# Patient Record
Sex: Male | Born: 1970 | Race: Black or African American | Hispanic: No | Marital: Single | Smoking: Former smoker
Health system: Southern US, Community
[De-identification: ages and names within clinical notes are randomized; demographics above are authoritative.]

## PROBLEM LIST (undated history)

## (undated) DIAGNOSIS — C7800 Secondary malignant neoplasm of unspecified lung: Secondary | ICD-10-CM

## (undated) DIAGNOSIS — C642 Malignant neoplasm of left kidney, except renal pelvis: Secondary | ICD-10-CM

## (undated) HISTORY — PX: APPENDECTOMY: SHX54

---

## 1998-05-24 ENCOUNTER — Emergency Department (HOSPITAL_COMMUNITY): Admission: EM | Admit: 1998-05-24 | Discharge: 1998-05-24 | Payer: Self-pay | Admitting: Emergency Medicine

## 1999-01-30 ENCOUNTER — Emergency Department (HOSPITAL_COMMUNITY): Admission: EM | Admit: 1999-01-30 | Discharge: 1999-01-30 | Payer: Self-pay | Admitting: Emergency Medicine

## 1999-07-16 ENCOUNTER — Emergency Department (HOSPITAL_COMMUNITY): Admission: EM | Admit: 1999-07-16 | Discharge: 1999-07-16 | Payer: Self-pay | Admitting: Emergency Medicine

## 2000-10-01 ENCOUNTER — Other Ambulatory Visit: Admission: RE | Admit: 2000-10-01 | Discharge: 2000-10-01 | Payer: Self-pay | Admitting: Urology

## 2000-10-01 ENCOUNTER — Encounter (INDEPENDENT_AMBULATORY_CARE_PROVIDER_SITE_OTHER): Payer: Self-pay

## 2003-08-20 ENCOUNTER — Emergency Department (HOSPITAL_COMMUNITY): Admission: EM | Admit: 2003-08-20 | Discharge: 2003-08-20 | Payer: Self-pay | Admitting: Emergency Medicine

## 2003-08-22 ENCOUNTER — Emergency Department (HOSPITAL_COMMUNITY): Admission: EM | Admit: 2003-08-22 | Discharge: 2003-08-23 | Payer: Self-pay | Admitting: Emergency Medicine

## 2003-08-23 IMAGING — CR DG CHEST 2V
2 series · 2 of 2 positions shown · non-contrast
Comparison: None.

CLINICAL DATA: Chest pain, back pain.
 TWO VIEW CHEST ? [DATE]

[view not recorded (1 of 2)]
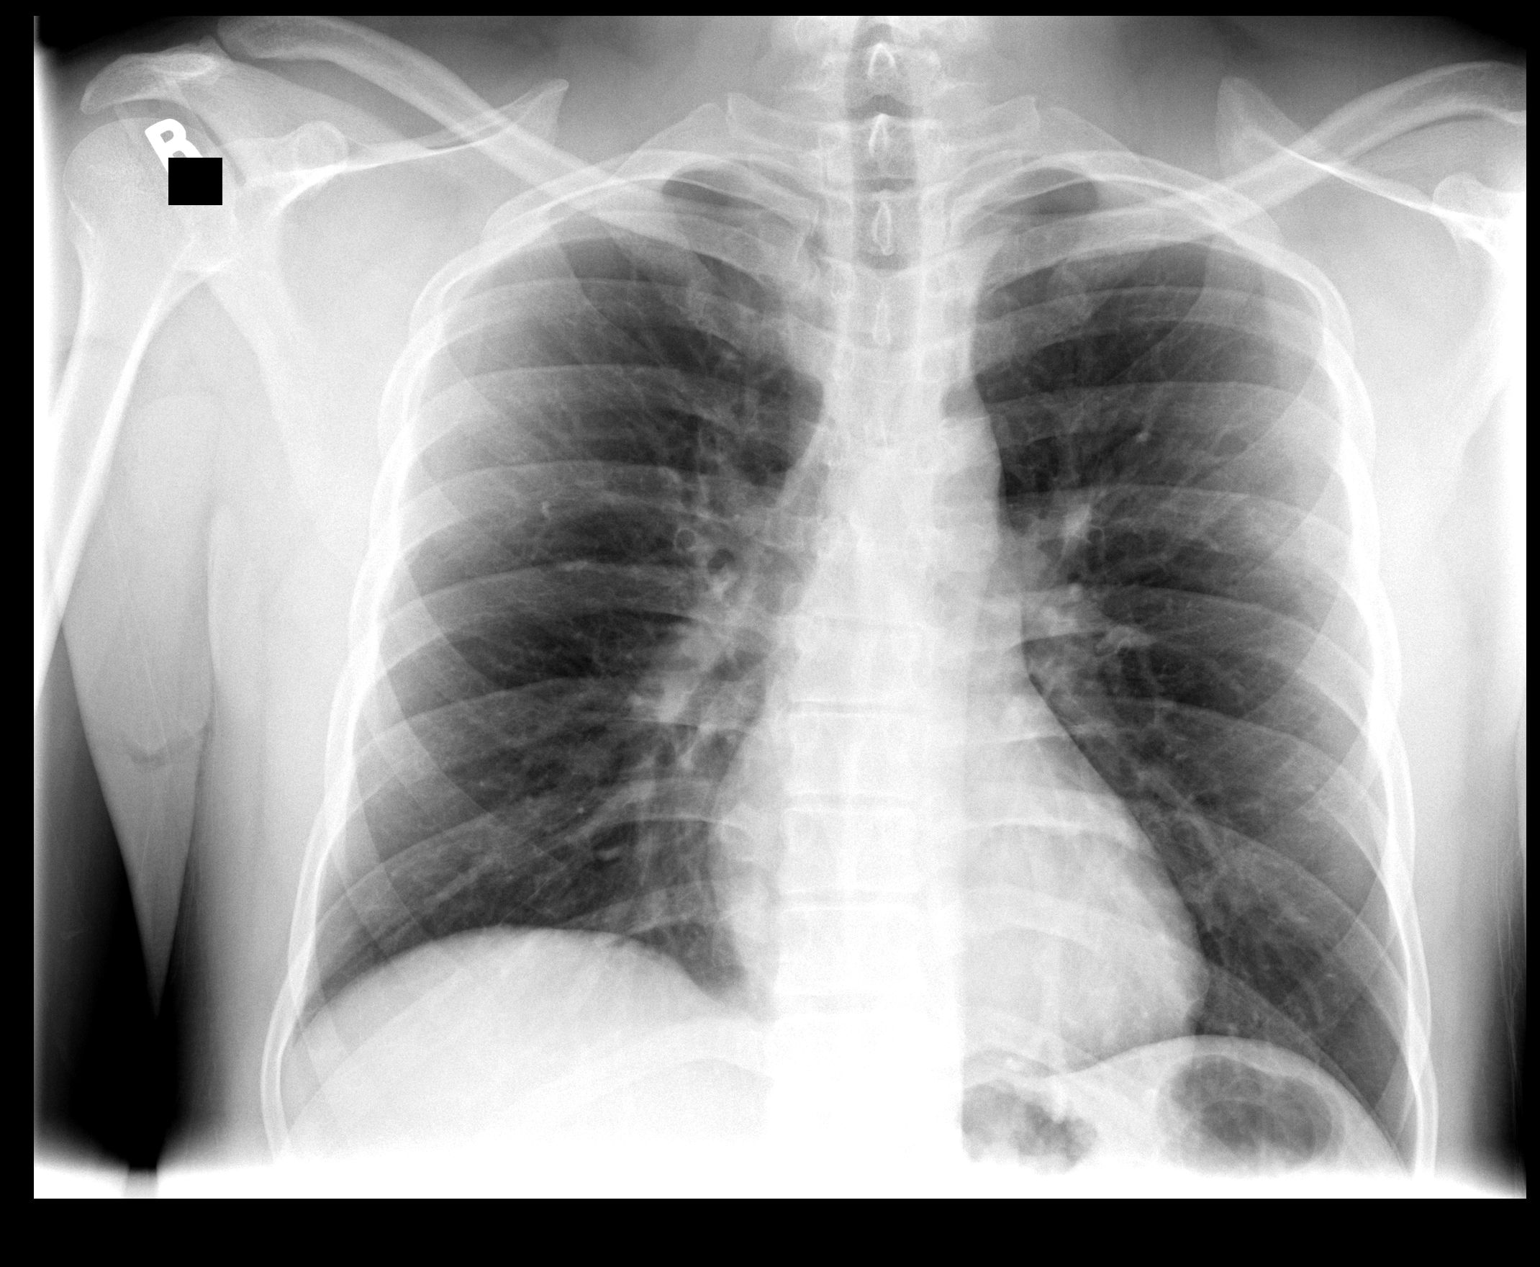

[view not recorded (2 of 2)]
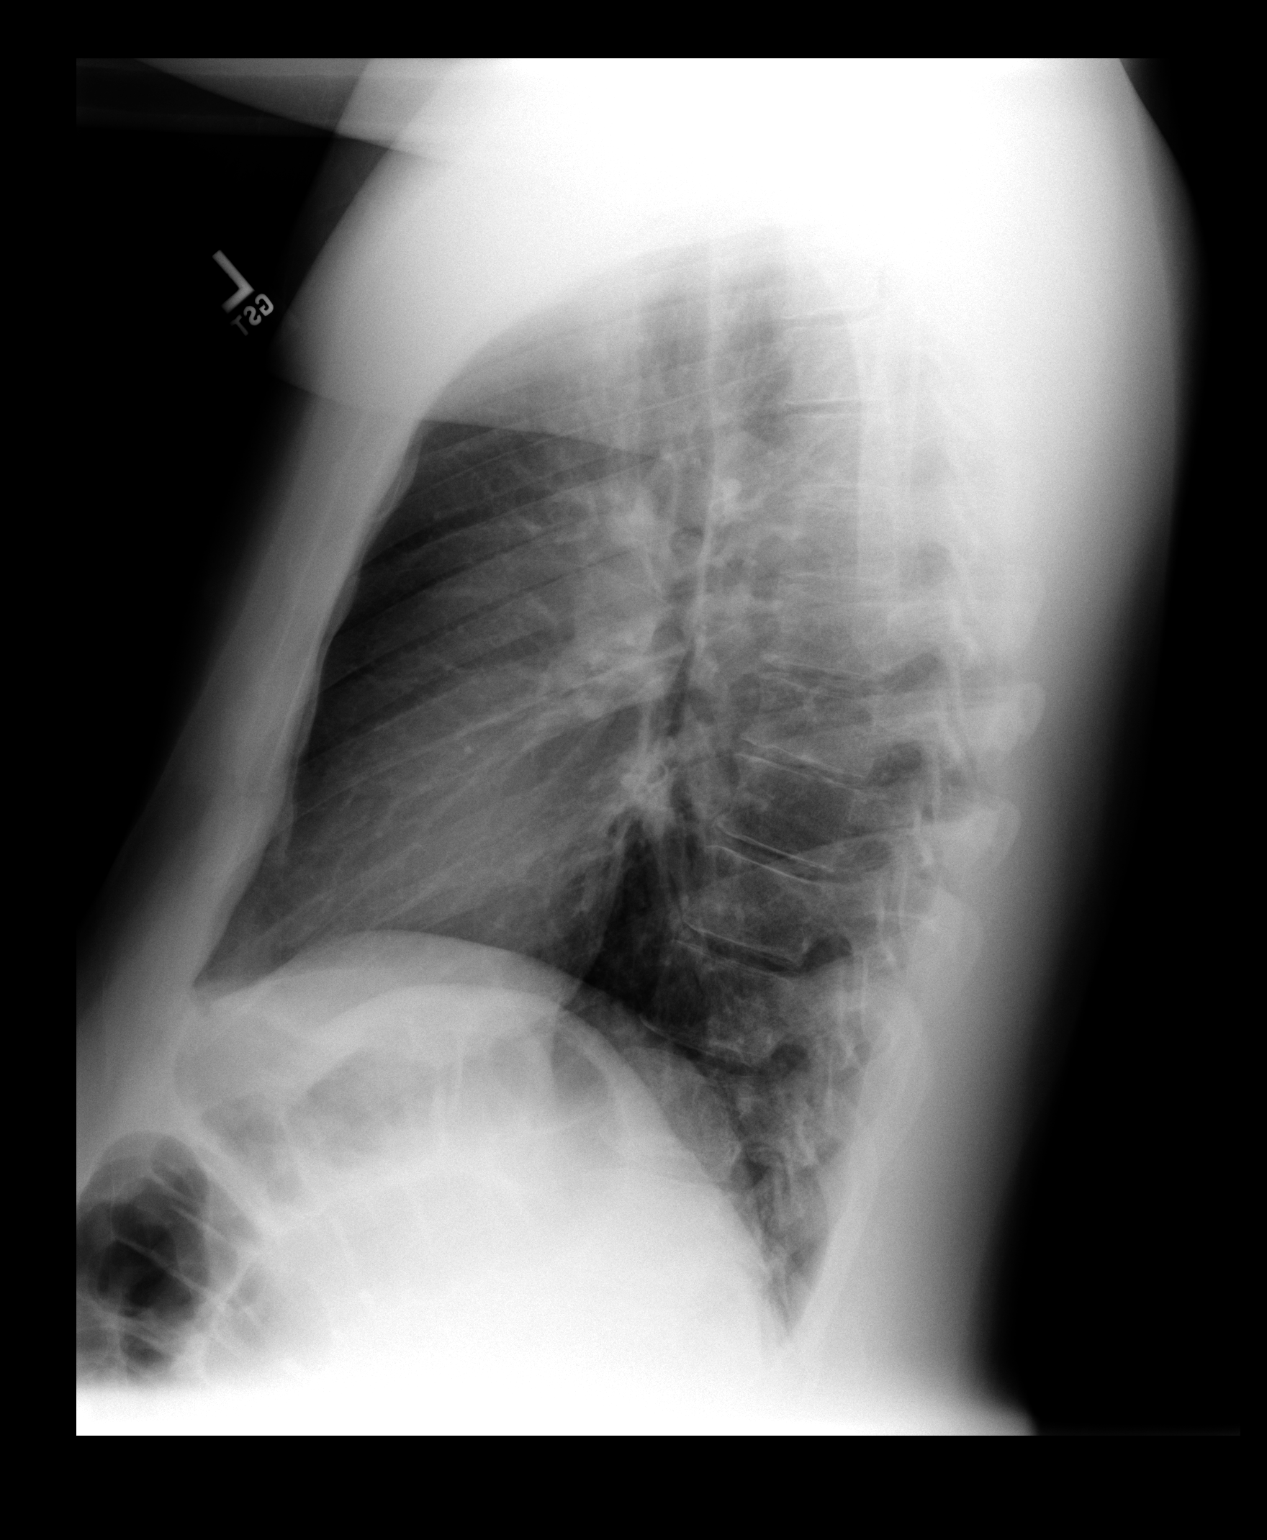

[2 of 2 positions shown; findings below may reference images not displayed]

The heart size and mediastinal contours are normal. The lungs are clear. The visualized skeleton is unremarkable.

 IMPRESSION
 No active disease.

## 2004-02-25 ENCOUNTER — Emergency Department (HOSPITAL_COMMUNITY): Admission: EM | Admit: 2004-02-25 | Discharge: 2004-02-25 | Payer: Self-pay | Admitting: *Deleted

## 2004-08-08 ENCOUNTER — Emergency Department (HOSPITAL_COMMUNITY): Admission: EM | Admit: 2004-08-08 | Discharge: 2004-08-08 | Payer: Self-pay | Admitting: Family Medicine

## 2005-05-13 ENCOUNTER — Emergency Department (HOSPITAL_COMMUNITY): Admission: EM | Admit: 2005-05-13 | Discharge: 2005-05-13 | Payer: Self-pay | Admitting: Emergency Medicine
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
## 2007-05-24 ENCOUNTER — Emergency Department (HOSPITAL_COMMUNITY): Admission: EM | Admit: 2007-05-24 | Discharge: 2007-05-24 | Payer: Self-pay | Admitting: Emergency Medicine

## 2011-12-23 ENCOUNTER — Emergency Department (HOSPITAL_COMMUNITY)
Admission: EM | Admit: 2011-12-23 | Discharge: 2011-12-23 | Disposition: A | Discharge status: Home / Self Care | Payer: Self-pay | Attending: Emergency Medicine | Admitting: Emergency Medicine

## 2011-12-23 ENCOUNTER — Emergency Department (HOSPITAL_COMMUNITY): Payer: Self-pay

## 2011-12-23 ENCOUNTER — Encounter (HOSPITAL_COMMUNITY): Payer: Self-pay | Admitting: *Deleted

## 2011-12-23 DIAGNOSIS — F17200 Nicotine dependence, unspecified, uncomplicated: Secondary | ICD-10-CM | POA: Insufficient documentation

## 2011-12-23 DIAGNOSIS — R059 Cough, unspecified: Secondary | ICD-10-CM | POA: Insufficient documentation

## 2011-12-23 DIAGNOSIS — R05 Cough: Secondary | ICD-10-CM | POA: Insufficient documentation

## 2011-12-23 DIAGNOSIS — J069 Acute upper respiratory infection, unspecified: Secondary | ICD-10-CM

## 2011-12-23 IMAGING — CR DG CHEST 2V
2 series · 2 of 2 positions shown · non-contrast
Comparison: [DATE]

CLINICAL DATA: Cough, smoker.

CHEST - 2 VIEW

[w chest pa]
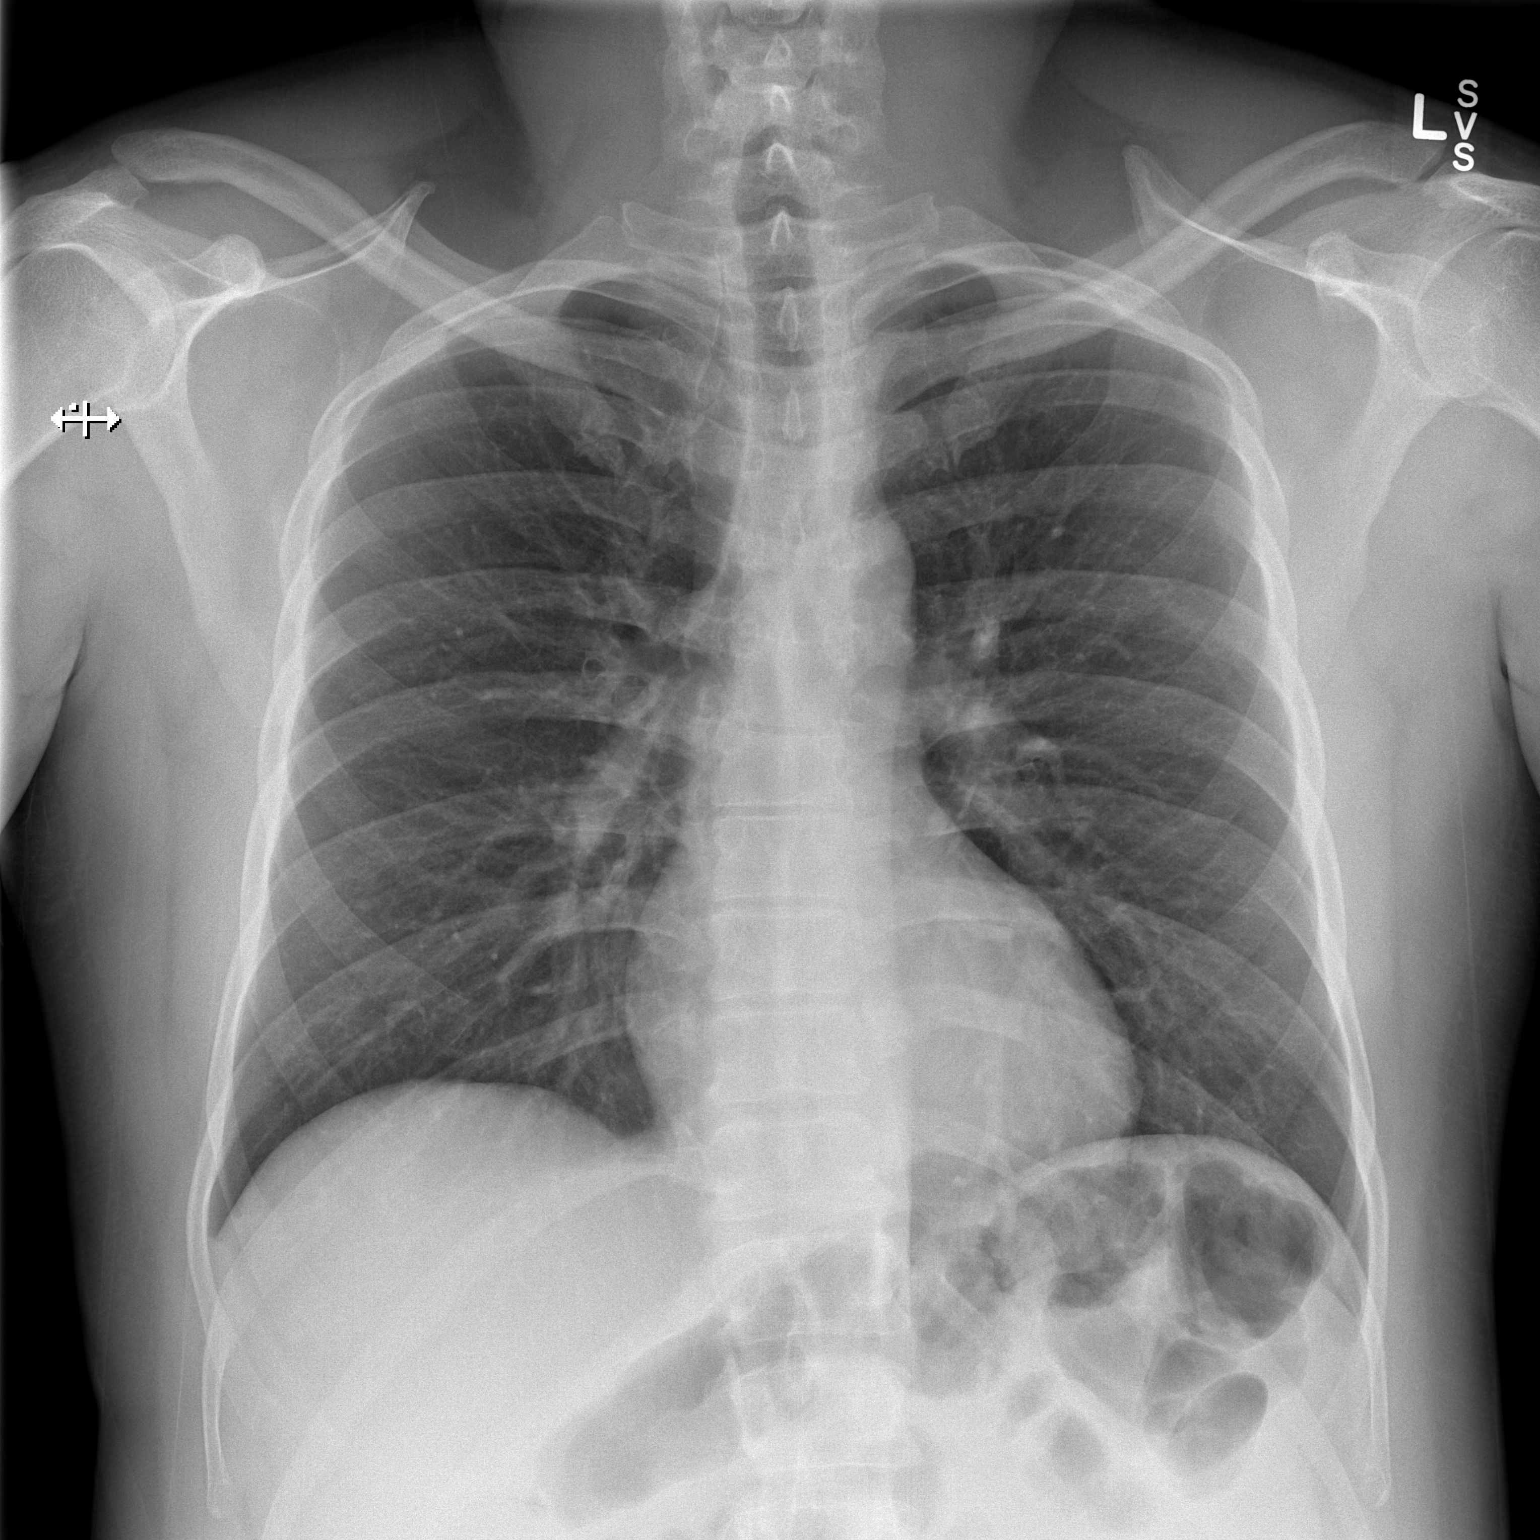

[w chest lat]
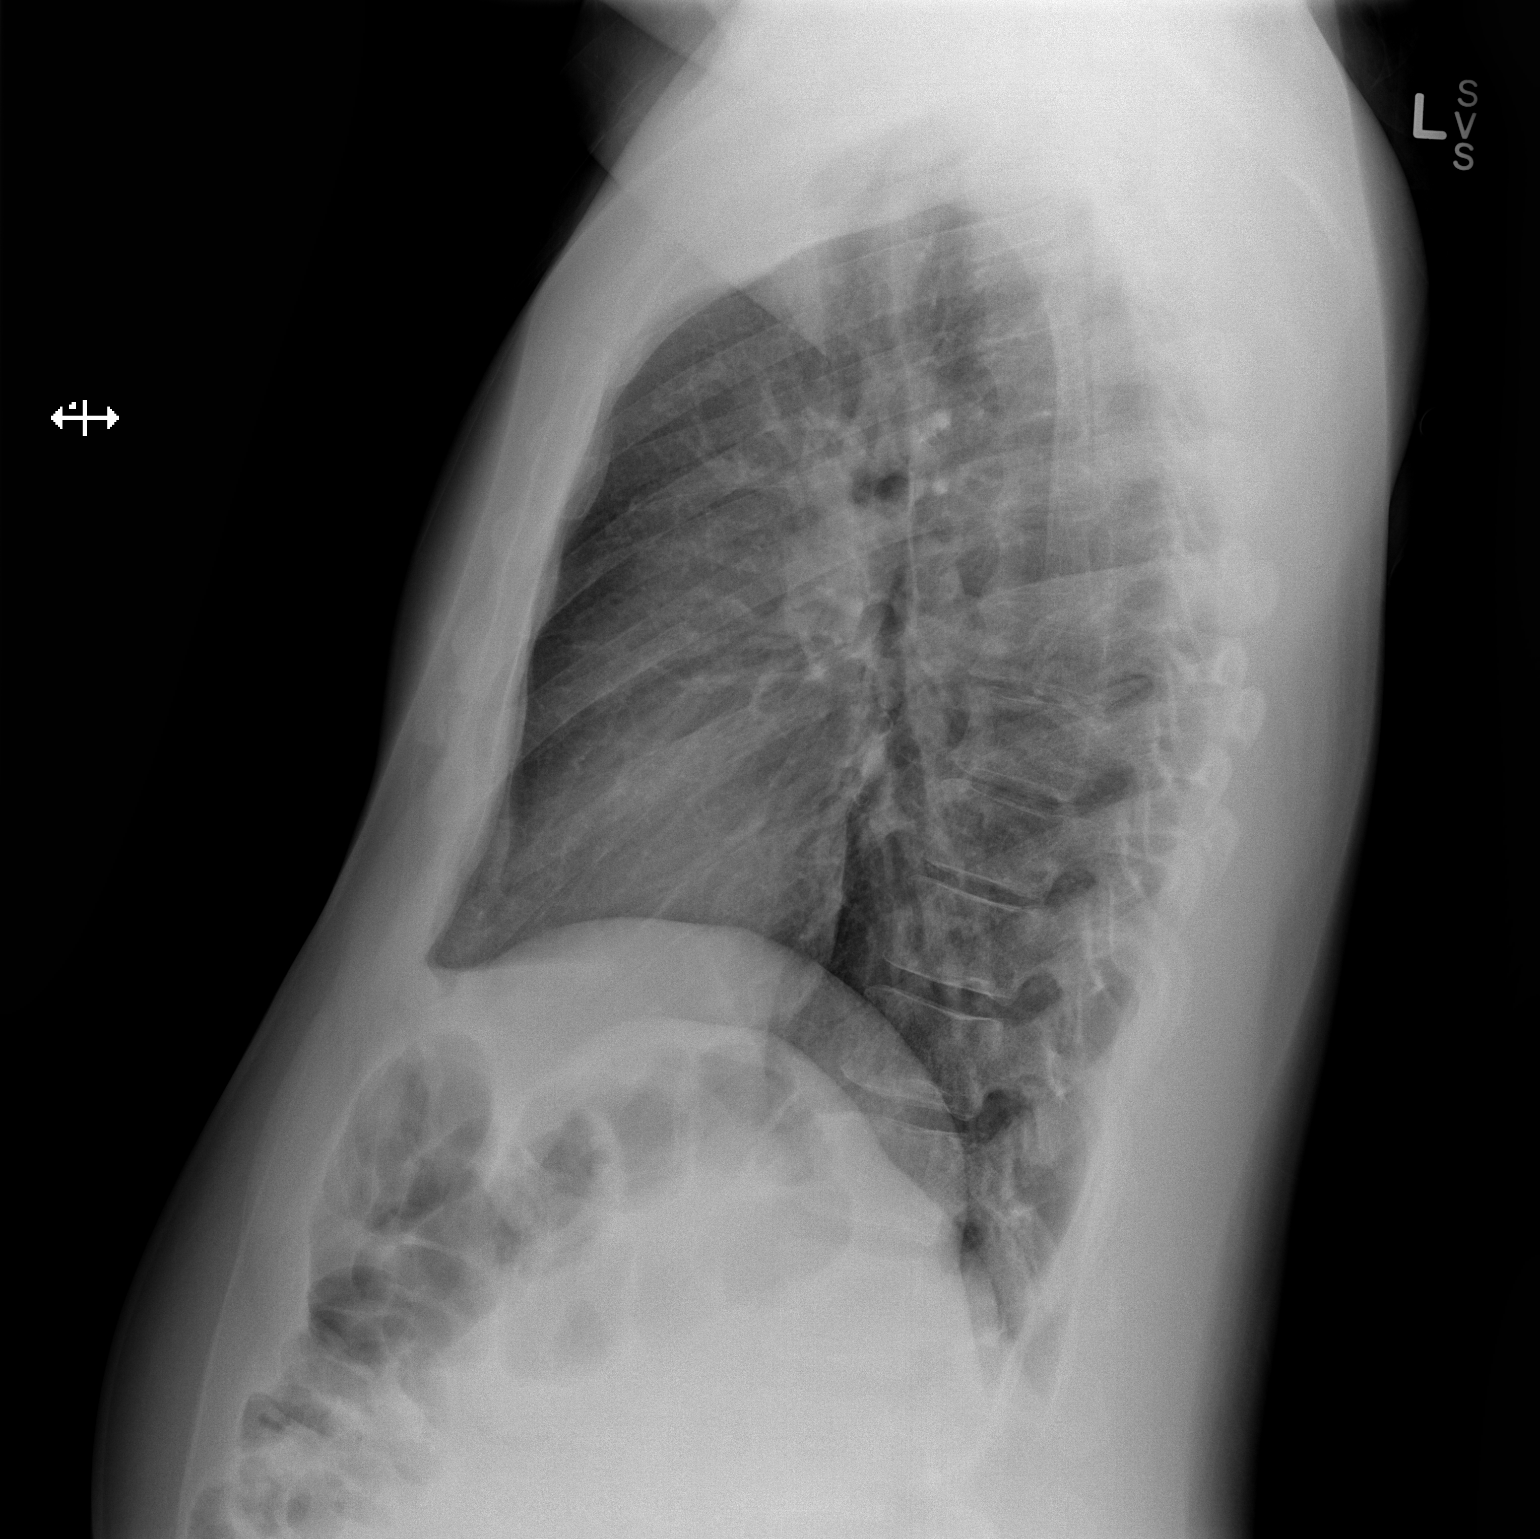

[2 of 2 positions shown; findings below may reference images not displayed]

FINDINGS: Heart and mediastinal contours are within normal limits.
No focal opacities or effusions.  No acute bony abnormality.
IMPRESSION: No active cardiopulmonary disease.

## 2011-12-23 MED ORDER — HYDROCOD POLST-CHLORPHEN POLST 10-8 MG/5ML PO LQCR: 5.0000 mL | Freq: Two times a day (BID) | ORAL | Status: DC | PRN

## 2011-12-23 NOTE — ED Notes (Signed)
Per reports cough x4-5, clear sputum, denies fever - pt admits to hx of smoking and bronchitis. LS clear and equal bilat. Pt also admits to rib pain, describes as sore w/ coughing.

## 2011-12-23 NOTE — Discharge Instructions (Signed)
Antibiotic Nonuse  Your caregiver felt that the infection or problem was not one that would be helped with an antibiotic. Infections may be caused by viruses or bacteria. Only a caregiver can tell which one of these is the likely cause of an illness. A cold is the most common cause of infection in both adults and children. A cold is a virus. Antibiotic treatment will have no effect on a viral infection. Viruses can lead to many lost days of work caring for sick children and many missed days of school. Children may catch as many as 10 "colds" or "flus" per year during which they can be tearful, cranky, and uncomfortable. The goal of treating a virus is aimed at keeping the ill person comfortable. Antibiotics are medications used to help the body fight bacterial infections. There are relatively few types of bacteria that cause infections but there are hundreds of viruses. While both viruses and bacteria cause infection they are very different types of germs. A viral infection will typically go away by itself within 7 to 10 days. Bacterial infections may spread or get worse without antibiotic treatment. Examples of bacterial infections are:  Sore throats (like strep throat or tonsillitis).   Infection in the lung (pneumonia).   Ear and skin infections.  Examples of viral infections are:  Colds or flus.   Most coughs and bronchitis.   Sore throats not caused by Strep.   Runny noses.  It is often best not to take an antibiotic when a viral infection is the cause of the problem. Antibiotics can kill off the helpful bacteria that we have inside our body and allow harmful bacteria to start growing. Antibiotics can cause side effects such as allergies, nausea, and diarrhea without helping to improve the symptoms of the viral infection. Additionally, repeated uses of antibiotics can cause bacteria inside of our body to become resistant. That resistance can be passed onto harmful bacterial. The next time  you have an infection it may be harder to treat if antibiotics are used when they are not needed. Not treating with antibiotics allows our own immune system to develop and take care of infections more efficiently. Also, antibiotics will work better for us when they are prescribed for bacterial infections. Treatments for a child that is ill may include:  Give extra fluids throughout the day to stay hydrated.   Get plenty of rest.   Only give your child over-the-counter or prescription medicines for pain, discomfort, or fever as directed by your caregiver.   The use of a cool mist humidifier may help stuffy noses.   Cold medications if suggested by your caregiver.  Your caregiver may decide to start you on an antibiotic if:  The problem you were seen for today continues for a longer length of time than expected.   You develop a secondary bacterial infection.  SEEK MEDICAL CARE IF:  Fever lasts longer than 5 days.   Symptoms continue to get worse after 5 to 7 days or become severe.   Difficulty in breathing develops.   Signs of dehydration develop (poor drinking, rare urinating, dark colored urine).   Changes in behavior or worsening tiredness (listlessness or lethargy).  Document Released: 09/02/2001 Document Revised: 06/13/2011 Document Reviewed: 03/01/2009 [employer] Patient Information 2012 [employer].Upper Respiratory Infection, Adult An upper respiratory infection (URI) is also sometimes known as the common cold. The upper respiratory tract includes the nose, sinuses, throat, trachea, and bronchi. Bronchi are the airways leading to the lungs. Most   people improve within 1 week, but symptoms can last up to 2 weeks. A residual cough may last even longer.  CAUSES Many different viruses can infect the tissues lining the upper respiratory tract. The tissues become irritated and inflamed and often become very moist. Mucus production is also common. A cold is contagious. You can easily  spread the virus to others by oral contact. This includes kissing, sharing a glass, coughing, or sneezing. Touching your mouth or nose and then touching a surface, which is then touched by another person, can also spread the virus. SYMPTOMS  Symptoms typically develop 1 to 3 days after you come in contact with a cold virus. Symptoms vary from person to person. They may include:  Runny nose.   Sneezing.   Nasal congestion.   Sinus irritation.   Sore throat.   Loss of voice (laryngitis).   Cough.   Fatigue.   Muscle aches.   Loss of appetite.   Headache.   Low-grade fever.  DIAGNOSIS  You might diagnose your own cold based on familiar symptoms, since most people get a cold 2 to 3 times a year. Your caregiver can confirm this based on your exam. Most importantly, your caregiver can check that your symptoms are not due to another disease such as strep throat, sinusitis, pneumonia, asthma, or epiglottitis. Blood tests, throat tests, and X-rays are not necessary to diagnose a common cold, but they may sometimes be helpful in excluding other more serious diseases. Your caregiver will decide if any further tests are required. RISKS AND COMPLICATIONS  You may be at risk for a more severe case of the common cold if you smoke cigarettes, have chronic heart disease (such as heart failure) or lung disease (such as asthma), or if you have a weakened immune system. The very young and very old are also at risk for more serious infections. Bacterial sinusitis, middle ear infections, and bacterial pneumonia can complicate the common cold. The common cold can worsen asthma and chronic obstructive pulmonary disease (COPD). Sometimes, these complications can require emergency medical care and may be life-threatening. PREVENTION  The best way to protect against getting a cold is to practice good hygiene. Avoid oral or hand contact with people with cold symptoms. Wash your hands often if contact occurs.  There is no clear evidence that vitamin C, vitamin E, echinacea, or exercise reduces the chance of developing a cold. However, it is always recommended to get plenty of rest and practice good nutrition. TREATMENT  Treatment is directed at relieving symptoms. There is no cure. Antibiotics are not effective, because the infection is caused by a virus, not by bacteria. Treatment may include:  Increased fluid intake. Sports drinks offer valuable electrolytes, sugars, and fluids.   Breathing heated mist or steam (vaporizer or shower).   Eating chicken soup or other clear broths, and maintaining good nutrition.   Getting plenty of rest.   Using gargles or lozenges for comfort.   Controlling fevers with ibuprofen or acetaminophen as directed by your caregiver.   Increasing usage of your inhaler if you have asthma.  Zinc gel and zinc lozenges, taken in the first 24 hours of the common cold, can shorten the duration and lessen the severity of symptoms. Pain medicines may help with fever, muscle aches, and throat pain. A variety of non-prescription medicines are available to treat congestion and runny nose. Your caregiver can make recommendations and may suggest nasal or lung inhalers for other symptoms.  HOME CARE INSTRUCTIONS     Only take over-the-counter or prescription medicines for pain, discomfort, or fever as directed by your caregiver.   Use a warm mist humidifier or inhale steam from a shower to increase air moisture. This may keep secretions moist and make it easier to breathe.   Drink enough water and fluids to keep your urine clear or pale yellow.   Rest as needed.   Return to work when your temperature has returned to normal or as your caregiver advises. You may need to stay home longer to avoid infecting others. You can also use a face mask and careful hand washing to prevent spread of the virus.  SEEK MEDICAL CARE IF:   After the first few days, you feel you are getting worse  rather than better.   You need your caregiver's advice about medicines to control symptoms.   You develop chills, worsening shortness of breath, or brown or red sputum. These may be signs of pneumonia.   You develop yellow or brown nasal discharge or pain in the face, especially when you bend forward. These may be signs of sinusitis.   You develop a fever, swollen neck glands, pain with swallowing, or white areas in the back of your throat. These may be signs of strep throat.  SEEK IMMEDIATE MEDICAL CARE IF:   You have a fever.   You develop severe or persistent headache, ear pain, sinus pain, or chest pain.   You develop wheezing, a prolonged cough, cough up blood, or have a change in your usual mucus (if you have chronic lung disease).   You develop sore muscles or a stiff neck.  Document Released: 12/18/2000 Document Revised: 06/13/2011 Document Reviewed: 10/26/2010 [employer] Patient Information 2012 [employer]. 

## 2011-12-23 NOTE — ED Provider Notes (Signed)
History     CSN: [id number]  Arrival date & time 12/23/11  1847   First MD Initiated Contact with Patient 12/23/11 2038      HPI Patient reports a nonproductive cough for 4 days. Reports no relief with over-the-counter medication. Denies fever, sore throat, nasal congestion, headache, shortness of breath. Reports coughing so hard that his ribs will hurt him. Denies constantly rib pain  Patient is a 41 y.o. male presenting with cough.  Cough This is a new problem. Episode onset: 4 days ago. The problem occurs constantly. The problem has not changed since onset.There has been no fever. Pertinent negatives include no chills, no headaches, no sore throat, no shortness of breath and no wheezing. He has tried decongestants for the symptoms. The treatment provided no relief. His past medical history does not include asthma.    History reviewed. No pertinent past medical history.  Past Surgical History  Procedure Date  . Appendectomy     No family history on file.  History  Substance Use Topics  . Smoking status: Current Some Day Smoker  . Smokeless tobacco: Not on file  . Alcohol Use:       Review of Systems  Constitutional: Negative for fever, chills and fatigue.  HENT: Negative for congestion, sore throat, trouble swallowing and neck pain.   Respiratory: Positive for cough. Negative for shortness of breath and wheezing.   Gastrointestinal: Negative for nausea, vomiting and abdominal pain.  Skin: Negative for rash.  Neurological: Negative for weakness and headaches.  All other systems reviewed and are negative.    Allergies  Review of patient's allergies indicates no known allergies.  Home Medications   Current Outpatient Rx  Name Route Sig Dispense Refill  . OVER THE COUNTER MEDICATION Oral Take 15 mLs by mouth once as needed. Cough syrup. For cough.      BP 141/81  Pulse 87  Temp 99.2 F (37.3 C) (Oral)  Resp 18  Ht 5\' 9"  (1.753 m)  SpO2 99%  Physical Exam    Constitutional: He is oriented to person, place, and time. He appears well-developed and well-nourished.  HENT:  Head: Normocephalic and atraumatic.  Right Ear: External ear normal.  Left Ear: External ear normal.  Nose: Nose normal.  Mouth/Throat: Oropharynx is clear and moist. No oropharyngeal exudate.  Eyes: Conjunctivae are normal. Pupils are equal, round, and reactive to light.  Neck: Normal range of motion. Neck supple.  Cardiovascular: Normal rate, regular rhythm and normal heart sounds.  Exam reveals no friction rub.   No murmur heard. Pulmonary/Chest: Effort normal and breath sounds normal. No respiratory distress. He has no wheezes. He has no rales. He exhibits no tenderness.  Abdominal: Soft. Bowel sounds are normal.  Lymphadenopathy:    He has no cervical adenopathy.  Neurological: He is alert and oriented to person, place, and time.  Skin: Skin is warm and dry. No rash noted. No erythema. No pallor.  Psychiatric: He has a normal mood and affect. His behavior is normal.    ED Course  Procedures   Dg Chest 2 View  12/23/2011  *RADIOLOGY REPORT*  Clinical Data: Cough, smoker.  CHEST - 2 VIEW  Comparison: 08/23/2003  Findings: Heart and mediastinal contours are within normal limits. No focal opacities or effusions.  No acute bony abnormality.  IMPRESSION: No active cardiopulmonary disease.  Original Report Authenticated By: Cyndie Chime, M.D.       MDM    Will prescribe patient Tussionex. Patient currently left  without receiving medication. Had expressed a meal in the emergency department that he was hoping to receive antibiotics. I explained to patient that chest x-ray was negative for pneumonia which is a bacterial infection. Advised that majority of upper respiratory infections or viral and require symptomatic treatment. Patient had initially voice understanding but apparently left without receiving Tussionex decongestant.    Thomasene Lot, PA-C 12/24/11 0009

## 2011-12-24 NOTE — ED Provider Notes (Signed)
Medical screening examination/treatment/procedure(s) were performed by non-physician practitioner and as supervising physician I was immediately available for consultation/collaboration.   Gloyd Happ M Ebony Rickel, MD 12/24/11 2302 

## 2015-04-03 ENCOUNTER — Emergency Department (HOSPITAL_COMMUNITY)
Admission: EM | Admit: 2015-04-03 | Discharge: 2015-04-03 | Disposition: A | Discharge status: Home / Self Care | Payer: Self-pay | Attending: Emergency Medicine | Admitting: Emergency Medicine

## 2015-04-03 ENCOUNTER — Encounter (HOSPITAL_COMMUNITY): Payer: Self-pay | Admitting: Family Medicine

## 2015-04-03 DIAGNOSIS — J069 Acute upper respiratory infection, unspecified: Secondary | ICD-10-CM | POA: Insufficient documentation

## 2015-04-03 DIAGNOSIS — H9201 Otalgia, right ear: Secondary | ICD-10-CM | POA: Insufficient documentation

## 2015-04-03 DIAGNOSIS — H6121 Impacted cerumen, right ear: Secondary | ICD-10-CM | POA: Insufficient documentation

## 2015-04-03 DIAGNOSIS — Z720 Tobacco use: Secondary | ICD-10-CM | POA: Insufficient documentation

## 2015-04-03 LAB — RAPID STREP SCREEN (MED CTR MEBANE ONLY): Streptococcus, Group A Screen (Direct): NEGATIVE

## 2015-04-03 NOTE — ED Notes (Signed)
Pt here for right ear pain and sore throat x 1 week.

## 2015-04-03 NOTE — Discharge Instructions (Signed)
Read the information below.  You may return to the Emergency Department at any time for worsening condition or any new symptoms that concern you.  If you develop high fevers that do not resolve with tylenol or ibuprofen, you have difficulty swallowing or breathing, or you are unable to tolerate fluids by mouth, return to the ER for a recheck.      Upper Respiratory Infection, Adult An upper respiratory infection (URI) is also known as the common cold. It is often caused by a type of germ (virus). Colds are easily spread (contagious). You can pass it to others by kissing, coughing, sneezing, or drinking out of the same glass. Usually, you get better in 1 or 2 weeks.  HOME CARE   Only take medicine as told by your doctor.  Use a warm mist humidifier or breathe in steam from a hot shower.  Drink enough water and fluids to keep your pee (urine) clear or pale yellow.  Get plenty of rest.  Return to work when your temperature is back to normal or as told by your doctor. You may use a face mask and wash your hands to stop your cold from spreading. GET HELP RIGHT AWAY IF:   After the first few days, you feel you are getting worse.  You have questions about your medicine.  You have chills, shortness of breath, or brown or red spit (mucus).  You have yellow or brown snot (nasal discharge) or pain in the face, especially when you bend forward.  You have a fever, puffy (swollen) neck, pain when you swallow, or white spots in the back of your throat.  You have a bad headache, ear pain, sinus pain, or chest pain.  You have a high-pitched whistling sound when you breathe in and out (wheezing).  You have a lasting cough or cough up blood.  You have sore muscles or a stiff neck. MAKE SURE YOU:   Understand these instructions.  Will watch your condition.  Will get help right away if you are not doing well or get worse. Document Released: 12/11/2007 Document Revised: 09/16/2011 Document  Reviewed: 09/29/2013 [facility] Patient Information 2015 [place], [place]. This information is not intended to replace advice given to you by your health care provider. Make sure you discuss any questions you have with your health care provider.

## 2015-04-03 NOTE — ED Notes (Signed)
Declined W/C at D/C and was escorted to lobby by RN. 

## 2015-04-03 NOTE — ED Provider Notes (Signed)
CSN: [id number]     Arrival date & time 04/03/15  7673 History  This chart was scribed for non-physician practitioner, Clayton Bibles, PA-C, working with Evelina Bucy, MD by Terressa Koyanagi, ED Scribe. This patient was seen in room TR05C/TR05C and the patient's care was started at 9:41 AM.   Chief Complaint  Patient presents with  . Sore Throat  . Otalgia   The history is provided by the patient.   PCP: No primary care provider on file. HPI Comments: Dylan Good is a 44 y.o. male who presents to the Emergency Department complaining of right ear pain onset one week ago and associated sore throat onset 2 days ago. Pt confirms sick contact and reports a mild cough. Pt denies fever, rhinorrhea, nasal congestion, SOB, chest pain, or left ear pain.  History reviewed. No pertinent past medical history. Past Surgical History  Procedure Laterality Date  . Appendectomy     History reviewed. No pertinent family history. Social History  Substance Use Topics  . Smoking status: Current Some Day Smoker  . Smokeless tobacco: None  . Alcohol Use: None    Review of Systems  Constitutional: Negative for fever and chills.  HENT: Positive for ear pain. Negative for congestion, rhinorrhea and trouble swallowing.   Respiratory: Positive for cough. Negative for shortness of breath.   Cardiovascular: Negative for chest pain.  Musculoskeletal: Negative for myalgias, neck pain and neck stiffness.  Skin: Negative for rash.  Allergic/Immunologic: Negative for immunocompromised state.  Hematological: Does not bruise/bleed easily.  Psychiatric/Behavioral: Negative for self-injury.   Allergies  Review of patient's allergies indicates no known allergies.  Home Medications   Prior to Admission medications   Medication Sig Start Date End Date Taking? Authorizing Provider  chlorpheniramine-HYDROcodone (TUSSIONEX PENNKINETIC ER) 10-8 MG/5ML LQCR Take 5 mLs by mouth every 12 (twelve) hours as needed. 12/23/11    Brigitte Alfonse Spruce, PA-C  OVER THE COUNTER MEDICATION Take 15 mLs by mouth once as needed. Cough syrup. For cough.    Historical Provider, MD   Triage Vitals: BP 128/91 mmHg  Pulse 80  Temp(Src) 98.5 F (36.9 C) (Oral)  Resp 17  Ht '5\' 9"'$  (1.753 m)  Wt 215 lb (97.523 kg)  BMI 31.74 kg/m2  SpO2 98% Physical Exam  Constitutional: He appears well-developed and well-nourished. No distress.  HENT:  Head: Normocephalic and atraumatic.  Left Ear: Tympanic membrane and external ear normal.  Mouth/Throat: Oropharynx is clear and moist. No oropharyngeal exudate.  Unable to visualize right TM due to excessive cerumen.   Eyes: Conjunctivae and EOM are normal. Right eye exhibits no discharge. Left eye exhibits no discharge.  Neck: Normal range of motion. Neck supple.  Cardiovascular: Normal rate and regular rhythm.   Pulmonary/Chest: Effort normal and breath sounds normal. No stridor. No respiratory distress. He has no wheezes. He has no rales.  Lymphadenopathy:    He has no cervical adenopathy.  Neurological: He is alert.  Skin: He is not diaphoretic.  Nursing note and vitals reviewed. After irrigation: right ear normal.  TM normal.  Ear canal clear.  No pain with palpation of tragus or traction on pinna.   ED Course  Procedures (including critical care time) DIAGNOSTIC STUDIES: Oxygen Saturation is 98% on RA, nl by my interpretation.    COORDINATION OF CARE: 9:45 AM: Discussed treatment plan with pt at bedside; patient verbalizes understanding and agrees with treatment plan.  Labs Review Labs Reviewed  RAPID STREP SCREEN (NOT AT [facility])  CULTURE, GROUP A  STREP   I have personally reviewed and evaluated these lab results as part of my medical decision-making.    MDM   Final diagnoses:  URI (upper respiratory infection)    Afebrile, nontoxic patient with constellation of symptoms suggestive of viral syndrome.  No concerning findings on exam.  Discharged home with supportive care,  PCP follow up.  Discussed result, findings, treatment, and follow up  with patient.  Pt given return precautions.  Pt verbalizes understanding and agrees with plan.       I personally performed the services described in this documentation, which was scribed in my presence. The recorded information has been reviewed and is accurate.    Clayton Bibles, PA-C 04/03/15 1633  Evelina Bucy, MD 04/04/15 [phone]

## 2015-04-05 LAB — CULTURE, GROUP A STREP: Strep A Culture: NEGATIVE

## 2017-09-29 ENCOUNTER — Emergency Department (HOSPITAL_COMMUNITY)
Admission: EM | Admit: 2017-09-29 | Discharge: 2017-09-29 | Disposition: A | Discharge status: Home / Self Care | Payer: Self-pay | Attending: Emergency Medicine | Admitting: Emergency Medicine

## 2017-09-29 ENCOUNTER — Other Ambulatory Visit: Payer: Self-pay

## 2017-09-29 ENCOUNTER — Encounter (HOSPITAL_COMMUNITY): Payer: Self-pay | Admitting: Emergency Medicine

## 2017-09-29 DIAGNOSIS — Z23 Encounter for immunization: Secondary | ICD-10-CM | POA: Insufficient documentation

## 2017-09-29 DIAGNOSIS — S61511A Laceration without foreign body of right wrist, initial encounter: Secondary | ICD-10-CM | POA: Insufficient documentation

## 2017-09-29 DIAGNOSIS — Z79899 Other long term (current) drug therapy: Secondary | ICD-10-CM | POA: Insufficient documentation

## 2017-09-29 DIAGNOSIS — S51811A Laceration without foreign body of right forearm, initial encounter: Secondary | ICD-10-CM | POA: Insufficient documentation

## 2017-09-29 DIAGNOSIS — Y998 Other external cause status: Secondary | ICD-10-CM | POA: Insufficient documentation

## 2017-09-29 DIAGNOSIS — W25XXXA Contact with sharp glass, initial encounter: Secondary | ICD-10-CM | POA: Insufficient documentation

## 2017-09-29 DIAGNOSIS — T07XXXA Unspecified multiple injuries, initial encounter: Secondary | ICD-10-CM | POA: Insufficient documentation

## 2017-09-29 DIAGNOSIS — Y929 Unspecified place or not applicable: Secondary | ICD-10-CM | POA: Insufficient documentation

## 2017-09-29 DIAGNOSIS — Y939 Activity, unspecified: Secondary | ICD-10-CM | POA: Insufficient documentation

## 2017-09-29 DIAGNOSIS — F17210 Nicotine dependence, cigarettes, uncomplicated: Secondary | ICD-10-CM | POA: Insufficient documentation

## 2017-09-29 DIAGNOSIS — S61411A Laceration without foreign body of right hand, initial encounter: Secondary | ICD-10-CM | POA: Insufficient documentation

## 2017-09-29 MED ORDER — LIDOCAINE-EPINEPHRINE (PF) 2 %-1:200000 IJ SOLN
20.0000 mL | Freq: Once | INTRAMUSCULAR | Status: AC
  Administered 2017-09-29: 20 mL
  Filled 2017-09-29: qty 20

## 2017-09-29 MED ORDER — TETANUS-DIPHTH-ACELL PERTUSSIS 5-2.5-18.5 LF-MCG/0.5 IM SUSP
0.5000 mL | Freq: Once | INTRAMUSCULAR | Status: AC
  Administered 2017-09-29: 0.5 mL via INTRAMUSCULAR
  Filled 2017-09-29: qty 0.5

## 2017-09-29 MED ORDER — CEPHALEXIN 500 MG PO CAPS: 500.0000 mg | ORAL_CAPSULE | Freq: Four times a day (QID) | ORAL | 0 refills | Status: DC

## 2017-09-29 NOTE — ED Provider Notes (Signed)
[facility] DEPT Provider Note   CSN: [id number] Arrival date & time: 09/29/17  0115     History   Chief Complaint Chief Complaint  Patient presents with  . Laceration    HPI Dylan Good is a 47 y.o. male with a hx of major medical problems presents to the Emergency Department complaining of acute, persistent lacerations to the right hand, wrist and forearm onset just prior to arrival.  Patient reports he was trying to open a screen door when the glass broke and cut his hand.  No treatments prior to arrival.  Patient denies numbness, tingling, weakness.  Unknown last tetanus.  Hemostasis achieved prior to arrival.  No aggravating or alleviating factors.   The history is provided by the patient and medical records. No language interpreter was used.    History reviewed. No pertinent past medical history.  There are no active problems to display for this patient.   Past Surgical History:  Procedure Laterality Date  . APPENDECTOMY          Home Medications    Prior to Admission medications   Medication Sig Start Date End Date Taking? Authorizing Provider  cephALEXin (KEFLEX) 500 MG capsule Take 1 capsule (500 mg total) by mouth 4 (four) times daily. 09/29/17   Rubyann Lingle, Jarrett Soho, PA-C  chlorpheniramine-HYDROcodone (TUSSIONEX PENNKINETIC ER) 10-8 MG/5ML LQCR Take 5 mLs by mouth every 12 (twelve) hours as needed. 12/23/11   Sheliah Mends, PA-C  OVER THE COUNTER MEDICATION Take 15 mLs by mouth once as needed. Cough syrup. For cough.    [provider]    Family History History reviewed. No pertinent family history.  Social History Social History   Tobacco Use  . Smoking status: Current Some Day Smoker    Packs/day: 0.50    Types: Cigarettes  . Smokeless tobacco: Never Used  Substance Use Topics  . Alcohol use: Never    Frequency: Never  . Drug use: Never     Allergies   Patient has no known allergies.   Review of  Systems Review of Systems  Constitutional: Negative for fever.  Gastrointestinal: Negative for nausea and vomiting.  Skin: Positive for wound.  Allergic/Immunologic: Negative for immunocompromised state.  Neurological: Negative for weakness and numbness.  Hematological: Does not bruise/bleed easily.  Psychiatric/Behavioral: The patient is not nervous/anxious.      Physical Exam Updated Vital Signs BP (!) 160/100 (BP Location: Left Arm)   Pulse 83   Temp 98.7 F (37.1 C) (Oral)   Resp 18   Ht 5\' 9"  (1.753 m)   Wt 93 kg (205 lb)   SpO2 99%   BMI 30.27 kg/m   Physical Exam  Constitutional: He is oriented to person, place, and time. He appears well-developed and well-nourished. No distress.  HENT:  Head: Normocephalic and atraumatic.  Eyes: Conjunctivae are normal. No scleral icterus.  Neck: Normal range of motion.  Cardiovascular: Normal rate, regular rhythm, normal heart sounds and intact distal pulses.  No murmur heard. Capillary refill < 3 sec  Pulmonary/Chest: Effort normal and breath sounds normal. No respiratory distress.  Musculoskeletal: Normal range of motion. He exhibits no edema.  Full range of motion of the right elbow, wrist and all fingers of the right hand  Neurological: He is alert and oriented to person, place, and time.  Sensation: Intact and normal touch in the right upper extremity Strength: 5/5 in the right upper extremity including grip strength  Skin: Skin is warm and dry.  He is not diaphoretic.  Two 17 cm superficial lacerations to the posterior portion of the right forearm.   2 cm laceration to the right wrist 4 cm laceration to the dorsum of the right hand Several additional abrasions noted to the forearm, wrist and hand  Psychiatric: He has a normal mood and affect.  Nursing note and vitals reviewed.    ED Treatments / Results   Procedures .Marland KitchenLaceration Repair Date/Time: 09/29/2017 4:44 AM Performed by: Abigail Butts,  PA-C Authorized by: Abigail Butts, PA-C   Consent:    Consent obtained:  Verbal   Consent given by:  Patient   Risks discussed:  Infection, poor cosmetic result, pain and poor wound healing   Alternatives discussed:  No treatment Anesthesia (see MAR for exact dosages):    Anesthesia method:  Local infiltration   Local anesthetic:  Lidocaine 2% WITH epi Laceration details:    Location:  Shoulder/arm   Shoulder/arm location:  R lower arm   Length (cm):  2 Repair type:    Repair type:  Simple Pre-procedure details:    Preparation:  Patient was prepped and draped in usual sterile fashion Exploration:    Hemostasis achieved with:  Epinephrine and direct pressure   Wound exploration: entire depth of wound probed and visualized   Treatment:    Area cleansed with:  Saline   Amount of cleaning:  Standard   Irrigation solution:  Sterile water   Irrigation method:  Syringe Skin repair:    Repair method:  Sutures   Suture size:  5-0   Suture material:  Prolene   Suture technique:  Simple interrupted   Number of sutures:  2 Approximation:    Approximation:  Close Post-procedure details:    Dressing:  Non-adherent dressing   Patient tolerance of procedure:  Tolerated well, no immediate complications .Marland KitchenLaceration Repair Date/Time: 09/29/2017 4:45 AM Performed by: Abigail Butts, PA-C Authorized by: Abigail Butts, PA-C   Consent:    Consent obtained:  Verbal   Consent given by:  Patient   Risks discussed:  Infection, pain, poor cosmetic result and poor wound healing   Alternatives discussed:  No treatment Anesthesia (see MAR for exact dosages):    Anesthesia method:  Local infiltration   Local anesthetic:  Lidocaine 2% WITH epi Laceration details:    Location:  Hand   Hand location:  R hand, dorsum   Length (cm):  4 Repair type:    Repair type:  Simple Pre-procedure details:    Preparation:  Patient was prepped and draped in usual sterile  fashion Exploration:    Hemostasis achieved with:  Epinephrine and direct pressure   Wound exploration: entire depth of wound probed and visualized   Treatment:    Area cleansed with:  Saline   Amount of cleaning:  Standard   Irrigation solution:  Sterile water   Irrigation method:  Syringe Skin repair:    Repair method:  Sutures   Suture size:  5-0   Suture material:  Prolene   Suture technique:  Simple interrupted   Number of sutures:  6 Approximation:    Approximation:  Close Post-procedure details:    Dressing:  Non-adherent dressing   Patient tolerance of procedure:  Tolerated well, no immediate complications   (including critical care time)  Medications Ordered in ED Medications  Tdap (BOOSTRIX) injection 0.5 mL (has no administration in time range)  lidocaine-EPINEPHrine (XYLOCAINE W/EPI) 2 %-1:200000 (PF) injection 20 mL (20 mLs Infiltration Given 09/29/17 0441)  Initial Impression / Assessment and Plan / ED Course  I have reviewed the triage vital signs and the nursing notes.  Pertinent labs & imaging results that were available during my care of the patient were reviewed by me and considered in my medical decision making (see chart for details).     Pressure irrigation performed. Wound explored and base of wound visualized in a bloodless field without evidence of foreign body.  Laceration occurred < 8 hours prior to repair which was well tolerated.  Tdap updated.  Pt has no comorbidities to effect normal wound healing. Pt discharged with antibiotics due to wound contamination.  Discussed suture home care with patient and answered questions. Pt to follow-up for wound check and suture removal in 7 days; they are to return to the ED sooner for signs of infection. Pt is hemodynamically stable with no complaints prior to dc.   Patient noted to be hypertensive in the emergency department.  No signs of hypertensive urgency.  Discussed with patient the need for close  follow-up and management by their primary care physician.    Final Clinical Impressions(s) / ED Diagnoses   Final diagnoses:  Laceration of right hand without foreign body, initial encounter  Laceration of right forearm, initial encounter  Laceration of right wrist, initial encounter  Abrasions of multiple sites    ED Discharge Orders        Ordered    cephALEXin (KEFLEX) 500 MG capsule  4 times daily     09/29/17 0450       Mose Colaizzi, Jarrett Soho, PA-C 88/67/73 [id number]    Delora Fuel, MD 81/59/47 [phone]

## 2017-09-29 NOTE — ED Triage Notes (Signed)
Pt reports trying to go through screen door and right arm got caught causing lacerations. Several lacerations to right forearm and wrist measuring 17cm on forearm and 3cm on wrist. Bleeding controlled at this time.

## 2017-09-29 NOTE — Discharge Instructions (Addendum)

## 2020-12-01 ENCOUNTER — Ambulatory Visit (HOSPITAL_COMMUNITY)
Admission: EM | Admit: 2020-12-01 | Discharge: 2020-12-01 | Disposition: A | Discharge status: Home / Self Care | Payer: PRIVATE HEALTH INSURANCE | Attending: Internal Medicine | Admitting: Internal Medicine

## 2020-12-01 ENCOUNTER — Emergency Department (HOSPITAL_COMMUNITY): Payer: PRIVATE HEALTH INSURANCE

## 2020-12-01 ENCOUNTER — Encounter (HOSPITAL_COMMUNITY): Payer: Self-pay

## 2020-12-01 ENCOUNTER — Emergency Department (HOSPITAL_COMMUNITY)
Admission: EM | Admit: 2020-12-01 | Discharge: 2020-12-02 | Disposition: A | Discharge status: Home / Self Care | Payer: PRIVATE HEALTH INSURANCE | Attending: Emergency Medicine | Admitting: Emergency Medicine

## 2020-12-01 ENCOUNTER — Encounter (HOSPITAL_COMMUNITY): Payer: Self-pay | Admitting: *Deleted

## 2020-12-01 ENCOUNTER — Other Ambulatory Visit: Payer: Self-pay

## 2020-12-01 DIAGNOSIS — Z20822 Contact with and (suspected) exposure to covid-19: Secondary | ICD-10-CM | POA: Diagnosis not present

## 2020-12-01 DIAGNOSIS — F17210 Nicotine dependence, cigarettes, uncomplicated: Secondary | ICD-10-CM | POA: Insufficient documentation

## 2020-12-01 DIAGNOSIS — R079 Chest pain, unspecified: Secondary | ICD-10-CM | POA: Diagnosis not present

## 2020-12-01 DIAGNOSIS — R000 Tachycardia, unspecified: Secondary | ICD-10-CM | POA: Diagnosis not present

## 2020-12-01 DIAGNOSIS — J189 Pneumonia, unspecified organism: Secondary | ICD-10-CM | POA: Insufficient documentation

## 2020-12-01 DIAGNOSIS — R059 Cough, unspecified: Secondary | ICD-10-CM | POA: Diagnosis present

## 2020-12-01 LAB — COMPREHENSIVE METABOLIC PANEL
ALT: 18 U/L (ref 0–44)
AST: 17 U/L (ref 15–41)
Albumin: 3.4 g/dL — ABNORMAL LOW (ref 3.5–5.0)
Alkaline Phosphatase: 65 U/L (ref 38–126)
Anion gap: 6 (ref 5–15)
BUN: 13 mg/dL (ref 6–20)
CO2: 25 mmol/L (ref 22–32)
Calcium: 12.2 mg/dL — ABNORMAL HIGH (ref 8.9–10.3)
Chloride: 104 mmol/L (ref 98–111)
Creatinine, Ser: 1.09 mg/dL (ref 0.61–1.24)
GFR, Estimated: >60 mL/min (ref >60)
Glucose, Bld: 106 mg/dL — ABNORMAL HIGH (ref 70–99)
Potassium: 3.9 mmol/L (ref 3.5–5.1)
Sodium: 135 mmol/L (ref 135–145)
Total Bilirubin: 0.9 mg/dL (ref 0.3–1.2)
Total Protein: 7.1 g/dL (ref 6.5–8.1)

## 2020-12-01 LAB — CBC WITH DIFFERENTIAL/PLATELET
Abs Immature Granulocytes: 0.02 10*3/uL (ref 0.00–0.07)
Basophils Absolute: 0.1 10*3/uL (ref 0.0–0.1)
Basophils Relative: 1 %
Eosinophils Absolute: 0.4 10*3/uL (ref 0.0–0.5)
Eosinophils Relative: 7 %
HCT: 40.3 % (ref 39.0–52.0)
Hemoglobin: 12.7 g/dL — ABNORMAL LOW (ref 13.0–17.0)
Immature Granulocytes: 0 %
Lymphocytes Relative: 26 %
Lymphs Abs: 1.4 10*3/uL (ref 0.7–4.0)
MCH: 24.7 pg — ABNORMAL LOW (ref 26.0–34.0)
MCHC: 31.5 g/dL (ref 30.0–36.0)
MCV: 78.3 fL — ABNORMAL LOW (ref 80.0–100.0)
Monocytes Absolute: 0.7 10*3/uL (ref 0.1–1.0)
Monocytes Relative: 13 %
Neutro Abs: 2.8 10*3/uL (ref 1.7–7.7)
Neutrophils Relative %: 53 %
Platelets: 301 10*3/uL (ref 150–400)
RBC: 5.15 MIL/uL (ref 4.22–5.81)
RDW: 14.3 % (ref 11.5–15.5)
WBC: 5.4 10*3/uL (ref 4.0–10.5)
nRBC: 0 % (ref 0.0–0.2)

## 2020-12-01 LAB — TROPONIN I (HIGH SENSITIVITY)
Troponin I (High Sensitivity): 5 ng/L (ref <18)
Troponin I (High Sensitivity): 4 ng/L (ref <18)

## 2020-12-01 IMAGING — DX DG CHEST 2V
2 series · 2 of 2 positions shown · non-contrast
Comparison: [DATE]

CLINICAL DATA: Chest pain is and cough for 2-3 days

EXAM:
CHEST - 2 VIEW

[chest pa]
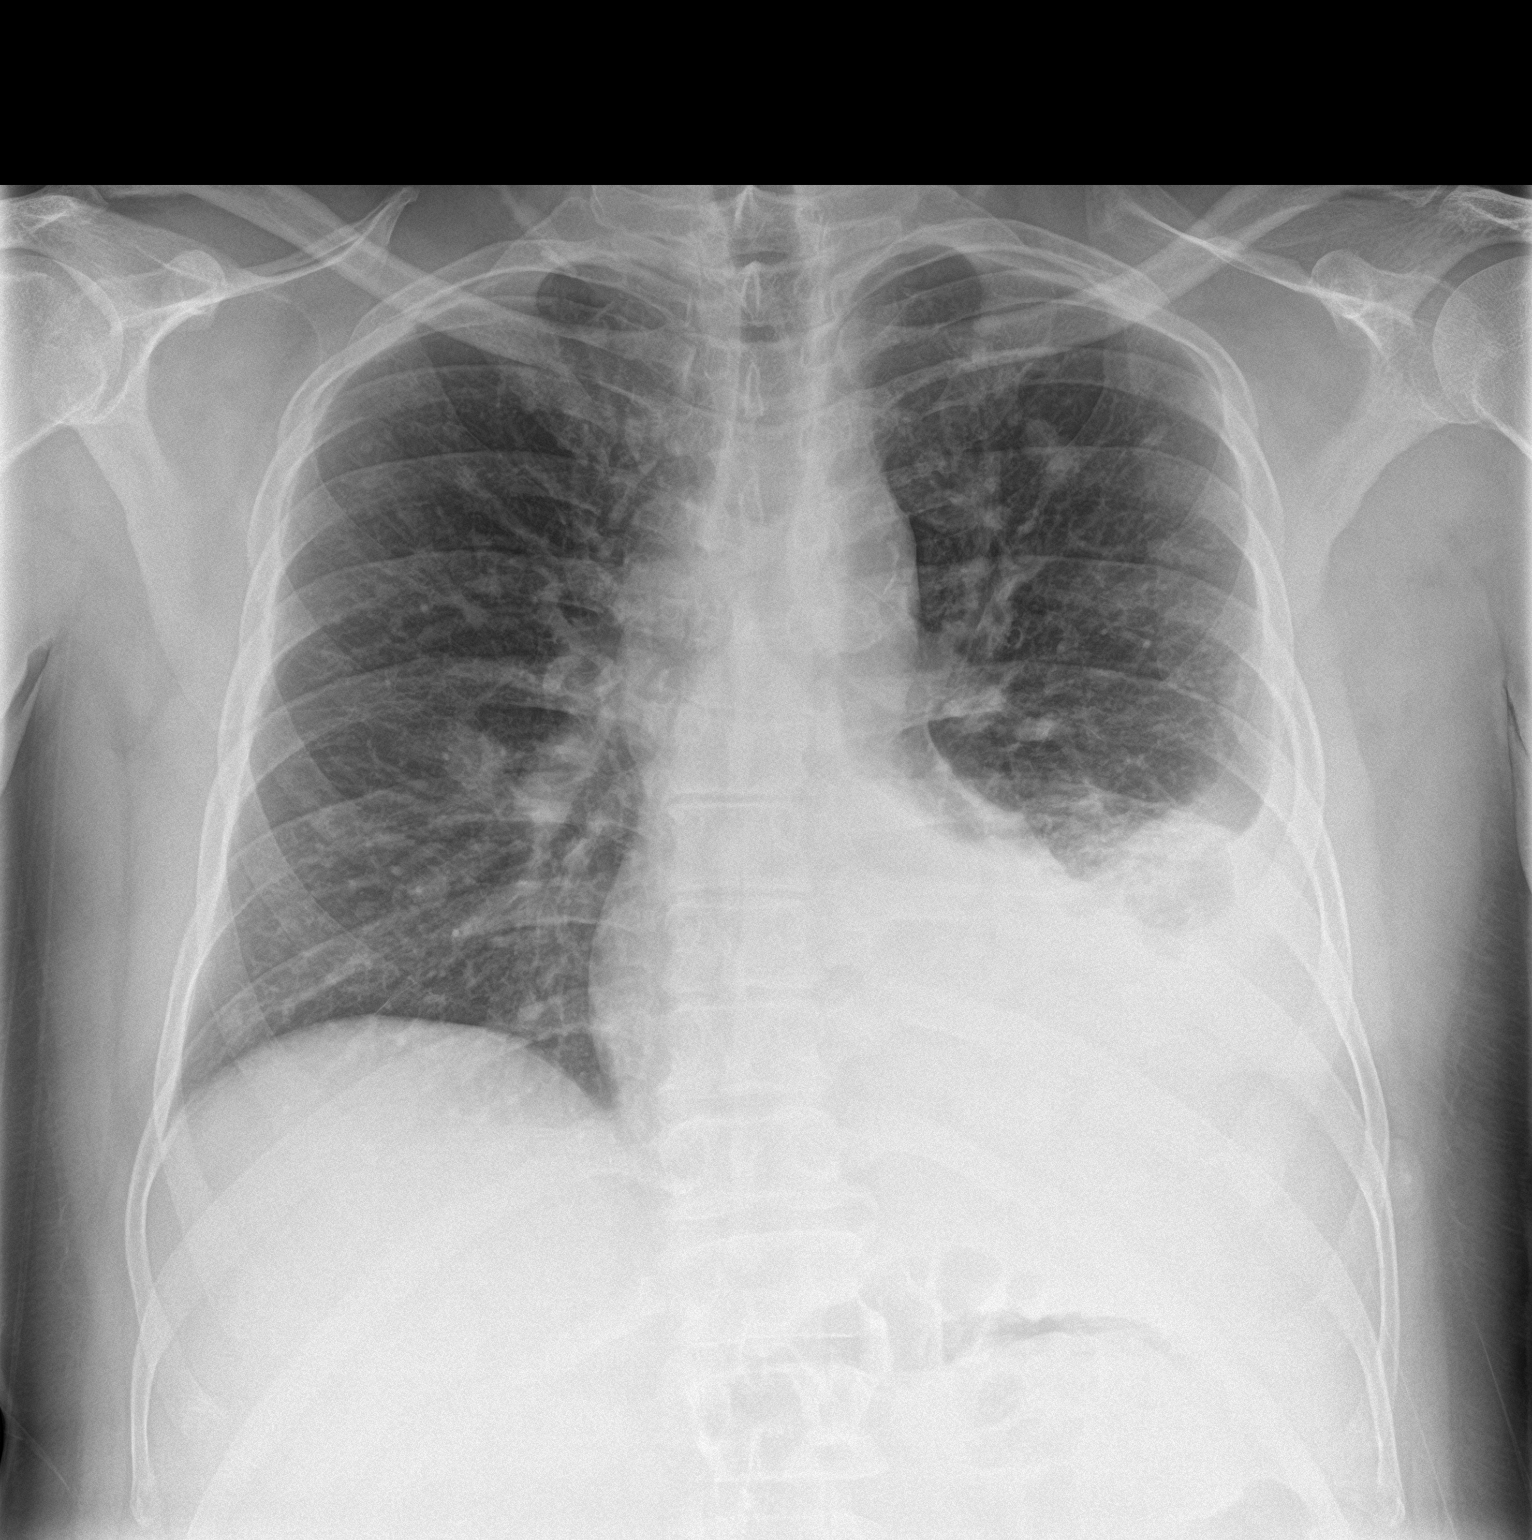

[chest lat]
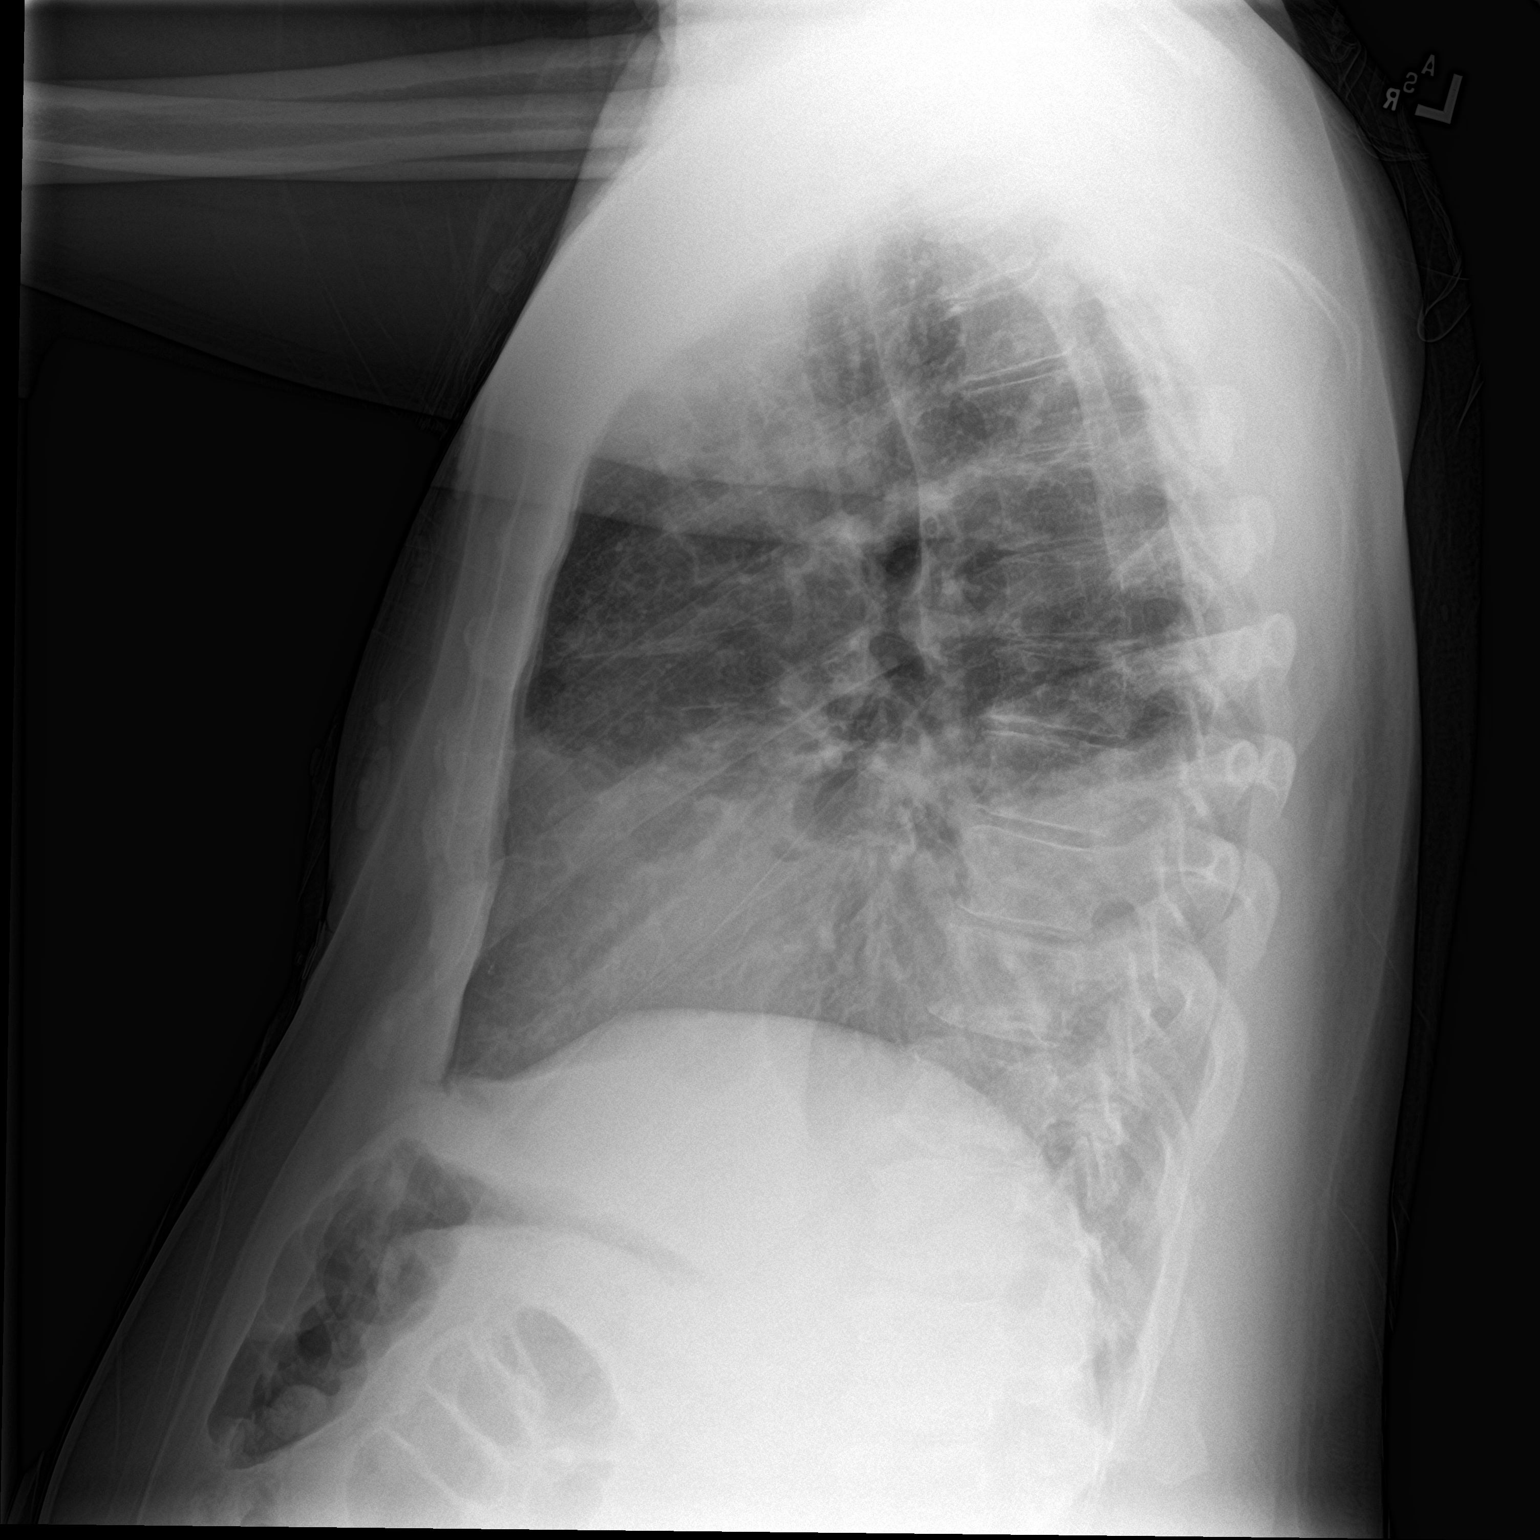

[2 of 2 positions shown; findings below may reference images not displayed]

FINDINGS: Left pleural effusion seen partially tracking into the fissures.
While much of the adjacent opacity is likely atelectatic some
underlying airspace disease is not fully excluded particularly given
additional patchy opacities in the left upper lung and right mid to
lower lung with diffuse airways thickening. No pneumothorax or right
effusion. Partial obscuration of the left heart border. The aorta is
calcified. The remaining cardiomediastinal contours are
unremarkable. Degenerative changes are present in the imaged spine
and shoulders.
IMPRESSION: Multifocal patchy opacities in the lungs could reflect multifocal
pneumonia in the appropriate clinical setting.

Moderate left pleural effusion with some adjacent passive
atelectasis. Underlying airspace disease within the atelectatic lung
may be present as well.

## 2020-12-01 NOTE — ED Provider Notes (Signed)
Emergency Medicine Provider Triage Evaluation Note  Dylan Good , a 50 y.o. male  was evaluated in triage.  Pt complains of chest pain.  Began 2 days ago.  Has been intermittent in nature.  Worse when he moves a certain direction.  Does not radiate to left arm, back or jaw.  No pleuritic component.  Does not typically get chest pain with exertion at baseline.  No unilateral leg swelling, redness or warmth.  No prior cardiac history.  He does not follow with PCP.  Rates his current pain a 2/10.  No prior history of PE, DVT.  Went on a road trip approximately 5 weeks ago however none recently.  Has associated nonproductive cough.  No diaphoresis, nausea or vomiting when pain occurs  Review of Systems  Positive: CP, cough Negative: Fever, chills, diaphoresis, nausea or vomiting  Physical Exam  There were no vitals taken for this visit. Gen:   Awake, no distress   Resp:  Normal effort  MSK:   Moves extremities without difficulty  SKIN:  No pedal edema Other:    Medical Decision Making  Medically screening exam initiated at 5:26 PM.  Appropriate orders placed.  Waylan Boga Yaffe was informed that the remainder of the evaluation will be completed by another provider, this initial triage assessment does not replace that evaluation, and the importance of remaining in the ED until their evaluation is complete.  Chest pain, cough  Labs and imaging ordered. Stable   Beckie Viscardi A, PA-C 12/01/20 1728    Breck Coons, MD 12/01/20 [phone]

## 2020-12-01 NOTE — ED Notes (Signed)
Checked in the lobby. Waiting for triage or any room in the back to triage the pt.

## 2020-12-01 NOTE — ED Triage Notes (Addendum)
Pt in with c/o left chest tightness that is intermittent. Pt states when he moves a certain way or coughs the pain is worse. Pt states the pain radiates to both arms at times. Denies any cardiac hx   Denies any lightheadedness, nausea, vision changes, sob

## 2020-12-01 NOTE — ED Notes (Signed)
Patient is being discharged from the Urgent Care and sent to the Emergency Department via pov . Per Merrie Roof, PA, patient is in need of higher level of care due to chest pain, tachycardia. Patient is aware and verbalizes understanding of plan of care.  Vitals:   12/01/20 1624 12/01/20 1631  BP: (!) 158/78   Pulse: (!) 105   Resp: 20   Temp:  99.8 F (37.7 C)  SpO2: 96%    ed

## 2020-12-01 NOTE — ED Triage Notes (Signed)
The pt is c/o chest pain and cough for 2-3 days  No temp  Cough is worse at night

## 2020-12-02 LAB — RESP PANEL BY RT-PCR (FLU A&B, COVID) ARPGX2
Influenza A by PCR: NEGATIVE
Influenza B by PCR: NEGATIVE
SARS Coronavirus 2 by RT PCR: NEGATIVE

## 2020-12-02 MED ORDER — NAPROXEN 500 MG PO TABS
500.0000 mg | ORAL_TABLET | Freq: Two times a day (BID) | ORAL | 0 refills | Status: DC
Start: 2020-12-02 — End: 2021-01-20

## 2020-12-02 MED ORDER — AZITHROMYCIN 250 MG PO TABS: 250.0000 mg | ORAL_TABLET | Freq: Every day | ORAL | 0 refills | Status: DC

## 2020-12-02 MED ORDER — KETOROLAC TROMETHAMINE 30 MG/ML IJ SOLN
30.0000 mg | Freq: Once | INTRAMUSCULAR | Status: AC
  Administered 2020-12-02: 30 mg via INTRAVENOUS
  Filled 2020-12-02: qty 1

## 2020-12-02 MED ORDER — SODIUM CHLORIDE 0.9 % IV SOLN
1.0000 g | Freq: Once | INTRAVENOUS | Status: AC
  Administered 2020-12-02: 1 g via INTRAVENOUS
  Filled 2020-12-02: qty 10

## 2020-12-02 MED ORDER — AMOXICILLIN 500 MG PO CAPS: 1000.0000 mg | ORAL_CAPSULE | Freq: Three times a day (TID) | ORAL | 0 refills | Status: DC

## 2020-12-02 MED ORDER — ALBUTEROL SULFATE HFA 108 (90 BASE) MCG/ACT IN AERS
2.0000 | INHALATION_SPRAY | Freq: Once | RESPIRATORY_TRACT | Status: AC
  Administered 2020-12-02: 2 via RESPIRATORY_TRACT
  Filled 2020-12-02: qty 6.7

## 2020-12-02 NOTE — ED Provider Notes (Signed)
[facility] DEPART[facility] Provider Note   CSN: [id number] Arrival date & time: 12/01/20  1651     History Chief Complaint  Patient presents with  . Chest Pain    Dylan Good is a 50 y.o. male.  HPI     This 50 year old male with a history of smoking who presents with chest pain and cough.  Patient reports 2 to 3 days of cough and chest discomfort.  He states his pain is in the left chest and only when he coughs.  Denies significant shortness of breath.  Reports that it is sharp and nonradiating.  No exertional symptoms.  Rates his pain at 7 out of 10 when he coughs.  No fevers.  Has had chills.  No lower extremity swelling or history of blood clots.  Patient reports he has not had any known COVID exposures.  He has been fully vaccinated with booster.  He reports a negative rapid COVID test yesterday.   History reviewed. No pertinent past medical history.  There are no problems to display for this patient.   Past Surgical History:  Procedure Laterality Date  . APPENDECTOMY         No family history on file.  Social History   Tobacco Use  . Smoking status: Current Some Day Smoker    Packs/day: 0.50    Types: Cigarettes  . Smokeless tobacco: Never Used  Substance Use Topics  . Alcohol use: Never  . Drug use: Never    Home Medications Prior to Admission medications   Medication Sig Start Date End Date Taking? Authorizing Provider  amoxicillin (AMOXIL) 500 MG capsule Take 2 capsules (1,000 mg total) by mouth 3 (three) times daily. 12/02/20  Yes Tonica Brasington, Barbette Hair, MD  azithromycin (ZITHROMAX) 250 MG tablet Take 1 tablet (250 mg total) by mouth daily. Take first 2 tablets together, then 1 every day until finished. 12/02/20  Yes Odetta Forness, Barbette Hair, MD  naproxen (NAPROSYN) 500 MG tablet Take 1 tablet (500 mg total) by mouth 2 (two) times daily. 12/02/20  Yes Stepahnie Campo, Barbette Hair, MD    Allergies    Patient has no known allergies.  Review of  Systems   Review of Systems  Constitutional: Positive for chills. Negative for fever.  Respiratory: Positive for cough. Negative for shortness of breath.   Cardiovascular: Positive for chest pain. Negative for leg swelling.  Gastrointestinal: Negative for abdominal pain, nausea and vomiting.  All other systems reviewed and are negative.   Physical Exam Updated Vital Signs BP (!) 158/93   Pulse 91   Temp 99 F (37.2 C) (Oral)   Resp (!) 23   Ht 1.753 m (5\' 9" )   Wt 93 kg   SpO2 94%   BMI 30.28 kg/m   Physical Exam Vitals and nursing note reviewed.  Constitutional:      Appearance: He is well-developed. He is not ill-appearing.  HENT:     Head: Normocephalic and atraumatic.  Eyes:     Pupils: Pupils are equal, round, and reactive to light.  Cardiovascular:     Rate and Rhythm: Normal rate and regular rhythm.     Heart sounds: Normal heart sounds. No murmur heard.   Pulmonary:     Effort: Pulmonary effort is normal. No respiratory distress.     Breath sounds: Normal breath sounds. No wheezing.     Comments: Diminished breath sounds on the left, no wheezing, actively coughing Chest:     Chest wall: Tenderness  present.  Abdominal:     General: Bowel sounds are normal.     Palpations: Abdomen is soft.     Tenderness: There is no abdominal tenderness. There is no rebound.  Musculoskeletal:     Cervical back: Neck supple.     Right lower leg: No tenderness. No edema.     Left lower leg: No tenderness. No edema.  Lymphadenopathy:     Cervical: No cervical adenopathy.  Skin:    General: Skin is warm and dry.  Neurological:     Mental Status: He is alert and oriented to person, place, and time.  Psychiatric:        Mood and Affect: Mood normal.     ED Results / Procedures / Treatments   Labs (all labs ordered are listed, but only abnormal results are displayed) Labs Reviewed  CBC WITH DIFFERENTIAL/PLATELET - Abnormal; Notable for the following components:       Result Value   Hemoglobin 12.7 (*)    MCV 78.3 (*)    MCH 24.7 (*)    All other components within normal limits  COMPREHENSIVE METABOLIC PANEL - Abnormal; Notable for the following components:   Glucose, Bld 106 (*)    Calcium 12.2 (*)    Albumin 3.4 (*)    All other components within normal limits  RESP PANEL BY RT-PCR (FLU A&B, COVID) ARPGX2  TROPONIN I (HIGH SENSITIVITY)  TROPONIN I (HIGH SENSITIVITY)    EKG EKG Interpretation  Date/Time:  Friday Dec 01 2020 17:27:38 EDT Ventricular Rate:  103 PR Interval:  144 QRS Duration: 84 QT Interval:  318 QTC Calculation: 416 R Axis:   62 Text Interpretation: Sinus tachycardia Septal infarct , age undetermined Abnormal ECG Confirmed by Thayer Jew [phone]) on 12/02/2020 12:51:57 AM   Radiology DG Chest 2 View  Result Date: 12/01/2020 CLINICAL DATA:  Chest pain is and cough for 2-3 days EXAM: CHEST - 2 VIEW COMPARISON:  12/23/2011 FINDINGS: Left pleural effusion seen partially tracking into the fissures. While much of the adjacent opacity is likely atelectatic some underlying airspace disease is not fully excluded particularly given additional patchy opacities in the left upper lung and right mid to lower lung with diffuse airways thickening. No pneumothorax or right effusion. Partial obscuration of the left heart border. The aorta is calcified. The remaining cardiomediastinal contours are unremarkable. Degenerative changes are present in the imaged spine and shoulders. IMPRESSION: Multifocal patchy opacities in the lungs could reflect multifocal pneumonia in the appropriate clinical setting. Moderate left pleural effusion with some adjacent passive atelectasis. Underlying airspace disease within the atelectatic lung may be present as well. Electronically Signed   By: Lovena Le M.D.   On: 12/01/2020 18:10    Procedures Procedures   Medications Ordered in ED Medications  cefTRIAXone (ROCEPHIN) 1 g in sodium chloride 0.9 % 100 mL  IVPB (0 g Intravenous Stopped 12/02/20 0235)  albuterol (VENTOLIN HFA) 108 (90 Base) MCG/ACT inhaler 2 puff (2 puffs Inhalation Given 12/02/20 0146)  ketorolac (TORADOL) 30 MG/ML injection 30 mg (30 mg Intravenous Given 12/02/20 0146)    ED Course  I have reviewed the triage vital signs and the nursing notes.  Pertinent labs & imaging results that were available during my care of the patient were reviewed by me and considered in my medical decision making (see chart for details).    MDM Rules/Calculators/A&P  Patient presents with cough and chest pain.  He is overall nontoxic.  He is afebrile.  Vital signs notable for respiratory rate of 23 and O2 sat of 94%.  He is a current smoker.  He is in no obvious respiratory distress on exam.  He has diminished breath sounds on the left.  Considerations include but not limited to, pneumonia, pneumothorax, COVID-19, viral etiology, bronchospasm.  Chest x-ray shows multifocal pneumonia.  Patient reports recent negative COVID testing; however, will send PCR given how the chest x-ray appears.  EKG without acute ischemic changes.  Troponin x2 negative.  Doubt ACS.  No significant metabolic derangements.  Patient was given an gram of Rocephin.  He was able to ambulate maintain his pulse ox.  COVID and flu testing are negative.  Will discharge with amoxicillin and azithromycin.  Naproxen for pain.  Patient was given strict return precautions.  After history, exam, and medical workup I feel the patient has been appropriately medically screened and is safe for discharge home. Pertinent diagnoses were discussed with the patient. Patient was given return precautions.  Final Clinical Impression(s) / ED Diagnoses Final diagnoses:  Community acquired pneumonia, unspecified laterality    Rx / DC Orders ED Discharge Orders         Ordered    amoxicillin (AMOXIL) 500 MG capsule  3 times daily        12/02/20 0246    naproxen (NAPROSYN) 500 MG  tablet  2 times daily        12/02/20 0246    azithromycin (ZITHROMAX) 250 MG tablet  Daily        12/02/20 0246           [name], MD 12/02/20 [phone]

## 2020-12-02 NOTE — Discharge Instructions (Addendum)
Was seen today for cough and chest pain.  You are found to have pneumonia on your x-ray.  Take medications as prescribed.  Take naproxen for pain.  Your COVID testing was negative.

## 2020-12-02 NOTE — ED Notes (Signed)
Pt ambulated with pulse ox and maintained at 98% on room air.

## 2020-12-20 ENCOUNTER — Other Ambulatory Visit: Payer: Self-pay

## 2020-12-20 ENCOUNTER — Ambulatory Visit (HOSPITAL_COMMUNITY)
Admission: EM | Admit: 2020-12-20 | Discharge: 2020-12-20 | Disposition: A | Discharge status: Home / Self Care | Payer: PRIVATE HEALTH INSURANCE | Attending: Urgent Care | Admitting: Urgent Care

## 2020-12-20 ENCOUNTER — Ambulatory Visit (INDEPENDENT_AMBULATORY_CARE_PROVIDER_SITE_OTHER): Payer: PRIVATE HEALTH INSURANCE

## 2020-12-20 ENCOUNTER — Emergency Department (HOSPITAL_COMMUNITY)
Admission: EM | Admit: 2020-12-20 | Discharge: 2020-12-20 | Disposition: A | Discharge status: Home / Self Care | Payer: PRIVATE HEALTH INSURANCE | Attending: Emergency Medicine | Admitting: Emergency Medicine

## 2020-12-20 ENCOUNTER — Encounter (HOSPITAL_COMMUNITY): Payer: Self-pay | Admitting: Emergency Medicine

## 2020-12-20 ENCOUNTER — Encounter (HOSPITAL_COMMUNITY): Payer: Self-pay | Admitting: Pharmacy Technician

## 2020-12-20 ENCOUNTER — Emergency Department (HOSPITAL_COMMUNITY): Payer: PRIVATE HEALTH INSURANCE

## 2020-12-20 DIAGNOSIS — R918 Other nonspecific abnormal finding of lung field: Secondary | ICD-10-CM

## 2020-12-20 DIAGNOSIS — R0600 Dyspnea, unspecified: Secondary | ICD-10-CM | POA: Diagnosis present

## 2020-12-20 DIAGNOSIS — R059 Cough, unspecified: Secondary | ICD-10-CM

## 2020-12-20 DIAGNOSIS — R0789 Other chest pain: Secondary | ICD-10-CM

## 2020-12-20 DIAGNOSIS — R52 Pain, unspecified: Secondary | ICD-10-CM

## 2020-12-20 DIAGNOSIS — R051 Acute cough: Secondary | ICD-10-CM | POA: Diagnosis not present

## 2020-12-20 DIAGNOSIS — J90 Pleural effusion, not elsewhere classified: Secondary | ICD-10-CM | POA: Diagnosis not present

## 2020-12-20 DIAGNOSIS — F17210 Nicotine dependence, cigarettes, uncomplicated: Secondary | ICD-10-CM | POA: Diagnosis not present

## 2020-12-20 LAB — BASIC METABOLIC PANEL
Anion gap: 6 (ref 5–15)
BUN: 11 mg/dL (ref 6–20)
CO2: 25 mmol/L (ref 22–32)
Calcium: 12.8 mg/dL — ABNORMAL HIGH (ref 8.9–10.3)
Chloride: 106 mmol/L (ref 98–111)
Creatinine, Ser: 0.92 mg/dL (ref 0.61–1.24)
GFR, Estimated: >60 mL/min (ref >60)
Glucose, Bld: 102 mg/dL — ABNORMAL HIGH (ref 70–99)
Potassium: 4 mmol/L (ref 3.5–5.1)
Sodium: 137 mmol/L (ref 135–145)

## 2020-12-20 LAB — CBC WITH DIFFERENTIAL/PLATELET
Abs Immature Granulocytes: 0.01 10*3/uL (ref 0.00–0.07)
Basophils Absolute: 0.1 10*3/uL (ref 0.0–0.1)
Basophils Relative: 1 %
Eosinophils Absolute: 0.1 10*3/uL (ref 0.0–0.5)
Eosinophils Relative: 2 %
HCT: 42.5 % (ref 39.0–52.0)
Hemoglobin: 13 g/dL (ref 13.0–17.0)
Immature Granulocytes: 0 %
Lymphocytes Relative: 28 %
Lymphs Abs: 1.4 10*3/uL (ref 0.7–4.0)
MCH: 24.4 pg — ABNORMAL LOW (ref 26.0–34.0)
MCHC: 30.6 g/dL (ref 30.0–36.0)
MCV: 79.7 fL — ABNORMAL LOW (ref 80.0–100.0)
Monocytes Absolute: 0.5 10*3/uL (ref 0.1–1.0)
Monocytes Relative: 11 %
Neutro Abs: 2.8 10*3/uL (ref 1.7–7.7)
Neutrophils Relative %: 58 %
Platelets: 315 10*3/uL (ref 150–400)
RBC: 5.33 MIL/uL (ref 4.22–5.81)
RDW: 14.5 % (ref 11.5–15.5)
WBC: 5 10*3/uL (ref 4.0–10.5)
nRBC: 0 % (ref 0.0–0.2)

## 2020-12-20 LAB — TROPONIN I (HIGH SENSITIVITY)
Troponin I (High Sensitivity): 3 ng/L (ref <18)
Troponin I (High Sensitivity): 4 ng/L (ref <18)

## 2020-12-20 IMAGING — DX DG CHEST 2V
2 series · 2 of 2 positions shown · non-contrast
Comparison: [DATE]

CLINICAL DATA: Cough and difficulty breathing

EXAM:
CHEST - 2 VIEW

[chest pa]
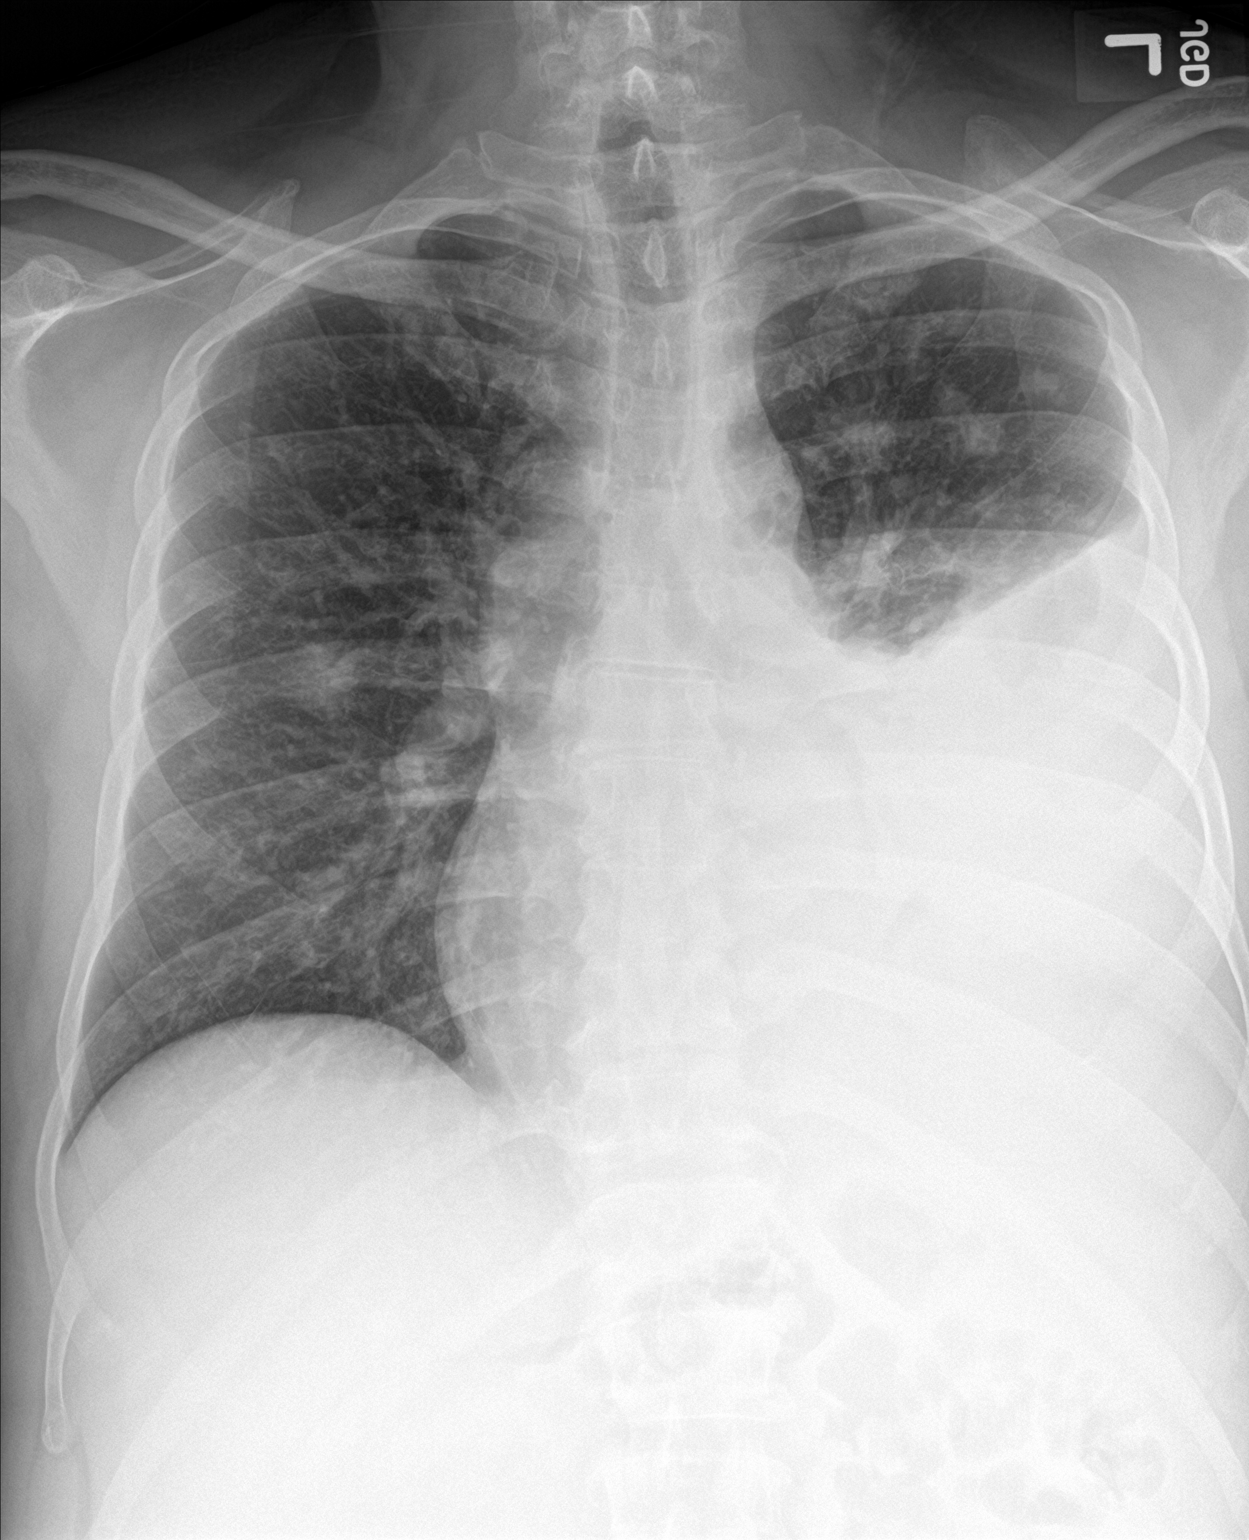

[chest lat]
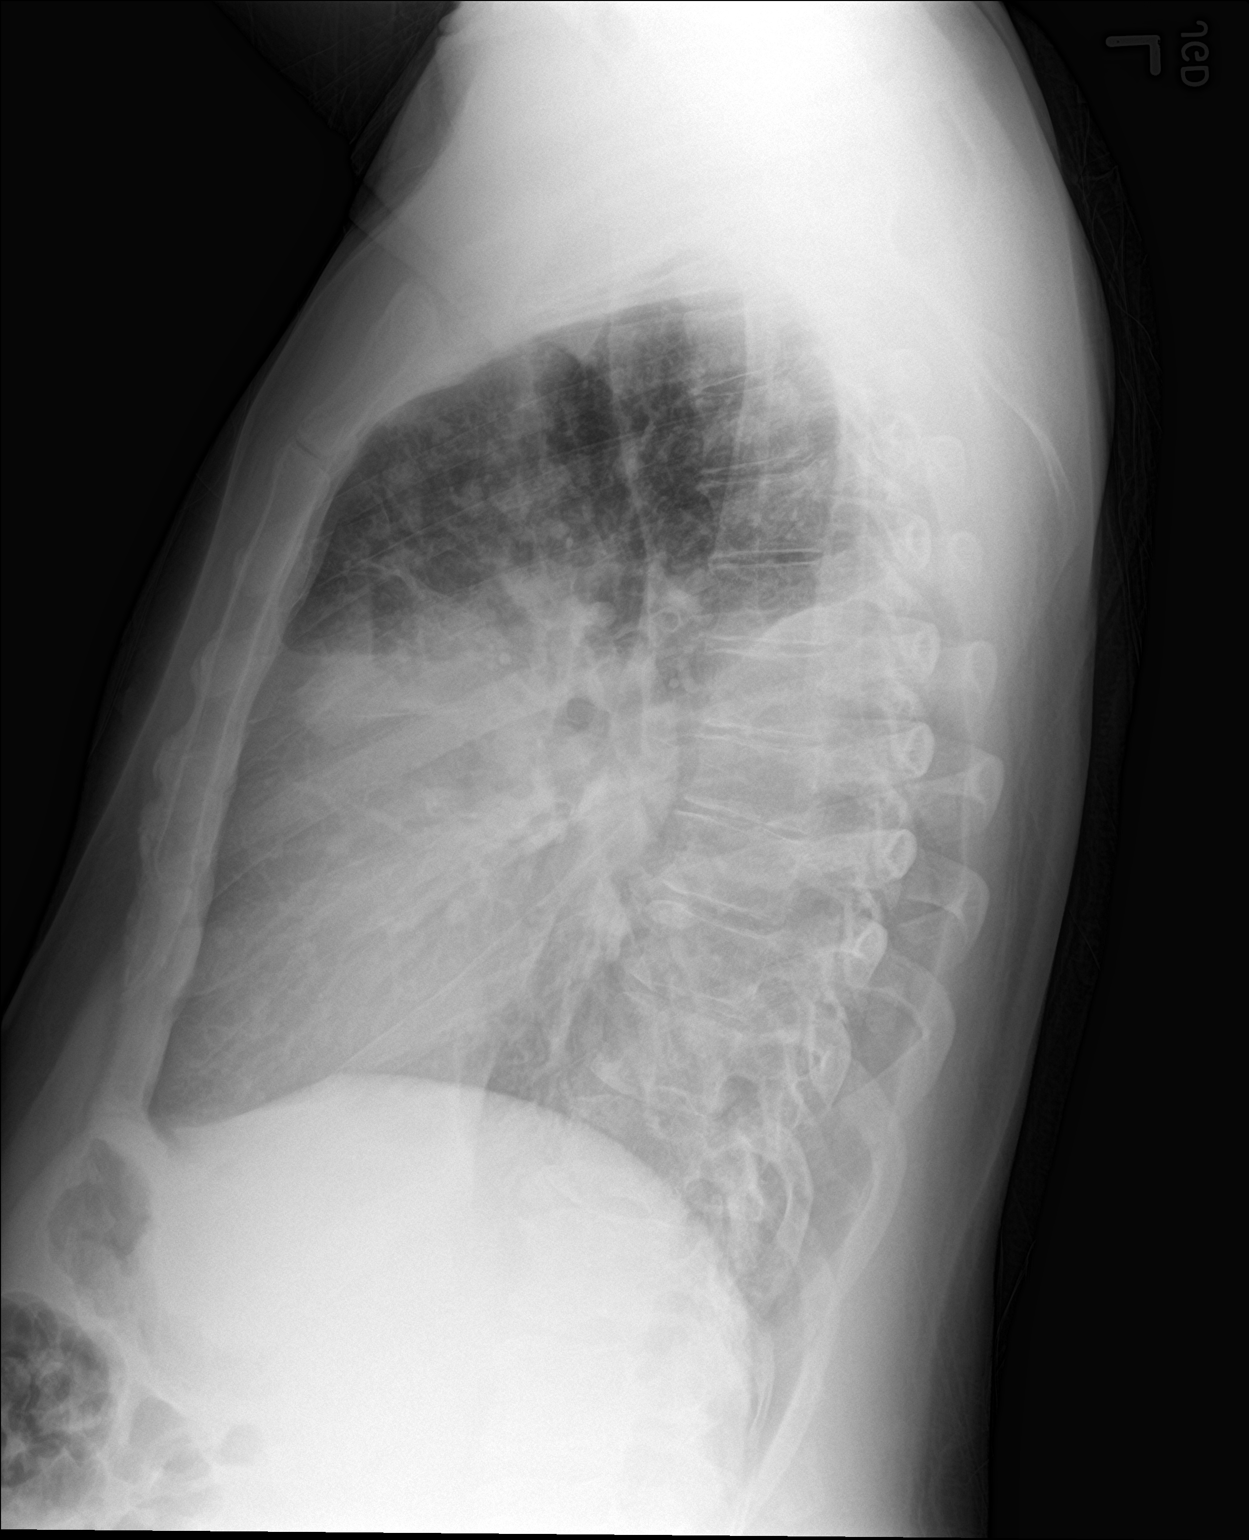

[2 of 2 positions shown; findings below may reference images not displayed]

FINDINGS: Cardiac shadow is prominent but obscured by a large pleural effusion
which is increased in the interval from the prior exam. Patchy
somewhat nodular changes are again identified bilaterally which may
again represent multifocal infiltrate. CT may be helpful for further
evaluation. No bony abnormality is noted.
IMPRESSION: Worsening left-sided pleural effusion and left basilar
consolidation. Patchy somewhat nodular densities are identified
bilaterally which may represent infiltrate although the possibility
of a more aggressive process could not be totally excluded. CT of
the chest with contrast is recommended for further evaluation.

## 2020-12-20 IMAGING — CT CT CHEST W/ CM
2 of 4 series · 15 of 36 positions shown, 18 images · IV contrast (APPLIED)
Comparison: None.

CLINICAL DATA: Pleural effusion

EXAM:
CT CHEST WITH CONTRAST
TECHNIQUE: Multidetector CT imaging of the chest was performed during
intravenous contrast administration.
CONTRAST:  75mL OMNIPAQUE IOHEXOL 300 MG/ML  SOLN

[Series 3: chest w · axial · 0.74mm/px · z∈[+1217,+1495]mm · 12 of 165 slices shown, 15 images]
[im 13/165  mediastinal]
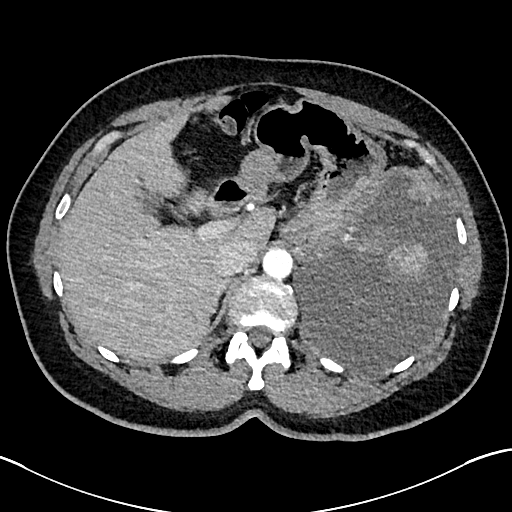
[im 13/165  lung]
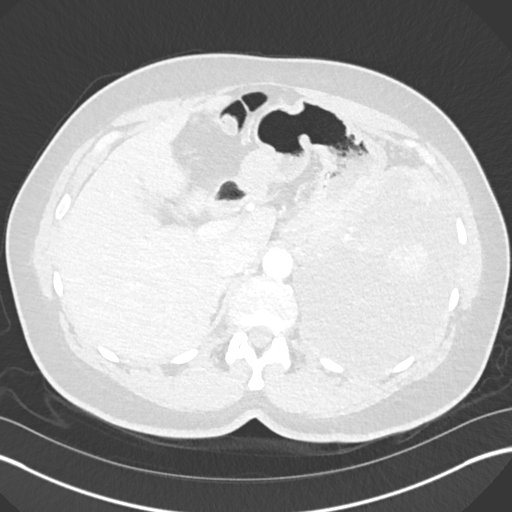
[im 26/165  lung]
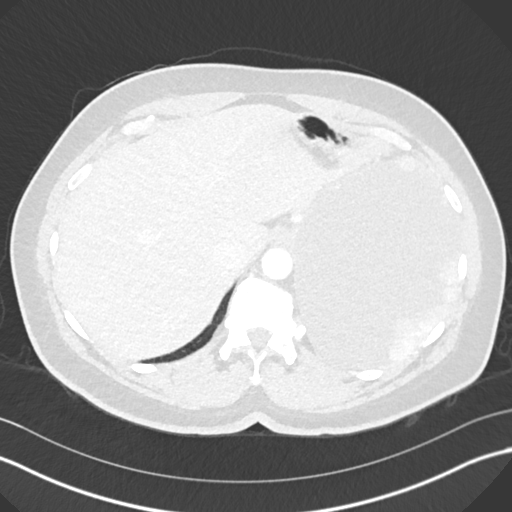
[im 38/165  lung]
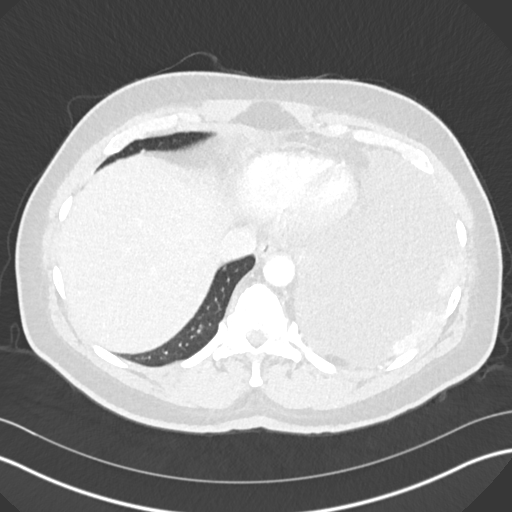
[im 51/165  lung]
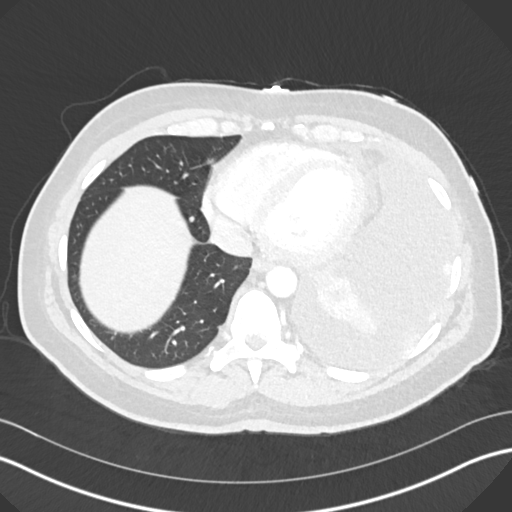
[im 64/165  mediastinal]
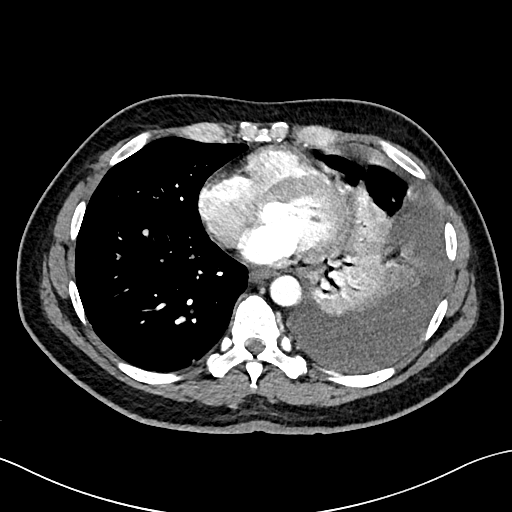
[im 64/165  lung]
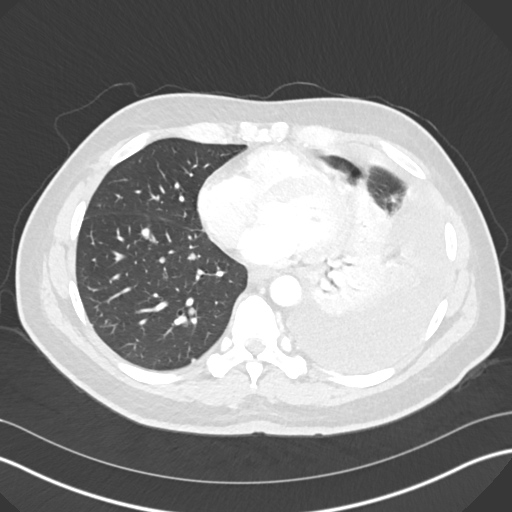
[im 76/165  lung]
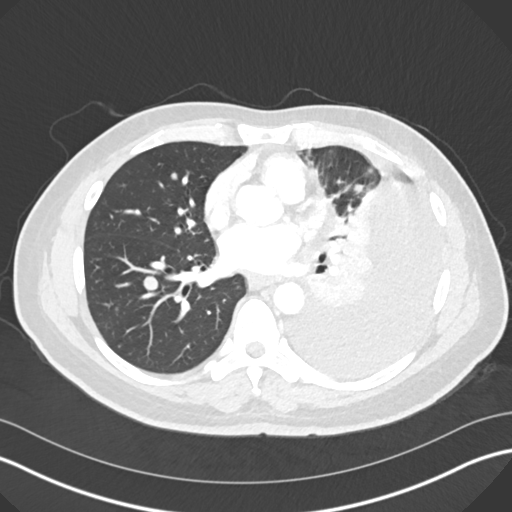
[im 89/165  lung]
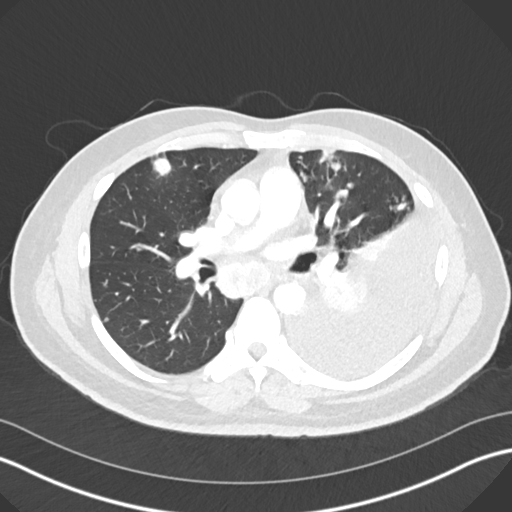
[im 101/165  lung]
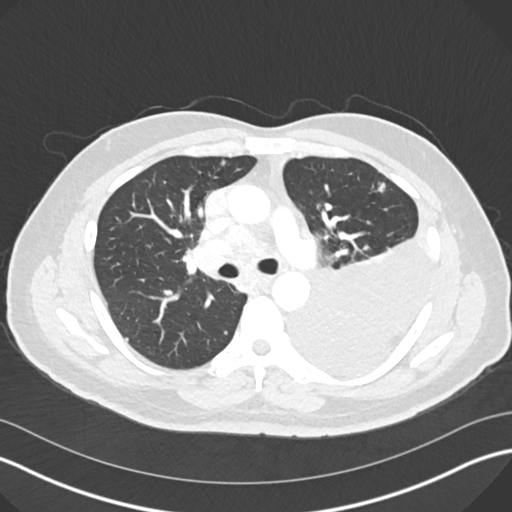
[im 114/165  mediastinal]
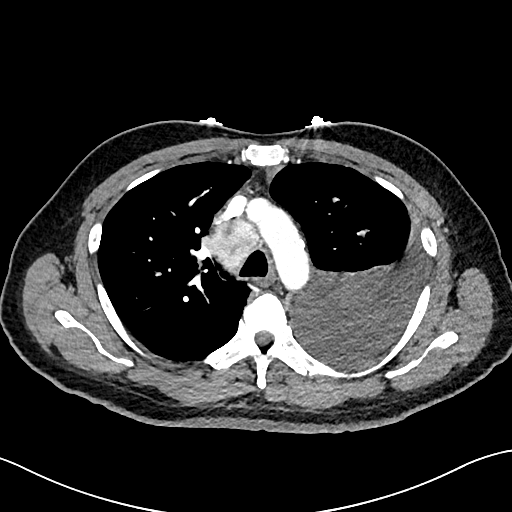
[im 114/165  lung]
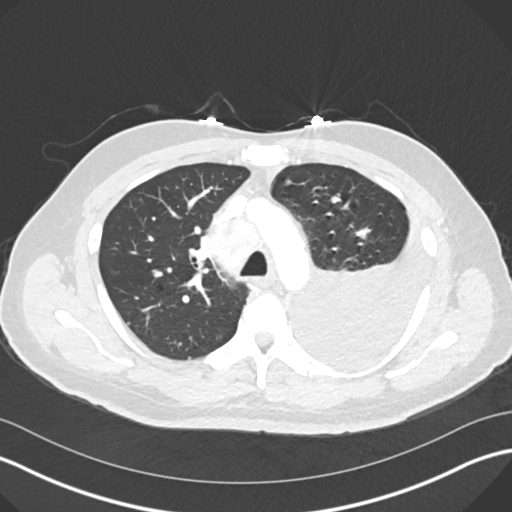
[im 127/165  lung]
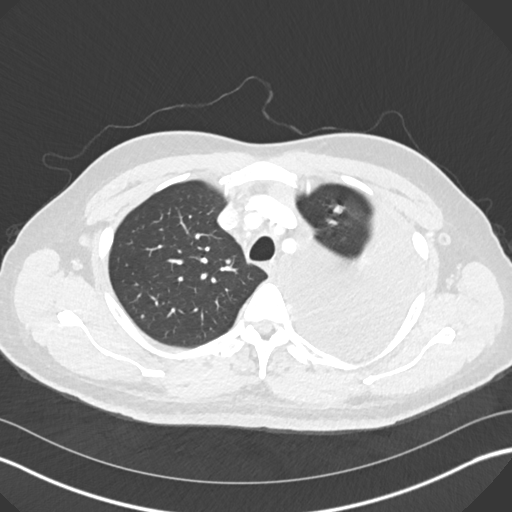
[im 139/165  lung]
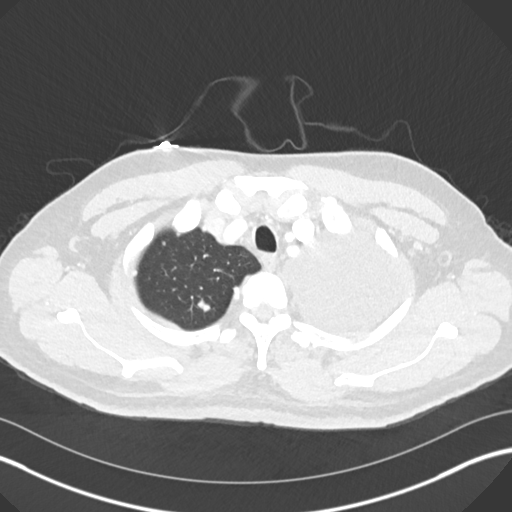
[im 152/165  lung]
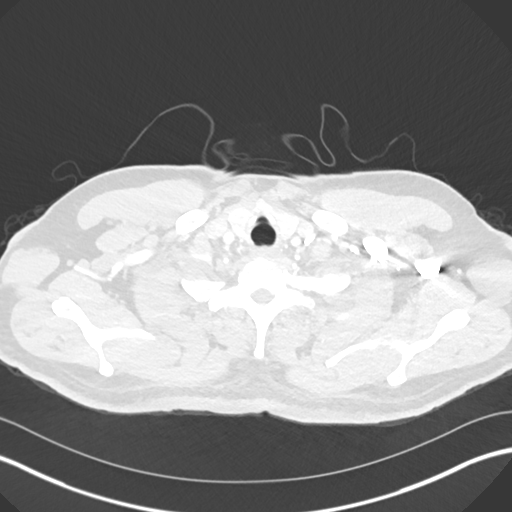

[Series 6: cor · coronal · 0.64mm/px · 3 of 150 slices shown]
[im 30/150  lung]
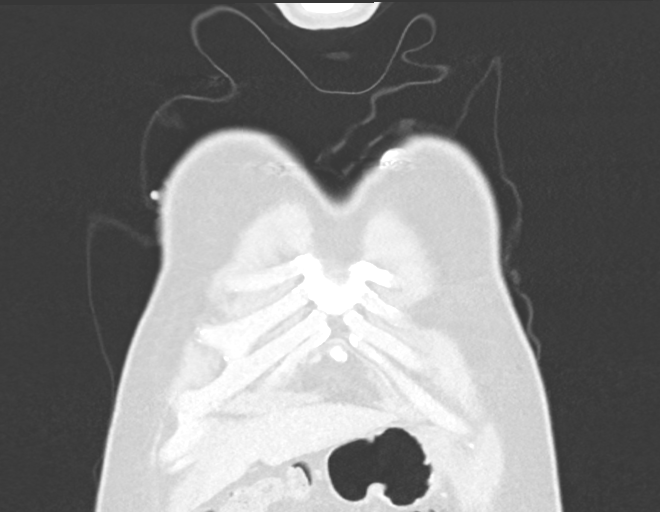
[im 60/150  lung]
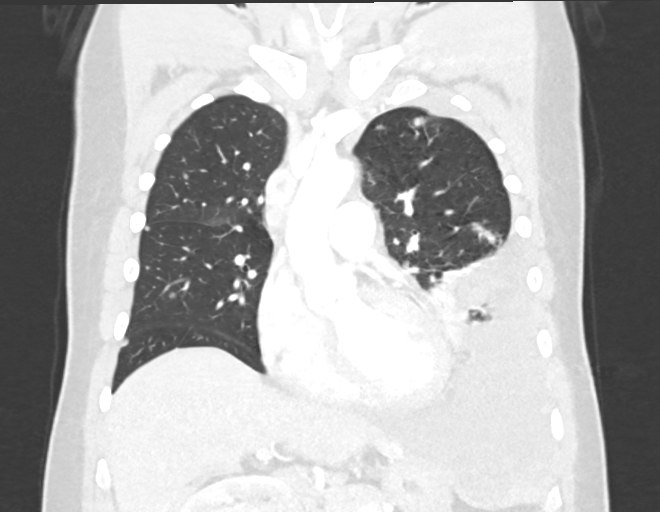
[im 90/150  lung]
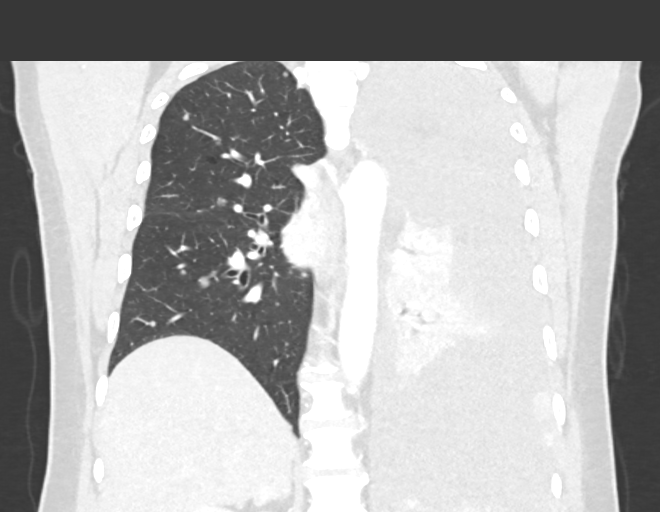

[15 of 36 positions shown; findings below may reference images not displayed]

FINDINGS: Cardiovascular: No significant vascular findings. Normal heart size.
No pericardial effusion.

Mediastinum/Nodes: Extensive mediastinal lymphadenopathy with the
largest right paratracheal lymph node measuring 3.2 cm. Largest
subcarinal lymph node measures 2.7 cm. Enlarged right hilar lymph
node measures 18 mm. 12 mm hypodense right thyroid nodule. Trachea
and esophagus are unremarkable.

Lungs/Pleura: Large left pleural effusion. Multiple left pleural
based masses with the largest conglomeration measuring 2.1 x 9.2 cm
most consistent with pleural based metastases. Numerous bilateral
pulmonary nodules of varying sizes with the largest in the right
upper lobe measuring 2 x 1.6 cm.

Upper Abdomen: Right kidney is incompletely visualized, but there is
suggestion of a right renal mass concerning for malignancy given the
pulmonary findings.

Musculoskeletal: No acute osseous abnormality.
IMPRESSION: 1. Numerous bilateral pulmonary nodules most concerning for
metastatic disease. Pleural based metastatic disease in the left
hemithorax with a large pleural effusion.
2. Right kidney is incompletely visualized, but there is suggestion
of a right renal mass concerning for malignancy given the pulmonary
findings. Overall findings are concerning for renal cell carcinoma
with metastatic disease. Recommend further evaluation with a CT of
the abdomen/pelvis with a renal mass protocol. Oncology consultation
recommended.

## 2020-12-20 MED ORDER — IOHEXOL 300 MG/ML  SOLN
75.0000 mL | Freq: Once | INTRAMUSCULAR | Status: AC | PRN
  Administered 2020-12-20: 75 mL via INTRAVENOUS

## 2020-12-20 NOTE — ED Provider Notes (Signed)
Dylan Good   MRN: [id number] DOB: 1970-09-15  Subjective:   Dylan Good is a 50 y.o. male presenting for recheck.  Patient was last seen 12/01/2020 through the emergency room.  He was found to have multifocal pneumonia and a pleural effusion.  He was treated with azithromycin, amoxicillin.  Reports that he completed the entire course of antibiotics.  Unfortunately, he reports no improvement in his symptoms.  Denies feeling worse but still has a cough, headache, body aches, fatigue, chest pain from coughing.  Does not have any specific medical history apart from appendicitis which led to an appendectomy.  He is a smoker but does not smoke every day.  No current facility-administered medications for this encounter.  Current Outpatient Medications:    amoxicillin (AMOXIL) 500 MG capsule, Take 2 capsules (1,000 mg total) by mouth 3 (three) times daily., Disp: 42 capsule, Rfl: 0   azithromycin (ZITHROMAX) 250 MG tablet, Take 1 tablet (250 mg total) by mouth daily. Take first 2 tablets together, then 1 every day until finished., Disp: 6 tablet, Rfl: 0   naproxen (NAPROSYN) 500 MG tablet, Take 1 tablet (500 mg total) by mouth 2 (two) times daily., Disp: 30 tablet, Rfl: 0   No Known Allergies  History reviewed. No pertinent past medical history.   Past Surgical History:  Procedure Laterality Date   APPENDECTOMY      History reviewed. No pertinent family history.  Social History   Tobacco Use   Smoking status: Some Days    Packs/day: 0.50    Pack years: 0.00    Types: Cigarettes   Smokeless tobacco: Never  Substance Use Topics   Alcohol use: Never   Drug use: Never    ROS   Objective:   Vitals: BP (!) 150/86 (BP Location: Right Arm)   Pulse 92   Temp 98.8 F (37.1 C) (Oral)   Resp 17   SpO2 98%   Physical Exam Constitutional:      General: He is not in acute distress.    Appearance: Normal appearance. He is well-developed. He is not  ill-appearing, toxic-appearing or diaphoretic.  HENT:     Head: Normocephalic and atraumatic.     Right Ear: External ear normal.     Left Ear: External ear normal.     Nose: Nose normal.     Mouth/Throat:     Mouth: Mucous membranes are moist.     Pharynx: Oropharynx is clear.  Eyes:     General: No scleral icterus.    Extraocular Movements: Extraocular movements intact.     Pupils: Pupils are equal, round, and reactive to light.  Cardiovascular:     Rate and Rhythm: Normal rate and regular rhythm.     Heart sounds: Normal heart sounds. No murmur heard.   No friction rub. No gallop.  Pulmonary:     Effort: Pulmonary effort is normal. No respiratory distress.     Breath sounds: No stridor. No wheezing, rhonchi or rales.     Comments: Decreased lung sounds mid-lower lung fields. Neurological:     Mental Status: He is alert and oriented to person, place, and time.  Psychiatric:        Mood and Affect: Mood normal.        Behavior: Behavior normal.        Thought Content: Thought content normal.    DG Chest 2 View  Result Date: 12/20/2020 CLINICAL DATA:  Cough and difficulty breathing EXAM: CHEST -  2 VIEW COMPARISON:  12/01/2020 FINDINGS: Cardiac shadow is prominent but obscured by a large pleural effusion which is increased in the interval from the prior exam. Patchy somewhat nodular changes are again identified bilaterally which may again represent multifocal infiltrate. CT may be helpful for further evaluation. No bony abnormality is noted. IMPRESSION: Worsening left-sided pleural effusion and left basilar consolidation. Patchy somewhat nodular densities are identified bilaterally which may represent infiltrate although the possibility of a more aggressive process could not be totally excluded. CT of the chest with contrast is recommended for further evaluation. Electronically Signed   By: Inez Catalina M.D.   On: 12/20/2020 11:52     Assessment and Plan :   PDMP not reviewed this  encounter.  1. Pleural effusion   2. Cough   3. Body aches   4. Atypical chest pain     Unfortunately, patient has failed outpatient management.  His pleural effusion is worse and he is still symptomatic.  Emphasized need for an ER visit for further evaluation including a chest CT, further management.  Patient contracts for safety, he is hemodynamically stable and will report there by personal vehicle.   Jaynee Eagles, [place] 12/20/20 1208

## 2020-12-20 NOTE — ED Triage Notes (Signed)
Pt is present today with a cough, HA, ear ache, and body aches. Pt states that these sx are still lingering from his last visit on 5/27. Pt states that he finished the antibiotic but patient states that he is still not feeling better.

## 2020-12-20 NOTE — ED Provider Notes (Signed)
[facility] EMERGENCY DEPARTMENT Provider Note   CSN: [id number] Arrival date & time: 12/20/20  1212     History No chief complaint on file.   Dylan Good is a 50 y.o. male.  50 year old male presents with persistent dyspnea.  Diagnosed with pneumonia recently.  States that his dyspnea is only occasional.  Denies any fever or chills.  No hemoptysis.  No cardiac symptoms.  Went to urgent care and had a chest x-ray which showed findings concerning for other processes.  Was sent here for chest CT.      History reviewed. No pertinent past medical history.  There are no problems to display for this patient.   Past Surgical History:  Procedure Laterality Date   APPENDECTOMY         No family history on file.  Social History   Tobacco Use   Smoking status: Some Days    Packs/day: 0.50    Pack years: 0.00    Types: Cigarettes   Smokeless tobacco: Never  Substance Use Topics   Alcohol use: Never   Drug use: Never    Home Medications Prior to Admission medications   Medication Sig Start Date End Date Taking? Authorizing Provider  amoxicillin (AMOXIL) 500 MG capsule Take 2 capsules (1,000 mg total) by mouth 3 (three) times daily. 12/02/20   Horton, Barbette Hair, MD  azithromycin (ZITHROMAX) 250 MG tablet Take 1 tablet (250 mg total) by mouth daily. Take first 2 tablets together, then 1 every day until finished. 12/02/20   Horton, Barbette Hair, MD  naproxen (NAPROSYN) 500 MG tablet Take 1 tablet (500 mg total) by mouth 2 (two) times daily. 12/02/20   Horton, Barbette Hair, MD    Allergies    Patient has no known allergies.  Review of Systems   Review of Systems  All other systems reviewed and are negative.  Physical Exam Updated Vital Signs BP 135/90 (BP Location: Right Arm)   Pulse 87   Temp 97.9 F (36.6 C) (Oral)   Resp 12   SpO2 96%   Physical Exam Vitals and nursing note reviewed.  Constitutional:      General: He is not in acute  distress.    Appearance: Normal appearance. He is well-developed. He is not toxic-appearing.  HENT:     Head: Normocephalic and atraumatic.  Eyes:     General: Lids are normal.     Conjunctiva/sclera: Conjunctivae normal.     Pupils: Pupils are equal, round, and reactive to light.  Neck:     Thyroid: No thyroid mass.     Trachea: No tracheal deviation.  Cardiovascular:     Rate and Rhythm: Normal rate and regular rhythm.     Heart sounds: Normal heart sounds. No murmur heard.   No gallop.  Pulmonary:     Effort: Pulmonary effort is normal. No respiratory distress.     Breath sounds: Normal breath sounds. No stridor. No decreased breath sounds, wheezing, rhonchi or rales.  Abdominal:     General: There is no distension.     Palpations: Abdomen is soft.     Tenderness: There is no abdominal tenderness. There is no rebound.  Musculoskeletal:        General: No tenderness. Normal range of motion.     Cervical back: Normal range of motion and neck supple.  Skin:    General: Skin is warm and dry.     Findings: No abrasion or rash.  Neurological:  Mental Status: He is alert and oriented to person, place, and time. Mental status is at baseline.     GCS: GCS eye subscore is 4. GCS verbal subscore is 5. GCS motor subscore is 6.     Cranial Nerves: Cranial nerves are intact. No cranial nerve deficit.     Sensory: No sensory deficit.     Motor: Motor function is intact.  Psychiatric:        Attention and Perception: Attention normal.        Speech: Speech normal.        Behavior: Behavior normal.    ED Results / Procedures / Treatments   Labs (all labs ordered are listed, but only abnormal results are displayed) Labs Reviewed  CBC WITH DIFFERENTIAL/PLATELET - Abnormal; Notable for the following components:      Result Value   MCV 79.7 (*)    MCH 24.4 (*)    All other components within normal limits  BASIC METABOLIC PANEL - Abnormal; Notable for the following components:    Glucose, Bld 102 (*)    Calcium 12.8 (*)    All other components within normal limits  TROPONIN I (HIGH SENSITIVITY)  TROPONIN I (HIGH SENSITIVITY)    EKG EKG Interpretation  Date/Time:  Wednesday December 20 2020 13:41:14 EDT Ventricular Rate:  88 PR Interval:  142 QRS Duration: 82 QT Interval:  316 QTC Calculation: 382 R Axis:   68 Text Interpretation: Normal sinus rhythm Septal infarct , age undetermined Abnormal ECG Confirmed by Lacretia Leigh ([id number]) on 12/20/2020 3:59:23 PM  Radiology DG Chest 2 View  Result Date: 12/20/2020 CLINICAL DATA:  Cough and difficulty breathing EXAM: CHEST - 2 VIEW COMPARISON:  12/01/2020 FINDINGS: Cardiac shadow is prominent but obscured by a large pleural effusion which is increased in the interval from the prior exam. Patchy somewhat nodular changes are again identified bilaterally which may again represent multifocal infiltrate. CT may be helpful for further evaluation. No bony abnormality is noted. IMPRESSION: Worsening left-sided pleural effusion and left basilar consolidation. Patchy somewhat nodular densities are identified bilaterally which may represent infiltrate although the possibility of a more aggressive process could not be totally excluded. CT of the chest with contrast is recommended for further evaluation. Electronically Signed   By: Inez Catalina M.D.   On: 12/20/2020 11:52    Procedures Procedures   Medications Ordered in ED Medications - No data to display  ED Course  I have reviewed the triage vital signs and the nursing notes.  Pertinent labs & imaging results that were available during my care of the patient were reviewed by me and considered in my medical decision making (see chart for details).    MDM Rules/Calculators/A&P                          CT results discussed with patient as well as oncologist on-call, Dr. Earlie Server.  Dr. Earlie Server states that he will contact the patient for follow-up in the cancer center. Final  Clinical Impression(s) / ED Diagnoses Final diagnoses:  None    Rx / DC Orders ED Discharge Orders     None        Lacretia Leigh, MD 12/20/20 1743

## 2020-12-20 NOTE — ED Notes (Signed)
Pt verbalizes understanding of discharge instructions. Opportunity for questions and answers were provided. Pt discharged from the ED.   ?

## 2020-12-20 NOTE — ED Notes (Signed)
Patient is being discharged from the Urgent Care and sent to the Emergency Department via pov. Per Jaynee Eagles PA, patient is in need of higher level of care due to Pleural Effusion. Patient is aware and verbalizes understanding of plan of care.  Vitals:   12/20/20 1118  BP: (!) 150/86  Pulse: 92  Resp: 17  Temp: 98.8 F (37.1 C)  SpO2: 98%

## 2020-12-20 NOTE — Discharge Instructions (Addendum)
Unfortunately you have failed outpatient treatment and will need a chest CT scan, further evaluation and testing, management of your pleural effusion.  Please report to the emergency room now.

## 2020-12-20 NOTE — ED Provider Notes (Signed)
Emergency Medicine Provider Triage Evaluation Note  Dylan Good , a 50 y.o. male  was evaluated in triage.  Pt complains of persistent shortness of breath and dry cough ever since being diagnosed with pneumonia on 5/27. Shortness of breath worse when leaning forward and with exertion. He also endorses chest pain only when cough. Patient was evaluated at [facility] ([place] prior to arrival where CXR showed:  IMPRESSION: Worsening left-sided pleural effusion and left basilar consolidation. Patchy somewhat nodular densities are identified bilaterally which may represent infiltrate although the possibility of a more aggressive process could not be totally excluded. CT of the chest with contrast is recommended for further evaluation.  Patient was treated with azithromycin and amoxicillin which he completed; however symptoms still persisted. No fever.  Review of Systems  Positive: SOB, CP, cough Negative: fever  Physical Exam  BP 135/90 (BP Location: Right Arm)   Pulse 87   Temp 97.9 F (36.6 C) (Oral)   Resp 12   SpO2 96%  Gen:   Awake, no distress   Resp:  Normal effort  MSK:   Moves extremities without difficulty  Other:    Medical Decision Making  Medically screening exam initiated at 1:27 PM.  Appropriate orders placed.  Dylan Good was informed that the remainder of the evaluation will be completed by another provider, this initial triage assessment does not replace that evaluation, and the importance of remaining in the ED until their evaluation is complete.  Labs ordered. CT chest w/ contrast per radiology recommendations.    Suzy Bouchard, PA-C 12/20/20 1330    Charlesetta Shanks, MD 12/25/20 [phone]

## 2020-12-20 NOTE — ED Triage Notes (Signed)
Pt here with reports of ongoing shob and cough. Recently dx with PNA, finished abx. Pt xray today showed worsening PNA. Sent here for CT scan.

## 2020-12-21 ENCOUNTER — Telehealth: Payer: Self-pay | Admitting: Oncology

## 2020-12-21 NOTE — Telephone Encounter (Signed)
Scheduled appt per secure chat from [employer]. Pt aware of appt.

## 2020-12-29 ENCOUNTER — Other Ambulatory Visit: Payer: Self-pay

## 2020-12-29 ENCOUNTER — Inpatient Hospital Stay: Payer: No Typology Code available for payment source | Attending: Oncology | Admitting: Oncology

## 2020-12-29 VITALS — BP 162/94 | HR 104 | Resp 19 | Wt 203.8 lb

## 2020-12-29 DIAGNOSIS — J90 Pleural effusion, not elsewhere classified: Secondary | ICD-10-CM | POA: Diagnosis not present

## 2020-12-29 DIAGNOSIS — N2889 Other specified disorders of kidney and ureter: Secondary | ICD-10-CM

## 2020-12-29 DIAGNOSIS — C641 Malignant neoplasm of right kidney, except renal pelvis: Secondary | ICD-10-CM

## 2020-12-29 DIAGNOSIS — R918 Other nonspecific abnormal finding of lung field: Secondary | ICD-10-CM

## 2020-12-29 DIAGNOSIS — F17210 Nicotine dependence, cigarettes, uncomplicated: Secondary | ICD-10-CM

## 2020-12-29 DIAGNOSIS — C649 Malignant neoplasm of unspecified kidney, except renal pelvis: Secondary | ICD-10-CM | POA: Insufficient documentation

## 2020-12-29 NOTE — Progress Notes (Signed)
START ON PATHWAY REGIMEN - Renal Cell     A cycle is every 21 days:     Nivolumab      Ipilimumab    A cycle is every 28 days:     Nivolumab   **Always confirm dose/schedule in your pharmacy ordering system**  Patient Characteristics: Stage IV/Metastatic Disease, Clear Cell, First Line, Intermediate or Poor Risk Therapeutic Status: Stage IV/Metastatic Disease Histology: Clear Cell Line of Therapy: First Line Risk Status: Poor Risk Intent of Therapy: Non-Curative / Palliative Intent, Discussed with Patient

## 2020-12-29 NOTE — Progress Notes (Signed)
Reason for the request:    Kidney mass  HPI: I was asked by Dr. Zenia Resides to evaluate Dylan Good for the evaluation of kidney mass and lung nodules.  Is a 50 year old man without any significant comorbid conditions seen in the emergency department on December 20, 2020.  He presented with shortness of breath and was diagnosed with community-acquired pneumonia without resolution of symptoms.  He underwent CT scan of the chest on 12/20/2020 which showed numerous bilateral pulmonary nodules with a pleural-based metastatic disease in the left hemithorax with a large pleural effusion.  Mediastinal lymphadenopathy were noted with the largest right paratracheal lymph node measuring 3.2 cm.  His kidney was not completely visualized but there is a suggestion of a renal mass that could be responsible as a primary etiology.  Since his visit, he had reported some minor improvement in his respiratory status but continues to have occasional dyspnea on exertion and cough.  He has lost some weight as well.  He denies any hematochezia, melena or hemoptysis.  He denies any hematuria or dysuria.  He does not report any headaches, blurry vision, syncope or seizures. Does not report any fevers, chills or sweats.  Does not report any chest pain, palpitation, orthopnea or leg edema.  Does not report any nausea, vomiting or abdominal pain.  Does not report any constipation or diarrhea.  Does not report any skeletal complaints.    Does not report frequency, urgency or hematuria.  Does not report any skin rashes or lesions. Does not report any heat or cold intolerance.  Does not report any lymphadenopathy or petechiae.  Does not report any anxiety or depression.  Remaining review of systems is negative.    No past medical history for hypertension, diabetes or coronary disease.   Past Surgical History:  Procedure Laterality Date   APPENDECTOMY    :   Current Outpatient Medications:    amoxicillin (AMOXIL) 500 MG capsule, Take 2  capsules (1,000 mg total) by mouth 3 (three) times daily., Disp: 42 capsule, Rfl: 0   azithromycin (ZITHROMAX) 250 MG tablet, Take 1 tablet (250 mg total) by mouth daily. Take first 2 tablets together, then 1 every day until finished., Disp: 6 tablet, Rfl: 0   naproxen (NAPROSYN) 500 MG tablet, Take 1 tablet (500 mg total) by mouth 2 (two) times daily., Disp: 30 tablet, Rfl: 0:  No Known Allergies:  No family history on file.:   Social History   Socioeconomic History   Marital status: Married    Spouse name: Not on file   Number of children: Not on file   Years of education: Not on file   Highest education level: Not on file  Occupational History   Not on file  Tobacco Use   Smoking status: Some Days    Packs/day: 0.50    Pack years: 0.00    Types: Cigarettes   Smokeless tobacco: Never  Substance and Sexual Activity   Alcohol use: Never   Drug use: Never   Sexual activity: Not on file  Other Topics Concern   Not on file  Social History Narrative   Not on file   Social Determinants of Health   Financial Resource Strain: Not on file  Food Insecurity: Not on file  Transportation Needs: Not on file  Physical Activity: Not on file  Stress: Not on file  Social Connections: Not on file  Intimate Partner Violence: Not on file  :  Pertinent items are noted in HPI.  Exam: Blood  pressure (!) 162/94, pulse (!) 104, resp. rate 19, weight 203 lb 12.8 oz (92.4 kg), SpO2 98 %. ECOG 1 General appearance: alert and cooperative appeared without distress. Head: atraumatic without any abnormalities. Eyes: conjunctivae/corneas clear. PERRL.  Sclera anicteric. Throat: lips, mucosa, and tongue normal; without oral thrush or ulcers. Resp: clear to auscultation bilaterally.  Decreased breath sound on the left side. Cardio: regular rate and rhythm, S1, S2 normal, no murmur, click, rub or gallop GI: soft, non-tender; bowel sounds normal; no masses,  no organomegaly Skin: Skin color,  texture, turgor normal. No rashes or lesions Lymph nodes: Cervical, supraclavicular, and axillary nodes normal. Neurologic: Grossly normal without any motor, sensory or deep tendon reflexes. Musculoskeletal: No joint deformity or effusion.   DG Chest 2 View  Result Date: 12/20/2020 CLINICAL DATA:  Cough and difficulty breathing EXAM: CHEST - 2 VIEW COMPARISON:  12/01/2020 FINDINGS: Cardiac shadow is prominent but obscured by a large pleural effusion which is increased in the interval from the prior exam. Patchy somewhat nodular changes are again identified bilaterally which may again represent multifocal infiltrate. CT may be helpful for further evaluation. No bony abnormality is noted. IMPRESSION: Worsening left-sided pleural effusion and left basilar consolidation. Patchy somewhat nodular densities are identified bilaterally which may represent infiltrate although the possibility of a more aggressive process could not be totally excluded. CT of the chest with contrast is recommended for further evaluation. Electronically Signed   By: Dylan Good M.D.   On: 12/20/2020 11:52   DG Chest 2 View  Result Date: 12/01/2020 CLINICAL DATA:  Chest pain is and cough for 2-3 days EXAM: CHEST - 2 VIEW COMPARISON:  12/23/2011 FINDINGS: Left pleural effusion seen partially tracking into the fissures. While much of the adjacent opacity is likely atelectatic some underlying airspace disease is not fully excluded particularly given additional patchy opacities in the left upper lung and right mid to lower lung with diffuse airways thickening. No pneumothorax or right effusion. Partial obscuration of the left heart border. The aorta is calcified. The remaining cardiomediastinal contours are unremarkable. Degenerative changes are present in the imaged spine and shoulders. IMPRESSION: Multifocal patchy opacities in the lungs could reflect multifocal pneumonia in the appropriate clinical setting. Moderate left pleural  effusion with some adjacent passive atelectasis. Underlying airspace disease within the atelectatic lung may be present as well. Electronically Signed   By: Dylan Good M.D.   On: 12/01/2020 18:10   CT Chest W Contrast  Result Date: 12/20/2020 CLINICAL DATA:  Pleural effusion EXAM: CT CHEST WITH CONTRAST TECHNIQUE: Multidetector CT imaging of the chest was performed during intravenous contrast administration. CONTRAST:  33mL OMNIPAQUE IOHEXOL 300 MG/ML  SOLN COMPARISON:  None. FINDINGS: Cardiovascular: No significant vascular findings. Normal heart size. No pericardial effusion. Mediastinum/Nodes: Extensive mediastinal lymphadenopathy with the largest right paratracheal lymph node measuring 3.2 cm. Largest subcarinal lymph node measures 2.7 cm. Enlarged right hilar lymph node measures 18 mm. 12 mm hypodense right thyroid nodule. Trachea and esophagus are unremarkable. Lungs/Pleura: Large left pleural effusion. Multiple left pleural based masses with the largest conglomeration measuring 2.1 x 9.2 cm most consistent with pleural based metastases. Numerous bilateral pulmonary nodules of varying sizes with the largest in the right upper lobe measuring 2 x 1.6 cm. Upper Abdomen: Right kidney is incompletely visualized, but there is suggestion of a right renal mass concerning for malignancy given the pulmonary findings. Musculoskeletal: No acute osseous abnormality. IMPRESSION: 1. Numerous bilateral pulmonary nodules most concerning for metastatic disease. Pleural based  metastatic disease in the left hemithorax with a large pleural effusion. 2. Right kidney is incompletely visualized, but there is suggestion of a right renal mass concerning for malignancy given the pulmonary findings. Overall findings are concerning for renal cell carcinoma with metastatic disease. Recommend further evaluation with a CT of the abdomen/pelvis with a renal mass protocol. Oncology consultation recommended. Electronically Signed   By:  Dylan Good   On: 12/20/2020 16:28    Assessment and Plan:    50 year old man with:  1.  Metastatic malignancy involving the lung presenting with numerous bilateral pulmonary nodules as well as thoracic lymphadenopathy in the setting of a possible right kidney mass.  The differential diagnosis of these findings were discussed at this time.  Metastatic renal cell carcinoma is a likely etiology given the suspicious kidney mass but other malignancies could be considered.  Metastatic melanoma GU, GI versus other malignancy with metastatic disease could be a possibility.  From a management standpoint, I recommended completing his staging scans including whole-body imaging including a PET scan and MRI of the brain to rule out any CNS disease.  Obtaining tissue biopsy would also be pursued to document the etiology of his malignancy.  Management options at this time will be discussed once the final pathology is completed.  If we are dealing with stage IV renal cell carcinoma treatment options including oral targeted therapy alone, in combination with immunotherapy or combination immunotherapy by itself.  Regardless, we are likely dealing with advanced malignancy that is not curable and any treatment was likely palliative.  After discussion today, I will tentatively schedule him to start combination immunotherapy with ipilimumab and nivolumab in the near future pending the results of his actual pathology.  2.  Respiratory considerations: He does have a large pleural effusion on the left and would benefit from thoracentesis for both diagnostic and therapeutic purposes.  We will make the appropriate referral for thoracentesis.  His respiratory status is overall stable at rest.  I given her instructions to report to the emergency department if his symptoms worsen.  3.  Hypercalcemia: Related to malignancy although minimally symptomatic at this time.  I will schedule Zometa infusion in the near  future.   4.  Follow-up: Will be in the near future after complete imaging studies and obtaining tissue biopsy.   60  minutes were dedicated to this visit. The time was spent on reviewing  imaging studies, discussing treatment options, discussing differential diagnosis and answering questions regarding future plan.      A copy of this consult has been forwarded to the requesting physician.

## 2021-01-01 ENCOUNTER — Encounter: Payer: Self-pay | Admitting: Oncology

## 2021-01-02 ENCOUNTER — Ambulatory Visit (HOSPITAL_COMMUNITY)
Admission: RE | Admit: 2021-01-02 | Discharge: 2021-01-02 | Disposition: A | Discharge status: Home / Self Care | Payer: No Typology Code available for payment source | Source: Ambulatory Visit | Attending: Oncology | Admitting: Oncology

## 2021-01-02 ENCOUNTER — Other Ambulatory Visit: Payer: Self-pay

## 2021-01-02 ENCOUNTER — Ambulatory Visit (HOSPITAL_COMMUNITY)
Admission: RE | Admit: 2021-01-02 | Discharge: 2021-01-02 | Disposition: A | Discharge status: Home / Self Care | Payer: No Typology Code available for payment source | Source: Ambulatory Visit | Attending: Radiology | Admitting: Radiology

## 2021-01-02 DIAGNOSIS — J90 Pleural effusion, not elsewhere classified: Secondary | ICD-10-CM | POA: Diagnosis present

## 2021-01-02 DIAGNOSIS — R918 Other nonspecific abnormal finding of lung field: Secondary | ICD-10-CM | POA: Insufficient documentation

## 2021-01-02 DIAGNOSIS — Z9889 Other specified postprocedural states: Secondary | ICD-10-CM

## 2021-01-02 IMAGING — DX DG CHEST 1V
1 series · 1 of 1 positions shown · non-contrast
Comparison: [DATE]

CLINICAL DATA: Status post left thoracentesis

EXAM:
CHEST  1 VIEW

[chest pa]
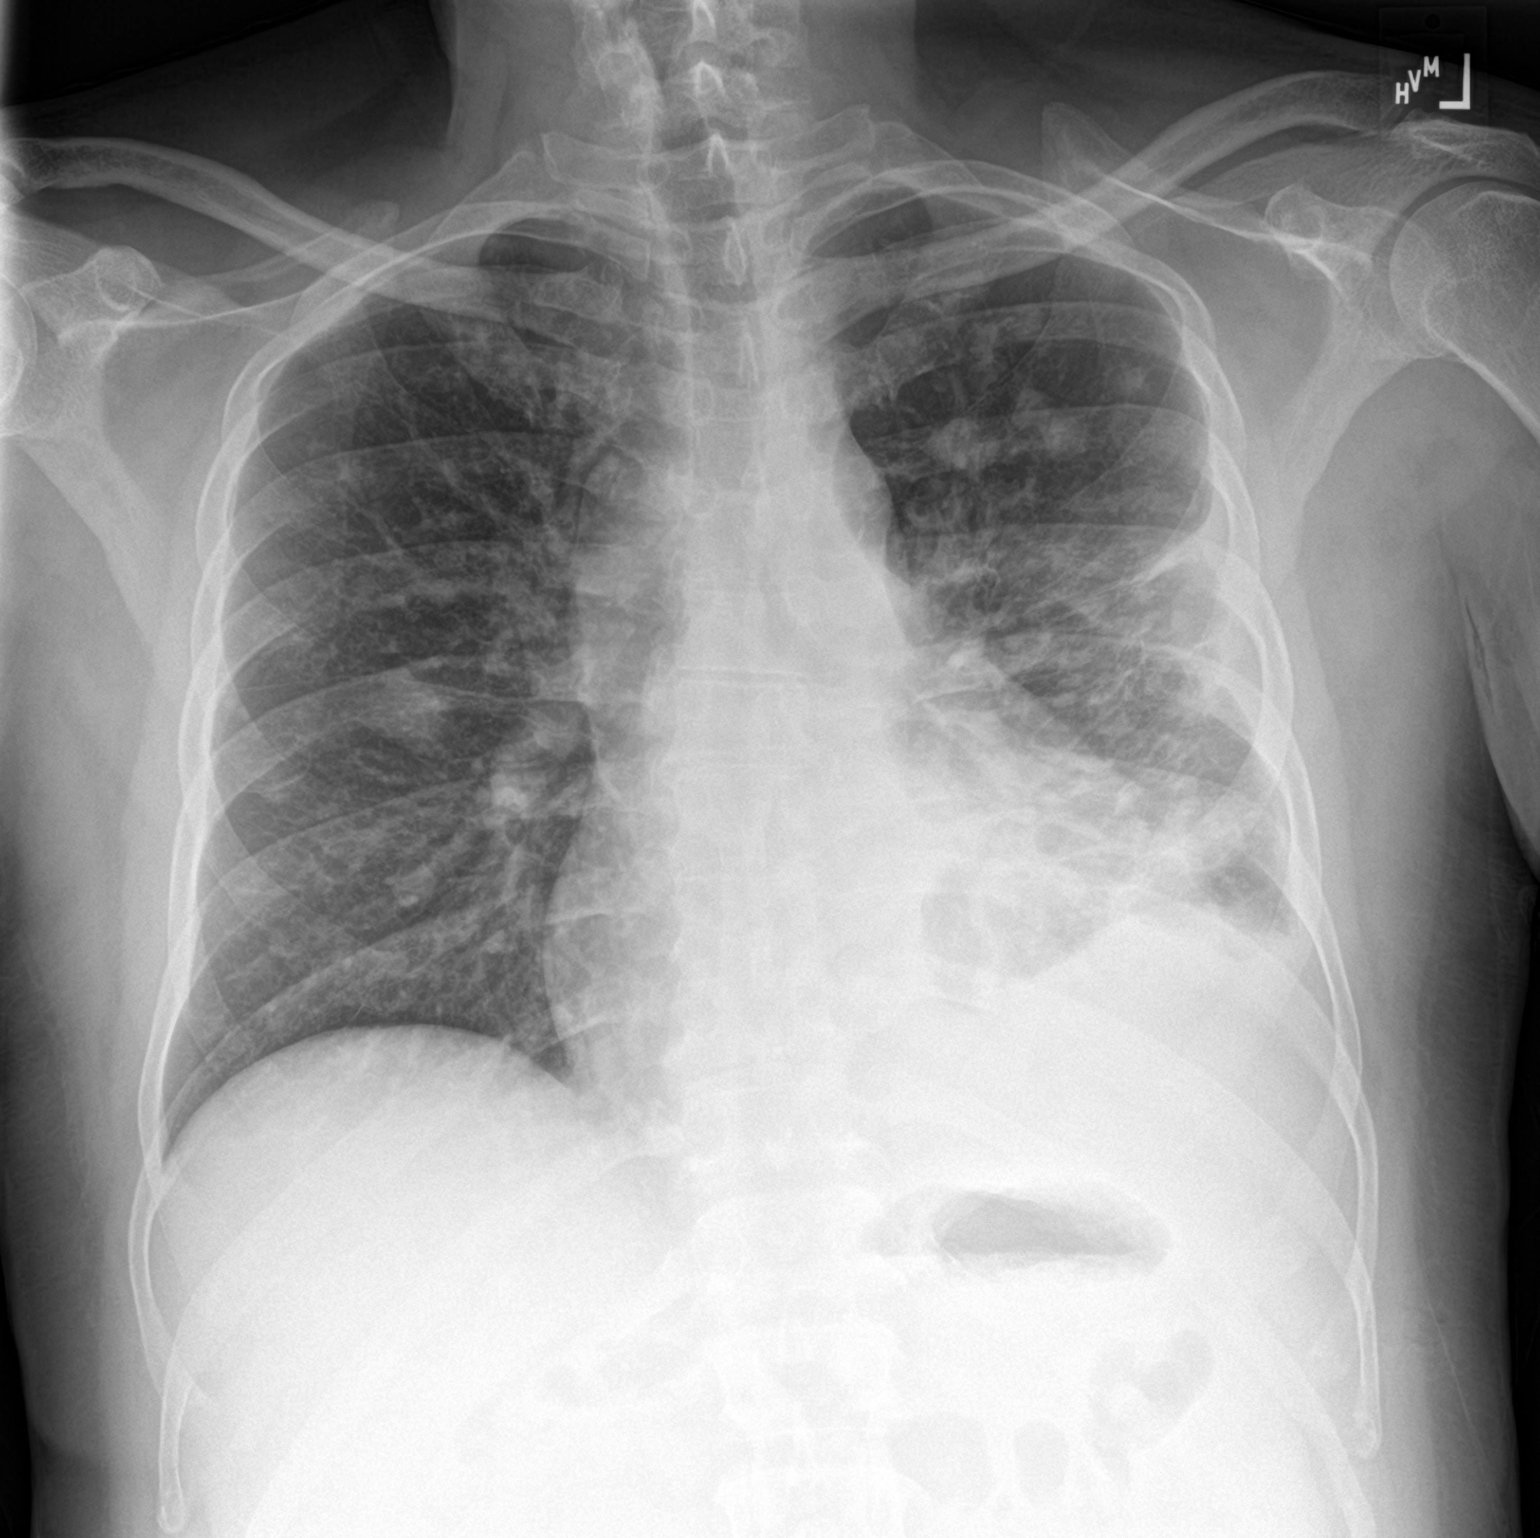

[1 of 1 positions shown; findings below may reference images not displayed]

FINDINGS: Mild cardiomegaly. Interval reduction in volume of a left pleural
effusion, now small, with residual associated atelectasis or
consolidation. Numerous bilateral pulmonary nodules not
significantly changed in radiographic appearance.
IMPRESSION: 1. Interval reduction in volume of a left pleural effusion, now
small, with residual associated atelectasis or consolidation. No
pneumothorax.
2. Numerous bilateral pulmonary nodules not significantly changed in
radiographic appearance.

## 2021-01-02 IMAGING — US US THORACENTESIS ASP PLEURAL SPACE W/IMG GUIDE
1 series · 3 of 3 positions shown · non-contrast
Comparison: none

INDICATION: Patient with history of pulmonary nodules, right renal mass,
dyspnea, left pleural effusion. Request received for diagnostic and
therapeutic left thoracentesis.

[Series 1: us thoracentesis asp pleural s mc & wl · 3 of 3 slices shown]
[im 1/3]
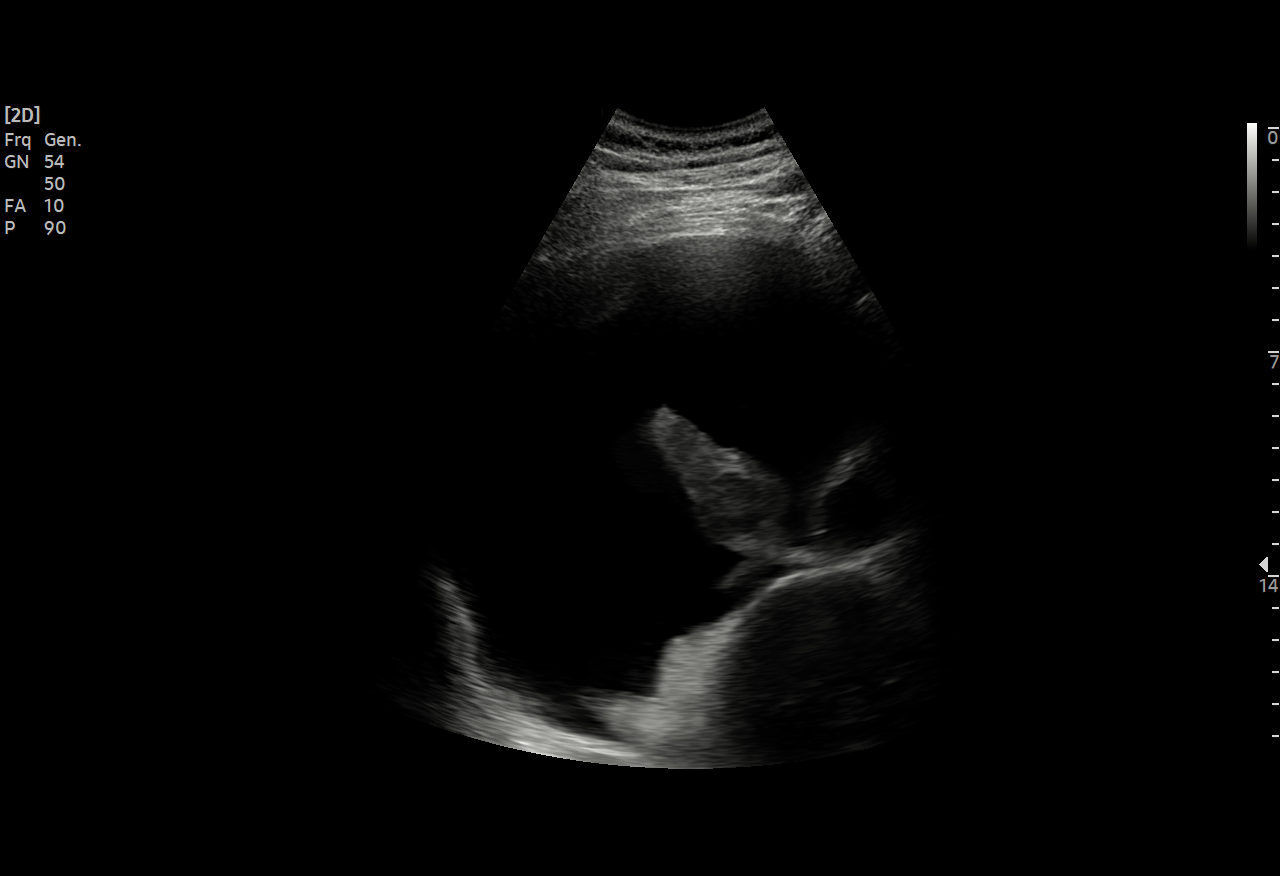
[im 2/3]
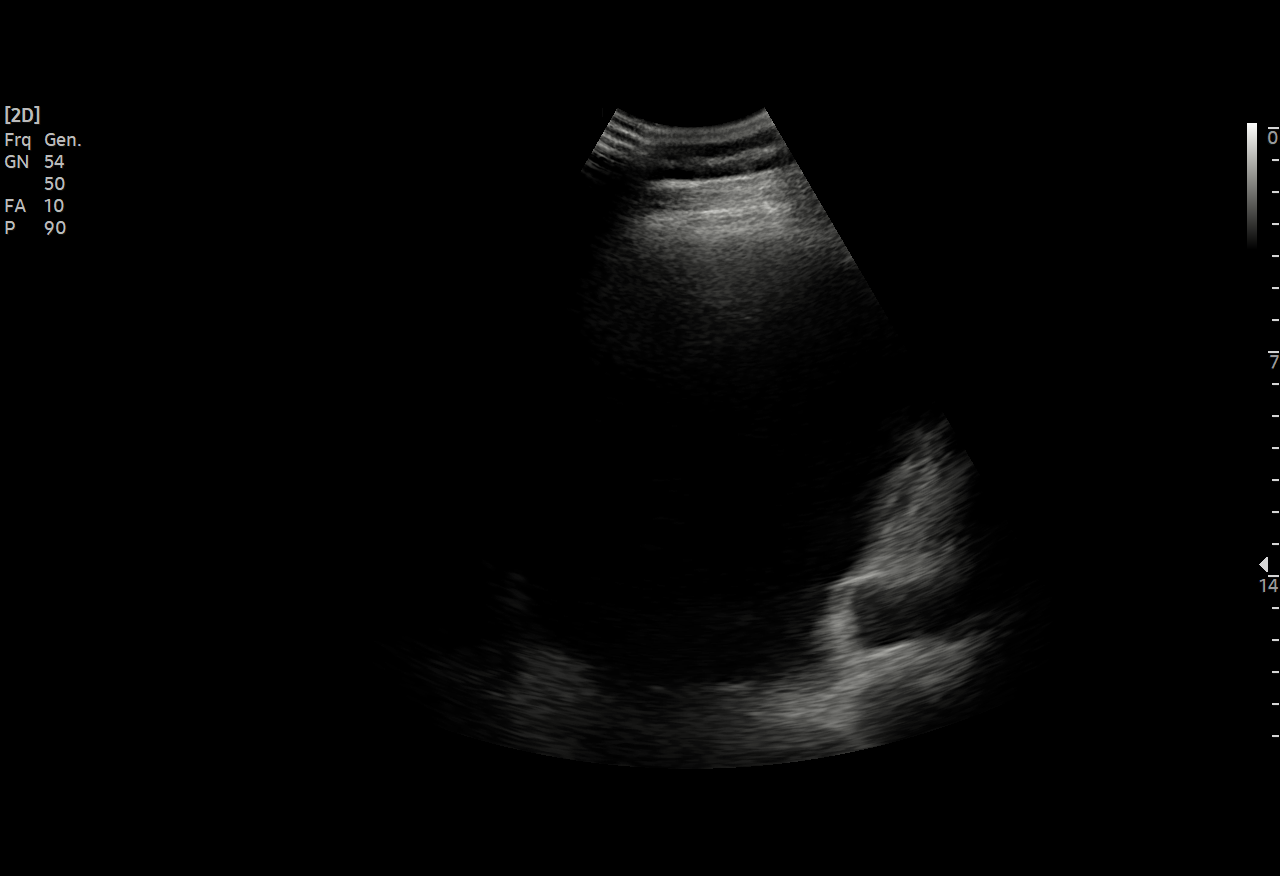
[im 3/3]
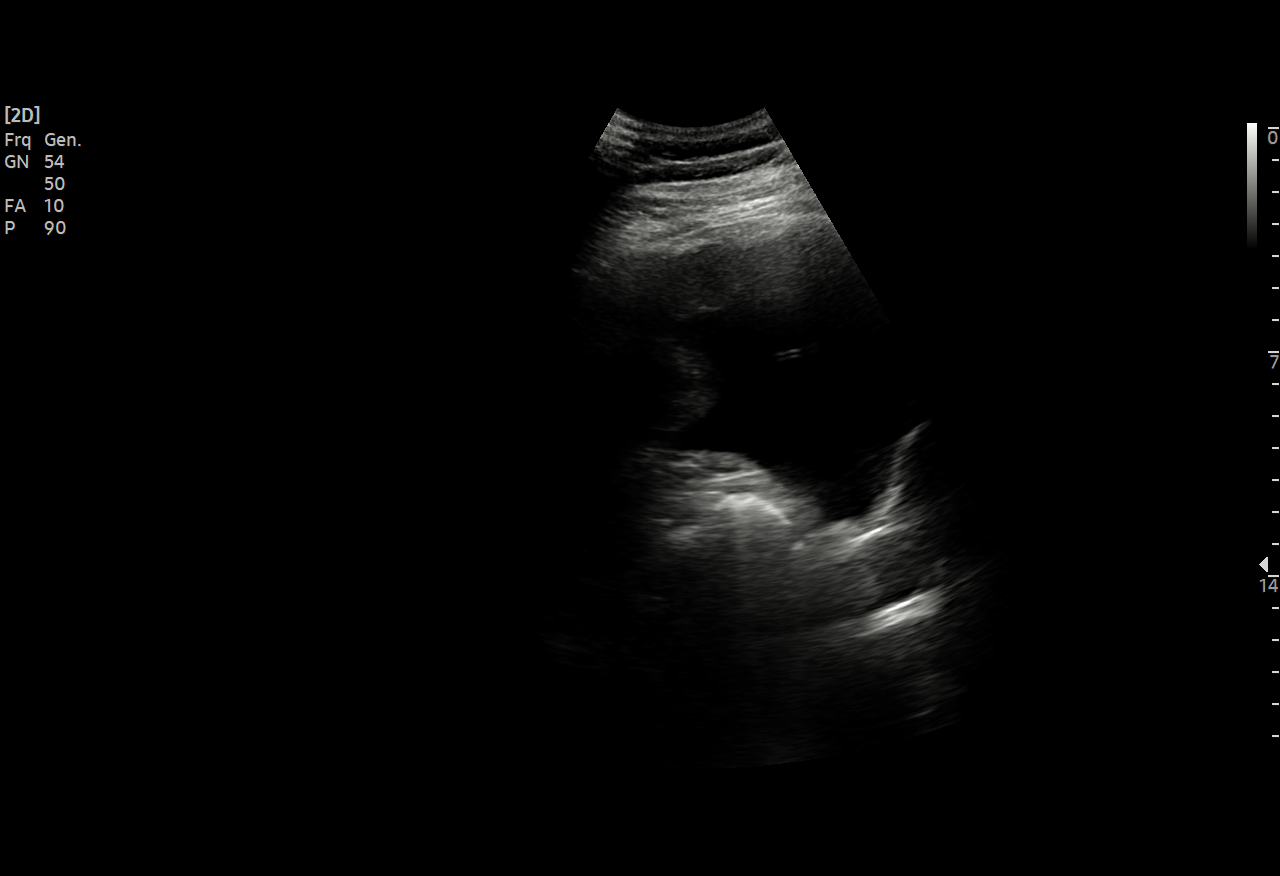

[3 of 3 positions shown; findings below may reference images not displayed]

EXAM:
ULTRASOUND GUIDED DIAGNOSTIC AND THERAPEUTIC LEFT THORACENTESIS

MEDICATIONS:
1% lidocaine to skin and subcutaneous tissue

COMPLICATIONS:
None immediate.

PROCEDURE:
An ultrasound guided thoracentesis was thoroughly discussed with the
patient and questions answered. The benefits, risks, alternatives
and complications were also discussed. The patient understands and
wishes to proceed with the procedure. Written consent was obtained.

Ultrasound was performed to localize and mark an adequate pocket of
fluid in the left chest. The area was then prepped and draped in the
normal sterile fashion. 1% Lidocaine was used for local anesthesia.
Under ultrasound guidance a 6 Fr Safe-T-Centesis catheter was
introduced. Thoracentesis was performed. The catheter was removed
and a dressing applied.
FINDINGS: A total of approximately 1.7 liters of bloody fluid was removed.
Samples were sent to the laboratory as requested by the clinical
team.
IMPRESSION: Successful ultrasound guided diagnostic and therapeutic left
thoracentesis yielding 1.7 liters of pleural fluid.

## 2021-01-02 MED ORDER — LIDOCAINE HCL 1 % IJ SOLN
INTRAMUSCULAR | Status: AC
  Filled 2021-01-02: qty 20

## 2021-01-02 NOTE — Procedures (Signed)
Ultrasound-guided diagnostic and therapeutic left thoracentesis performed yielding 1.7 liters of bloody fluid. No immediate complications. Follow-up chest x-ray pending. The fluid was sent to the lab for cytology. EBL< 1 cc.

## 2021-01-05 NOTE — Progress Notes (Signed)
Pharmacist Chemotherapy Monitoring - Initial Assessment    Anticipated start date: 01/15/21   The following has been reviewed per standard work regarding the patient's treatment regimen: The patient's diagnosis, treatment plan and drug doses, and organ/hematologic function Lab orders and baseline tests specific to treatment regimen  The treatment plan start date, drug sequencing, and pre-medications Prior authorization status  Patient's documented medication list, including drug-drug interaction screen and prescriptions for anti-emetics and supportive care specific to the treatment regimen The drug concentrations, fluid compatibility, administration routes, and timing of the medications to be used The patient's access for treatment and lifetime cumulative dose history, if applicable  The patient's medication allergies and previous infusion related reactions, if applicable   Changes made to treatment plan:  N/A  Follow up needed:  Pending authorization for treatment    Dylan Good, [place], 01/05/2021  1:57 PM

## 2021-01-09 ENCOUNTER — Inpatient Hospital Stay: Payer: PRIVATE HEALTH INSURANCE

## 2021-01-09 ENCOUNTER — Other Ambulatory Visit: Payer: Self-pay | Admitting: *Deleted

## 2021-01-09 ENCOUNTER — Other Ambulatory Visit: Payer: Self-pay

## 2021-01-09 ENCOUNTER — Encounter: Payer: Self-pay | Admitting: Oncology

## 2021-01-09 DIAGNOSIS — R0609 Other forms of dyspnea: Secondary | ICD-10-CM | POA: Insufficient documentation

## 2021-01-09 DIAGNOSIS — R059 Cough, unspecified: Secondary | ICD-10-CM | POA: Insufficient documentation

## 2021-01-09 DIAGNOSIS — R630 Anorexia: Secondary | ICD-10-CM | POA: Insufficient documentation

## 2021-01-09 DIAGNOSIS — C649 Malignant neoplasm of unspecified kidney, except renal pelvis: Secondary | ICD-10-CM | POA: Insufficient documentation

## 2021-01-09 DIAGNOSIS — J90 Pleural effusion, not elsewhere classified: Secondary | ICD-10-CM | POA: Insufficient documentation

## 2021-01-09 DIAGNOSIS — C7931 Secondary malignant neoplasm of brain: Secondary | ICD-10-CM | POA: Insufficient documentation

## 2021-01-09 DIAGNOSIS — R110 Nausea: Secondary | ICD-10-CM | POA: Insufficient documentation

## 2021-01-09 DIAGNOSIS — Z5112 Encounter for antineoplastic immunotherapy: Secondary | ICD-10-CM | POA: Insufficient documentation

## 2021-01-09 DIAGNOSIS — N2889 Other specified disorders of kidney and ureter: Secondary | ICD-10-CM

## 2021-01-09 LAB — CYTOLOGY - NON PAP

## 2021-01-09 MED ORDER — PROCHLORPERAZINE MALEATE 10 MG PO TABS: 10.0000 mg | ORAL_TABLET | Freq: Four times a day (QID) | ORAL | 0 refills | Status: DC | PRN

## 2021-01-09 NOTE — Progress Notes (Signed)
Met with patient at registration to introduce myself as Arboriculturist and to offer available resources.  Discussed one-time $1000 Radio broadcast assistant to assist with personal expenses while going through treatment. Also, discussed available copay assistance for specific treatment drugs if insurance leaves him with a co-insurance responsibility.  Gave him my card if interested in applying and for any additional financial questions or concerns.

## 2021-01-10 ENCOUNTER — Ambulatory Visit (HOSPITAL_COMMUNITY)
Admission: RE | Admit: 2021-01-10 | Discharge: 2021-01-10 | Disposition: A | Discharge status: Home / Self Care | Payer: PRIVATE HEALTH INSURANCE | Source: Ambulatory Visit | Attending: Oncology | Admitting: Oncology

## 2021-01-10 DIAGNOSIS — R918 Other nonspecific abnormal finding of lung field: Secondary | ICD-10-CM | POA: Diagnosis not present

## 2021-01-10 IMAGING — MR MR HEAD WO/W CM
14 series · 48 of 48 positions shown · IV contrast (gadavist)
Comparison: None.

CLINICAL DATA: Urologic cancer. Thoracic metastases and suspected
right renal mass.

EXAM:
MRI HEAD WITHOUT AND WITH CONTRAST
TECHNIQUE: Multiplanar, multiecho pulse sequences of the brain and surrounding
structures were obtained without and with intravenous contrast.
CONTRAST:  10mL GADAVIST GADOBUTROL 1 MMOL/ML IV SOLN

[Series 5: DWI · axial · 3.0mm · 1.36mm/px · z∈[-71,+76]mm · 7 of 100 slices shown (1 of 2)]
[im 1/100]
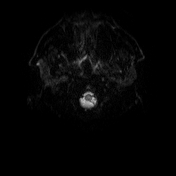
[im 17/100]
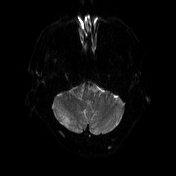
[im 34/100]
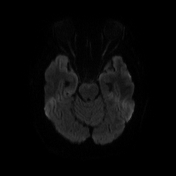
[im 50/100]
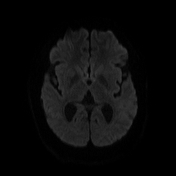
[im 67/100]
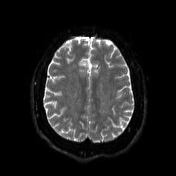
[im 83/100]
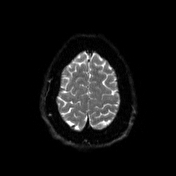
[im 100/100]
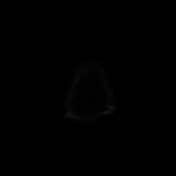

[Series 6: DWI · axial · 3.0mm · 1.36mm/px · z∈[-71,+76]mm · 3 of 50 slices shown (2 of 2)]
[im 1/50]
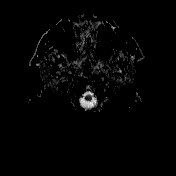
[im 25/50]
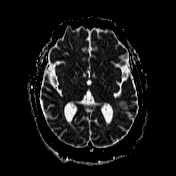
[im 50/50]
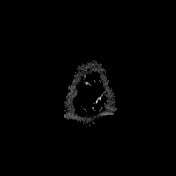

[Series 7: T1 · sagittal · 5.0mm · 0.75mm/px · 1 of 24 slices shown (1 of 2)]
[im 1/24]
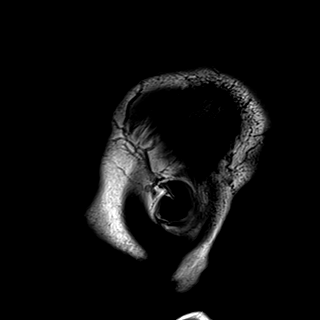

[Series 8: T2 · axial · 5.0mm · 0.62mm/px · 1 of 26 slices shown]
[im 1/26]
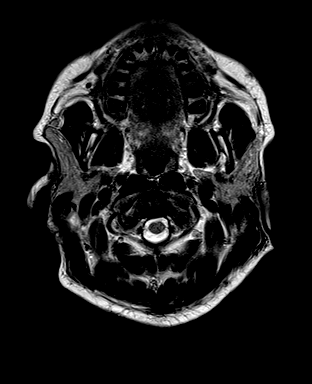

[Series 9: FLAIR · axial · 3.0mm · 0.75mm/px · z∈[-75,+78]mm · 3 of 52 slices shown]
[im 1/52]
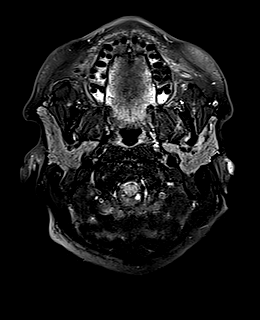
[im 26/52]
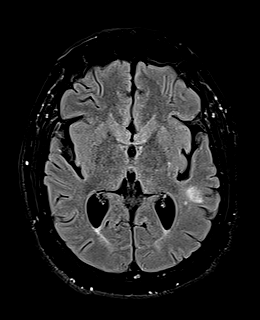
[im 52/52]
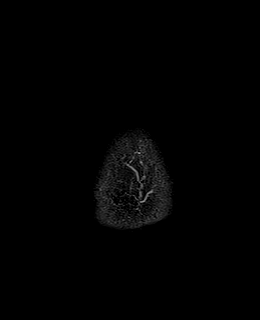

[Series 10: swi_images · axial · 3.0mm · 0.75mm/px · z∈[-87,+90]mm · 3 of 60 slices shown]
[im 1/60]
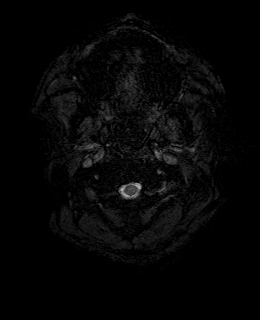
[im 30/60]
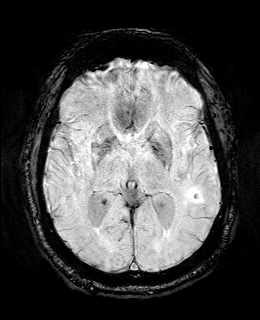
[im 60/60]
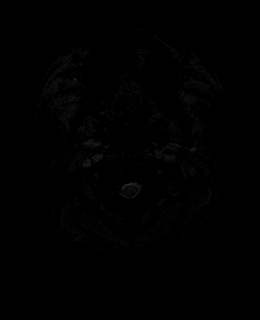

[Series 11: mip_images(sw) · axial · 24.0mm · 0.75mm/px · z∈[-76,+79]mm · 3 of 53 slices shown]
[im 1/53]
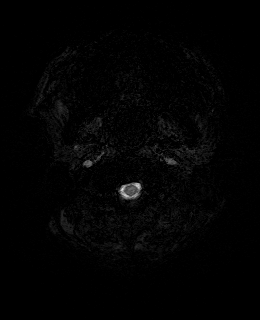
[im 27/53]
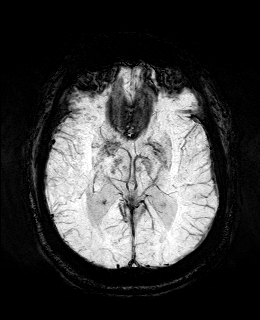
[im 53/53]
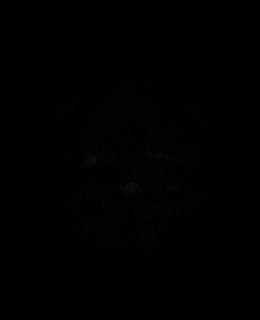

[Series 12: T1 · axial · 1.0mm · 0.94mm/px · z∈[-78,+81]mm · 9 of 160 slices shown (2 of 2)]
[im 1/160]
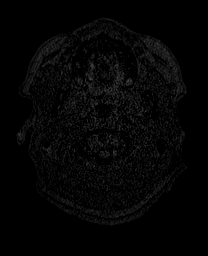
[im 20/160]
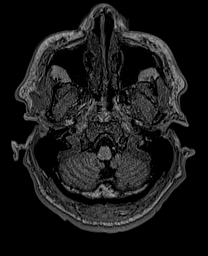
[im 40/160]
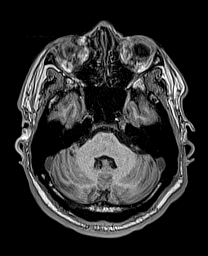
[im 60/160]
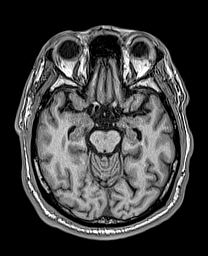
[im 80/160]
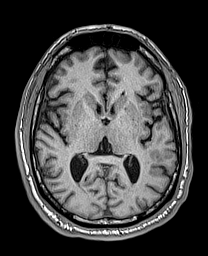
[im 100/160]
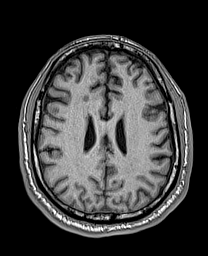
[im 120/160]
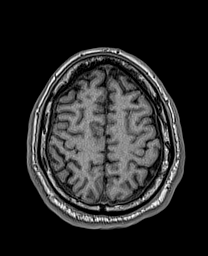
[im 140/160]
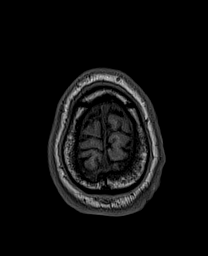
[im 160/160]
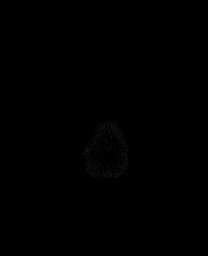

[Series 13: cor dwi_tracew · coronal · 5.0mm · 1.53mm/px · 3 of 52 slices shown]
[im 1/52]
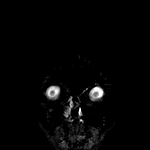
[im 26/52]
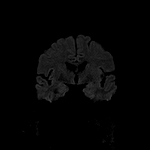
[im 52/52]
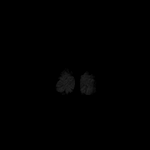

[Series 14: cor dwi_adc · coronal · 5.0mm · 1.53mm/px · 1 of 26 slices shown]
[im 1/26]
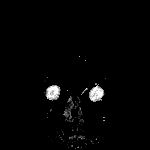

[Series 15: T2 post-contrast · coronal · 5.0mm · 0.57mm/px · 2 of 28 slices shown]
[im 1/28]
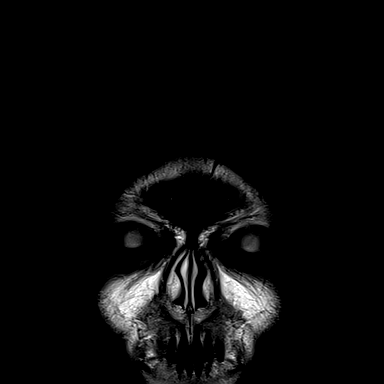
[im 28/28]
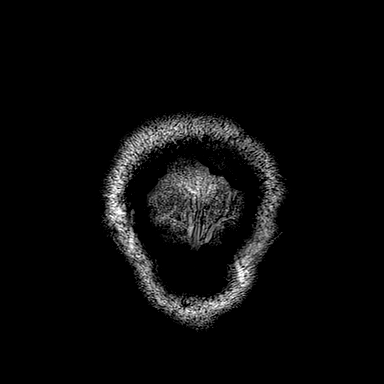

[Series 16: T1 post-contrast · axial · 1.0mm · 0.94mm/px · z∈[-78,+81]mm · 9 of 160 slices shown (1 of 3)]
[im 1/160]
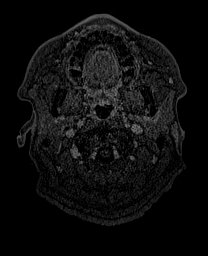
[im 20/160]
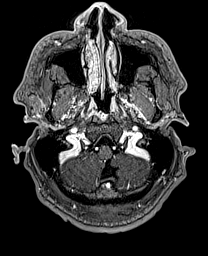
[im 40/160]
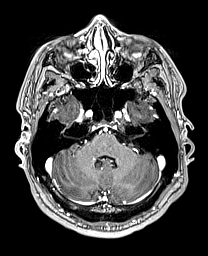
[im 60/160]
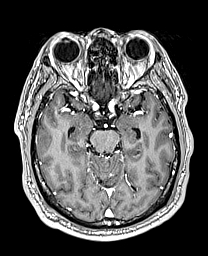
[im 80/160]
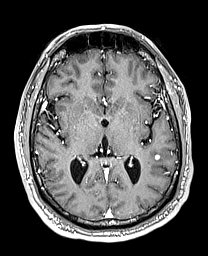
[im 100/160]
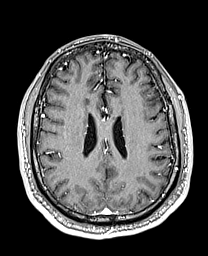
[im 120/160]
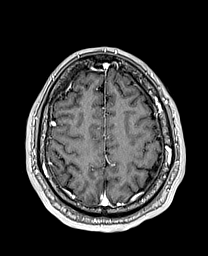
[im 140/160]
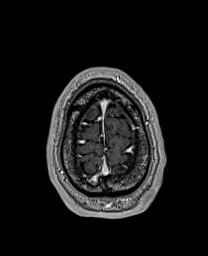
[im 160/160]
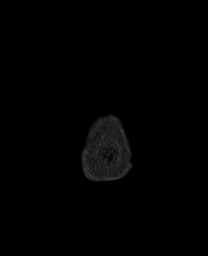

[Series 17: T1 post-contrast · coronal · 5.0mm · 0.43mm/px · 2 of 28 slices shown (2 of 3)]
[im 1/28]
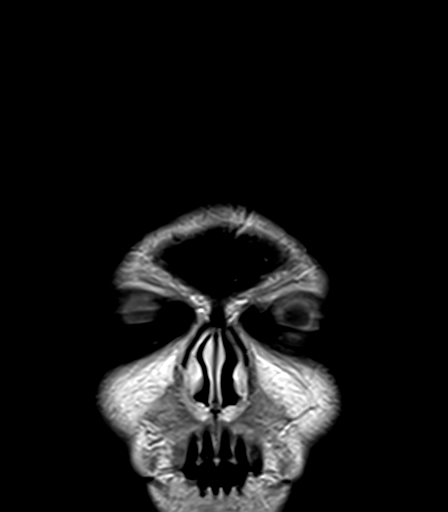
[im 28/28]
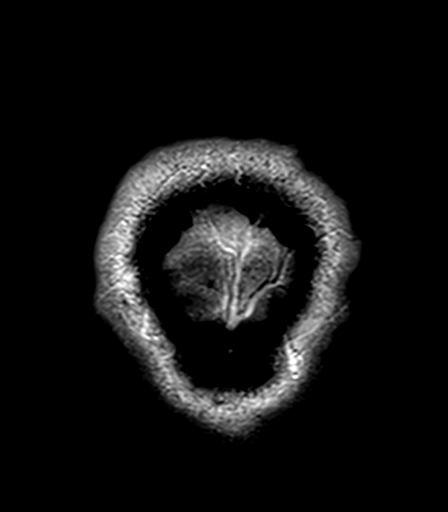

[Series 18: T1 post-contrast · sagittal · 5.0mm · 0.75mm/px · 1 of 24 slices shown (3 of 3)]
[im 1/24]
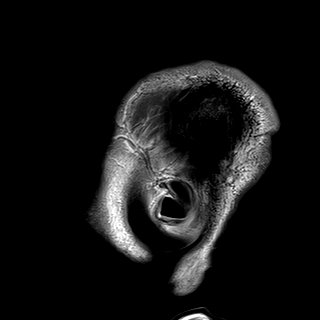

[48 of 48 positions shown; findings below may reference images not displayed]

FINDINGS: Brain: There is a 5 mm solidly enhancing lesion in the posterior
aspect of the left superior temporal gyrus with mild surrounding
edema and a small amount of susceptibility artifact which may
reflect chronic blood products. No second enhancing intracranial
lesion is identified.

There are multiple small foci of T2 FLAIR hyperintensity scattered
throughout the cerebral white matter bilaterally with most notable
involvement of the periventricular white matter including an 8 mm
focus adjacent to the right frontal horn. White matter disease is
overall mild in severity but is abnormal for age. No acute infarct,
midline shift, or extra-axial fluid collection is identified.
Cerebral volume is borderline low for age.

Vascular: Major intracranial vascular flow voids are preserved.

Skull and upper cervical spine: No suspicious marrow lesion.

Sinuses/Orbits: Unremarkable orbits. Paranasal sinuses and mastoid
air cells are clear.

Other: None.
IMPRESSION: 1. 5 mm enhancing left temporal lobe lesion with mild edema most
compatible with a solitary metastasis.
2. Mild cerebral white matter T2 signal changes, nonspecific.
Considerations include early chronic small vessel ischemia,
migraines, prior infection/inflammation, and demyelinating disease.

## 2021-01-10 MED ORDER — GADOBUTROL 1 MMOL/ML IV SOLN
10.0000 mL | Freq: Once | INTRAVENOUS | Status: AC | PRN
  Administered 2021-01-10: 10 mL via INTRAVENOUS

## 2021-01-11 ENCOUNTER — Other Ambulatory Visit: Payer: Self-pay | Admitting: Oncology

## 2021-01-11 ENCOUNTER — Other Ambulatory Visit: Payer: Self-pay | Admitting: Radiation Therapy

## 2021-01-11 ENCOUNTER — Telehealth: Payer: Self-pay

## 2021-01-11 DIAGNOSIS — N2889 Other specified disorders of kidney and ureter: Secondary | ICD-10-CM

## 2021-01-11 DIAGNOSIS — G9389 Other specified disorders of brain: Secondary | ICD-10-CM

## 2021-01-11 NOTE — Telephone Encounter (Signed)
Clarified patient's treatment plan and schedule. Patient verbalized an understanding that he will proceed with chemotherapy as planned on 7/11. Patient will undergo PET scan on 7/13 and will have his CT biopsy scheduled after the scan. Confirmed this with [name]. Patient agreeable and had no further questions.

## 2021-01-11 NOTE — Progress Notes (Signed)
The results of the MRI were personally reviewed and discussed with the patient today.  He does not report any symptoms at this time including headaches or blurry vision.  Given the small nature of his isolated questionable area of metastasis, systemic therapy may be reasonable to address this solitary lesion.  I will refer him to Dr. Mickeal Skinner from neurooncology for multidisciplinary discussion given his high-volume systemic metastatic disease.  If it felt that this lesion could be problematic and addressing it locally is paramount, we will do so simultaneously with his systemic therapy.  We will try to avoid high doses of steroid given the plans for immunotherapy.

## 2021-01-12 ENCOUNTER — Other Ambulatory Visit: Payer: Self-pay | Admitting: Radiation Therapy

## 2021-01-12 DIAGNOSIS — C7949 Secondary malignant neoplasm of other parts of nervous system: Secondary | ICD-10-CM

## 2021-01-12 DIAGNOSIS — C7931 Secondary malignant neoplasm of brain: Secondary | ICD-10-CM

## 2021-01-15 ENCOUNTER — Inpatient Hospital Stay: Payer: PRIVATE HEALTH INSURANCE

## 2021-01-15 ENCOUNTER — Telehealth: Payer: Self-pay | Admitting: Internal Medicine

## 2021-01-15 ENCOUNTER — Other Ambulatory Visit: Payer: Self-pay

## 2021-01-15 ENCOUNTER — Inpatient Hospital Stay (HOSPITAL_BASED_OUTPATIENT_CLINIC_OR_DEPARTMENT_OTHER): Payer: PRIVATE HEALTH INSURANCE | Admitting: Oncology

## 2021-01-15 DIAGNOSIS — J90 Pleural effusion, not elsewhere classified: Secondary | ICD-10-CM

## 2021-01-15 DIAGNOSIS — C7931 Secondary malignant neoplasm of brain: Secondary | ICD-10-CM | POA: Insufficient documentation

## 2021-01-15 DIAGNOSIS — Z510 Encounter for antineoplastic radiation therapy: Secondary | ICD-10-CM | POA: Diagnosis present

## 2021-01-15 DIAGNOSIS — N2889 Other specified disorders of kidney and ureter: Secondary | ICD-10-CM

## 2021-01-15 DIAGNOSIS — Z5111 Encounter for antineoplastic chemotherapy: Secondary | ICD-10-CM | POA: Diagnosis not present

## 2021-01-15 DIAGNOSIS — C641 Malignant neoplasm of right kidney, except renal pelvis: Secondary | ICD-10-CM

## 2021-01-15 DIAGNOSIS — C649 Malignant neoplasm of unspecified kidney, except renal pelvis: Secondary | ICD-10-CM | POA: Insufficient documentation

## 2021-01-15 DIAGNOSIS — R918 Other nonspecific abnormal finding of lung field: Secondary | ICD-10-CM

## 2021-01-15 LAB — CBC WITH DIFFERENTIAL (CANCER CENTER ONLY)
Abs Immature Granulocytes: 0.02 10*3/uL (ref 0.00–0.07)
Basophils Absolute: 0.1 10*3/uL (ref 0.0–0.1)
Basophils Relative: 1 %
Eosinophils Absolute: 0.2 10*3/uL (ref 0.0–0.5)
Eosinophils Relative: 4 %
HCT: 37.8 % — ABNORMAL LOW (ref 39.0–52.0)
Hemoglobin: 12.2 g/dL — ABNORMAL LOW (ref 13.0–17.0)
Immature Granulocytes: 0 %
Lymphocytes Relative: 21 %
Lymphs Abs: 1.3 10*3/uL (ref 0.7–4.0)
MCH: 24.4 pg — ABNORMAL LOW (ref 26.0–34.0)
MCHC: 32.3 g/dL (ref 30.0–36.0)
MCV: 75.8 fL — ABNORMAL LOW (ref 80.0–100.0)
Monocytes Absolute: 0.8 10*3/uL (ref 0.1–1.0)
Monocytes Relative: 13 %
Neutro Abs: 3.6 10*3/uL (ref 1.7–7.7)
Neutrophils Relative %: 61 %
Platelet Count: 301 10*3/uL (ref 150–400)
RBC: 4.99 MIL/uL (ref 4.22–5.81)
RDW: 14.1 % (ref 11.5–15.5)
WBC Count: 5.9 10*3/uL (ref 4.0–10.5)
nRBC: 0 % (ref 0.0–0.2)

## 2021-01-15 LAB — CMP (CANCER CENTER ONLY)
ALT: 42 U/L (ref 0–44)
AST: 21 U/L (ref 15–41)
Albumin: 3.3 g/dL — ABNORMAL LOW (ref 3.5–5.0)
Alkaline Phosphatase: 83 U/L (ref 38–126)
Anion gap: 8 (ref 5–15)
BUN: 10 mg/dL (ref 6–20)
CO2: 23 mmol/L (ref 22–32)
Calcium: 13.6 mg/dL (ref 8.9–10.3)
Chloride: 106 mmol/L (ref 98–111)
Creatinine: 0.95 mg/dL (ref 0.61–1.24)
GFR, Estimated: >60 mL/min (ref >60)
Glucose, Bld: 111 mg/dL — ABNORMAL HIGH (ref 70–99)
Potassium: 3.7 mmol/L (ref 3.5–5.1)
Sodium: 137 mmol/L (ref 135–145)
Total Bilirubin: 0.7 mg/dL (ref 0.3–1.2)
Total Protein: 7.5 g/dL (ref 6.5–8.1)

## 2021-01-15 MED ORDER — FAMOTIDINE 20 MG IN NS 100 ML IVPB
20.0000 mg | Freq: Once | INTRAVENOUS | Status: AC
Start: 2021-01-15 — End: 2021-01-15
  Administered 2021-01-15: 20 mg via INTRAVENOUS

## 2021-01-15 MED ORDER — FAMOTIDINE 20 MG IN NS 100 ML IVPB
INTRAVENOUS | Status: AC
  Filled 2021-01-15: qty 100

## 2021-01-15 MED ORDER — ZOLEDRONIC ACID 4 MG/100ML IV SOLN
INTRAVENOUS | Status: AC
  Filled 2021-01-15: qty 100

## 2021-01-15 MED ORDER — SODIUM CHLORIDE 0.9 % IV SOLN
3.0300 mg/kg | Freq: Once | INTRAVENOUS | Status: AC
  Administered 2021-01-15: 280 mg via INTRAVENOUS
  Filled 2021-01-15: qty 24

## 2021-01-15 MED ORDER — SODIUM CHLORIDE 0.9 % IV SOLN
Freq: Once | INTRAVENOUS | Status: AC
  Filled 2021-01-15: qty 250

## 2021-01-15 MED ORDER — DIPHENHYDRAMINE HCL 50 MG/ML IJ SOLN
25.0000 mg | Freq: Once | INTRAMUSCULAR | Status: AC
  Administered 2021-01-15: 25 mg via INTRAVENOUS

## 2021-01-15 MED ORDER — SODIUM CHLORIDE 0.9 % IV SOLN
1.0000 mg/kg | Freq: Once | INTRAVENOUS | Status: AC
  Administered 2021-01-15: 100 mg via INTRAVENOUS
  Filled 2021-01-15: qty 20

## 2021-01-15 MED ORDER — DIPHENHYDRAMINE HCL 50 MG/ML IJ SOLN
INTRAMUSCULAR | Status: AC
  Filled 2021-01-15: qty 1

## 2021-01-15 MED ORDER — ZOLEDRONIC ACID 4 MG/100ML IV SOLN
4.0000 mg | Freq: Once | INTRAVENOUS | Status: AC
  Administered 2021-01-15: 4 mg via INTRAVENOUS

## 2021-01-15 MED ORDER — HYDROCODONE BIT-HOMATROP MBR 5-1.5 MG/5ML PO SOLN: 5.0000 mL | Freq: Four times a day (QID) | ORAL | 0 refills | Status: DC | PRN

## 2021-01-15 NOTE — Progress Notes (Signed)
CRITICAL VALUE STICKER  CRITICAL VALUE: Calcium 13.6  RECEIVER (on-site recipient of call): Velna Ochs RN  DATE & TIME NOTIFIED: 01/15/21 @ 0856  MESSENGER (representative from lab): Ulice Dash  MD NOTIFIED: [name]  TIME OF NOTIFICATION:0857  RESPONSE:  Zometa to be given today

## 2021-01-15 NOTE — Telephone Encounter (Signed)
Mr. Blatz has been scheduled to see Dr. Mickeal Skinner on 7/25 at 11:30am. Dylan Good has been notified

## 2021-01-15 NOTE — Progress Notes (Signed)
Hematology and Oncology Follow Up Visit  Dylan Good [id number] 1970/12/26 50 y.o. 01/15/2021 8:50 AM Patient, No Pcp Per (Inactive)No ref. provider found   Principle Diagnosis: 50 year old man with stage IV kidney cancer presented with a mass measuring 3.2 cm of the right kidney, bilateral pulmonary nodules and pleural effusion in June 2022.   Prior Therapy: He is status post thoracentesis completed in June 2022 and repeated on January 02, 2021.  Current therapy: Ipilimumab 1 mg/kg with nivolumab 3 mg/kg to start on January 15, 2021.  Interim History: Dylan Good returns today for a follow-up visit.  Since the last visit, he continues to have issues with dyspnea on exertion as well as recurrent cough.  He underwent a repeat thoracentesis on January 02, 2021 and 1.7 L was removed.  He reports feeling reasonably well at rest without any issues.  He does report some nausea occasional vomiting and poor p.o. intake at times.  He denies any headaches, blurry vision or any neurological deficits.  His performance status quality of life remains reasonable.    Medications: I have reviewed the patient's current medications.  Current Outpatient Medications  Medication Sig Dispense Refill   amoxicillin (AMOXIL) 500 MG capsule Take 2 capsules (1,000 mg total) by mouth 3 (three) times daily. 42 capsule 0   azithromycin (ZITHROMAX) 250 MG tablet Take 1 tablet (250 mg total) by mouth daily. Take first 2 tablets together, then 1 every day until finished. 6 tablet 0   naproxen (NAPROSYN) 500 MG tablet Take 1 tablet (500 mg total) by mouth 2 (two) times daily. 30 tablet 0   prochlorperazine (COMPAZINE) 10 MG tablet Take 1 tablet (10 mg total) by mouth every 6 (six) hours as needed for nausea or vomiting. 30 tablet 0   No current facility-administered medications for this visit.     Allergies: No Known Allergies    Physical Exam: Blood pressure (!) 163/88, pulse 96, temperature 98.6 F (37 C), temperature  source Oral, resp. rate 18, weight 199 lb 8 oz (90.5 kg), SpO2 96 %. ECOG: 1   General appearance: Comfortable appearing without any discomfort Head: Normocephalic without any trauma Oropharynx: Mucous membranes are moist and pink without any thrush or ulcers. Eyes: Pupils are equal and round reactive to light. Lymph nodes: No cervical, supraclavicular, inguinal or axillary lymphadenopathy.   Heart:regular rate and rhythm.  S1 and S2 without leg edema. Lung: Clear without any rhonchi or wheezes.  Decreased breath sounds on the left. Abdomin: Soft, nontender, nondistended with good bowel sounds.  No hepatosplenomegaly. Musculoskeletal: No joint deformity or effusion.  Full range of motion noted. Neurological: No deficits noted on motor, sensory and deep tendon reflex exam. Skin: No petechial rash or dryness.  Appeared moist.      Lab Results: Lab Results  Component Value Date   WBC 5.9 01/15/2021   HGB 12.2 (L) 01/15/2021   HCT 37.8 (L) 01/15/2021   MCV 75.8 (L) 01/15/2021   PLT 301 01/15/2021     Chemistry      Component Value Date/Time   NA 137 12/20/2020 1330   K 4.0 12/20/2020 1330   CL 106 12/20/2020 1330   CO2 25 12/20/2020 1330   BUN 11 12/20/2020 1330   CREATININE 0.92 12/20/2020 1330      Component Value Date/Time   CALCIUM 12.8 (H) 12/20/2020 1330   ALKPHOS 65 12/01/2020 1726   AST 17 12/01/2020 1726   ALT 18 12/01/2020 1726   BILITOT 0.9 12/01/2020 1726  Radiological Studies: IMPRESSION: 1. 5 mm enhancing left temporal lobe lesion with mild edema most compatible with a solitary metastasis. 2. Mild cerebral white matter T2 signal changes, nonspecific. Considerations include early chronic small vessel ischemia, migraines, prior infection/inflammation, and demyelinating disease   Impression and Plan:   50 year old man with:  1.  Stage IV kidney cancer presented with right kidney mass, bilateral pulmonary nodules and pleural effusion in June  2022.    A final pathological confirmation is pending but given the critical nature of his disease we opted to proceed with treatments and modify according to histology.  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  Risks and benefits of starting this therapy were discussed at this time.  Complications that include nausea, fatigue, dermatitis and autoimmune complications were reiterated.  He is agreeable to proceed.  He I will have a PET scan scheduled later this week and a biopsy we will schedule it soon after.  Despite the start of treatment biopsy is still mandatory at this time.   2.  Pleural effusion: Related to malignancy and underwent thoracentesis.  Repeat thoracentesis may be required until his disease is under control.  We will continue to monitor his symptoms.  Prescription for cough suppressant will be available to him.   3.  Hypercalcemia: He is minimally symptomatic But does report some anorexia and nausea which is likely contributed to by hypercalcemia.  Risks and benefits of Zometa were discussed at this time.  We will proceed with treatment today and repeated as needed.  This is hypercalcemia related to malignancy.  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  He has a very small isolated metastatic lesion and currently under evaluation for [facility] treatment.  This could be done concomitantly with systemic therapy at this time.  Preferably avoiding dexamethasone to optimize his immunotherapy.  5.  Antiemetics: Prescription for Compazine is available to him with instructions how to use it.  6.  Goals of care and prognosis: Aggressive measures are warranted given his young age and reasonable performance status.  His malignancy is unlikely to be curable.     7.  Follow-up: In 3 weeks for cycle 2  of ipilimumab and nivolumab.   40  minutes were spent on this encounter.  The time was dedicated to reviewing MRI results, treatment choices for his cancer, metastatic disease to the brain management, complications management related to his cancer including hypercalcemia and respiratory issues.   Zola Button, MD 7/11/20228:50 AM

## 2021-01-15 NOTE — Progress Notes (Signed)
Ipilimumab (YERVOY) Patient Monitoring Assessment   Is the patient experiencing any of the following general symptoms?:  [] Difficulty performing normal activities [] Feeling sluggish or cold all the time [] Unusual weight gain [] Constant or unusual headaches [] Feeling dizzy or faint [] Changes in eyesight (blurry vision, double vision, or other vision problems) [] Changes in mood or behavior (ex: decreased sex drive, irritability, or forgetfulness) [] Starting new medications (ex: steroids, other medications that lower immune response) [] Patient is not experiencing any of the general symptoms above.    Gastrointestinal  Patient is having 1 bowel movements each day.  Is this different from baseline? [] Yes [x] No Are your stools watery or do they have a foul smell? [] Yes [x] No Have you seen blood in your stools? [] Yes [x] No Are your stools dark, tarry, or sticky? [] Yes [x] No Are you having pain or tenderness in your belly? [] Yes [x] No  Skin Does your skin itch? [] Yes [x] No Do you have a rash? [] Yes [x] No Has your skin blistered and/or peeled? [] Yes [x] No Do you have sores in your mouth? [] Yes [x] No  Hepatic Has your urine been dark or tea colored? [x] Yes [] No Have you noticed that your skin or the whites of your eyes are turning yellow? [] Yes [x] No Are you bleeding or bruising more easily than normal? [] Yes [x] No Are you nauseous and/or vomiting? [] Yes [x] No Do you have pain on the right side of your stomach? [] Yes [x] No  Neurologic  Are you having unusual weakness of legs, arms, or face? [] Yes [x] No Are you having numbness or tingling in your hands or feet? [] Yes [x] No  Jaymie Mckiddy E Kenna Seward   Per [name] OK to proceed

## 2021-01-15 NOTE — Progress Notes (Deleted)
.  yervoy

## 2021-01-15 NOTE — Progress Notes (Deleted)
yer

## 2021-01-15 NOTE — Patient Instructions (Signed)
[facility]  Discharge Instructions: Thank you for choosing [facility] to provide your oncology and hematology care.   If you have a lab appointment with the [facility], please go directly to the [facility] and check in at the registration area.   Wear comfortable clothing and clothing appropriate for easy access to any Portacath or PICC line.   We strive to give you quality time with your provider. You may need to reschedule your appointment if you arrive late (15 or more minutes).  Arriving late affects you and other patients whose appointments are after yours.  Also, if you miss three or more appointments without notifying the office, you may be dismissed from the clinic at the provider's discretion.      For prescription refill requests, have your pharmacy contact our office and allow 72 hours for refills to be completed.    Today you received the following chemotherapy and/or immunotherapy agents: Nivolumab; Yervoy      To help prevent nausea and vomiting after your treatment, we encourage you to take your nausea medication as directed.  BELOW ARE SYMPTOMS THAT SHOULD BE REPORTED IMMEDIATELY: *FEVER GREATER THAN 100.4 F (38 C) OR HIGHER *CHILLS OR SWEATING *NAUSEA AND VOMITING THAT IS NOT CONTROLLED WITH YOUR NAUSEA MEDICATION *UNUSUAL SHORTNESS OF BREATH *UNUSUAL BRUISING OR BLEEDING *URINARY PROBLEMS (pain or burning when urinating, or frequent urination) *BOWEL PROBLEMS (unusual diarrhea, constipation, pain near the anus) TENDERNESS IN MOUTH AND THROAT WITH OR WITHOUT PRESENCE OF ULCERS (sore throat, sores in mouth, or a toothache) UNUSUAL RASH, SWELLING OR PAIN  UNUSUAL VAGINAL DISCHARGE OR ITCHING   Items with * indicate a potential emergency and should be followed up as soon as possible or go to the Emergency Department if any problems should occur.  Please show the CHEMOTHERAPY ALERT CARD or IMMUNOTHERAPY ALERT CARD at  check-in to the Emergency Department and triage nurse.  Should you have questions after your visit or need to cancel or reschedule your appointment, please contact [facility]  Dept: [phone]  and follow the prompts.  Office hours are 8:00 a.m. to 4:30 p.m. Monday - Friday. Please note that voicemails left after 4:00 p.m. may not be returned until the following business day.  We are closed weekends and major holidays. You have access to a nurse at all times for urgent questions. Please call the main number to the clinic Dept: [phone] and follow the prompts.   For any non-urgent questions, you may also contact your provider using MyChart. We now offer e-Visits for anyone 31 and older to request care online for non-urgent symptoms. For details visit [email or web address].   Also download the MyChart app! Go to the app store, search "MyChart", open the app, select [facility], and log in with your MyChart username and password.  Due to Covid, a mask is required upon entering the hospital/clinic. If you do not have a mask, one will be given to you upon arrival. For doctor visits, patients may have 1 support person aged 53 or older with them. For treatment visits, patients cannot have anyone with them due to current Covid guidelines and our immunocompromised population.   Nivolumab injection What is this medication? NIVOLUMAB (nye VOL ue mab) is a monoclonal antibody. It treats certain types of cancer. Some of the cancers treated are colon cancer, head and neck cancer,Hodgkin lymphoma, lung cancer, and melanoma. This medicine may be used for other purposes; ask your  health care provider orpharmacist if you have questions. COMMON BRAND NAME(S): Opdivo What should I tell my care team before I take this medication? They need to know if you have any of these conditions: autoimmune diseases like Crohn's disease, ulcerative colitis, or lupus have had or planning to  have an allogeneic stem cell transplant (uses someone else's stem cells) history of chest radiation history of organ transplant nervous system problems like myasthenia gravis or Guillain-Barre syndrome an unusual or allergic reaction to nivolumab, other medicines, foods, dyes, or preservatives pregnant or trying to get pregnant breast-feeding How should I use this medication? This medicine is for infusion into a vein. It is given by a health careprofessional in a hospital or clinic setting. A special MedGuide will be given to you before each treatment. Be sure to readthis information carefully each time. Talk to your pediatrician regarding the use of this medicine in children. While this drug may be prescribed for children as young as 12 years for selectedconditions, precautions do apply. Overdosage: If you think you have taken too much of this medicine contact apoison control center or emergency room at once. NOTE: This medicine is only for you. Do not share this medicine with others. What if I miss a dose? It is important not to miss your dose. Call your doctor or health careprofessional if you are unable to keep an appointment. What may interact with this medication? Interactions have not been studied. This list may not describe all possible interactions. Give your health care provider a list of all the medicines, herbs, non-prescription drugs, or dietary supplements you use. Also tell them if you smoke, drink alcohol, or use illegaldrugs. Some items may interact with your medicine. What should I watch for while using this medication? This drug may make you feel generally unwell. Continue your course of treatmenteven though you feel ill unless your doctor tells you to stop. You may need blood work done while you are taking this medicine. Do not become pregnant while taking this medicine or for 5 months after stopping it. Women should inform their doctor if they wish to become pregnant or  think they might be pregnant. There is a potential for serious side effects to an unborn child. Talk to your health care professional or pharmacist for more information. Do not breast-feed an infant while taking this medicine orfor 5 months after stopping it. What side effects may I notice from receiving this medication? Side effects that you should report to your doctor or health care professionalas soon as possible: allergic reactions like skin rash, itching or hives, swelling of the face, lips, or tongue breathing problems blood in the urine bloody or watery diarrhea or black, tarry stools changes in emotions or moods changes in vision chest pain cough dizziness feeling faint or lightheaded, falls fever, chills headache with fever, neck stiffness, confusion, loss of memory, sensitivity to light, hallucination, loss of contact with reality, or seizures joint pain mouth sores redness, blistering, peeling or loosening of the skin, including inside the mouth severe muscle pain or weakness signs and symptoms of high blood sugar such as dizziness; dry mouth; dry skin; fruity breath; nausea; stomach pain; increased hunger or thirst; increased urination signs and symptoms of kidney injury like trouble passing urine or change in the amount of urine signs and symptoms of liver injury like dark yellow or brown urine; general ill feeling or flu-like symptoms; light-colored stools; loss of appetite; nausea; right upper belly pain; unusually weak or tired; yellowing of  the eyes or skin swelling of the ankles, feet, hands trouble passing urine or change in the amount of urine unusually weak or tired weight gain or loss Side effects that usually do not require medical attention (report to yourdoctor or health care professional if they continue or are bothersome): bone pain constipation decreased appetite diarrhea muscle pain nausea, vomiting tiredness This list may not describe all possible side  effects. Call your doctor for medical advice about side effects. You may report side effects to FDA at1-800-FDA-1088. Where should I keep my medication? This drug is given in a hospital or clinic and will not be stored at home. NOTE: This sheet is a summary. It may not cover all possible information. If you have questions about this medicine, talk to your doctor, pharmacist, orhealth care provider.  2022 Elsevier/Gold Standard (2019-10-27 10:08:25)  Ipilimumab injection What is this medication? IPILIMUMAB (IP i LIM ue mab) is a monoclonal antibody. It is used to treat colorectal cancer, kidney cancer, liver cancer, lung cancer, melanoma, andmesothelioma. This medicine may be used for other purposes; ask your health care provider orpharmacist if you have questions. COMMON BRAND NAME(S): YERVOY What should I tell my care team before I take this medication? They need to know if you have any of these conditions: autoimmune diseases like Crohn's disease, ulcerative colitis, or lupus have had or planning to have an allogeneic stem cell transplant (uses someone else's stem cells) history of organ transplant nervous system problems like myasthenia gravis or Guillain-Barre syndrome an unusual or allergic reaction to ipilimumab, other medicines, foods, dyes, or preservatives pregnant or trying to get pregnant breast-feeding How should I use this medication? This medicine is for infusion into a vein. It is given by a health careprofessional in a hospital or clinic setting. A special MedGuide will be given to you before each treatment. Be sure to readthis information carefully each time. Talk to your pediatrician regarding the use of this medicine in children. While this drug may be prescribed for children as young as 12 years for selectedconditions, precautions do apply. Overdosage: If you think you have taken too much of this medicine contact apoison control center or emergency room at once. NOTE:  This medicine is only for you. Do not share this medicine with others. What if I miss a dose? It is important not to miss your dose. Call your doctor or health careprofessional if you are unable to keep an appointment. What may interact with this medication? Interactions are not expected. This list may not describe all possible interactions. Give your health care provider a list of all the medicines, herbs, non-prescription drugs, or dietary supplements you use. Also tell them if you smoke, drink alcohol, or use illegaldrugs. Some items may interact with your medicine. What should I watch for while using this medication? Tell your doctor or healthcare professional if your symptoms do not start toget better or if they get worse. Do not become pregnant while taking this medicine or for 3 months after stopping it. Women should inform their doctor if they wish to become pregnant or think they might be pregnant. There is a potential for serious side effects to an unborn child. Talk to your health care professional or pharmacist for more information. Do not breast-feed an infant while taking this medicine orfor 3 months after the last dose. Your condition will be monitored carefully while you are receiving thismedicine. You may need blood work done while you are taking this medicine. What side effects may  I notice from receiving this medication? Side effects that you should report to your doctor or health care professionalas soon as possible: allergic reactions like skin rash, itching or hives, swelling of the face, lips, or tongue black, tarry stools bloody or watery diarrhea changes in vision dizziness eye pain fast, irregular heartbeat feeling anxious feeling faint or lightheaded, falls nausea, vomiting pain, tingling, numbness in the hands or feet redness, blistering, peeling or loosening of the skin, including inside the mouth signs and symptoms of liver injury like dark yellow or brown  urine; general ill feeling or flu-like symptoms; light-colored stools; loss of appetite; nausea; right upper belly pain; unusually weak or tired; yellowing of the eyes or skin unusual bleeding or bruising Side effects that usually do not require medical attention (report to yourdoctor or health care professional if they continue or are bothersome): headache loss of appetite trouble sleeping This list may not describe all possible side effects. Call your doctor for medical advice about side effects. You may report side effects to FDA at1-800-FDA-1088. Where should I keep my medication? This drug is given in a hospital or clinic and will not be stored at home. NOTE: This sheet is a summary. It may not cover all possible information. If you have questions about this medicine, talk to your doctor, pharmacist, orhealth care provider.  2022 Elsevier/Gold Standard (2019-05-26 18:53:00)  Zoledronic Acid Injection (Hypercalcemia, Oncology) What is this medication? ZOLEDRONIC ACID (ZOE le dron ik AS id) slows calcium loss from bones. It high calcium levels in the blood from some kinds of cancer. It may be used in otherpeople at risk for bone loss. This medicine may be used for other purposes; ask your health care provider orpharmacist if you have questions. COMMON BRAND NAME(S): Zometa What should I tell my care team before I take this medication? They need to know if you have any of these conditions: cancer dehydration dental disease kidney disease liver disease low levels of calcium in the blood lung or breathing disease (asthma) receiving steroids like dexamethasone or prednisone an unusual or allergic reaction to zoledronic acid, other medicines, foods, dyes, or preservatives pregnant or trying to get pregnant breast-feeding How should I use this medication? This drug is injected into a vein. It is given by a health care provider in [facility] or clinic setting. Talk to your health care  provider about the use of this drug in children.Special care may be needed. Overdosage: If you think you have taken too much of this medicine contact apoison control center or emergency room at once. NOTE: This medicine is only for you. Do not share this medicine with others. What if I miss a dose? Keep appointments for follow-up doses. It is important not to miss your dose.Call your health care provider if you are unable to keep an appointment. What may interact with this medication? certain antibiotics given by injection NSAIDs, medicines for pain and inflammation, like ibuprofen or naproxen some diuretics like bumetanide, furosemide teriparatide thalidomide This list may not describe all possible interactions. Give your health care provider a list of all the medicines, herbs, non-prescription drugs, or dietary supplements you use. Also tell them if you smoke, drink alcohol, or use illegaldrugs. Some items may interact with your medicine. What should I watch for while using this medication? Visit your health care provider for regular checks on your progress. It may besome time before you see the benefit from this drug. Some people who take this drug have severe bone, joint, or  muscle pain. This drug may also increase your risk for jaw problems or a broken thigh bone. Tell your health care provider right away if you have severe pain in your jaw, bones, joints, or muscles. Tell you health care provider if you have any painthat does not go away or that gets worse. Tell your dentist and dental surgeon that you are taking this drug. You should not have major dental surgery while on this drug. See your dentist to have a dental exam and fix any dental problems before starting this drug. Take good care of your teeth while on this drug. Make sure you see your dentist forregular follow-up appointments. You should make sure you get enough calcium and vitamin D while you are taking this drug. Discuss the foods  you eat and the vitamins you take with your healthcare provider. Check with your health care provider if you have severe diarrhea, nausea, and vomiting, or if you sweat a lot. The loss of too much body fluid may make itdangerous for you to take this drug. You may need blood work done while you are taking this drug. Do not become pregnant while taking this drug. Women should inform their health care provider if they wish to become pregnant or think they might be pregnant. There is potential for serious harm to an unborn child. Talk to your healthcare provider for more information. What side effects may I notice from receiving this medication? Side effects that you should report to your doctor or health care provider assoon as possible: allergic reactions (skin rash, itching or hives; swelling of the face, lips, or tongue) bone pain infection (fever, chills, cough, sore throat, pain or trouble passing urine) jaw pain, especially after dental work joint pain kidney injury (trouble passing urine or change in the amount of urine) low blood pressure (dizziness; feeling faint or lightheaded, falls; unusually weak or tired) low calcium levels (fast heartbeat; muscle cramps or pain; pain, tingling, or numbness in the hands or feet; seizures) low magnesium levels (fast, irregular heartbeat; muscle cramp or pain; muscle weakness; tremors; seizures) low red blood cell counts (trouble breathing; feeling faint; lightheaded, falls; unusually weak or tired) muscle pain redness, blistering, peeling, or loosening of the skin, including inside the mouth severe diarrhea swelling of the ankles, feet, hands trouble breathing Side effects that usually do not require medical attention (report to yourdoctor or health care provider if they continue or are bothersome): anxious constipation coughing depressed mood eye irritation, itching, or pain fever general ill feeling or flu-like symptoms nausea pain, redness,  or irritation at site where injected trouble sleeping This list may not describe all possible side effects. Call your doctor for medical advice about side effects. You may report side effects to FDA at1-800-FDA-1088. Where should I keep my medication? This drug is given in a hospital or clinic. It will not be stored at home. NOTE: This sheet is a summary. It may not cover all possible information. If you have questions about this medicine, talk to your doctor, pharmacist, orhealth care provider.  2022 Elsevier/Gold Standard (2019-04-08 09:13:00)

## 2021-01-16 ENCOUNTER — Other Ambulatory Visit: Payer: Self-pay | Admitting: Radiation Therapy

## 2021-01-16 ENCOUNTER — Telehealth: Payer: Self-pay | Admitting: *Deleted

## 2021-01-16 NOTE — Telephone Encounter (Signed)
-----   Message from Priscille Loveless, RN sent at 01/15/2021  1:49 PM EDT ----- Regarding: First Chemo Shadad First Yervoy/opdivo. Tolerated well

## 2021-01-16 NOTE — Telephone Encounter (Signed)
Called pt to see how he did with his treatment yest.  She states he has done fine but just a little groggy this am.  He denies any other problems & states he knows how to reach [place] if needed.

## 2021-01-17 ENCOUNTER — Telehealth: Payer: Self-pay

## 2021-01-17 ENCOUNTER — Encounter (HOSPITAL_COMMUNITY)
Admission: RE | Admit: 2021-01-17 | Discharge: 2021-01-17 | Disposition: A | Discharge status: Home / Self Care | Payer: PRIVATE HEALTH INSURANCE | Source: Ambulatory Visit | Attending: Oncology | Admitting: Oncology

## 2021-01-17 ENCOUNTER — Other Ambulatory Visit: Payer: Self-pay

## 2021-01-17 DIAGNOSIS — R918 Other nonspecific abnormal finding of lung field: Secondary | ICD-10-CM | POA: Insufficient documentation

## 2021-01-17 LAB — GLUCOSE, CAPILLARY: Glucose-Capillary: 112 mg/dL — ABNORMAL HIGH (ref 70–99)

## 2021-01-17 IMAGING — CT NM PET TUM IMG INITIAL (PI) SKULL BASE T - THIGH
7 series · 25 of 25 positions shown · non-contrast
Comparison: Chest CT [DATE]

CLINICAL DATA: Initial treatment strategy for urologic carcinoma.

EXAM:
NUCLEAR MEDICINE PET SKULL BASE TO THIGH
TECHNIQUE: 10.4 mCi F-18 FDG was injected intravenously. Full-ring PET imaging
was performed from the skull base to thigh after the radiotracer. CT
data was obtained and used for attenuation correction and anatomic
localization.
Fasting blood glucose: 112 mg/dl

[Series 3: pet sk_thigh ac · axial · 5.0mm · 4.07mm/px · z∈[-1161,-185]mm · 5 of 245 slices shown]
[im 1/245]
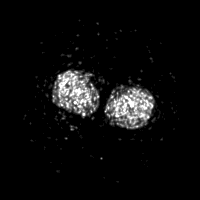
[im 62/245]
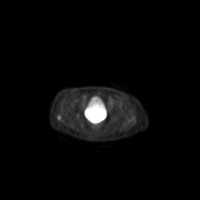
[im 123/245]
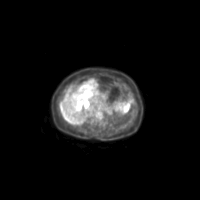
[im 184/245]
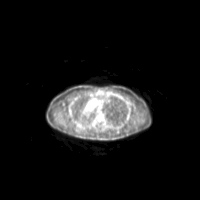
[im 245/245]
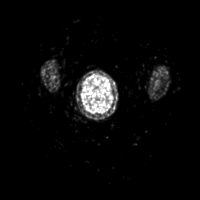

[Series 4: ct sk_thigh 5.0 bf37 · axial · 5.0mm · 0.98mm/px · z∈[-1161,-185]mm · 5 of 245 slices shown]
[im 1/245]
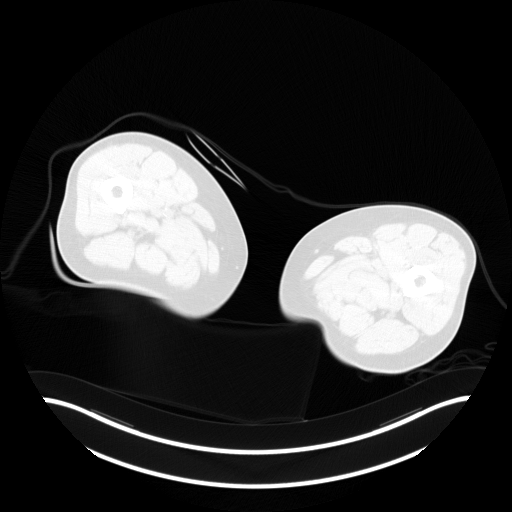
[im 62/245]
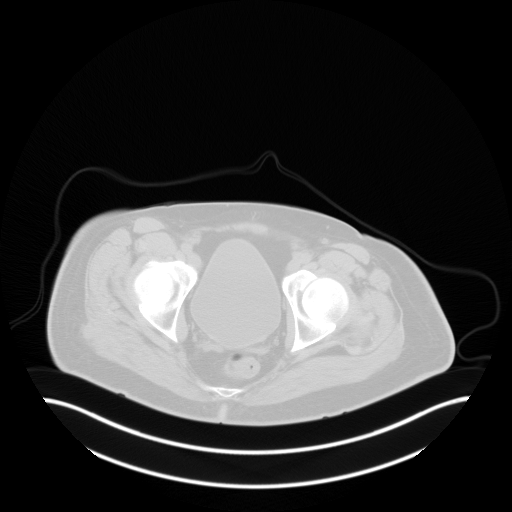
[im 123/245]
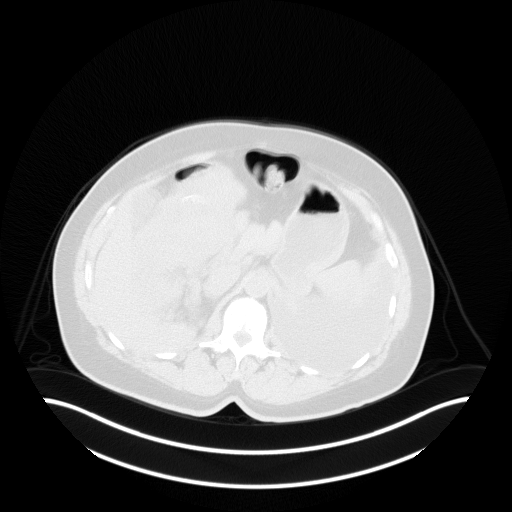
[im 184/245]
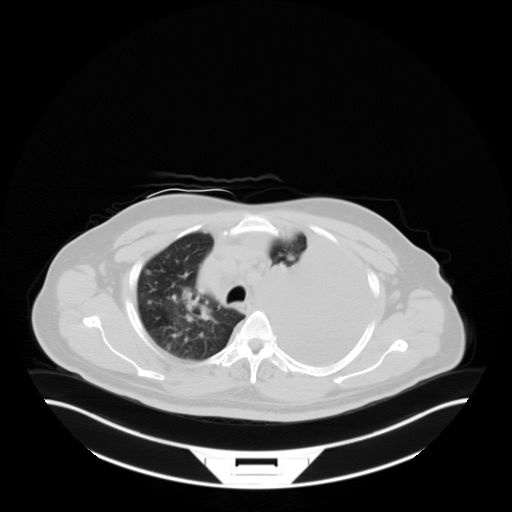
[im 245/245  brain]
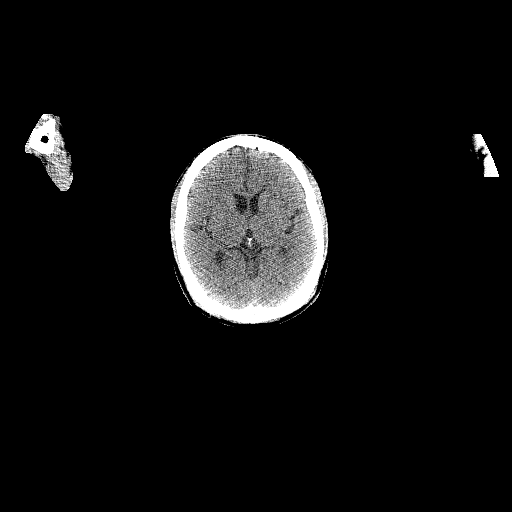

[Series 5: pet sk_thigh nac · axial · 5.0mm · 4.07mm/px · z∈[-1161,-185]mm · 6 of 245 slices shown]
[im 1/245]
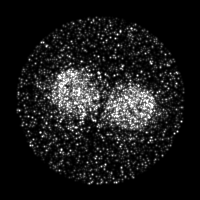
[im 49/245]
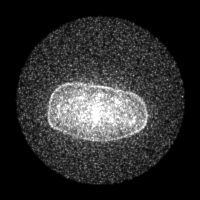
[im 98/245]
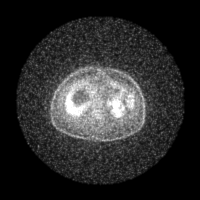
[im 147/245]
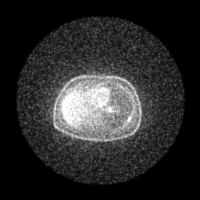
[im 196/245]
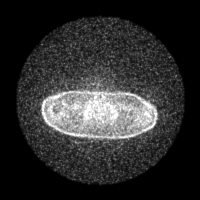
[im 245/245]
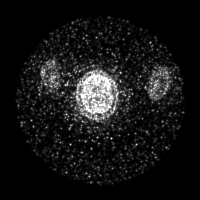

[Series 8: ct sk_thigh 5.0 br59 (id)_bone · axial · 5.0mm · 0.71mm/px · z∈[-631,-359]mm · 2 of 69 slices shown]
[im 1/69]
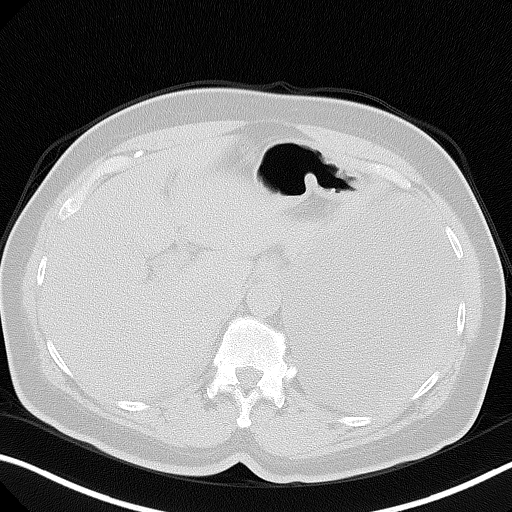
[im 69/69]
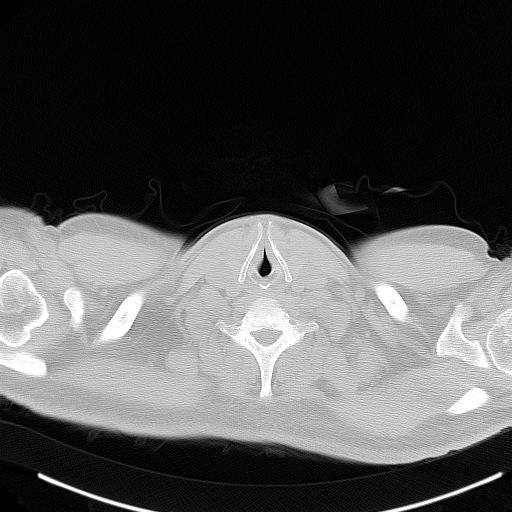

[Series 603: <mip collection> · coronal · 2.02mm/px · 1 of 32 slices shown]
[im 1/32]
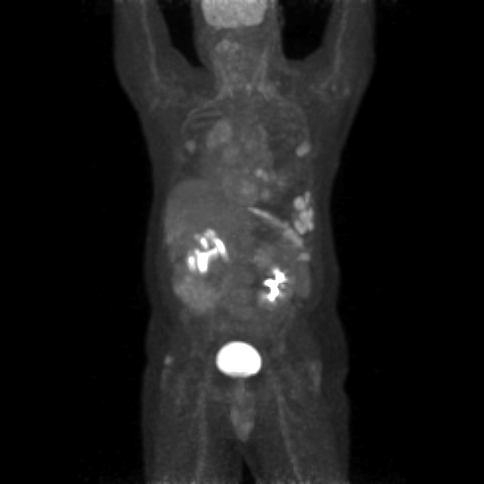

[Series 604: fused cor · 1 of 35 slices shown]
[im 1/35]
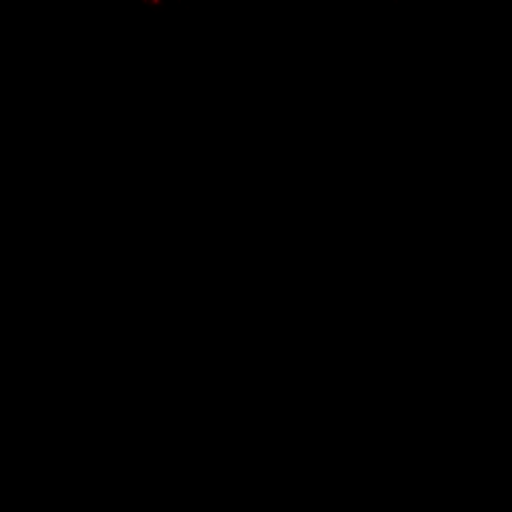

[Series 605: range-ct sk_thigh 5.0 bf37-tra-<alpha range> · 5 of 234 slices shown]
[im 1/234]
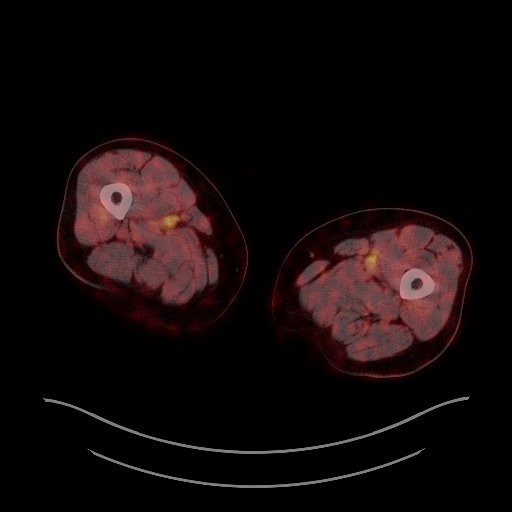
[im 59/234]
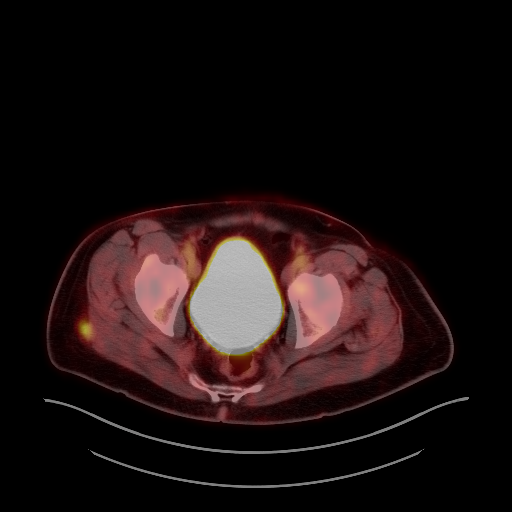
[im 117/234]
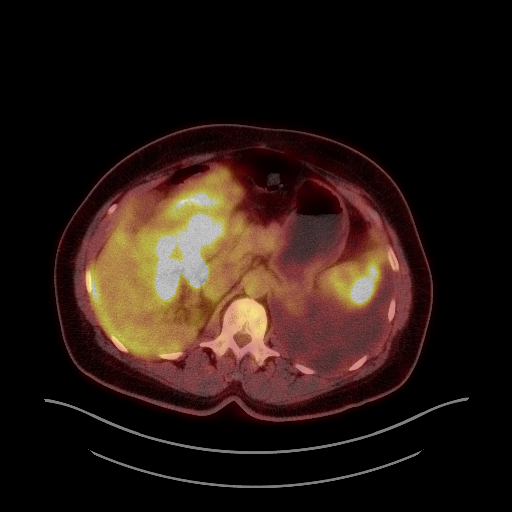
[im 175/234]
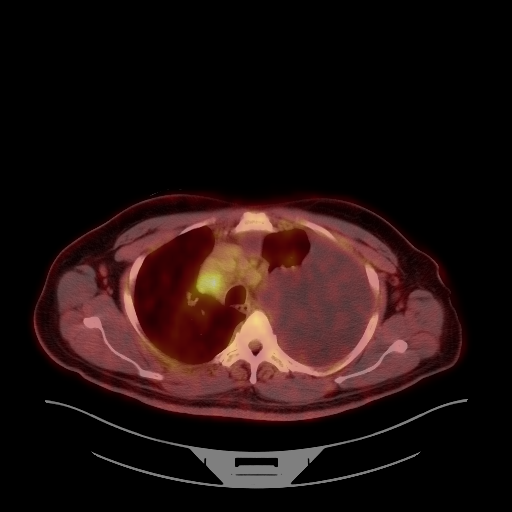
[im 234/234]
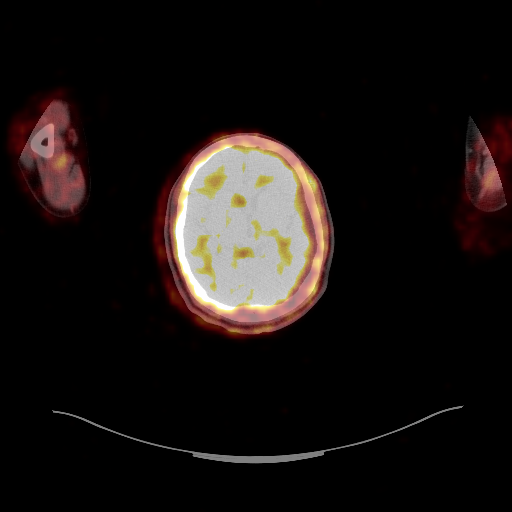

[25 of 25 positions shown; findings below may reference images not displayed]

FINDINGS: Mediastinal blood pool activity: SUV max

Liver activity: SUV max NA

NECK: No hypermetabolic lymph nodes in the neck.

Incidental CT findings: none

CHEST: There is a large LEFT pleural effusion occupying the near
entirety of the LEFT hemithorax. Only a small volume of the LEFT
upper lobe is aerated. The LEFT lower lobe and large portion of the
LEFT upper lobe are collapsed centrally around the hilum.

Within this collapsed lower lobe, hypermetabolic focus with SUV max
equal 8.9 (image 89).

Along the lateral pleural surface of the LEFT lower hemithorax there
is nodular soft tissue with intense metabolic activity measuring 8
cm x 2 cm with SUV max 11.9. A similar finding in the lateral mid
LEFT hemithorax with SUV max equal 6.3. This nodular pleural
thickening erodes the adjacent fifth rib (image 76)

There is a large hypermetabolic RIGHT lower paratracheal node which
is bulky measuring 2.6 cm short axis. Within the aerated RIGHT lung
there is a several hypermetabolic nodules. Large peripheral nodule
measures 2.6 cm on image 78 with SUV max

Incidental CT findings: none

ABDOMEN/PELVIS: Large aggressive infiltrative mass extends from the
RIGHT kidney ventrally to the ventral peritoneal surface. Mass
measures 13.9 x 11.7 by 16.5 cm and has intense peripheral metabolic
activity with SUV max equal 18.6. Centrally the mass is necrotic and
photopenic.

There is an oblong mass between the LEFT kidney and pancreas
measuring 5.8 by 2.7 cm with moderate heterogeneous metabolic
activity. Image 56.

Incidental CT findings: LEFT kidney appears normal. Adrenal glands
are difficult to identify noncontrast exam.

SKELETON: Direct invasion the LEFT lateral fifth rib is described in
the chest section. No additional evidence skeletal metastasis.

Hyper hypermetabolic subcutaneous nodule along the surface of the
RIGHT gluteus maximus muscle with SUV max equal 5.1.

Incidental CT findings: none
IMPRESSION: 1. Large aggressive RIGHT renal mass with intense peripheral
metabolic activity consistent with primary renal carcinoma. Mass
extends to the porta hepatis and ventral peritoneal surface.
2. Extensive thoracic metastasis with LEFT lung lobe metastasis as
well as nodular pleural metastasis in LEFT pleural space. Pleural
metastasis directly invade the LEFT fifth rib.
3. Very large LEFT pleural effusion with passive atelectasis of the
LEFT lung.
4. Nodular metastasis to the RIGHT lung.
5. Bulky mediastinal paratracheal metastatic adenopathy.
6. Oblong mass in the LEFT suprarenal space of unclear etiology but
favored malignant.
7. Small subcutaneous nodule metastasis superficial to the RIGHT
gluteus muscle.

## 2021-01-17 MED ORDER — FLUDEOXYGLUCOSE F - 18 (FDG) INJECTION
10.4000 | Freq: Once | INTRAVENOUS | Status: AC
  Administered 2021-01-17: 10.4 via INTRAVENOUS

## 2021-01-17 MED ORDER — FLUDEOXYGLUCOSE F - 18 (FDG) INJECTION
10.0000 | Freq: Once | INTRAVENOUS | Status: DC | PRN
Start: 2021-01-17 — End: 2021-01-23

## 2021-01-17 NOTE — Telephone Encounter (Signed)
Left VM on patient's mobile with upcoming appointment dates/times for scans and consult with Dr. Isidore Moos. Provided direct call back number, and requested call back to confirm patient is aware of appointments.   Called and spoke with patient's significant other [name], and relayed upcoming appointment dates/times. She confirmed she had date/times/locations written down, and would ensure patient was aware. Provided my direct call back number should she or patient have any other questions/concerns before his consultation with Dr. Isidore Moos next Tuesday 01/23/21

## 2021-01-18 NOTE — Progress Notes (Signed)
Location/Histology of Brain Tumor: Radiological Studies: IMPRESSION: 1. 5 mm enhancing left temporal lobe lesion with mild edema most compatible with a solitary metastasis. 2. Mild cerebral white matter T2 signal changes, nonspecific. Considerations include early chronic small vessel ischemia, migraines, prior infection/inflammation, and demyelinating disease  MRI Brain w/ & w/o Contrast  01/20/2020 --IMPRESSION: Two cerebral metastases measuring 5 mm in the left temporal lobe and 2 mm in the right temporal lobe.  Patient presented with symptoms of:  none positive MRI  Past or anticipated interventions, if any, per neurosurgery: no  Past or anticipated interventions, if any, per medical oncology: no  Dose of Decadron, if applicable: no  Recent neurologic symptoms, if any:  Seizures: no Headaches: no Nausea: no Dizziness/ataxia: no Difficulty with hand coordination: no Focal numbness/weakness: no Visual deficits/changes: no Confusion/Memory deficits: no  Painful bone metastases at present, if any: no  SAFETY ISSUES: Prior radiation? no Pacemaker/ICD? no Possible current pregnancy? no Is the patient on methotrexate? no  Additional Complaints / other details: Patient and family member very anxious state they are unaware of his condition. Needs to be seen by social worker and possibly psycotherapist tho deal with the issue.

## 2021-01-19 ENCOUNTER — Telehealth: Payer: Self-pay

## 2021-01-19 ENCOUNTER — Encounter: Payer: Self-pay | Admitting: *Deleted

## 2021-01-19 ENCOUNTER — Encounter: Payer: Self-pay | Admitting: Oncology

## 2021-01-19 ENCOUNTER — Ambulatory Visit
Admission: RE | Admit: 2021-01-19 | Discharge: 2021-01-19 | Disposition: A | Discharge status: Home / Self Care | Payer: No Typology Code available for payment source | Source: Ambulatory Visit | Attending: Radiation Oncology | Admitting: Radiation Oncology

## 2021-01-19 ENCOUNTER — Other Ambulatory Visit: Payer: Self-pay

## 2021-01-19 DIAGNOSIS — C7931 Secondary malignant neoplasm of brain: Secondary | ICD-10-CM

## 2021-01-19 IMAGING — MR MR HEAD WO/W CM
12 series · 48 of 48 positions shown · IV contrast (multihance)
Comparison: None.

CLINICAL DATA: Staging brain metastases.  Kidney cancer.

EXAM:
MRI HEAD WITHOUT AND WITH CONTRAST
TECHNIQUE: Multiplanar, multiecho pulse sequences of the brain and surrounding
structures were obtained without and with intravenous contrast.
CONTRAST:  20mL MULTIHANCE GADOBENATE DIMEGLUMINE 529 MG/ML IV SOLN

[Series 2: FLAIR · sagittal · 3.0mm · 0.75mm/px · 3 of 39 slices shown (1 of 2)]
[im 1/39]
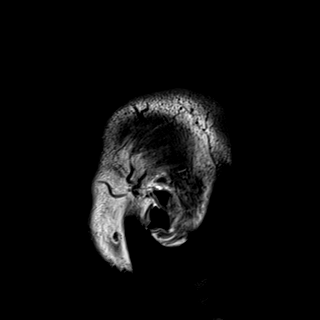
[im 20/39]
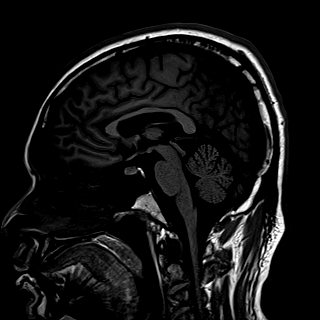
[im 39/39]
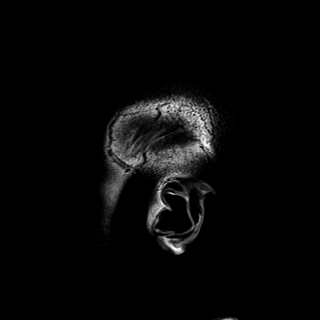

[Series 3: DWI · axial · 3.0mm · 1.50mm/px · z∈[-49,+99]mm · 4 of 78 slices shown (1 of 2)]
[im 1/78]
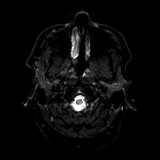
[im 26/78]
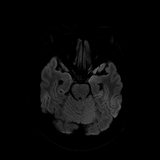
[im 52/78]
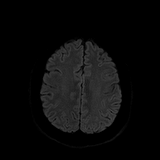
[im 78/78]
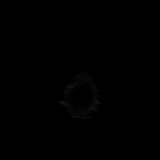

[Series 4: DWI · axial · 3.0mm · 1.50mm/px · z∈[-49,+99]mm · 2 of 38 slices shown (2 of 2)]
[im 1/38]
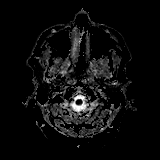
[im 38/38]
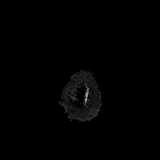

[Series 5: swi_images · axial · 1.5mm · 0.90mm/px · z∈[-48,+95]mm · 5 of 96 slices shown]
[im 1/96]
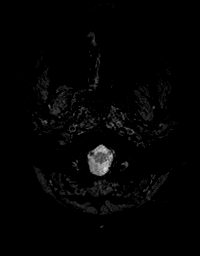
[im 24/96]
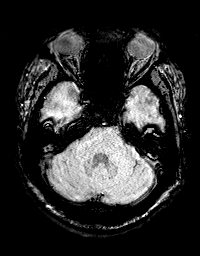
[im 48/96]
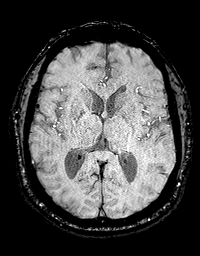
[im 72/96]
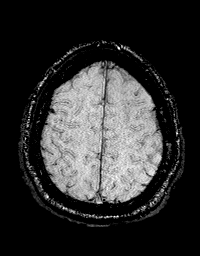
[im 96/96]
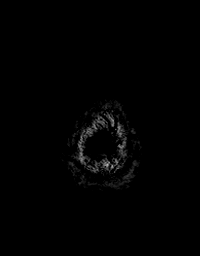

[Series 7: T2 · axial · 5.0mm · 0.57mm/px · 1 of 29 slices shown (1 of 2)]
[im 1/29]
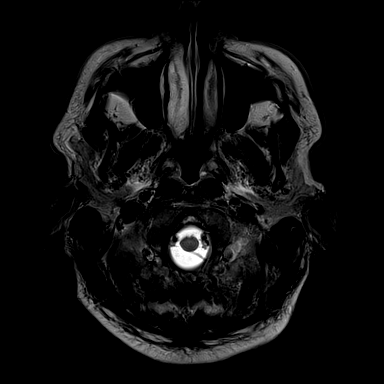

[Series 8: FLAIR · axial · 3.0mm · 0.86mm/px · z∈[-74,+106]mm · 3 of 61 slices shown (2 of 2)]
[im 1/61]
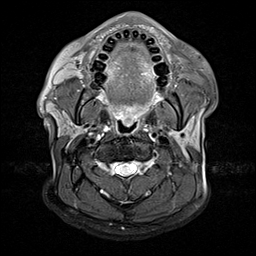
[im 31/61]
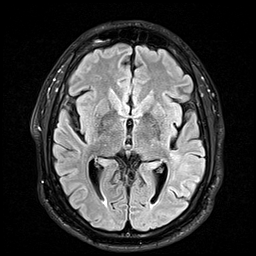
[im 61/61]
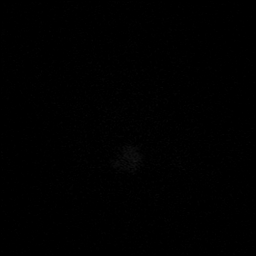

[Series 9: T2 · axial · non-contrast · 1.0mm · 0.86mm/px · z∈[-40,+116]mm · 8 of 160 slices shown (2 of 2)]
[im 1/160]
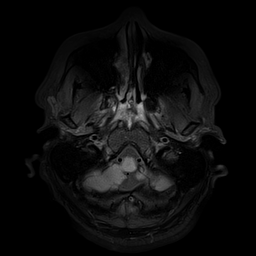
[im 23/160]
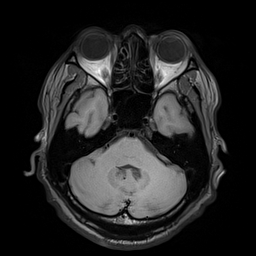
[im 46/160]
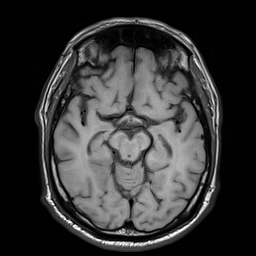
[im 69/160]
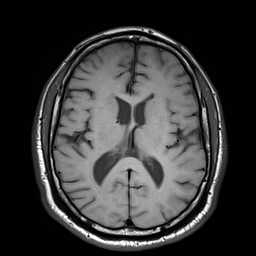
[im 91/160]
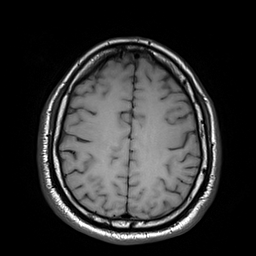
[im 114/160]
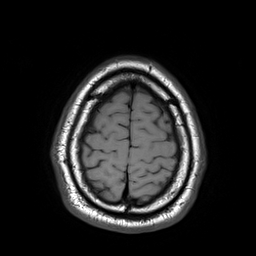
[im 137/160]
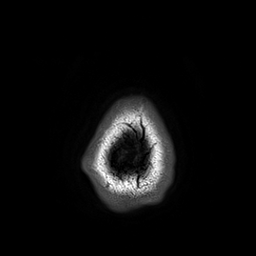
[im 160/160]
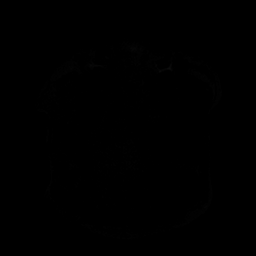

[Series 10: T2 post-contrast · coronal · 3.0mm · 0.57mm/px · 2 of 47 slices shown (1 of 2)]
[im 1/47]
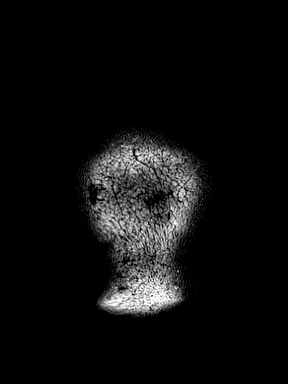
[im 47/47]
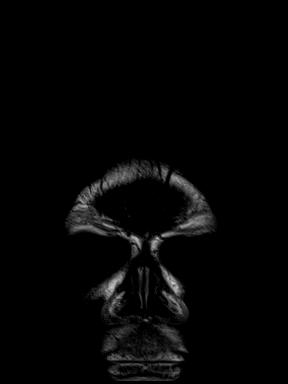

[Series 11: T2 post-contrast · axial · 1.0mm · 0.86mm/px · z∈[-40,+116]mm · 8 of 160 slices shown (2 of 2)]
[im 1/160]
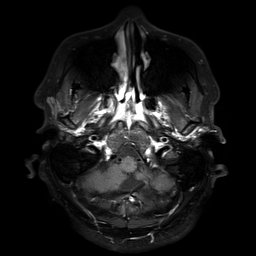
[im 23/160]
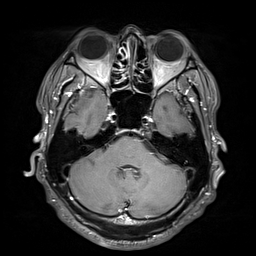
[im 46/160]
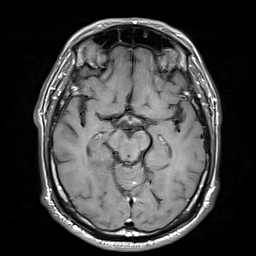
[im 69/160]
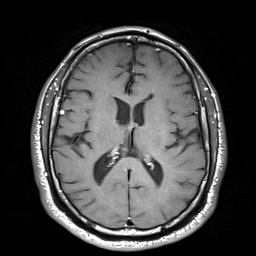
[im 91/160]
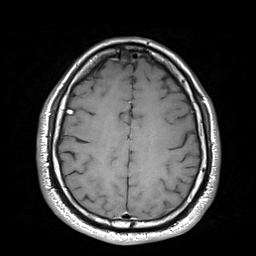
[im 114/160]
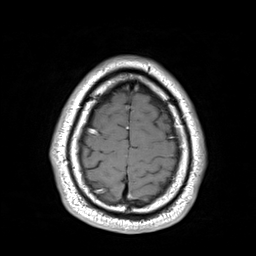
[im 137/160]
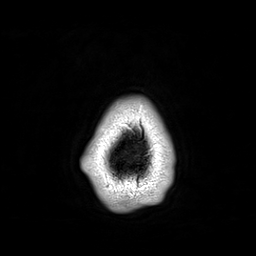
[im 160/160]
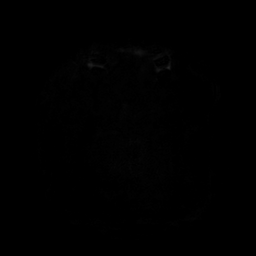

[Series 12: T1 post-contrast · axial · 1.0mm · 0.75mm/px · z∈[-41,+118]mm · 8 of 157 slices shown (1 of 2)]
[im 1/157]
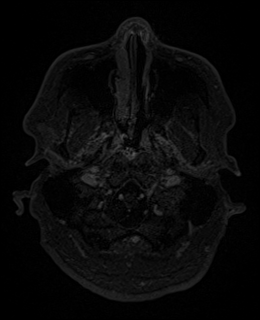
[im 23/157]
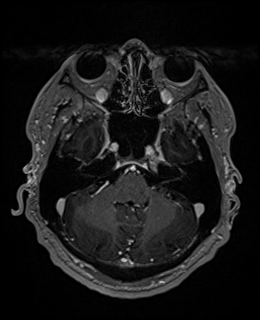
[im 45/157]
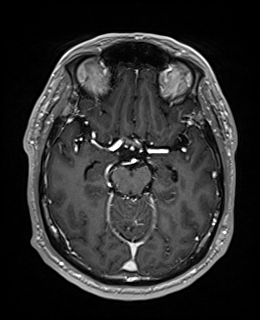
[im 67/157]
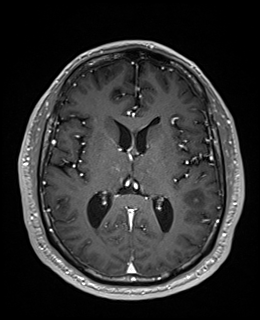
[im 90/157]
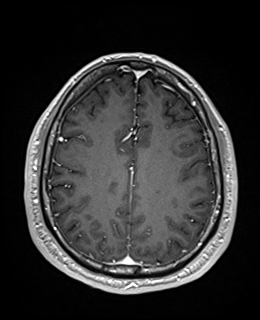
[im 112/157]
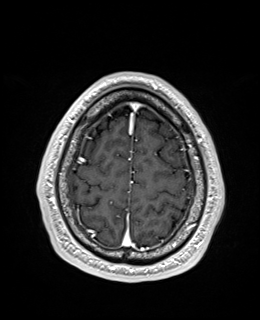
[im 134/157]
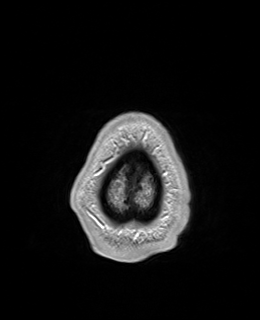
[im 157/157]
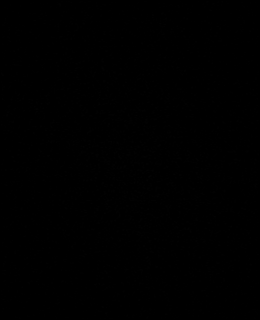

[Series 13: T1 post-contrast · coronal · 3.0mm · 0.57mm/px · 2 of 47 slices shown (2 of 2)]
[im 1/47]
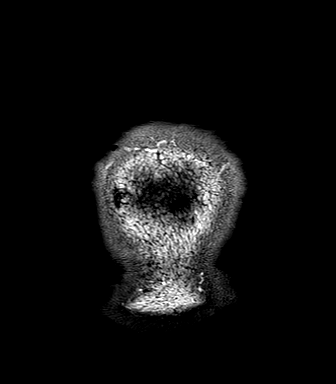
[im 47/47]
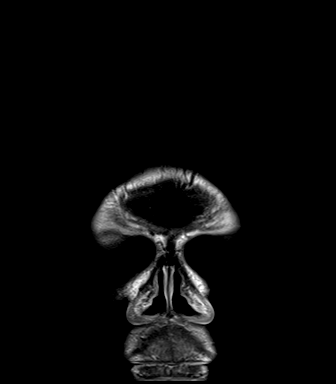

[Series 14: FLAIR post-contrast · sagittal · 3.0mm · 0.75mm/px · 2 of 39 slices shown]
[im 1/39]
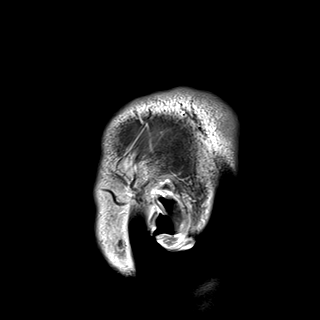
[im 39/39]
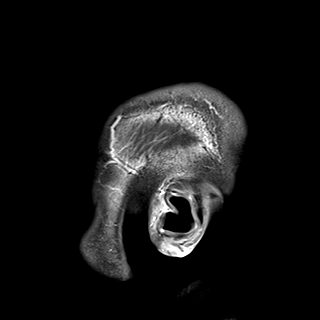

[48 of 48 positions shown; findings below may reference images not displayed]

FINDINGS: Brain: 5 mm enhancing mass in the posterior left temporal lobe with
mild edema.

Punctate (2 mm) subcortical enhancing mass in the medial right
temporal lobe.

No incidental infarct, hemorrhage, hydrocephalus, or collection.
Small vessel ischemic type change in the hemispheric white matter
that is mild but unexpected for age

Vascular: Preserved flow voids and vascular enhancements. Tiny
developmental venous anomalies in the upper and rightward
cerebellum.

Skull and upper cervical spine: Small sclerotic focus in the
posterior left parietal bone which is nonenhancing and
benign-appearing.

Sinuses/Orbits: Negative

Other: Prominent right suboccipital lymph node which is not
hypermetabolic by PET CT.
IMPRESSION: Two cerebral metastases measuring 5 mm in the left temporal lobe and
2 mm in the right temporal lobe.

## 2021-01-19 MED ORDER — GADOBENATE DIMEGLUMINE 529 MG/ML IV SOLN
20.0000 mL | Freq: Once | INTRAVENOUS | Status: AC | PRN
  Administered 2021-01-19: 20 mL via INTRAVENOUS

## 2021-01-19 NOTE — Telephone Encounter (Signed)
I called the patient to notify him that [name] was made aware of his symptoms. I advised the patient to contact us if his symptoms persisted or got worse for any reason. Pt was advised to take Tylenol for pain and to try an avoid NSAIDs. Pt verbalized understanding.

## 2021-01-19 NOTE — Progress Notes (Signed)
[facility]  Clinical Social Work received phone call from radiation oncology RN regarding transportation concerns for today's appointment at [facility].  CSW contacted patient by phone to discuss barriers.   Mr. Schrieber shared he does not have transportation difficulties, but has other matters to attend to today.  CSW and patient discussed importance and urgency of completing MRI to follow up with scheduled appointments at [facility] next week.  Patient plans to attend today's appointment.  CSW encouraged patient to call with other questions or concerns.  Gwinda Maine, LCSW  Clinical Social Worker [facility]

## 2021-01-19 NOTE — Telephone Encounter (Signed)
-----   Message from [name], MD sent at 01/19/2021  9:55 AM EDT ----- Madaline Brilliant. Noted. Thanks ----- Message ----- From: Burna Mortimer, CMA Sent: 01/19/2021   9:55 AM EDT To: [name], MD  [name],  Pt reports that he began to have bilateral flank pain last night. Pt attributes this to his radiation that he received on 7/12. Pt describes the flank pain as very sharp but feels as if it is easing up now. He confirms that he did not take any medication for the pain. Pt denied any further symptoms at the present time but wanted to make you aware. Please advise.  Thank you, Myriam Jacobson

## 2021-01-20 ENCOUNTER — Other Ambulatory Visit: Payer: Self-pay

## 2021-01-20 ENCOUNTER — Emergency Department (HOSPITAL_COMMUNITY): Payer: No Typology Code available for payment source

## 2021-01-20 ENCOUNTER — Emergency Department (HOSPITAL_COMMUNITY)
Admission: EM | Admit: 2021-01-20 | Discharge: 2021-01-20 | Disposition: A | Discharge status: Home / Self Care | Payer: No Typology Code available for payment source | Attending: Emergency Medicine | Admitting: Emergency Medicine

## 2021-01-20 ENCOUNTER — Encounter (HOSPITAL_COMMUNITY): Payer: Self-pay | Admitting: Emergency Medicine

## 2021-01-20 DIAGNOSIS — Z85528 Personal history of other malignant neoplasm of kidney: Secondary | ICD-10-CM | POA: Diagnosis not present

## 2021-01-20 DIAGNOSIS — R0600 Dyspnea, unspecified: Secondary | ICD-10-CM | POA: Diagnosis present

## 2021-01-20 DIAGNOSIS — Z20822 Contact with and (suspected) exposure to covid-19: Secondary | ICD-10-CM | POA: Diagnosis not present

## 2021-01-20 DIAGNOSIS — F17210 Nicotine dependence, cigarettes, uncomplicated: Secondary | ICD-10-CM | POA: Diagnosis not present

## 2021-01-20 DIAGNOSIS — R0602 Shortness of breath: Secondary | ICD-10-CM

## 2021-01-20 DIAGNOSIS — J90 Pleural effusion, not elsewhere classified: Secondary | ICD-10-CM | POA: Diagnosis not present

## 2021-01-20 LAB — COMPREHENSIVE METABOLIC PANEL
ALT: 67 U/L — ABNORMAL HIGH (ref 0–44)
AST: 40 U/L (ref 15–41)
Albumin: 3.6 g/dL (ref 3.5–5.0)
Alkaline Phosphatase: 84 U/L (ref 38–126)
Anion gap: 9 (ref 5–15)
BUN: 8 mg/dL (ref 6–20)
CO2: 21 mmol/L — ABNORMAL LOW (ref 22–32)
Calcium: 9.7 mg/dL (ref 8.9–10.3)
Chloride: 104 mmol/L (ref 98–111)
Creatinine, Ser: 0.7 mg/dL (ref 0.61–1.24)
GFR, Estimated: >60 mL/min (ref >60)
Glucose, Bld: 109 mg/dL — ABNORMAL HIGH (ref 70–99)
Potassium: 3.9 mmol/L (ref 3.5–5.1)
Sodium: 134 mmol/L — ABNORMAL LOW (ref 135–145)
Total Bilirubin: 0.9 mg/dL (ref 0.3–1.2)
Total Protein: 7.7 g/dL (ref 6.5–8.1)

## 2021-01-20 LAB — CBC
HCT: 35 % — ABNORMAL LOW (ref 39.0–52.0)
Hemoglobin: 11.3 g/dL — ABNORMAL LOW (ref 13.0–17.0)
MCH: 24.4 pg — ABNORMAL LOW (ref 26.0–34.0)
MCHC: 32.3 g/dL (ref 30.0–36.0)
MCV: 75.6 fL — ABNORMAL LOW (ref 80.0–100.0)
Platelets: 332 10*3/uL (ref 150–400)
RBC: 4.63 MIL/uL (ref 4.22–5.81)
RDW: 14.2 % (ref 11.5–15.5)
WBC: 5.9 10*3/uL (ref 4.0–10.5)
nRBC: 0 % (ref 0.0–0.2)

## 2021-01-20 LAB — RESP PANEL BY RT-PCR (FLU A&B, COVID) ARPGX2
Influenza A by PCR: NEGATIVE
Influenza B by PCR: NEGATIVE
SARS Coronavirus 2 by RT PCR: NEGATIVE

## 2021-01-20 IMAGING — DX DG CHEST 1V PORT
1 series · 1 of 1 positions shown · non-contrast
Comparison: [DATE] and older studies.

CLINICAL DATA: Patient receiving chemotherapy. Short of breath and
weakness for 3 days.

EXAM:
PORTABLE CHEST 1 VIEW

[chest ap]
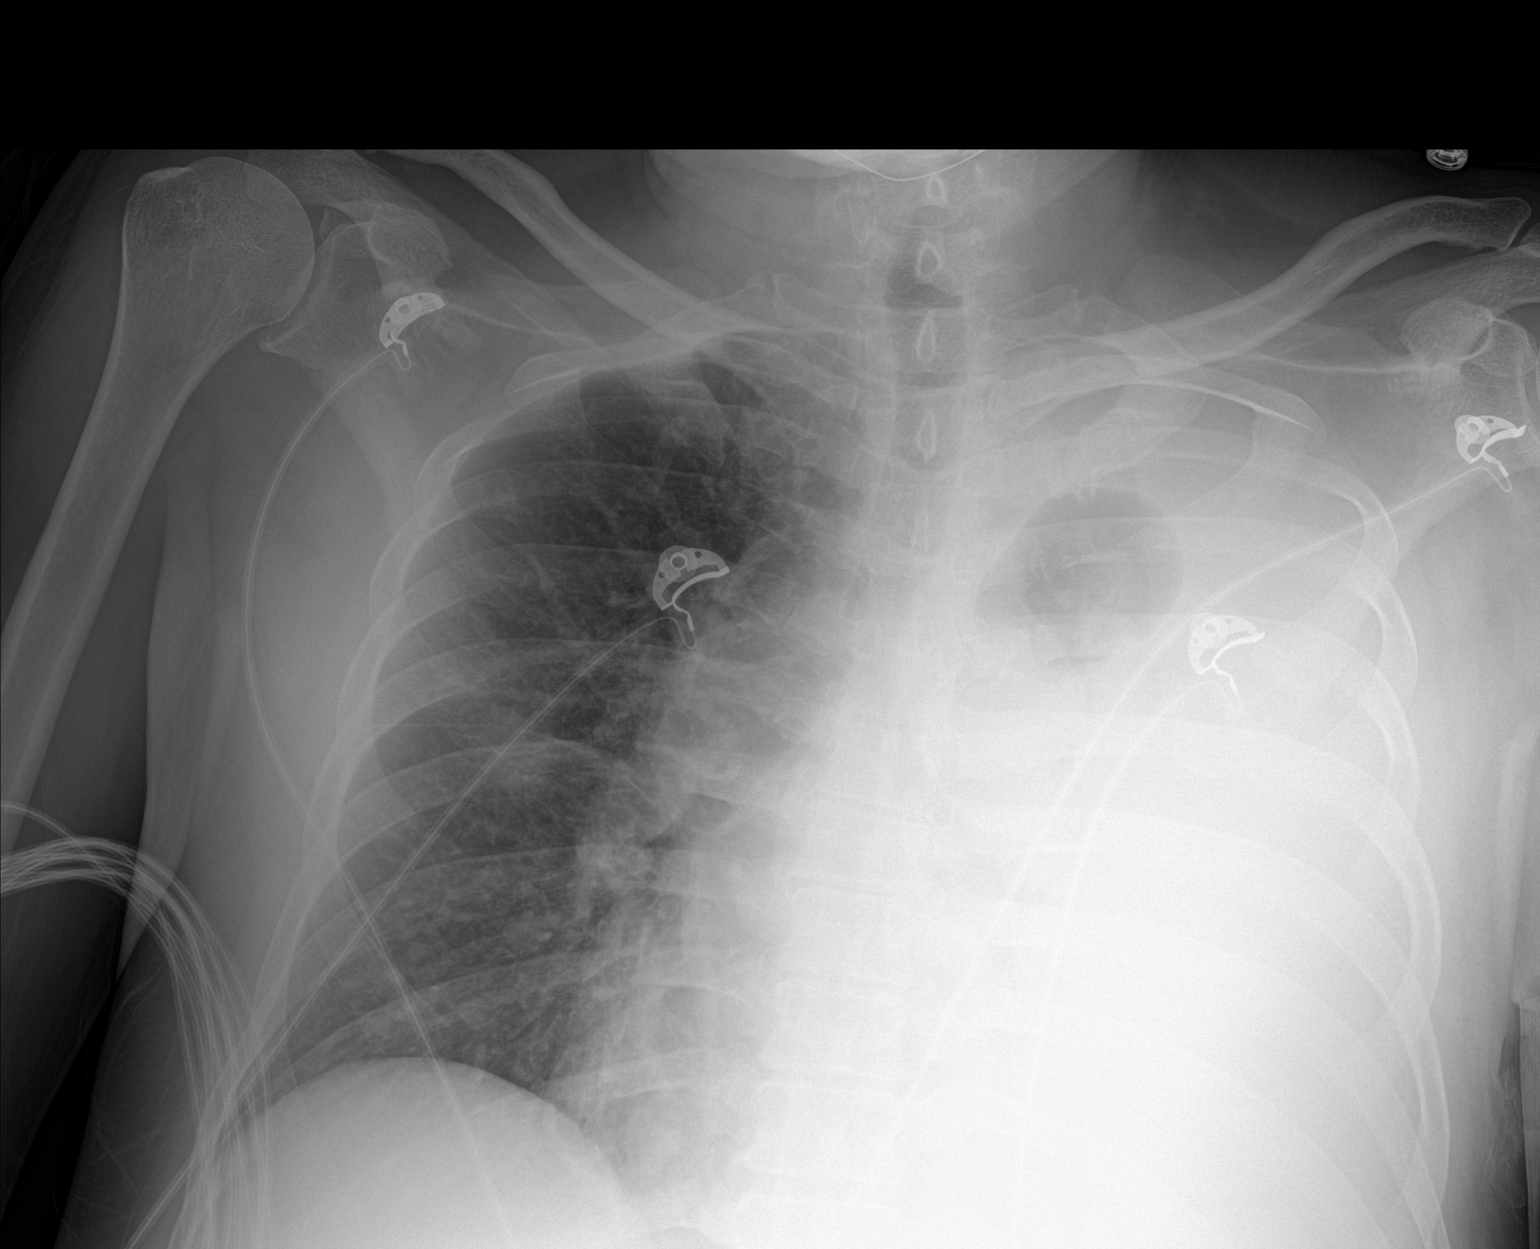

[1 of 1 positions shown; findings below may reference images not displayed]

FINDINGS: Near complete opacification of the left hemithorax. Only a small
portion of the left upper lobe is aerated. No significant
mediastinal shift.

Ill-defined right lung nodules, largest superimposed over the minor
fissure, stable. No acute findings in the right lung. No right
pleural effusion. No evidence of a pneumothorax.

Skeletal structures are grossly intact.
IMPRESSION: 1. Near complete opacification of the left hemithorax. This is most
likely due to an increase in the left pleural effusion with
increased compressive atelectasis.
2. No other change from the prior study. Ill-defined lung nodules
noted consistent metastatic disease.

## 2021-01-20 IMAGING — CR DG CHEST 2V
2 series · 2 of 2 positions shown · non-contrast
Comparison: Chest radiograph dated [DATE] at [05] hours. CT
chest dated [DATE].

CLINICAL DATA: Post thoracentesis

EXAM:
CHEST - 2 VIEW

[w chest pa]
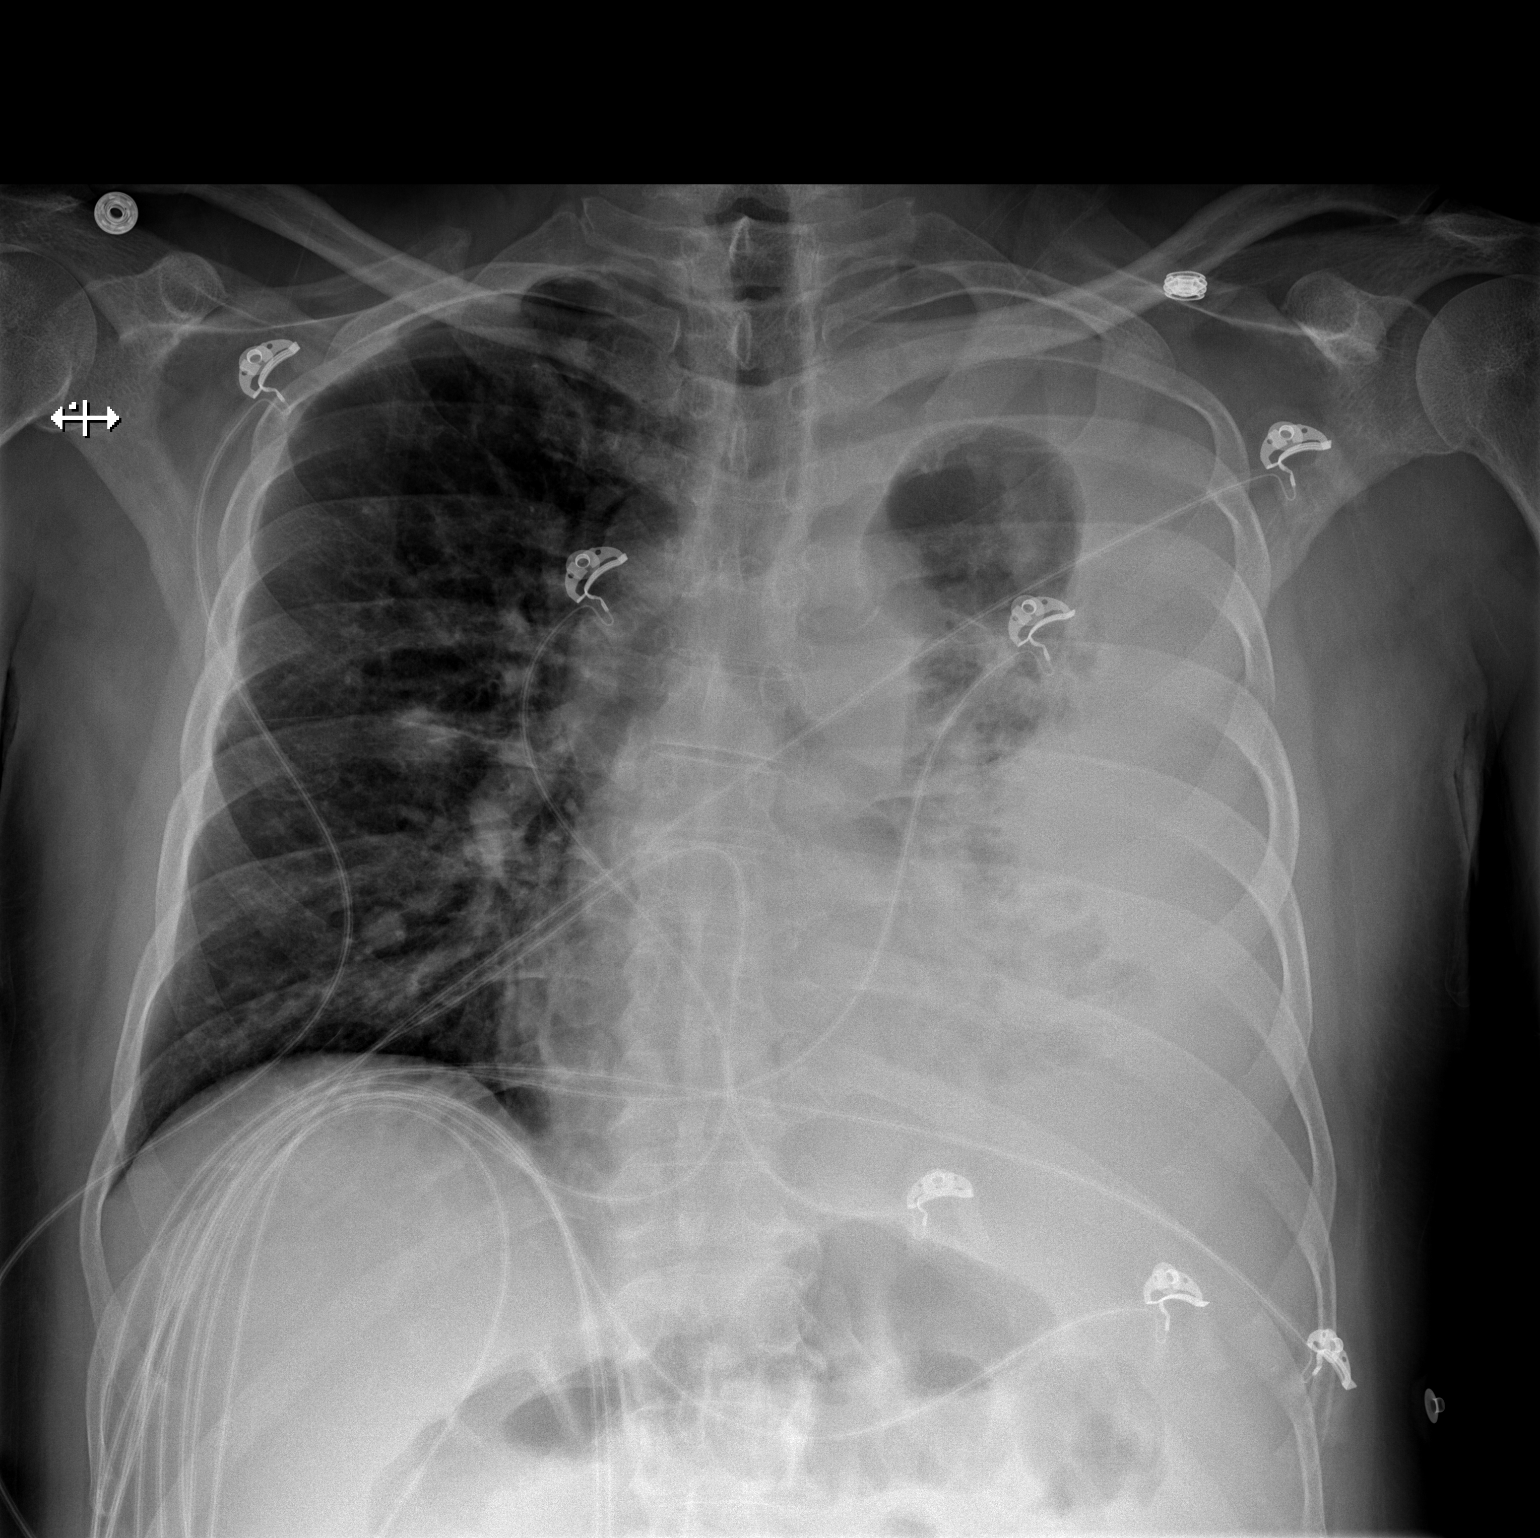

[w chest lat]
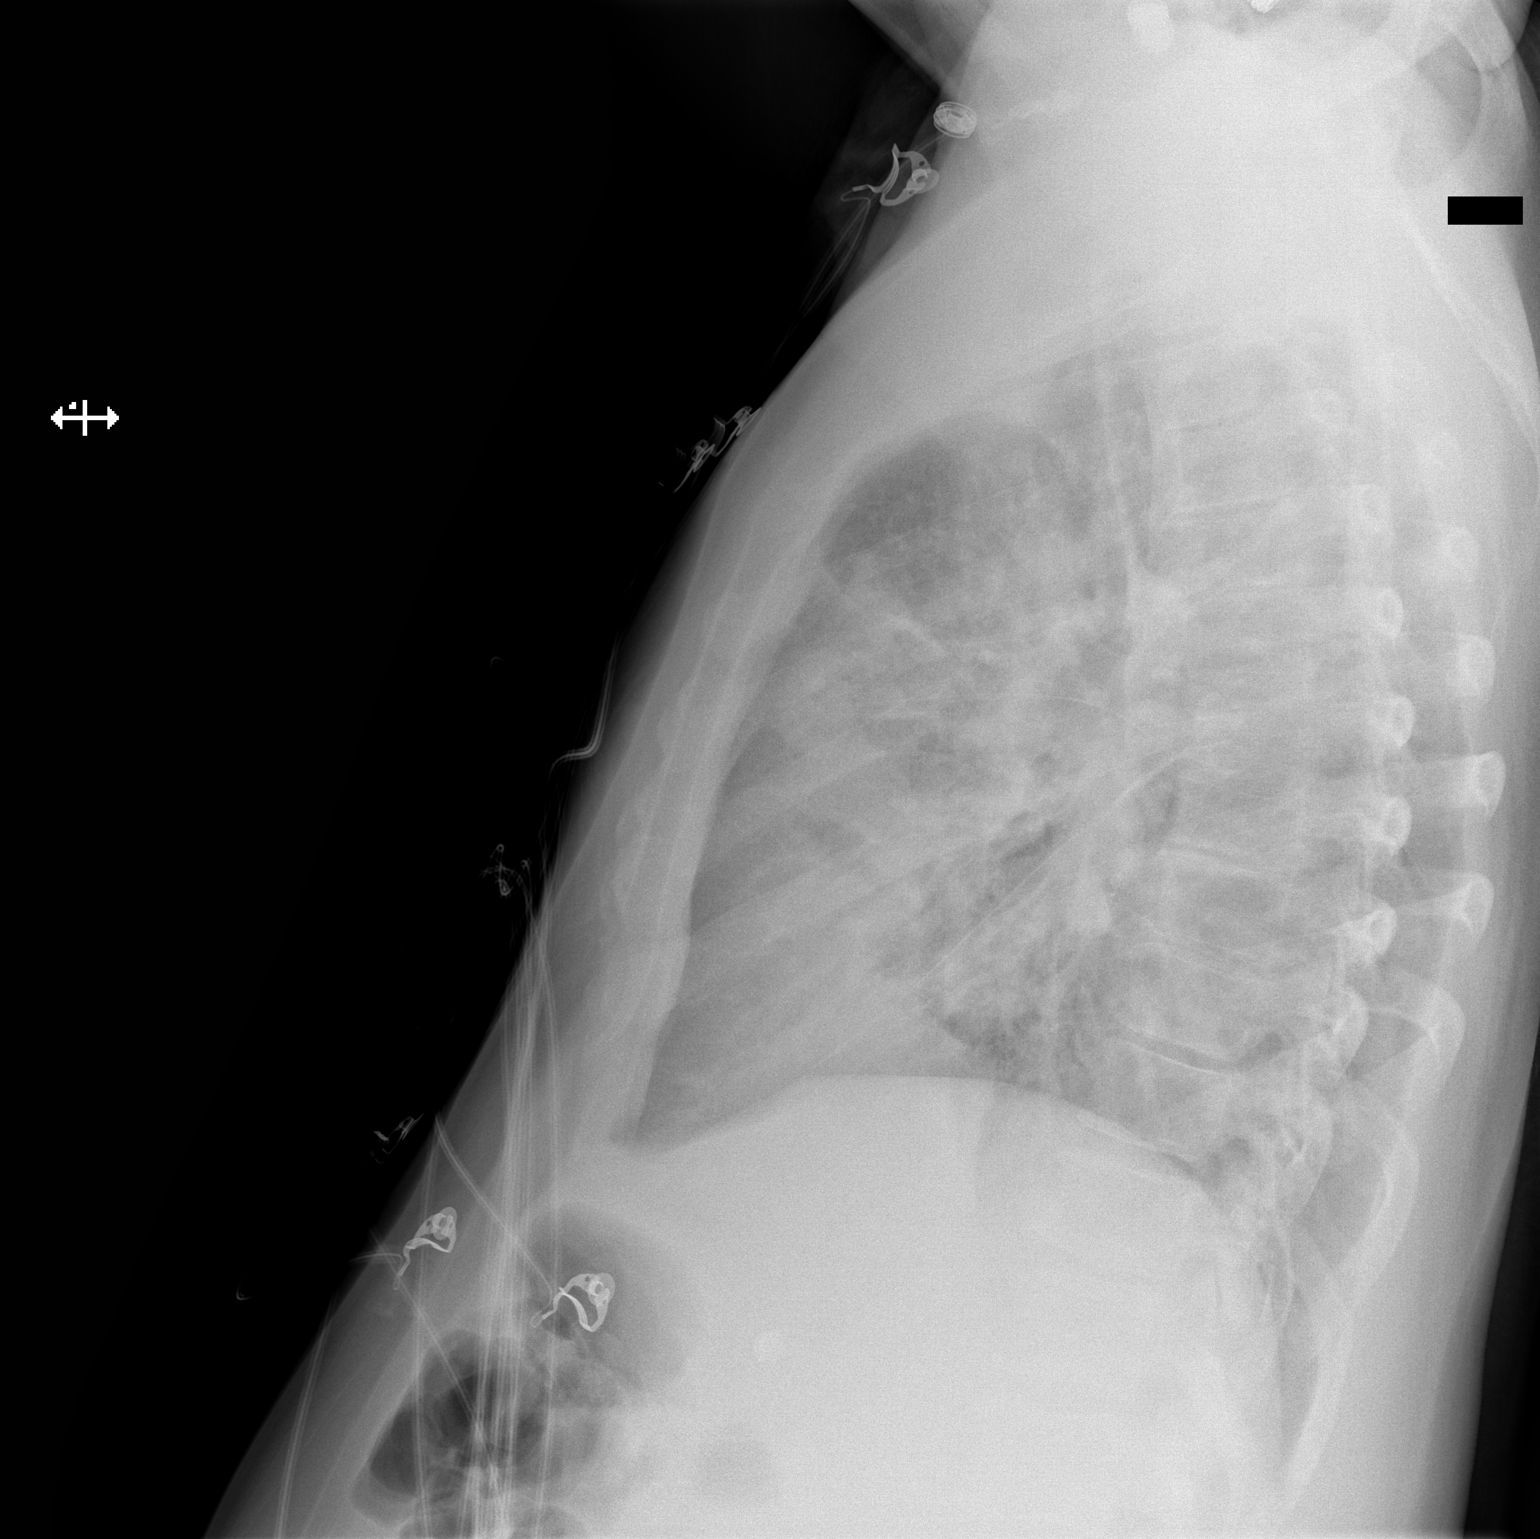

[2 of 2 positions shown; findings below may reference images not displayed]

FINDINGS: No pneumothorax following left thoracentesis.

Near complete opacification of left hemithorax, reflecting a large
left pleural effusion, with associated left lung collapse.

Scattered right lung nodules, corresponding to the patient's known
pulmonary metastases.

The heart is normal in size, partially obscured.
IMPRESSION: No pneumothorax following left thoracentesis.

Otherwise unchanged.

## 2021-01-20 MED ORDER — LIDOCAINE-EPINEPHRINE 2 %-1:100000 IJ SOLN
20.0000 mL | Freq: Once | INTRAMUSCULAR | Status: AC
  Administered 2021-01-20: 20 mL
  Filled 2021-01-20: qty 1

## 2021-01-20 NOTE — ED Triage Notes (Signed)
Patient reports SOB which has progressively worsened over the past two days. He was previously seen at ED for same complaint and was diagnosed with renal carcinoma w/ mets. He states he feels like he cannot catch his breath. He reports having one treatment of chemotherapy thus far. Denies fevers, chills or body aches. No hx of blood clots.

## 2021-01-20 NOTE — Discharge Instructions (Addendum)
Return if you have worsening shortness of breath Please keep your appointment on Tuesday for repeat thoracentesis

## 2021-01-20 NOTE — ED Provider Notes (Signed)
[facility] DEPT Provider Note   CSN: [id number] Arrival date & time: 01/20/21  1856     History Chief Complaint  Patient presents with   Shortness of Breath    Dylan Good is a 50 y.o. male.  HPI  50 year old man history of stage IV kidney cancer, pulmonary nodules, and pleural effusion with thoracentesis June 22 and June 28, currently on chemotherapy of ipilimumab and nivolumab have started on July 11 who presents today with dyspnea.  He states that symptoms began this morning.  Had waxed and waned some through the day.  Currently appears worse than they were.  He has had some mild cough.  He does not had any fever or chills.  He has had 2 COVID vaccines and had a booster.  He denies any pain.  Symptoms are worse with exertion.  History reviewed. No pertinent past medical history.  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 No pertinent family history.  Social History   Tobacco Use   Smoking status: Some Days    Packs/day: 0.50    Types: Cigarettes   Smokeless tobacco: Never  Substance Use Topics   Alcohol use: Never   Drug use: Never    Home Medications Prior to Admission medications   Medication Sig Start Date End Date Taking? Authorizing Provider  amoxicillin (AMOXIL) 500 MG capsule Take 2 capsules (1,000 mg total) by mouth 3 (three) times daily. 12/02/20   Horton, Barbette Hair, MD  azithromycin (ZITHROMAX) 250 MG tablet Take 1 tablet (250 mg total) by mouth daily. Take first 2 tablets together, then 1 every day until finished. 12/02/20   Horton, Barbette Hair, MD  HYDROcodone bit-homatropine (HYCODAN) 5-1.5 MG/5ML syrup Take 5 mLs by mouth every 6 (six) hours as needed for cough. 01/15/21   [name], MD  naproxen  (NAPROSYN) 500 MG tablet Take 1 tablet (500 mg total) by mouth 2 (two) times daily. 12/02/20   Horton, Barbette Hair, MD  prochlorperazine (COMPAZINE) 10 MG tablet Take 1 tablet (10 mg total) by mouth every 6 (six) hours as needed for nausea or vomiting. 01/09/21   [name], MD    Allergies    Patient has no known allergies.  Review of Systems   Review of Systems  All other systems reviewed and are negative.  Physical Exam Updated Vital Signs BP (!) 174/103 (BP Location: Right Arm)   Pulse (!) 115   Temp 98.7 F (37.1 C) (Oral)   Resp (!) 40   SpO2 99%   Physical Exam Vitals and nursing note reviewed.  Constitutional:      Appearance: He is well-developed. He is ill-appearing.  HENT:     Head: Normocephalic.     Mouth/Throat:     Mouth: Mucous membranes are moist.  Eyes:     Pupils: Pupils are equal, round, and reactive to light.  Cardiovascular:     Rate and Rhythm: Regular rhythm. Tachycardia present.  Pulmonary:     Effort: Tachypnea present.     Breath sounds: Examination of the right-lower field reveals decreased breath sounds. Decreased breath sounds present. No wheezing or rhonchi.  Chest:     Chest wall: No mass or deformity.  Abdominal:  General: Bowel sounds are normal.     Palpations: Abdomen is soft.  Musculoskeletal:        General: Normal range of motion.     Cervical back: Normal range of motion.     Right lower leg: No tenderness. No edema.     Left lower leg: No tenderness. No edema.  Skin:    General: Skin is warm and dry.     Capillary Refill: Capillary refill takes less than 2 seconds.  Neurological:     General: No focal deficit present.     Mental Status: He is alert.  Psychiatric:        Mood and Affect: Mood normal.    ED Results / Procedures / Treatments   Labs (all labs ordered are listed, but only abnormal results are displayed) Labs Reviewed  RESP PANEL BY RT-PCR (FLU A&B, COVID) [id number]  CBC  COMPREHENSIVE METABOLIC PANEL     EKG EKG Interpretation  Date/Time:  Saturday January 20 2021 19:23:43 EDT Ventricular Rate:  111 PR Interval:  151 QRS Duration: 65 QT Interval:  314 QTC Calculation: 427 R Axis:   57 Text Interpretation: Sinus tachycardia Anteroseptal infarct, age indeterminate Confirmed by Pattricia Boss [phone]) on 01/20/2021 11:06:20 PM  Radiology DG Chest 2 View  Result Date: 01/20/2021 CLINICAL DATA:  Post thoracentesis EXAM: CHEST - 2 VIEW COMPARISON:  Chest radiograph dated 01/20/2021 at 1929 hours. CT chest dated 12/20/2020. FINDINGS: No pneumothorax following left thoracentesis. Near complete opacification of left hemithorax, reflecting a large left pleural effusion, with associated left lung collapse. Scattered right lung nodules, corresponding to the patient's known pulmonary metastases. The heart is normal in size, partially obscured. IMPRESSION: No pneumothorax following left thoracentesis. Otherwise unchanged. Electronically Signed   By: Julian Hy M.D.   On: 01/20/2021 22:44   DG Chest Port 1 View  Result Date: 01/20/2021 CLINICAL DATA:  Patient receiving chemotherapy. Short of breath and weakness for 3 days. EXAM: PORTABLE CHEST 1 VIEW COMPARISON:  01/02/2021 and older studies. FINDINGS: Near complete opacification of the left hemithorax. Only a small portion of the left upper lobe is aerated. No significant mediastinal shift. Ill-defined right lung nodules, largest superimposed over the minor fissure, stable. No acute findings in the right lung. No right pleural effusion. No evidence of a pneumothorax. Skeletal structures are grossly intact. IMPRESSION: 1. Near complete opacification of the left hemithorax. This is most likely due to an increase in the left pleural effusion with increased compressive atelectasis. 2. No other change from the prior study. Ill-defined lung nodules noted consistent metastatic disease. Electronically Signed   By: Lajean Manes M.D.   On: 01/20/2021 19:58     Procedures Thoracentesis Bedside  Date/Time: 01/20/2021 11:03 PM Performed by: Pattricia Boss, MD Authorized by: Pattricia Boss, MD   Consent:    Consent obtained:  Written   Consent given by:  Patient   Risks discussed:  Bleeding, infection, pain, pneumothorax and incomplete drainage   Alternatives discussed:  No treatment Universal protocol:    Procedure explained and questions answered to patient or proxy's satisfaction: yes     Relevant documents present and verified: yes     Test results available: yes     Imaging studies available: yes     Site/side marked: yes     Immediately prior to procedure, a time out was called: yes     Patient identity confirmed:  Arm band and verbally with patient Sedation:    Sedation type:  None Anesthesia:  Anesthesia method:  Local infiltration Procedure details:    Preparation: Patient was prepped and draped in usual sterile fashion     Patient position:  Sitting   Location:  L posterior   Intercostal space:  5th   Puncture method:  Over-the-needle catheter   Ultrasound guidance: yes     Indwelling catheter placed: no     Needle gauge:  16   Drainage characteristics:  Bloody and serosanguinous Post-procedure details:    Chest x-Merilyn Pagan performed: yes     Chest x-Tracee Mccreery findings:  Pleural effusion improved   Procedure completion:  Tolerated Comments:     Patient symptomatically improved post procedure   Medications Ordered in ED Medications - No data to display  ED Course  I have reviewed the triage vital signs and the nursing notes.  Pertinent labs & imaging results that were available during my care of the patient were reviewed by me and considered in my medical decision making (see chart for details).    MDM Rules/Calculators/A&P                          50 year old man with metastatic renal cancer with lung nodules and effusion.  Patient has previously had the effusion drained.  He has had worsening dyspnea.  He has  opacification of the left side of his chest.  Here in the ED we removed 1.8 L of serosanguineous fluid and patient is clinically improved.  Post procedure x-Audwin Semper showed no evidence of pneumothorax.  Patient is scheduled for thoracentesis in IR on Tuesday.  He is advised regarding return precautions and need for follow-up and voices understanding. Final Clinical Impression(s) / ED Diagnoses Final diagnoses:  Shortness of breath  Pleural effusion    Rx / DC Orders ED Discharge Orders     None        Pattricia Boss, MD 01/20/21 2307

## 2021-01-22 ENCOUNTER — Institutional Professional Consult (permissible substitution): Payer: No Typology Code available for payment source | Admitting: Radiation Oncology

## 2021-01-22 NOTE — Progress Notes (Addendum)
Radiation Oncology         [phone]) [phone] ________________________________  Initial Outpatient Consultation  Name: Dylan Good MRN: [id number]  Date: 01/23/2021  DOB: 1971/06/25  MH:[id number], No Pcp Per (Inactive)  [name], MD   REFERRING PHYSICIAN: [name], MD  DIAGNOSIS:  C79.31 Brain metastases   STAGE IV putative renal cancer    ICD-10-CM   1. 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 Brain metastases [facility])  C79.31          HISTORY OF PRESENT ILLNESS::Dylan Good is a 50 y.o. male who presented to the ED on 12/20/20 with shortness of breath    CT of the chest taken on 12/20/20 during this hospital visit  showed numerous bilateral pulmonary nodules concerning for metastatic disease. More specifically, pleural based metastatic disease was seen in the left hemithorax with large pleural effusion. Also seen was a possible right renal mass concerning for malignancy given the pulmonary findings. The overall findings were concerning for renal cell carcinoma with metastatic disease. Chest x-ray taken on this same date showed worsening left-sided pleural effusion and left basilar consolidation as well as patchy nodular densities bilaterally.  MRI of the brain on 01/10/21 demonstrated a 5 mm enhancing left temporal lobe lesion with mild edema most compatible with a solitary metastasis. Also seen were mild cerebral white matter T2 signal changes,noted to be nonspecific.   PET scan taken on 01/17/21 showed the following findings:  - a large aggressive right renal mass with peripheral metabolic activity consistent with primary  renal carcinoma. The mass was noted to extend to the porta hepatis and ventral peritoneal surface.  -extensive thoracic metastasis with left lung lobe metastasis as well as nodular pleural metastasis in the left pleural space. Pleural metastasis was noted to directly invade the left fifth rib.  -a very large left pleural effusion with passive atelectasis of the left lung -nodular metastasis to the right lung  -Bulky mediastinal paratracheal metastatic adenopathy -an oblong mass in the left suprarenal space of unclear etiology but noted to be possibly malignant  -a small subcutaneous nodule metastasis superficial to the right gluteus muscle  MRI of the brain on 01/19/21 demonstrated two cerebral metastasis measuring 5 mm int he left temporal lobe, and 2 mm in the right temporal lobe.  Chest x-ray taken on 01/20/21 showed near complete opacification of the left hemithorax, noted to be most likely due to an increase in the left pleural effusion with increased compressive atelectasis. Also seen were the ill-defined lung nodules previously seen; consistent with metastatic disease.    I have personally reviewed his images and shared the patient's recent MRI and PET scan with him and his partner.  The patient has been following up with [name]; he most recently followed up with him on 01/15/21 in which it was noted that the patient continues to have issues with dyspnea on exertion as well as recurrent cough. It was also noted that the patient underwent repeat  thoracentesis on January 02, 2021 where in which 1.7 L was removed. The patient also reported some nausea with  occasional vomiting and poor appetite at times.  Tissue diagnosis has not been established, biopsy pending.  Thoracentesis revealed atypical cells  The patient and his partner report emotional distress.  The patient is interested in seeing a pulmonologist to consider repeat thoracentesis and has questions about inhalers and home oxygen for  his breathing  He has started immunotherapy with [name].  He denies bone pain but does have discomfort in the left hemithorax that improved after thoracentesis and has since returned.  PREVIOUS RADIATION THERAPY: No  PAST MEDICAL HISTORY:  has a past medical history of Cancer, metastatic to lung ([place]) and Renal cell adenocarcinoma, left ([place]).    PAST SURGICAL HISTORY: Past Surgical History:  Procedure Laterality Date   APPENDECTOMY      FAMILY HISTORY: family history is not on file.  SOCIAL HISTORY:  reports that he has been smoking cigarettes. He has been smoking an average of .5 packs per day. He has never used smokeless tobacco. He reports that he does not drink alcohol and does not use drugs.  ALLERGIES: Patient has no known allergies.  MEDICATIONS:  Current Outpatient Medications  Medication Sig Dispense Refill   HYDROcodone bit-homatropine (HYCODAN) 5-1.5 MG/5ML syrup Take 5 mLs by mouth every 6 (six) hours as needed for cough. 120 mL 0   prochlorperazine (COMPAZINE) 10 MG tablet Take 1 tablet (10 mg total) by mouth every 6 (six) hours as needed for nausea or vomiting. 30 tablet 0   No current facility-administered medications for this encounter.    REVIEW OF SYSTEMS:  As above.   PHYSICAL EXAM:  height is 5\' 9"  (1.753 m) and weight is 194 lb 3.2 oz (88.1 kg). His blood pressure is 156/93 (abnormal) and his pulse is 110 (abnormal). His respiration is 20 and oxygen saturation is 99%.   General: Alert and oriented, in no acute distress   HEENT: Head is normocephalic. Extraocular movements are intact. Oropharynx is clear. Heart: Regular in rate and rhythm with a systolic murmur loudest at the aortic region. Chest: Markedly decreased breath sounds in left chest Abdomen: Abdomen is firm and slightly distended without rigidity or guarding Musculoskeletal: symmetric strength and muscle tone throughout. Neurologic: Cranial nerves II through XII are grossly intact. No obvious  focalities. Speech is fluent. Coordination is intact.  Object recall is 2 out of 3 at 3 minutes. Psychiatric: Judgment and insight are intact. Affect is appropriate.  KPS =  70  100 - Normal; no complaints; no evidence of disease. 90   - Able to carry on normal activity; minor signs or symptoms of disease. 80   - Normal activity with effort; some signs or symptoms of disease. 73   - Cares for self; unable to carry on normal activity or to do active work. 60   - Requires occasional assistance, but is able to care for most of his personal needs. 50   - Requires considerable assistance and frequent medical care. 57   - Disabled; requires special care and assistance. 46   - Severely disabled; hospital admission is indicated although death not imminent. 53   - Very sick; hospital admission necessary; active supportive treatment necessary. 10   - Moribund; fatal processes progressing rapidly. 0     - Dead  Karnofsky DA, Abelmann WH, Craver LS and Burchenal [facility] [phone]) The use of the nitrogen mustards in the palliative treatment of carcinoma: with particular reference to bronchogenic carcinoma Cancer 1 [id number]   LABORATORY DATA:  Lab Results  Component Value  Date   WBC 5.9 01/20/2021   HGB 11.3 (L) 01/20/2021   HCT 35.0 (L) 01/20/2021   MCV 75.6 (L) 01/20/2021   PLT 332 01/20/2021   CMP     Component Value Date/Time   NA 134 (L) 01/20/2021 1911   K 3.9 01/20/2021 1911   CL 104 01/20/2021 1911   CO2 21 (L) 01/20/2021 1911   GLUCOSE 109 (H) 01/20/2021 1911   BUN 8 01/20/2021 1911   CREATININE 0.70 01/20/2021 1911   CREATININE 0.95 01/15/2021 0814   CALCIUM 9.7 01/20/2021 1911   PROT 7.7 01/20/2021 1911   ALBUMIN 3.6 01/20/2021 1911   AST 40 01/20/2021 1911   AST 21 01/15/2021 0814   ALT 67 (H) 01/20/2021 1911   ALT 42 01/15/2021 0814   ALKPHOS 84 01/20/2021 1911   BILITOT 0.9 01/20/2021 1911   BILITOT 0.7 01/15/2021 0814   GFRNONAA >60 01/20/2021 1911   GFRNONAA >60 01/15/2021  0814         RADIOGRAPHY: DG Chest 1 View  Result Date: 01/02/2021 CLINICAL DATA:  Status post left thoracentesis EXAM: CHEST  1 VIEW COMPARISON:  12/20/2020 FINDINGS: Mild cardiomegaly. Interval reduction in volume of a left pleural effusion, now small, with residual associated atelectasis or consolidation. Numerous bilateral pulmonary nodules not significantly changed in radiographic appearance. IMPRESSION: 1. Interval reduction in volume of a left pleural effusion, now small, with residual associated atelectasis or consolidation. No pneumothorax. 2. Numerous bilateral pulmonary nodules not significantly changed in radiographic appearance. Electronically Signed   By: Eddie Candle M.D.   On: 01/02/2021 15:13   DG Chest 2 View  Result Date: 01/20/2021 CLINICAL DATA:  Post thoracentesis EXAM: CHEST - 2 VIEW COMPARISON:  Chest radiograph dated 01/20/2021 at 1929 hours. CT chest dated 12/20/2020. FINDINGS: No pneumothorax following left thoracentesis. Near complete opacification of left hemithorax, reflecting a large left pleural effusion, with associated left lung collapse. Scattered right lung nodules, corresponding to the patient's known pulmonary metastases. The heart is normal in size, partially obscured. IMPRESSION: No pneumothorax following left thoracentesis. Otherwise unchanged. Electronically Signed   By: Julian Hy M.D.   On: 01/20/2021 22:44   MR Brain W Wo Contrast  Result Date: 01/21/2021 CLINICAL DATA:  Staging brain metastases.  Kidney cancer. EXAM: MRI HEAD WITHOUT AND WITH CONTRAST TECHNIQUE: Multiplanar, multiecho pulse sequences of the brain and surrounding structures were obtained without and with intravenous contrast. CONTRAST:  55mL MULTIHANCE GADOBENATE DIMEGLUMINE 529 MG/ML IV SOLN COMPARISON:  None. FINDINGS: Brain: 5 mm enhancing mass in the posterior left temporal lobe with mild edema. Punctate (2 mm) subcortical enhancing mass in the medial right temporal lobe. No  incidental infarct, hemorrhage, hydrocephalus, or collection. Small vessel ischemic type change in the hemispheric white matter that is mild but unexpected for age Vascular: Preserved flow voids and vascular enhancements. Tiny developmental venous anomalies in the upper and rightward cerebellum. Skull and upper cervical spine: Small sclerotic focus in the posterior left parietal bone which is nonenhancing and benign-appearing. Sinuses/Orbits: Negative Other: Prominent right suboccipital lymph node which is not hypermetabolic by PET CT. IMPRESSION: Two cerebral metastases measuring 5 mm in the left temporal lobe and 2 mm in the right temporal lobe. Electronically Signed   By: Monte Fantasia M.D.   On: 01/21/2021 11:38   MR Brain W Wo Contrast  Result Date: 01/11/2021 CLINICAL DATA:  Urologic cancer. Thoracic metastases and suspected right renal mass. EXAM: MRI HEAD WITHOUT AND WITH CONTRAST TECHNIQUE: Multiplanar, multiecho pulse sequences of  the brain and surrounding structures were obtained without and with intravenous contrast. CONTRAST:  79mL GADAVIST GADOBUTROL 1 MMOL/ML IV SOLN COMPARISON:  None. FINDINGS: Brain: There is a 5 mm solidly enhancing lesion in the posterior aspect of the left superior temporal gyrus with mild surrounding edema and a small amount of susceptibility artifact which may reflect chronic blood products. No second enhancing intracranial lesion is identified. There are multiple small foci of T2 FLAIR hyperintensity scattered throughout the cerebral white matter bilaterally with most notable involvement of the periventricular white matter including an 8 mm focus adjacent to the right frontal horn. White matter disease is overall mild in severity but is abnormal for age. No acute infarct, midline shift, or extra-axial fluid collection is identified. Cerebral volume is borderline low for age. Vascular: Major intracranial vascular flow voids are preserved. Skull and upper cervical spine: No  suspicious marrow lesion. Sinuses/Orbits: Unremarkable orbits. Paranasal sinuses and mastoid air cells are clear. Other: None. IMPRESSION: 1. 5 mm enhancing left temporal lobe lesion with mild edema most compatible with a solitary metastasis. 2. Mild cerebral white matter T2 signal changes, nonspecific. Considerations include early chronic small vessel ischemia, migraines, prior infection/inflammation, and demyelinating disease. Electronically Signed   By: Logan Bores M.D.   On: 01/11/2021 14:36   NM PET Image Initial (PI) Skull Base To Thigh  Result Date: 01/18/2021 CLINICAL DATA:  Initial treatment strategy for urologic carcinoma. EXAM: NUCLEAR MEDICINE PET SKULL BASE TO THIGH TECHNIQUE: 10.4 mCi F-18 FDG was injected intravenously. Full-ring PET imaging was performed from the skull base to thigh after the radiotracer. CT data was obtained and used for attenuation correction and anatomic localization. Fasting blood glucose: 112 mg/dl COMPARISON:  Chest CT 12/20/2020 FINDINGS: Mediastinal blood pool activity: SUV max 2.4 Liver activity: SUV max NA NECK: No hypermetabolic lymph nodes in the neck. Incidental CT findings: none CHEST: There is a large LEFT pleural effusion occupying the near entirety of the LEFT hemithorax. Only a small volume of the LEFT upper lobe is aerated. The LEFT lower lobe and large portion of the LEFT upper lobe are collapsed centrally around the hilum. Within this collapsed lower lobe, hypermetabolic focus with SUV max equal 8.9 (image 89). Along the lateral pleural surface of the LEFT lower hemithorax there is nodular soft tissue with intense metabolic activity measuring 8 cm x 2 cm with SUV max 11.9. A similar finding in the lateral mid LEFT hemithorax with SUV max equal 6.3. This nodular pleural thickening erodes the adjacent fifth rib (image 76) There is a large hypermetabolic RIGHT lower paratracheal node which is bulky measuring 2.6 cm short axis. Within the aerated RIGHT lung  there is a several hypermetabolic nodules. Large peripheral nodule measures 2.6 cm on image 78 with SUV max 4.0 Incidental CT findings: none ABDOMEN/PELVIS: Large aggressive infiltrative mass extends from the RIGHT kidney ventrally to the ventral peritoneal surface. Mass measures 13.9 x 11.7 by 16.5 cm and has intense peripheral metabolic activity with SUV max equal 18.6. Centrally the mass is necrotic and photopenic. There is an oblong mass between the LEFT kidney and pancreas measuring 5.8 by 2.7 cm with moderate heterogeneous metabolic activity. Image 56. Incidental CT findings: LEFT kidney appears normal. Adrenal glands are difficult to identify noncontrast exam. SKELETON: Direct invasion the LEFT lateral fifth rib is described in the chest section. No additional evidence skeletal metastasis. Hyper hypermetabolic subcutaneous nodule along the surface of the RIGHT gluteus maximus muscle with SUV max equal 5.1. Incidental CT findings:  none IMPRESSION: 1. Large aggressive RIGHT renal mass with intense peripheral metabolic activity consistent with primary renal carcinoma. Mass extends to the porta hepatis and ventral peritoneal surface. 2. Extensive thoracic metastasis with LEFT lung lobe metastasis as well as nodular pleural metastasis in LEFT pleural space. Pleural metastasis directly invade the LEFT fifth rib. 3. Very large LEFT pleural effusion with passive atelectasis of the LEFT lung. 4. Nodular metastasis to the RIGHT lung. 5. Bulky mediastinal paratracheal metastatic adenopathy. 6. Oblong mass in the LEFT suprarenal space of unclear etiology but favored malignant. 7. Small subcutaneous nodule metastasis superficial to the RIGHT gluteus muscle. Electronically Signed   By: Suzy Bouchard M.D.   On: 01/18/2021 14:53   DG Chest Port 1 View  Result Date: 01/20/2021 CLINICAL DATA:  Patient receiving chemotherapy. Short of breath and weakness for 3 days. EXAM: PORTABLE CHEST 1 VIEW COMPARISON:  01/02/2021 and  older studies. FINDINGS: Near complete opacification of the left hemithorax. Only a small portion of the left upper lobe is aerated. No significant mediastinal shift. Ill-defined right lung nodules, largest superimposed over the minor fissure, stable. No acute findings in the right lung. No right pleural effusion. No evidence of a pneumothorax. Skeletal structures are grossly intact. IMPRESSION: 1. Near complete opacification of the left hemithorax. This is most likely due to an increase in the left pleural effusion with increased compressive atelectasis. 2. No other change from the prior study. Ill-defined lung nodules noted consistent metastatic disease. Electronically Signed   By: Lajean Manes M.D.   On: 01/20/2021 19:58   US Thoracentesis Asp Pleural space w/IMG guide  Result Date: 01/02/2021 INDICATION: Patient with history of pulmonary nodules, right renal mass, dyspnea, left pleural effusion. Request received for diagnostic and therapeutic left thoracentesis. EXAM: ULTRASOUND GUIDED DIAGNOSTIC AND THERAPEUTIC LEFT THORACENTESIS MEDICATIONS: 1% lidocaine to skin and subcutaneous tissue COMPLICATIONS: None immediate. PROCEDURE: An ultrasound guided thoracentesis was thoroughly discussed with the patient and questions answered. The benefits, risks, alternatives and complications were also discussed. The patient understands and wishes to proceed with the procedure. Written consent was obtained. Ultrasound was performed to localize and mark an adequate pocket of fluid in the left chest. The area was then prepped and draped in the normal sterile fashion. 1% Lidocaine was used for local anesthesia. Under ultrasound guidance a 6 Fr Safe-T-Centesis catheter was introduced. Thoracentesis was performed. The catheter was removed and a dressing applied. FINDINGS: A total of approximately 1.7 liters of bloody fluid was removed. Samples were sent to the laboratory as requested by the clinical team. IMPRESSION:  Successful ultrasound guided diagnostic and therapeutic left thoracentesis yielding 1.7 liters of pleural fluid. Read by: Rowe Robert, PA-C Electronically Signed   By: Sandi Mariscal M.D.   On: 01/02/2021 14:37      IMPRESSION/PLAN: This is a very pleasant 50year old with metastatic disease to the brain (2 subcentimeter brain metastases).  Definitive tissue is pending but putative diagnosis is metastatic renal cell carcinoma.  I had a lengthy discussion with the patient and his partner after reviewing their MRI results with them.  We spoke about whole brain radiotherapy versus stereotactic radiosurgery to the brain. We spoke about the differing risks benefits and side effects of both of these treatments. During part of our discussion, we spoke about the hair loss, fatigue and cognitive effects that can result from whole brain radiotherapy.  Additionally, we spoke about radionecrosis that can result from stereotactic radiosurgery. I explained that whole brain radiotherapy is more comprehensive and  therefore can decrease the chance of recurrences elsewhere in the brain, while stereotactic radiosurgery only treats the areas of gross disease while sparing the rest of the brain parenchyma.  After lengthy discussion, the patient would like to proceed with stereotactic brain radiosurgery to their metastatic disease. They will meet with neurosurgery in the near future to discuss this further; a neurosurgeon will participate in their case.  CT simulation will take place on today and treatment on next week.  I plan to deliver 20 Gy in 1 fraction to both metastases.  Patient requests medical attention for his shortness of breath and associated chest discomfort in the setting of recurrent pleural effusion.  Pulmonology consultation ordered.  He states he is interested in repeat thoracentesis, inhalers and/or home oxygen if appropriate.  Referrals have been made to spiritual care and social work as the patient and his  partner report emotional distress.   I provided emotional support and encouragement to both of them today and they expressed appreciation.  On date of service, in total, I spent 60 minutes on this encounter. Patient was seen in person.   __________________________________________   Eppie Gick, MD  This document serves as a record of services personally performed by Eppie Ivens, MD. It was created on her behalf by Roney Mans, a trained medical scribe. The creation of this record is based on the scribe's personal observations and the provider's statements to them. This document has been checked and approved by the attending provider.

## 2021-01-23 ENCOUNTER — Ambulatory Visit
Admission: RE | Admit: 2021-01-23 | Discharge: 2021-01-23 | Disposition: A | Discharge status: Home / Self Care | Payer: PRIVATE HEALTH INSURANCE | Source: Ambulatory Visit | Attending: Radiation Oncology | Admitting: Radiation Oncology

## 2021-01-23 ENCOUNTER — Telehealth (HOSPITAL_COMMUNITY): Payer: Self-pay

## 2021-01-23 ENCOUNTER — Other Ambulatory Visit: Payer: Self-pay | Admitting: *Deleted

## 2021-01-23 ENCOUNTER — Encounter (HOSPITAL_COMMUNITY): Payer: Self-pay

## 2021-01-23 ENCOUNTER — Encounter: Payer: Self-pay | Admitting: Radiation Oncology

## 2021-01-23 ENCOUNTER — Other Ambulatory Visit: Payer: Self-pay

## 2021-01-23 ENCOUNTER — Telehealth: Payer: Self-pay | Admitting: *Deleted

## 2021-01-23 VITALS — BP 156/93 | HR 110 | Resp 20 | Ht 69.0 in | Wt 194.2 lb

## 2021-01-23 DIAGNOSIS — C782 Secondary malignant neoplasm of pleura: Secondary | ICD-10-CM | POA: Insufficient documentation

## 2021-01-23 DIAGNOSIS — C641 Malignant neoplasm of right kidney, except renal pelvis: Secondary | ICD-10-CM

## 2021-01-23 DIAGNOSIS — R112 Nausea with vomiting, unspecified: Secondary | ICD-10-CM | POA: Insufficient documentation

## 2021-01-23 DIAGNOSIS — J90 Pleural effusion, not elsewhere classified: Secondary | ICD-10-CM | POA: Insufficient documentation

## 2021-01-23 DIAGNOSIS — C7931 Secondary malignant neoplasm of brain: Secondary | ICD-10-CM

## 2021-01-23 DIAGNOSIS — R630 Anorexia: Secondary | ICD-10-CM | POA: Insufficient documentation

## 2021-01-23 DIAGNOSIS — C778 Secondary and unspecified malignant neoplasm of lymph nodes of multiple regions: Secondary | ICD-10-CM | POA: Insufficient documentation

## 2021-01-23 DIAGNOSIS — Z510 Encounter for antineoplastic radiation therapy: Secondary | ICD-10-CM | POA: Diagnosis not present

## 2021-01-23 DIAGNOSIS — F17210 Nicotine dependence, cigarettes, uncomplicated: Secondary | ICD-10-CM | POA: Insufficient documentation

## 2021-01-23 HISTORY — DX: Malignant neoplasm of left kidney, except renal pelvis: C64.2

## 2021-01-23 HISTORY — DX: Secondary malignant neoplasm of unspecified lung: C78.00

## 2021-01-23 NOTE — Progress Notes (Addendum)
Has armband been applied?  Yes.    Does patient have an allergy to IV contrast dye?: No.   Has patient ever received premedication for IV contrast dye?: Yes.     Does patient take metformin?: No.  If patient does take metformin when was the last dose: does not take  Date of lab work: January 20, 2021 BUN: 8 CR: 0.7  IV site: antecubital left, condition patent and no redness  Has IV site been added to flowsheet?  Yes.    There were no vitals taken for this visit.

## 2021-01-23 NOTE — Progress Notes (Signed)
Patient Demographics  Patient Name  Dylan Good, Dylan Good Legal Sex  Male DOB  Jul 23, 1970 SSN  [id number] Address  [address] [address] Phone  [phone] (Home)  [phone] (Mobile) *Preferred*     FW: BIOPSY Received: Today Arne Cleveland, MD  Lenore Cordia Ok   CT core R renal mass  See PET --avoid necrotic center   DDH         Previous Messages    ----- Message -----  From: [name], MD  Sent: 01/22/2021   7:22 AM EDT  To: Arne Cleveland, MD  Subject: RE: BIOPSY                                     Biopsy of the kidney will be adequate. Thanks   FS  ----- Message -----  From: Arne Cleveland, MD  Sent: 01/19/2021   4:50 PM EDT  To: Anselm Jungling, MD  Subject: FW: BIOPSY                                     [place]:  Did you need a biopsy of a met (smaller, more difficult) or would a biopsy of the R renal mass be adequate?  Let me know and we can set it up.  Thanks  Arne Cleveland   IR    ----- Message -----  From: Lenore Cordia  Sent: 01/19/2021   3:11 PM EDT  To: Ir Procedure Requests  Subject: BIOPSY                                         Procedure Requested:  CT Biopsy    Reason for Procedure: Kidney mass   Provider Requesting:  Dr Alen Blew  Provider Telephone:  [phone]    Other Info:  PET Final

## 2021-01-23 NOTE — Telephone Encounter (Signed)
CALLED PATIENT TO INFORM OF [place] VISIT WITH LUNG DOCTOR- DR. IKERD ON 01-31-21- ARRIVAL TIME- 2:45 PM, ADDRESS- [address], [address], PH. NO. - [phone], SPOKE WITH PATIENT AND HE IS AWARE OF THIS APPT.

## 2021-01-24 ENCOUNTER — Other Ambulatory Visit: Payer: Self-pay | Admitting: *Deleted

## 2021-01-24 ENCOUNTER — Telehealth: Payer: Self-pay | Admitting: *Deleted

## 2021-01-24 ENCOUNTER — Encounter: Payer: Self-pay | Admitting: Radiation Oncology

## 2021-01-24 DIAGNOSIS — C7931 Secondary malignant neoplasm of brain: Secondary | ICD-10-CM | POA: Insufficient documentation

## 2021-01-24 DIAGNOSIS — R918 Other nonspecific abnormal finding of lung field: Secondary | ICD-10-CM

## 2021-01-24 DIAGNOSIS — C641 Malignant neoplasm of right kidney, except renal pelvis: Secondary | ICD-10-CM

## 2021-01-24 HISTORY — DX: Secondary malignant neoplasm of brain: C79.31

## 2021-01-24 NOTE — Telephone Encounter (Signed)
PC to patient, informed him he has appointment for thoracentesis at [facility] on Friday, 01/26/21 @ 11:30.  He needs to arrive at 11:15, patient verbalizes understanding.

## 2021-01-25 ENCOUNTER — Other Ambulatory Visit: Payer: Self-pay

## 2021-01-25 ENCOUNTER — Emergency Department (HOSPITAL_COMMUNITY)
Admission: EM | Admit: 2021-01-25 | Discharge: 2021-01-25 | Disposition: A | Discharge status: Home / Self Care | Payer: No Typology Code available for payment source | Attending: Emergency Medicine | Admitting: Emergency Medicine

## 2021-01-25 DIAGNOSIS — R0602 Shortness of breath: Secondary | ICD-10-CM | POA: Diagnosis present

## 2021-01-25 DIAGNOSIS — Z5321 Procedure and treatment not carried out due to patient leaving prior to being seen by health care provider: Secondary | ICD-10-CM | POA: Diagnosis not present

## 2021-01-25 NOTE — ED Triage Notes (Signed)
Pt reports SHOB since later part of the day and states it is hard to breath when he tries to lay down to go to sleep.  Pt has cancer and currently receives treatments.

## 2021-01-26 ENCOUNTER — Ambulatory Visit (HOSPITAL_COMMUNITY)
Admission: RE | Admit: 2021-01-26 | Discharge: 2021-01-26 | Disposition: A | Discharge status: Home / Self Care | Payer: No Typology Code available for payment source | Source: Ambulatory Visit | Attending: Oncology | Admitting: Oncology

## 2021-01-26 ENCOUNTER — Ambulatory Visit (HOSPITAL_COMMUNITY)
Admission: RE | Admit: 2021-01-26 | Discharge: 2021-01-26 | Disposition: A | Discharge status: Home / Self Care | Payer: No Typology Code available for payment source | Source: Ambulatory Visit | Attending: Student | Admitting: Student

## 2021-01-26 ENCOUNTER — Ambulatory Visit (HOSPITAL_COMMUNITY): Admission: RE | Admit: 2021-01-26 | Payer: No Typology Code available for payment source | Source: Ambulatory Visit

## 2021-01-26 DIAGNOSIS — R918 Other nonspecific abnormal finding of lung field: Secondary | ICD-10-CM | POA: Insufficient documentation

## 2021-01-26 DIAGNOSIS — J90 Pleural effusion, not elsewhere classified: Secondary | ICD-10-CM | POA: Insufficient documentation

## 2021-01-26 DIAGNOSIS — Z510 Encounter for antineoplastic radiation therapy: Secondary | ICD-10-CM | POA: Diagnosis not present

## 2021-01-26 DIAGNOSIS — Z9889 Other specified postprocedural states: Secondary | ICD-10-CM | POA: Insufficient documentation

## 2021-01-26 IMAGING — DX DG CHEST 1V
1 series · 1 of 1 positions shown · non-contrast
Comparison: Chest x-ray dated [DATE].

CLINICAL DATA: Status post left-sided thoracentesis.

EXAM:
CHEST  1 VIEW

[chest pa]
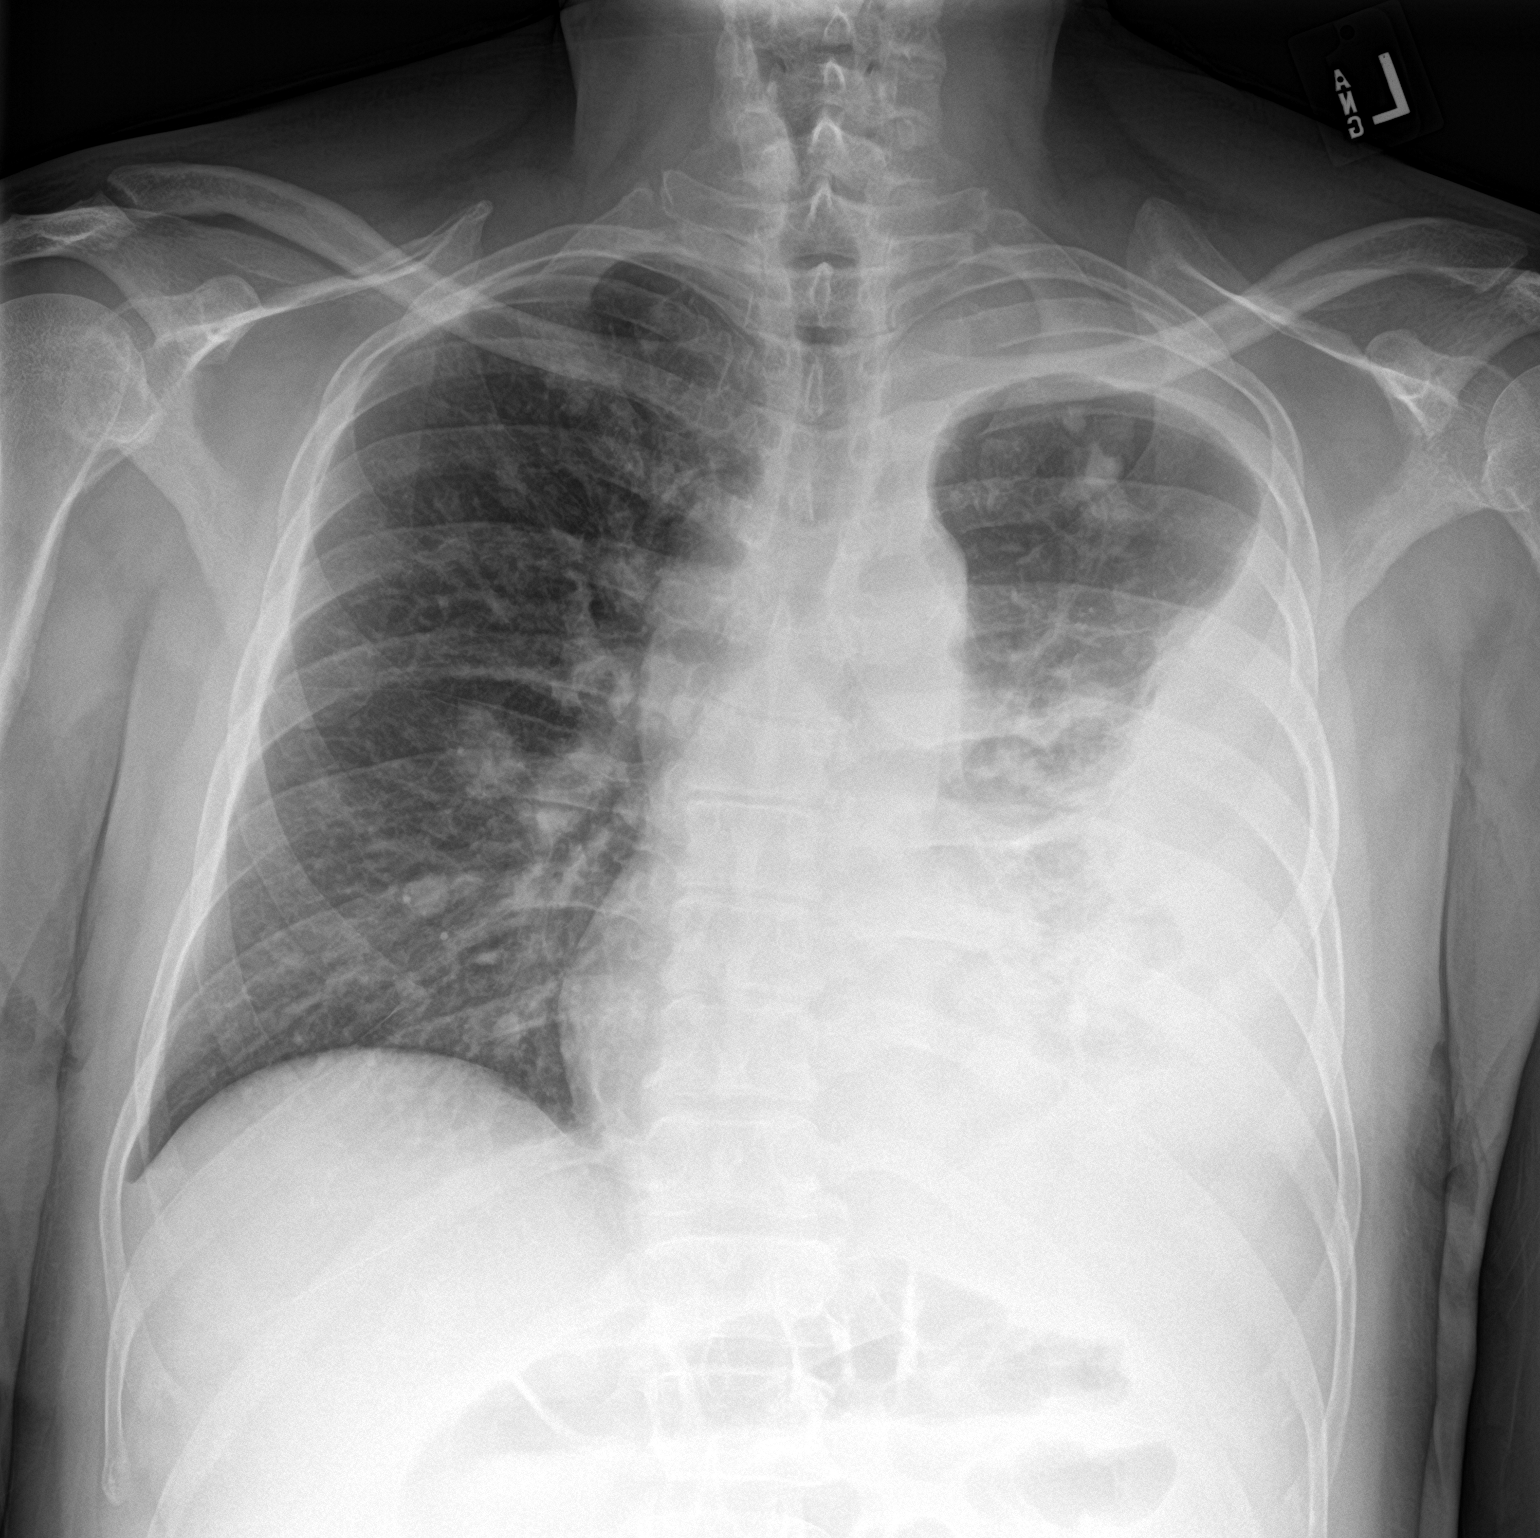

[1 of 1 positions shown; findings below may reference images not displayed]

FINDINGS: Decreased but still large left-sided pleural effusion with some
improved aeration of the left upper lobe. Continued near complete
collapse of the left lower lobe. No pneumothorax. Bilateral
pulmonary nodules are unchanged. Normal heart size. No acute osseous
abnormality.
IMPRESSION: 1. Decreased but still large left pleural effusion with some
improved aeration of the left upper lobe. No pneumothorax.

## 2021-01-26 IMAGING — US US THORACENTESIS ASP PLEURAL SPACE W/IMG GUIDE
1 series · 8 of 8 positions shown · non-contrast
Comparison: none

INDICATION: Patient with history of lung cancer, recurrent left pleural
effusion. Request made for therapeutic thoracentesis.

[Series 1: us thoracentesis asp pleural space w/img guide · 8 of 8 slices shown]
[im 1/8]
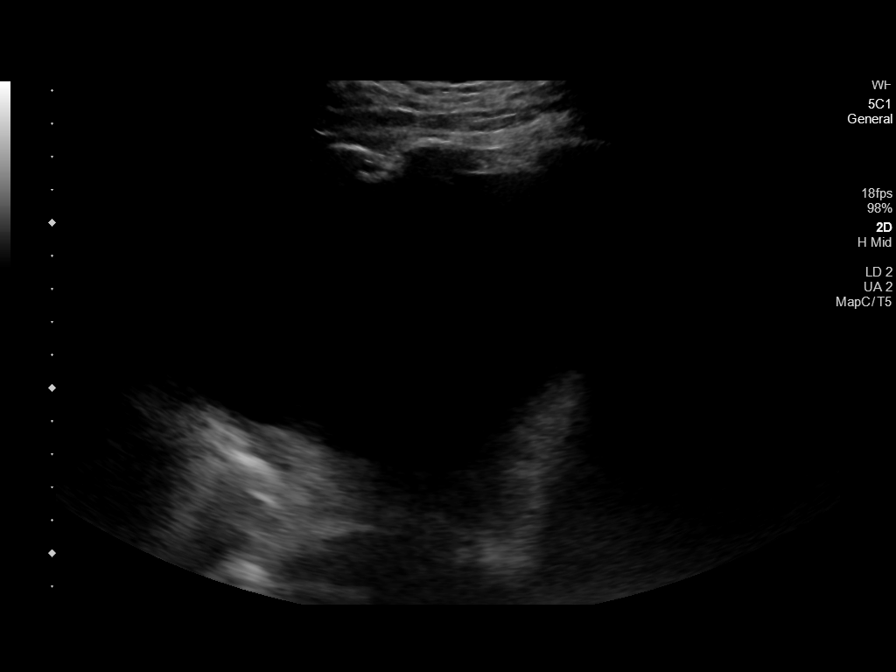
[im 2/8]
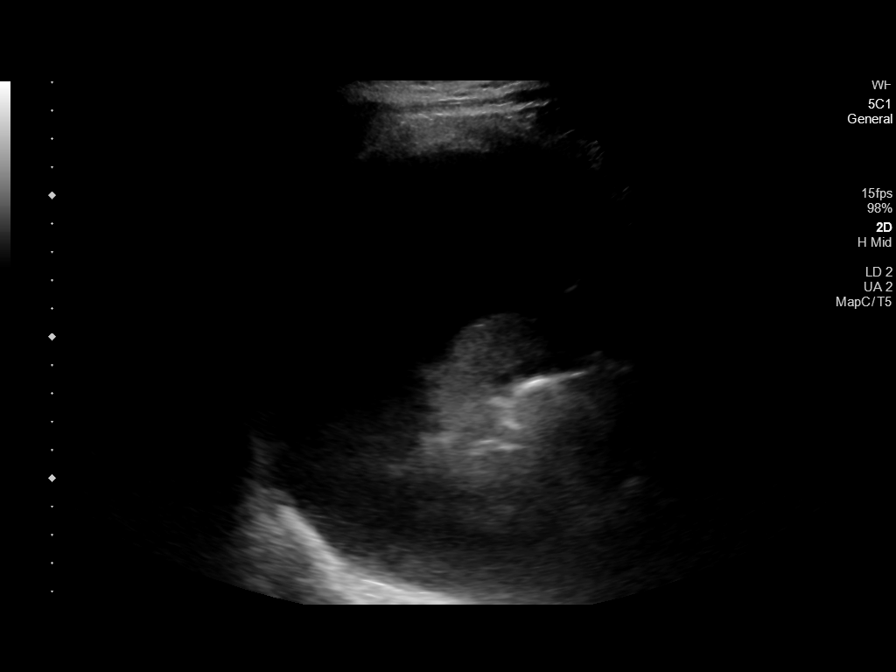
[im 3/8]
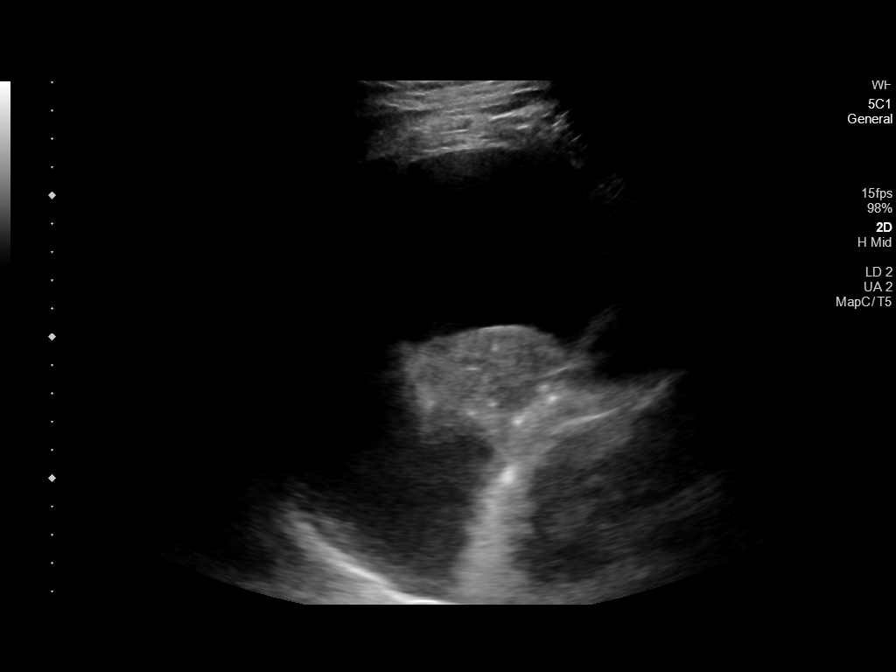
[im 4/8]
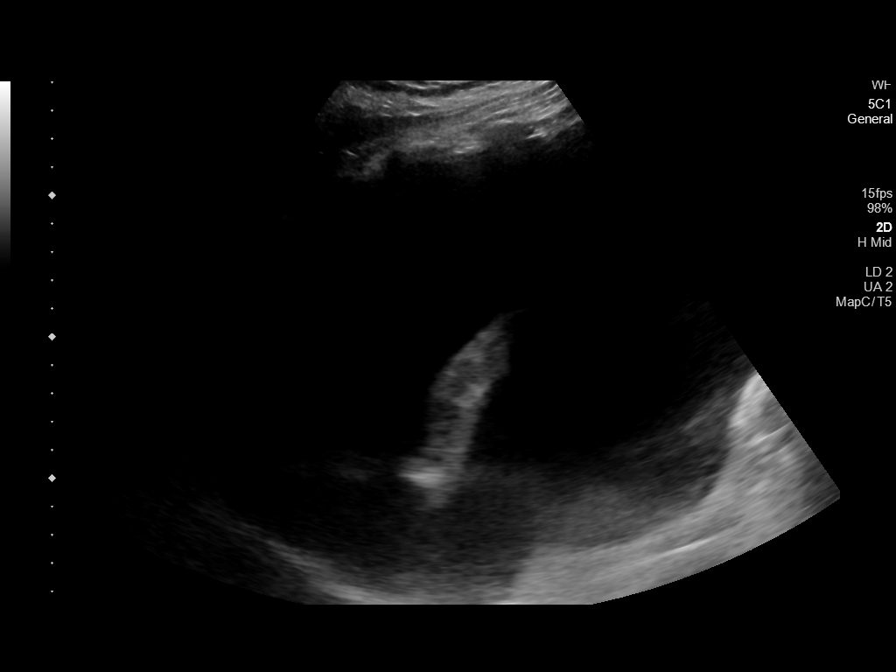
[im 5/8]
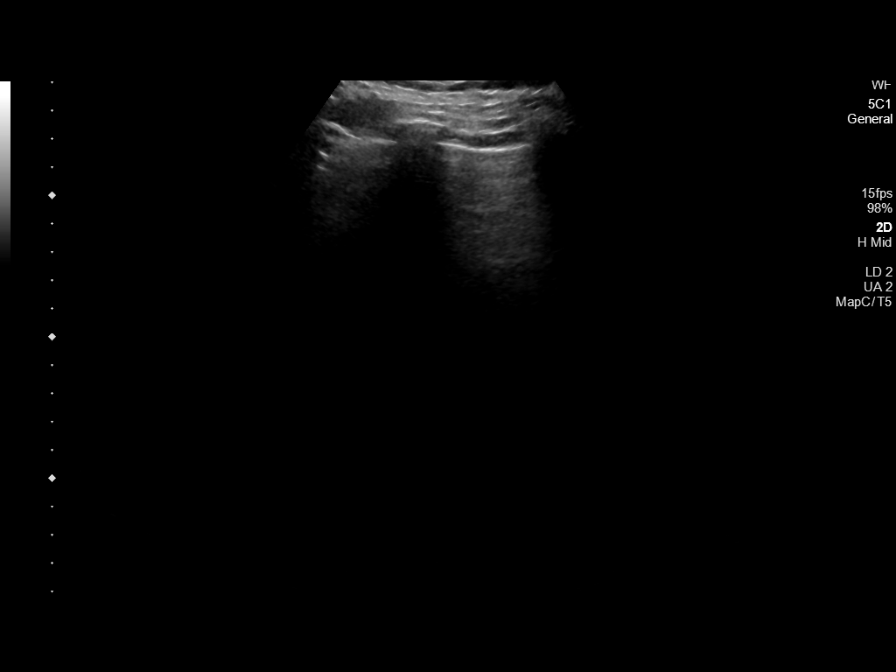
[im 6/8]
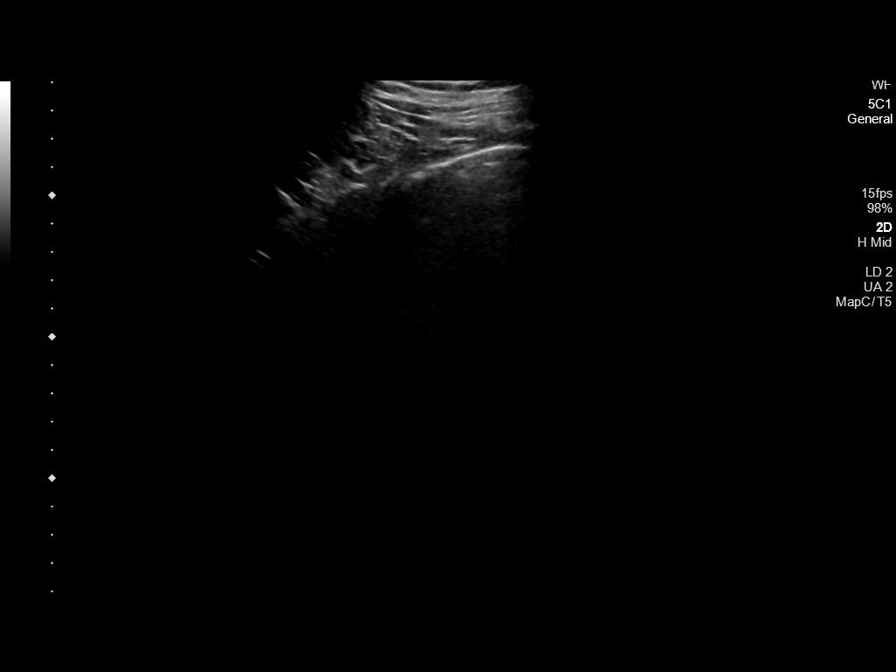
[im 7/8]
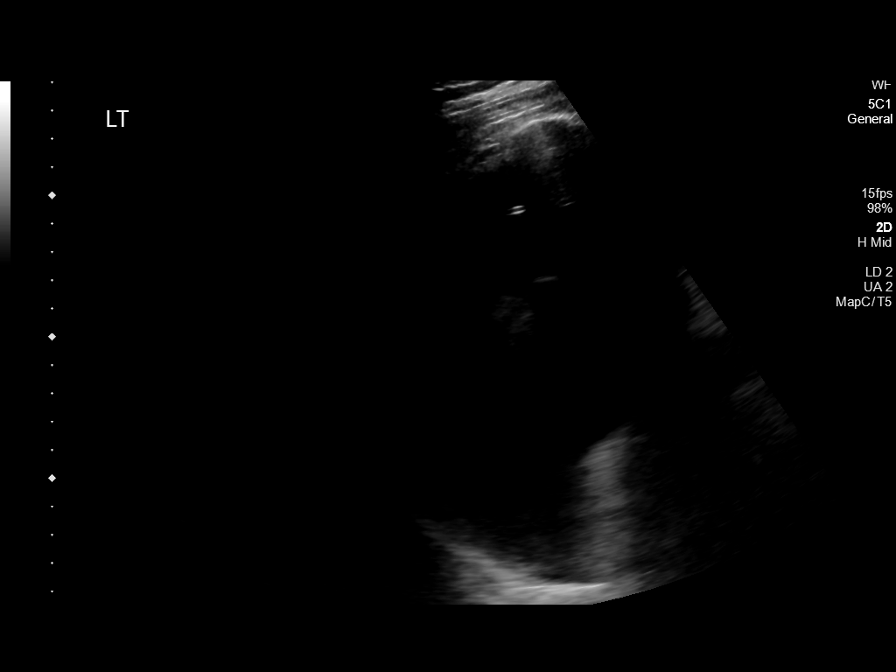
[im 8/8]
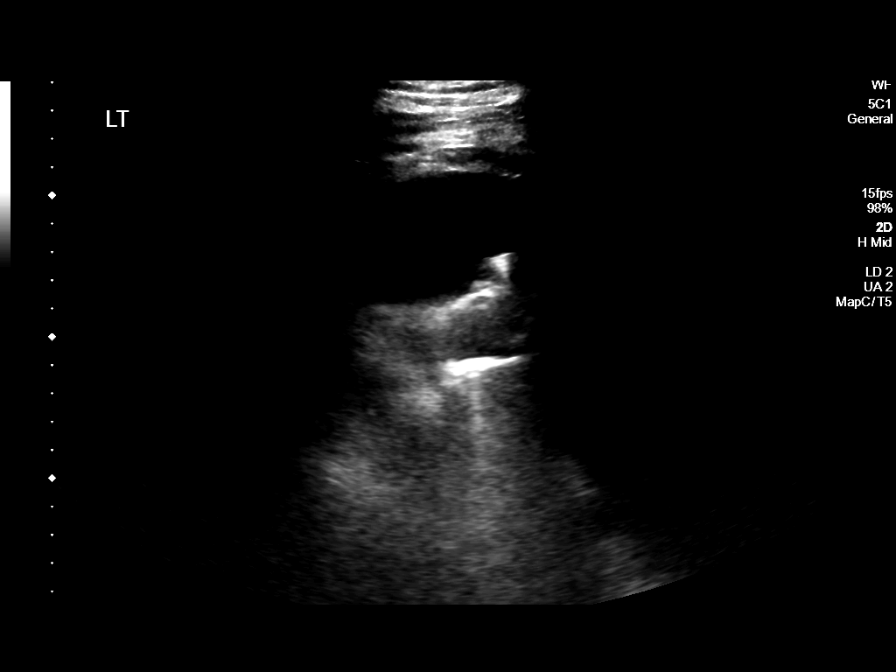

[8 of 8 positions shown; findings below may reference images not displayed]

EXAM:
ULTRASOUND GUIDED THERAPEUTIC LEFT THORACENTESIS

MEDICATIONS:
10 mL 1% lidocaine

COMPLICATIONS:
None immediate.

PROCEDURE:
An ultrasound guided thoracentesis was thoroughly discussed with the
patient and questions answered. The benefits, risks, alternatives
and complications were also discussed. The patient understands and
wishes to proceed with the procedure. Written consent was obtained.

Ultrasound was performed to localize and mark an adequate pocket of
fluid in the left chest. The area was then prepped and draped in the
normal sterile fashion. 1% Lidocaine was used for local anesthesia.
Under ultrasound guidance a 6 Fr Safe-T-Centesis catheter was
introduced. Thoracentesis was performed. The catheter was removed
and a dressing applied.
FINDINGS: A total of approximately 1.2 liters of bloody fluid was removed.
IMPRESSION: Successful ultrasound guided left thoracentesis yielding 1.2 liters
of pleural fluid.

## 2021-01-26 MED ORDER — LIDOCAINE HCL 1 % IJ SOLN
INTRAMUSCULAR | Status: AC
  Filled 2021-01-26: qty 20

## 2021-01-26 NOTE — Procedures (Signed)
PROCEDURE SUMMARY:  Successful US guided left thoracentesis. Yielded 1.2 liters of bloody fluid. Pt tolerated procedure well.  Procedure was stopped prior to removal of all fluid due to patient developing cough.  No immediate complications.  Specimen was not sent for labs. CXR ordered.  EBL < 5 mL  Docia Barrier PA-C 01/26/2021 12:22 PM

## 2021-01-29 ENCOUNTER — Ambulatory Visit
Admission: RE | Admit: 2021-01-29 | Discharge: 2021-01-29 | Disposition: A | Discharge status: Home / Self Care | Payer: PRIVATE HEALTH INSURANCE | Source: Ambulatory Visit | Attending: Radiation Oncology | Admitting: Radiation Oncology

## 2021-01-29 ENCOUNTER — Encounter: Payer: Self-pay | Admitting: Radiation Oncology

## 2021-01-29 ENCOUNTER — Inpatient Hospital Stay (HOSPITAL_BASED_OUTPATIENT_CLINIC_OR_DEPARTMENT_OTHER): Payer: PRIVATE HEALTH INSURANCE | Admitting: Internal Medicine

## 2021-01-29 ENCOUNTER — Inpatient Hospital Stay: Payer: PRIVATE HEALTH INSURANCE | Admitting: *Deleted

## 2021-01-29 ENCOUNTER — Telehealth (HOSPITAL_COMMUNITY): Payer: Self-pay

## 2021-01-29 ENCOUNTER — Other Ambulatory Visit: Payer: Self-pay

## 2021-01-29 VITALS — BP 148/86 | HR 106 | Temp 97.4°F | Resp 18 | Wt 190.4 lb

## 2021-01-29 VITALS — BP 155/83 | HR 108 | Temp 97.8°F | Resp 18

## 2021-01-29 DIAGNOSIS — C7931 Secondary malignant neoplasm of brain: Secondary | ICD-10-CM | POA: Diagnosis not present

## 2021-01-29 DIAGNOSIS — C641 Malignant neoplasm of right kidney, except renal pelvis: Secondary | ICD-10-CM

## 2021-01-29 DIAGNOSIS — Z510 Encounter for antineoplastic radiation therapy: Secondary | ICD-10-CM | POA: Diagnosis not present

## 2021-01-29 NOTE — Progress Notes (Signed)
Nurse monitoring complete status post 1 of 1 SRS treatments. Patient without complaints. Patient denies new or worsening neurologic symptoms. Vitals stable. Instructed patient to avoid strenuous activity for the next 24 hours. Instructed patient to call [phone] with needs related to treatment after hours or over the weekend. Patient verbalized understanding. Ambulated out of clinic unassisted to girlfriend's vehicle without incident   Vitals:   01/29/21 1439  BP: (!) 155/83  Pulse: (!) 108  Resp: 18  Temp: 97.8 F (36.6 C)  SpO2: 98%

## 2021-01-29 NOTE — Progress Notes (Signed)
Dylan Good at [address], [place] [address] [phone]   New Patient Evaluation  Date of Service: 01/29/21 Patient Name: Dylan Good Patient MRN: [id number] Patient DOB: [birth year] Provider: Ventura Sellers, MD  Identifying Statement:  [name] is a 50 y.o. 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  Virgina Good presented to neurologic attention earlier this past month, following an abnormal screening brain study for metastatic Kidney primary.  Two lesions consistent with brain metastases were identified.  He denied headaches, seizures, balance issues, speech problems.  Still not experiencing focal neurologic complaints today, now having completed first cycle of dual immunotherapy with [name].  Plans to undergo radiosurgery today with Dr. Isidore Moos, [facility] was completed last week.     Medications: Current Outpatient Medications on File Prior to Visit  Medication Sig Dispense Refill   HYDROcodone bit-homatropine  (HYCODAN) 5-1.5 MG/5ML syrup Take 5 mLs by mouth every 6 (six) hours as needed for cough. (Patient not taking: Reported on 01/29/2021) 120 mL 0   prochlorperazine (COMPAZINE) 10 MG tablet Take 1 tablet (10 mg total) by mouth every 6 (six) hours as needed for nausea or vomiting. (Patient not taking: Reported on 01/29/2021) 30 tablet 0   No current facility-administered medications on file prior to visit.    Allergies: No Known Allergies Past Medical History:  Past Medical History:  Diagnosis Date   Cancer, metastatic to lung ([facility])    Renal cell adenocarcinoma, left ([facility])    Past Surgical History:  Past Surgical History:  Procedure Laterality Date   APPENDECTOMY     Social History:  Social History   Socioeconomic History   Marital status: Married    Spouse name: Not on file   Number of children: Not on file   Years of education: Not on file   Highest education level: Not on file  Occupational History   Not on file  Tobacco Use   Smoking status: Some Days    Packs/day: 0.50    Types: Cigarettes   Smokeless tobacco: Never  Substance and Sexual Activity   Alcohol use: Never   Drug use: Never   Sexual activity: Not on file  Other Topics Concern   Not on file  Social History Narrative   Not on file   Social Determinants of Health   Financial Resource Strain: Not on file  Food Insecurity: Not on file  Transportation Needs: Not on file  Physical Activity: Not on file  Stress: Not on file  Social Connections: Not on file  Intimate Partner Violence: Not  on file   Family History: No family history on file.  Review of Systems: Constitutional: Doesn't report fevers, chills or abnormal weight loss Eyes: Doesn't report blurriness of vision Ears, nose, mouth, throat, and face: Doesn't report sore throat Respiratory: Doesn't report cough, dyspnea or wheezes Cardiovascular: Doesn't report palpitation, chest discomfort  Gastrointestinal:  Doesn't report nausea, constipation,  diarrhea GU: Doesn't report incontinence Skin: Doesn't report skin rashes Neurological: Per HPI Musculoskeletal: Doesn't report joint pain Behavioral/Psych: Doesn't report anxiety  Physical Exam: Vitals:   01/29/21 1135  BP: (!) 148/86  Pulse: (!) 106  Resp: 18  Temp: (!) 97.4 F (36.3 C)  SpO2: 99%   KPS: 90. General: Alert, cooperative, pleasant, in no acute distress Head: Normal EENT: No conjunctival injection or scleral icterus.  Lungs: Resp effort normal Cardiac: Regular rate Abdomen: Non-distended abdomen Skin: No rashes cyanosis or petechiae. Extremities: No clubbing or edema  Neurologic Exam: Mental Status: Awake, alert, attentive to examiner. Oriented to self and environment. Language is fluent with intact comprehension.  Cranial Nerves: Visual acuity is grossly normal. Visual fields are full. Extra-ocular movements intact. No ptosis. Face is symmetric Motor: Tone and bulk are normal. Power is full in both arms and legs. Reflexes are symmetric, no pathologic reflexes present.  Sensory: Intact to light touch Gait: Normal.   Labs: I have reviewed the data as listed    Component Value Date/Time   NA 134 (L) 01/20/2021 1911   K 3.9 01/20/2021 1911   CL 104 01/20/2021 1911   CO2 21 (L) 01/20/2021 1911   GLUCOSE 109 (H) 01/20/2021 1911   BUN 8 01/20/2021 1911   CREATININE 0.70 01/20/2021 1911   CREATININE 0.95 01/15/2021 0814   CALCIUM 9.7 01/20/2021 1911   PROT 7.7 01/20/2021 1911   ALBUMIN 3.6 01/20/2021 1911   AST 40 01/20/2021 1911   AST 21 01/15/2021 0814   ALT 67 (H) 01/20/2021 1911   ALT 42 01/15/2021 0814   ALKPHOS 84 01/20/2021 1911   BILITOT 0.9 01/20/2021 1911   BILITOT 0.7 01/15/2021 0814   GFRNONAA >60 01/20/2021 1911   GFRNONAA >60 01/15/2021 0814   Lab Results  Component Value Date   WBC 5.9 01/20/2021   NEUTROABS 3.6 01/15/2021   HGB 11.3 (L) 01/20/2021   HCT 35.0 (L) 01/20/2021   MCV 75.6 (L) 01/20/2021   PLT 332 01/20/2021     Imaging:  DG Chest 1 View  Result Date: 01/26/2021 CLINICAL DATA:  Status post left-sided thoracentesis. EXAM: CHEST  1 VIEW COMPARISON:  Chest x-ray dated January 20, 2021. FINDINGS: Decreased but still large left-sided pleural effusion with some improved aeration of the left upper lobe. Continued near complete collapse of the left lower lobe. No pneumothorax. Bilateral pulmonary nodules are unchanged. Normal heart size. No acute osseous abnormality. IMPRESSION: 1. Decreased but still large left pleural effusion with some improved aeration of the left upper lobe. No pneumothorax. Electronically Signed   By: Titus Dubin M.D.   On: 01/26/2021 13:03   DG Chest 1 View  Result Date: 01/02/2021 CLINICAL DATA:  Status post left thoracentesis EXAM: CHEST  1 VIEW COMPARISON:  12/20/2020 FINDINGS: Mild cardiomegaly. Interval reduction in volume of a left pleural effusion, now small, with residual associated atelectasis or consolidation. Numerous bilateral pulmonary nodules not significantly changed in radiographic appearance. IMPRESSION: 1. Interval reduction in volume of a left pleural effusion, now small, with residual associated atelectasis or consolidation. No pneumothorax. 2. Numerous bilateral pulmonary nodules not significantly changed in radiographic  appearance. Electronically Signed   By: Eddie Candle M.D.   On: 01/02/2021 15:13   DG Chest 2 View  Result Date: 01/20/2021 CLINICAL DATA:  Post thoracentesis EXAM: CHEST - 2 VIEW COMPARISON:  Chest radiograph dated 01/20/2021 at 1929 hours. CT chest dated 12/20/2020. FINDINGS: No pneumothorax following left thoracentesis. Near complete opacification of left hemithorax, reflecting a large left pleural effusion, with associated left lung collapse. Scattered right lung nodules, corresponding to the patient's known pulmonary metastases. The heart is normal in size, partially obscured. IMPRESSION: No pneumothorax following left thoracentesis. Otherwise  unchanged. Electronically Signed   By: Julian Hy M.D.   On: 01/20/2021 22:44   MR Brain W Wo Contrast  Result Date: 01/21/2021 CLINICAL DATA:  Staging brain metastases.  Kidney cancer. EXAM: MRI HEAD WITHOUT AND WITH CONTRAST TECHNIQUE: Multiplanar, multiecho pulse sequences of the brain and surrounding structures were obtained without and with intravenous contrast. CONTRAST:  51mL MULTIHANCE GADOBENATE DIMEGLUMINE 529 MG/ML IV SOLN COMPARISON:  None. FINDINGS: Brain: 5 mm enhancing mass in the posterior left temporal lobe with mild edema. Punctate (2 mm) subcortical enhancing mass in the medial right temporal lobe. No incidental infarct, hemorrhage, hydrocephalus, or collection. Small vessel ischemic type change in the hemispheric white matter that is mild but unexpected for age Vascular: Preserved flow voids and vascular enhancements. Tiny developmental venous anomalies in the upper and rightward cerebellum. Skull and upper cervical spine: Small sclerotic focus in the posterior left parietal bone which is nonenhancing and benign-appearing. Sinuses/Orbits: Negative Other: Prominent right suboccipital lymph node which is not hypermetabolic by PET CT. IMPRESSION: Two cerebral metastases measuring 5 mm in the left temporal lobe and 2 mm in the right temporal lobe. Electronically Signed   By: Monte Fantasia M.D.   On: 01/21/2021 11:38   MR Brain W Wo Contrast  Result Date: 01/11/2021 CLINICAL DATA:  Urologic cancer. Thoracic metastases and suspected right renal mass. EXAM: MRI HEAD WITHOUT AND WITH CONTRAST TECHNIQUE: Multiplanar, multiecho pulse sequences of the brain and surrounding structures were obtained without and with intravenous contrast. CONTRAST:  82mL GADAVIST GADOBUTROL 1 MMOL/ML IV SOLN COMPARISON:  None. FINDINGS: Brain: There is a 5 mm solidly enhancing lesion in the posterior aspect of the left superior temporal gyrus with mild surrounding edema and a small amount of susceptibility  artifact which may reflect chronic blood products. No second enhancing intracranial lesion is identified. There are multiple small foci of T2 FLAIR hyperintensity scattered throughout the cerebral white matter bilaterally with most notable involvement of the periventricular white matter including an 8 mm focus adjacent to the right frontal horn. White matter disease is overall mild in severity but is abnormal for age. No acute infarct, midline shift, or extra-axial fluid collection is identified. Cerebral volume is borderline low for age. Vascular: Major intracranial vascular flow voids are preserved. Skull and upper cervical spine: No suspicious marrow lesion. Sinuses/Orbits: Unremarkable orbits. Paranasal sinuses and mastoid air cells are clear. Other: None. IMPRESSION: 1. 5 mm enhancing left temporal lobe lesion with mild edema most compatible with a solitary metastasis. 2. Mild cerebral white matter T2 signal changes, nonspecific. Considerations include early chronic small vessel ischemia, migraines, prior infection/inflammation, and demyelinating disease. Electronically Signed   By: Logan Bores M.D.   On: 01/11/2021 14:36   NM PET Image Initial (PI) Skull Base To Thigh  Result Date: 01/18/2021 CLINICAL DATA:  Initial treatment strategy for urologic carcinoma. EXAM: NUCLEAR MEDICINE PET SKULL BASE TO THIGH TECHNIQUE: 10.4 mCi F-18 FDG  was injected intravenously. Full-ring PET imaging was performed from the skull base to thigh after the radiotracer. CT data was obtained and used for attenuation correction and anatomic localization. Fasting blood glucose: 112 mg/dl COMPARISON:  Chest CT 12/20/2020 FINDINGS: Mediastinal blood pool activity: SUV max 2.4 Liver activity: SUV max NA NECK: No hypermetabolic lymph nodes in the neck. Incidental CT findings: none CHEST: There is a large LEFT pleural effusion occupying the near entirety of the LEFT hemithorax. Only a small volume of the LEFT upper lobe is aerated. The  LEFT lower lobe and large portion of the LEFT upper lobe are collapsed centrally around the hilum. Within this collapsed lower lobe, hypermetabolic focus with SUV max equal 8.9 (image 89). Along the lateral pleural surface of the LEFT lower hemithorax there is nodular soft tissue with intense metabolic activity measuring 8 cm x 2 cm with SUV max 11.9. A similar finding in the lateral mid LEFT hemithorax with SUV max equal 6.3. This nodular pleural thickening erodes the adjacent fifth rib (image 76) There is a large hypermetabolic RIGHT lower paratracheal node which is bulky measuring 2.6 cm short axis. Within the aerated RIGHT lung there is a several hypermetabolic nodules. Large peripheral nodule measures 2.6 cm on image 78 with SUV max 4.0 Incidental CT findings: none ABDOMEN/PELVIS: Large aggressive infiltrative mass extends from the RIGHT kidney ventrally to the ventral peritoneal surface. Mass measures 13.9 x 11.7 by 16.5 cm and has intense peripheral metabolic activity with SUV max equal 18.6. Centrally the mass is necrotic and photopenic. There is an oblong mass between the LEFT kidney and pancreas measuring 5.8 by 2.7 cm with moderate heterogeneous metabolic activity. Image 56. Incidental CT findings: LEFT kidney appears normal. Adrenal glands are difficult to identify noncontrast exam. SKELETON: Direct invasion the LEFT lateral fifth rib is described in the chest section. No additional evidence skeletal metastasis. Hyper hypermetabolic subcutaneous nodule along the surface of the RIGHT gluteus maximus muscle with SUV max equal 5.1. Incidental CT findings: none IMPRESSION: 1. Large aggressive RIGHT renal mass with intense peripheral metabolic activity consistent with primary renal carcinoma. Mass extends to the porta hepatis and ventral peritoneal surface. 2. Extensive thoracic metastasis with LEFT lung lobe metastasis as well as nodular pleural metastasis in LEFT pleural space. Pleural metastasis directly  invade the LEFT fifth rib. 3. Very large LEFT pleural effusion with passive atelectasis of the LEFT lung. 4. Nodular metastasis to the RIGHT lung. 5. Bulky mediastinal paratracheal metastatic adenopathy. 6. Oblong mass in the LEFT suprarenal space of unclear etiology but favored malignant. 7. Small subcutaneous nodule metastasis superficial to the RIGHT gluteus muscle. Electronically Signed   By: Suzy Bouchard M.D.   On: 01/18/2021 14:53   DG Chest Port 1 View  Result Date: 01/20/2021 CLINICAL DATA:  Patient receiving chemotherapy. Short of breath and weakness for 3 days. EXAM: PORTABLE CHEST 1 VIEW COMPARISON:  01/02/2021 and older studies. FINDINGS: Near complete opacification of the left hemithorax. Only a small portion of the left upper lobe is aerated. No significant mediastinal shift. Ill-defined right lung nodules, largest superimposed over the minor fissure, stable. No acute findings in the right lung. No right pleural effusion. No evidence of a pneumothorax. Skeletal structures are grossly intact. IMPRESSION: 1. Near complete opacification of the left hemithorax. This is most likely due to an increase in the left pleural effusion with increased compressive atelectasis. 2. No other change from the prior study. Ill-defined lung nodules noted consistent metastatic disease. Electronically Signed  By: Lajean Manes M.D.   On: 01/20/2021 19:58   US Thoracentesis Asp Pleural space w/IMG guide  Result Date: 01/26/2021 INDICATION: Patient with history of lung cancer, recurrent left pleural effusion. Request made for therapeutic thoracentesis. EXAM: ULTRASOUND GUIDED THERAPEUTIC LEFT THORACENTESIS MEDICATIONS: 10 mL 1% lidocaine COMPLICATIONS: None immediate. PROCEDURE: An ultrasound guided thoracentesis was thoroughly discussed with the patient and questions answered. The benefits, risks, alternatives and complications were also discussed. The patient understands and wishes to proceed with the procedure.  Written consent was obtained. Ultrasound was performed to localize and mark an adequate pocket of fluid in the left chest. The area was then prepped and draped in the normal sterile fashion. 1% Lidocaine was used for local anesthesia. Under ultrasound guidance a 6 Fr Safe-T-Centesis catheter was introduced. Thoracentesis was performed. The catheter was removed and a dressing applied. FINDINGS: A total of approximately 1.2 liters of bloody fluid was removed. IMPRESSION: Successful ultrasound guided left thoracentesis yielding 1.2 liters of pleural fluid. Read by: Brynda Greathouse PA-C Electronically Signed   By: Jacqulynn Cadet M.D.   On: 01/26/2021 15:42   US Thoracentesis Asp Pleural space w/IMG guide  Result Date: 01/02/2021 INDICATION: Patient with history of pulmonary nodules, right renal mass, dyspnea, left pleural effusion. Request received for diagnostic and therapeutic left thoracentesis. EXAM: ULTRASOUND GUIDED DIAGNOSTIC AND THERAPEUTIC LEFT THORACENTESIS MEDICATIONS: 1% lidocaine to skin and subcutaneous tissue COMPLICATIONS: None immediate. PROCEDURE: An ultrasound guided thoracentesis was thoroughly discussed with the patient and questions answered. The benefits, risks, alternatives and complications were also discussed. The patient understands and wishes to proceed with the procedure. Written consent was obtained. Ultrasound was performed to localize and mark an adequate pocket of fluid in the left chest. The area was then prepped and draped in the normal sterile fashion. 1% Lidocaine was used for local anesthesia. Under ultrasound guidance a 6 Fr Safe-T-Centesis catheter was introduced. Thoracentesis was performed. The catheter was removed and a dressing applied. FINDINGS: A total of approximately 1.7 liters of bloody fluid was removed. Samples were sent to the laboratory as requested by the clinical team. IMPRESSION: Successful ultrasound guided diagnostic and therapeutic left thoracentesis  yielding 1.7 liters of pleural fluid. Read by: Rowe Robert, PA-C Electronically Signed   By: Sandi Mariscal M.D.   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) [C64.1]  Virgina Good presents today with 2 novel metastatic foci, both located supratentorially.  Primary is kidney with clear cell histology, not typical renal cell carcinoma.  He is undergoing immunotherapy (dual) with [name].  We discussed benefits of radiosurgery in this case over whole brain irradiation.  He is agreeable to this and will undergo treatment later today.  No corticosteroids or anti-epileptics necessary at this time.   We spent twenty additional minutes teaching regarding the natural history, biology, and historical experience in the treatment of neurologic complications of cancer.   We appreciate the opportunity to participate in the care of [employer].  He can follow up with [place] with any neurologic symptoms, or CNS progression of disease.  All questions were answered. The patient knows to call the clinic with any problems, questions or concerns. No barriers to learning were detected.  The total time spent in the encounter was 40 minutes and more than 50% was on counseling and review of test results   Ventura Sellers, MD Medical Director of Neuro-Oncology [facility]  [place] at [facility] 01/29/21 2:28 PM

## 2021-01-29 NOTE — Op Note (Signed)
Name: Dylan Good    MRN: [id number]   Date: 01/29/2021    DOB: 26-Jul-1970   STEREOTACTIC RADIOSURGERY OPERATIVE NOTE  PRE-OPERATIVE DIAGNOSIS:  Metastatic brain disease  POST-OPERATIVE DIAGNOSIS:  Metastatic brain disease  PROCEDURE:   Stereotactic Radiosurgery using TrueBeam Linac device to left temporal lobe lesion, simple Stereotactic Radiosurgery using TrueBeam Linac device to right medial temporal lobe lesion, simple  SURGEON:  Duffy Rhody, MD  RADIATION ONCOLOGIST: Eppie Tiley, MD  TECHNIQUE:  The patient underwent a radiation treatment planning session in the radiation oncology simulation suite under the care of the radiation oncology physician and physicist.  I participated closely in the radiation treatment planning afterwards. The patient underwent planning CT which was fused to 3T high resolution MRI with 1 mm axial slices.  These images were fused on the planning system.  We contoured the gross target volumes and subsequently expanded these by 1 mm to yield the Planning Target Volume. I actively participated in the planning process.  I helped to define and review the target contours and also the contours of the optic pathway, eyes, brainstem and selected nearby organs at risk.  All the dose constraints for critical structures were reviewed and compared to AAPM Task Group 101.  The prescription dose conformity was reviewed.  I approved the plan electronically.    Accordingly, Dylan Good  was brought to the TrueBeam stereotactic radiation treatment linac and placed in the custom immobilization mask.  The patient was aligned according to the IR fiducial markers with BrainLab Exactrac, then orthogonal x-rays were used in ExacTrac with the 6DOF robotic table and the shifts were made to align the patient  Dylan Good received stereotactic radiosurgery to a prescription dose of 20 Gy uneventfully to each of the lesions  The detailed description of the procedure is recorded  in the radiation oncology procedure note.  I was present for the duration of the procedure.  DISPOSITION:   Following delivery, the patient was transported to nursing in stable condition and monitored for possible acute effects to be discharged to home in stable condition with follow-up in one month.  Duffy Rhody, MD [facility]

## 2021-01-30 ENCOUNTER — Encounter: Payer: Self-pay | Admitting: *Deleted

## 2021-01-30 ENCOUNTER — Telehealth (HOSPITAL_COMMUNITY): Payer: Self-pay

## 2021-01-30 NOTE — Progress Notes (Signed)
[facility] Psychosocial Distress Screening Clinical Social Work  Clinical Social Work was referred by distress screening protocol.  The patient scored a 7 on the Psychosocial Distress Thermometer which indicates moderate distress. Clinical Social Worker contacted patient by phone to assess for distress and other psychosocial needs. Patient had spoken with patient prior to this referral. Dylan Good shared he has no questions or emotional concerns after meeting with medical team. CSW encouraged patient to follow up as needed.  ONCBCN DISTRESS SCREENING 01/23/2021  Screening Type Initial Screening  Distress experienced in past week (1-10) 7  Emotional problem type Depression;Nervousness/Anxiety  Information Concerns Type Lack of info about diagnosis  Physician notified of physical symptoms No  Referral to clinical psychology No  Referral to clinical social work Yes  Referral to dietition No  Referral to financial advocate No  Referral to support programs No  Referral to palliative care No    Clinical Social Worker follow up needed: No.  If yes, follow up plan:  Gwinda Maine, LCSW

## 2021-01-30 NOTE — Progress Notes (Signed)
  Radiation Oncology         [phone] ________________________________  Name: Dylan Good MRN: [id number]  Date: 01/29/2021  DOB: [birth year]  Stereotactic Treatment Procedure Note  SPECIAL TREATMENT PROCEDURE  Outpatient    ICD-10-CM   1. Brain metastases ([place])  C79.31       3D TREATMENT PLANNING AND DOSIMETRY:  The patient's radiation plan was reviewed and approved by neurosurgery and radiation oncology prior to treatment.  It showed 3-dimensional radiation distributions overlaid onto the planning CT/MRI image set.  The [facility] for the target structures as well as the organs at risk were reviewed. The documentation of the 3D plan and dosimetry are filed in the radiation oncology EMR.  NARRATIVE:  Dylan Good was brought to the TrueBeam stereotactic radiation treatment machine and placed supine on the CT couch. The head frame was applied, and the patient was set up for stereotactic radiosurgery.  Neurosurgery was present for the set-up and delivery  SIMULATION VERIFICATION:  In the couch zero-angle position, the patient underwent Exactrac imaging using the Brainlab system with orthogonal KV images.  These were carefully aligned and repeated to confirm treatment position for each of the isocenters.  The Exactrac snap film verification was repeated at each couch angle.  SPECIAL TREATMENT PROCEDURE: Dylan Good received stereotactic radiosurgery to the following targets: Left temporal 26mm target and right temporal 2 mm target were treated to 20Gy in 1 fraction with SRS / VMAT technique using 6 MV photons, flattening filter free.  This constitutes a special treatment procedure due to the ablative dose delivered and the technical nature of treatment.  This highly technical modality of treatment ensures that the ablative dose is centered on the patient's tumor while sparing normal tissues from excessive dose and risk of detrimental effects.  STEREOTACTIC TREATMENT MANAGEMENT:   Following delivery, the patient was transported to nursing in stable condition and monitored for possible acute effects.  Vital signs were recorded BP (!) 155/83 (BP Location: Left Arm, Patient Position: Sitting, Cuff Size: Large)   Pulse (!) 108   Temp 97.8 F (36.6 C) (Tympanic)   Resp 18   SpO2 98% . The patient tolerated treatment without significant acute effects, and was discharged to home in stable condition.    PLAN: Follow-up in one month.  ________________________________   Eppie Gielow, MD

## 2021-01-31 ENCOUNTER — Ambulatory Visit: Payer: No Typology Code available for payment source | Admitting: Pulmonary Disease

## 2021-01-31 ENCOUNTER — Encounter: Payer: Self-pay | Admitting: Oncology

## 2021-01-31 ENCOUNTER — Other Ambulatory Visit: Payer: Self-pay

## 2021-01-31 VITALS — BP 150/90 | HR 120 | Ht 69.0 in | Wt 188.8 lb

## 2021-01-31 DIAGNOSIS — C7931 Secondary malignant neoplasm of brain: Secondary | ICD-10-CM

## 2021-01-31 DIAGNOSIS — J90 Pleural effusion, not elsewhere classified: Secondary | ICD-10-CM | POA: Diagnosis not present

## 2021-01-31 DIAGNOSIS — C641 Malignant neoplasm of right kidney, except renal pelvis: Secondary | ICD-10-CM | POA: Diagnosis not present

## 2021-01-31 NOTE — Patient Instructions (Signed)
Thank you for visiting Dr. Valeta Harms at [facility]. Today we recommend the following:  Orders Placed This Encounter  Procedures   Procedural/ Surgical Case Request: INSERTION PLEURAL DRAINAGE CATHETER   Planned pleurex on 8/2, be at [facility] at 1:30PM   Return in about 3 weeks (around 02/21/2021) for with APP or Dr. Valeta Harms.     Please do your part to reduce the spread of COVID-19.

## 2021-01-31 NOTE — H&P (View-Only) (Signed)
Synopsis: Referred in July 2022 for malignant pleural effusion by Eppie Rennels, MD  Subjective:   PATIENT ID: Dylan Good DOB: 01-24-1971, MRN: [id number]  Chief Complaint  Patient presents with   Consult    Consult for pleural effusion and lung mass. Denies chest pain or SOB. Reports rarely using they hycodan. Reports a cough for about a year.     This is a 50 year old gentleman, new diagnosis of metastatic renal cell carcinoma.  He has a large left-sided pleural effusion recent thoracentesis that had improvement with his shortness of breath.  He is currently started therapy.  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  Recent thoracentesis the patient had 1.2 L of fluid removed from his left chest.  He does however feel like his fluid has returned and does feel short of breath again today in the office but is not as bad as it was.   Past Medical History:  Diagnosis Date   Cancer, metastatic to lung [facility])    Renal cell adenocarcinoma, left ([place])      No family history on file.   Past Surgical History:  Procedure Laterality Date   APPENDECTOMY      Social History   Socioeconomic History   Marital status: Married    Spouse name: Not on file   Number of children: Not on file   Years of education: Not on file   Highest education level: Not on file  Occupational History   Not on file  Tobacco Use   Smoking status: Some Days    Packs/day: 0.50    Types: Cigarettes   Smokeless tobacco: Never  Substance and Sexual Activity   Alcohol use: Never   Drug use: Never   Sexual activity: Not on file  Other Topics Concern   Not on file  Social History Narrative   Not on file   Social Determinants of Health   Financial Resource Strain: Not on file  Food Insecurity: Not on file  Transportation Needs: Not on file  Physical Activity: Not on file  Stress: Not on file  Social Connections: Not on file   Intimate Partner Violence: Not on file     No Known Allergies   Outpatient Medications Prior to Visit  Medication Sig Dispense Refill   HYDROcodone bit-homatropine (HYCODAN) 5-1.5 MG/5ML syrup Take 5 mLs by mouth every 6 (six) hours as needed for cough. 120 mL 0   prochlorperazine (COMPAZINE) 10 MG tablet Take 1 tablet (10 mg total) by mouth every 6 (six) hours as needed for nausea or vomiting. 30 tablet 0   No facility-administered medications prior to visit.    Review of Systems  Constitutional:  Negative for chills, fever, malaise/fatigue and weight loss.  HENT:  Negative for hearing loss, sore throat and tinnitus.   Eyes:  Negative for blurred vision and double vision.  Respiratory:  Positive for shortness of breath. Negative for cough, hemoptysis, sputum production, wheezing and stridor.   Cardiovascular:  Negative for chest pain, palpitations, orthopnea, leg swelling and PND.  Gastrointestinal:  Negative for abdominal pain, constipation, diarrhea, heartburn, nausea and vomiting.  Genitourinary:  Negative for dysuria, hematuria and urgency.  Musculoskeletal:  Negative for joint pain and myalgias.  Skin:  Negative for itching and rash.  Neurological:  Negative for dizziness, tingling, weakness and headaches.  Endo/Heme/Allergies:  Negative for environmental allergies. Does not bruise/bleed easily.  Psychiatric/Behavioral:  Negative for depression. The  patient is not nervous/anxious and does not have insomnia.   All other systems reviewed and are negative.   Objective:  Physical Exam Vitals reviewed.  Constitutional:      General: He is not in acute distress.    Appearance: He is well-developed.  HENT:     Head: Normocephalic and atraumatic.  Eyes:     General: No scleral icterus.    Conjunctiva/sclera: Conjunctivae normal.     Pupils: Pupils are equal, round, and reactive to light.  Neck:     Vascular: No JVD.     Trachea: No tracheal deviation.  Cardiovascular:      Rate and Rhythm: Normal rate and regular rhythm.     Heart sounds: Normal heart sounds. No murmur heard. Pulmonary:     Effort: Pulmonary effort is normal. No tachypnea, accessory muscle usage or respiratory distress.     Breath sounds: No stridor. No wheezing, rhonchi or rales.  Abdominal:     General: Bowel sounds are normal. There is no distension.     Palpations: Abdomen is soft.     Tenderness: There is no abdominal tenderness.  Musculoskeletal:        General: No tenderness.     Cervical back: Neck supple.  Lymphadenopathy:     Cervical: No cervical adenopathy.  Skin:    General: Skin is warm and dry.     Capillary Refill: Capillary refill takes less than 2 seconds.     Findings: No rash.  Neurological:     Mental Status: He is alert and oriented to person, place, and time.  Psychiatric:        Behavior: Behavior normal.     Vitals:   01/31/21 1505  BP: (!) 150/90  Pulse: (!) 120  SpO2: 96%  Weight: 188 lb 12.8 oz (85.6 kg)  Height: _0  (1.753 m)   96% on RA BMI Readings from Last 3 Encounters:  01/31/21 27.88 kg/m  01/29/21 28.11 kg/m  01/25/21 28.65 kg/m   Wt Readings from Last 3 Encounters:  01/31/21 188 lb 12.8 oz (85.6 kg)  01/29/21 190 lb 6 oz (86.4 kg)  01/25/21 194 lb (88 kg)     CBC    Component Value Date/Time   WBC 5.9 01/20/2021 1911   RBC 4.63 01/20/2021 1911   HGB 11.3 (L) 01/20/2021 1911   HGB 12.2 (L) 01/15/2021 0814   HCT 35.0 (L) 01/20/2021 1911   PLT 332 01/20/2021 1911   PLT 301 01/15/2021 0814   MCV 75.6 (L) 01/20/2021 1911   MCH 24.4 (L) 01/20/2021 1911   MCHC 32.3 01/20/2021 1911   RDW 14.2 01/20/2021 1911   LYMPHSABS 1.3 01/15/2021 0814   MONOABS 0.8 01/15/2021 0814   EOSABS 0.2 01/15/2021 0814   BASOSABS 0.1 01/15/2021 0814    Chest Imaging: 01/26/2021: Chest x-ray Pleural effusion improved after thoracentesis. The patient's images have been independently reviewed by me.    CT scan chest July 2022: Extensive  thoracic metastasis and large left-sided pleural effusion and pleural nodular metastasis. The patient's images have been independently reviewed by me.    Pulmonary Functions Testing Results: No flowsheet data found.  FeNO:   Pathology:   Echocardiogram:   Heart Catheterization:     Assessment & Plan:     ICD-10-CM   1. Pleural effusion on left  J90 CANCELED: Procedural/ Surgical Case Request: INSERTION PLEURAL DRAINAGE CATHETER    2. Primary malignant neoplasm of right kidney with metastasis from kidney to other site (  [place])  C64.1 CANCELED: Procedural/ Surgical Case Request: INSERTION PLEURAL DRAINAGE CATHETER    3. Brain metastases ([place])  C79.31 CANCELED: Procedural/ Surgical Case Request: INSERTION PLEURAL DRAINAGE CATHETER      Discussion:  This is a 50 year old Good, left-sided pleural effusion, malignant pleural effusion multiple brain metastasis advanced age renal cell carcinoma.  Plan: We discussed the management of malignant pleural effusion. Patient will need likely repeat thoracentesis. We also discussed utility of indwelling pleural catheter placement. I think due to the extensive his disease and how fast the fluid is reaccumulated.  I think patient would benefit from an indwelling pleural catheter. Patient is agreeable to this plan.  We will plan for IPC placement next week.    Current Outpatient Medications:    HYDROcodone bit-homatropine (HYCODAN) 5-1.5 MG/5ML syrup, Take 5 mLs by mouth every 6 (six) hours as needed for cough., Disp: 120 mL, Rfl: 0   prochlorperazine (COMPAZINE) 10 MG tablet, Take 1 tablet (10 mg total) by mouth every 6 (six) hours as needed for nausea or vomiting., Disp: 30 tablet, Rfl: 0   Garner Nash, DO [facility] 01/31/2021 3:23 PM

## 2021-01-31 NOTE — Progress Notes (Signed)
Error

## 2021-01-31 NOTE — Progress Notes (Signed)
Synopsis: Referred in July 2022 for malignant pleural effusion by Eppie Balcerzak, MD  Subjective:   PATIENT ID: Dylan Good GENDER: male DOB: 07-Feb-1971, MRN: [id number]  Chief Complaint  Patient presents with   Consult    Consult for pleural effusion and lung mass. Denies chest pain or SOB. Reports rarely using they hycodan. Reports a cough for about a year.     This is a 50 year old gentleman, new diagnosis of metastatic renal cell carcinoma.  He has a large left-sided pleural effusion recent thoracentesis that had improvement with his shortness of breath.  He is currently started therapy.  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  Recent thoracentesis the patient had 1.2 L of fluid removed from his left chest.  He does however feel like his fluid has returned and does feel short of breath again today in the office but is not as bad as it was.   Past Medical History:  Diagnosis Date   Cancer, metastatic to lung [facility])    Renal cell adenocarcinoma, left ([place])      No family history on file.   Past Surgical History:  Procedure Laterality Date   APPENDECTOMY      Social History   Socioeconomic History   Marital status: Married    Spouse name: Not on file   Number of children: Not on file   Years of education: Not on file   Highest education level: Not on file  Occupational History   Not on file  Tobacco Use   Smoking status: Some Days    Packs/day: 0.50    Types: Cigarettes   Smokeless tobacco: Never  Substance and Sexual Activity   Alcohol use: Never   Drug use: Never   Sexual activity: Not on file  Other Topics Concern   Not on file  Social History Narrative   Not on file   Social Determinants of Health   Financial Resource Strain: Not on file  Food Insecurity: Not on file  Transportation Needs: Not on file  Physical Activity: Not on file  Stress: Not on file  Social Connections: Not on file   Intimate Partner Violence: Not on file     No Known Allergies   Outpatient Medications Prior to Visit  Medication Sig Dispense Refill   HYDROcodone bit-homatropine (HYCODAN) 5-1.5 MG/5ML syrup Take 5 mLs by mouth every 6 (six) hours as needed for cough. 120 mL 0   prochlorperazine (COMPAZINE) 10 MG tablet Take 1 tablet (10 mg total) by mouth every 6 (six) hours as needed for nausea or vomiting. 30 tablet 0   No facility-administered medications prior to visit.    Review of Systems  Constitutional:  Negative for chills, fever, malaise/fatigue and weight loss.  HENT:  Negative for hearing loss, sore throat and tinnitus.   Eyes:  Negative for blurred vision and double vision.  Respiratory:  Positive for shortness of breath. Negative for cough, hemoptysis, sputum production, wheezing and stridor.   Cardiovascular:  Negative for chest pain, palpitations, orthopnea, leg swelling and PND.  Gastrointestinal:  Negative for abdominal pain, constipation, diarrhea, heartburn, nausea and vomiting.  Genitourinary:  Negative for dysuria, hematuria and urgency.  Musculoskeletal:  Negative for joint pain and myalgias.  Skin:  Negative for itching and rash.  Neurological:  Negative for dizziness, tingling, weakness and headaches.  Endo/Heme/Allergies:  Negative for environmental allergies. Does not bruise/bleed easily.  Psychiatric/Behavioral:  Negative for depression. The  patient is not nervous/anxious and does not have insomnia.   All other systems reviewed and are negative.   Objective:  Physical Exam Vitals reviewed.  Constitutional:      General: He is not in acute distress.    Appearance: He is well-developed.  HENT:     Head: Normocephalic and atraumatic.  Eyes:     General: No scleral icterus.    Conjunctiva/sclera: Conjunctivae normal.     Pupils: Pupils are equal, round, and reactive to light.  Neck:     Vascular: No JVD.     Trachea: No tracheal deviation.  Cardiovascular:      Rate and Rhythm: Normal rate and regular rhythm.     Heart sounds: Normal heart sounds. No murmur heard. Pulmonary:     Effort: Pulmonary effort is normal. No tachypnea, accessory muscle usage or respiratory distress.     Breath sounds: No stridor. No wheezing, rhonchi or rales.  Abdominal:     General: Bowel sounds are normal. There is no distension.     Palpations: Abdomen is soft.     Tenderness: There is no abdominal tenderness.  Musculoskeletal:        General: No tenderness.     Cervical back: Neck supple.  Lymphadenopathy:     Cervical: No cervical adenopathy.  Skin:    General: Skin is warm and dry.     Capillary Refill: Capillary refill takes less than 2 seconds.     Findings: No rash.  Neurological:     Mental Status: He is alert and oriented to person, place, and time.  Psychiatric:        Behavior: Behavior normal.     Vitals:   01/31/21 1505  BP: (!) 150/90  Pulse: (!) 120  SpO2: 96%  Weight: 188 lb 12.8 oz (85.6 kg)  Height: _0  (1.753 m)   96% on RA BMI Readings from Last 3 Encounters:  01/31/21 27.88 kg/m  01/29/21 28.11 kg/m  01/25/21 28.65 kg/m   Wt Readings from Last 3 Encounters:  01/31/21 188 lb 12.8 oz (85.6 kg)  01/29/21 190 lb 6 oz (86.4 kg)  01/25/21 194 lb (88 kg)     CBC    Component Value Date/Time   WBC 5.9 01/20/2021 1911   RBC 4.63 01/20/2021 1911   HGB 11.3 (L) 01/20/2021 1911   HGB 12.2 (L) 01/15/2021 0814   HCT 35.0 (L) 01/20/2021 1911   PLT 332 01/20/2021 1911   PLT 301 01/15/2021 0814   MCV 75.6 (L) 01/20/2021 1911   MCH 24.4 (L) 01/20/2021 1911   MCHC 32.3 01/20/2021 1911   RDW 14.2 01/20/2021 1911   LYMPHSABS 1.3 01/15/2021 0814   MONOABS 0.8 01/15/2021 0814   EOSABS 0.2 01/15/2021 0814   BASOSABS 0.1 01/15/2021 0814    Chest Imaging: 01/26/2021: Chest x-ray Pleural effusion improved after thoracentesis. The patient's images have been independently reviewed by me.    CT scan chest July 2022: Extensive  thoracic metastasis and large left-sided pleural effusion and pleural nodular metastasis. The patient's images have been independently reviewed by me.    Pulmonary Functions Testing Results: No flowsheet data found.  FeNO:   Pathology:   Echocardiogram:   Heart Catheterization:     Assessment & Plan:     ICD-10-CM   1. Pleural effusion on left  J90 CANCELED: Procedural/ Surgical Case Request: INSERTION PLEURAL DRAINAGE CATHETER    2. Primary malignant neoplasm of right kidney with metastasis from kidney to other site (  [place])  C64.1 CANCELED: Procedural/ Surgical Case Request: INSERTION PLEURAL DRAINAGE CATHETER    3. Brain metastases ([place])  C79.31 CANCELED: Procedural/ Surgical Case Request: INSERTION PLEURAL DRAINAGE CATHETER      Discussion:  This is a 50 year old male, left-sided pleural effusion, malignant pleural effusion multiple brain metastasis advanced age renal cell carcinoma.  Plan: We discussed the management of malignant pleural effusion. Patient will need likely repeat thoracentesis. We also discussed utility of indwelling pleural catheter placement. I think due to the extensive his disease and how fast the fluid is reaccumulated.  I think patient would benefit from an indwelling pleural catheter. Patient is agreeable to this plan.  We will plan for IPC placement next week.    Current Outpatient Medications:    HYDROcodone bit-homatropine (HYCODAN) 5-1.5 MG/5ML syrup, Take 5 mLs by mouth every 6 (six) hours as needed for cough., Disp: 120 mL, Rfl: 0   prochlorperazine (COMPAZINE) 10 MG tablet, Take 1 tablet (10 mg total) by mouth every 6 (six) hours as needed for nausea or vomiting., Disp: 30 tablet, Rfl: 0   Garner Nash, DO [facility] 01/31/2021 3:23 PM

## 2021-02-05 ENCOUNTER — Inpatient Hospital Stay: Payer: PRIVATE HEALTH INSURANCE

## 2021-02-05 ENCOUNTER — Encounter: Payer: Self-pay | Admitting: Adult Health

## 2021-02-05 ENCOUNTER — Inpatient Hospital Stay (HOSPITAL_BASED_OUTPATIENT_CLINIC_OR_DEPARTMENT_OTHER): Payer: PRIVATE HEALTH INSURANCE | Admitting: Adult Health

## 2021-02-05 ENCOUNTER — Inpatient Hospital Stay: Payer: PRIVATE HEALTH INSURANCE | Attending: Oncology

## 2021-02-05 ENCOUNTER — Other Ambulatory Visit: Payer: Self-pay

## 2021-02-05 ENCOUNTER — Inpatient Hospital Stay: Payer: PRIVATE HEALTH INSURANCE | Admitting: Nutrition

## 2021-02-05 VITALS — BP 141/91 | HR 121 | Temp 97.7°F | Resp 20 | Wt 186.0 lb

## 2021-02-05 DIAGNOSIS — N2889 Other specified disorders of kidney and ureter: Secondary | ICD-10-CM

## 2021-02-05 DIAGNOSIS — R000 Tachycardia, unspecified: Secondary | ICD-10-CM | POA: Insufficient documentation

## 2021-02-05 DIAGNOSIS — J90 Pleural effusion, not elsewhere classified: Secondary | ICD-10-CM

## 2021-02-05 DIAGNOSIS — C641 Malignant neoplasm of right kidney, except renal pelvis: Secondary | ICD-10-CM

## 2021-02-05 DIAGNOSIS — R634 Abnormal weight loss: Secondary | ICD-10-CM | POA: Diagnosis not present

## 2021-02-05 DIAGNOSIS — J910 Malignant pleural effusion: Secondary | ICD-10-CM | POA: Diagnosis not present

## 2021-02-05 DIAGNOSIS — Z923 Personal history of irradiation: Secondary | ICD-10-CM | POA: Insufficient documentation

## 2021-02-05 DIAGNOSIS — Z5112 Encounter for antineoplastic immunotherapy: Secondary | ICD-10-CM | POA: Insufficient documentation

## 2021-02-05 DIAGNOSIS — F17210 Nicotine dependence, cigarettes, uncomplicated: Secondary | ICD-10-CM | POA: Diagnosis not present

## 2021-02-05 DIAGNOSIS — C7931 Secondary malignant neoplasm of brain: Secondary | ICD-10-CM | POA: Diagnosis not present

## 2021-02-05 DIAGNOSIS — R918 Other nonspecific abnormal finding of lung field: Secondary | ICD-10-CM

## 2021-02-05 LAB — CBC WITH DIFFERENTIAL (CANCER CENTER ONLY)
Abs Immature Granulocytes: 0.01 10*3/uL (ref 0.00–0.07)
Basophils Absolute: 0.1 10*3/uL (ref 0.0–0.1)
Basophils Relative: 1 %
Eosinophils Absolute: 0.1 10*3/uL (ref 0.0–0.5)
Eosinophils Relative: 2 %
HCT: 37.5 % — ABNORMAL LOW (ref 39.0–52.0)
Hemoglobin: 12.2 g/dL — ABNORMAL LOW (ref 13.0–17.0)
Immature Granulocytes: 0 %
Lymphocytes Relative: 22 %
Lymphs Abs: 1.1 10*3/uL (ref 0.7–4.0)
MCH: 23.9 pg — ABNORMAL LOW (ref 26.0–34.0)
MCHC: 32.5 g/dL (ref 30.0–36.0)
MCV: 73.4 fL — ABNORMAL LOW (ref 80.0–100.0)
Monocytes Absolute: 0.8 10*3/uL (ref 0.1–1.0)
Monocytes Relative: 15 %
Neutro Abs: 2.9 10*3/uL (ref 1.7–7.7)
Neutrophils Relative %: 60 %
Platelet Count: 360 10*3/uL (ref 150–400)
RBC: 5.11 MIL/uL (ref 4.22–5.81)
RDW: 14.3 % (ref 11.5–15.5)
WBC Count: 5 10*3/uL (ref 4.0–10.5)
nRBC: 0 % (ref 0.0–0.2)

## 2021-02-05 LAB — CMP (CANCER CENTER ONLY)
ALT: 101 U/L — ABNORMAL HIGH (ref 0–44)
AST: 48 U/L — ABNORMAL HIGH (ref 15–41)
Albumin: 3.4 g/dL — ABNORMAL LOW (ref 3.5–5.0)
Alkaline Phosphatase: 88 U/L (ref 38–126)
Anion gap: 8 (ref 5–15)
BUN: 11 mg/dL (ref 6–20)
CO2: 22 mmol/L (ref 22–32)
Calcium: 12.3 mg/dL — ABNORMAL HIGH (ref 8.9–10.3)
Chloride: 104 mmol/L (ref 98–111)
Creatinine: 0.79 mg/dL (ref 0.61–1.24)
GFR, Estimated: >60 mL/min (ref >60)
Glucose, Bld: 106 mg/dL — ABNORMAL HIGH (ref 70–99)
Potassium: 4.5 mmol/L (ref 3.5–5.1)
Sodium: 134 mmol/L — ABNORMAL LOW (ref 135–145)
Total Bilirubin: 1 mg/dL (ref 0.3–1.2)
Total Protein: 8 g/dL (ref 6.5–8.1)

## 2021-02-05 MED ORDER — SODIUM CHLORIDE 0.9 % IV SOLN
Freq: Once | INTRAVENOUS | Status: AC
Start: 2021-02-05 — End: 2021-02-05
  Filled 2021-02-05: qty 250

## 2021-02-05 MED ORDER — FAMOTIDINE 20 MG IN NS 100 ML IVPB
20.0000 mg | Freq: Once | INTRAVENOUS | Status: AC
  Administered 2021-02-05: 20 mg via INTRAVENOUS

## 2021-02-05 MED ORDER — SODIUM CHLORIDE 0.9 % IV SOLN
85.0000 mg | Freq: Once | INTRAVENOUS | Status: AC
  Administered 2021-02-05: 85 mg via INTRAVENOUS
  Filled 2021-02-05: qty 17

## 2021-02-05 MED ORDER — SODIUM CHLORIDE 0.9 % IV SOLN
240.0000 mg | Freq: Once | INTRAVENOUS | Status: AC
  Administered 2021-02-05: 240 mg via INTRAVENOUS
  Filled 2021-02-05: qty 24

## 2021-02-05 MED ORDER — DIPHENHYDRAMINE HCL 50 MG/ML IJ SOLN
25.0000 mg | Freq: Once | INTRAMUSCULAR | Status: AC
  Administered 2021-02-05: 25 mg via INTRAVENOUS

## 2021-02-05 NOTE — Patient Instructions (Addendum)
[facility]   Discharge Instructions: Thank you for choosing [facility] to provide your oncology and hematology care.   If you have a lab appointment with the [facility], please go directly to the [facility] and check in at the registration area.   Wear comfortable clothing and clothing appropriate for easy access to any Portacath or PICC line.   We strive to give you quality time with your provider. You may need to reschedule your appointment if you arrive late (15 or more minutes).  Arriving late affects you and other patients whose appointments are after yours.  Also, if you miss three or more appointments without notifying the office, you may be dismissed from the clinic at the provider's discretion.      For prescription refill requests, have your pharmacy contact our office and allow 72 hours for refills to be completed.    Today you received the following chemotherapy and/or immunotherapy agents: ipilimumab and nivolumab.      To help prevent nausea and vomiting after your treatment, we encourage you to take your nausea medication as directed.  BELOW ARE SYMPTOMS THAT SHOULD BE REPORTED IMMEDIATELY: *FEVER GREATER THAN 100.4 F (38 C) OR HIGHER *CHILLS OR SWEATING *NAUSEA AND VOMITING THAT IS NOT CONTROLLED WITH YOUR NAUSEA MEDICATION *UNUSUAL SHORTNESS OF BREATH *UNUSUAL BRUISING OR BLEEDING *URINARY PROBLEMS (pain or burning when urinating, or frequent urination) *BOWEL PROBLEMS (unusual diarrhea, constipation, pain near the anus) TENDERNESS IN MOUTH AND THROAT WITH OR WITHOUT PRESENCE OF ULCERS (sore throat, sores in mouth, or a toothache) UNUSUAL RASH, SWELLING OR PAIN  UNUSUAL VAGINAL DISCHARGE OR ITCHING   Items with * indicate a potential emergency and should be followed up as soon as possible or go to the Emergency Department if any problems should occur.  Please show the CHEMOTHERAPY ALERT CARD or IMMUNOTHERAPY ALERT  CARD at check-in to the Emergency Department and triage nurse.  Should you have questions after your visit or need to cancel or reschedule your appointment, please contact [facility]  Dept: [phone]  and follow the prompts.  Office hours are 8:00 a.m. to 4:30 p.m. Monday - Friday. Please note that voicemails left after 4:00 p.m. may not be returned until the following business day.  We are closed weekends and major holidays. You have access to a nurse at all times for urgent questions. Please call the main number to the clinic Dept: [phone] and follow the prompts.   For any non-urgent questions, you may also contact your provider using MyChart. We now offer e-Visits for anyone 50 and older to request care online for non-urgent symptoms. For details visit [email or web address].   Also download the MyChart app! Go to the app store, search "MyChart", open the app, select [facility], and log in with your MyChart username and password.  Due to Covid, a mask is required upon entering the hospital/clinic. If you do not have a mask, one will be given to you upon arrival. For doctor visits, patients may have 1 support person aged 70 or older with them. For treatment visits, patients cannot have anyone with them due to current Covid guidelines and our immunocompromised population.

## 2021-02-05 NOTE — Progress Notes (Signed)
Per Wilber Bihari NP, ok to treat with elevated HR.

## 2021-02-05 NOTE — Patient Instructions (Signed)
Ipilimumab injection What is this medication? IPILIMUMAB (IP i LIM ue mab) is a monoclonal antibody. It is used to treat colorectal cancer, kidney cancer, liver cancer, lung cancer, melanoma, andmesothelioma. This medicine may be used for other purposes; ask your health care provider orpharmacist if you have questions. COMMON BRAND NAME(S): YERVOY What should I tell my care team before I take this medication? They need to know if you have any of these conditions: autoimmune diseases like Crohn's disease, ulcerative colitis, or lupus have had or planning to have an allogeneic stem cell transplant (uses someone else's stem cells) history of organ transplant nervous system problems like myasthenia gravis or Guillain-Barre syndrome an unusual or allergic reaction to ipilimumab, other medicines, foods, dyes, or preservatives pregnant or trying to get pregnant breast-feeding How should I use this medication? This medicine is for infusion into a vein. It is given by a health careprofessional in a hospital or clinic setting. A special MedGuide will be given to you before each treatment. Be sure to readthis information carefully each time. Talk to your pediatrician regarding the use of this medicine in children. While this drug may be prescribed for children as young as 12 years for selectedconditions, precautions do apply. Overdosage: If you think you have taken too much of this medicine contact apoison control center or emergency room at once. NOTE: This medicine is only for you. Do not share this medicine with others. What if I miss a dose? It is important not to miss your dose. Call your doctor or health careprofessional if you are unable to keep an appointment. What may interact with this medication? Interactions are not expected. This list may not describe all possible interactions. Give your health care provider a list of all the medicines, herbs, non-prescription drugs, or dietary supplements  you use. Also tell them if you smoke, drink alcohol, or use illegaldrugs. Some items may interact with your medicine. What should I watch for while using this medication? Tell your doctor or healthcare professional if your symptoms do not start toget better or if they get worse. Do not become pregnant while taking this medicine or for 3 months after stopping it. Women should inform their doctor if they wish to become pregnant or think they might be pregnant. There is a potential for serious side effects to an unborn child. Talk to your health care professional or pharmacist for more information. Do not breast-feed an infant while taking this medicine orfor 3 months after the last dose. Your condition will be monitored carefully while you are receiving thismedicine. You may need blood work done while you are taking this medicine. What side effects may I notice from receiving this medication? Side effects that you should report to your doctor or health care professionalas soon as possible: allergic reactions like skin rash, itching or hives, swelling of the face, lips, or tongue black, tarry stools bloody or watery diarrhea changes in vision dizziness eye pain fast, irregular heartbeat feeling anxious feeling faint or lightheaded, falls nausea, vomiting pain, tingling, numbness in the hands or feet redness, blistering, peeling or loosening of the skin, including inside the mouth signs and symptoms of liver injury like dark yellow or brown urine; general ill feeling or flu-like symptoms; light-colored stools; loss of appetite; nausea; right upper belly pain; unusually weak or tired; yellowing of the eyes or skin unusual bleeding or bruising Side effects that usually do not require medical attention (report to yourdoctor or health care professional if they continue or are  bothersome): headache loss of appetite trouble sleeping This list may not describe all possible side effects. Call your  doctor for medical advice about side effects. You may report side effects to FDA at1-800-FDA-1088. Where should I keep my medication? This drug is given in a hospital or clinic and will not be stored at home. NOTE: This sheet is a summary. It may not cover all possible information. If you have questions about this medicine, talk to your doctor, pharmacist, orhealth care provider.  2022 Elsevier/Gold Standard (2019-05-26 18:53:00) Nivolumab injection What is this medication? NIVOLUMAB (nye VOL ue mab) is a monoclonal antibody. It treats certain types of cancer. Some of the cancers treated are colon cancer, head and neck cancer,Hodgkin lymphoma, lung cancer, and melanoma. This medicine may be used for other purposes; ask your health care provider orpharmacist if you have questions. COMMON BRAND NAME(S): Opdivo What should I tell my care team before I take this medication? They need to know if you have any of these conditions: autoimmune diseases like Crohn's disease, ulcerative colitis, or lupus have had or planning to have an allogeneic stem cell transplant (uses someone else's stem cells) history of chest radiation history of organ transplant nervous system problems like myasthenia gravis or Guillain-Barre syndrome an unusual or allergic reaction to nivolumab, other medicines, foods, dyes, or preservatives pregnant or trying to get pregnant breast-feeding How should I use this medication? This medicine is for infusion into a vein. It is given by a health careprofessional in a hospital or clinic setting. A special MedGuide will be given to you before each treatment. Be sure to readthis information carefully each time. Talk to your pediatrician regarding the use of this medicine in children. While this drug may be prescribed for children as young as 12 years for selectedconditions, precautions do apply. Overdosage: If you think you have taken too much of this medicine contact apoison control  center or emergency room at once. NOTE: This medicine is only for you. Do not share this medicine with others. What if I miss a dose? It is important not to miss your dose. Call your doctor or health careprofessional if you are unable to keep an appointment. What may interact with this medication? Interactions have not been studied. This list may not describe all possible interactions. Give your health care provider a list of all the medicines, herbs, non-prescription drugs, or dietary supplements you use. Also tell them if you smoke, drink alcohol, or use illegaldrugs. Some items may interact with your medicine. What should I watch for while using this medication? This drug may make you feel generally unwell. Continue your course of treatmenteven though you feel ill unless your doctor tells you to stop. You may need blood work done while you are taking this medicine. Do not become pregnant while taking this medicine or for 5 months after stopping it. Women should inform their doctor if they wish to become pregnant or think they might be pregnant. There is a potential for serious side effects to an unborn child. Talk to your health care professional or pharmacist for more information. Do not breast-feed an infant while taking this medicine orfor 5 months after stopping it. What side effects may I notice from receiving this medication? Side effects that you should report to your doctor or health care professionalas soon as possible: allergic reactions like skin rash, itching or hives, swelling of the face, lips, or tongue breathing problems blood in the urine bloody or watery diarrhea or black, tarry  stools changes in emotions or moods changes in vision chest pain cough dizziness feeling faint or lightheaded, falls fever, chills headache with fever, neck stiffness, confusion, loss of memory, sensitivity to light, hallucination, loss of contact with reality, or seizures joint pain mouth  sores redness, blistering, peeling or loosening of the skin, including inside the mouth severe muscle pain or weakness signs and symptoms of high blood sugar such as dizziness; dry mouth; dry skin; fruity breath; nausea; stomach pain; increased hunger or thirst; increased urination signs and symptoms of kidney injury like trouble passing urine or change in the amount of urine signs and symptoms of liver injury like dark yellow or brown urine; general ill feeling or flu-like symptoms; light-colored stools; loss of appetite; nausea; right upper belly pain; unusually weak or tired; yellowing of the eyes or skin swelling of the ankles, feet, hands trouble passing urine or change in the amount of urine unusually weak or tired weight gain or loss Side effects that usually do not require medical attention (report to yourdoctor or health care professional if they continue or are bothersome): bone pain constipation decreased appetite diarrhea muscle pain nausea, vomiting tiredness This list may not describe all possible side effects. Call your doctor for medical advice about side effects. You may report side effects to FDA at1-800-FDA-1088. Where should I keep my medication? This drug is given in a hospital or clinic and will not be stored at home. NOTE: This sheet is a summary. It may not cover all possible information. If you have questions about this medicine, talk to your doctor, pharmacist, orhealth care provider.  2022 Elsevier/Gold Standard (2019-10-27 10:08:25)

## 2021-02-05 NOTE — Progress Notes (Signed)
50 year old male diagnosed with metastatic kidney cancer.  He is followed by Dr. Isidore Moos, Dr. Mickeal Skinner, and [name].  Patient is receiving Opdivo and Yervoy.  Status post SRS.  Past medical history not significant.  Medications include Compazine.  Labs include sodium 134 and glucose 106. Height: 5 feet 9 inches. Weight: 186 pounds. Usual body weight: 215 pounds per patient.  Patient weighed 205 pounds May 2022. BMI: 27.47.  Patient denies nausea, vomiting, constipation, and diarrhea. He reports biggest challenge is poor to no appetite. States he tries to eat 3 meals daily and drink original boost 3 times daily between meals. He may eat something like bacon and eggs for breakfast, chicken noodle soup or Chowder for lunch and chicken with rice for dinner.  He drinks water, tea, or soda. Physical focused nutrition exam was deferred.    Nutrition diagnosis: Unintended weight loss related to metastatic cancer as evidenced by 9% weight loss in less than 3 months which is significant.  Intervention: Educated patient on the importance of continuing 3 meals with 3 snacks daily.  Reviewed strategies of adding calories and protein.  Recommended patient increase boost original to boost plus or equivalent and continue 3 times daily   between meals. Patient tried Ensure complete and stated he enjoyed it. Provided coupons.  Also gave patient nutrition facts sheets and contact information.  Monitoring, evaluation, goals: Patient will tolerate increased calories and protein to minimize further weight loss.  Next visit: Monday, September 12 during infusion.  **Disclaimer: This note was dictated with voice recognition software. Similar sounding words can inadvertently be transcribed and this note may contain transcription errors which may not have been corrected upon publication of note.**

## 2021-02-05 NOTE — Assessment & Plan Note (Addendum)
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    PET 01/18/2021: large right renal mass, thoracic metastases, left lung lobe and pleural metastases invading the fifth rib, large left pleural effusion, right lung nodular metastases, bulky mediastinal paratracheal metastatic adenopathy, left suprarenal oblong mass, small subcutaneous nodule metastases superficial to the right gluteus muscle.  MRI brain on 01/19/2021: Two cerebral metastases--60mm in left temporal lobe, 44mm in right temporal lobe  Treatment Plan:   1.  Treatment: Ipilimumab 1 mg/kg with nivolumab 3 mg/kg to start on January 15, 2021  A. Zometa given on 01/15/2021 for hypercalcemia related to malignancy  2. Stereotactic Radiosurgery [facility]) given to patient with 20Gy to each lesion on 01/29/2021 ____________________________________________________________________________________________________  1. Stage IV renal cell carcinoma: I reviewed with [place] and his wife that he has clinical stage IV renal cell cancer.  I discussed the importance for him to continue treatment, but also for [place] to get the CT guided biopsy to get more information on the specifics related to the cancer.  They understand this.  We will have his nurse in infusion give him the phone number to get this scheduled.     2. Brain Metastases: s/p SRS and doing well.  He denies any residual effects and will f/u with Dr. Isidore Moos on 8/23 as scheduled.     3.  Hypercalcemia of malignancy: checking calcium every three weeks, he can receive Zometa as needed.  Calcium pending at time of appointment completion, however no signs of hypercalcemia are present.     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     5. Tachycardia: I reviewed this with Dr. Jana Hakim, to ensure there is no indication this new tachycardia could be myocarditis since that can happen more often during the first 3 months of  treatment.  Dr. Jana Hakim recommends that [facility] proceed with treatment today, and that we f/u on his HR after pleural effusion drainage, as that is more likely the cause.    6.  Weight loss: I recommended continued oral intake.  He will see Dory Peru our nutritionist in the chemo room today for education on his diet.     Zamir will return to clinic in 3 weeks for labs, f/u with [name], and his next treatment.  He knows to call for any questions or concerns that may arise prior to his next appointment with [place].  We are happy to see him sooner if needed.

## 2021-02-05 NOTE — Progress Notes (Signed)
[facility] Follow up:    Patient, No Pcp Per (Inactive) No address on file   DIAGNOSIS: stage IV renal cell adenocarcinoma  SUMMARY OF ONCOLOGIC HISTORY: Initially presented to the ER 12/20/2020 with shortness of breath, CT scans showed numerous bilateral pulmonary nodules with a pleural-based metastatic disease in the left hemithorax with a large pleural effusion.  Mediastinal lymphadenopathy were noted with the largest right paratracheal lymph node measuring 3.2 cm.  His kidney was not completely visualized but there is a suggestion of a renal mass that could be responsible as a primary etiology. PET scan on 01/18/2021 shows large right renal mass consistent with primary renal carcinoma, thoracic metastases, left lung lobe and pleural metastases invading the fifth rib, large left pleural effusion, right lung nodular metastases, bulky mediastinal paratracheal metastatic adenopathy, left suprarenal oblong mass, small subcutaneous nodule metastases superficial to the right gluteus muscle. MRI brain on 01/19/2021: Two cerebral metastases--89mm in left temporal lobe, 46mm in right temporal lobe    2. 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 To undergo pleurx placement on 02/06/2021   3. Treatment: Ipilimumab 1 mg/kg with nivolumab 3 mg/kg to start on January 15, 2021  A. Zometa given on 01/15/2021 for hypercalcemia related to malignancy  4. Stereotactic Radiosurgery [facility]) given to patient with 20Gy to each lesion on 01/29/2021  CURRENT THERAPY: Ipilumimab/Nivolumab, Zometa  INTERVAL HISTORY: Dylan Good 50 y.o. male returns for follow up of his metastatic renal cell carcinoma prior to receiving his second treatment with Ipilumimab and Nivolumab.  He is tolerating treatment well.  His appetite has improved.  He denies any current concerns today.    Dylan Good is not having pain.  He has recurrent pleural effusions and  is scheduled for a pleurx placement tomorrow.  He has not undergone biopsy yet, and we called radiology to inquire why.  They let us know that they have not been able to get through to [facility].  Dylan Good is eating and drinking well.  His heart rate is elevated and he says that he notices it is when he has increased fluid on his lungs.  He denies any chest pain, palpitations, shortness of breath.    Dylan Good is having a bowel movement about every other day which is his baseline even prior to his diagnosis.    Dylan Good notes he tolerated the SRS quite well and has had no issues related to his treatment.  He has f/u with Dr. Isidore Moos on 02/27/2021.  Patient Active Problem List   Diagnosis Date Noted   Pleural effusion on left 01/31/2021   Brain metastases ([place]) 01/24/2021   Hypercalcemia 01/15/2021   Kidney cancer, primary, with metastasis from kidney to other site [facility]) 12/29/2020    has No Known Allergies.  MEDICAL HISTORY: Past Medical History:  Diagnosis Date   Cancer, metastatic to lung [facility])    Renal cell adenocarcinoma, left (HCC)     SURGICAL HISTORY: Past Surgical History:  Procedure Laterality Date   APPENDECTOMY      SOCIAL HISTORY: Social History   Socioeconomic History   Marital status: Married    Spouse name: Not on file   Number of children: Not on file   Years of education: Not on file   Highest education level: Not on file  Occupational History   Not on file  Tobacco Use   Smoking status: Some Days    Packs/day: 0.50    Types: Cigarettes   Smokeless  tobacco: Never  Substance and Sexual Activity   Alcohol use: Never   Drug use: Never   Sexual activity: Not on file  Other Topics Concern   Not on file  Social History Narrative   Not on file   Social Determinants of Health   Financial Resource Strain: Not on file  Food Insecurity: Not on file  Transportation Needs: Not on file  Physical Activity: Not on file  Stress: Not on file  Social Connections: Not on  file  Intimate Partner Violence: Not on file    FAMILY HISTORY: History reviewed. No pertinent family history.  Review of Systems  Constitutional:  Positive for fatigue and unexpected weight change. Negative for appetite change, chills and fever.  HENT:   Negative for hearing loss, lump/mass and trouble swallowing.   Eyes:  Negative for eye problems and icterus.  Respiratory:  Positive for shortness of breath (related to his pleural effusions). Negative for chest tightness and cough.   Cardiovascular:  Negative for chest pain, leg swelling and palpitations.  Gastrointestinal:  Negative for abdominal distention, abdominal pain, constipation, diarrhea, nausea and vomiting.  Endocrine: Negative for hot flashes.  Genitourinary:  Negative for difficulty urinating.   Musculoskeletal:  Negative for arthralgias.  Skin:  Negative for itching and rash.  Neurological:  Negative for dizziness, extremity weakness, headaches and numbness.  Hematological:  Negative for adenopathy. Does not bruise/bleed easily.  Psychiatric/Behavioral:  Negative for depression. The patient is not nervous/anxious.      PHYSICAL EXAMINATION  ECOG PERFORMANCE STATUS: 1 - Symptomatic but completely ambulatory  Vitals:   02/05/21 1045  BP: (!) 141/91  Pulse: (!) 121  Resp: 20  Temp: 97.7 F (36.5 C)  SpO2: 99%    Physical Exam Constitutional:      General: He is not in acute distress.    Appearance: Normal appearance. He is not toxic-appearing.  HENT:     Head: Normocephalic and atraumatic.  Eyes:     General: No scleral icterus. Cardiovascular:     Rate and Rhythm: Regular rhythm. Tachycardia present.     Pulses: Normal pulses.     Heart sounds: Murmur (only during inspiration over mitral valve, all other locations no murmur noted) heard.     Comments: Heart rate is regular and tachy at 120 per auscultation Pulmonary:     Effort: Pulmonary effort is normal.     Breath sounds: Normal breath sounds.   Abdominal:     General: Abdomen is flat. Bowel sounds are normal. There is no distension.     Palpations: Abdomen is soft. There is mass (large mass in right abdomen).     Tenderness: There is no abdominal tenderness.  Musculoskeletal:        General: No swelling.     Cervical back: Neck supple.  Lymphadenopathy:     Cervical: No cervical adenopathy.  Skin:    General: Skin is warm and dry.     Findings: No rash.  Neurological:     General: No focal deficit present.     Mental Status: He is alert and oriented to person, place, and time.  Psychiatric:        Mood and Affect: Mood normal.        Behavior: Behavior normal.    LABORATORY DATA:  CBC    Component Value Date/Time   WBC 5.0 02/05/2021 1027   WBC 5.9 01/20/2021 1911   RBC 5.11 02/05/2021 1027   HGB 12.2 (L) 02/05/2021 1027  HCT 37.5 (L) 02/05/2021 1027   PLT 360 02/05/2021 1027   MCV 73.4 (L) 02/05/2021 1027   MCH 23.9 (L) 02/05/2021 1027   MCHC 32.5 02/05/2021 1027   RDW 14.3 02/05/2021 1027   LYMPHSABS 1.1 02/05/2021 1027   MONOABS 0.8 02/05/2021 1027   EOSABS 0.1 02/05/2021 1027   BASOSABS 0.1 02/05/2021 1027    CMP     Component Value Date/Time   NA 134 (L) 02/05/2021 1027   K 4.5 02/05/2021 1027   CL 104 02/05/2021 1027   CO2 22 02/05/2021 1027   GLUCOSE 106 (H) 02/05/2021 1027   BUN 11 02/05/2021 1027   CREATININE 0.79 02/05/2021 1027   CALCIUM 12.3 (H) 02/05/2021 1027   PROT 8.0 02/05/2021 1027   ALBUMIN 3.4 (L) 02/05/2021 1027   AST 48 (H) 02/05/2021 1027   ALT 101 (H) 02/05/2021 1027   ALKPHOS 88 02/05/2021 1027   BILITOT 1.0 02/05/2021 1027   GFRNONAA >60 02/05/2021 1027       ASSESSMENT and PLAN:   Kidney cancer, primary, with metastasis from kidney to other site [facility]) Dylan Good is a 50 year old man who is here today for follow up and evaluation of his stage IV renal cell adenocarcinoma.    PET 01/18/2021: large right renal mass, thoracic metastases, left lung lobe and pleural  metastases invading the fifth rib, large left pleural effusion, right lung nodular metastases, bulky mediastinal paratracheal metastatic adenopathy, left suprarenal oblong mass, small subcutaneous nodule metastases superficial to the right gluteus muscle.  MRI brain on 01/19/2021: Two cerebral metastases--38mm in left temporal lobe, 18mm in right temporal lobe  Treatment Plan:   1.  Treatment: Ipilimumab 1 mg/kg with nivolumab 3 mg/kg to start on January 15, 2021  A. Zometa given on 01/15/2021 for hypercalcemia related to malignancy  2. Stereotactic Radiosurgery [facility]) given to patient with 20Gy to each lesion on 01/29/2021 ____________________________________________________________________________________________________  1. Stage IV renal cell carcinoma: I reviewed with Dylan Good and his wife that he has clinical stage IV renal cell cancer.  I discussed the importance for him to continue treatment, but also for [place] to get the CT guided biopsy to get more information on the specifics related to the cancer.  They understand this.  We will have his nurse in infusion give him the phone number to get this scheduled.     2. Brain Metastases: s/p SRS and doing well.  He denies any residual effects and will f/u with Dr. Isidore Moos on 8/23 as scheduled.     3.  Hypercalcemia of malignancy: checking calcium every three weeks, he can receive Zometa as needed.  Calcium pending at time of appointment completion, however no signs of hypercalcemia are present.     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     5. Tachycardia: I reviewed this with Dr. Jana Hakim, to ensure there is no indication this new tachycardia could be myocarditis since that can happen more often during the first 3 months of treatment.  Dr. Jana Hakim recommends that Dylan Good proceed with treatment today, and that we f/u on his HR after pleural effusion drainage, as that is more likely the cause.    6.  Weight loss: I recommended  continued oral intake.  He will see Dory Peru our nutritionist in the chemo room today for education on his diet.     Dylan Good will return to clinic in 3 weeks for labs, f/u with [name], and his next treatment.  He knows to call for  any questions or concerns that may arise prior to his next appointment with [place].  We are happy to see him sooner if needed.     No orders of the defined types were placed in this encounter.   All questions were answered. The patient knows to call the clinic with any problems, questions or concerns. We can certainly see the patient much sooner if necessary.  Total encounter time: 30 minutes in face to face visit time, chart review, lab review, imaging review, care coordination, order entry, and documentation of the encounter.    Wilber Bihari, NP 02/05/21 12:23 PM Medical Oncology and Hematology [facility] [place], [place] [address] Tel. [phone]    Fax. [phone]  *Total Encounter Time as defined by the Centers for Medicare and Medicaid Services includes, in addition to the face-to-face time of a patient visit (documented in the note above) non-face-to-face time: obtaining and reviewing outside history, ordering and reviewing medications, tests or procedures, care coordination (communications with other health care professionals or caregivers) and documentation in the medical record.

## 2021-02-05 NOTE — Progress Notes (Signed)

## 2021-02-06 ENCOUNTER — Ambulatory Visit (HOSPITAL_COMMUNITY): Payer: PRIVATE HEALTH INSURANCE

## 2021-02-06 ENCOUNTER — Encounter (HOSPITAL_COMMUNITY)
Admission: RE | Disposition: A | Discharge status: Home / Self Care | Payer: Self-pay | Source: Home / Self Care | Attending: Pulmonary Disease

## 2021-02-06 ENCOUNTER — Ambulatory Visit (HOSPITAL_COMMUNITY)
Admission: RE | Admit: 2021-02-06 | Discharge: 2021-02-06 | Disposition: A | Discharge status: Home / Self Care | Payer: PRIVATE HEALTH INSURANCE | Attending: Pulmonary Disease | Admitting: Pulmonary Disease

## 2021-02-06 DIAGNOSIS — C641 Malignant neoplasm of right kidney, except renal pelvis: Secondary | ICD-10-CM | POA: Diagnosis not present

## 2021-02-06 DIAGNOSIS — F17210 Nicotine dependence, cigarettes, uncomplicated: Secondary | ICD-10-CM | POA: Diagnosis not present

## 2021-02-06 DIAGNOSIS — C7931 Secondary malignant neoplasm of brain: Secondary | ICD-10-CM | POA: Diagnosis not present

## 2021-02-06 DIAGNOSIS — J910 Malignant pleural effusion: Secondary | ICD-10-CM | POA: Diagnosis not present

## 2021-02-06 DIAGNOSIS — J90 Pleural effusion, not elsewhere classified: Secondary | ICD-10-CM | POA: Diagnosis not present

## 2021-02-06 DIAGNOSIS — Z9689 Presence of other specified functional implants: Secondary | ICD-10-CM

## 2021-02-06 DIAGNOSIS — C649 Malignant neoplasm of unspecified kidney, except renal pelvis: Secondary | ICD-10-CM | POA: Insufficient documentation

## 2021-02-06 DIAGNOSIS — R0600 Dyspnea, unspecified: Secondary | ICD-10-CM | POA: Insufficient documentation

## 2021-02-06 HISTORY — PX: CHEST TUBE INSERTION: SHX231

## 2021-02-06 IMAGING — DX DG CHEST 1V PORT
1 series · 1 of 1 positions shown · non-contrast
Comparison: [DATE]

CLINICAL DATA: Left chest tube, effusion

EXAM:
PORTABLE CHEST 1 VIEW

[chest ap]
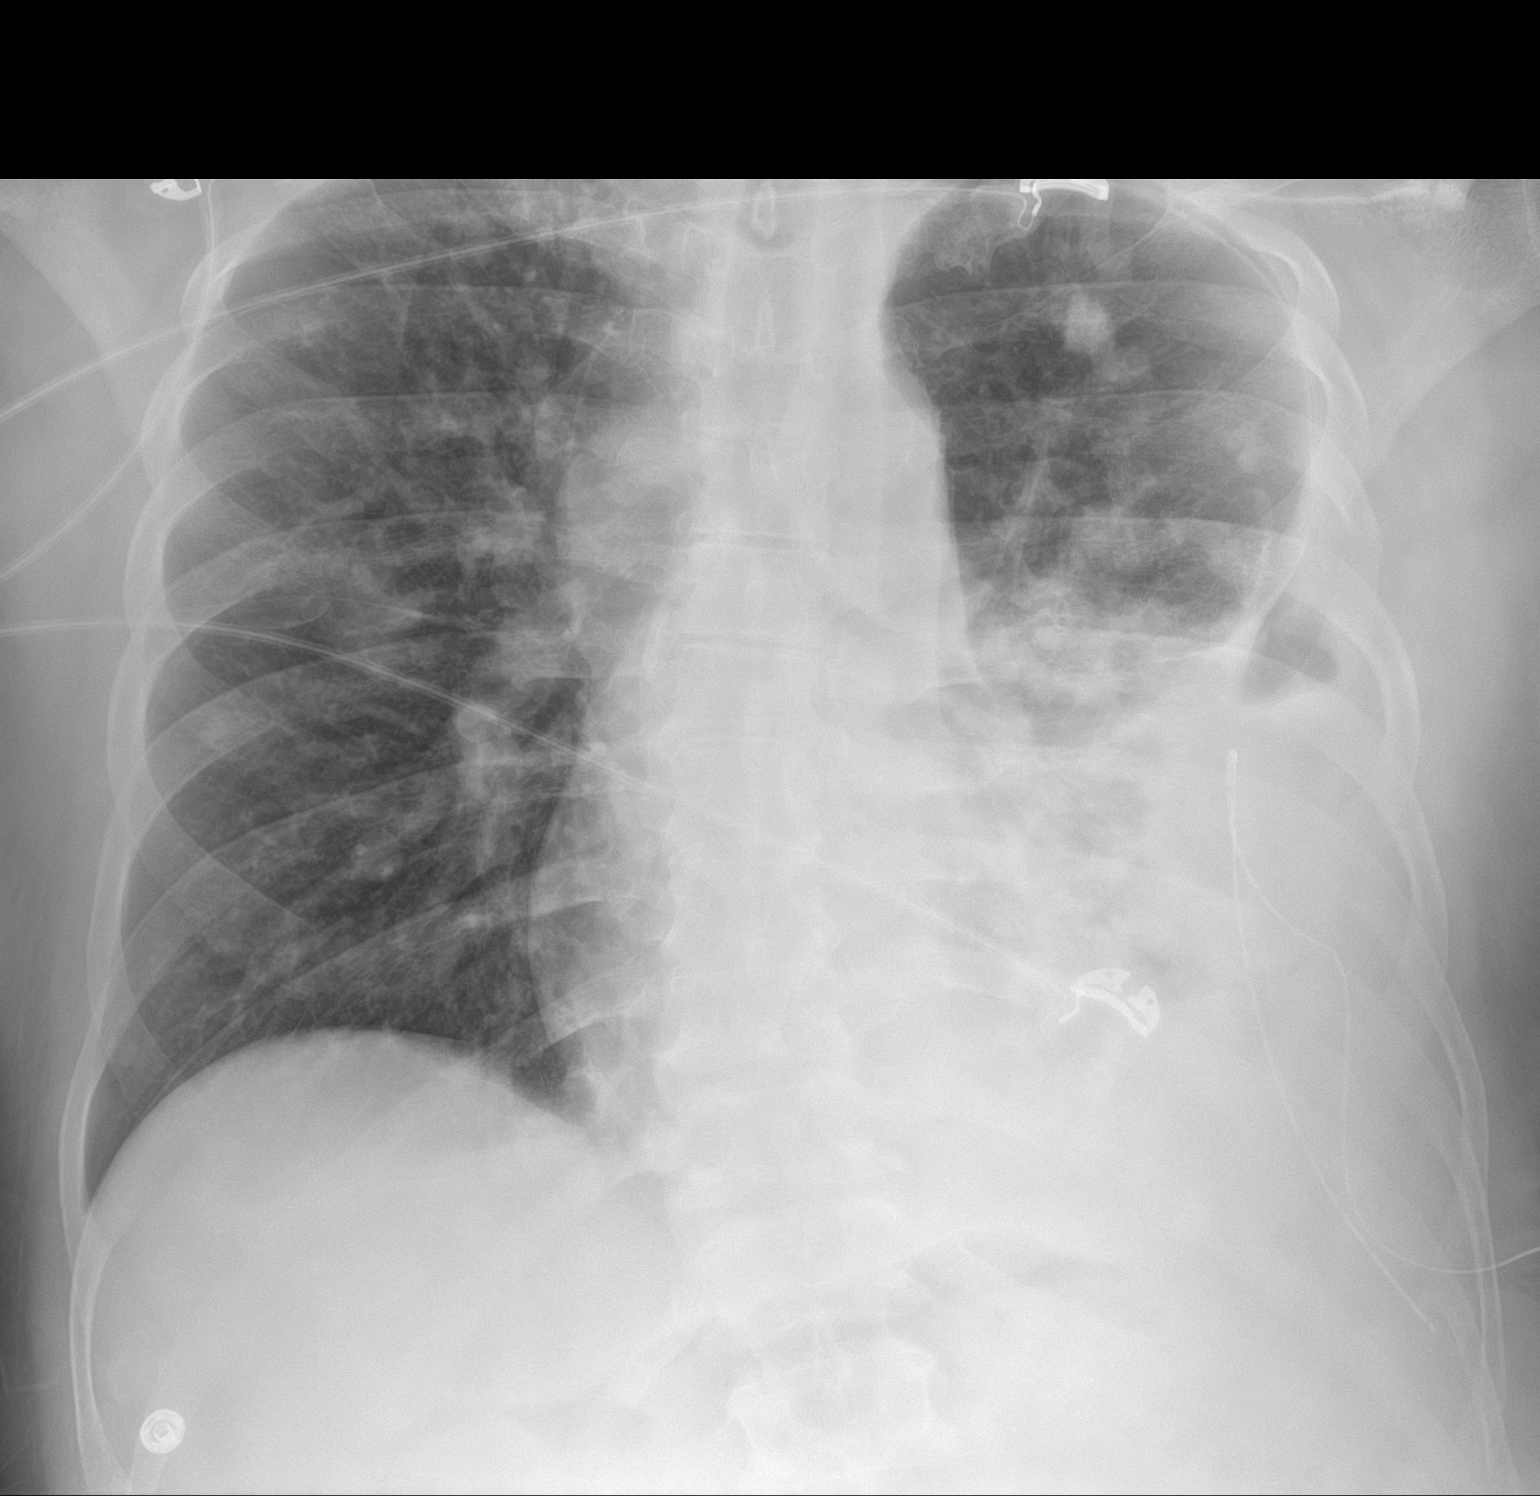

[1 of 1 positions shown; findings below may reference images not displayed]

FINDINGS: Single frontal view of the chest demonstrates a partially loculated
left pleural effusion, with a small amount of gas in the fluid
collection likely reflecting interval pleural drainage catheter
placement. Overall slight decrease in size of the pleural fluid.
Continued bilateral pulmonary nodules with dense left basilar
consolidation unchanged. No acute bony abnormality.
IMPRESSION: 1. Left-sided hydropneumothorax, decreased in size since prior
study. Interval placement of indwelling pleural drainage catheter.
2. Stable dense left basilar consolidation.
3. Multiple pulmonary nodules consistent with known metastases.

## 2021-02-06 SURGERY — INSERTION, PLEURAL DRAINAGE CATHETER
Anesthesia: LOCAL | Laterality: Left

## 2021-02-06 SURGERY — INSERTION, PLEURAL DRAINAGE CATHETER
Anesthesia: Topical | Laterality: Left

## 2021-02-06 MED ORDER — LIDOCAINE HCL (PF) 1 % IJ SOLN
INTRAMUSCULAR | Status: AC
  Filled 2021-02-06: qty 30

## 2021-02-06 NOTE — Interval H&P Note (Signed)
History and Physical Interval Note:  02/06/2021 2:02 PM  Dylan Good  has presented today for surgery, with the diagnosis of pleural effusion (L).  The various methods of treatment have been discussed with the patient and family. After consideration of risks, benefits and other options for treatment, the patient has consented to  Procedure(s): INSERTION PLEURAL DRAINAGE CATHETER (Left) as a surgical intervention.  The patient's history has been reviewed, patient examined, no change in status, stable for surgery.  I have reviewed the patient's chart and labs.  Questions were answered to the patient's satisfaction.     [place]

## 2021-02-06 NOTE — Op Note (Signed)
Left Sided PleurX Insertion Procedure Note  GURDEEP KEESEY  [id number]  14-Mar-1971  Date:02/06/21  Time:2:58 PM   Provider Performing:Benetta Maclaren L Ranya Fiddler  Procedure: PleurX Tunneled Pleural Catheter Placement (54627)  Indication(s) Relief of dyspnea from recurrent effusion  Consent Risks of the procedure as well as the alternatives and risks of each were explained to the patient and/or caregiver.  Consent for the procedure was obtained.  Anesthesia Topical only with 1% lidocaine   Time Out Verified patient identification, verified procedure, site/side was marked, verified correct patient position, special equipment/implants available, medications/allergies/relevant history reviewed, required imaging and test results available.  Sterile Technique Maximal sterile technique including sterile barrier drape, hand hygiene, sterile gown, sterile gloves, mask, hair covering.  Procedure Description Ultrasound used to identify appropriate pleural anatomy for placement on the left chest and overlying skin marked.  Area of drainage cleaned and draped in sterile fashion.   Lidocaine was used to anesthetize the skin and subcutaneous tissue.   1.5 cm incision made overlying fluid and another about 5 cm anterior to this along chest wall.  PleurX catheter inserted in usual sterile fashion using modified seldinger technique.  Interrupted silk sutures placed at catheter insertion and tunneling points which will be removed at later date.  PleurX catheter then hooked to suction.  After fluid aspirated, pleurX capped and sterile dressing applied.  Complications/Tolerance None; patient tolerated the procedure well. Chest X-ray is ordered to confirm no post-procedural complication.  EBL Minimal  Specimen(s) fluid 1L of bloody pleural fluid, left   Garner Nash, DO [facility] 02/06/2021 2:59 PM

## 2021-02-06 NOTE — Discharge Planning (Signed)
RNCM consulted regarding home health services set-up.  RNCM unable to secure home health agency as pt insurance does not cover home health services.  RNCM notified staff to educate patient along with family on kit and dressing changes needed in the home.  Quick office follow-up also recommended.

## 2021-02-07 ENCOUNTER — Encounter (HOSPITAL_COMMUNITY): Payer: Self-pay | Admitting: Pulmonary Disease

## 2021-02-08 LAB — CYTOLOGY - NON PAP

## 2021-02-22 ENCOUNTER — Ambulatory Visit: Payer: No Typology Code available for payment source | Admitting: Adult Health

## 2021-02-22 ENCOUNTER — Other Ambulatory Visit: Payer: Self-pay

## 2021-02-22 ENCOUNTER — Encounter: Payer: Self-pay | Admitting: Adult Health

## 2021-02-22 VITALS — BP 130/70 | HR 105 | Temp 98.1°F | Ht 69.0 in | Wt 185.4 lb

## 2021-02-22 DIAGNOSIS — C7931 Secondary malignant neoplasm of brain: Secondary | ICD-10-CM | POA: Diagnosis not present

## 2021-02-22 DIAGNOSIS — C641 Malignant neoplasm of right kidney, except renal pelvis: Secondary | ICD-10-CM | POA: Diagnosis not present

## 2021-02-22 DIAGNOSIS — J90 Pleural effusion, not elsewhere classified: Secondary | ICD-10-CM

## 2021-02-22 NOTE — Assessment & Plan Note (Signed)
Recurrent large left pleural effusion` presumed malignant requiring multiple thoracentesis now with Pleurx catheter.  Presumed malignant effusion- cytology is showed atypical cells. Continue with Pleurx drainage.  Will decrease to every 3 days.  Continue with Pleurx care.  Patient education given. Chest x-ray today. Continue follow-up with oncology as planned. Pleurx sutures were removed without difficulty.  Plan  Patient Instructions  Chest xray today .  Drain Pleurx every 3 days  Continue with Pleurx care  Report any signs of infection, drainage, redness, fever.  Follow up with Dr. Valeta Harms in 4-6 weeks and As needed   Please contact office for sooner follow up if symptoms do not improve or worsen or seek emergency care

## 2021-02-22 NOTE — Assessment & Plan Note (Signed)
Continue with follow-up with medical and radiation oncology

## 2021-02-22 NOTE — Assessment & Plan Note (Signed)
Continue with follow-up with oncology and treatment plan

## 2021-02-22 NOTE — Progress Notes (Signed)
@Patient  ID: Dylan Good, male    DOB: [birth year], 50 y.o.   MRN: [id number]  Chief Complaint  Patient presents with   Follow-up    Referring provider: No ref. provider found  HPI: 50 year old male seen for pulmonary consult January 31, 2021 for large left pleural effusion.  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  TEST/EVENTS :  01/26/2021: Chest x-ray Pleural effusion improved after thoracentesis. The patient's images have been independently reviewed by me.    CT scan chest July 2022: Extensive thoracic metastasis and large left-sided pleural effusion and pleural nodular metastasis.  PET scan on 01/18/2021 shows large right renal mass consistent with primary renal carcinoma, thoracic metastases, left lung lobe and pleural metastases invading the fifth rib, large left pleural effusion, right lung nodular metastases, bulky mediastinal paratracheal metastatic adenopathy, left suprarenal oblong mass, small subcutaneous nodule metastases superficial to the right gluteus muscle. MRI brain on 01/19/2021: Two cerebral metastases--15mm in left temporal lobe, 1mm in right temporal lobe  02/22/2021 Follow up : Pleural Effusion  Patient presents for a 3-week follow-up.  Patient was seen last visit for pulmonary consult for a large recurrent left pleural effusion.  Patient has known metastatic renal cell carcinoma with known brain mets and pleural base metastatic disease he is followed by medical and radiation oncology currently on chemotherapy.  He underwent multiple thoracentesis.  Cytology on January 02, 2021 showed reactive mesothelial cells. Patient was set up for a Pleurx drainage tube.  This was placed February 06, 2021.  Patient education was provided at tube placement for home drainage and Pleurx care.  Patient says he he has been doing fairly well with his Pleurx tube.  He is trying  to drain it every other day.  Says that when he for started draining that he got out a significant amount.  Over the last week is noticed that he does not get out a whole lot of clear fluid.  Has had no bloody fluid recently.  Has had no redness or drainage at the Pleurx site.  He has had no fever or increased cough.  Does feel that his breathing has been maybe slightly better. Here to get sutures removed .  He has upcoming chemo next week. Current treatment :: Ipilimumab 1 mg/kg with nivolumab 3 mg/kg to start on January 15, 2021             A. Zometa given on 01/15/2021 for hypercalcemia related to malignancy Stereotactic [facility]) given to patient with 20Gy to each lesion on 01/29/2021    No Known Allergies  Immunization History  Administered Date(s) Administered   Tdap 09/29/2017    Past Medical History:  Diagnosis Date   Cancer, metastatic to lung ([place])    Renal cell adenocarcinoma, left ([facility])     Tobacco History: Social History   Tobacco Use  Smoking Status Some Days   Packs/day: 0.50   Types: Cigarettes  Smokeless Tobacco Never   Ready to quit: Not Answered Counseling given: Not Answered   Outpatient Medications Prior to Visit  Medication Sig Dispense Refill   HYDROcodone bit-homatropine (HYCODAN) 5-1.5 MG/5ML syrup Take 5 mLs by mouth every 6 (six) hours as needed for cough. (Patient not taking: Reported on 02/22/2021) 120 mL 0   prochlorperazine (COMPAZINE) 10 MG tablet Take 1 tablet (10 mg total) by mouth every 6 (six) hours as needed for nausea or vomiting. (Patient  not taking: Reported on 02/22/2021) 30 tablet 0   No facility-administered medications prior to visit.     Review of Systems:   Constitutional:   No  weight loss, night sweats,  Fevers, chills,  +fatigue, or  lassitude.  HEENT:   No headaches,  Difficulty swallowing,  Tooth/dental problems, or  Sore throat,                No sneezing, itching, ear ache, nasal congestion, post nasal drip,    CV:  No chest pain,  Orthopnea, PND, swelling in lower extremities, anasarca, dizziness, palpitations, syncope.   GI  No heartburn, indigestion, abdominal pain, nausea, vomiting, diarrhea, change in bowel habits, loss of appetite, bloody stools.   Resp: .  No chest wall deformity  Skin: no rash or lesions.  GU: no dysuria, change in color of urine, no urgency or frequency.  No flank pain, no hematuria   MS:  No joint pain or swelling.  No decreased range of motion.  No back pain.    Physical Exam  BP 130/70 (BP Location: Left Arm, Patient Position: Sitting, Cuff Size: Normal)   Pulse (!) 105   Temp 98.1 F (36.7 C) (Oral)   Ht 5\' 9"  (1.753 m)   Wt 185 lb 6.4 oz (84.1 kg)   SpO2 97%   BMI 27.38 kg/m   GEN: A/Ox3; pleasant , NAD, well nourished    HEENT:  [place]/AT,   NOSE-clear, THROAT-clear, no lesions, no postnasal drip or exudate noted.   NECK:  Supple w/ fair ROM; no JVD; normal carotid impulses w/o bruits; no thyromegaly or nodules palpated; no lymphadenopathy.    RESP  decreased BS in bases  no accessory muscle use, no dullness to percussion Left pleurx catheter left lateral chest wall , insertion site clean and dry , no redness or drainage .   CARD:  RRR, no m/r/g, no peripheral edema, pulses intact, no cyanosis or clubbing.  GI:   Soft & nt; nml bowel sounds; no organomegaly or masses detected.   Musco: Warm bil, no deformities or joint swelling noted.   Neuro: alert, no focal deficits noted.    Skin: Warm, no lesions or rashes   Procedure : Left pleurx catheter in place , sutures x 2 removed with sterile procedure , tolerated well with no apparent complications .  Dressing applied.   Lab Results:  CBC    Component Value Date/Time   WBC 5.0 02/05/2021 1027   WBC 5.9 01/20/2021 1911   RBC 5.11 02/05/2021 1027   HGB 12.2 (L) 02/05/2021 1027   HCT 37.5 (L) 02/05/2021 1027   PLT 360 02/05/2021 1027   MCV 73.4 (L) 02/05/2021 1027   MCH 23.9 (L)  02/05/2021 1027   MCHC 32.5 02/05/2021 1027   RDW 14.3 02/05/2021 1027   LYMPHSABS 1.1 02/05/2021 1027   MONOABS 0.8 02/05/2021 1027   EOSABS 0.1 02/05/2021 1027   BASOSABS 0.1 02/05/2021 1027    BMET    Component Value Date/Time   NA 134 (L) 02/05/2021 1027   K 4.5 02/05/2021 1027   CL 104 02/05/2021 1027   CO2 22 02/05/2021 1027   GLUCOSE 106 (H) 02/05/2021 1027   BUN 11 02/05/2021 1027   CREATININE 0.79 02/05/2021 1027   CALCIUM 12.3 (H) 02/05/2021 1027   GFRNONAA >60 02/05/2021 1027    BNP No results found for: BNP  ProBNP No results found for: PROBNP  Imaging: DG Chest 1 View  Result Date: 01/26/2021 CLINICAL  DATA:  Status post left-sided thoracentesis. EXAM: CHEST  1 VIEW COMPARISON:  Chest x-ray dated January 20, 2021. FINDINGS: Decreased but still large left-sided pleural effusion with some improved aeration of the left upper lobe. Continued near complete collapse of the left lower lobe. No pneumothorax. Bilateral pulmonary nodules are unchanged. Normal heart size. No acute osseous abnormality. IMPRESSION: 1. Decreased but still large left pleural effusion with some improved aeration of the left upper lobe. No pneumothorax. Electronically Signed   By: Titus Dubin M.D.   On: 01/26/2021 13:03   DG CHEST PORT 1 VIEW  Result Date: 02/06/2021 CLINICAL DATA:  Left chest tube, effusion EXAM: PORTABLE CHEST 1 VIEW COMPARISON:  01/26/2021 FINDINGS: Single frontal view of the chest demonstrates a partially loculated left pleural effusion, with a small amount of gas in the fluid collection likely reflecting interval pleural drainage catheter placement. Overall slight decrease in size of the pleural fluid. Continued bilateral pulmonary nodules with dense left basilar consolidation unchanged. No acute bony abnormality. IMPRESSION: 1. Left-sided hydropneumothorax, decreased in size since prior study. Interval placement of indwelling pleural drainage catheter. 2. Stable dense left  basilar consolidation. 3. Multiple pulmonary nodules consistent with known metastases. Electronically Signed   By: Randa Ngo M.D.   On: 02/06/2021 16:22   US Thoracentesis Asp Pleural space w/IMG guide  Result Date: 01/26/2021 INDICATION: Patient with history of lung cancer, recurrent left pleural effusion. Request made for therapeutic thoracentesis. EXAM: ULTRASOUND GUIDED THERAPEUTIC LEFT THORACENTESIS MEDICATIONS: 10 mL 1% lidocaine COMPLICATIONS: None immediate. PROCEDURE: An ultrasound guided thoracentesis was thoroughly discussed with the patient and questions answered. The benefits, risks, alternatives and complications were also discussed. The patient understands and wishes to proceed with the procedure. Written consent was obtained. Ultrasound was performed to localize and mark an adequate pocket of fluid in the left chest. The area was then prepped and draped in the normal sterile fashion. 1% Lidocaine was used for local anesthesia. Under ultrasound guidance a 6 Fr Safe-T-Centesis catheter was introduced. Thoracentesis was performed. The catheter was removed and a dressing applied. FINDINGS: A total of approximately 1.2 liters of bloody fluid was removed. IMPRESSION: Successful ultrasound guided left thoracentesis yielding 1.2 liters of pleural fluid. Read by: Brynda Greathouse PA-C Electronically Signed   By: Jacqulynn Cadet M.D.   On: 01/26/2021 15:42    diphenhydrAMINE (BENADRYL) injection 25 mg     Date Action Dose Route User   Discharged on 02/06/2021   Admitted on 02/06/2021   Discharged on 01/25/2021   Admitted on 01/25/2021   Discharged on 01/20/2021   Admitted on 01/20/2021   01/15/2021 1002 Given 25 mg Intravenous Dierdre Harness E, RN      diphenhydrAMINE (BENADRYL) injection 25 mg     Date Action Dose Route User   Discharged on 02/06/2021   Admitted on 02/06/2021   02/05/2021 1214 Given 25 mg Intravenous Sinda Du, RN      famotidine (PEPCID) IVPB 20 mg in NS 100 mL IVPB      Date Action Dose Route User   Discharged on 02/06/2021   Admitted on 02/06/2021   Discharged on 01/25/2021   Admitted on 01/25/2021   Discharged on 01/20/2021   Admitted on 01/20/2021   01/15/2021 1026 Infusion Verify (none) Intravenous Priscille Loveless, RN   01/15/2021 1023 Infusion Verify (none) Intravenous Priscille Loveless, RN   01/15/2021 1008 Rate/Dose Change (none) Intravenous Priscille Loveless, RN   01/15/2021 1008 New Bag/Given 20 mg Intravenous Dierdre Harness  E, RN      famotidine (PEPCID) IVPB 20 mg in NS 100 mL IVPB     Date Action Dose Route User   Discharged on 02/06/2021   Admitted on 02/06/2021   02/05/2021 1217 Rate/Dose Change (none) Intravenous Regan Rakers, RN   02/05/2021 1217 New Bag/Given 20 mg Intravenous Otis Brace M, RN      ipilimumab (YERVOY) 100 mg in sodium chloride 0.9 % 50 mL chemo infusion     Date Action Dose Route User   Discharged on 02/06/2021   Admitted on 02/06/2021   Discharged on 01/25/2021   Admitted on 01/25/2021   Discharged on 01/20/2021   Admitted on 01/20/2021   01/15/2021 1223 Rate/Dose Change (none) Intravenous Priscille Loveless, RN   01/15/2021 1223 Rate/Dose Change (none) Intravenous Priscille Loveless, RN   01/15/2021 1153 Rate/Dose Change (none) Intravenous Priscille Loveless, RN   01/15/2021 1152 New Bag/Given 100 mg Intravenous Priscille Loveless, RN      ipilimumab (YERVOY) 85 mg in sodium chloride 0.9 % 50 mL chemo infusion     Date Action Dose Route User   Discharged on 02/06/2021   Admitted on 02/06/2021   02/05/2021 1408 Infusion Verify (none) Intravenous Regan Rakers, RN   02/05/2021 1408 Rate/Dose Change (none) Intravenous Regan Rakers, RN   02/05/2021 1407 New Bag/Given 85 mg Intravenous Otis Brace M, RN      nivolumab (OPDIVO) 280 mg in sodium chloride 0.9 % 100 mL chemo infusion     Date Action Dose Route User   Discharged on 02/06/2021   Admitted on 02/06/2021   Discharged on 01/25/2021   Admitted on 01/25/2021    Discharged on 01/20/2021   Admitted on 01/20/2021   01/15/2021 1113 Infusion Verify (none) Intravenous Priscille Loveless, RN   01/15/2021 1113 Rate/Dose Change (none) Intravenous Priscille Loveless, RN   01/15/2021 1113 New Bag/Given 280 mg Intravenous Dierdre Harness E, RN      nivolumab (OPDIVO) 240 mg in sodium chloride 0.9 % 100 mL chemo infusion     Date Action Dose Route User   Discharged on 02/06/2021   Admitted on 02/06/2021   02/05/2021 1317 Infusion Verify (none) Intravenous Regan Rakers, RN   02/05/2021 1317 Rate/Dose Change (none) Intravenous Regan Rakers, RN   02/05/2021 1317 New Bag/Given 240 mg Intravenous Sinda Du, RN      0.9 %  sodium chloride infusion     Date Action Dose Route User   Discharged on 02/06/2021   Admitted on 02/06/2021   Discharged on 01/25/2021   Admitted on 01/25/2021   Discharged on 01/20/2021   Admitted on 01/20/2021   01/15/2021 1226 Rate/Dose Change (none) Intravenous Priscille Loveless, RN   01/15/2021 1150 Rate/Dose Change (none) Intravenous Priscille Loveless, RN   01/15/2021 1145 Rate/Dose Change (none) Intravenous Priscille Loveless, RN   01/15/2021 1050 Infusion Verify (none) Intravenous Priscille Loveless, RN   01/15/2021 1049 Rate/Dose Change (none) Intravenous Priscille Loveless, RN      0.9 %  sodium chloride infusion     Date Action Dose Route User   Discharged on 02/06/2021   Admitted on 02/06/2021   02/05/2021 1437 Rate/Dose Change (none) Intravenous Regan Rakers, RN   02/05/2021 1352 Infusion Verify (none) Intravenous Regan Rakers, RN   02/05/2021 1352 Rate/Dose Change (none) Intravenous Regan Rakers, RN   02/05/2021 1347 Rate/Dose Change (none) Intravenous Chilcott, Delfin Gant, RN  02/05/2021 1317 Infusion Verify (none) Intravenous Chilcott, Delfin Gant, RN      Zoledronic Acid (ZOMETA) IVPB 4 mg     Date Action Dose Route User   Discharged on 02/06/2021   Admitted on 02/06/2021   Discharged on 01/25/2021   Admitted on 01/25/2021    Discharged on 01/20/2021   Admitted on 01/20/2021   01/15/2021 1031 Rate/Dose Change (none) Intravenous Priscille Loveless, RN   01/15/2021 1031 New Bag/Given 4 mg Intravenous Priscille Loveless, RN       No flowsheet data found.  No results found for: NITRICOXIDE      Assessment & Plan:   Pleural effusion on left Recurrent large left pleural effusion` presumed malignant requiring multiple thoracentesis now with Pleurx catheter.  Presumed malignant effusion- cytology is showed atypical cells. Continue with Pleurx drainage.  Will decrease to every 3 days.  Continue with Pleurx care.  Patient education given. Chest x-ray today. Continue follow-up with oncology as planned. Pleurx sutures were removed without difficulty.  Plan  Patient Instructions  Chest xray today .  Drain Pleurx every 3 days  Continue with Pleurx care  Report any signs of infection, drainage, redness, fever.  Follow up with Dr. Valeta Harms in 4-6 weeks and As needed   Please contact office for sooner follow up if symptoms do not improve or worsen or seek emergency care       Kidney cancer, primary, with metastasis from kidney to other site [facility] Continue with follow-up with oncology and treatment plan  Brain metastases [facility] Continue with follow-up with medical and radiation oncology   I spent  40  minutes dedicated to the care of this patient on the date of this encounter to include pre-visit review of records, face-to-face time with the patient discussing conditions above, post visit ordering of testing, clinical documentation with the electronic health record, making appropriate referrals as documented, and communicating necessary findings to members of the patients care team.    Rexene Edison, NP 02/22/2021

## 2021-02-22 NOTE — Progress Notes (Signed)
PCCM:  Thanks for seeing him   Garner Nash, DO [facility] 02/22/2021 5:44 PM

## 2021-02-22 NOTE — Patient Instructions (Addendum)
Chest xray today .  Drain Pleurx every 3 days  Continue with Pleurx care  Report any signs of infection, drainage, redness, fever.  Follow up with Dr. Valeta Harms in 4-6 weeks and As needed   Please contact office for sooner follow up if symptoms do not improve or worsen or seek emergency care

## 2021-02-23 ENCOUNTER — Ambulatory Visit (INDEPENDENT_AMBULATORY_CARE_PROVIDER_SITE_OTHER)
Admission: RE | Admit: 2021-02-23 | Discharge: 2021-02-23 | Disposition: A | Discharge status: Home / Self Care | Payer: No Typology Code available for payment source | Source: Ambulatory Visit | Attending: Adult Health | Admitting: Adult Health

## 2021-02-23 DIAGNOSIS — J90 Pleural effusion, not elsewhere classified: Secondary | ICD-10-CM

## 2021-02-23 IMAGING — DX DG CHEST 2V
2 series · 2 of 2 positions shown · non-contrast
Comparison: [DATE]

CLINICAL DATA: Follow-up left pleural effusion. Mild cough. Cancer,
metastatic to lung.

EXAM:
CHEST - 2 VIEW

[chest pa]
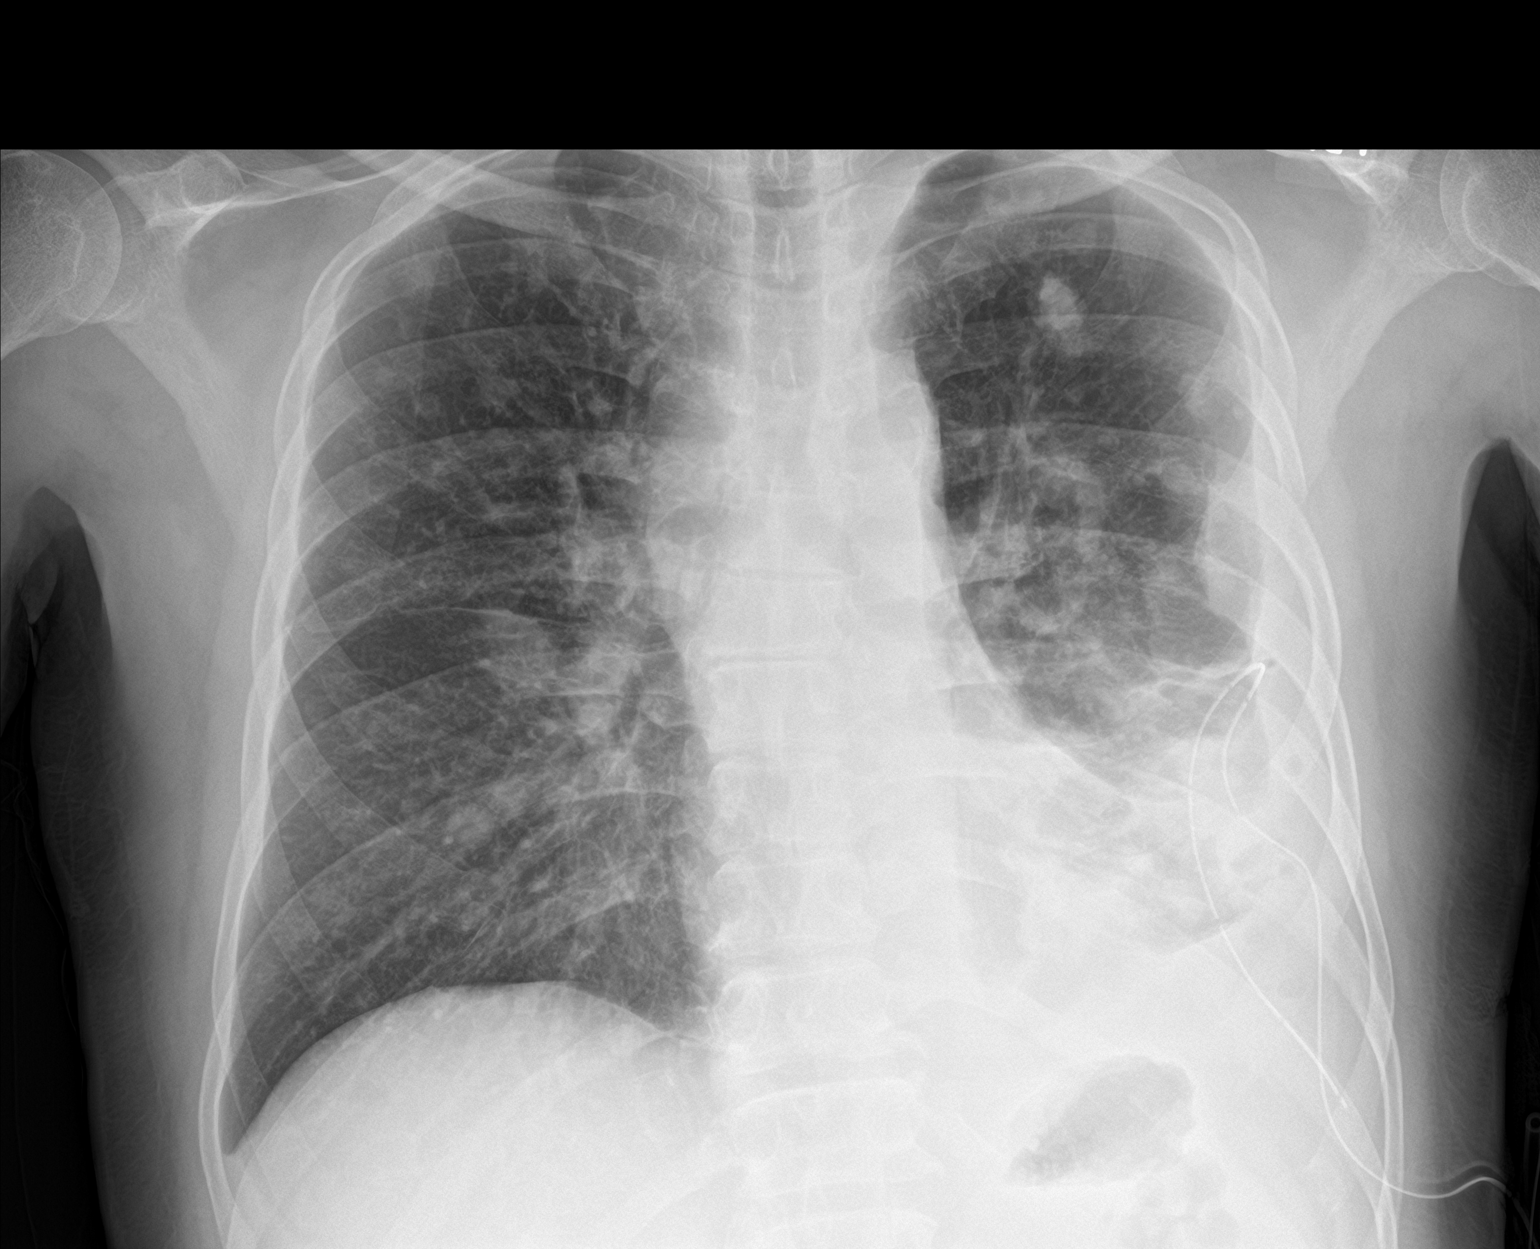

[chest lat]
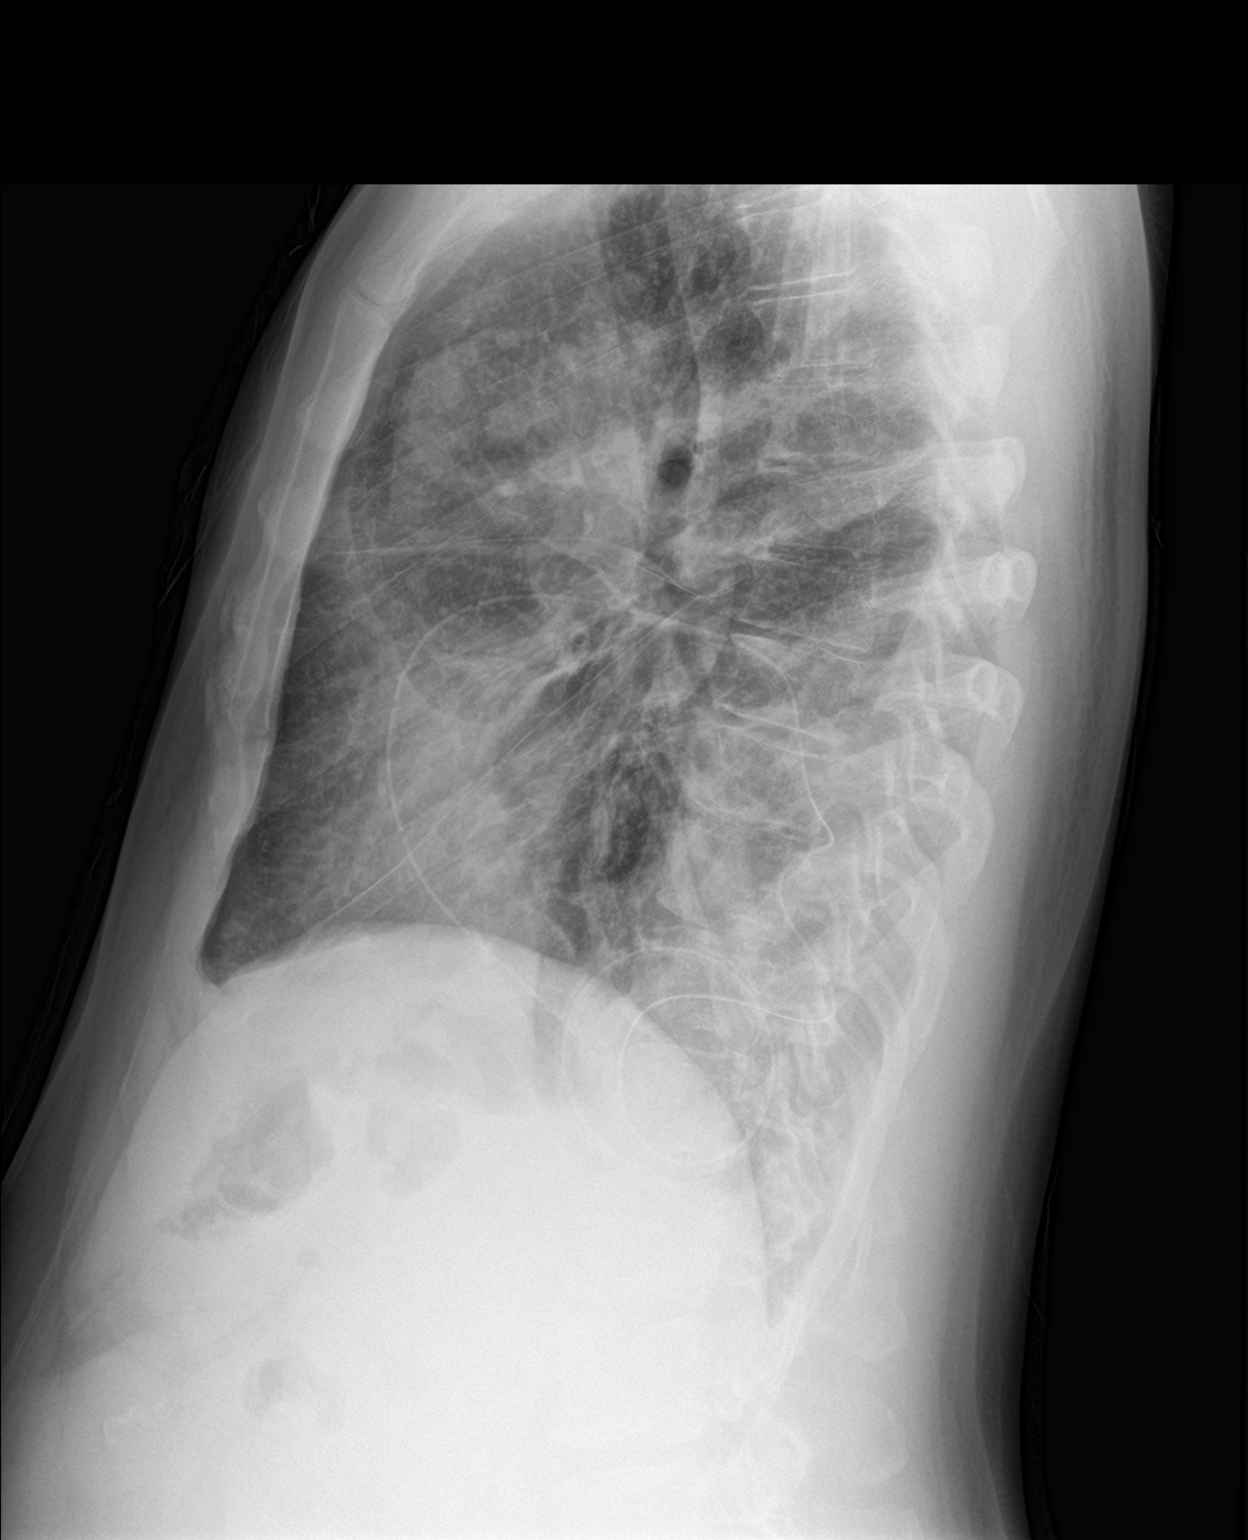

[2 of 2 positions shown; findings below may reference images not displayed]

FINDINGS: Again noted is fullness in the mediastinum compatible with known
lymphadenopathy. Scattered irregular nodular densities compatible
with known metastatic disease. Patient has a tunneled left pleural
catheter which is coiled in the left lower hemithorax. Slightly
decreased densities in the left lower chest but there is a residual
loculated or complex left pleural fluid or pleural disease. Residual
consolidation or airspace densities at the left lung base. There may
be trace left pleural air but majority of the pleural air has
resolved.
IMPRESSION: 1. Left pleural effusion/pleural densities have slightly decreased
since [DATE]. Residual pleural and parenchymal densities in left
chest are compatible with a loculated left pleural effusion and
residual atelectasis or consolidation at the left lung base.
2. Stable position of the tunneled left pleural catheter. Majority
of the left pleural air has resolved.
3. Evidence for metastatic disease demonstrated by multiple
irregular pulmonary nodules and mediastinal lymphadenopathy.

## 2021-02-26 ENCOUNTER — Inpatient Hospital Stay (HOSPITAL_BASED_OUTPATIENT_CLINIC_OR_DEPARTMENT_OTHER): Payer: PRIVATE HEALTH INSURANCE | Admitting: Oncology

## 2021-02-26 ENCOUNTER — Inpatient Hospital Stay: Payer: PRIVATE HEALTH INSURANCE

## 2021-02-26 ENCOUNTER — Other Ambulatory Visit: Payer: Self-pay

## 2021-02-26 VITALS — BP 136/82 | HR 106 | Temp 98.2°F | Resp 18 | Wt 184.1 lb

## 2021-02-26 DIAGNOSIS — N2889 Other specified disorders of kidney and ureter: Secondary | ICD-10-CM

## 2021-02-26 DIAGNOSIS — Z5112 Encounter for antineoplastic immunotherapy: Secondary | ICD-10-CM | POA: Diagnosis not present

## 2021-02-26 DIAGNOSIS — R918 Other nonspecific abnormal finding of lung field: Secondary | ICD-10-CM

## 2021-02-26 DIAGNOSIS — C641 Malignant neoplasm of right kidney, except renal pelvis: Secondary | ICD-10-CM

## 2021-02-26 DIAGNOSIS — J90 Pleural effusion, not elsewhere classified: Secondary | ICD-10-CM

## 2021-02-26 LAB — CBC WITH DIFFERENTIAL (CANCER CENTER ONLY)
Abs Immature Granulocytes: 0.02 10*3/uL (ref 0.00–0.07)
Basophils Absolute: 0 10*3/uL (ref 0.0–0.1)
Basophils Relative: 1 %
Eosinophils Absolute: 0.2 10*3/uL (ref 0.0–0.5)
Eosinophils Relative: 4 %
HCT: 35.9 % — ABNORMAL LOW (ref 39.0–52.0)
Hemoglobin: 11.4 g/dL — ABNORMAL LOW (ref 13.0–17.0)
Immature Granulocytes: 0 %
Lymphocytes Relative: 26 %
Lymphs Abs: 1.2 10*3/uL (ref 0.7–4.0)
MCH: 22.9 pg — ABNORMAL LOW (ref 26.0–34.0)
MCHC: 31.8 g/dL (ref 30.0–36.0)
MCV: 72.1 fL — ABNORMAL LOW (ref 80.0–100.0)
Monocytes Absolute: 0.6 10*3/uL (ref 0.1–1.0)
Monocytes Relative: 12 %
Neutro Abs: 2.6 10*3/uL (ref 1.7–7.7)
Neutrophils Relative %: 57 %
Platelet Count: 321 10*3/uL (ref 150–400)
RBC: 4.98 MIL/uL (ref 4.22–5.81)
RDW: 14.8 % (ref 11.5–15.5)
WBC Count: 4.6 10*3/uL (ref 4.0–10.5)
nRBC: 0 % (ref 0.0–0.2)

## 2021-02-26 LAB — CMP (CANCER CENTER ONLY)
ALT: 22 U/L (ref 0–44)
AST: 14 U/L — ABNORMAL LOW (ref 15–41)
Albumin: 3.4 g/dL — ABNORMAL LOW (ref 3.5–5.0)
Alkaline Phosphatase: 75 U/L (ref 38–126)
Anion gap: 9 (ref 5–15)
BUN: 10 mg/dL (ref 6–20)
CO2: 21 mmol/L — ABNORMAL LOW (ref 22–32)
Calcium: 12.9 mg/dL — ABNORMAL HIGH (ref 8.9–10.3)
Chloride: 105 mmol/L (ref 98–111)
Creatinine: 0.84 mg/dL (ref 0.61–1.24)
GFR, Estimated: >60 mL/min (ref >60)
Glucose, Bld: 100 mg/dL — ABNORMAL HIGH (ref 70–99)
Potassium: 4.2 mmol/L (ref 3.5–5.1)
Sodium: 135 mmol/L (ref 135–145)
Total Bilirubin: 0.5 mg/dL (ref 0.3–1.2)
Total Protein: 7.6 g/dL (ref 6.5–8.1)

## 2021-02-26 MED ORDER — ZOLEDRONIC ACID 4 MG/100ML IV SOLN
4.0000 mg | Freq: Once | INTRAVENOUS | Status: AC
  Administered 2021-02-26: 4 mg via INTRAVENOUS
  Filled 2021-02-26: qty 100

## 2021-02-26 MED ORDER — SODIUM CHLORIDE 0.9 % IV SOLN
85.0000 mg | Freq: Once | INTRAVENOUS | Status: AC
  Administered 2021-02-26: 85 mg via INTRAVENOUS
  Filled 2021-02-26: qty 17

## 2021-02-26 MED ORDER — SODIUM CHLORIDE 0.9 % IV SOLN
240.0000 mg | Freq: Once | INTRAVENOUS | Status: AC
  Administered 2021-02-26: 240 mg via INTRAVENOUS
  Filled 2021-02-26: qty 24

## 2021-02-26 MED ORDER — FAMOTIDINE 20 MG IN NS 100 ML IVPB
20.0000 mg | Freq: Once | INTRAVENOUS | Status: AC
  Administered 2021-02-26: 20 mg via INTRAVENOUS
  Filled 2021-02-26: qty 100

## 2021-02-26 MED ORDER — SODIUM CHLORIDE 0.9 % IV SOLN: Freq: Once | INTRAVENOUS | Status: AC

## 2021-02-26 MED ORDER — DIPHENHYDRAMINE HCL 50 MG/ML IJ SOLN
25.0000 mg | Freq: Once | INTRAMUSCULAR | Status: AC
  Administered 2021-02-26: 25 mg via INTRAVENOUS
  Filled 2021-02-26: qty 1

## 2021-02-26 NOTE — Progress Notes (Signed)
Hematology and Oncology Follow Up Visit  Dylan Good [id number] Jan 08, 1971 50 y.o. 02/26/2021 7:55 AM Patient, No Pcp Per (Inactive)No ref. provider found   Principle Diagnosis: 50 year old man with kidney cancer diagnosed in June 2022.  He presented with stage IV disease including bilateral pulmonary nodules and pleural effusion and CNS metastasis.   Prior Therapy:   He is status post thoracentesis completed in June 2022 and repeated on January 02, 2021.  He is s/p Pleurx catheter insertion in August 2022.  He is status post stereotactic radiosurgery to the brain completed on January 29, 2021.  He received 20 Gray in 1 fraction.  Current therapy: Ipilimumab 1 mg/kg with nivolumab 3 mg/kg started on January 15, 2021.  He is here for cycle 3 of therapy.  Interim History: Dylan Good is here for repeat evaluation.  Since the last visit, he reports feeling well without any major complaints.  He reports mild decrease in his appetite but still able to eat although has lost 2 pounds in the last month.  He had denies any worsening shortness of breath or difficulty breathing.  He does report intermittent mild nonproductive cough.  Denies any nausea, diarrhea or skin rash.  His performance status quality of life remained reasonable.    Medications: Reviewed and updated.     Allergies: No Known Allergies    Physical Exam: Blood pressure 136/82, pulse (!) 106, temperature 98.2 F (36.8 C), temperature source Oral, resp. rate 18, weight 184 lb 1.6 oz (83.5 kg), SpO2 100 %.  ECOG: 1    General appearance: Alert, awake without any distress. Head: Atraumatic without abnormalities Oropharynx: Without any thrush or ulcers. Eyes: No scleral icterus. Lymph nodes: No lymphadenopathy noted in the cervical, supraclavicular, or axillary nodes Heart:regular rate and rhythm, without any murmurs or gallops.   Lung: Clear to auscultation without any rhonchi, wheezes or dullness to percussion. Abdomin:  Soft, nontender without any shifting dullness or ascites. Musculoskeletal: No clubbing or cyanosis. Neurological: No motor or sensory deficits. Skin: No rashes or lesions.       Lab Results: Lab Results  Component Value Date   WBC 5.0 02/05/2021   HGB 12.2 (L) 02/05/2021   HCT 37.5 (L) 02/05/2021   MCV 73.4 (L) 02/05/2021   PLT 360 02/05/2021     Chemistry      Component Value Date/Time   NA 134 (L) 02/05/2021 1027   K 4.5 02/05/2021 1027   CL 104 02/05/2021 1027   CO2 22 02/05/2021 1027   BUN 11 02/05/2021 1027   CREATININE 0.79 02/05/2021 1027      Component Value Date/Time   CALCIUM 12.3 (H) 02/05/2021 1027   ALKPHOS 88 02/05/2021 1027   AST 48 (H) 02/05/2021 1027   ALT 101 (H) 02/05/2021 1027   BILITOT 1.0 02/05/2021 1027         Impression and Plan:   50 year old man with:  1.  Kidney cancer presented with a large kidney mass as well as pulmonary and CNS metastasis in June 2022.   He is currently receiving ipilimumab and nivolumab and completed 2 cycles of therapy.  Risks and benefits of proceeding with cycle 3 were discussed today.  After completing cycle 4 of therapy we will update his staging scans.  He is agreeable to proceed at this time.  I still favor obtaining tissue biopsy to complete his work-up which we will arrange for in the near future.  Obtaining tissue biopsy will allow [place] to determine the histology  of his kidney cancer and also plan for future therapies.  He understands this and will arrange for that in the near future.   2.  Pleural effusion: Related to malignancy with a Pleurx catheter in place.   3.  Hypercalcemia: Related to malignancy and received Zometa in July 2022.  This will be repeated today pending his calcium level.  4.  CNS metastasis: He is status post radiation therapy completed in July 2022.  No residual issues noted.  5.  Antiemetics: No nausea or vomiting reported at this time.  Compazine is available to him.  6.   Goals of care and prognosis: Therapy remains palliative although aggressive measures are warranted given his excellent performance status.     7.  Follow-up: He will return in 3 weeks for repeat evaluation for the next cycle of therapy.   30  minutes were dedicated to this visit.  The time spent on reviewing his disease status, discussing treatment choices and outlining future plan of care.  Zola Button, MD 8/22/20227:55 AM

## 2021-02-26 NOTE — Progress Notes (Signed)

## 2021-02-26 NOTE — Progress Notes (Signed)
ATC x1, LVM to return call regarding test results.

## 2021-02-26 NOTE — Patient Instructions (Signed)
[facility]   Discharge Instructions: Thank you for choosing [facility] to provide your oncology and hematology care.   If you have a lab appointment with the [facility], please go directly to the [facility] and check in at the registration area.   Wear comfortable clothing and clothing appropriate for easy access to any Portacath or PICC line.   We strive to give you quality time with your provider. You may need to reschedule your appointment if you arrive late (15 or more minutes).  Arriving late affects you and other patients whose appointments are after yours.  Also, if you miss three or more appointments without notifying the office, you may be dismissed from the clinic at the provider's discretion.      For prescription refill requests, have your pharmacy contact our office and allow 72 hours for refills to be completed.    Today you received the following chemotherapy and/or immunotherapy agents: ipilimumab and nivolumab.      To help prevent nausea and vomiting after your treatment, we encourage you to take your nausea medication as directed.  BELOW ARE SYMPTOMS THAT SHOULD BE REPORTED IMMEDIATELY: *FEVER GREATER THAN 100.4 F (38 C) OR HIGHER *CHILLS OR SWEATING *NAUSEA AND VOMITING THAT IS NOT CONTROLLED WITH YOUR NAUSEA MEDICATION *UNUSUAL SHORTNESS OF BREATH *UNUSUAL BRUISING OR BLEEDING *URINARY PROBLEMS (pain or burning when urinating, or frequent urination) *BOWEL PROBLEMS (unusual diarrhea, constipation, pain near the anus) TENDERNESS IN MOUTH AND THROAT WITH OR WITHOUT PRESENCE OF ULCERS (sore throat, sores in mouth, or a toothache) UNUSUAL RASH, SWELLING OR PAIN  UNUSUAL VAGINAL DISCHARGE OR ITCHING   Items with * indicate a potential emergency and should be followed up as soon as possible or go to the Emergency Department if any problems should occur.  Please show the CHEMOTHERAPY ALERT CARD or IMMUNOTHERAPY ALERT  CARD at check-in to the Emergency Department and triage nurse.  Should you have questions after your visit or need to cancel or reschedule your appointment, please contact [facility]  Dept: [phone]  and follow the prompts.  Office hours are 8:00 a.m. to 4:30 p.m. Monday - Friday. Please note that voicemails left after 4:00 p.m. may not be returned until the following business day.  We are closed weekends and major holidays. You have access to a nurse at all times for urgent questions. Please call the main number to the clinic Dept: [phone] and follow the prompts.   For any non-urgent questions, you may also contact your provider using MyChart. We now offer e-Visits for anyone 48 and older to request care online for non-urgent symptoms. For details visit [email or web address].   Also download the MyChart app! Go to the app store, search "MyChart", open the app, select [facility], and log in with your MyChart username and password.  Due to Covid, a mask is required upon entering the hospital/clinic. If you do not have a mask, one will be given to you upon arrival. For doctor visits, patients may have 1 support person aged 35 or older with them. For treatment visits, patients cannot have anyone with them due to current Covid guidelines and our immunocompromised population.

## 2021-03-01 NOTE — Progress Notes (Addendum)
Mr. Fullilove presents today for follow-up after completing [place] treatment to his brain on 01/29/2021  Dose of Decadron, if applicable: Not curerntly prescribed   Recent neurologic symptoms, if any:  Seizures: Patient denies Headaches: Patient denies Nausea: Patient denies--reports a healthy appetite Wt Readings from Last 3 Encounters:  03/02/21 185 lb 8 oz (84.1 kg)  02/26/21 184 lb 1.6 oz (83.5 kg)  02/22/21 185 lb 6.4 oz (84.1 kg)   Dizziness/ataxia: Patient denies Difficulty with hand coordination: Patient denies Focal numbness/weakness: Patient denies Visual deficits/changes: Patient denies Confusion/Memory deficits: Patient denies any new or worsening memory concerns  Additional Complaints / other details: Reports sleeping well and denies lingering fatigue. Denies any changes in bowel or urinary habits. Overall reports he feels good and is doing well. Appears to be scheduled for a renal biopsy on 03/06/2021 and then F/U with his medical oncologist [name] on 03/19/2021  Vitals:   03/02/21 1143  BP: (!) 147/77  Pulse: (!) 111  Resp: 18  Temp: 98.4 F (36.9 C)  SpO2: 99%

## 2021-03-02 ENCOUNTER — Ambulatory Visit
Admission: RE | Admit: 2021-03-02 | Discharge: 2021-03-02 | Disposition: A | Discharge status: Home / Self Care | Payer: No Typology Code available for payment source | Source: Ambulatory Visit | Attending: Radiation Oncology | Admitting: Radiation Oncology

## 2021-03-02 ENCOUNTER — Encounter: Payer: Self-pay | Admitting: *Deleted

## 2021-03-02 ENCOUNTER — Encounter: Payer: Self-pay | Admitting: Radiation Oncology

## 2021-03-02 ENCOUNTER — Other Ambulatory Visit: Payer: Self-pay

## 2021-03-02 VITALS — BP 147/77 | HR 111 | Temp 98.4°F | Resp 18 | Ht 69.0 in | Wt 185.5 lb

## 2021-03-02 DIAGNOSIS — J90 Pleural effusion, not elsewhere classified: Secondary | ICD-10-CM | POA: Diagnosis not present

## 2021-03-02 DIAGNOSIS — R059 Cough, unspecified: Secondary | ICD-10-CM | POA: Diagnosis not present

## 2021-03-02 DIAGNOSIS — C7931 Secondary malignant neoplasm of brain: Secondary | ICD-10-CM | POA: Diagnosis not present

## 2021-03-02 DIAGNOSIS — C801 Malignant (primary) neoplasm, unspecified: Secondary | ICD-10-CM | POA: Diagnosis not present

## 2021-03-02 DIAGNOSIS — C7802 Secondary malignant neoplasm of left lung: Secondary | ICD-10-CM | POA: Insufficient documentation

## 2021-03-02 DIAGNOSIS — Z923 Personal history of irradiation: Secondary | ICD-10-CM | POA: Diagnosis not present

## 2021-03-02 NOTE — Progress Notes (Signed)
ATC x2.  Letter sent.  Closing encounter per protocol.

## 2021-03-02 NOTE — Progress Notes (Signed)
Radiation Oncology         [phone] ________________________________  Name: Dylan Good MRN: [id number]  Date: 03/02/2021  DOB: [birth year]  Follow-Up Visit Note  Outpatient  CC: Patient, No Pcp Per (Inactive)  Alen Blew [name], MD  Diagnosis and Prior Radiotherapy:    ICD-10-CM   1. Brain metastases ([place])  C79.31       CHIEF COMPLAINT: Here for follow-up and surveillance of brain mets  Narrative:  The patient returns today for routine follow-up.  He is doing better overall.  His thoracic pain has improved ever since he had a chest tube placed by pulmonology.  He reports that it is draining minimally now.  He denies any new neurologic issues.  No headaches or ataxia or nausea.  He has a healthy appetite.  No seizures.  He is following with medical oncology closely and anticipates a renal biopsy next week                              ALLERGIES:  has No Known Allergies.  Meds: No current outpatient medications on file.   No current facility-administered medications for this encounter.    Physical Findings:  Today's Vitals   03/02/21 1143  BP: (!) 147/77  Pulse: (!) 111  Resp: 18  Temp: 98.4 F (36.9 C)  SpO2: 99%  Weight: 185 lb 8 oz (84.1 kg)  Height: 5\' 9"  (1.753 m)  PainSc: 0-No pain   Body mass index is 27.39 kg/m.  Gen: The patient is in no acute distress. Patient is alert and oriented. Heart regular in rate and rhythm not tachycardic on auscultation Chest notable for decreased breath sounds on the left Neuro: Extraocular movements are intact, coordination is grossly intact.  Strength is grossly symmetric and intact.  Speech is fluent. Abdomen has normoactive bowel sounds and is nontender Psych: Insight and judgment are intact.  Pleasant affect  Lab Findings: Lab Results  Component Value Date   WBC 4.6 02/26/2021   HGB 11.4 (L) 02/26/2021   HCT 35.9 (L) 02/26/2021   MCV 72.1 (L) 02/26/2021   PLT 321 02/26/2021    Radiographic Findings: DG Chest  2 View  Result Date: 02/23/2021 CLINICAL DATA:  Follow-up left pleural effusion. Mild cough. Cancer, metastatic to lung. EXAM: CHEST - 2 VIEW COMPARISON:  02/06/2021 FINDINGS: Again noted is fullness in the mediastinum compatible with known lymphadenopathy. Scattered irregular nodular densities compatible with known metastatic disease. Patient has a tunneled left pleural catheter which is coiled in the left lower hemithorax. Slightly decreased densities in the left lower chest but there is a residual loculated or complex left pleural fluid or pleural disease. Residual consolidation or airspace densities at the left lung base. There may be trace left pleural air but majority of the pleural air has resolved. IMPRESSION: 1. Left pleural effusion/pleural densities have slightly decreased since 02/06/2001. Residual pleural and parenchymal densities in left chest are compatible with a loculated left pleural effusion and residual atelectasis or consolidation at the left lung base. 2. Stable position of the tunneled left pleural catheter. Majority of the left pleural air has resolved. 3. Evidence for metastatic disease demonstrated by multiple irregular pulmonary nodules and mediastinal lymphadenopathy. Electronically Signed   By: Markus Daft M.D.   On: 02/23/2021 09:44   DG CHEST PORT 1 VIEW  Result Date: 02/06/2021 CLINICAL DATA:  Left chest tube, effusion EXAM: PORTABLE CHEST 1 VIEW COMPARISON:  01/26/2021 FINDINGS:  Single frontal view of the chest demonstrates a partially loculated left pleural effusion, with a small amount of gas in the fluid collection likely reflecting interval pleural drainage catheter placement. Overall slight decrease in size of the pleural fluid. Continued bilateral pulmonary nodules with dense left basilar consolidation unchanged. No acute bony abnormality. IMPRESSION: 1. Left-sided hydropneumothorax, decreased in size since prior study. Interval placement of indwelling pleural drainage  catheter. 2. Stable dense left basilar consolidation. 3. Multiple pulmonary nodules consistent with known metastases. Electronically Signed   By: Randa Ngo M.D.   On: 02/06/2021 16:22    Impression/Plan:   Doing well symptomatically after stereotactic radiosurgery to the brain.  He will follow-up in 2 months with neurosurgery after restaging MRI of the brain.  I will see him back for the subsequent MRI.  Our navigator will arrange.  The patient and his significant other are pleased with this plan.  On date of service, in total, I spent 20 minutes on this encounter. Patient was seen in person.  _____________________________________   Eppie Sinko, MD

## 2021-03-05 ENCOUNTER — Other Ambulatory Visit: Payer: Self-pay | Admitting: Radiology

## 2021-03-06 ENCOUNTER — Ambulatory Visit (HOSPITAL_COMMUNITY)
Admission: RE | Admit: 2021-03-06 | Discharge: 2021-03-06 | Disposition: A | Discharge status: Home / Self Care | Payer: PRIVATE HEALTH INSURANCE | Source: Ambulatory Visit | Attending: Oncology | Admitting: Oncology

## 2021-03-06 ENCOUNTER — Other Ambulatory Visit: Payer: Self-pay

## 2021-03-06 ENCOUNTER — Encounter (HOSPITAL_COMMUNITY): Payer: Self-pay

## 2021-03-06 DIAGNOSIS — J90 Pleural effusion, not elsewhere classified: Secondary | ICD-10-CM

## 2021-03-06 DIAGNOSIS — C7801 Secondary malignant neoplasm of right lung: Secondary | ICD-10-CM | POA: Insufficient documentation

## 2021-03-06 DIAGNOSIS — C641 Malignant neoplasm of right kidney, except renal pelvis: Secondary | ICD-10-CM | POA: Insufficient documentation

## 2021-03-06 DIAGNOSIS — C7802 Secondary malignant neoplasm of left lung: Secondary | ICD-10-CM | POA: Insufficient documentation

## 2021-03-06 DIAGNOSIS — N2889 Other specified disorders of kidney and ureter: Secondary | ICD-10-CM

## 2021-03-06 DIAGNOSIS — C782 Secondary malignant neoplasm of pleura: Secondary | ICD-10-CM | POA: Diagnosis not present

## 2021-03-06 DIAGNOSIS — F17210 Nicotine dependence, cigarettes, uncomplicated: Secondary | ICD-10-CM | POA: Insufficient documentation

## 2021-03-06 DIAGNOSIS — R918 Other nonspecific abnormal finding of lung field: Secondary | ICD-10-CM

## 2021-03-06 LAB — CBC
HCT: 37.2 % — ABNORMAL LOW (ref 39.0–52.0)
Hemoglobin: 11.5 g/dL — ABNORMAL LOW (ref 13.0–17.0)
MCH: 22.9 pg — ABNORMAL LOW (ref 26.0–34.0)
MCHC: 30.9 g/dL (ref 30.0–36.0)
MCV: 74.1 fL — ABNORMAL LOW (ref 80.0–100.0)
Platelets: 319 10*3/uL (ref 150–400)
RBC: 5.02 MIL/uL (ref 4.22–5.81)
RDW: 15.6 % — ABNORMAL HIGH (ref 11.5–15.5)
WBC: 5.6 10*3/uL (ref 4.0–10.5)
nRBC: 0 % (ref 0.0–0.2)

## 2021-03-06 LAB — PROTIME-INR
INR: 1.1 (ref 0.8–1.2)
Prothrombin Time: 14.2 seconds (ref 11.4–15.2)

## 2021-03-06 IMAGING — CT CT BIOPSY CORE RENAL
1 of 4 series · 12 of 32 positions shown, 18 images · non-contrast
Comparison: none

INDICATION: 50-year-old gentleman with metastatic renal malignancy presents to
IR for biopsy of right renal mass.

[Series 2: i-spiral 5.0 b40f · axial · 0.95mm/px · z∈[+968,+1136]mm · 12 of 58 slices shown, 18 images]
[im 5/58  soft-tissue]
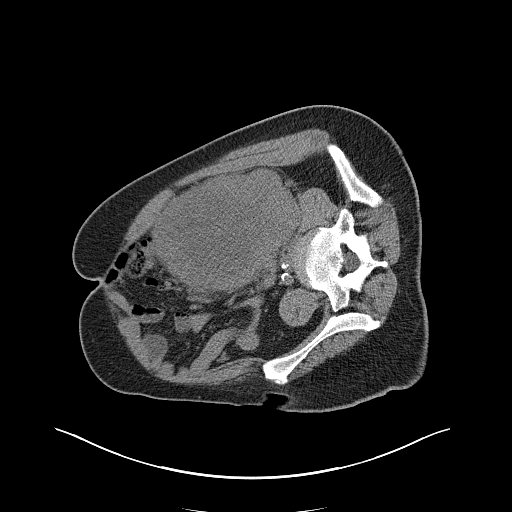
[im 5/58  bone]
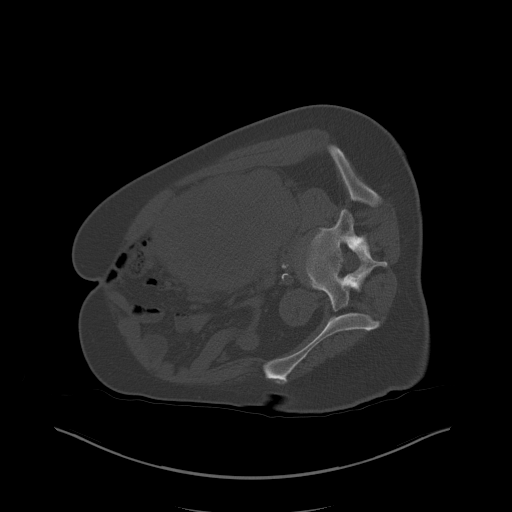
[im 9/58  soft-tissue]
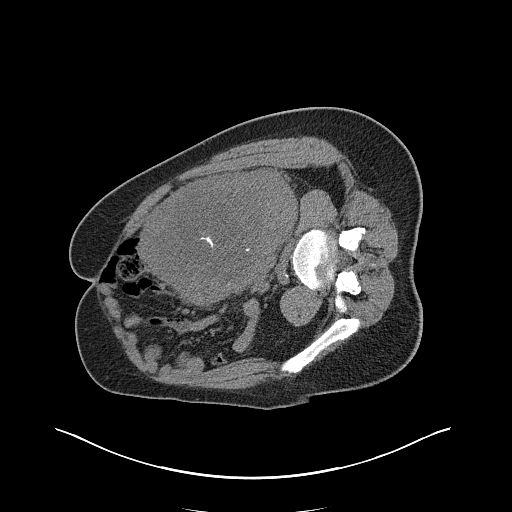
[im 14/58  soft-tissue]
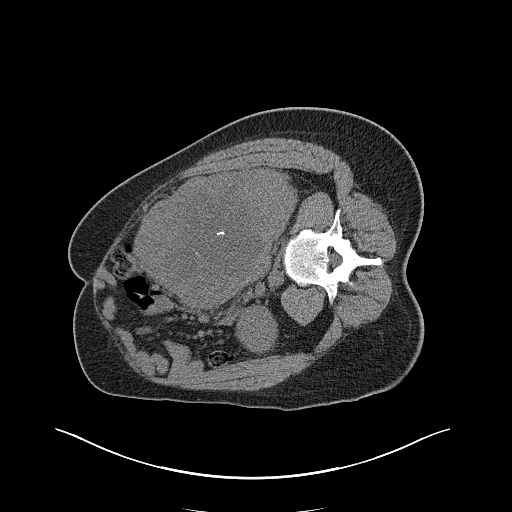
[im 18/58  soft-tissue]
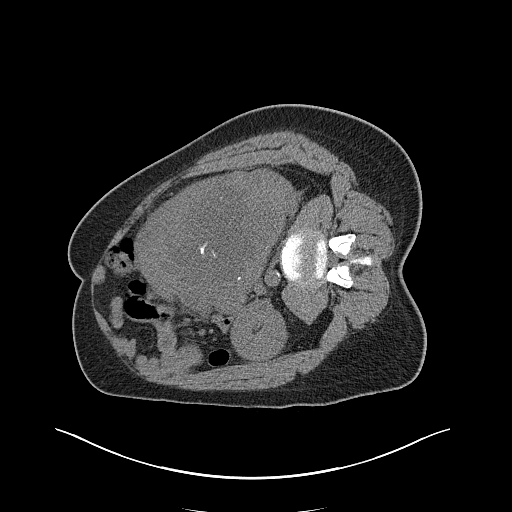
[im 22/58  soft-tissue]
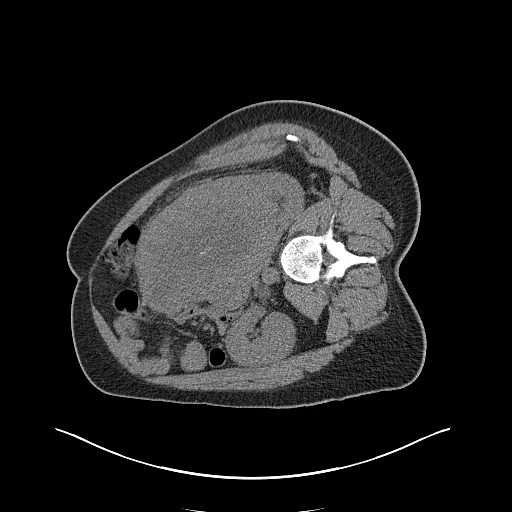
[im 27/58  soft-tissue]
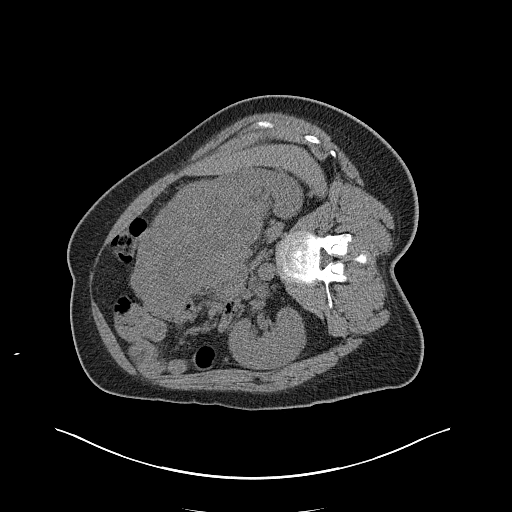
[im 31/58  soft-tissue]
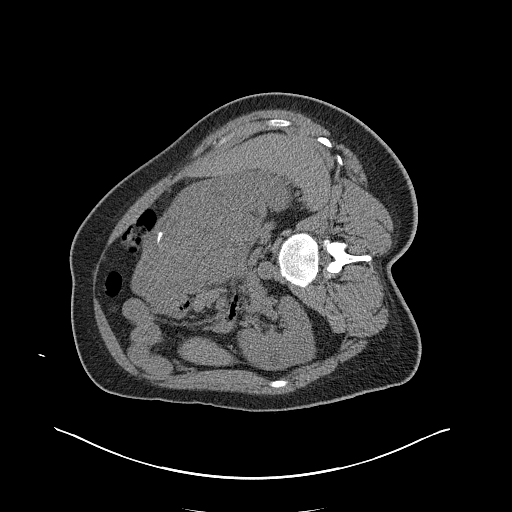
[im 36/58  soft-tissue]
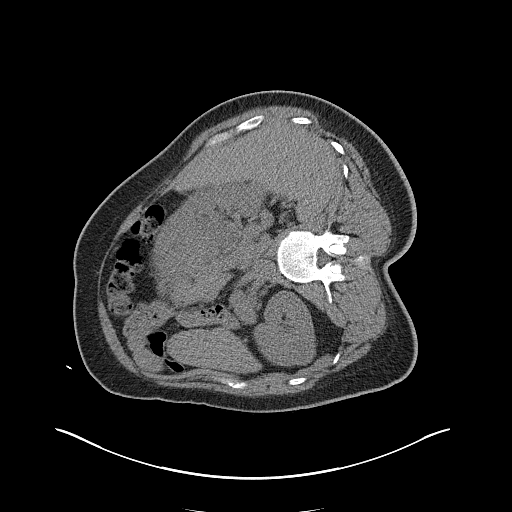
[im 40/58  soft-tissue]
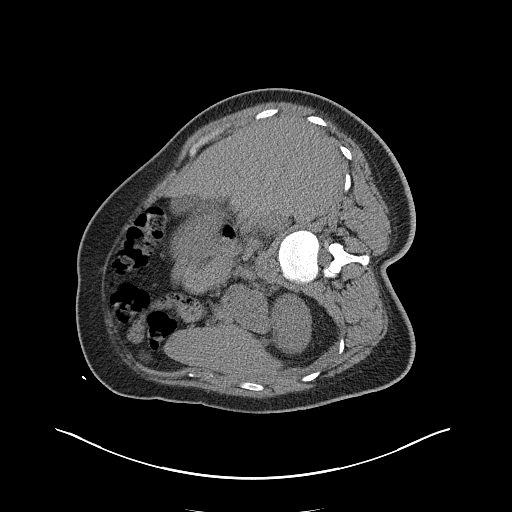
[im 40/58  lung]
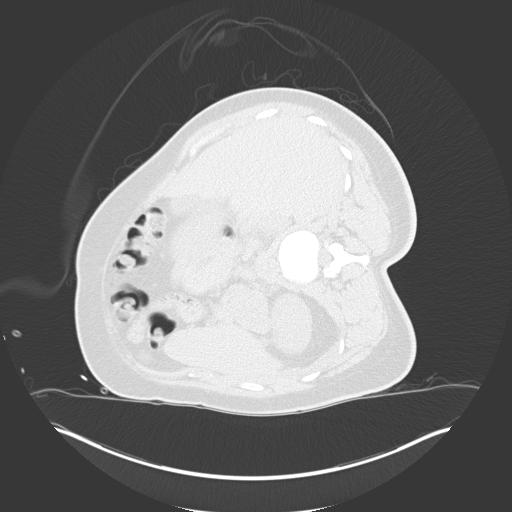
[im 40/58  bone]
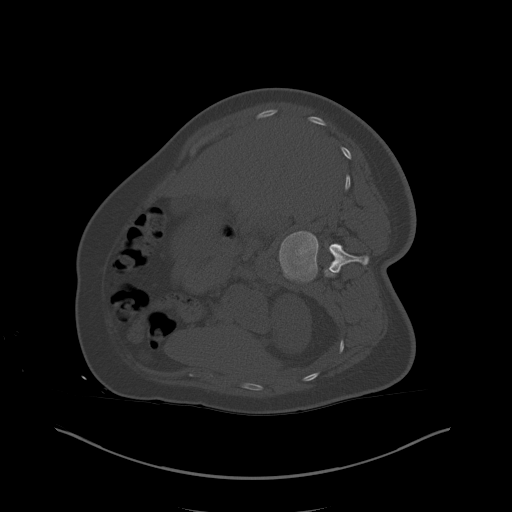
[im 44/58  soft-tissue]
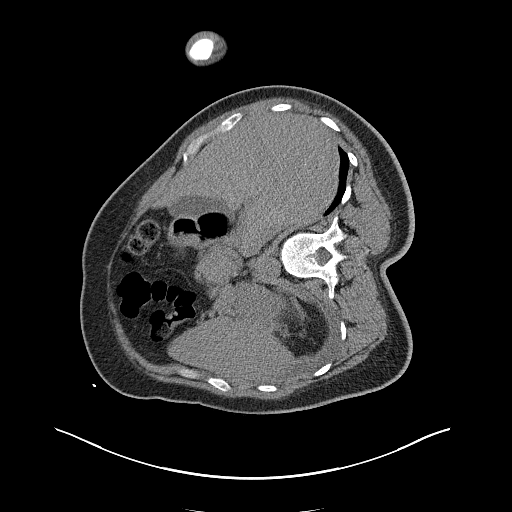
[im 44/58  lung]
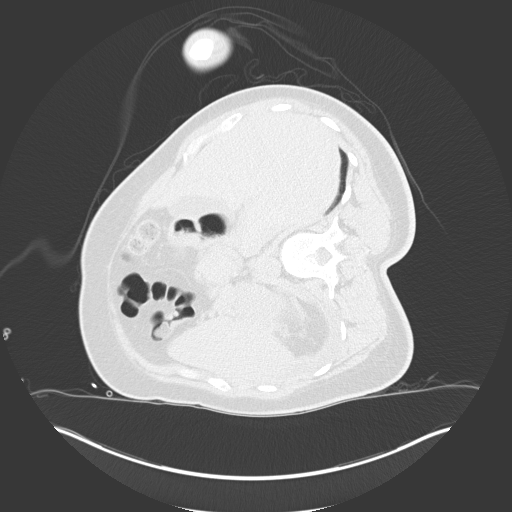
[im 49/58  soft-tissue]
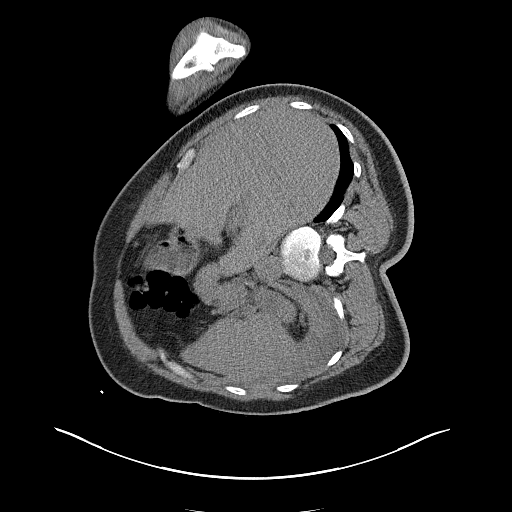
[im 49/58  lung]
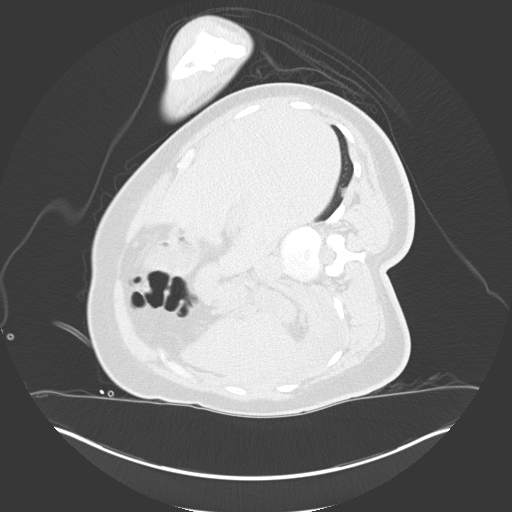
[im 53/58  soft-tissue]
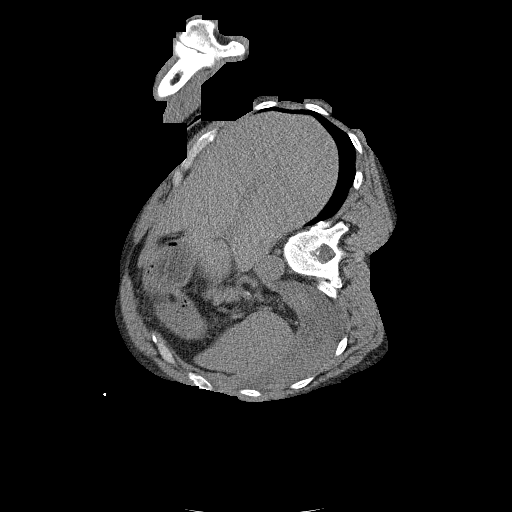
[im 53/58  lung]
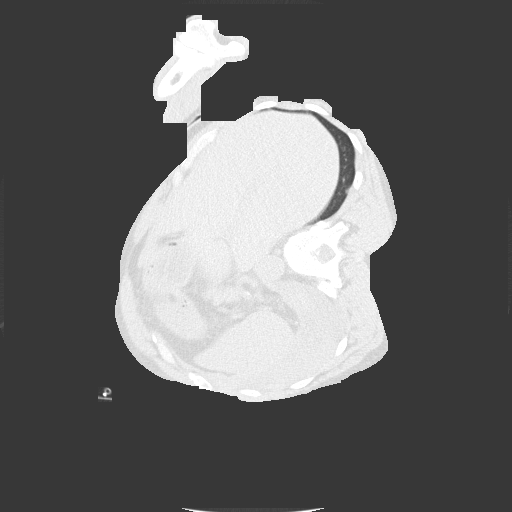

[12 of 32 positions shown; findings below may reference images not displayed]

EXAM:
CT-guided biopsy right renal mass.

MEDICATIONS:
None.

ANESTHESIA/SEDATION:
Moderate (conscious) sedation was employed during this procedure. A
total of Versed 1.5 mg and Fentanyl 75 mcg was administered
intravenously.

Moderate Sedation Time: 14 minutes. The patient's level of
consciousness and vital signs were monitored continuously by
radiology nursing throughout the procedure under my direct
supervision.

COMPLICATIONS:
None immediate.

PROCEDURE:
Informed written consent was obtained from the patient after a
thorough discussion of the procedural risks, benefits and
alternatives. All questions were addressed. Maximal Sterile Barrier
Technique was utilized including caps, mask, sterile gowns, sterile
gloves, sterile drape, hand hygiene and skin antiseptic. A timeout
was performed prior to the initiation of the procedure.

Patient position left lateral decubitus on the CT table. The right
lateral abdominal wall skin prepped and draped in usual fashion.
Following local lidocaine administration, 17 gauge introducer needle
was advanced into the FDG avid posterior wall of the right renal
mass. 4-18 gauge cores were obtained and sent to pathology in
formalin.
IMPRESSION: CT-guided biopsy of right renal mass as above.

## 2021-03-06 MED ORDER — MIDAZOLAM HCL 2 MG/2ML IJ SOLN
INTRAMUSCULAR | Status: AC
  Filled 2021-03-06: qty 4

## 2021-03-06 MED ORDER — SODIUM CHLORIDE 0.9 % IV SOLN: INTRAVENOUS | Status: DC

## 2021-03-06 MED ORDER — MIDAZOLAM HCL 2 MG/2ML IJ SOLN
INTRAMUSCULAR | Status: AC | PRN
  Administered 2021-03-06: 1 mg via INTRAVENOUS
  Administered 2021-03-06: 0.5 mg via INTRAVENOUS

## 2021-03-06 MED ORDER — FENTANYL CITRATE (PF) 100 MCG/2ML IJ SOLN
INTRAMUSCULAR | Status: AC | PRN
  Administered 2021-03-06: 50 ug via INTRAVENOUS
  Administered 2021-03-06: 25 ug via INTRAVENOUS

## 2021-03-06 MED ORDER — FENTANYL CITRATE (PF) 100 MCG/2ML IJ SOLN
INTRAMUSCULAR | Status: AC
  Filled 2021-03-06: qty 4

## 2021-03-06 MED ORDER — LIDOCAINE HCL 1 % IJ SOLN
INTRAMUSCULAR | Status: AC
  Filled 2021-03-06: qty 10

## 2021-03-06 NOTE — Sedation Documentation (Signed)
Report called to Casilda Carls RN. Vitals stable. PT has no complaints at this time. Pt laying on right side on rolled blanket with pressure to right lower side biopsy site. Site is unremarkable.

## 2021-03-06 NOTE — Progress Notes (Signed)
Patient was given discharge instructions. He verbalized understanding. 

## 2021-03-06 NOTE — H&P (Addendum)
Chief Complaint: Patient was seen in consultation today for right renal mass biopsy at the request of Shadad,Firas N  Referring Physician(s): [name]  Supervising Physician: Mir, Sharen Heck  Patient Status: [facility] - Out-pt  History of Present Illness: Dylan Good is a 50 y.o. 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 Large aggressive RIGHT renal mass with intense peripheral metabolic activity consistent with primary renal carcinoma. Mass extends to the porta hepatis and ventral peritoneal surface. 2. Extensive thoracic metastasis with LEFT lung lobe metastasis as well as nodular pleural metastasis in LEFT pleural space. Pleural metastasis directly invade the LEFT fifth rib. 3. Very large LEFT pleural effusion with passive atelectasis of the LEFT lung. 4. Nodular metastasis to the RIGHT lung. 5. Bulky mediastinal paratracheal metastatic adenopathy. 6. Oblong mass in the LEFT suprarenal space of unclear etiology but favored malignant. 7. Small subcutaneous nodule metastasis superficial to the RIGHT gluteus muscle.  He follows with Dr Alen Blew Note 02/26/21: Obtaining tissue biopsy will allow [place] to determine the histology of his kidney cancer and also plan for future therapies.  He understands this and will arrange for that in the near future.  Imaging reviewed with Dr Vernard Gambles-- approves Rt renal mass biopsy   Past Medical History:  Diagnosis Date   Cancer, metastatic to lung [facility])    Renal cell adenocarcinoma, left [facility])     Past Surgical History:  Procedure Laterality Date   APPENDECTOMY     CHEST TUBE INSERTION Left 02/06/2021   Procedure: INSERTION PLEURAL DRAINAGE CATHETER;  Surgeon: Garner Nash, DO;  Location: [facility];  Service: Pulmonary;  Laterality: Left;     Allergies: Patient has no known allergies.  Medications: Prior to Admission medications   Not on File     History reviewed. No pertinent family history.  Social History   Socioeconomic History   Marital status: Single    Spouse name: Not on file   Number of children: Not on file   Years of education: Not on file   Highest education level: Not on file  Occupational History   Not on file  Tobacco Use   Smoking status: Some Days    Packs/day: 0.50    Types: Cigarettes   Smokeless tobacco: Never  Vaping Use   Vaping Use: Never used  Substance and Sexual Activity   Alcohol use: Never   Drug use: Never   Sexual activity: Not on file  Other Topics Concern   Not on file  Social History Narrative   Not on file   Social Determinants of Health   Financial Resource Strain: Not on file  Food Insecurity: Not on file  Transportation Needs: Not on file  Physical Activity: Not on file  Stress: Not on file  Social Connections: Not on file    Review of Systems: A 12 point ROS discussed and pertinent positives are indicated in the HPI above.  All other systems are negative.  Review of Systems  Constitutional:  Negative for fever and unexpected weight change.  Respiratory:  Negative for cough and shortness of breath.   Cardiovascular:  Negative for chest pain.  Gastrointestinal:  Negative for abdominal pain.  Psychiatric/Behavioral:  Negative for behavioral problems and confusion.    Vital Signs: BP (!) 146/93   Pulse 99   Temp 98.2 F (36.8 C) (Oral)  Resp 18   Ht 5\' 9"  (1.753 m)   Wt 184 lb (83.5 kg)   SpO2 99%   BMI 27.17 kg/m   Physical Exam Vitals reviewed.  HENT:     Mouth/Throat:     Mouth: Mucous membranes are moist.  Cardiovascular:     Rate and Rhythm: Normal rate and regular rhythm.     Heart sounds: Normal heart sounds.  Pulmonary:     Effort: Pulmonary effort is normal.     Breath sounds: Normal breath sounds.  Abdominal:     Palpations:  Abdomen is soft.  Musculoskeletal:        General: Normal range of motion.  Skin:    General: Skin is warm.  Neurological:     Mental Status: He is alert and oriented to person, place, and time.  Psychiatric:        Behavior: Behavior normal.    Imaging: DG Chest 2 View  Result Date: 02/23/2021 CLINICAL DATA:  Follow-up left pleural effusion. Mild cough. Cancer, metastatic to lung. EXAM: CHEST - 2 VIEW COMPARISON:  02/06/2021 FINDINGS: Again noted is fullness in the mediastinum compatible with known lymphadenopathy. Scattered irregular nodular densities compatible with known metastatic disease. Patient has a tunneled left pleural catheter which is coiled in the left lower hemithorax. Slightly decreased densities in the left lower chest but there is a residual loculated or complex left pleural fluid or pleural disease. Residual consolidation or airspace densities at the left lung base. There may be trace left pleural air but majority of the pleural air has resolved. IMPRESSION: 1. Left pleural effusion/pleural densities have slightly decreased since 02/06/2001. Residual pleural and parenchymal densities in left chest are compatible with a loculated left pleural effusion and residual atelectasis or consolidation at the left lung base. 2. Stable position of the tunneled left pleural catheter. Majority of the left pleural air has resolved. 3. Evidence for metastatic disease demonstrated by multiple irregular pulmonary nodules and mediastinal lymphadenopathy. Electronically Signed   By: Markus Daft M.D.   On: 02/23/2021 09:44   DG CHEST PORT 1 VIEW  Result Date: 02/06/2021 CLINICAL DATA:  Left chest tube, effusion EXAM: PORTABLE CHEST 1 VIEW COMPARISON:  01/26/2021 FINDINGS: Single frontal view of the chest demonstrates a partially loculated left pleural effusion, with a small amount of gas in the fluid collection likely reflecting interval pleural drainage catheter placement. Overall slight decrease in  size of the pleural fluid. Continued bilateral pulmonary nodules with dense left basilar consolidation unchanged. No acute bony abnormality. IMPRESSION: 1. Left-sided hydropneumothorax, decreased in size since prior study. Interval placement of indwelling pleural drainage catheter. 2. Stable dense left basilar consolidation. 3. Multiple pulmonary nodules consistent with known metastases. Electronically Signed   By: Randa Ngo M.D.   On: 02/06/2021 16:22    Labs:  CBC: Recent Labs    01/20/21 1911 02/05/21 1027 02/26/21 0801 03/06/21 0620  WBC 5.9 5.0 4.6 5.6  HGB 11.3* 12.2* 11.4* 11.5*  HCT 35.0* 37.5* 35.9* 37.2*  PLT 332 360 321 319    COAGS: Recent Labs    03/06/21 0620  INR 1.1    BMP: Recent Labs    01/15/21 0814 01/20/21 1911 02/05/21 1027 02/26/21 0801  NA 137 134* 134* 135  K 3.7 3.9 4.5 4.2  CL 106 104 104 105  CO2 23 21* 22 21*  GLUCOSE 111* 109* 106* 100*  BUN 10 8 11 10   CALCIUM 13.6* 9.7 12.3* 12.9*  CREATININE 0.95 0.70 0.79 0.84  GFRNONAA >60 >60 >60 >60    LIVER FUNCTION TESTS: Recent Labs    01/15/21 0814 01/20/21 1911 02/05/21 1027 02/26/21 0801  BILITOT 0.7 0.9 1.0 0.5  AST 21 40 48* 14*  ALT 42 67* 101* 22  ALKPHOS 83 84 88 75  PROT 7.5 7.7 8.0 7.6  ALBUMIN 3.3* 3.6 3.4* 3.4*    TUMOR MARKERS: No results for input(s): AFPTM, CEA, CA199, CHROMGRNA in the last 8760 hours.  Assessment and Plan:  Rt renal mass Renal cancer dx June 2022 Left recurrent effusion-- PleurX cath placed 02/2021 CNS mets-- stereotactic radiosurgery 01/2021  Scheduled today for right renal mass biopsy-- to  determine the histology of his kidney cancer and also plan for future therapies per Dr Alen Blew Risks and benefits of right renal mas biopsy was discussed with the patient and/or patient's family including, but not limited to bleeding, infection, damage to adjacent structures or low yield requiring additional tests.  All of the questions were answered  and there is agreement to proceed. Consent signed and in chart.   Thank you for this interesting consult.  I greatly enjoyed meeting Raunak L Prokop and look forward to participating in their care.  A copy of this report was sent to the requesting provider on this date.  Electronically Signed: Lavonia Drafts, PA-C 03/06/2021, 7:18 AM   I spent a total of  30 Minutes   in face to face in clinical consultation, greater than 50% of which was counseling/coordinating care for right renal mass bx

## 2021-03-06 NOTE — Sedation Documentation (Signed)
Vital signs stable. Procedure started 

## 2021-03-06 NOTE — Procedures (Signed)
Interventional Radiology Procedure Note  Procedure: CT guided right renal mass biopsy  Indication: Right renal mass  Findings: Please refer to procedural dictation for full description.  Complications: None  EBL: < 10 mL  Dylan Roux, MD [phone]

## 2021-03-06 NOTE — Sedation Documentation (Signed)
SAMPLES TAKEN, PROCEDURE CONTINUES

## 2021-03-06 NOTE — Sedation Documentation (Signed)
Patient is resting comfortably. 

## 2021-03-07 ENCOUNTER — Ambulatory Visit: Payer: Self-pay | Admitting: Radiation Oncology

## 2021-03-07 ENCOUNTER — Encounter: Payer: Self-pay | Admitting: Oncology

## 2021-03-07 NOTE — Progress Notes (Signed)
                                                                                                                                                             Patient Name: Dylan Good MRN: [id number] DOB: 1971-05-15 Referring Physician: Zola Button (Profile Not Attached) Date of Service: 01/29/2021  [facility] [place]                                                        End Of Treatment Note  Diagnoses: C79.31-Secondary malignant neoplasm of brain  Cancer Staging: STAGE IV  Intent: Palliative  Radiation Treatment Dates: 01/29/2021 through 01/29/2021 Site Technique Total Dose (Gy) Dose per Fx (Gy) Completed Fx Beam Energies  Brain: Brain_SRS IMRT 20/20 20 1/1 6XFFF   Left temporal 42mm target and right temporal 2 mm target were treated to 20Gy in 1 fraction with SRS / VMAT technique using 6 MV photons, flattening filter free.  Narrative: The patient tolerated radiation therapy relatively well.   Plan: The patient will follow-up with radiation oncology in 54mo. -----------------------------------  Eppie Costley, MD

## 2021-03-08 ENCOUNTER — Encounter: Payer: Self-pay | Admitting: Oncology

## 2021-03-08 LAB — SURGICAL PATHOLOGY

## 2021-03-09 ENCOUNTER — Other Ambulatory Visit: Payer: Self-pay | Admitting: Radiation Therapy

## 2021-03-09 DIAGNOSIS — C7949 Secondary malignant neoplasm of other parts of nervous system: Secondary | ICD-10-CM

## 2021-03-15 ENCOUNTER — Telehealth: Payer: Self-pay

## 2021-03-15 NOTE — Telephone Encounter (Signed)
Notified Patient of completion of FMLA forms. Fax transmission confirmation received. Copy mailed to Patient as requested.

## 2021-03-19 ENCOUNTER — Inpatient Hospital Stay: Payer: Medicaid Other | Admitting: Oncology

## 2021-03-19 ENCOUNTER — Other Ambulatory Visit: Payer: Self-pay | Admitting: Radiation Therapy

## 2021-03-19 ENCOUNTER — Other Ambulatory Visit: Payer: Self-pay

## 2021-03-19 ENCOUNTER — Inpatient Hospital Stay: Payer: Medicaid Other

## 2021-03-19 ENCOUNTER — Inpatient Hospital Stay: Payer: Medicaid Other | Admitting: Nutrition

## 2021-03-19 ENCOUNTER — Encounter: Payer: Self-pay | Admitting: *Deleted

## 2021-03-19 ENCOUNTER — Inpatient Hospital Stay: Payer: Medicaid Other | Attending: Oncology

## 2021-03-19 VITALS — BP 134/81 | HR 102 | Temp 97.6°F | Resp 18 | Ht 69.0 in | Wt 184.8 lb

## 2021-03-19 VITALS — HR 96

## 2021-03-19 DIAGNOSIS — N2889 Other specified disorders of kidney and ureter: Secondary | ICD-10-CM

## 2021-03-19 DIAGNOSIS — J90 Pleural effusion, not elsewhere classified: Secondary | ICD-10-CM | POA: Diagnosis not present

## 2021-03-19 DIAGNOSIS — F17210 Nicotine dependence, cigarettes, uncomplicated: Secondary | ICD-10-CM | POA: Diagnosis not present

## 2021-03-19 DIAGNOSIS — C7931 Secondary malignant neoplasm of brain: Secondary | ICD-10-CM | POA: Insufficient documentation

## 2021-03-19 DIAGNOSIS — Z5112 Encounter for antineoplastic immunotherapy: Secondary | ICD-10-CM | POA: Insufficient documentation

## 2021-03-19 DIAGNOSIS — C641 Malignant neoplasm of right kidney, except renal pelvis: Secondary | ICD-10-CM

## 2021-03-19 DIAGNOSIS — R059 Cough, unspecified: Secondary | ICD-10-CM | POA: Insufficient documentation

## 2021-03-19 DIAGNOSIS — Z923 Personal history of irradiation: Secondary | ICD-10-CM | POA: Diagnosis not present

## 2021-03-19 DIAGNOSIS — R918 Other nonspecific abnormal finding of lung field: Secondary | ICD-10-CM

## 2021-03-19 LAB — CBC WITH DIFFERENTIAL (CANCER CENTER ONLY)
Abs Immature Granulocytes: 0.01 10*3/uL (ref 0.00–0.07)
Basophils Absolute: 0.1 10*3/uL (ref 0.0–0.1)
Basophils Relative: 1 %
Eosinophils Absolute: 0.2 10*3/uL (ref 0.0–0.5)
Eosinophils Relative: 3 %
HCT: 36.8 % — ABNORMAL LOW (ref 39.0–52.0)
Hemoglobin: 11.4 g/dL — ABNORMAL LOW (ref 13.0–17.0)
Immature Granulocytes: 0 %
Lymphocytes Relative: 19 %
Lymphs Abs: 1.1 10*3/uL (ref 0.7–4.0)
MCH: 22.6 pg — ABNORMAL LOW (ref 26.0–34.0)
MCHC: 31 g/dL (ref 30.0–36.0)
MCV: 72.9 fL — ABNORMAL LOW (ref 80.0–100.0)
Monocytes Absolute: 0.7 10*3/uL (ref 0.1–1.0)
Monocytes Relative: 13 %
Neutro Abs: 3.6 10*3/uL (ref 1.7–7.7)
Neutrophils Relative %: 64 %
Platelet Count: 306 10*3/uL (ref 150–400)
RBC: 5.05 MIL/uL (ref 4.22–5.81)
RDW: 15.8 % — ABNORMAL HIGH (ref 11.5–15.5)
WBC Count: 5.7 10*3/uL (ref 4.0–10.5)
nRBC: 0 % (ref 0.0–0.2)

## 2021-03-19 LAB — CMP (CANCER CENTER ONLY)
ALT: 22 U/L (ref 0–44)
AST: 16 U/L (ref 15–41)
Albumin: 3.5 g/dL (ref 3.5–5.0)
Alkaline Phosphatase: 79 U/L (ref 38–126)
Anion gap: 7 (ref 5–15)
BUN: 11 mg/dL (ref 6–20)
CO2: 20 mmol/L — ABNORMAL LOW (ref 22–32)
Calcium: 12.8 mg/dL — ABNORMAL HIGH (ref 8.9–10.3)
Chloride: 108 mmol/L (ref 98–111)
Creatinine: 0.79 mg/dL (ref 0.61–1.24)
GFR, Estimated: >60 mL/min (ref >60)
Glucose, Bld: 106 mg/dL — ABNORMAL HIGH (ref 70–99)
Potassium: 4.2 mmol/L (ref 3.5–5.1)
Sodium: 135 mmol/L (ref 135–145)
Total Bilirubin: 0.4 mg/dL (ref 0.3–1.2)
Total Protein: 7.7 g/dL (ref 6.5–8.1)

## 2021-03-19 MED ORDER — FAMOTIDINE 20 MG IN NS 100 ML IVPB
20.0000 mg | Freq: Once | INTRAVENOUS | Status: AC
  Administered 2021-03-19: 20 mg via INTRAVENOUS
  Filled 2021-03-19: qty 100

## 2021-03-19 MED ORDER — SODIUM CHLORIDE 0.9 % IV SOLN
85.0000 mg | Freq: Once | INTRAVENOUS | Status: AC
  Administered 2021-03-19: 85 mg via INTRAVENOUS
  Filled 2021-03-19: qty 17

## 2021-03-19 MED ORDER — SODIUM CHLORIDE 0.9 % IV SOLN: Freq: Once | INTRAVENOUS | Status: AC

## 2021-03-19 MED ORDER — DIPHENHYDRAMINE HCL 50 MG/ML IJ SOLN
25.0000 mg | Freq: Once | INTRAMUSCULAR | Status: AC
  Administered 2021-03-19: 25 mg via INTRAVENOUS
  Filled 2021-03-19: qty 1

## 2021-03-19 MED ORDER — SODIUM CHLORIDE 0.9 % IV SOLN
240.0000 mg | Freq: Once | INTRAVENOUS | Status: AC
  Administered 2021-03-19: 240 mg via INTRAVENOUS
  Filled 2021-03-19: qty 24

## 2021-03-19 NOTE — Patient Instructions (Signed)
[facility]   Discharge Instructions: Thank you for choosing [facility] to provide your oncology and hematology care.   If you have a lab appointment with the [facility], please go directly to the [facility] and check in at the registration area.   Wear comfortable clothing and clothing appropriate for easy access to any Portacath or PICC line.   We strive to give you quality time with your provider. You may need to reschedule your appointment if you arrive late (15 or more minutes).  Arriving late affects you and other patients whose appointments are after yours.  Also, if you miss three or more appointments without notifying the office, you may be dismissed from the clinic at the provider's discretion.      For prescription refill requests, have your pharmacy contact our office and allow 72 hours for refills to be completed.    Today you received the following chemotherapy and/or immunotherapy agents: ipilimumab and nivolumab.       To help prevent nausea and vomiting after your treatment, we encourage you to take your nausea medication as directed.  BELOW ARE SYMPTOMS THAT SHOULD BE REPORTED IMMEDIATELY: *FEVER GREATER THAN 100.4 F (38 C) OR HIGHER *CHILLS OR SWEATING *NAUSEA AND VOMITING THAT IS NOT CONTROLLED WITH YOUR NAUSEA MEDICATION *UNUSUAL SHORTNESS OF BREATH *UNUSUAL BRUISING OR BLEEDING *URINARY PROBLEMS (pain or burning when urinating, or frequent urination) *BOWEL PROBLEMS (unusual diarrhea, constipation, pain near the anus) TENDERNESS IN MOUTH AND THROAT WITH OR WITHOUT PRESENCE OF ULCERS (sore throat, sores in mouth, or a toothache) UNUSUAL RASH, SWELLING OR PAIN  UNUSUAL VAGINAL DISCHARGE OR ITCHING   Items with * indicate a potential emergency and should be followed up as soon as possible or go to the Emergency Department if any problems should occur.  Please show the CHEMOTHERAPY ALERT CARD or IMMUNOTHERAPY ALERT  CARD at check-in to the Emergency Department and triage nurse.  Should you have questions after your visit or need to cancel or reschedule your appointment, please contact [facility]  Dept: [phone]  and follow the prompts.  Office hours are 8:00 a.m. to 4:30 p.m. Monday - Friday. Please note that voicemails left after 4:00 p.m. may not be returned until the following business day.  We are closed weekends and major holidays. You have access to a nurse at all times for urgent questions. Please call the main number to the clinic Dept: [phone] and follow the prompts.   For any non-urgent questions, you may also contact your provider using MyChart. We now offer e-Visits for anyone 48 and older to request care online for non-urgent symptoms. For details visit [email or web address].   Also download the MyChart app! Go to the app store, search "MyChart", open the app, select [facility], and log in with your MyChart username and password.  Due to Covid, a mask is required upon entering the hospital/clinic. If you do not have a mask, one will be given to you upon arrival. For doctor visits, patients may have 1 support person aged 48 or older with them. For treatment visits, patients cannot have anyone with them due to current Covid guidelines and our immunocompromised population.

## 2021-03-19 NOTE — Progress Notes (Signed)

## 2021-03-19 NOTE — Progress Notes (Signed)
Hematology and Oncology Follow Up Visit  Dylan Good [id number] [birth year] 50 y.o. 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 He presented with bilateral pulmonary nodules and pleural effusion and CNS disease.   Prior Therapy:   He is status post thoracentesis completed in June 2022 and repeated on January 02, 2021.  He is s/p Pleurx catheter insertion in August 2022.  He is status post stereotactic radiosurgery to the brain completed on January 29, 2021.  He received 20 Gray in 1 fraction.  He is status post kidney biopsy completed on March 06, 2021.  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  Current therapy: Ipilimumab 1 mg/kg with nivolumab 3 mg/kg started on January 15, 2021.  He is here for cycle 4 of therapy.  Interim History: Dylan Good presents today for a follow-up visit.  Since the last visit, he reports no major changes in his health.  He continues to show slow but gradual improvement in his health and his strength.  He is interested in returning to work part-time.  He had denies any nausea, vomiting or abdominal pain he denies any shortness of breath or difficulty breathing.  He does report occasional cough.  His cough is nonproductive at this time.  He is eating better and maintaining his weight.    Medications: Updated on review.     Allergies: No Known Allergies    Physical Exam:    ECOG: 1   General appearance: Comfortable appearing without any discomfort Head: Normocephalic without any trauma Oropharynx: Mucous membranes are moist and pink without any thrush or ulcers. Eyes: Pupils are equal and round reactive to light. Lymph nodes: No cervical, supraclavicular, inguinal or axillary lymphadenopathy.   Heart:regular rate and rhythm.  S1 and S2 without leg edema. Lung: Slight decrease in breath sounds at the base of  his left lung.  No wheezes or rhonchi. Abdomin: Soft, nontender, nondistended with good bowel sounds.  No hepatosplenomegaly. Musculoskeletal: No joint deformity or effusion.  Full range of motion noted. Neurological: No deficits noted on motor, sensory and deep tendon reflex exam. Skin: No petechial rash or dryness.  Appeared moist.      Lab Results: Lab Results  Component Value Date   WBC 5.6 03/06/2021   HGB 11.5 (L) 03/06/2021   HCT 37.2 (L) 03/06/2021   MCV 74.1 (L) 03/06/2021   PLT 319 03/06/2021     Chemistry      Component Value Date/Time   NA 135 02/26/2021 0801   K 4.2 02/26/2021 0801   CL 105 02/26/2021 0801   CO2 21 (L) 02/26/2021 0801   BUN 10 02/26/2021 0801   CREATININE 0.84 02/26/2021 0801      Component Value Date/Time   CALCIUM 12.9 (H) 02/26/2021 0801   ALKPHOS 75 02/26/2021 0801   AST 14 (L) 02/26/2021 0801   ALT 22 02/26/2021 0801   BILITOT 0.5 02/26/2021 0801         Impression and Plan:   50 year old man with:  1.  Stage IV clear-cell renal cell carcinoma presented in June 2022.  He has done pulmonary and management as needed.  He is currently on nivolumab and ipilimumab without any major complications.  Risks and benefits of continuing this treatment were discussed at this time.  Complication occluding immune mediated issues, GI toxicity among others were reviewed.  The plan is to proceed with  systemic therapy today and updated staging scans before the next visit.  Maintenance nivolumab would be the next step if he has response to therapy.  Switching to a different salvage agent if she has progression of disease.    2.  Pleural effusion: Pleurx catheter inserted with improvement in his clinical status.  He has very little drainage noted in the last month.  We will reach out to pulmonary medicine for possible catheter removal.    3.  Hypercalcemia: Last Zometa given on August 22 and will be repeated as needed.  Is not related to malignant.   Calcium level will be repeated today.  4.  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  5.  Antiemetics: Compazine is available to him without any nausea or vomiting.  6.  Goals of care and prognosis: His disease is incurable although responses are warranted given on agent.  7.  Immune mediated complications: discussed today in details including pneumonitis, colitis and thyroid pain.  We will continue to monitor.       8.  Follow-up: In 3 weeks for repeat evaluation and possible part of maintenance nivolumab.   30  minutes were spent on this encounter.  The time was dedicated Laboratory data, disease status update, and discussing complications of his cancer and therapy. Dylan Button, MD 9/12/20228:16 AM

## 2021-03-19 NOTE — Progress Notes (Signed)
Brief nutrition follow-up completed with patient during infusion for metastatic kidney cancer.  Today is cycle 4. Weight stable at 184.8 pounds September 12 from 186 pounds. Noted labs: Glucose 106 and albumin 3.4. Patient continues to deny nutrition impact symptoms. He does report his appetite has improved and he thinks he is eating well. Reports he has not even had to drink boost because he is feeling so much better. He has no questions or concerns today.  Nutrition diagnosis: Unintended weight loss has stabilized.  Intervention: Provided support and encouragement for patient to continue strategies for small frequent meals and snacks with adequate calories and protein for weight maintenance. Continue to supplement oral intake with boost and monitor weight. Encourage patient to contact RD if he develops any questions or concerns.  Monitoring, evaluation, goals: Patient will tolerate adequate calories and protein for weight maintenance.  Next visit: Please refer back to RD if nutrition problems are identified.  **Disclaimer: This note was dictated with voice recognition software. Similar sounding words can inadvertently be transcribed and this note may contain transcription errors which may not have been corrected upon publication of note.**

## 2021-03-20 ENCOUNTER — Telehealth: Payer: Self-pay

## 2021-03-20 NOTE — Telephone Encounter (Signed)
Called and spoke with patient to get him scheduled for follow up to discuss/reevaluate pleurx per Dr. Valeta Harms. Patient has been scheduled with Tammy Parrett. Dr. Valeta Harms will be in the office that day but is full. Nothing further needed at this time.   Next Appt With Pulmonology (Tammy Parrett, NP)03/28/2021 at 10:00 AM

## 2021-03-20 NOTE — Telephone Encounter (Signed)
-----   Message from Garner Nash, DO sent at 03/19/2021  7:54 PM EDT ----- Regarding: FW: Pleurx catheter Please schedule with APP or me for 15 min follow up appt Reason: discuss IPC drain  Thanks Brad   ----- Message ----- From: [name], MD Sent: 03/19/2021   8:43 AM EDT To: Garner Nash, DO Subject: Pleurx catheter                                Do you mind reevaluating this patient's Pleurx catheter?  He has not had any drainage and I wonder if it can come out.  He is having staging scans in the next couple weeks.  Thank you

## 2021-03-28 ENCOUNTER — Ambulatory Visit (INDEPENDENT_AMBULATORY_CARE_PROVIDER_SITE_OTHER): Payer: Medicaid Other

## 2021-03-28 ENCOUNTER — Other Ambulatory Visit: Payer: Self-pay

## 2021-03-28 ENCOUNTER — Telehealth: Payer: Self-pay | Admitting: Pulmonary Disease

## 2021-03-28 ENCOUNTER — Encounter: Payer: Self-pay | Admitting: Adult Health

## 2021-03-28 ENCOUNTER — Ambulatory Visit: Payer: Medicaid Other | Admitting: Adult Health

## 2021-03-28 VITALS — BP 126/70 | HR 101 | Temp 98.2°F | Ht 69.0 in | Wt 185.4 lb

## 2021-03-28 DIAGNOSIS — C641 Malignant neoplasm of right kidney, except renal pelvis: Secondary | ICD-10-CM

## 2021-03-28 DIAGNOSIS — J90 Pleural effusion, not elsewhere classified: Secondary | ICD-10-CM

## 2021-03-28 IMAGING — DX DG CHEST 2V
2 series · 2 of 2 positions shown · non-contrast
Comparison: [DATE]

CLINICAL DATA: Pleural effusion.

EXAM:
CHEST - 2 VIEW

[chest pa]
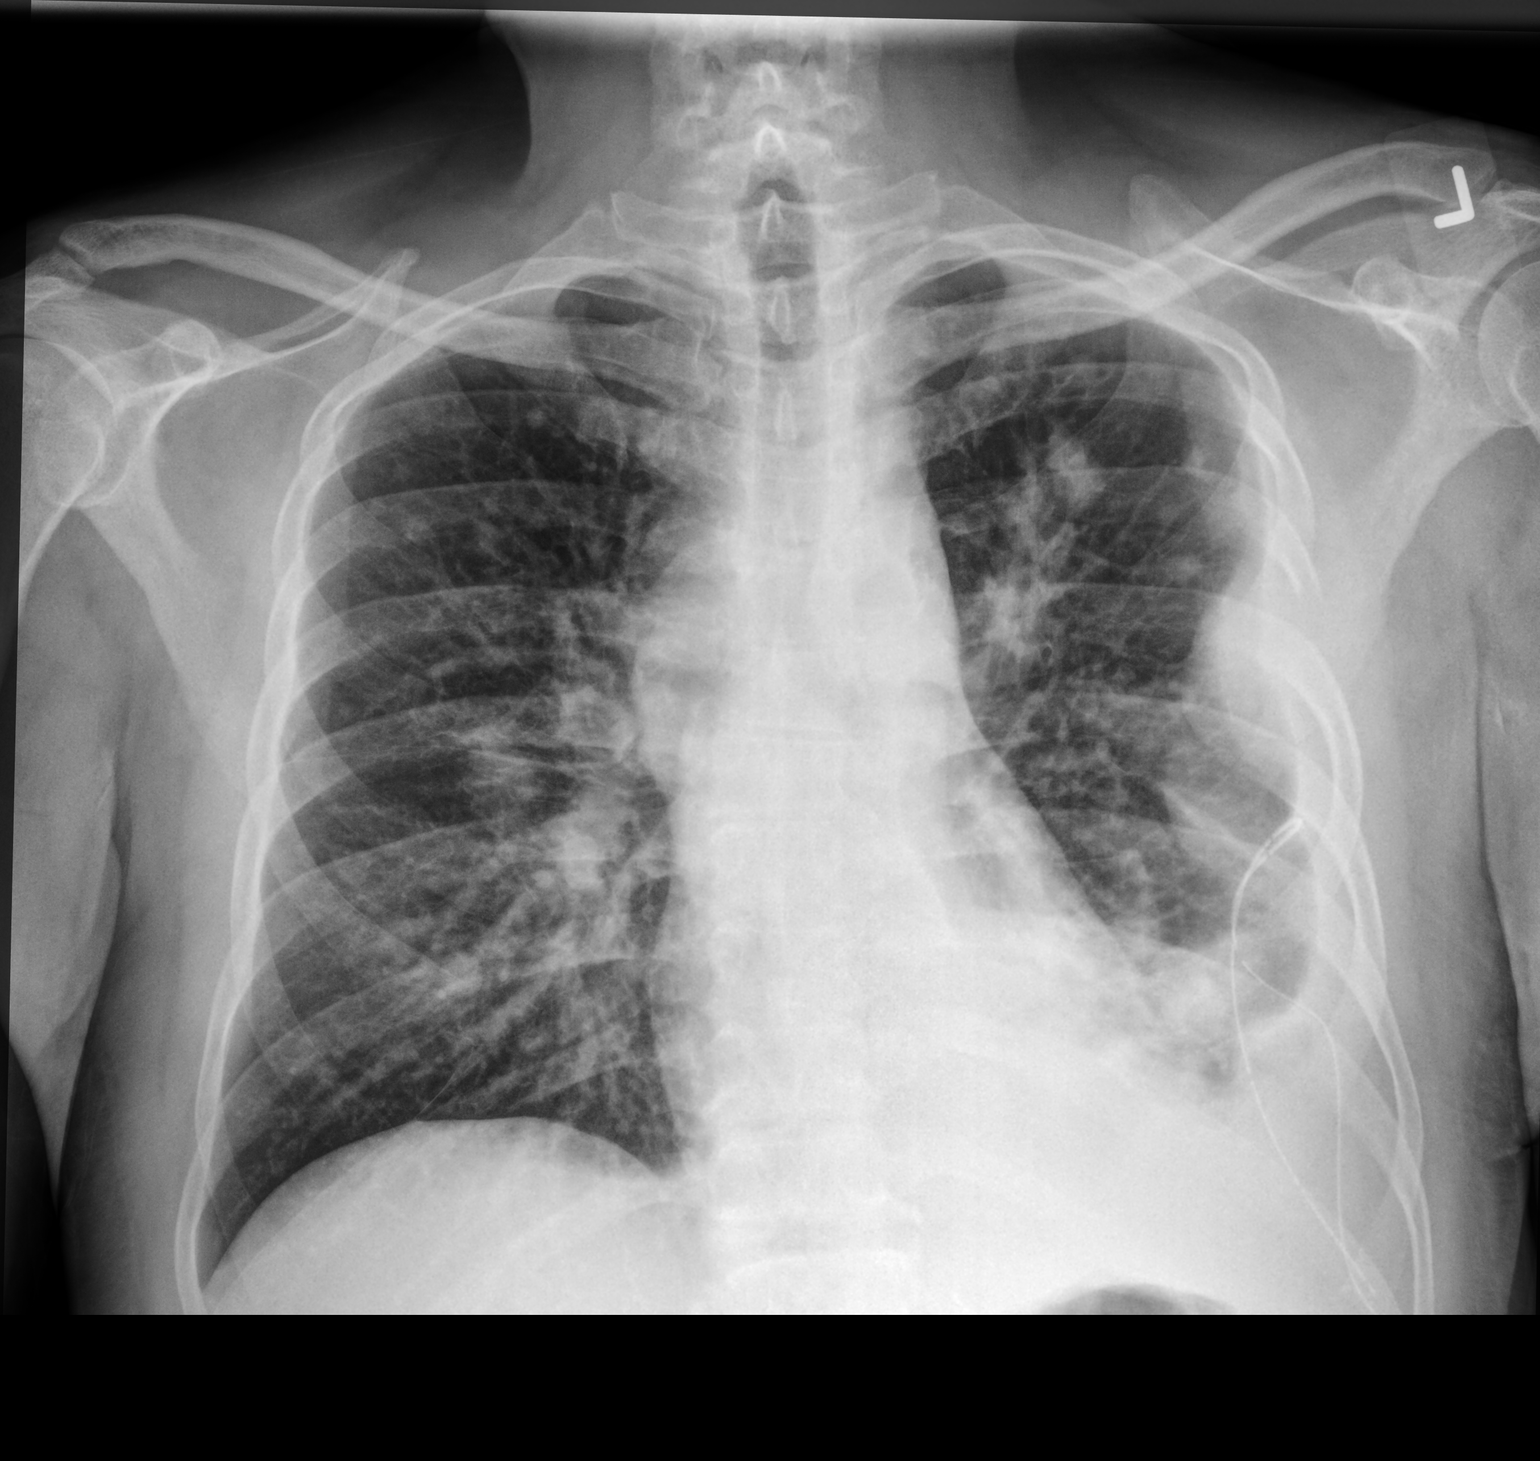

[chest lat]
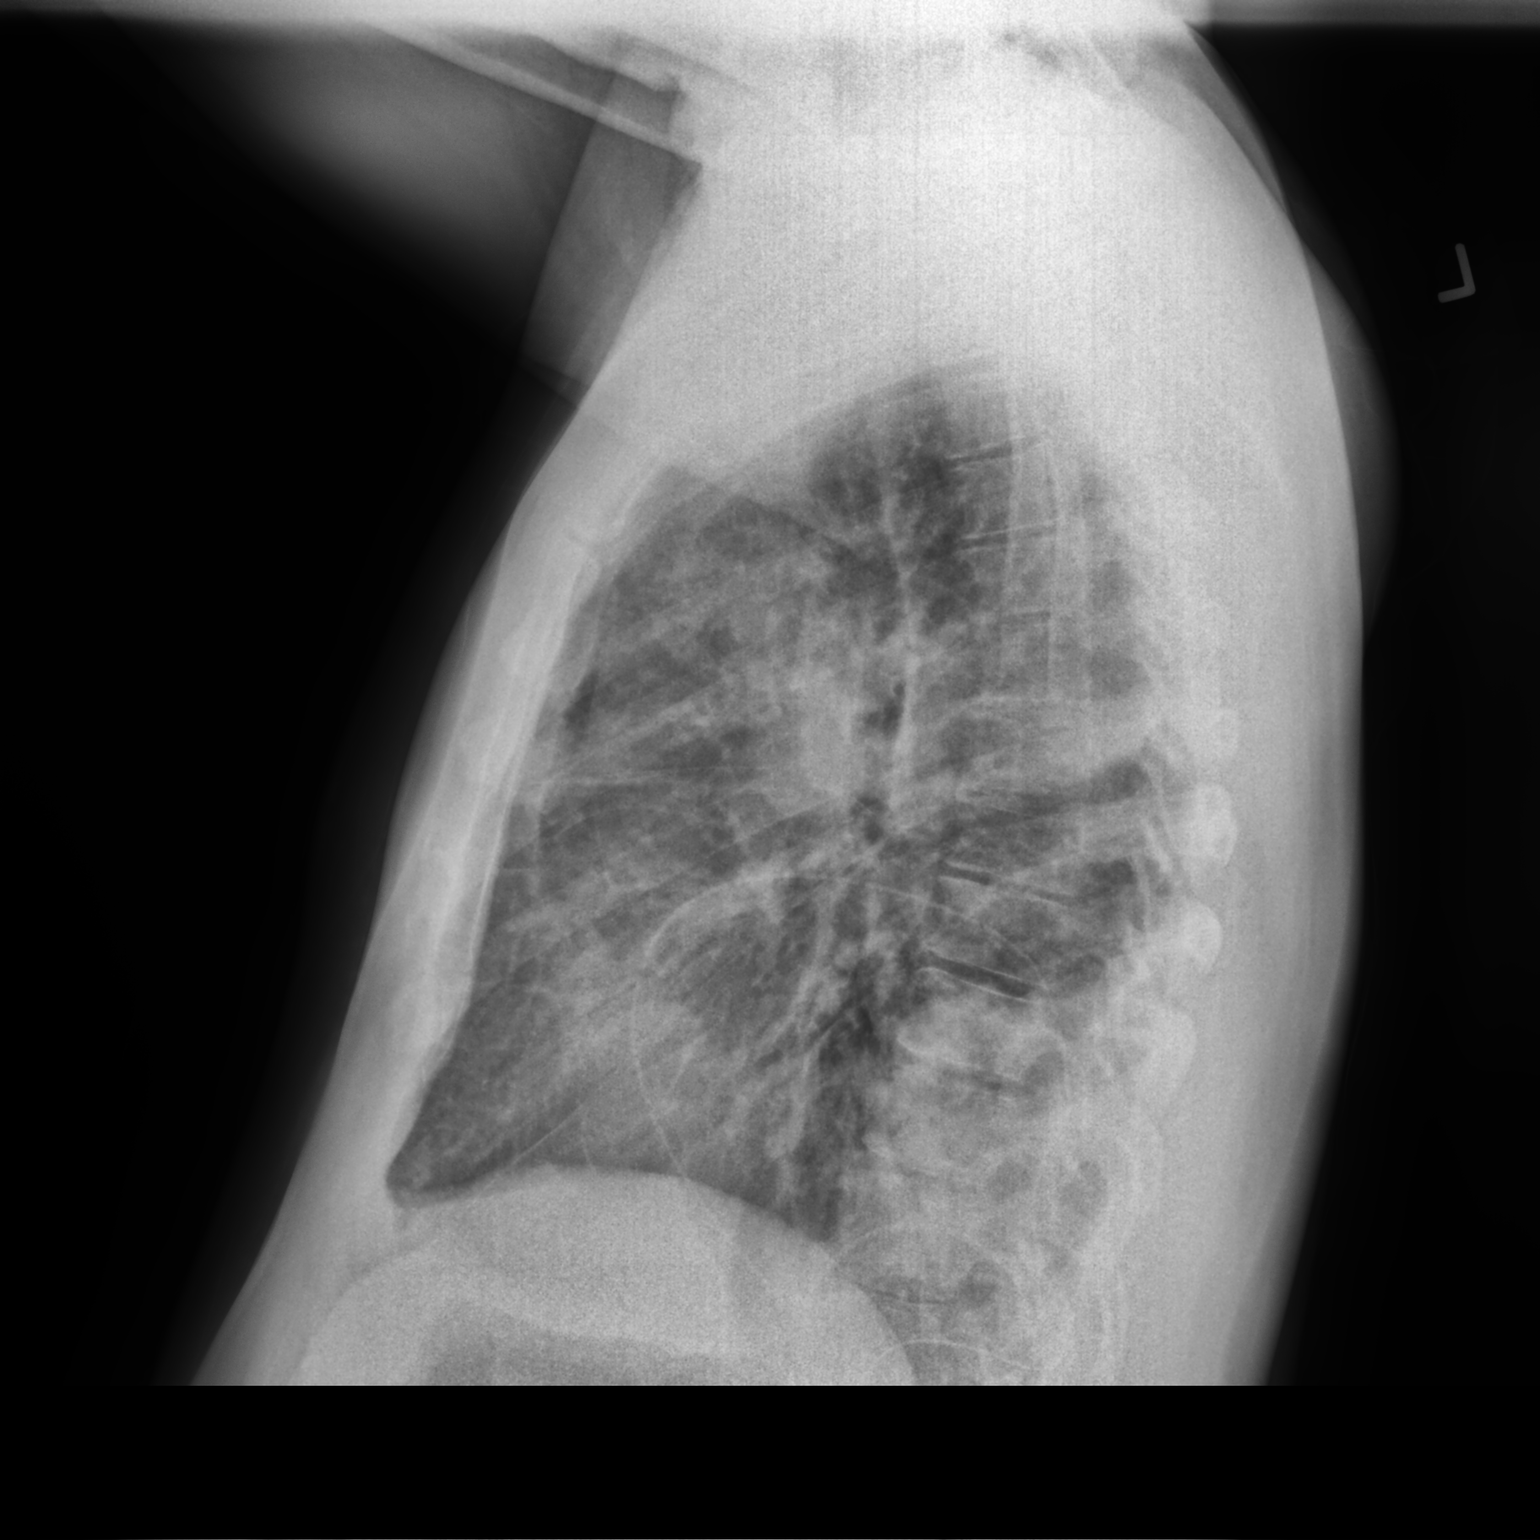

[2 of 2 positions shown; findings below may reference images not displayed]

FINDINGS: Left chest tube is identified and appears unchanged in position
compared with the previous exam. The left pleural effusion does not
appear significantly changed compared with [DATE].

Mediastinal adenopathy is again seen. Bilateral pulmonary nodules
compatible with metastatic disease . Left upper lobe subpleural mass
with erosive features involving the lateral aspect of the left fifth
rib is again seen in appears slightly increased from previous exam.
IMPRESSION: 1. Unchanged left chest tube with stable volume of left pleural
effusion.
2. Diffuse pulmonary metastasis.
3. Subpleural mass with erosive changes involving the left fifth rib
appears increased from previous exam.

## 2021-03-28 NOTE — H&P (View-Only) (Signed)
@Patient  ID: Dylan Good, male    DOB: December 10, 1970, 50 y.o.   MRN: [id number]  Chief Complaint  Patient presents with   Follow-up    Referring provider: No ref. provider found  HPI: 50 year old male smoker seen for pulmonary consult January 31, 2021 for large left pleural effusion.  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  He received stereotactic radiosurgery to the brain January 29, 2021.  TEST/EVENTS :  /22/2022: Chest x-ray Pleural effusion improved after thoracentesis. The patient's images have been independently reviewed by me.    CT scan chest July 2022: Extensive thoracic metastasis and large left-sided pleural effusion and pleural nodular metastasis.  PET scan on 01/18/2021 shows large right renal mass consistent with primary renal carcinoma, thoracic metastases, left lung lobe and pleural metastases invading the fifth rib, large left pleural effusion, right lung nodular metastases, bulky mediastinal paratracheal metastatic adenopathy, left suprarenal oblong mass, small subcutaneous nodule metastases superficial to the right gluteus muscle. 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  Patient has been seen earlier this summer for large left pleural effusion.  Patient has underlying metastatic renal cell adenocarcinoma with pleural-based metastatic disease in the left hemothorax with brain mets.  He underwent multiple thoracentesis.  Cytology January 02, 2021 showed reactive mesothelial cells.  A Pleurx drainage tube was placed February 06, 2021.  Initially Pleurx was draining well.  Patient was seen last month with decreased pleural fluid drainage.  He was  recommended to decrease his pleural drainage to every 3 days.  Patient returns today stating that he is getting no fluid out of his Pleurx for the last 4 weeks.  Patient denies any pain, hemoptysis, weight loss. Chest x-ray today shows stable left pleural effusion.  Stable bilateral pulmonary nodules compatible with metastatic disease and a left upper lobe subpleural mass with erosive features involving the lateral fifth rib. He says his appetite is good.  Energy level is starting to pick up.  Remains weak He says his shortness of breath is a lot better  He continues to follow-up with medical and radiation oncology.  He is undergoing active therapy with Ipilimumab 1 mg/kg with nivolumab 3 mg/kg started on January 15, 2021   No Known Allergies  Immunization History  Administered Date(s) Administered   Moderna Sars-Covid-2 Vaccination 07/06/2020   Tdap 09/29/2017    Past Medical History:  Diagnosis Date   Cancer, metastatic to lung ([place])    Renal cell adenocarcinoma, left ([place])     Tobacco History: Social History   Tobacco Use  Smoking Status Some Days   Packs/day: 0.50   Types: Cigarettes  Smokeless Tobacco Never   Ready to quit: Not Answered Counseling given: Not Answered   No outpatient medications prior to visit.   No facility-administered medications prior to visit.     Review of Systems:   Constitutional:   No  weight loss, night sweats,  Fevers, chills, + fatigue, or  lassitude.  HEENT:   No headaches,  Difficulty swallowing,  Tooth/dental problems, or  Sore throat,                No sneezing, itching, ear ache, nasal congestion, post nasal drip,   CV:  No chest pain,  Orthopnea, PND, swelling in lower extremities, anasarca, dizziness, palpitations, syncope.   GI  No heartburn, indigestion, abdominal pain, nausea, vomiting, diarrhea, change in bowel habits, loss of appetite, bloody stools.   Resp:   No chest wall deformity  Skin: no rash or lesions.  GU: no  dysuria, change in color of urine, no urgency or frequency.  No flank pain, no hematuria   MS:  No joint pain or swelling.  No decreased range of motion.  No back pain.    Physical Exam  BP 126/70 (BP Location: Left Arm, Patient Position: Sitting, Cuff Size: Normal)   Pulse (!) 101   Temp 98.2 F (36.8 C) (Oral)   Ht 5\' 9"  (1.753 m)   Wt 185 lb 6.4 oz (84.1 kg)   SpO2 96%   BMI 27.38 kg/m   GEN: A/Ox3; pleasant , NAD, well nourished    HEENT:  [place]/AT,   NOSE-clear, THROAT-clear, no lesions, no postnasal drip or exudate noted.   NECK:  Supple w/ fair ROM; no JVD; normal carotid impulses w/o bruits; no thyromegaly or nodules palpated; no lymphadenopathy.    RESP  Clear  P & A; w/o, wheezes/ rales/ or rhonchi. no accessory muscle use, no dullness to percussion Pleurx on the left lateral chest wall intact, dressing in place.  No surrounding redness  CARD:  RRR, no m/r/g, no peripheral edema, pulses intact, no cyanosis or clubbing.  GI:   Soft & nt; nml bowel sounds; no organomegaly or masses detected.   Musco: Warm bil, no deformities or joint swelling noted.   Neuro: alert, no focal deficits noted.    Skin: Warm, no lesions or rashes    Lab Results:   BNP No results found for: BNP  ProBNP No results found for: PROBNP  Imaging: DG Chest 2 View  Result Date: 03/28/2021 CLINICAL DATA:  Pleural effusion. EXAM: CHEST - 2 VIEW COMPARISON:  02/23/2021 FINDINGS: Left chest tube is identified and appears unchanged in position compared with the previous exam. The left pleural effusion does not appear significantly changed compared with 02/23/2021. Mediastinal adenopathy is again seen. Bilateral pulmonary nodules compatible with metastatic disease . Left upper lobe subpleural mass with erosive features involving the lateral aspect of the left fifth rib is again seen in appears slightly increased from previous exam. IMPRESSION: 1. Unchanged left chest tube with stable volume of  left pleural effusion. 2. Diffuse pulmonary metastasis. 3. Subpleural mass with erosive changes involving the left fifth rib appears increased from previous exam. Electronically Signed   By: Kerby Moors M.D.   On: 03/28/2021 10:42   CT RENAL BIOPSY  Result Date: 03/06/2021 INDICATION: 50 year old gentleman with metastatic renal malignancy presents to IR for biopsy of right renal mass. EXAM: CT-guided biopsy right renal mass. MEDICATIONS: None. ANESTHESIA/SEDATION: Moderate (conscious) sedation was employed during this procedure. A total of Versed 1.5 mg and Fentanyl 75 mcg was administered intravenously. Moderate Sedation Time: 14 minutes. The patient's level of consciousness and vital signs were monitored continuously by radiology nursing throughout the procedure under my direct supervision. COMPLICATIONS: None immediate. PROCEDURE: Informed written consent was obtained from the patient after a thorough discussion of the procedural risks, benefits and alternatives. All questions were addressed. Maximal Sterile Barrier Technique was utilized including caps, mask, sterile gowns, sterile gloves, sterile drape, hand hygiene and skin antiseptic. A timeout was performed prior to the initiation of the procedure. Patient position left lateral decubitus on the CT table. The right lateral  abdominal wall skin prepped and draped in usual fashion. Following local lidocaine administration, 17 gauge introducer needle was advanced into the FDG avid posterior wall of the right renal mass. 4-18 gauge cores were obtained and sent to pathology in formalin. IMPRESSION: CT-guided biopsy of right renal mass as above. Electronically Signed   By: Miachel Roux M.D.   On: 03/06/2021 10:05    diphenhydrAMINE (BENADRYL) injection 25 mg     Date Action Dose Route User   Discharged on 02/06/2021   Admitted on 02/06/2021   02/05/2021 1214 Given 25 mg Intravenous Sinda Du, RN      diphenhydrAMINE (BENADRYL) injection 25 mg      Date Action Dose Route User   02/26/2021 [phone] Given 25 mg Intravenous Isabel Caprice, RN      diphenhydrAMINE (BENADRYL) injection 25 mg     Date Action Dose Route User   03/19/2021 0900 Given 25 mg Intravenous Sinda Du, RN      famotidine (PEPCID) IVPB 20 mg in NS 100 mL IVPB     Date Action Dose Route User   Discharged on 02/06/2021   Admitted on 02/06/2021   02/05/2021 1217 Rate/Dose Change (none) Intravenous Regan Rakers, RN   02/05/2021 1217 New Bag/Given 20 mg Intravenous Sinda Du, RN      famotidine (PEPCID) IVPB 20 mg in NS 100 mL IVPB     Date Action Dose Route User   02/26/2021 [phone] Rate/Dose Change (none) Intravenous Isabel Caprice, RN   02/26/2021 6579 New Bag/Given 20 mg Intravenous Isabel Caprice, RN      famotidine (PEPCID) IVPB 20 mg in NS 100 mL IVPB     Date Action Dose Route User   03/19/2021 0904 Rate/Dose Change (none) Intravenous Sinda Du, RN   03/19/2021 0383 New Bag/Given 20 mg Intravenous Sinda Du, RN      ipilimumab (YERVOY) 85 mg in sodium chloride 0.9 % 50 mL chemo infusion     Date Action Dose Route User   Discharged on 02/06/2021   Admitted on 02/06/2021   02/05/2021 1408 Infusion Verify (none) Intravenous Regan Rakers, RN   02/05/2021 1408 Rate/Dose Change (none) Intravenous Regan Rakers, RN   02/05/2021 1407 New Bag/Given 85 mg Intravenous Otis Brace M, RN      ipilimumab (YERVOY) 85 mg in sodium chloride 0.9 % 50 mL chemo infusion     Date Action Dose Route User   02/26/2021 1132 Infusion Verify (none) Intravenous Isabel Caprice, RN   02/26/2021 1132 Rate/Dose Change (none) Intravenous Isabel Caprice, RN   02/26/2021 1132 New Bag/Given 85 mg Intravenous Isabel Caprice, RN      ipilimumab (YERVOY) 85 mg in sodium chloride 0.9 % 50 mL chemo infusion     Date Action Dose Route User   03/19/2021 1019 Infusion Verify (none) Intravenous Sinda Du, RN    03/19/2021 1019 Rate/Dose Change (none) Intravenous Sinda Du, RN   03/19/2021 1019 New Bag/Given 85 mg Intravenous Otis Brace M, RN      nivolumab (OPDIVO) 240 mg in sodium chloride 0.9 % 100 mL chemo infusion     Date Action Dose Route User   Discharged on 02/06/2021   Admitted on 02/06/2021   02/05/2021 1317 Infusion Verify (none) Intravenous Regan Rakers, RN   02/05/2021 1317 Rate/Dose Change (none) Intravenous Regan Rakers, RN   02/05/2021 1317 New Bag/Given 240 mg Intravenous Sinda Du, RN  nivolumab (OPDIVO) 240 mg in sodium chloride 0.9 % 100 mL chemo infusion     Date Action Dose Route User   02/26/2021 1048 Infusion Verify (none) Intravenous Isabel Caprice, RN   02/26/2021 1047 Rate/Dose Change (none) Intravenous Isabel Caprice, RN   02/26/2021 1047 New Bag/Given 240 mg Intravenous Isabel Caprice, RN      nivolumab (OPDIVO) 240 mg in sodium chloride 0.9 % 100 mL chemo infusion     Date Action Dose Route User   03/19/2021 0938 Infusion Verify (none) Intravenous Sinda Du, RN   03/19/2021 1540 New Bag/Given 240 mg Intravenous Sinda Du, RN      0.9 %  sodium chloride infusion     Date Action Dose Route User   Discharged on 02/06/2021   Admitted on 02/06/2021   02/05/2021 1437 Rate/Dose Change (none) Intravenous Regan Rakers, RN   02/05/2021 1352 Infusion Verify (none) Intravenous Regan Rakers, RN   02/05/2021 1352 Rate/Dose Change (none) Intravenous Regan Rakers, RN   02/05/2021 1347 Rate/Dose Change (none) Intravenous Regan Rakers, RN   02/05/2021 1317 Infusion Verify (none) Intravenous [place], Delfin Gant, RN      0.9 %  sodium chloride infusion     Date Action Dose Route User   02/26/2021 1207 Rate/Dose Change (none) Intravenous Isabel Caprice, RN   02/26/2021 1126 Rate/Dose Change (none) Intravenous Isabel Caprice, RN   02/26/2021 1125 Rate/Dose Change (none) Intravenous Isabel Caprice, RN    02/26/2021 1121 Rate/Dose Change (none) Intravenous Isabel Caprice, RN   02/26/2021 1045 Infusion Verify (none) Intravenous Isabel Caprice, RN      0.9 %  sodium chloride infusion     Date Action Dose Route User   03/19/2021 1052 Rate/Dose Change (none) Intravenous Sinda Du, RN   03/19/2021 1019 Infusion Verify (none) Intravenous Sinda Du, RN   03/19/2021 1016 Infusion Verify (none) Intravenous Sinda Du, RN   03/19/2021 1016 Rate/Dose Change (none) Intravenous Sinda Du, RN   03/19/2021 1013 Rate/Dose Change (none) Intravenous Sinda Du, RN      Zoledronic Acid (ZOMETA) IVPB 4 mg     Date Action Dose Route User   02/26/2021 1031 Infusion Verify (none) Intravenous Isabel Caprice, RN   02/26/2021 1010 Infusion Verify (none) Intravenous Isabel Caprice, RN   02/26/2021 1010 New Bag/Given 4 mg Intravenous Isabel Caprice, RN       No flowsheet data found.  No results found for: NITRICOXIDE      Assessment & Plan:   Pleural effusion on left Recurrent left pleural effusion presumed malignant with in the setting of metastatic renal cell carcinoma with pulmonary and brain mets. Effusion is stable.  Pleurx with no further pleural drainage x4 weeks. Clinically patient is improved.  He is undergoing active therapy for his renal cell carcinoma. Patient will be set up for Pleurx removal next week.  We went over the procedure and patient education was given.  Questions were answered.  Plan  Patient Instructions  Drain Pleurx every 3 days  Continue with Pleurx care  Will set up Pleurx removal on 9/28 or 9/29 at the hospital in the evening .  Report any signs of infection, drainage, redness, fever.  Follow up with Dr. Valeta Harms in 6 weeks and As needed   Please contact office for sooner follow up if symptoms do not improve or worsen or seek emergency care  Kidney cancer, primary, with metastasis from kidney to other  site [facility]) Continue follow-up with oncology and radiation oncology     Rexene Edison, NP 03/28/2021

## 2021-03-28 NOTE — Assessment & Plan Note (Signed)
Continue follow-up with oncology and radiation oncology

## 2021-03-28 NOTE — Patient Instructions (Signed)
Drain Pleurx every 3 days  Continue with Pleurx care  Will set up Pleurx removal on 9/28 or 9/29 at the hospital in the evening .  Report any signs of infection, drainage, redness, fever.  Follow up with Dr. Valeta Harms in 6 weeks and As needed   Please contact office for sooner follow up if symptoms do not improve or worsen or seek emergency care

## 2021-03-28 NOTE — Telephone Encounter (Signed)
Per Dr. Valeta Harms this patient will need to be set up in the Endoscopy suite on 04/04/21 or 04/05/2021 for a pleurex removal. This will be under local anesthesia. Please make sure patient scheduled fore one of the days.

## 2021-03-28 NOTE — Progress Notes (Signed)
@Patient  ID: Dylan Good, male    DOB: [birth year], 50 y.o.   MRN: [id number]  Chief Complaint  Patient presents with   Follow-up    Referring provider: No ref. provider found  HPI: 50 year old male smoker seen for pulmonary consult January 31, 2021 for large left pleural effusion.  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  He received stereotactic radiosurgery to the brain January 29, 2021.  TEST/EVENTS :  /22/2022: Chest x-ray Pleural effusion improved after thoracentesis. The patient's images have been independently reviewed by me.    CT scan chest July 2022: Extensive thoracic metastasis and large left-sided pleural effusion and pleural nodular metastasis.  PET scan on 01/18/2021 shows large right renal mass consistent with primary renal carcinoma, thoracic metastases, left lung lobe and pleural metastases invading the fifth rib, large left pleural effusion, right lung nodular metastases, bulky mediastinal paratracheal metastatic adenopathy, left suprarenal oblong mass, small subcutaneous nodule metastases superficial to the right gluteus muscle. 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  Patient has been seen earlier this summer for large left pleural effusion.  Patient has underlying metastatic renal cell adenocarcinoma with pleural-based metastatic disease in the left hemothorax with brain mets.  He underwent multiple thoracentesis.  Cytology January 02, 2021 showed reactive mesothelial cells.  A Pleurx drainage tube was placed February 06, 2021.  Initially Pleurx was draining well.  Patient was seen last month with decreased pleural fluid drainage.  He was  recommended to decrease his pleural drainage to every 3 days.  Patient returns today stating that he is getting no fluid out of his Pleurx for the last 4 weeks.  Patient denies any pain, hemoptysis, weight loss. Chest x-ray today shows stable left pleural effusion.  Stable bilateral pulmonary nodules compatible with metastatic disease and a left upper lobe subpleural mass with erosive features involving the lateral fifth rib. He says his appetite is good.  Energy level is starting to pick up.  Remains weak He says his shortness of breath is a lot better  He continues to follow-up with medical and radiation oncology.  He is undergoing active therapy with Ipilimumab 1 mg/kg with nivolumab 3 mg/kg started on January 15, 2021   No Known Allergies  Immunization History  Administered Date(s) Administered   Moderna Sars-Covid-2 Vaccination 07/06/2020   Tdap 09/29/2017    Past Medical History:  Diagnosis Date   Cancer, metastatic to lung ([place])    Renal cell adenocarcinoma, left ([place])     Tobacco History: Social History   Tobacco Use  Smoking Status Some Days   Packs/day: 0.50   Types: Cigarettes  Smokeless Tobacco Never   Ready to quit: Not Answered Counseling given: Not Answered   No outpatient medications prior to visit.   No facility-administered medications prior to visit.     Review of Systems:   Constitutional:   No  weight loss, night sweats,  Fevers, chills, + fatigue, or  lassitude.  HEENT:   No headaches,  Difficulty swallowing,  Tooth/dental problems, or  Sore throat,                No sneezing, itching, ear ache, nasal congestion, post nasal drip,   CV:  No chest pain,  Orthopnea, PND, swelling in lower extremities, anasarca, dizziness, palpitations, syncope.   GI  No heartburn, indigestion, abdominal pain, nausea, vomiting, diarrhea, change in bowel habits, loss of appetite, bloody stools.   Resp:   No chest wall deformity  Skin: no rash or lesions.  GU: no  dysuria, change in color of urine, no urgency or frequency.  No flank pain, no hematuria   MS:  No joint pain or swelling.  No decreased range of motion.  No back pain.    Physical Exam  BP 126/70 (BP Location: Left Arm, Patient Position: Sitting, Cuff Size: Normal)   Pulse (!) 101   Temp 98.2 F (36.8 C) (Oral)   Ht 5\' 9"  (1.753 m)   Wt 185 lb 6.4 oz (84.1 kg)   SpO2 96%   BMI 27.38 kg/m   GEN: A/Ox3; pleasant , NAD, well nourished    HEENT:  [place]/AT,   NOSE-clear, THROAT-clear, no lesions, no postnasal drip or exudate noted.   NECK:  Supple w/ fair ROM; no JVD; normal carotid impulses w/o bruits; no thyromegaly or nodules palpated; no lymphadenopathy.    RESP  Clear  P & A; w/o, wheezes/ rales/ or rhonchi. no accessory muscle use, no dullness to percussion Pleurx on the left lateral chest wall intact, dressing in place.  No surrounding redness  CARD:  RRR, no m/r/g, no peripheral edema, pulses intact, no cyanosis or clubbing.  GI:   Soft & nt; nml bowel sounds; no organomegaly or masses detected.   Musco: Warm bil, no deformities or joint swelling noted.   Neuro: alert, no focal deficits noted.    Skin: Warm, no lesions or rashes    Lab Results:   BNP No results found for: BNP  ProBNP No results found for: PROBNP  Imaging: DG Chest 2 View  Result Date: 03/28/2021 CLINICAL DATA:  Pleural effusion. EXAM: CHEST - 2 VIEW COMPARISON:  02/23/2021 FINDINGS: Left chest tube is identified and appears unchanged in position compared with the previous exam. The left pleural effusion does not appear significantly changed compared with 02/23/2021. Mediastinal adenopathy is again seen. Bilateral pulmonary nodules compatible with metastatic disease . Left upper lobe subpleural mass with erosive features involving the lateral aspect of the left fifth rib is again seen in appears slightly increased from previous exam. IMPRESSION: 1. Unchanged left chest tube with stable volume of  left pleural effusion. 2. Diffuse pulmonary metastasis. 3. Subpleural mass with erosive changes involving the left fifth rib appears increased from previous exam. Electronically Signed   By: Kerby Moors M.D.   On: 03/28/2021 10:42   CT RENAL BIOPSY  Result Date: 03/06/2021 INDICATION: 50 year old gentleman with metastatic renal malignancy presents to IR for biopsy of right renal mass. EXAM: CT-guided biopsy right renal mass. MEDICATIONS: None. ANESTHESIA/SEDATION: Moderate (conscious) sedation was employed during this procedure. A total of Versed 1.5 mg and Fentanyl 75 mcg was administered intravenously. Moderate Sedation Time: 14 minutes. The patient's level of consciousness and vital signs were monitored continuously by radiology nursing throughout the procedure under my direct supervision. COMPLICATIONS: None immediate. PROCEDURE: Informed written consent was obtained from the patient after a thorough discussion of the procedural risks, benefits and alternatives. All questions were addressed. Maximal Sterile Barrier Technique was utilized including caps, mask, sterile gowns, sterile gloves, sterile drape, hand hygiene and skin antiseptic. A timeout was performed prior to the initiation of the procedure. Patient position left lateral decubitus on the CT table. The right lateral  abdominal wall skin prepped and draped in usual fashion. Following local lidocaine administration, 17 gauge introducer needle was advanced into the FDG avid posterior wall of the right renal mass. 4-18 gauge cores were obtained and sent to pathology in formalin. IMPRESSION: CT-guided biopsy of right renal mass as above. Electronically Signed   By: Miachel Roux M.D.   On: 03/06/2021 10:05    diphenhydrAMINE (BENADRYL) injection 25 mg     Date Action Dose Route User   Discharged on 02/06/2021   Admitted on 02/06/2021   02/05/2021 1214 Given 25 mg Intravenous Sinda Du, RN      diphenhydrAMINE (BENADRYL) injection 25 mg      Date Action Dose Route User   02/26/2021 [phone] Given 25 mg Intravenous Isabel Caprice, RN      diphenhydrAMINE (BENADRYL) injection 25 mg     Date Action Dose Route User   03/19/2021 0900 Given 25 mg Intravenous Sinda Du, RN      famotidine (PEPCID) IVPB 20 mg in NS 100 mL IVPB     Date Action Dose Route User   Discharged on 02/06/2021   Admitted on 02/06/2021   02/05/2021 1217 Rate/Dose Change (none) Intravenous Regan Rakers, RN   02/05/2021 1217 New Bag/Given 20 mg Intravenous Sinda Du, RN      famotidine (PEPCID) IVPB 20 mg in NS 100 mL IVPB     Date Action Dose Route User   02/26/2021 [phone] Rate/Dose Change (none) Intravenous Isabel Caprice, RN   02/26/2021 1308 New Bag/Given 20 mg Intravenous Isabel Caprice, RN      famotidine (PEPCID) IVPB 20 mg in NS 100 mL IVPB     Date Action Dose Route User   03/19/2021 0904 Rate/Dose Change (none) Intravenous Sinda Du, RN   03/19/2021 6578 New Bag/Given 20 mg Intravenous Sinda Du, RN      ipilimumab (YERVOY) 85 mg in sodium chloride 0.9 % 50 mL chemo infusion     Date Action Dose Route User   Discharged on 02/06/2021   Admitted on 02/06/2021   02/05/2021 1408 Infusion Verify (none) Intravenous Regan Rakers, RN   02/05/2021 1408 Rate/Dose Change (none) Intravenous Regan Rakers, RN   02/05/2021 1407 New Bag/Given 85 mg Intravenous Otis Brace M, RN      ipilimumab (YERVOY) 85 mg in sodium chloride 0.9 % 50 mL chemo infusion     Date Action Dose Route User   02/26/2021 1132 Infusion Verify (none) Intravenous Isabel Caprice, RN   02/26/2021 1132 Rate/Dose Change (none) Intravenous Isabel Caprice, RN   02/26/2021 1132 New Bag/Given 85 mg Intravenous Isabel Caprice, RN      ipilimumab (YERVOY) 85 mg in sodium chloride 0.9 % 50 mL chemo infusion     Date Action Dose Route User   03/19/2021 1019 Infusion Verify (none) Intravenous Sinda Du, RN    03/19/2021 1019 Rate/Dose Change (none) Intravenous Sinda Du, RN   03/19/2021 1019 New Bag/Given 85 mg Intravenous Otis Brace M, RN      nivolumab (OPDIVO) 240 mg in sodium chloride 0.9 % 100 mL chemo infusion     Date Action Dose Route User   Discharged on 02/06/2021   Admitted on 02/06/2021   02/05/2021 1317 Infusion Verify (none) Intravenous Regan Rakers, RN   02/05/2021 1317 Rate/Dose Change (none) Intravenous Regan Rakers, RN   02/05/2021 1317 New Bag/Given 240 mg Intravenous Sinda Du, RN  nivolumab (OPDIVO) 240 mg in sodium chloride 0.9 % 100 mL chemo infusion     Date Action Dose Route User   02/26/2021 1048 Infusion Verify (none) Intravenous Isabel Caprice, RN   02/26/2021 1047 Rate/Dose Change (none) Intravenous Isabel Caprice, RN   02/26/2021 1047 New Bag/Given 240 mg Intravenous Isabel Caprice, RN      nivolumab (OPDIVO) 240 mg in sodium chloride 0.9 % 100 mL chemo infusion     Date Action Dose Route User   03/19/2021 0938 Infusion Verify (none) Intravenous Sinda Du, RN   03/19/2021 6568 New Bag/Given 240 mg Intravenous Sinda Du, RN      0.9 %  sodium chloride infusion     Date Action Dose Route User   Discharged on 02/06/2021   Admitted on 02/06/2021   02/05/2021 1437 Rate/Dose Change (none) Intravenous Regan Rakers, RN   02/05/2021 1352 Infusion Verify (none) Intravenous Regan Rakers, RN   02/05/2021 1352 Rate/Dose Change (none) Intravenous Regan Rakers, RN   02/05/2021 1347 Rate/Dose Change (none) Intravenous Regan Rakers, RN   02/05/2021 1317 Infusion Verify (none) Intravenous [place], Delfin Gant, RN      0.9 %  sodium chloride infusion     Date Action Dose Route User   02/26/2021 1207 Rate/Dose Change (none) Intravenous Isabel Caprice, RN   02/26/2021 1126 Rate/Dose Change (none) Intravenous Isabel Caprice, RN   02/26/2021 1125 Rate/Dose Change (none) Intravenous Isabel Caprice, RN    02/26/2021 1121 Rate/Dose Change (none) Intravenous Isabel Caprice, RN   02/26/2021 1045 Infusion Verify (none) Intravenous Isabel Caprice, RN      0.9 %  sodium chloride infusion     Date Action Dose Route User   03/19/2021 1052 Rate/Dose Change (none) Intravenous Sinda Du, RN   03/19/2021 1019 Infusion Verify (none) Intravenous Sinda Du, RN   03/19/2021 1016 Infusion Verify (none) Intravenous Sinda Du, RN   03/19/2021 1016 Rate/Dose Change (none) Intravenous Sinda Du, RN   03/19/2021 1013 Rate/Dose Change (none) Intravenous Sinda Du, RN      Zoledronic Acid (ZOMETA) IVPB 4 mg     Date Action Dose Route User   02/26/2021 1031 Infusion Verify (none) Intravenous Isabel Caprice, RN   02/26/2021 1010 Infusion Verify (none) Intravenous Isabel Caprice, RN   02/26/2021 1010 New Bag/Given 4 mg Intravenous Isabel Caprice, RN       No flowsheet data found.  No results found for: NITRICOXIDE      Assessment & Plan:   Pleural effusion on left Recurrent left pleural effusion presumed malignant with in the setting of metastatic renal cell carcinoma with pulmonary and brain mets. Effusion is stable.  Pleurx with no further pleural drainage x4 weeks. Clinically patient is improved.  He is undergoing active therapy for his renal cell carcinoma. Patient will be set up for Pleurx removal next week.  We went over the procedure and patient education was given.  Questions were answered.  Plan  Patient Instructions  Drain Pleurx every 3 days  Continue with Pleurx care  Will set up Pleurx removal on 9/28 or 9/29 at the hospital in the evening .  Report any signs of infection, drainage, redness, fever.  Follow up with Dr. Valeta Harms in 6 weeks and As needed   Please contact office for sooner follow up if symptoms do not improve or worsen or seek emergency care  Kidney cancer, primary, with metastasis from kidney to other  site [facility]) Continue follow-up with oncology and radiation oncology     Rexene Edison, NP 03/28/2021

## 2021-03-28 NOTE — Assessment & Plan Note (Signed)
Recurrent left pleural effusion presumed malignant with in the setting of metastatic renal cell carcinoma with pulmonary and brain mets. Effusion is stable.  Pleurx with no further pleural drainage x4 weeks. Clinically patient is improved.  He is undergoing active therapy for his renal cell carcinoma. Patient will be set up for Pleurx removal next week.  We went over the procedure and patient education was given.  Questions were answered.  Plan  Patient Instructions  Drain Pleurx every 3 days  Continue with Pleurx care  Will set up Pleurx removal on 9/28 or 9/29 at the hospital in the evening .  Report any signs of infection, drainage, redness, fever.  Follow up with Dr. Valeta Harms in 6 weeks and As needed   Please contact office for sooner follow up if symptoms do not improve or worsen or seek emergency care

## 2021-04-03 ENCOUNTER — Encounter: Payer: Self-pay | Admitting: Oncology

## 2021-04-03 NOTE — Telephone Encounter (Signed)
Called and spoke with Endo to get patient scheduled for Pleurx removal. Patient has been scheduled for 04/04/21 at 2pm. I called and gave him the information, he repeated it back to me and expressed understanding. Will route to Dr. Valeta Harms as Juluis Rainier. Nothing further needed at this time.

## 2021-04-04 ENCOUNTER — Encounter (HOSPITAL_COMMUNITY)
Admission: RE | Disposition: A | Discharge status: Home / Self Care | Payer: Self-pay | Source: Home / Self Care | Attending: Pulmonary Disease

## 2021-04-04 ENCOUNTER — Ambulatory Visit (HOSPITAL_COMMUNITY)
Admission: RE | Admit: 2021-04-04 | Discharge: 2021-04-04 | Disposition: A | Discharge status: Home / Self Care | Payer: Medicaid Other | Attending: Pulmonary Disease | Admitting: Pulmonary Disease

## 2021-04-04 ENCOUNTER — Encounter (HOSPITAL_COMMUNITY): Payer: Self-pay | Admitting: Pulmonary Disease

## 2021-04-04 ENCOUNTER — Ambulatory Visit (HOSPITAL_COMMUNITY): Payer: Medicaid Other

## 2021-04-04 DIAGNOSIS — C7931 Secondary malignant neoplasm of brain: Secondary | ICD-10-CM | POA: Insufficient documentation

## 2021-04-04 DIAGNOSIS — Z9689 Presence of other specified functional implants: Secondary | ICD-10-CM

## 2021-04-04 DIAGNOSIS — F17210 Nicotine dependence, cigarettes, uncomplicated: Secondary | ICD-10-CM | POA: Insufficient documentation

## 2021-04-04 DIAGNOSIS — I509 Heart failure, unspecified: Secondary | ICD-10-CM

## 2021-04-04 DIAGNOSIS — C642 Malignant neoplasm of left kidney, except renal pelvis: Secondary | ICD-10-CM | POA: Insufficient documentation

## 2021-04-04 DIAGNOSIS — C7800 Secondary malignant neoplasm of unspecified lung: Secondary | ICD-10-CM | POA: Insufficient documentation

## 2021-04-04 DIAGNOSIS — J90 Pleural effusion, not elsewhere classified: Secondary | ICD-10-CM | POA: Insufficient documentation

## 2021-04-04 DIAGNOSIS — C7989 Secondary malignant neoplasm of other specified sites: Secondary | ICD-10-CM | POA: Insufficient documentation

## 2021-04-04 DIAGNOSIS — Z4682 Encounter for fitting and adjustment of non-vascular catheter: Secondary | ICD-10-CM | POA: Insufficient documentation

## 2021-04-04 HISTORY — PX: CHEST TUBE INSERTION: SHX231

## 2021-04-04 IMAGING — DX DG CHEST 1V PORT
1 series · 1 of 1 positions shown · non-contrast
Comparison: Chest radiograph [DATE]

CLINICAL DATA: Status post removal of PleurX catheter

EXAM:
PORTABLE CHEST 1 VIEW

[chest ap]
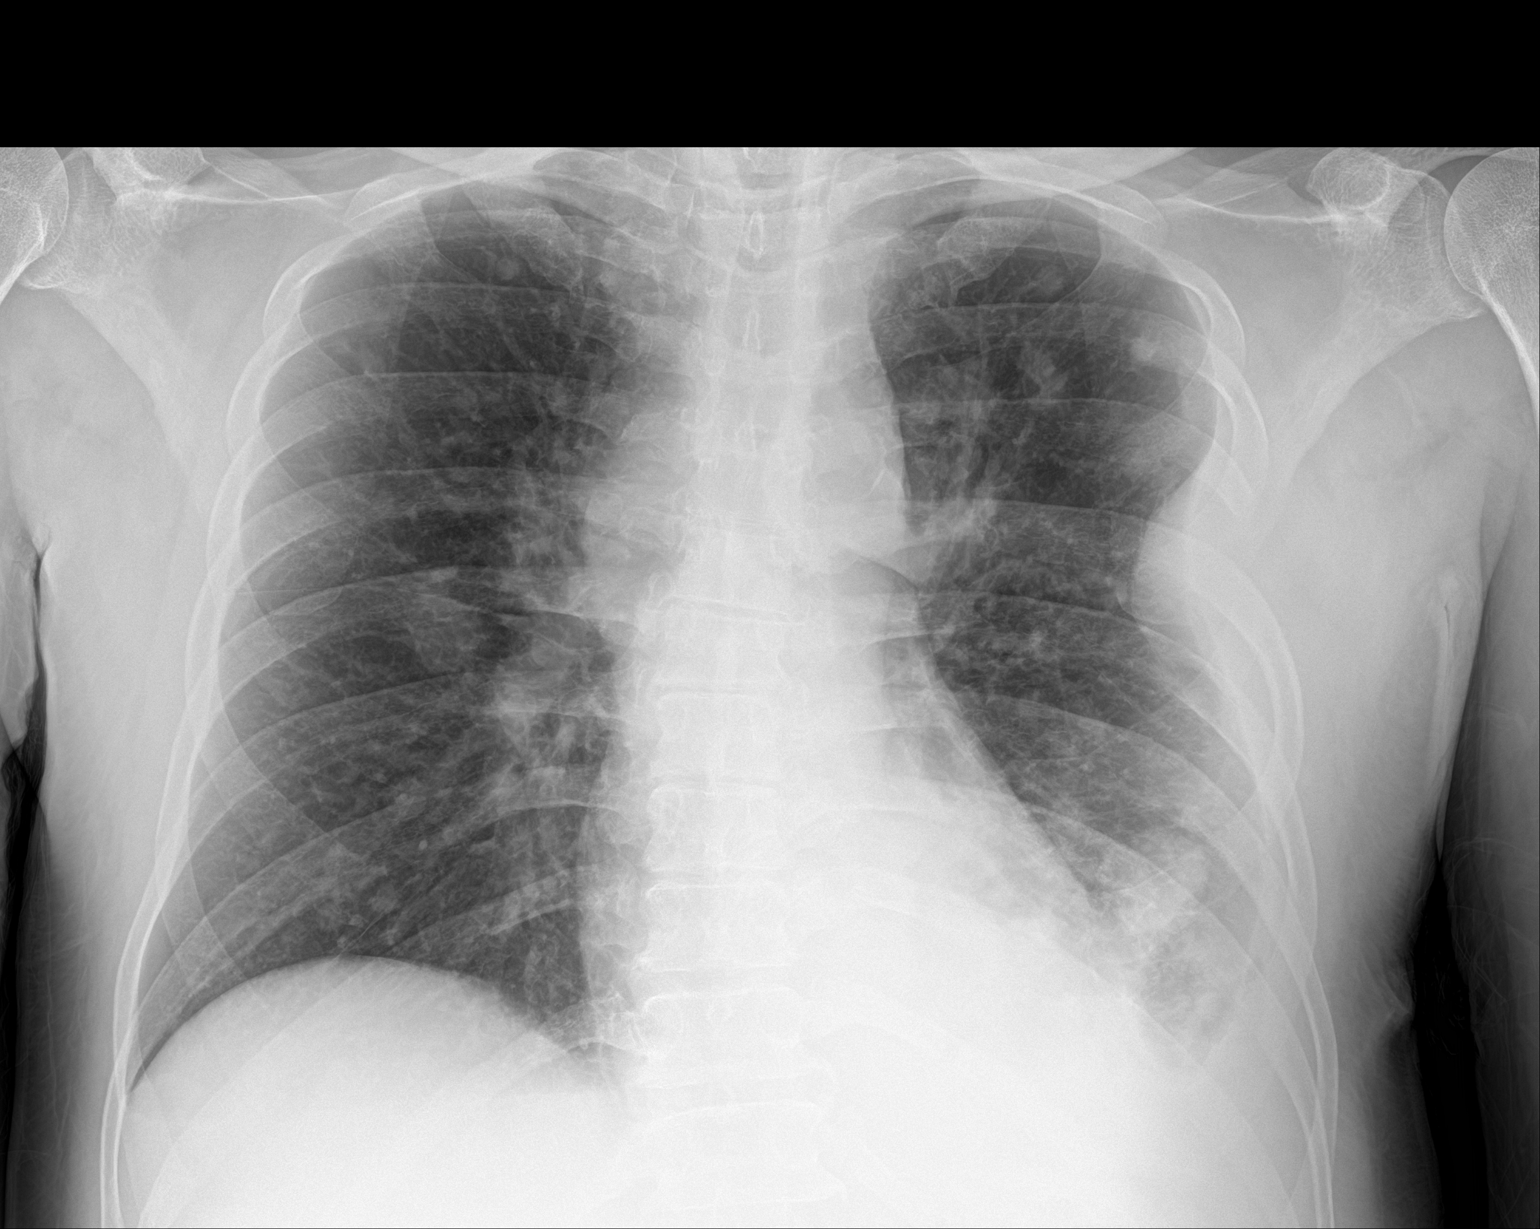

[1 of 1 positions shown; findings below may reference images not displayed]

FINDINGS: The left-sided chest tube has been removed.

The cardiomediastinal silhouette is stable.

There is a residual left pleural effusion, similar in volume to the
prior study. Aeration of the left lung is slightly improved in the
interim. Multiple nodules are again seen throughout both lungs
consistent with pulmonary metastases. There is no new focal airspace
disease on the right. There is no right pleural effusion. There is
no pneumothorax.

There is masslike opacity along the left chest wall with apparent
obliteration of a portion of the left anterolateral fifth rib
suspicious for worsened destruction from a metastatic lesion in this
location.
IMPRESSION: 1. Interval removal of the left-sided chest tube with no
pneumothorax.
2. Grossly similar volume of the left pleural effusion with improved
aeration of the left lung.
3. increasing left chest wall metastatic lesion with destruction of
the left fifth rib.
4. Multiple pulmonary metastatic lesions again seen.

## 2021-04-04 SURGERY — INSERTION, PLEURAL DRAINAGE CATHETER

## 2021-04-04 MED ORDER — LIDOCAINE HCL (PF) 1 % IJ SOLN
INTRAMUSCULAR | Status: AC
  Filled 2021-04-04: qty 30

## 2021-04-04 NOTE — Discharge Instructions (Signed)
Basic wound care Can apply bandaid to insertion site.

## 2021-04-04 NOTE — Op Note (Signed)
PleurX Removal Procedure Note  Dylan Good  [id number]  01-14-1971  Date:04/04/21  Time:2:46 PM   Provider Performing:Azreal Stthomas L Nilam Quakenbush  Procedure: removal of indwelling pleural catheter with cuff   Indication(s) Nonfunctional drainage of catheter   Consent Risks of the procedure as well as the alternatives and risks of each were explained to the patient and/or caregiver.  Consent for the procedure was obtained.  Anesthesia Topical only with 1% lidocaine   Time Out Verified patient identification, verified procedure, site/side was marked, verified correct patient position, special equipment/implants available, medications/allergies/relevant history reviewed, required imaging and test results available.  Sterile Technique Clean technique  Procedure Description Skin was cleaned with chlorhexidine around the insertion site of the catheter.  Approximately 3 cc of 1% lidocaine was injected in and around the indwelling cuff under the skin.  A pair of tweezers was used to loosen the cuff from the underlying fascia.  Direct retraction of the tube was necessary for removal.  Patient tolerated the procedure well.  The entire indwelling pleural catheter was removed.  Complications/Tolerance None; patient tolerated the procedure well. Chest X-ray is ordered to confirm no post-procedural complication.  EBL Minimal  Specimen(s) none   Garner Nash, DO [facility] 04/04/2021 2:48 PM

## 2021-04-04 NOTE — Interval H&P Note (Signed)
History and Physical Interval Note:  04/04/2021 2:46 PM  Dylan Good  has presented today for surgery, with the diagnosis of pleurex not draining.  The various methods of treatment have been discussed with the patient and family. After consideration of risks, benefits and other options for treatment, the patient has consented to  Procedure(s): removal of pleurex cath (N/A) as a surgical intervention.  The patient's history has been reviewed, patient examined, no change in status, stable for surgery.  I have reviewed the patient's chart and labs.  Questions were answered to the patient's satisfaction.     [place]

## 2021-04-06 ENCOUNTER — Encounter (HOSPITAL_COMMUNITY): Payer: Self-pay | Admitting: Pulmonary Disease

## 2021-04-09 ENCOUNTER — Ambulatory Visit (HOSPITAL_COMMUNITY): Payer: Medicaid Other

## 2021-04-10 ENCOUNTER — Inpatient Hospital Stay: Payer: Medicaid Other | Attending: Oncology

## 2021-04-10 ENCOUNTER — Inpatient Hospital Stay: Payer: Medicaid Other | Admitting: Oncology

## 2021-04-10 ENCOUNTER — Encounter: Payer: Self-pay | Admitting: Oncology

## 2021-04-10 ENCOUNTER — Other Ambulatory Visit: Payer: Self-pay

## 2021-04-10 ENCOUNTER — Inpatient Hospital Stay: Payer: Medicaid Other

## 2021-04-10 VITALS — BP 146/79 | HR 99 | Temp 96.5°F | Resp 17 | Wt 183.6 lb

## 2021-04-10 DIAGNOSIS — Z923 Personal history of irradiation: Secondary | ICD-10-CM | POA: Diagnosis not present

## 2021-04-10 DIAGNOSIS — R059 Cough, unspecified: Secondary | ICD-10-CM | POA: Insufficient documentation

## 2021-04-10 DIAGNOSIS — C641 Malignant neoplasm of right kidney, except renal pelvis: Secondary | ICD-10-CM

## 2021-04-10 DIAGNOSIS — Z5112 Encounter for antineoplastic immunotherapy: Secondary | ICD-10-CM | POA: Insufficient documentation

## 2021-04-10 DIAGNOSIS — N2889 Other specified disorders of kidney and ureter: Secondary | ICD-10-CM

## 2021-04-10 DIAGNOSIS — R918 Other nonspecific abnormal finding of lung field: Secondary | ICD-10-CM

## 2021-04-10 DIAGNOSIS — C7931 Secondary malignant neoplasm of brain: Secondary | ICD-10-CM | POA: Diagnosis not present

## 2021-04-10 DIAGNOSIS — F17210 Nicotine dependence, cigarettes, uncomplicated: Secondary | ICD-10-CM | POA: Insufficient documentation

## 2021-04-10 DIAGNOSIS — J90 Pleural effusion, not elsewhere classified: Secondary | ICD-10-CM | POA: Insufficient documentation

## 2021-04-10 LAB — CMP (CANCER CENTER ONLY)
ALT: 16 U/L (ref 0–44)
AST: 13 U/L — ABNORMAL LOW (ref 15–41)
Albumin: 3.6 g/dL (ref 3.5–5.0)
Alkaline Phosphatase: 63 U/L (ref 38–126)
Anion gap: 7 (ref 5–15)
BUN: 15 mg/dL (ref 6–20)
CO2: 21 mmol/L — ABNORMAL LOW (ref 22–32)
Calcium: 12.7 mg/dL — ABNORMAL HIGH (ref 8.9–10.3)
Chloride: 107 mmol/L (ref 98–111)
Creatinine: 0.83 mg/dL (ref 0.61–1.24)
GFR, Estimated: >60 mL/min (ref >60)
Glucose, Bld: 117 mg/dL — ABNORMAL HIGH (ref 70–99)
Potassium: 3.9 mmol/L (ref 3.5–5.1)
Sodium: 135 mmol/L (ref 135–145)
Total Bilirubin: 0.5 mg/dL (ref 0.3–1.2)
Total Protein: 7.8 g/dL (ref 6.5–8.1)

## 2021-04-10 LAB — CBC WITH DIFFERENTIAL (CANCER CENTER ONLY)
Abs Immature Granulocytes: 0.01 10*3/uL (ref 0.00–0.07)
Basophils Absolute: 0 10*3/uL (ref 0.0–0.1)
Basophils Relative: 1 %
Eosinophils Absolute: 0.1 10*3/uL (ref 0.0–0.5)
Eosinophils Relative: 3 %
HCT: 36.4 % — ABNORMAL LOW (ref 39.0–52.0)
Hemoglobin: 11.6 g/dL — ABNORMAL LOW (ref 13.0–17.0)
Immature Granulocytes: 0 %
Lymphocytes Relative: 22 %
Lymphs Abs: 1 10*3/uL (ref 0.7–4.0)
MCH: 22.6 pg — ABNORMAL LOW (ref 26.0–34.0)
MCHC: 31.9 g/dL (ref 30.0–36.0)
MCV: 70.8 fL — ABNORMAL LOW (ref 80.0–100.0)
Monocytes Absolute: 0.6 10*3/uL (ref 0.1–1.0)
Monocytes Relative: 12 %
Neutro Abs: 2.9 10*3/uL (ref 1.7–7.7)
Neutrophils Relative %: 62 %
Platelet Count: 294 10*3/uL (ref 150–400)
RBC: 5.14 MIL/uL (ref 4.22–5.81)
RDW: 16 % — ABNORMAL HIGH (ref 11.5–15.5)
WBC Count: 4.6 10*3/uL (ref 4.0–10.5)
nRBC: 0 % (ref 0.0–0.2)

## 2021-04-10 MED ORDER — SODIUM CHLORIDE 0.9 % IV SOLN
480.0000 mg | Freq: Once | INTRAVENOUS | Status: AC
  Administered 2021-04-10: 480 mg via INTRAVENOUS
  Filled 2021-04-10: qty 48

## 2021-04-10 MED ORDER — SODIUM CHLORIDE 0.9 % IV SOLN
Freq: Once | INTRAVENOUS | Status: AC
Start: 2021-04-10 — End: 2021-04-10

## 2021-04-10 NOTE — Progress Notes (Signed)
Hematology and Oncology Follow Up Visit  KAYAN BLISSETT [id number] 1970/12/14 50 y.o. 04/10/2021 11:33 AM Patient, No Pcp Per (Inactive)Rylynne Schicker, [name], MD   Principle Diagnosis: 68 year old man with kidney cancer diagnosed in June 2022.  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   Prior Therapy:   He is status post thoracentesis completed in June 2022 and repeated on January 02, 2021.  He is s/p Pleurx catheter insertion in August 2022.  He is status post stereotactic radiosurgery to the brain completed on January 29, 2021.  He received 20 Gray in 1 fraction.  He is status post kidney biopsy completed on March 06, 2021.  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  Ipilimumab 1 mg/kg with nivolumab 3 mg/kg started on January 15, 2021.  He completed 4 cycles of therapy.  Current therapy: Nivolumab 480 mg every 4 weeks to start on April 10, 2021.  Interim History: Mr. Horsey returns today for repeat evaluation.  Since the last visit, he reports no major complaints at this time.  His respiratory status continues to improve and is no longer has a Pleurx catheter.  He denies any nausea, vomiting or abdominal pain.  He denies any shortness of breath or difficulty breathing.  He denies any hospitalizations or illnesses.   Medications: Reviewed without changes.     Allergies: No Known Allergies    Physical Exam:    ECOG: 1    General appearance: Alert, awake without any distress. Head: Atraumatic without abnormalities Oropharynx: Without any thrush or ulcers. Eyes: No scleral icterus. Lymph nodes: No lymphadenopathy noted in the cervical, supraclavicular, or axillary nodes Heart:regular rate and rhythm, without any murmurs or gallops.   Lung: Clear to auscultation without any rhonchi, wheezes or dullness to percussion. Abdomin: Soft, nontender without any shifting dullness or ascites. Musculoskeletal: No clubbing or cyanosis. Neurological:  No motor or sensory deficits. Skin: No rashes or lesions.      Lab Results: Lab Results  Component Value Date   WBC 4.6 04/10/2021   HGB 11.6 (L) 04/10/2021   HCT 36.4 (L) 04/10/2021   MCV 70.8 (L) 04/10/2021   PLT 294 04/10/2021     Chemistry      Component Value Date/Time   NA 135 03/19/2021 0815   K 4.2 03/19/2021 0815   CL 108 03/19/2021 0815   CO2 20 (L) 03/19/2021 0815   BUN 11 03/19/2021 0815   CREATININE 0.79 03/19/2021 0815      Component Value Date/Time   CALCIUM 12.8 (H) 03/19/2021 0815   ALKPHOS 79 03/19/2021 0815   AST 16 03/19/2021 0815   ALT 22 03/19/2021 0815   BILITOT 0.4 03/19/2021 0815         Impression and Plan:   50 year old man with:  1.  Kidney cancer diagnosed in June 2022.  He presented with stage IV clear-cell histology including CNS and pleural metastasis.  He has completed 4 cycles of ipilimumab and nivolumab without any major complaints.  Risks and benefits of continued nivolumab maintenance were reviewed at this time.  Alternative treatment options including oral targeted therapy will be reserved if he has relapsed disease.  He will have staging scans in the near future to update his cancer status.  For the time being he is agreeable to continue with nivolumab.    2.  Pleural effusion: Pleurx catheter removed without any reaccumulation.   3.  Hypercalcemia: Related to malignancy and has received Zometa.  This will  be updated today with the last Zometa given in August 2022.  He is asymptomatic at this time.  4.  CNS metastasis: No active treatment at this time after radiation therapy previously.  He will have an MRI in the future for surveillance purposes.  5.  Antiemetics: No nausea or vomiting reported at this time.  Compazine is available to him.  6.  Goals of care and prognosis: Therapy remains palliative although aggressive measures are warranted at this time given his excellent performance status.  7.  Immune mediated  complications: He has not experienced any issues at this time including pneumonitis, colitis and thyroid disease.     8.  Follow-up: In 4 weeks for the next cycle of nivolumab.   30  minutes were dedicated to this visit.  The time spent on reviewing his disease status, treatment choices and addressing complication related to cancer and cancer therapy. Zola Button, MD 10/4/202211:33 AM

## 2021-04-16 ENCOUNTER — Ambulatory Visit (HOSPITAL_COMMUNITY)
Admission: RE | Admit: 2021-04-16 | Discharge: 2021-04-16 | Disposition: A | Discharge status: Home / Self Care | Payer: Medicaid Other | Source: Ambulatory Visit | Attending: Oncology | Admitting: Oncology

## 2021-04-16 ENCOUNTER — Encounter (HOSPITAL_COMMUNITY): Payer: Self-pay

## 2021-04-16 DIAGNOSIS — C641 Malignant neoplasm of right kidney, except renal pelvis: Secondary | ICD-10-CM

## 2021-04-16 IMAGING — CT CT ABD-PELV W/ CM
2 of 5 series · 11 of 36 positions shown, 13 images · IV contrast (OMNIPAQUE)
Comparison: PET exam from [DATE] and CT of the chest dated
[DATE].

CLINICAL DATA: Urologic cancer, assess treatment response in a
50-year-old male with history of renal cell carcinoma shown to have
metastatic disease of previous imaging.

EXAM:
CT CHEST, ABDOMEN, AND PELVIS WITH CONTRAST
TECHNIQUE: Multidetector CT imaging of the chest, abdomen and pelvis was
performed following the standard protocol during bolus
administration of intravenous contrast.
CONTRAST:  80mL OMNIPAQUE IOHEXOL 350 MG/ML SOLN

[Series 2: cap with · axial · 0.88mm/px · z∈[-429,+131]mm · 8 of 140 slices shown, 10 images]
[im 14/140  mediastinal]
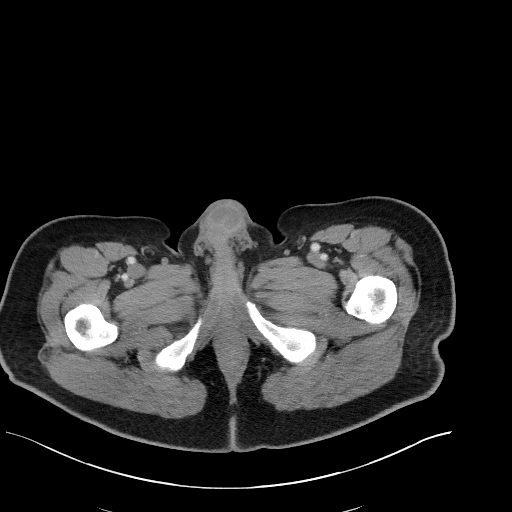
[im 14/140  lung]
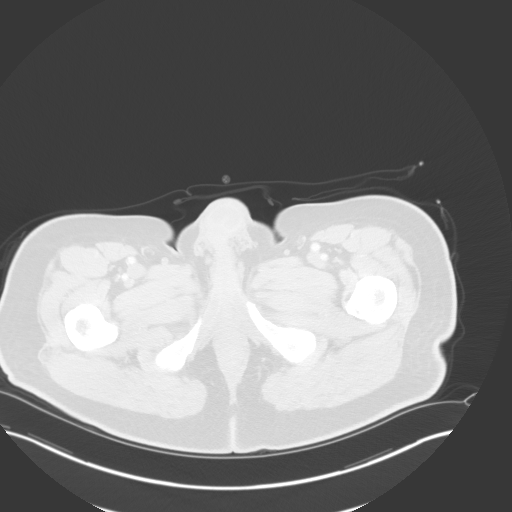
[im 28/140  lung]
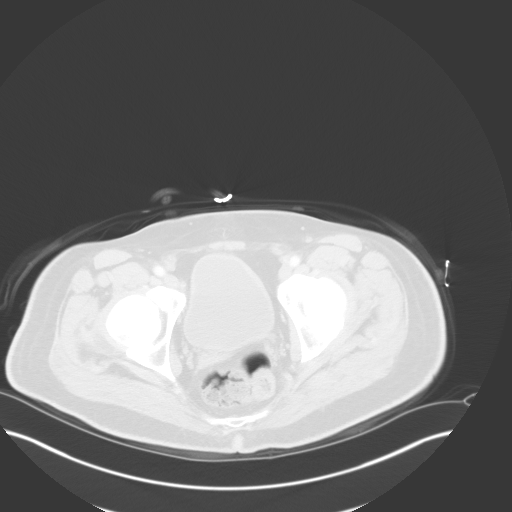
[im 42/140  lung]
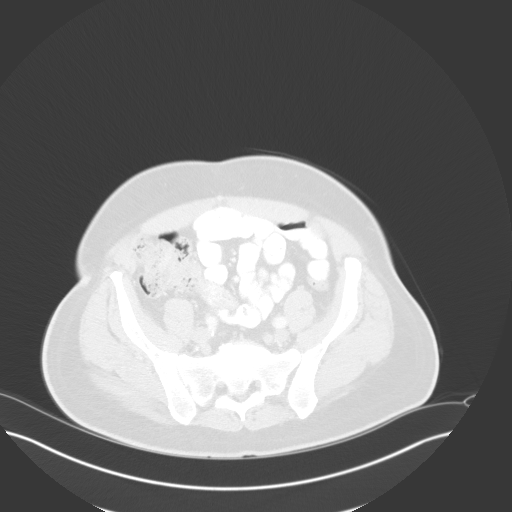
[im 56/140  lung]
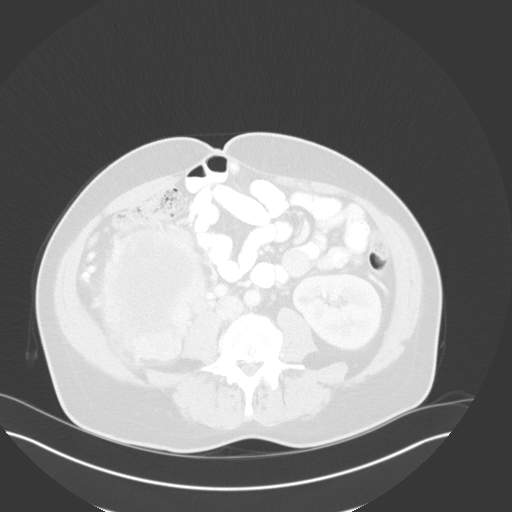
[im 84/140  mediastinal]
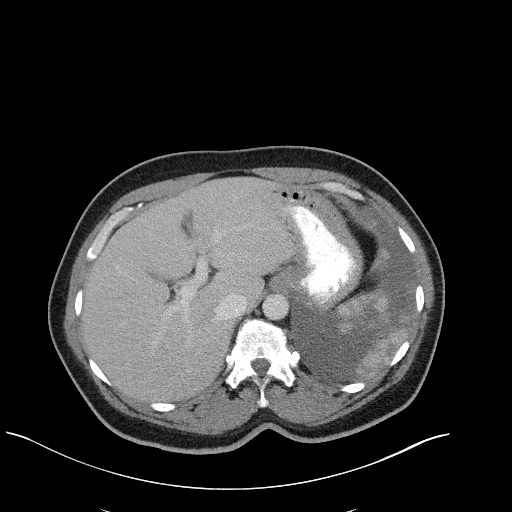
[im 84/140  lung]
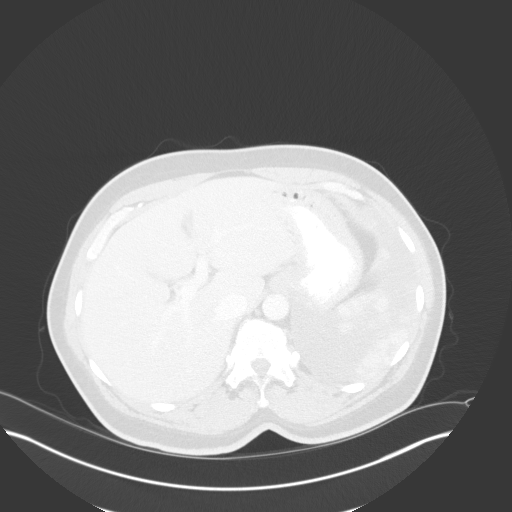
[im 98/140  lung]
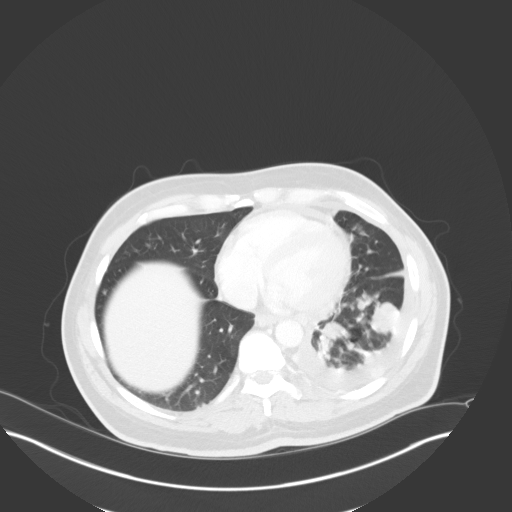
[im 112/140  lung]
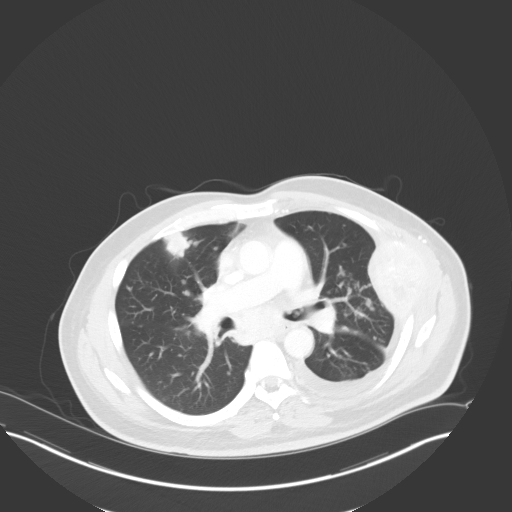
[im 126/140  lung]
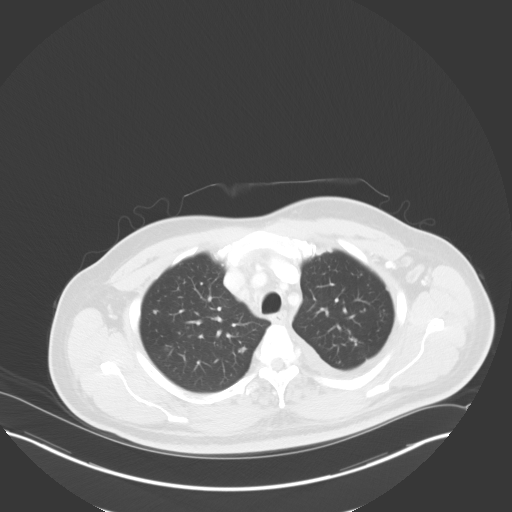

[Series 5: coronals · coronal · 0.83mm/px · 3 of 149 slices shown]
[im 30/149  lung]
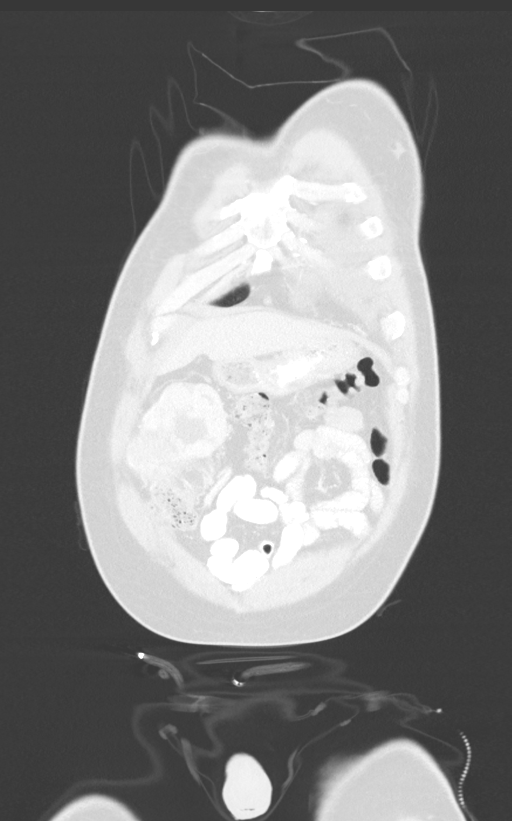
[im 60/149  lung]
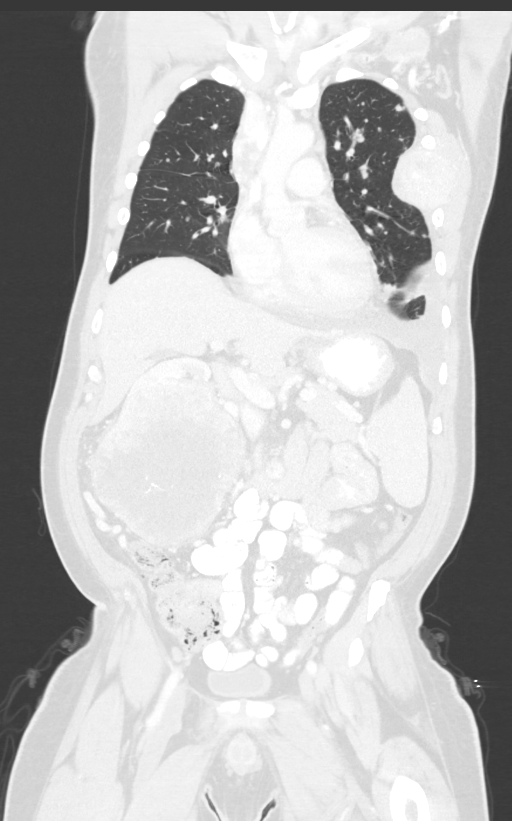
[im 89/149  lung]
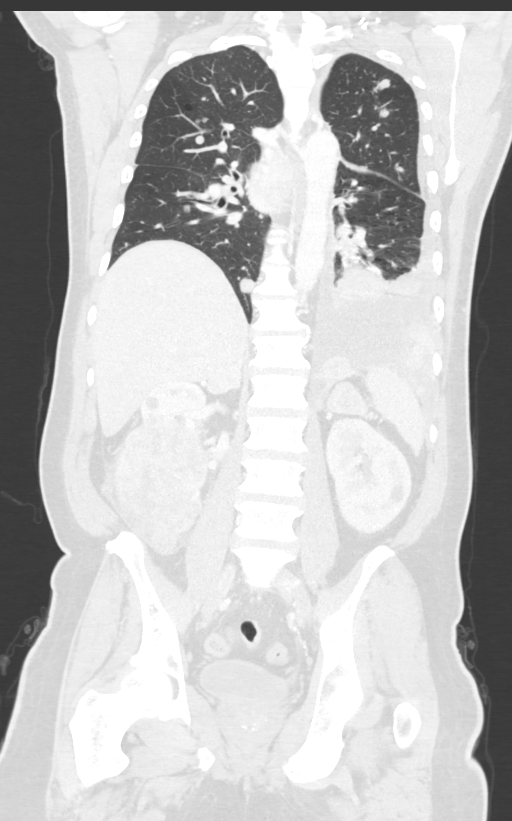

[11 of 36 positions shown; findings below may reference images not displayed]

FINDINGS: CT CHEST FINDINGS

Cardiovascular: Heart size is stable. Small pericardial effusion has
developed since previous imaging. Nodularity is present along the
LEFT cardiac border (image 45/2) 14 x 9 mm lymph node or nodule in
the pre pericardial fat or associated with pericardium, difficult to
assess given the presence of motion in this area.

Extensive pleural nodularity is noted in the LEFT chest. Aorta is
normal caliber with calcified atheromatous plaque in the thoracic
aorta. No aneurysmal dilation. Central pulmonary vasculature is
unremarkable on venous phase.

Mediastinum/Nodes: Bulky subcarinal lymphadenopathy with
heterogeneous appearance (image [DATE]) 5.4 x 3.1 cm previously 4.4 x
2.9 cm.

(Image [DATE]) 3.8 cm RIGHT paratracheal lymph node previously 2.7 cm.
This is heterogeneously enhancing and at least partially necrotic.

(Image [DATE]) RIGHT juxta hilar adenopathy contiguous with RIGHT
paratracheal and precarinal adenopathy potentially larger than on
previous imaging.

(Image [DATE]) 18 mm short axis measurement of an anterior mediastinal
lymph node previously 15 mm.

Lungs/Pleura: Extensive pulmonary metastatic disease.

LEFT parenchymal changes were obscured on the previous study due to
large LEFT-sided effusion.

(Image 106/4) 2.8 x 2.1 cm pulmonary nodule visible now that the
pleural fluid is markedly diminished.

Mass in the LEFT lower lobe also visible today previously obscured
by collapse more than likely at 3.7 x 3.6 cm on today's study.

Additional pulmonary nodules evident throughout the chest including
a nodule in the LEFT upper lobe (image 47/4 measuring 18 mm.

RIGHT lower lobe nodule along the major fissure in the medial and
inferior RIGHT upper lobe measuring 2.1 x 2.1 cm appears similar
size.

Comparison is difficult due to the profound motion artifact present
on the previous study, grossly similar appearance of these nodules.

LEFT sided pleural based disease encompasses approximately 8.6 cm of
the LEFT lower lobe in the periphery measuring approximately 2.2 cm
greatest thickness is very similar with respect to the dominant area
of disease in this location when compared to the prior exam.

Musculoskeletal: Expansile destructive changes along the LEFT chest
wall associated with the LEFT fourth, fifth and sixth ribs but
mainly centered about the LEFT fifth rib measuring 7.2 x 5.1 cm
previously measured approximately 3.1 x 1.4 cm. See below for full
musculoskeletal details.

CT ABDOMEN PELVIS FINDINGS

Hepatobiliary: No focal, suspicious hepatic lesion. No
pericholecystic stranding. No biliary duct dilation. Portal vein is
patent.

Pancreas: Heterogeneous and expanded Normal, without mass,
inflammation or ductal dilatation. Hip down spleen normal size and
contour.

Spleen: Normal.

Adrenals/Urinary Tract:

Adrenal glands with LEFT adrenal mass measuring 6.4 x 2.8 cm,
previously 6.1 x 2.7 cm.

RIGHT adrenal lesion (image 63/2) 3.4 x 2.0 cm previously 2.2 x
cm.

Heterogeneous centrally necrotic and centrally partially calcified
mass arising from the RIGHT kidney displacing bowel loops anteriorly
measures 15 x 13.5 cm greatest axial dimension, previously
approximately 14 x 12.3 cm. Extensive collateral pathways about the
RIGHT kidney as on previous imaging.

Retroperitoneal adenopathy associated with heterogeneous enhancement
anterior to the aorta (image 78/2) 16 mm as compared to 17 mm short
axis.

LEFT renal mass measures 2.9 x 2.9 cm present on the previous study
and little changed accounting for lack of contrast on the previous
exam. Cyst in the interpolar LEFT kidney.

Stomach/Bowel: No acute gastrointestinal process. Post appendectomy.
Bowel displaced in the RIGHT hemiabdomen by large RIGHT renal mass.

Vascular/Lymphatic: Heterogeneous pre aortic nodal enlargement as
described. Numerous collateral pathways about the RIGHT kidney as
before. Aortic atherosclerosis. Smooth contour the IVC. No signs of
pelvic adenopathy.

Reproductive: Unremarkable by CT.

Other: No ascites.  No free air.

Musculoskeletal: Destructive changes about the LEFT lateral fifth
rib associated with enlarging mass lesion in this location.

Signs of sacroiliitis as before.
IMPRESSION: Enlarging LEFT chest wall mass with enlarging lytic changes about
the LEFT lateral fifth rib.

Enlargement of RIGHT hilar and paratracheal adenopathy as well as
mild enlargement of subcarinal nodal tissue.

Marked interval decrease in size of LEFT pleural effusion. Pulmonary
disease now likely more visible given re-expansion of the LEFT
chest. For this reason assessment of worsening or stability of
pulmonary disease in the LEFT chest is not possible, attention on
follow-up. Similar appearance of disease in the RIGHT lung
parenchyma compared to the previous study accounting for respiratory
motion on the prior exam

Large RIGHT renal mass perhaps slightly larger than on the previous
study.

Enlarging bilateral adrenal metastases.

LEFT renal mass either synchronous primary or metastatic lesion is
grossly unchanged.

Findings of sacroiliitis as before.

Aortic Atherosclerosis ([SU]-[SU]).

## 2021-04-16 MED ORDER — IOHEXOL 350 MG/ML SOLN
80.0000 mL | Freq: Once | INTRAVENOUS | Status: AC | PRN
  Administered 2021-04-16: 80 mL via INTRAVENOUS

## 2021-04-17 ENCOUNTER — Encounter: Payer: Self-pay | Admitting: Oncology

## 2021-05-03 ENCOUNTER — Encounter: Payer: Self-pay | Admitting: Oncology

## 2021-05-03 ENCOUNTER — Other Ambulatory Visit: Payer: Self-pay

## 2021-05-03 ENCOUNTER — Ambulatory Visit
Admission: RE | Admit: 2021-05-03 | Discharge: 2021-05-03 | Disposition: A | Discharge status: Home / Self Care | Payer: Self-pay | Source: Ambulatory Visit | Attending: Radiation Oncology | Admitting: Radiation Oncology

## 2021-05-03 DIAGNOSIS — C7949 Secondary malignant neoplasm of other parts of nervous system: Secondary | ICD-10-CM

## 2021-05-03 DIAGNOSIS — C7931 Secondary malignant neoplasm of brain: Secondary | ICD-10-CM

## 2021-05-04 ENCOUNTER — Telehealth: Payer: Self-pay | Admitting: *Deleted

## 2021-05-04 NOTE — Telephone Encounter (Signed)
Notified to see PCP next week if not any better

## 2021-05-04 NOTE — Telephone Encounter (Signed)
Yoshio states he has had a bad cough and congestion for a couple of days. Negative covid test, no fever. Has taken Mucinex with slight relief.

## 2021-05-08 ENCOUNTER — Inpatient Hospital Stay (HOSPITAL_BASED_OUTPATIENT_CLINIC_OR_DEPARTMENT_OTHER): Payer: Medicaid Other | Admitting: Oncology

## 2021-05-08 ENCOUNTER — Other Ambulatory Visit (HOSPITAL_COMMUNITY): Payer: Self-pay

## 2021-05-08 ENCOUNTER — Inpatient Hospital Stay: Payer: Medicaid Other

## 2021-05-08 ENCOUNTER — Telehealth: Payer: Self-pay

## 2021-05-08 ENCOUNTER — Other Ambulatory Visit: Payer: Self-pay

## 2021-05-08 ENCOUNTER — Encounter: Payer: Self-pay | Admitting: Oncology

## 2021-05-08 ENCOUNTER — Inpatient Hospital Stay: Payer: Medicaid Other | Attending: Oncology

## 2021-05-08 VITALS — BP 147/83 | HR 97 | Temp 99.1°F | Resp 17 | Ht 69.0 in | Wt 176.8 lb

## 2021-05-08 DIAGNOSIS — N2889 Other specified disorders of kidney and ureter: Secondary | ICD-10-CM

## 2021-05-08 DIAGNOSIS — C7931 Secondary malignant neoplasm of brain: Secondary | ICD-10-CM | POA: Diagnosis not present

## 2021-05-08 DIAGNOSIS — J90 Pleural effusion, not elsewhere classified: Secondary | ICD-10-CM | POA: Diagnosis not present

## 2021-05-08 DIAGNOSIS — R059 Cough, unspecified: Secondary | ICD-10-CM | POA: Diagnosis not present

## 2021-05-08 DIAGNOSIS — Z923 Personal history of irradiation: Secondary | ICD-10-CM | POA: Diagnosis not present

## 2021-05-08 DIAGNOSIS — R918 Other nonspecific abnormal finding of lung field: Secondary | ICD-10-CM

## 2021-05-08 DIAGNOSIS — C7800 Secondary malignant neoplasm of unspecified lung: Secondary | ICD-10-CM | POA: Insufficient documentation

## 2021-05-08 DIAGNOSIS — C641 Malignant neoplasm of right kidney, except renal pelvis: Secondary | ICD-10-CM | POA: Insufficient documentation

## 2021-05-08 DIAGNOSIS — Z5112 Encounter for antineoplastic immunotherapy: Secondary | ICD-10-CM | POA: Insufficient documentation

## 2021-05-08 DIAGNOSIS — F17210 Nicotine dependence, cigarettes, uncomplicated: Secondary | ICD-10-CM | POA: Diagnosis not present

## 2021-05-08 LAB — CBC WITH DIFFERENTIAL (CANCER CENTER ONLY)
Abs Immature Granulocytes: 0.01 10*3/uL (ref 0.00–0.07)
Basophils Absolute: 0 10*3/uL (ref 0.0–0.1)
Basophils Relative: 1 %
Eosinophils Absolute: 0.1 10*3/uL (ref 0.0–0.5)
Eosinophils Relative: 4 %
HCT: 36.9 % — ABNORMAL LOW (ref 39.0–52.0)
Hemoglobin: 11.6 g/dL — ABNORMAL LOW (ref 13.0–17.0)
Immature Granulocytes: 0 %
Lymphocytes Relative: 25 %
Lymphs Abs: 1 10*3/uL (ref 0.7–4.0)
MCH: 22.4 pg — ABNORMAL LOW (ref 26.0–34.0)
MCHC: 31.4 g/dL (ref 30.0–36.0)
MCV: 71.4 fL — ABNORMAL LOW (ref 80.0–100.0)
Monocytes Absolute: 0.6 10*3/uL (ref 0.1–1.0)
Monocytes Relative: 14 %
Neutro Abs: 2.3 10*3/uL (ref 1.7–7.7)
Neutrophils Relative %: 56 %
Platelet Count: 294 10*3/uL (ref 150–400)
RBC: 5.17 MIL/uL (ref 4.22–5.81)
RDW: 16.5 % — ABNORMAL HIGH (ref 11.5–15.5)
WBC Count: 4 10*3/uL (ref 4.0–10.5)
nRBC: 0 % (ref 0.0–0.2)

## 2021-05-08 LAB — CMP (CANCER CENTER ONLY)
ALT: 20 U/L (ref 0–44)
AST: 15 U/L (ref 15–41)
Albumin: 3.5 g/dL (ref 3.5–5.0)
Alkaline Phosphatase: 69 U/L (ref 38–126)
Anion gap: 7 (ref 5–15)
BUN: 10 mg/dL (ref 6–20)
CO2: 22 mmol/L (ref 22–32)
Calcium: 13.9 mg/dL (ref 8.9–10.3)
Chloride: 107 mmol/L (ref 98–111)
Creatinine: 0.82 mg/dL (ref 0.61–1.24)
GFR, Estimated: >60 mL/min (ref >60)
Glucose, Bld: 105 mg/dL — ABNORMAL HIGH (ref 70–99)
Potassium: 4 mmol/L (ref 3.5–5.1)
Sodium: 136 mmol/L (ref 135–145)
Total Bilirubin: 0.7 mg/dL (ref 0.3–1.2)
Total Protein: 7.7 g/dL (ref 6.5–8.1)

## 2021-05-08 LAB — TSH: TSH: 0.584 u[IU]/mL (ref 0.320–4.118)

## 2021-05-08 MED ORDER — HYDROCODONE BIT-HOMATROP MBR 5-1.5 MG/5ML PO SOLN: 5.0000 mL | Freq: Four times a day (QID) | ORAL | 0 refills | Status: DC | PRN

## 2021-05-08 MED ORDER — CABOMETYX 40 MG PO TABS
40.0000 mg | ORAL_TABLET | Freq: Every day | ORAL | 1 refills | Status: DC
  Filled 2021-05-08 – 2021-05-10 (×2): qty 30, 30d supply, fill #0
  Filled 2021-05-29: qty 30, 30d supply, fill #1

## 2021-05-08 MED ORDER — SODIUM CHLORIDE 0.9 % IV SOLN
Freq: Once | INTRAVENOUS | Status: AC
Start: 2021-05-08 — End: 2021-05-08

## 2021-05-08 MED ORDER — SODIUM CHLORIDE 0.9 % IV SOLN
480.0000 mg | Freq: Once | INTRAVENOUS | Status: AC
  Administered 2021-05-08: 480 mg via INTRAVENOUS
  Filled 2021-05-08: qty 48

## 2021-05-08 MED ORDER — ZOLEDRONIC ACID 4 MG/100ML IV SOLN
4.0000 mg | Freq: Once | INTRAVENOUS | Status: AC
  Administered 2021-05-08: 4 mg via INTRAVENOUS
  Filled 2021-05-08: qty 100

## 2021-05-08 NOTE — Telephone Encounter (Signed)
Oral Oncology Patient Advocate Encounter  After completing a benefits investigation, prior authorization for Cabometyx is not required at this time through [employer].  Patient's copay is $4 (after cost exceeds max override).     [place] [facility] [place] Phone [phone] Fax [phone] 05/08/2021 9:35 AM

## 2021-05-08 NOTE — Progress Notes (Signed)
Hematology and Oncology Follow Up Visit  Dylan Good [id number] 07-Oct-1970 50 y.o. 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  He had presented with pulmonary involvement and subsequently CNS disease.   Prior Therapy:   He is status post thoracentesis completed in June 2022 and repeated on January 02, 2021.  He is s/p Pleurx catheter insertion in August 2022.  He is status post stereotactic radiosurgery to the brain completed on January 29, 2021.  He received 20 Gray in 1 fraction.  He is status post kidney biopsy completed on March 06, 2021.  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  Ipilimumab 1 mg/kg with nivolumab 3 mg/kg started on January 15, 2021.  He completed 4 cycles of therapy.  Current therapy: Nivolumab 480 mg every 4 weeks to start on April 10, 2021.  Interim History: Dylan Good is here for a follow-up visit.  Since the last visit, he reports feeling reasonably well although he has experienced more cough but no dyspnea or shortness of breath.  His cough is reasonably dry without any hemoptysis or hematemesis.  He is eating well and attends activities of daily living.  He denies any worsening bone pain or pathological fractures.   Medications: Updated on review.     Allergies: No Known Allergies    Physical Exam:  Blood pressure (!) 147/83, pulse 97, temperature 99.1 F (37.3 C), temperature source Oral, resp. rate 17, height 5\' 9"  (1.753 m), weight 176 lb 12.8 oz (80.2 kg), SpO2 100 %.   ECOG: 1   General appearance: Comfortable appearing without any discomfort Head: Normocephalic without any trauma Oropharynx: Mucous membranes are moist and pink without any thrush or ulcers. Eyes: Pupils are equal and round reactive to light. Lymph nodes: No cervical, supraclavicular, inguinal or axillary lymphadenopathy.    Heart:regular rate and rhythm.  S1 and S2 without leg edema. Lung: Clear without any rhonchi or wheezes.  No dullness to percussion. Abdomin: Soft, nontender, nondistended with good bowel sounds.  No hepatosplenomegaly. Musculoskeletal: No joint deformity or effusion.  Full range of motion noted. Neurological: No deficits noted on motor, sensory and deep tendon reflex exam. Skin: No petechial rash or dryness.  Appeared moist.        Lab Results: Lab Results  Component Value Date   WBC 4.6 04/10/2021   HGB 11.6 (L) 04/10/2021   HCT 36.4 (L) 04/10/2021   MCV 70.8 (L) 04/10/2021   PLT 294 04/10/2021     Chemistry      Component Value Date/Time   NA 135 04/10/2021 1113   K 3.9 04/10/2021 1113   CL 107 04/10/2021 1113   CO2 21 (L) 04/10/2021 1113   BUN 15 04/10/2021 1113   CREATININE 0.83 04/10/2021 1113      Component Value Date/Time   CALCIUM 12.7 (H) 04/10/2021 1113   ALKPHOS 63 04/10/2021 1113   AST 13 (L) 04/10/2021 1113   ALT 16 04/10/2021 1113   BILITOT 0.5 04/10/2021 1113      IMPRESSION: Enlarging LEFT chest wall mass with enlarging lytic changes about the LEFT lateral fifth rib.   Enlargement of RIGHT hilar and paratracheal adenopathy as well as mild enlargement of subcarinal nodal tissue.   Marked interval decrease in size of LEFT pleural effusion. Pulmonary disease now likely more visible given re-expansion of the LEFT chest. For this reason assessment of worsening or stability  of pulmonary disease in the LEFT chest is not possible, attention on follow-up. Similar appearance of disease in the RIGHT lung parenchyma compared to the previous study accounting for respiratory motion on the prior exam   Large RIGHT renal mass perhaps slightly larger than on the previous study.   Enlarging bilateral adrenal metastases.   LEFT renal mass either synchronous primary or metastatic lesion is grossly unchanged.   Findings of sacroiliitis as before.    Aortic Atherosclerosis (ICD10-I70.0).   Impression and Plan:   50 year old man with:  1.  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   CT scan obtained on April 16, 2021 was personally reviewed and discussed with the patient.  His disease showed mild progression despite combination immunotherapy.  This could be related to better visualization after draining pleural effusion but certainly not dramatically improved.  Risks and benefits of adding Cabometyx to nivolumab were reviewed.  These complications include nausea, abdominal pain and diarrhea were reiterated.  After discussion he is agreeable to proceed.  I urged him to have Imodium ready in case he develops diarrhea.    2.  Pleural effusion: Resolved at this time and Pleurx catheter removed.     3.  Hypercalcemia: Related to malignancy and currently on Zometa.  Last infusion was given on February 26, 2021 and will be repeated today.  4.  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  5.  Cough: Related to his metastatic disease and a pleural effusion.  His pleural effusion has improved although his symptoms has worsened.  He has follow-up with pulmonary medicine next week which I urged him to do so in the meantime I will refill cough medication for him.  6.  Goals of care and prognosis: His disease remains incurable although aggressive measures are warranted given his performance status.  7.  Immune mediated complications: I continue to educate him about potential issues including pneumonitis, colitis and thyroid disease.   8.  Follow-up: He will return in 4 weeks for repeat evaluation.   30  minutes were spent on this encounter.  The time was dedicated to reviewing imaging studies, treatment choices and complications related to therapy. Dylan Button, MD 11/1/20228:38 AM

## 2021-05-08 NOTE — Telephone Encounter (Signed)
Oral Oncology Pharmacist Encounter  Received new prescription for cabometyx (Cabozantinib) for the treatment of stage IV clear-cell renal cell carcinoma in conjunction with nivoumab, planned duration until disease progression or unacceptable toxicity.  Labs from 04/10/2021 assessed, no interventions needed.  Current medication list in Epic reviewed, DDIs with cabometyx identified: none  Evaluated chart and no patient barriers to medication adherence noted.   Patient agreement for treatment documented in MD note on 05/08/2021.  Prescription has been e-scribed to the [facility] for benefits analysis and approval.  Oral Oncology Clinic will continue to follow for insurance authorization, copayment issues, initial counseling and start date.  Drema Halon, PharmD Hematology/Oncology Clinical Pharmacist [facility] [phone] 05/08/2021 9:20 AM

## 2021-05-08 NOTE — Patient Instructions (Signed)
[facility] MEDICAL [facility]COLOGY  Discharge Instructions: Thank you for choosing [facility] to provide your oncology and hematology care.   If you have a lab appointment with the Cancer Center, please go directly to the Cancer Center and check in at the registration area.   Wear comfortable clothing and clothing appropriate for easy access to any Portacath or PICC line.   We strive to give you quality time with your provider. You may need to reschedule your appointment if you arrive late (15 or more minutes).  Arriving late affects you and other patients whose appointments are after yours.  Also, if you miss three or more appointments without notifying the office, you may be dismissed from the clinic at the provider's discretion.      For prescription refill requests, have your pharmacy contact our office and allow 72 hours for refills to be completed.    Today you received the following chemotherapy and/or immunotherapy agents: Nivolumab.      To help prevent nausea and vomiting after your treatment, we encourage you to take your nausea medication as directed.  BELOW ARE SYMPTOMS THAT SHOULD BE REPORTED IMMEDIATELY: *FEVER GREATER THAN 100.4 F (38 C) OR HIGHER *CHILLS OR SWEATING *NAUSEA AND VOMITING THAT IS NOT CONTROLLED WITH YOUR NAUSEA MEDICATION *UNUSUAL SHORTNESS OF BREATH *UNUSUAL BRUISING OR BLEEDING *URINARY PROBLEMS (pain or burning when urinating, or frequent urination) *BOWEL PROBLEMS (unusual diarrhea, constipation, pain near the anus) TENDERNESS IN MOUTH AND THROAT WITH OR WITHOUT PRESENCE OF ULCERS (sore throat, sores in mouth, or a toothache) UNUSUAL RASH, SWELLING OR PAIN  UNUSUAL VAGINAL DISCHARGE OR ITCHING   Items with * indicate a potential emergency and should be followed up as soon as possible or go to the Emergency Department if any problems should occur.  Please show the CHEMOTHERAPY ALERT CARD or IMMUNOTHERAPY ALERT CARD at check-in to  the Emergency Department and triage nurse.  Should you have questions after your visit or need to cancel or reschedule your appointment, please contact [facility]  Dept: [phone]  and follow the prompts.  Office hours are 8:00 a.m. to 4:30 p.m. Monday - Friday. Please note that voicemails left after 4:00 p.m. may not be returned until the following business day.  We are closed weekends and major holidays. You have access to a nurse at all times for urgent questions. Please call the main number to the clinic Dept: [phone] and follow the prompts.   For any non-urgent questions, you may also contact your provider using MyChart. We now offer e-Visits for anyone 18 and older to request care online for non-urgent symptoms. For details visit [email or web address].   Also download the MyChart app! Go to the app store, search "MyChart", open the app, select [facility], and log in with your MyChart username and password.  Due to Covid, a mask is required upon entering the hospital/clinic. If you do not have a mask, one will be given to you upon arrival. For doctor visits, patients may have 1 support person aged 18 or older with them. For treatment visits, patients cannot have anyone with them due to current Covid guidelines and our immunocompromised population.   

## 2021-05-09 ENCOUNTER — Other Ambulatory Visit (HOSPITAL_COMMUNITY): Payer: Self-pay

## 2021-05-10 ENCOUNTER — Other Ambulatory Visit (HOSPITAL_COMMUNITY): Payer: Self-pay

## 2021-05-10 NOTE — Telephone Encounter (Signed)
Oral Chemotherapy Pharmacist Encounter  I spoke with patient for overview of: Cabometyx for the treatment of stage IV clear-cell renal cell carcinoma, planned duration until disease progression or unacceptable toxicity.   Counseled patient on administration, dosing, side effects, monitoring, drug-food interactions, safe handling, storage, and disposal.  Patient will take Cabometyx 40mg  tablets, 1 tablet (40mg ) by mouth once daily on an empty stomach, 1 hour before or 2 hours after a meal.  Patient knows to avoid grapefruit and grapefruit juice.  Cabometyx start date: 05/11/2021  Adverse effects include but are not limited to: diarrhea, nausea, decreased appetite, fatigue, hypertension, hand-foot syndrome, decreased blood counts, and electrolyte abnormalities. Patient will obtain anti diarrheal and alert the office of 4 or more loose stools above baseline.  Patient informed that Cabometyx should be held at least 3 weeks prior to any scheduled surgery (including dental surgery) and not resumed until at least 2 weeks after major surgery and until adequate wound healing is established.  Reviewed with patient importance of keeping a medication schedule and plan for any missed doses. No barriers to medication adherence identified.  Medication reconciliation performed and medication/allergy list updated.  Insurance authorization for Cabometyx has been obtained. Test claim at the pharmacy revealed copayment $4 for 1st fill of 30. This will ship from the [facility] on 05/10/2021 to deliver to patient's home on 05/11/2021.  Patient informed the pharmacy will reach out 5-7 days prior to needing next fill of Cabometyx to coordinate continued medication acquisition to prevent break in therapy.  All questions answered.  Mr. Kant voiced understanding and appreciation.   Medication education handout placed in mail for patient. Patient knows to call the office with questions or  concerns. Oral Chemotherapy Clinic phone number provided to patient.   Drema Halon, PharmD Hematology/Oncology Clinical Pharmacist [facility] [phone] 05/10/2021 9:51 AM

## 2021-05-16 ENCOUNTER — Other Ambulatory Visit: Payer: Self-pay

## 2021-05-16 ENCOUNTER — Ambulatory Visit: Payer: Medicaid Other | Admitting: Pulmonary Disease

## 2021-05-16 ENCOUNTER — Other Ambulatory Visit (HOSPITAL_COMMUNITY): Payer: Self-pay

## 2021-05-16 ENCOUNTER — Encounter: Payer: Self-pay | Admitting: Pulmonary Disease

## 2021-05-16 VITALS — BP 150/82 | HR 102 | Temp 100.2°F | Ht 69.75 in | Wt 180.2 lb

## 2021-05-16 DIAGNOSIS — C641 Malignant neoplasm of right kidney, except renal pelvis: Secondary | ICD-10-CM | POA: Diagnosis not present

## 2021-05-16 DIAGNOSIS — J90 Pleural effusion, not elsewhere classified: Secondary | ICD-10-CM

## 2021-05-16 DIAGNOSIS — C7931 Secondary malignant neoplasm of brain: Secondary | ICD-10-CM | POA: Diagnosis not present

## 2021-05-16 DIAGNOSIS — R062 Wheezing: Secondary | ICD-10-CM | POA: Diagnosis not present

## 2021-05-16 DIAGNOSIS — F17200 Nicotine dependence, unspecified, uncomplicated: Secondary | ICD-10-CM

## 2021-05-16 MED ORDER — ALBUTEROL SULFATE HFA 108 (90 BASE) MCG/ACT IN AERS: 2.0000 | INHALATION_SPRAY | Freq: Four times a day (QID) | RESPIRATORY_TRACT | 6 refills | Status: DC | PRN

## 2021-05-16 MED ORDER — FLUTICASONE FUROATE-VILANTEROL 200-25 MCG/ACT IN AEPB: 1.0000 | INHALATION_SPRAY | Freq: Every day | RESPIRATORY_TRACT | Status: DC

## 2021-05-16 NOTE — Patient Instructions (Addendum)
Thank you for visiting Dr. Valeta Harms at [facility]. Today we recommend the following:  Meds ordered this encounter  Medications   albuterol (VENTOLIN HFA) 108 (90 Base) MCG/ACT inhaler    Sig: Inhale 2 puffs into the lungs every 6 (six) hours as needed for wheezing or shortness of breath.    Dispense:  8 g    Refill:  6   BREO 200 Samples Albuterol solution + Nebulizer supplies   Return in about 3 months (around 08/16/2021) for with Eric Form, NP, or Dr. Valeta Harms.    Please do your part to reduce the spread of COVID-19.

## 2021-05-16 NOTE — Progress Notes (Signed)
Synopsis: Referred in July 2022 for malignant pleural effusion by No ref. provider found  Subjective:   PATIENT ID: Dylan Good GENDER: male DOB: 09/13/1970, MRN: [id number]  Chief Complaint  Patient presents with   Follow-up    Thoracentesis f/u    This is a 50 year old gentleman, new diagnosis of metastatic renal cell carcinoma.  He has a large left-sided pleural effusion recent thoracentesis that had improvement with his shortness of breath.  He is currently started therapy.  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  Recent thoracentesis the patient had 1.2 L of fluid removed from his left chest.  He does however feel like his fluid has returned and does feel short of breath again today in the office but is not as bad as it was.  OV 05/16/2021: here today for follow up after recent IPC removal.  Patient is doing well today.  Has no complaints.  Dealing with the removal of the drain.  No additional soreness left.  Incision sites well-healed.  Small scars present.  He does occasionally wheeze.  He has noticed some increased in shortness of breath.  On examination he is wheezing today   Past Medical History:  Diagnosis Date   Cancer, metastatic to lung [facility])    Renal cell adenocarcinoma, left ([place])      History reviewed. No pertinent family history.   Past Surgical History:  Procedure Laterality Date   APPENDECTOMY     CHEST TUBE INSERTION Left 02/06/2021   Procedure: INSERTION PLEURAL DRAINAGE CATHETER;  Surgeon: Garner Nash, DO;  Location: [place];  Service: Pulmonary;  Laterality: Left;   CHEST TUBE INSERTION N/A 04/04/2021   Procedure: removal of pleurex cath;  Surgeon: Garner Nash, DO;  Location: [place];  Service: Pulmonary;  Laterality: N/A;    Social History   Socioeconomic History   Marital status: Single    Spouse name: Not on file   Number of children: Not on file   Years of education:  Not on file   Highest education level: Not on file  Occupational History   Not on file  Tobacco Use   Smoking status: Some Days    Packs/day: 0.50    Types: Cigarettes   Smokeless tobacco: Never  Vaping Use   Vaping Use: Never used  Substance and Sexual Activity   Alcohol use: Never   Drug use: Never   Sexual activity: Not on file  Other Topics Concern   Not on file  Social History Narrative   Not on file   Social Determinants of Health   Financial Resource Strain: Not on file  Food Insecurity: Not on file  Transportation Needs: Not on file  Physical Activity: Not on file  Stress: Not on file  Social Connections: Not on file  Intimate Partner Violence: Not on file     No Known Allergies   Outpatient Medications Prior to Visit  Medication Sig Dispense Refill   acetaminophen (TYLENOL) 325 MG tablet Take 325 mg by mouth every 6 (six) hours as needed (pain).     cabozantinib (CABOMETYX) 40 MG tablet Take 1 tablet (40 mg total) by mouth daily. Take on an empty stomach, 1 hour before or 2 hours after meals. 30 tablet 1   HYDROcodone bit-homatropine (HYCODAN) 5-1.5 MG/5ML syrup Take 5 mLs by mouth every 6 (six) hours as needed for cough. 120 mL 0   No facility-administered medications prior to  visit.    Review of Systems  Constitutional:  Negative for chills, fever, malaise/fatigue and weight loss.  HENT:  Negative for hearing loss, sore throat and tinnitus.   Eyes:  Negative for blurred vision and double vision.  Respiratory:  Positive for cough, shortness of breath and wheezing. Negative for hemoptysis, sputum production and stridor.   Cardiovascular:  Negative for chest pain, palpitations, orthopnea, leg swelling and PND.  Gastrointestinal:  Negative for abdominal pain, constipation, diarrhea, heartburn, nausea and vomiting.  Genitourinary:  Negative for dysuria, hematuria and urgency.  Musculoskeletal:  Negative for joint pain and myalgias.  Skin:  Negative for itching  and rash.  Neurological:  Negative for dizziness, tingling, weakness and headaches.  Endo/Heme/Allergies:  Negative for environmental allergies. Does not bruise/bleed easily.  Psychiatric/Behavioral:  Negative for depression. The patient is not nervous/anxious and does not have insomnia.   All other systems reviewed and are negative.   Objective:  Physical Exam Vitals reviewed.  Constitutional:      General: He is not in acute distress.    Appearance: He is well-developed.  HENT:     Head: Normocephalic and atraumatic.  Eyes:     General: No scleral icterus.    Conjunctiva/sclera: Conjunctivae normal.     Pupils: Pupils are equal, round, and reactive to light.  Neck:     Vascular: No JVD.     Trachea: No tracheal deviation.  Cardiovascular:     Rate and Rhythm: Normal rate and regular rhythm.     Heart sounds: Normal heart sounds. No murmur heard. Pulmonary:     Effort: Pulmonary effort is normal. No tachypnea, accessory muscle usage or respiratory distress.     Breath sounds: No stridor. Wheezing present. No rhonchi or rales.     Comments: Dimished breath sounds in the left base  Abdominal:     General: Bowel sounds are normal. There is no distension.     Palpations: Abdomen is soft.     Tenderness: There is no abdominal tenderness.  Musculoskeletal:        General: No tenderness.     Cervical back: Neck supple.  Lymphadenopathy:     Cervical: No cervical adenopathy.  Skin:    General: Skin is warm and dry.     Capillary Refill: Capillary refill takes less than 2 seconds.     Findings: No rash.  Neurological:     Mental Status: He is alert and oriented to person, place, and time.  Psychiatric:        Behavior: Behavior normal.     Vitals:   05/16/21 0903  BP: (!) 150/82  Pulse: (!) 102  Temp: 100.2 F (37.9 C)  TempSrc: Oral  SpO2: 99%  Weight: 180 lb 3.2 oz (81.7 kg)  Height: 5' 9.75" (1.772 m)   99% on RA BMI Readings from Last 3 Encounters:  05/16/21  26.04 kg/m  05/08/21 26.11 kg/m  04/10/21 27.11 kg/m   Wt Readings from Last 3 Encounters:  05/16/21 180 lb 3.2 oz (81.7 kg)  05/08/21 176 lb 12.8 oz (80.2 kg)  04/10/21 183 lb 9 oz (83.3 kg)     CBC    Component Value Date/Time   WBC 4.0 05/08/2021 0851   WBC 5.6 03/06/2021 0620   RBC 5.17 05/08/2021 0851   HGB 11.6 (L) 05/08/2021 0851   HCT 36.9 (L) 05/08/2021 0851   PLT 294 05/08/2021 0851   MCV 71.4 (L) 05/08/2021 0851   MCH 22.4 (L) 05/08/2021 2119  MCHC 31.4 05/08/2021 0851   RDW 16.5 (H) 05/08/2021 0851   LYMPHSABS 1.0 05/08/2021 0851   MONOABS 0.6 05/08/2021 0851   EOSABS 0.1 05/08/2021 0851   BASOSABS 0.0 05/08/2021 0851    Chest Imaging: 01/26/2021: Chest x-ray Pleural effusion improved after thoracentesis. The patient's images have been independently reviewed by me.    CT scan chest July 2022: Extensive thoracic metastasis and large left-sided pleural effusion and pleural nodular metastasis. The patient's images have been independently reviewed by me.    Pulmonary Functions Testing Results: No flowsheet data found.  FeNO:   Pathology:   Echocardiogram:   Heart Catheterization:     Assessment & Plan:     ICD-10-CM   1. Pleural effusion on left  J90     2. Primary malignant neoplasm of right kidney with metastasis from kidney to other site [facility])  C64.1     3. Brain metastases ([place])  C79.31     4. Wheezing  R06.2     5. Smoker  F17.200        Discussion:  50 yo M, MPE, brain mets, stage 4 renal cell ?reactive airway disease  Current smoker on occasion  Wheezing today   P: Small effusion on recent ct images The patient's images have been independently reviewed by me.   No need for repeat thora today  Start BREO 200 PRN albuterol  DME supplies for Albuterol nebs  If his effusion recurs we can always put the IPC back  Continue follow up with with med onc RTC 3 months to make sure his breathing has stabillized     Current  Outpatient Medications:    acetaminophen (TYLENOL) 325 MG tablet, Take 325 mg by mouth every 6 (six) hours as needed (pain)., Disp: , Rfl:    cabozantinib (CABOMETYX) 40 MG tablet, Take 1 tablet (40 mg total) by mouth daily. Take on an empty stomach, 1 hour before or 2 hours after meals., Disp: 30 tablet, Rfl: 1   HYDROcodone bit-homatropine (HYCODAN) 5-1.5 MG/5ML syrup, Take 5 mLs by mouth every 6 (six) hours as needed for cough., Disp: 120 mL, Rfl: 0   Garner Nash, DO [facility] 05/16/2021 9:10 AM

## 2021-05-16 NOTE — Addendum Note (Signed)
Addended by: Darliss Ridgel on: 05/16/2021 09:51 AM   Modules accepted: Orders

## 2021-05-18 ENCOUNTER — Other Ambulatory Visit: Payer: Self-pay

## 2021-05-28 ENCOUNTER — Other Ambulatory Visit: Payer: Self-pay | Admitting: Radiation Oncology

## 2021-05-28 DIAGNOSIS — C7931 Secondary malignant neoplasm of brain: Secondary | ICD-10-CM

## 2021-05-28 DIAGNOSIS — C7949 Secondary malignant neoplasm of other parts of nervous system: Secondary | ICD-10-CM

## 2021-05-29 ENCOUNTER — Other Ambulatory Visit: Payer: Self-pay | Admitting: Radiation Therapy

## 2021-05-29 ENCOUNTER — Other Ambulatory Visit (HOSPITAL_COMMUNITY): Payer: Self-pay

## 2021-06-04 NOTE — Progress Notes (Deleted)
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  He had presented with pulmonary involvement and subsequently CNS disease.  PRIOR THERAPY: He is status post thoracentesis completed in June 2022 and repeated on January 02, 2021.   He is s/p Pleurx catheter insertion in August 2022.   He is status post stereotactic radiosurgery to the brain completed on January 29, 2021.  He received 20 Gray in 1 fraction.   He is status post kidney biopsy completed on March 06, 2021.  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   Ipilimumab 1 mg/kg with nivolumab 3 mg/kg started on January 15, 2021.  He completed 4 cycles of therapy  CURRENT THERAPY: Nivolumab 480 mg every 4 weeks to start on April 10, 2021. Carbometyx 40 mg p.o. daily. First dose on ***   INTERVAL HISTORY: Dylan Good 50 y.o. male returns to clinic today for follow-up visit.  The patient was last seen by [name] on 05/08/2021.  At that point time, the patient imaging from October 2022 showed mild disease progression despite combination immunotherapy.  [name] added carbometyx 40 mg p.o. daily to the patient's treatment plan.  His first dose was On***and he has been in this***.  At his last appointment, the patient was also endorsing increased dry cough.  The patient followed up with Dr. Valeta Harms on 05/16/2021.  The patient was feeling fairly well except for increased shortness of breath and wheezing at that appointment, [name] started him on [facility] and also ordered DME supplies for albuterol nebulizers.  The patient's breathing has***in proved at this time.  Otherwise the patient denies any new concerning complaints today.  He denies any fever, chills, night sweats, or unexplained weight loss.  Denies any nausea, vomiting, diarrhea, or constipation.  Denies any urinary symptoms.  Denies any  bone pain.  The patient is independent with his ADLs.  He has a good appetite.  Denies any chest pain.  The patient is here today for evaluation and repeat blood work before starting his next cycle of treatment.  MEDICAL HISTORY: Past Medical History:  Diagnosis Date   Cancer, metastatic to lung [facility])    Renal cell adenocarcinoma, left ([facility])     ALLERGIES:  has No Known Allergies.  MEDICATIONS:  Current Outpatient Medications  Medication Sig Dispense Refill   acetaminophen (TYLENOL) 325 MG tablet Take 325 mg by mouth every 6 (six) hours as needed (pain).     albuterol (VENTOLIN HFA) 108 (90 Base) MCG/ACT inhaler Inhale 2 puffs into the lungs every 6 (six) hours as needed for wheezing or shortness of breath. 8 g 6   cabozantinib (CABOMETYX) 40 MG tablet Take 1 tablet (40 mg total) by mouth daily. Take on an empty stomach, 1 hour before or 2 hours after meals. 30 tablet 1   HYDROcodone bit-homatropine (HYCODAN) 5-1.5 MG/5ML syrup Take 5 mLs by mouth every 6 (six) hours as needed for cough. 120 mL 0   Current Facility-Administered Medications  Medication Dose Route Frequency Provider Last Rate Last Admin   fluticasone furoate-vilanterol (BREO ELLIPTA) 200-25 MCG/ACT 1 puff  1 puff Inhalation Daily Icard, Octavio Graves, DO        SURGICAL HISTORY:  Past Surgical History:  Procedure Laterality Date   APPENDECTOMY     CHEST TUBE INSERTION Left 02/06/2021   Procedure: INSERTION PLEURAL DRAINAGE CATHETER;  Surgeon: Garner Nash, DO;  Location: [facility];  Service: Pulmonary;  Laterality: Left;   CHEST TUBE INSERTION N/A 04/04/2021   Procedure: removal of pleurex cath;  Surgeon: Garner Nash, DO;  Location: [facility];  Service: Pulmonary;  Laterality: N/A;    REVIEW OF SYSTEMS:   Review of Systems  Constitutional: Negative for appetite change, chills, fatigue, fever and unexpected weight change.  HENT:   Negative for mouth sores, nosebleeds, sore throat and trouble swallowing.    Eyes: Negative for eye problems and icterus.  Respiratory: Negative for cough, hemoptysis, shortness of breath and wheezing.   Cardiovascular: Negative for chest pain and leg swelling.  Gastrointestinal: Negative for abdominal pain, constipation, diarrhea, nausea and vomiting.  Genitourinary: Negative for bladder incontinence, difficulty urinating, dysuria, frequency and hematuria.   Musculoskeletal: Negative for back pain, gait problem, neck pain and neck stiffness.  Skin: Negative for itching and rash.  Neurological: Negative for dizziness, extremity weakness, gait problem, headaches, light-headedness and seizures.  Hematological: Negative for adenopathy. Does not bruise/bleed easily.  Psychiatric/Behavioral: Negative for confusion, depression and sleep disturbance. The patient is not nervous/anxious.     PHYSICAL EXAMINATION:  There were no vitals taken for this visit.  ECOG PERFORMANCE STATUS: {CHL ONC ECOG [id number]  Physical Exam  Constitutional: Oriented to person, place, and time and well-developed, well-nourished, and in no distress. No distress.  HENT:  Head: Normocephalic and atraumatic.  Mouth/Throat: Oropharynx is clear and moist. No oropharyngeal exudate.  Eyes: Conjunctivae are normal. Right eye exhibits no discharge. Left eye exhibits no discharge. No scleral icterus.  Neck: Normal range of motion. Neck supple.  Cardiovascular: Normal rate, regular rhythm, normal heart sounds and intact distal pulses.   Pulmonary/Chest: Effort normal and breath sounds normal. No respiratory distress. No wheezes. No rales.  Abdominal: Soft. Bowel sounds are normal. Exhibits no distension and no mass. There is no tenderness.  Musculoskeletal: Normal range of motion. Exhibits no edema.  Lymphadenopathy:    No cervical adenopathy.  Neurological: Alert and oriented to person, place, and time. Exhibits normal muscle tone. Gait normal. Coordination normal.  Skin: Skin is warm and dry.  No rash noted. Not diaphoretic. No erythema. No pallor.  Psychiatric: Mood, memory and judgment normal.  Vitals reviewed.  LABORATORY DATA: Lab Results  Component Value Date   WBC 4.0 05/08/2021   HGB 11.6 (L) 05/08/2021   HCT 36.9 (L) 05/08/2021   MCV 71.4 (L) 05/08/2021   PLT 294 05/08/2021      Chemistry      Component Value Date/Time   NA 136 05/08/2021 0851   K 4.0 05/08/2021 0851   CL 107 05/08/2021 0851   CO2 22 05/08/2021 0851   BUN 10 05/08/2021 0851   CREATININE 0.82 05/08/2021 0851      Component Value Date/Time   CALCIUM 13.9 (HH) 05/08/2021 0851   ALKPHOS 69 05/08/2021 0851   AST 15 05/08/2021 0851   ALT 20 05/08/2021 0851   BILITOT 0.7 05/08/2021 0851       RADIOGRAPHIC STUDIES:  No results found.   ASSESSMENT/PLAN:  50 year old man with:  1.  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     CT scan obtained on April 16, 2021 was personally reviewed and discussed with the patient by [name] at his last apt on 05/08/21.  His disease showed mild progression despite combination immunotherapy. [name] recommended  adding Cabometyx to nivolumab. The patient started this on *** and ***. Labs  were reviewed. Recommend ***    2.  Pleural effusion: Resolved at this time and Pleurx catheter removed.     3.  Hypercalcemia: Related to malignancy and currently on Zometa.  Last infusion was given on February 26, 2021 and will be repeated today.  4.  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  5.  Cough: Related to his metastatic disease and a pleural effusion.  ***   6.  Goals of care and prognosis: His disease remains incurable although aggressive measures are warranted given his performance status.   7.  Immune mediated complications: The patient has been educated about potential issues including pneumonitis, colitis and thyroid disease.   8.  Follow-up: He will return in 4 weeks for  repeat evaluation.   No orders of the defined types were placed in this encounter.    I spent {CHL ONC TIME VISIT - SELTR:[id number]} counseling the patient face to face. The total time spent in the appointment was {CHL ONC TIME VISIT - YSHUO:[id number]}.  Kiwanna Spraker L Hilliary Jock, PA-C 06/04/21

## 2021-06-05 ENCOUNTER — Inpatient Hospital Stay: Payer: Medicaid Other

## 2021-06-05 ENCOUNTER — Inpatient Hospital Stay (HOSPITAL_BASED_OUTPATIENT_CLINIC_OR_DEPARTMENT_OTHER): Payer: Medicaid Other | Admitting: Physician Assistant

## 2021-06-05 ENCOUNTER — Encounter: Payer: Self-pay | Admitting: Physician Assistant

## 2021-06-05 ENCOUNTER — Inpatient Hospital Stay: Payer: Medicaid Other | Admitting: Physician Assistant

## 2021-06-05 ENCOUNTER — Other Ambulatory Visit: Payer: Self-pay

## 2021-06-05 VITALS — BP 156/110 | HR 85 | Temp 97.7°F | Resp 18 | Wt 173.8 lb

## 2021-06-05 VITALS — BP 143/100

## 2021-06-05 DIAGNOSIS — C7931 Secondary malignant neoplasm of brain: Secondary | ICD-10-CM

## 2021-06-05 DIAGNOSIS — Z5112 Encounter for antineoplastic immunotherapy: Secondary | ICD-10-CM | POA: Diagnosis not present

## 2021-06-05 DIAGNOSIS — C641 Malignant neoplasm of right kidney, except renal pelvis: Secondary | ICD-10-CM | POA: Diagnosis not present

## 2021-06-05 DIAGNOSIS — J90 Pleural effusion, not elsewhere classified: Secondary | ICD-10-CM

## 2021-06-05 DIAGNOSIS — R918 Other nonspecific abnormal finding of lung field: Secondary | ICD-10-CM

## 2021-06-05 DIAGNOSIS — N2889 Other specified disorders of kidney and ureter: Secondary | ICD-10-CM

## 2021-06-05 DIAGNOSIS — I10 Essential (primary) hypertension: Secondary | ICD-10-CM | POA: Diagnosis not present

## 2021-06-05 LAB — CMP (CANCER CENTER ONLY)
ALT: 40 U/L (ref 0–44)
AST: 25 U/L (ref 15–41)
Albumin: 3.1 g/dL — ABNORMAL LOW (ref 3.5–5.0)
Alkaline Phosphatase: 129 U/L — ABNORMAL HIGH (ref 38–126)
Anion gap: 10 (ref 5–15)
BUN: 9 mg/dL (ref 6–20)
CO2: 20 mmol/L — ABNORMAL LOW (ref 22–32)
Calcium: 8.8 mg/dL — ABNORMAL LOW (ref 8.9–10.3)
Chloride: 105 mmol/L (ref 98–111)
Creatinine: 0.63 mg/dL (ref 0.61–1.24)
GFR, Estimated: >60 mL/min (ref >60)
Glucose, Bld: 102 mg/dL — ABNORMAL HIGH (ref 70–99)
Potassium: 4 mmol/L (ref 3.5–5.1)
Sodium: 135 mmol/L (ref 135–145)
Total Bilirubin: 0.4 mg/dL (ref 0.3–1.2)
Total Protein: 7.7 g/dL (ref 6.5–8.1)

## 2021-06-05 LAB — CBC WITH DIFFERENTIAL (CANCER CENTER ONLY)
Abs Immature Granulocytes: 0.01 10*3/uL (ref 0.00–0.07)
Basophils Absolute: 0.1 10*3/uL (ref 0.0–0.1)
Basophils Relative: 2 %
Eosinophils Absolute: 0.1 10*3/uL (ref 0.0–0.5)
Eosinophils Relative: 4 %
HCT: 43.2 % (ref 39.0–52.0)
Hemoglobin: 13.2 g/dL (ref 13.0–17.0)
Immature Granulocytes: 0 %
Lymphocytes Relative: 36 %
Lymphs Abs: 1.4 10*3/uL (ref 0.7–4.0)
MCH: 21.7 pg — ABNORMAL LOW (ref 26.0–34.0)
MCHC: 30.6 g/dL (ref 30.0–36.0)
MCV: 71.1 fL — ABNORMAL LOW (ref 80.0–100.0)
Monocytes Absolute: 0.3 10*3/uL (ref 0.1–1.0)
Monocytes Relative: 8 %
Neutro Abs: 2 10*3/uL (ref 1.7–7.7)
Neutrophils Relative %: 50 %
Platelet Count: 404 10*3/uL — ABNORMAL HIGH (ref 150–400)
RBC: 6.08 MIL/uL — ABNORMAL HIGH (ref 4.22–5.81)
RDW: 18.6 % — ABNORMAL HIGH (ref 11.5–15.5)
WBC Count: 4 10*3/uL (ref 4.0–10.5)
nRBC: 0 % (ref 0.0–0.2)

## 2021-06-05 MED ORDER — SODIUM CHLORIDE 0.9 % IV SOLN: Freq: Once | INTRAVENOUS | Status: AC

## 2021-06-05 MED ORDER — AMLODIPINE BESYLATE 5 MG PO TABS: 5.0000 mg | ORAL_TABLET | Freq: Every day | ORAL | 1 refills | Status: DC

## 2021-06-05 MED ORDER — SODIUM CHLORIDE 0.9 % IV SOLN
480.0000 mg | Freq: Once | INTRAVENOUS | Status: AC
  Administered 2021-06-05: 480 mg via INTRAVENOUS
  Filled 2021-06-05: qty 48

## 2021-06-05 NOTE — Patient Instructions (Signed)
[facility]  Discharge Instructions: Thank you for choosing [facility] to provide your oncology and hematology care.   If you have a lab appointment with the Cancer Center, please go directly to the Cancer Center and check in at the registration area.   Wear comfortable clothing and clothing appropriate for easy access to any Portacath or PICC line.   We strive to give you quality time with your provider. You may need to reschedule your appointment if you arrive late (15 or more minutes).  Arriving late affects you and other patients whose appointments are after yours.  Also, if you miss three or more appointments without notifying the office, you may be dismissed from the clinic at the provider's discretion.      For prescription refill requests, have your pharmacy contact our office and allow 72 hours for refills to be completed.    Today you received the following chemotherapy and/or immunotherapy agents: Nivolumab.      To help prevent nausea and vomiting after your treatment, we encourage you to take your nausea medication as directed.  BELOW ARE SYMPTOMS THAT SHOULD BE REPORTED IMMEDIATELY: *FEVER GREATER THAN 100.4 F (38 C) OR HIGHER *CHILLS OR SWEATING *NAUSEA AND VOMITING THAT IS NOT CONTROLLED WITH YOUR NAUSEA MEDICATION *UNUSUAL SHORTNESS OF BREATH *UNUSUAL BRUISING OR BLEEDING *URINARY PROBLEMS (pain or burning when urinating, or frequent urination) *BOWEL PROBLEMS (unusual diarrhea, constipation, pain near the anus) TENDERNESS IN MOUTH AND THROAT WITH OR WITHOUT PRESENCE OF ULCERS (sore throat, sores in mouth, or a toothache) UNUSUAL RASH, SWELLING OR PAIN  UNUSUAL VAGINAL DISCHARGE OR ITCHING   Items with * indicate a potential emergency and should be followed up as soon as possible or go to the Emergency Department if any problems should occur.  Please show the CHEMOTHERAPY ALERT CARD or IMMUNOTHERAPY ALERT CARD at check-in to  the Emergency Department and triage nurse.  Should you have questions after your visit or need to cancel or reschedule your appointment, please contact [facility]  Dept: [phone]  and follow the prompts.  Office hours are 8:00 a.m. to 4:30 p.m. Monday - Friday. Please note that voicemails left after 4:00 p.m. may not be returned until the following business day.  We are closed weekends and major holidays. You have access to a nurse at all times for urgent questions. Please call the main number to the clinic Dept: [phone] and follow the prompts.   For any non-urgent questions, you may also contact your provider using MyChart. We now offer e-Visits for anyone 18 and older to request care online for non-urgent symptoms. For details visit [email or web address].   Also download the MyChart app! Go to the app store, search "MyChart", open the app, select [facility], and log in with your MyChart username and password.  Due to Covid, a mask is required upon entering the hospital/clinic. If you do not have a mask, one will be given to you upon arrival. For doctor visits, patients may have 1 support person aged 18 or older with them. For treatment visits, patients cannot have anyone with them due to current Covid guidelines and our immunocompromised population.   

## 2021-06-05 NOTE — Progress Notes (Signed)
[place] OFFICE PROGRESS NOTE  Patient, No Pcp Per (Inactive) No address on file  DIAGNOSIS: 50 year old man with kidney cancer diagnosed in June 2022.  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  PRIOR THERAPY:  He is status post thoracentesis completed in June 2022 and repeated on January 02, 2021.   He is s/p Pleurx catheter insertion in August 2022.   He is status post stereotactic radiosurgery to the brain completed on January 29, 2021.  He received 20 Gray in 1 fraction.   He is status post kidney biopsy completed on March 06, 2021.  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   Ipilimumab 1 mg/kg with nivolumab 3 mg/kg started on January 15, 2021.  He completed 4 cycles of therapy.    CURRENT THERAPY:  Nivolumab 480 mg every 4 weeks to start on April 10, 2021.  Carbometyx 40 mg p.o. daily starting on 05/11/21.   INTERVAL HISTORY: Dylan BIERNAT 50 y.o. male returns to the clinic today for a follow-up visit.  The patient was last seen in the clinic on 05/08/2021.  At that point in time, the patient had some mild disease progression on repeat imaging from October 2022.  [name] recommended starting carbometyx 40 mg p.o. daily.  The patient's first dose of treatment was on 05/11/21.  He has been tolerating this well. He notes he had mild diarrhea "at first but I'm over it". He states this improved without the use of imodium.   Also at his last appointment, the patient was reporting increased cough but no significant shortness of breath.  His cough is nonproductive.  Denies any hemoptysis.  The patient followed up with his pulmonologist, Dr. Valeta Harms on 05/16/2021.  Due to increased wheezing and shortness of breath, Dr. Valeta Harms started the patient on a Breo treatment.  He also ordered DME supplies for albuterol nebulizers. The patient states his cough has completely resolved at this time. He was supposed to have a repeat brain MRI earlier this  month but cancelled it due to his cough. Since his cough has resolved, he has his brain MRI rescheduled for 06/13/21. He notes he has been having slight increase in headaches recently. He states they are "not severe" and are not daily. He estimates these occur approximately 2x per week without any associated visual changes, balance changes, extremity changes, or seizure activity. He took tylenol today.   The patient's BP, especially his diastolic BP is increased compared to his last few appointments. He does not take anti-hypertensive. He does not have a PCP. He does not have a BP cuff at home. Denies chest discomfort or dizziness. He would be open to me referring him to the internal medicine residency clinic to establish care.   Otherwise, the patient denies any new concerning complaints today.  Denies any changes with any fevers, chills, or night sweats. He lost weight since his last appointment which is surprising to him because he feels like he "eats pretty good". He has boost/ensure at home but has not been using it since he didn't notice any changes in his appetite. Denies any bone pain.  Denies any recent fractures.  Denies any urinary symptoms.  Denies any nausea, vomiting, diarrhea, or constipation.  The abdominal pain.  Denies rashes. He is here today for evaluation and repeat blood work before starting his next cycle of nivolumab.  MEDICAL HISTORY: Past Medical History:  Diagnosis Date   Cancer, metastatic to lung (  [place])    Renal cell adenocarcinoma, left ([place])     ALLERGIES:  has No Known Allergies.  MEDICATIONS:  Current Outpatient Medications  Medication Sig Dispense Refill   amLODipine (NORVASC) 5 MG tablet Take 1 tablet (5 mg total) by mouth daily. 30 tablet 1   acetaminophen (TYLENOL) 325 MG tablet Take 325 mg by mouth every 6 (six) hours as needed (pain).     albuterol (VENTOLIN HFA) 108 (90 Base) MCG/ACT inhaler Inhale 2 puffs into the lungs every 6 (six) hours as needed for  wheezing or shortness of breath. 8 g 6   cabozantinib (CABOMETYX) 40 MG tablet Take 1 tablet (40 mg total) by mouth daily. Take on an empty stomach, 1 hour before or 2 hours after meals. 30 tablet 1   HYDROcodone bit-homatropine (HYCODAN) 5-1.5 MG/5ML syrup Take 5 mLs by mouth every 6 (six) hours as needed for cough. 120 mL 0   Current Facility-Administered Medications  Medication Dose Route Frequency Provider Last Rate Last Admin   fluticasone furoate-vilanterol (BREO ELLIPTA) 200-25 MCG/ACT 1 puff  1 puff Inhalation Daily Icard, Bradley L, DO       Facility-Administered Medications Ordered in Other Visits  Medication Dose Route Frequency Provider Last Rate Last Admin   nivolumab (OPDIVO) 480 mg in sodium chloride 0.9 % 100 mL chemo infusion  480 mg Intravenous Once Shadad, [name], MD        SURGICAL HISTORY:  Past Surgical History:  Procedure Laterality Date   APPENDECTOMY     CHEST TUBE INSERTION Left 02/06/2021   Procedure: INSERTION PLEURAL DRAINAGE CATHETER;  Surgeon: Garner Nash, DO;  Location: [facility];  Service: Pulmonary;  Laterality: Left;   CHEST TUBE INSERTION N/A 04/04/2021   Procedure: removal of pleurex cath;  Surgeon: Garner Nash, DO;  Location: [facility];  Service: Pulmonary;  Laterality: N/A;    REVIEW OF SYSTEMS:   Review of Systems  Constitutional: Positive for weight loss. Negative for appetite change, chills, fatigue, and fever. HENT: Negative for mouth sores, nosebleeds, sore throat and trouble swallowing.   Eyes: Negative for eye problems and icterus.  Respiratory: Negative for cough, hemoptysis, shortness of breath and wheezing.   Cardiovascular: Negative for chest pain and leg swelling.  Gastrointestinal: Negative for abdominal pain, constipation, diarrhea, nausea and vomiting.  Genitourinary: Negative for bladder incontinence, difficulty urinating, dysuria, frequency and hematuria.   Musculoskeletal: Negative for back pain, gait problem, neck  pain and neck stiffness.  Skin: Negative for itching and rash.  Neurological: Positive for headaches. Negative for dizziness, extremity weakness, gait problem, light-headedness and seizures.  Hematological: Negative for adenopathy. Does not bruise/bleed easily.  Psychiatric/Behavioral: Negative for confusion, depression and sleep disturbance. The patient is not nervous/anxious.     PHYSICAL EXAMINATION:  Blood pressure (!) 156/110, pulse 85, temperature 97.7 F (36.5 C), temperature source Tympanic, resp. rate 18, weight 173 lb 12.8 oz (78.8 kg), SpO2 100 %.  ECOG PERFORMANCE STATUS: 1  Physical Exam  Constitutional: Oriented to person, place, and time and well-developed, well-nourished, and in no distress.  HENT:  Head: Normocephalic and atraumatic.  Mouth/Throat: Oropharynx is clear and moist. No oropharyngeal exudate.  Eyes: Conjunctivae are normal. Right eye exhibits no discharge. Left eye exhibits no discharge. No scleral icterus.  Neck: Normal range of motion. Neck supple.  Cardiovascular: Normal rate, regular rhythm, normal heart sounds and intact distal pulses.   Pulmonary/Chest: Effort normal and breath sounds normal. No respiratory distress. No wheezes. No rales.  Abdominal:  Soft. Bowel sounds are normal. Exhibits no distension and no mass. There is no tenderness.  Musculoskeletal: Normal range of motion. Exhibits no edema.  Lymphadenopathy:    No cervical adenopathy.  Neurological: Alert and oriented to person, place, and time. Exhibits normal muscle tone. Gait normal. Coordination normal.  Skin: Skin is warm and dry. No rash noted. Not diaphoretic. No erythema. No pallor.  Psychiatric: Mood, memory and judgment normal.  Vitals reviewed.  LABORATORY DATA: Lab Results  Component Value Date   WBC 4.0 06/05/2021   HGB 13.2 06/05/2021   HCT 43.2 06/05/2021   MCV 71.1 (L) 06/05/2021   PLT 404 (H) 06/05/2021      Chemistry      Component Value Date/Time   NA 135  06/05/2021 1410   K 4.0 06/05/2021 1410   CL 105 06/05/2021 1410   CO2 20 (L) 06/05/2021 1410   BUN 9 06/05/2021 1410   CREATININE 0.63 06/05/2021 1410      Component Value Date/Time   CALCIUM 8.8 (L) 06/05/2021 1410   ALKPHOS 129 (H) 06/05/2021 1410   AST 25 06/05/2021 1410   ALT 40 06/05/2021 1410   BILITOT 0.4 06/05/2021 1410       RADIOGRAPHIC STUDIES:  No results found.   ASSESSMENT/PLAN:  50 year old man with:  1.  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   CT scan obtained on April 16, 2021 was personally reviewed and discussed with the patient at his last appointment with [name] on 05/08/2021.  His disease showed mild progression despite combination immunotherapy.  [name] added Cabometyx to nivolumab.   Patient's first dose was on 05/11/21.  He is tolerating this well, although his BP is elevated today which may be medication related (see number 2 below). The patient took his dose of carbometryx today. Dr. Julien Nordmann recommends not delaying his treatment with nivolumab. Labs were reviewed.  Recommend that he proceed cycle #11 today scheduled as long as his pending CMP is within parameters. Dr. Julien Nordmann did not recommend holding his carbometryx due to BP and recommends anti-hypertension management with his PCP. The patient does not have a PCP but I will refer him to one and start him on anti-hypertensive medication until he can establish care. Will reach out to [name] about today's findings to ensure he does not have any further recommendations.   2. Hypertension: Pt BP 157/104 today. Will recheck in infusion room. I have sent a prescription for norvasc 5 mg p.o. daily to pharmacy until he can establish care with a PCP for hypertension management. I have placed a referral to the internal medicine residency clinic. If his BP continues to be high, I may need to give him clonidine 0.1 mg today in the infusion room. Advised him to pick up a BP cuff  and check his BP daily at home and keep a log of his readings. Dr. Julien Nordmann does not recommend holding his carbometryx at this time. The patient already took his dose today. Will relay to [name] to see if he has any additional recommendations regarding the carbometryx.   BP rechecked in infusion room, BP 143/100. He will proceed with nivo today. Will not give clonidine today since he is driving and may make him drowsy. Advised him to pick up norvasc today and take first dose upon returning home.    3.  Pleural effusion: Resolved at this time and Pleurx catheter removed.     4.  Hypercalcemia: Related to malignancy  and currently on Zometa.  Last infusion was given on 05/08/21  5.  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 The patient has noticed slight increase in headache frequency over the last few weeks. He was supposed to have a MRI a few weeks ago but he cancelled due to cough. He is scheduled for a brain MRI next week. He is aware of this appointment and reiterated to go to his appointment as scheduled.   6.  Cough: Related to his metastatic disease and a pleural effusion.  His pleural effusion has improved. He had a cough at his last visit which has completely resolved at this time.   7.  Goals of care and prognosis: His disease remains incurable although aggressive measures are warranted given his performance status.   8.  Immune mediated complications: Previously been reviewed with the patient by [name] about potential issues including pneumonitis, colitis and thyroid disease.   8.  Follow-up: He will return in 4 weeks for repeat evaluation.   Orders Placed This Encounter  Procedures   Ambulatory referral to Internal Medicine    Referral Priority:   Routine    Referral Type:   Consultation    Referral Reason:   Specialty Services Required    Requested Specialty:   Internal Medicine    Number of Visits Requested:   1      The  total time spent in the appointment was 30-39 minutes.  [place], PA-C 06/05/21

## 2021-06-06 ENCOUNTER — Other Ambulatory Visit (HOSPITAL_COMMUNITY): Payer: Self-pay

## 2021-06-07 ENCOUNTER — Other Ambulatory Visit: Payer: Medicaid Other

## 2021-06-07 ENCOUNTER — Ambulatory Visit: Payer: Medicaid Other | Admitting: Oncology

## 2021-06-13 ENCOUNTER — Other Ambulatory Visit: Payer: Medicaid Other

## 2021-06-13 ENCOUNTER — Ambulatory Visit
Admission: RE | Admit: 2021-06-13 | Discharge: 2021-06-13 | Disposition: A | Discharge status: Home / Self Care | Payer: Medicaid Other | Source: Ambulatory Visit | Attending: Radiation Oncology | Admitting: Radiation Oncology

## 2021-06-13 ENCOUNTER — Other Ambulatory Visit: Payer: Self-pay

## 2021-06-13 DIAGNOSIS — C7949 Secondary malignant neoplasm of other parts of nervous system: Secondary | ICD-10-CM

## 2021-06-13 DIAGNOSIS — C7931 Secondary malignant neoplasm of brain: Secondary | ICD-10-CM

## 2021-06-13 IMAGING — MR MR HEAD WO/W CM
12 series · 48 of 48 positions shown · IV contrast (17 ml multihance)
Comparison: [DATE]

CLINICAL DATA: SRS restaging RCC metastases. Status post radiation
[DATE]

EXAM:
MRI HEAD WITHOUT AND WITH CONTRAST
TECHNIQUE: Multiplanar, multiecho pulse sequences of the brain and surrounding
structures were obtained without and with intravenous contrast.
CONTRAST:  17mL MULTIHANCE GADOBENATE DIMEGLUMINE 529 MG/ML IV SOLN

[Series 2: FLAIR · sagittal · 3.0mm · 0.75mm/px · 1 of 39 slices shown (1 of 2)]
[im 1/39]
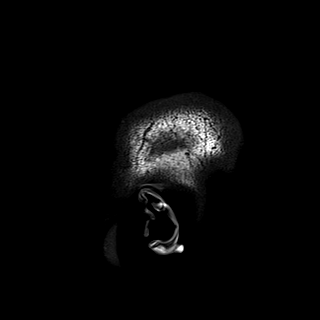

[Series 3: DWI · axial · 3.0mm · 1.50mm/px · z∈[-54,+94]mm · 4 of 78 slices shown (1 of 2)]
[im 1/78]
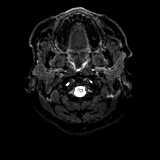
[im 26/78]
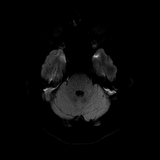
[im 52/78]
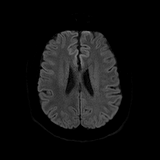
[im 78/78]
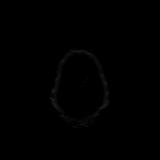

[Series 4: DWI · axial · 3.0mm · 1.50mm/px · z∈[-54,+94]mm · 2 of 36 slices shown (2 of 2)]
[im 1/36]
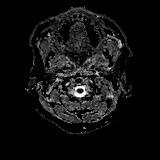
[im 36/36]
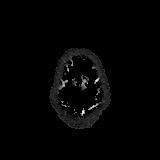

[Series 5: T2 · axial · 5.0mm · 0.57mm/px · 1 of 27 slices shown (1 of 2)]
[im 1/27]
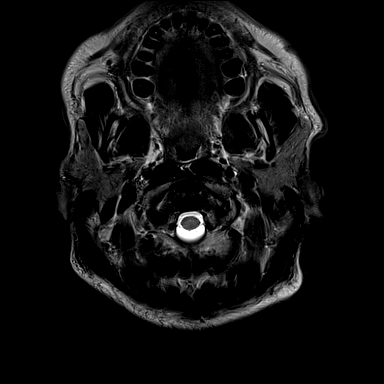

[Series 6: swi_images · axial · 1.5mm · 0.90mm/px · z∈[-48,+94]mm · 5 of 96 slices shown]
[im 1/96]
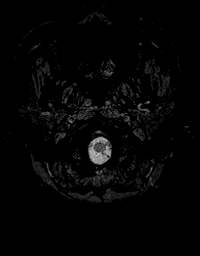
[im 24/96]
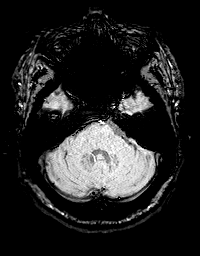
[im 48/96]
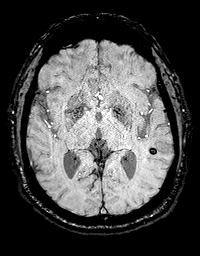
[im 72/96]
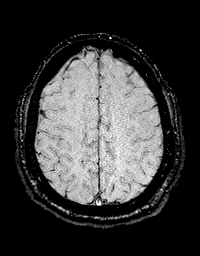
[im 96/96]
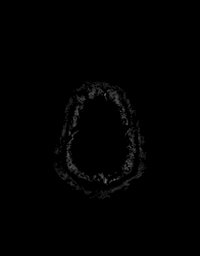

[Series 8: FLAIR · axial · 3.0mm · 0.86mm/px · z∈[-83,+109]mm · 3 of 64 slices shown (2 of 2)]
[im 1/64]
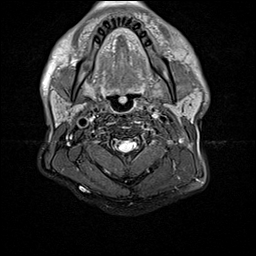
[im 32/64]
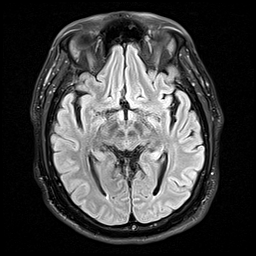
[im 64/64]
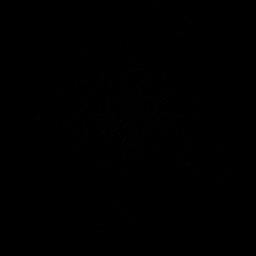

[Series 9: T2 · axial · non-contrast · 1.0mm · 0.86mm/px · z∈[-67,+105]mm · 9 of 176 slices shown (2 of 2)]
[im 1/176]
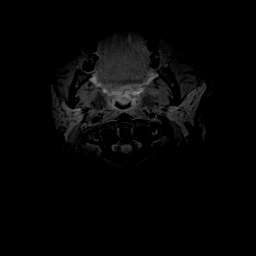
[im 22/176]
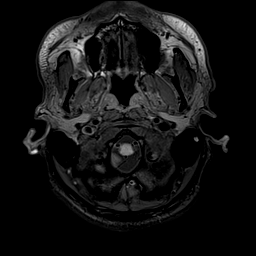
[im 44/176]
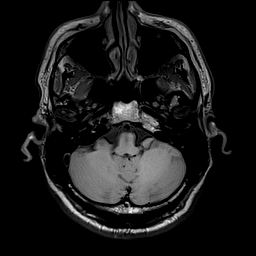
[im 66/176]
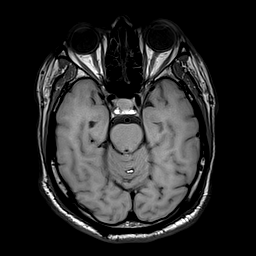
[im 88/176]
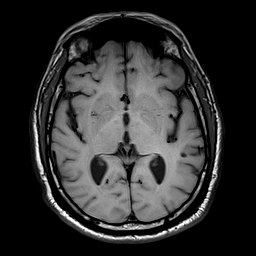
[im 110/176]
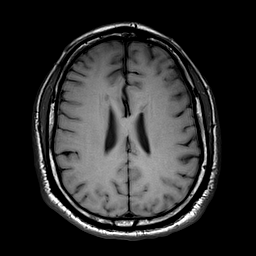
[im 132/176]
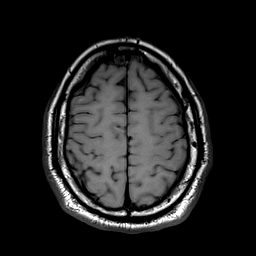
[im 154/176]
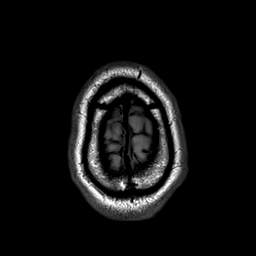
[im 176/176]
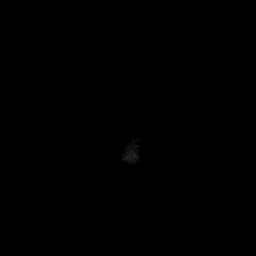

[Series 10: T2 post-contrast · coronal · 3.0mm · 0.57mm/px · 2 of 47 slices shown (1 of 2)]
[im 1/47]
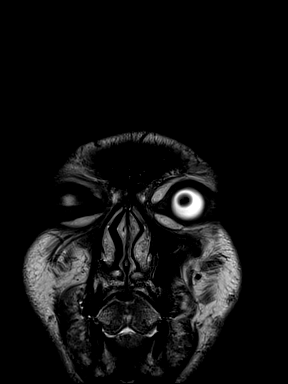
[im 47/47]
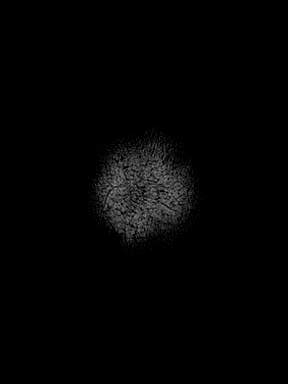

[Series 11: T2 post-contrast · axial · 1.0mm · 0.86mm/px · z∈[-67,+105]mm · 9 of 176 slices shown (2 of 2)]
[im 1/176]
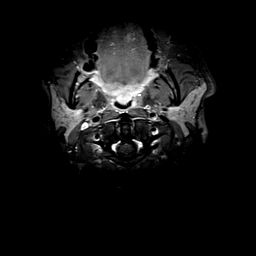
[im 22/176]
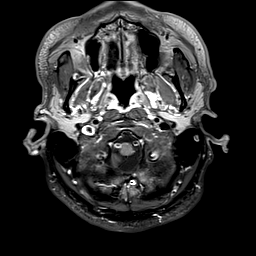
[im 44/176]
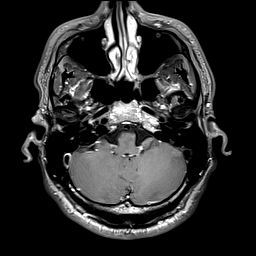
[im 66/176]
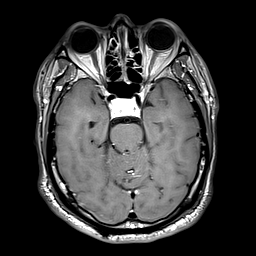
[im 88/176]
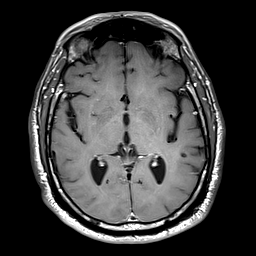
[im 110/176]
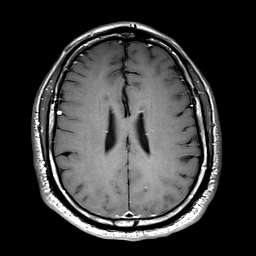
[im 132/176]
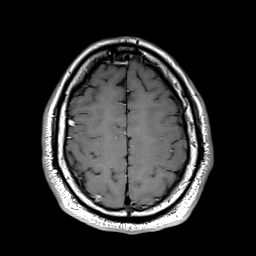
[im 154/176]
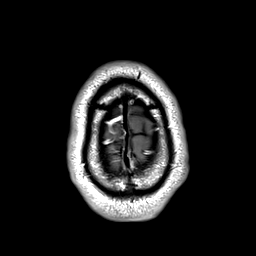
[im 176/176]
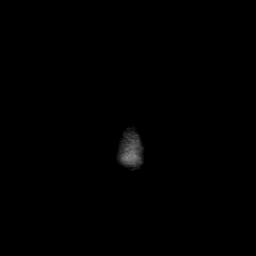

[Series 12: T1 post-contrast · axial · 1.0mm · 0.75mm/px · z∈[-61,+98]mm · 8 of 160 slices shown (1 of 2)]
[im 1/160]
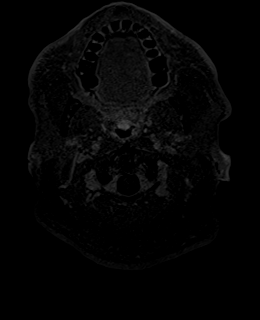
[im 23/160]
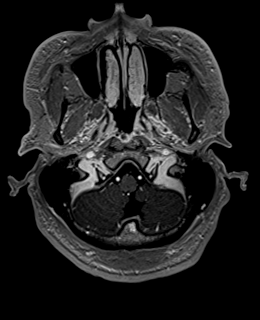
[im 46/160]
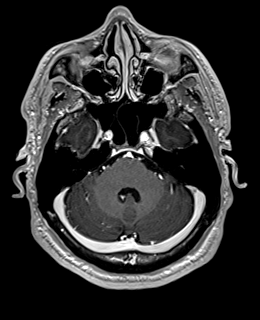
[im 69/160]
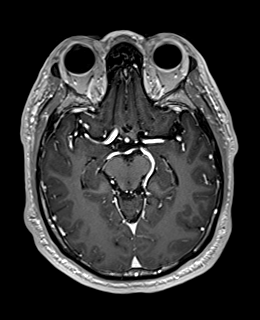
[im 91/160]
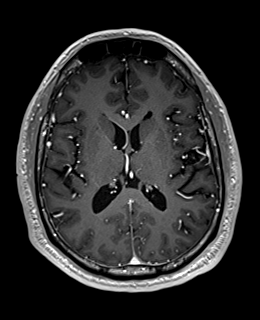
[im 114/160]
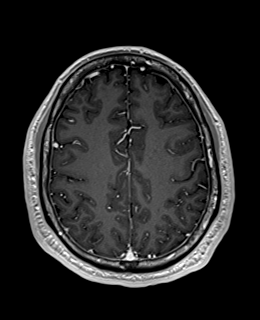
[im 137/160]
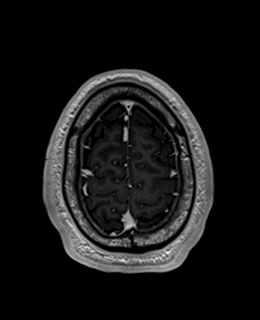
[im 160/160]
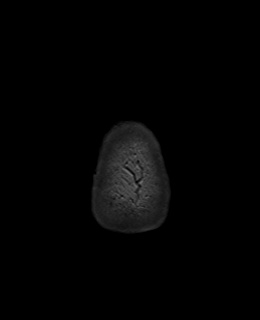

[Series 13: T1 post-contrast · coronal · 3.0mm · 0.57mm/px · 2 of 47 slices shown (2 of 2)]
[im 1/47]
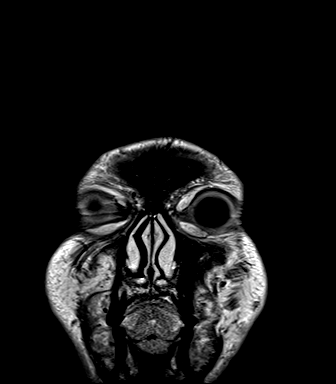
[im 47/47]
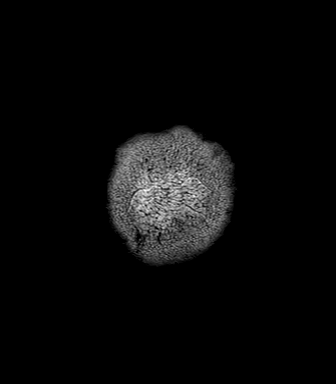

[Series 14: FLAIR post-contrast · sagittal · 3.0mm · 0.75mm/px · 2 of 39 slices shown]
[im 1/39]
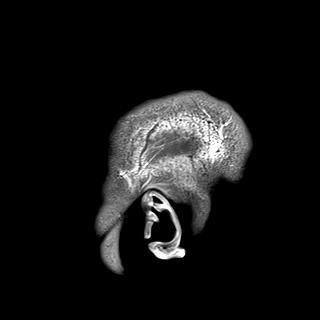
[im 39/39]
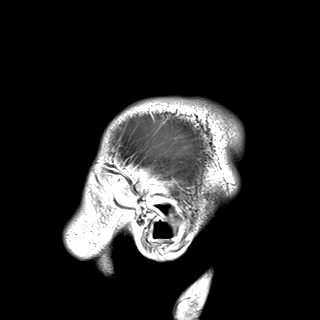

[48 of 48 positions shown; findings below may reference images not displayed]

FINDINGS: Brain: No restricted diffusion to suggest acute or subacute infarct.
Previously noted enhancing lesion in the left temporal lobe is now
T1 hypointense and now no longer enhances (series 12, image 83),
with minimal adjacent enhancement (series 12, image 81), which may
be post treatment related, and resolution of previously noted
adjacent edema. Additional previously noted punctate focus of
enhancement in the medial right temporal lobe no longer definitively
enhances (series 12, image 70).

Small T1 hyperintense focus in the medial left cerebellar hemisphere
(series 12, image 61), measuring 5 x 2 mm (series 9, image 65). An
adjacent developmental venous anomaly was present on the prior exam,
but the small T1 hyperintense was not. The focus is associated with
susceptibility (series 6, image 35).

No acute hemorrhage, mass, mass effect, or midline shift. No
extra-axial collection or hydrocephalus. T2 hyperintense signal in
the periventricular white matter, likely the sequela of chronic
small vessel ischemic disease.

Vascular: Normal flow voids.

Skull and upper cervical spine: Normal marrow signal.

Sinuses/Orbits: Negative.

Other: The mastoids are well aerated.
IMPRESSION: 1. Previously noted enhancing lesion in the left temporal lobe no
longer enhances, with minimal adjacent enhancement, likely post
treatment. Additional punctate focus of enhancement in the medial
right temporal lobe is also no longer seen.
2. T1 hyperintense focus with associated susceptibility in the
medial left cerebellum, which is suspicious for metastasis given
development since [REDACTED], however this appearance could reflect
hemorrhage into a cavernoma given adjacent DVA.

## 2021-06-13 MED ORDER — GADOBENATE DIMEGLUMINE 529 MG/ML IV SOLN
17.0000 mL | Freq: Once | INTRAVENOUS | Status: AC | PRN
  Administered 2021-06-13: 17 mL via INTRAVENOUS

## 2021-06-18 ENCOUNTER — Other Ambulatory Visit: Payer: Self-pay

## 2021-06-18 ENCOUNTER — Encounter (HOSPITAL_COMMUNITY): Payer: Self-pay

## 2021-06-18 ENCOUNTER — Other Ambulatory Visit: Payer: Self-pay | Admitting: Radiation Therapy

## 2021-06-18 ENCOUNTER — Inpatient Hospital Stay (HOSPITAL_COMMUNITY)
Admission: EM | Admit: 2021-06-18 | Discharge: 2021-06-21 | DRG: 439 | Disposition: A | Discharge status: Home / Self Care | Payer: Medicaid Other | Attending: Internal Medicine | Admitting: Internal Medicine

## 2021-06-18 DIAGNOSIS — K853 Drug induced acute pancreatitis without necrosis or infection: Principal | ICD-10-CM | POA: Diagnosis present

## 2021-06-18 DIAGNOSIS — F17210 Nicotine dependence, cigarettes, uncomplicated: Secondary | ICD-10-CM | POA: Diagnosis present

## 2021-06-18 DIAGNOSIS — M461 Sacroiliitis, not elsewhere classified: Secondary | ICD-10-CM | POA: Diagnosis present

## 2021-06-18 DIAGNOSIS — I10 Essential (primary) hypertension: Secondary | ICD-10-CM | POA: Diagnosis present

## 2021-06-18 DIAGNOSIS — K5900 Constipation, unspecified: Secondary | ICD-10-CM | POA: Diagnosis present

## 2021-06-18 DIAGNOSIS — C7801 Secondary malignant neoplasm of right lung: Secondary | ICD-10-CM | POA: Diagnosis present

## 2021-06-18 DIAGNOSIS — C7931 Secondary malignant neoplasm of brain: Secondary | ICD-10-CM

## 2021-06-18 DIAGNOSIS — R52 Pain, unspecified: Secondary | ICD-10-CM

## 2021-06-18 DIAGNOSIS — T451X5A Adverse effect of antineoplastic and immunosuppressive drugs, initial encounter: Secondary | ICD-10-CM | POA: Diagnosis present

## 2021-06-18 DIAGNOSIS — Z20822 Contact with and (suspected) exposure to covid-19: Secondary | ICD-10-CM | POA: Diagnosis present

## 2021-06-18 DIAGNOSIS — E872 Acidosis, unspecified: Secondary | ICD-10-CM | POA: Diagnosis present

## 2021-06-18 DIAGNOSIS — Z9049 Acquired absence of other specified parts of digestive tract: Secondary | ICD-10-CM

## 2021-06-18 DIAGNOSIS — N1330 Unspecified hydronephrosis: Secondary | ICD-10-CM | POA: Diagnosis present

## 2021-06-18 DIAGNOSIS — Z9221 Personal history of antineoplastic chemotherapy: Secondary | ICD-10-CM

## 2021-06-18 DIAGNOSIS — K859 Acute pancreatitis without necrosis or infection, unspecified: Secondary | ICD-10-CM | POA: Diagnosis present

## 2021-06-18 DIAGNOSIS — R062 Wheezing: Secondary | ICD-10-CM

## 2021-06-18 DIAGNOSIS — C642 Malignant neoplasm of left kidney, except renal pelvis: Secondary | ICD-10-CM | POA: Diagnosis present

## 2021-06-18 DIAGNOSIS — Z79899 Other long term (current) drug therapy: Secondary | ICD-10-CM

## 2021-06-18 DIAGNOSIS — M160 Bilateral primary osteoarthritis of hip: Secondary | ICD-10-CM | POA: Diagnosis present

## 2021-06-18 DIAGNOSIS — D72818 Other decreased white blood cell count: Secondary | ICD-10-CM | POA: Diagnosis present

## 2021-06-18 DIAGNOSIS — C7802 Secondary malignant neoplasm of left lung: Secondary | ICD-10-CM | POA: Diagnosis present

## 2021-06-18 LAB — CBC WITH DIFFERENTIAL/PLATELET
Abs Immature Granulocytes: 0.01 10*3/uL (ref 0.00–0.07)
Basophils Absolute: 0.1 10*3/uL (ref 0.0–0.1)
Basophils Relative: 1 %
Eosinophils Absolute: 0.2 10*3/uL (ref 0.0–0.5)
Eosinophils Relative: 4 %
HCT: 50.8 % (ref 39.0–52.0)
Hemoglobin: 15.8 g/dL (ref 13.0–17.0)
Immature Granulocytes: 0 %
Lymphocytes Relative: 32 %
Lymphs Abs: 1.4 10*3/uL (ref 0.7–4.0)
MCH: 22.3 pg — ABNORMAL LOW (ref 26.0–34.0)
MCHC: 31.1 g/dL (ref 30.0–36.0)
MCV: 71.5 fL — ABNORMAL LOW (ref 80.0–100.0)
Monocytes Absolute: 0.3 10*3/uL (ref 0.1–1.0)
Monocytes Relative: 7 %
Neutro Abs: 2.4 10*3/uL (ref 1.7–7.7)
Neutrophils Relative %: 56 %
Platelets: 257 10*3/uL (ref 150–400)
RBC: 7.1 MIL/uL — ABNORMAL HIGH (ref 4.22–5.81)
RDW: 22.2 % — ABNORMAL HIGH (ref 11.5–15.5)
WBC: 4.3 10*3/uL (ref 4.0–10.5)
nRBC: 0 % (ref 0.0–0.2)

## 2021-06-18 LAB — COMPREHENSIVE METABOLIC PANEL
ALT: 28 U/L (ref 0–44)
AST: 24 U/L (ref 15–41)
Albumin: 3.9 g/dL (ref 3.5–5.0)
Alkaline Phosphatase: 110 U/L (ref 38–126)
Anion gap: 8 (ref 5–15)
BUN: 11 mg/dL (ref 6–20)
CO2: 24 mmol/L (ref 22–32)
Calcium: 9.7 mg/dL (ref 8.9–10.3)
Chloride: 102 mmol/L (ref 98–111)
Creatinine, Ser: 0.59 mg/dL — ABNORMAL LOW (ref 0.61–1.24)
GFR, Estimated: >60 mL/min (ref >60)
Glucose, Bld: 103 mg/dL — ABNORMAL HIGH (ref 70–99)
Potassium: 4.2 mmol/L (ref 3.5–5.1)
Sodium: 134 mmol/L — ABNORMAL LOW (ref 135–145)
Total Bilirubin: 0.7 mg/dL (ref 0.3–1.2)
Total Protein: 8.9 g/dL — ABNORMAL HIGH (ref 6.5–8.1)

## 2021-06-18 LAB — LIPASE, BLOOD: Lipase: 297 U/L — ABNORMAL HIGH (ref 11–51)

## 2021-06-18 NOTE — ED Triage Notes (Signed)
Pt reports with abdominal pain x 1 week. Pt states that he started taking a new medication for his kidney cancer a month ago.

## 2021-06-19 ENCOUNTER — Encounter (HOSPITAL_COMMUNITY): Payer: Self-pay

## 2021-06-19 ENCOUNTER — Emergency Department (HOSPITAL_COMMUNITY): Payer: Medicaid Other

## 2021-06-19 ENCOUNTER — Telehealth: Payer: Self-pay | Admitting: Radiation Therapy

## 2021-06-19 DIAGNOSIS — M160 Bilateral primary osteoarthritis of hip: Secondary | ICD-10-CM | POA: Diagnosis present

## 2021-06-19 DIAGNOSIS — D72819 Decreased white blood cell count, unspecified: Secondary | ICD-10-CM | POA: Diagnosis not present

## 2021-06-19 DIAGNOSIS — Z9049 Acquired absence of other specified parts of digestive tract: Secondary | ICD-10-CM | POA: Diagnosis not present

## 2021-06-19 DIAGNOSIS — T451X5A Adverse effect of antineoplastic and immunosuppressive drugs, initial encounter: Secondary | ICD-10-CM | POA: Diagnosis present

## 2021-06-19 DIAGNOSIS — K859 Acute pancreatitis without necrosis or infection, unspecified: Secondary | ICD-10-CM | POA: Diagnosis not present

## 2021-06-19 DIAGNOSIS — D72818 Other decreased white blood cell count: Secondary | ICD-10-CM | POA: Diagnosis present

## 2021-06-19 DIAGNOSIS — Z20822 Contact with and (suspected) exposure to covid-19: Secondary | ICD-10-CM | POA: Diagnosis present

## 2021-06-19 DIAGNOSIS — N1330 Unspecified hydronephrosis: Secondary | ICD-10-CM | POA: Diagnosis present

## 2021-06-19 DIAGNOSIS — E872 Acidosis, unspecified: Secondary | ICD-10-CM | POA: Diagnosis present

## 2021-06-19 DIAGNOSIS — Z79899 Other long term (current) drug therapy: Secondary | ICD-10-CM | POA: Diagnosis not present

## 2021-06-19 DIAGNOSIS — F17210 Nicotine dependence, cigarettes, uncomplicated: Secondary | ICD-10-CM | POA: Diagnosis present

## 2021-06-19 DIAGNOSIS — C641 Malignant neoplasm of right kidney, except renal pelvis: Secondary | ICD-10-CM | POA: Diagnosis not present

## 2021-06-19 DIAGNOSIS — Z9221 Personal history of antineoplastic chemotherapy: Secondary | ICD-10-CM | POA: Diagnosis not present

## 2021-06-19 DIAGNOSIS — R1013 Epigastric pain: Secondary | ICD-10-CM | POA: Diagnosis not present

## 2021-06-19 DIAGNOSIS — R109 Unspecified abdominal pain: Secondary | ICD-10-CM | POA: Diagnosis not present

## 2021-06-19 DIAGNOSIS — C7801 Secondary malignant neoplasm of right lung: Secondary | ICD-10-CM | POA: Diagnosis present

## 2021-06-19 DIAGNOSIS — I10 Essential (primary) hypertension: Secondary | ICD-10-CM | POA: Diagnosis present

## 2021-06-19 DIAGNOSIS — C7931 Secondary malignant neoplasm of brain: Secondary | ICD-10-CM | POA: Diagnosis present

## 2021-06-19 DIAGNOSIS — M461 Sacroiliitis, not elsewhere classified: Secondary | ICD-10-CM | POA: Diagnosis present

## 2021-06-19 DIAGNOSIS — M25551 Pain in right hip: Secondary | ICD-10-CM | POA: Diagnosis not present

## 2021-06-19 DIAGNOSIS — K853 Drug induced acute pancreatitis without necrosis or infection: Secondary | ICD-10-CM | POA: Diagnosis present

## 2021-06-19 DIAGNOSIS — C7802 Secondary malignant neoplasm of left lung: Secondary | ICD-10-CM | POA: Diagnosis present

## 2021-06-19 DIAGNOSIS — K5900 Constipation, unspecified: Secondary | ICD-10-CM | POA: Diagnosis present

## 2021-06-19 DIAGNOSIS — C642 Malignant neoplasm of left kidney, except renal pelvis: Secondary | ICD-10-CM | POA: Diagnosis present

## 2021-06-19 LAB — URINALYSIS, ROUTINE W REFLEX MICROSCOPIC
Bacteria, UA: NONE SEEN
Bilirubin Urine: NEGATIVE
Glucose, UA: NEGATIVE mg/dL
Ketones, ur: 5 mg/dL — AB
Nitrite: NEGATIVE
Protein, ur: 30 mg/dL — AB
Specific Gravity, Urine: 1.02 (ref 1.005–1.030)
pH: 5 (ref 5.0–8.0)

## 2021-06-19 LAB — RESP PANEL BY RT-PCR (FLU A&B, COVID) ARPGX2
Influenza A by PCR: NEGATIVE
Influenza B by PCR: NEGATIVE
SARS Coronavirus 2 by RT PCR: NEGATIVE

## 2021-06-19 LAB — HIV ANTIBODY (ROUTINE TESTING W REFLEX): HIV Screen 4th Generation wRfx: NONREACTIVE

## 2021-06-19 IMAGING — CT CT ABD-PELV W/ CM
2 of 5 series · 14 of 46 positions shown, 16 images · IV contrast (omnipaque)
Comparison: CT [DATE]

CLINICAL DATA: Abdominal pain, acute, nonlocalized. History of
metastatic renal cell carcinoma on chemotherapy

EXAM:
CT ABDOMEN AND PELVIS WITH CONTRAST
TECHNIQUE: Multidetector CT imaging of the abdomen and pelvis was performed
using the standard protocol following bolus administration of
intravenous contrast.
CONTRAST:  80mL OMNIPAQUE IOHEXOL 350 MG/ML SOLN

[Series 2: axial st · axial · 0.73mm/px · z∈[+1079,+1494]mm · 11 of 97 slices shown, 13 images]
[im 7/97  soft-tissue]
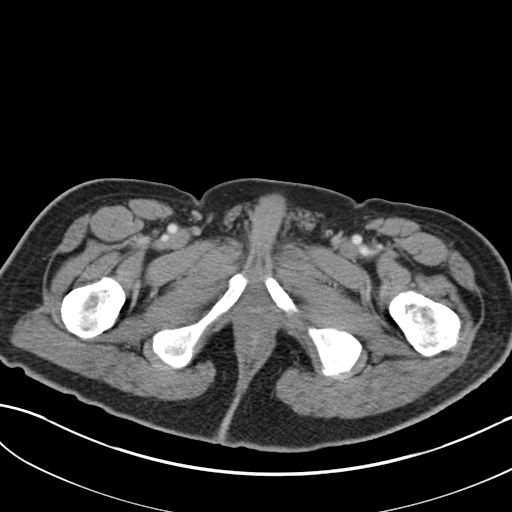
[im 7/97  bone]
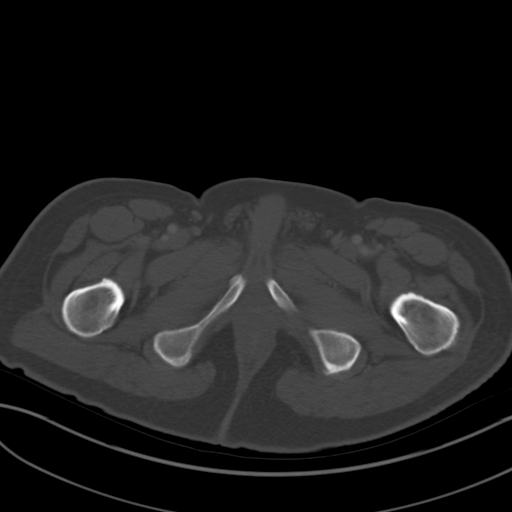
[im 13/97  soft-tissue]
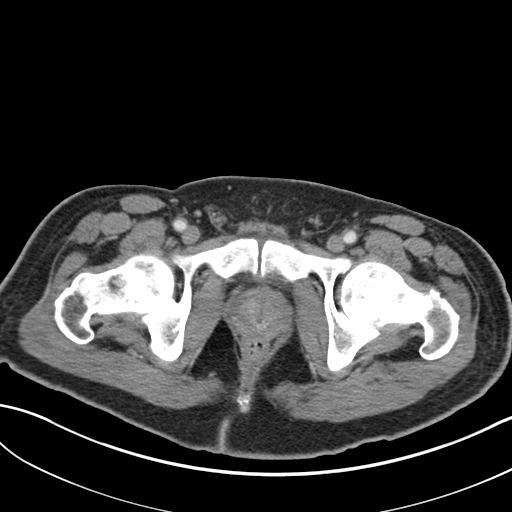
[im 26/97  soft-tissue]
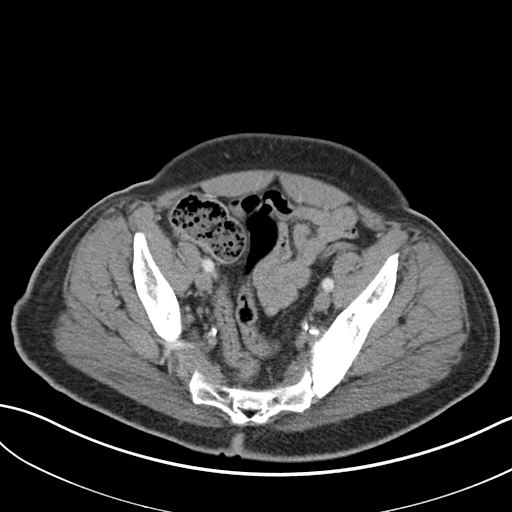
[im 33/97  soft-tissue]
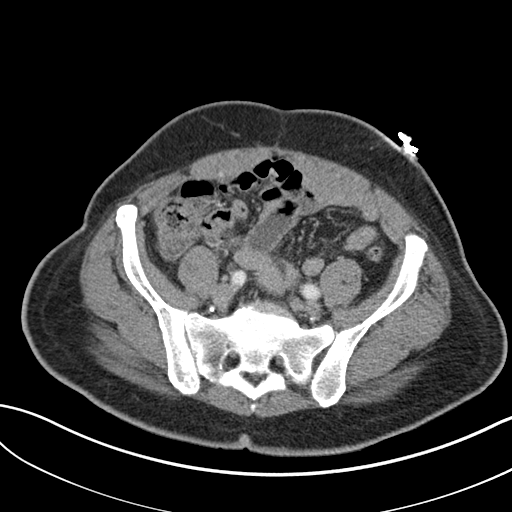
[im 39/97  soft-tissue]
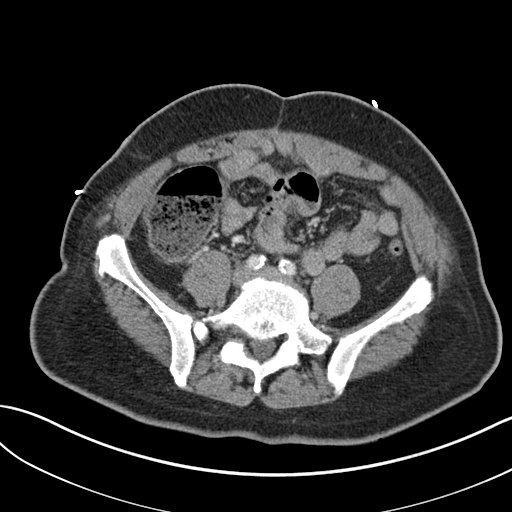
[im 52/97  soft-tissue]
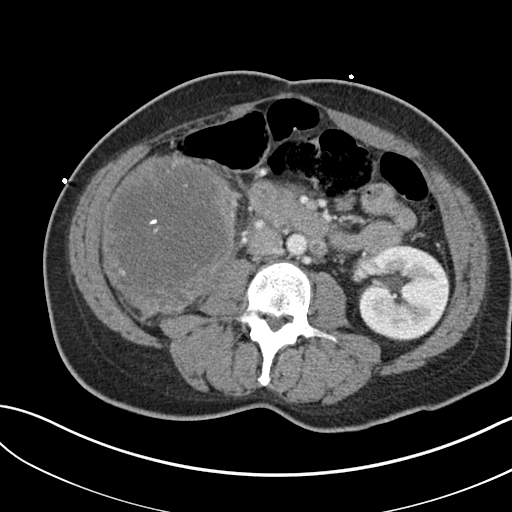
[im 58/97  soft-tissue]
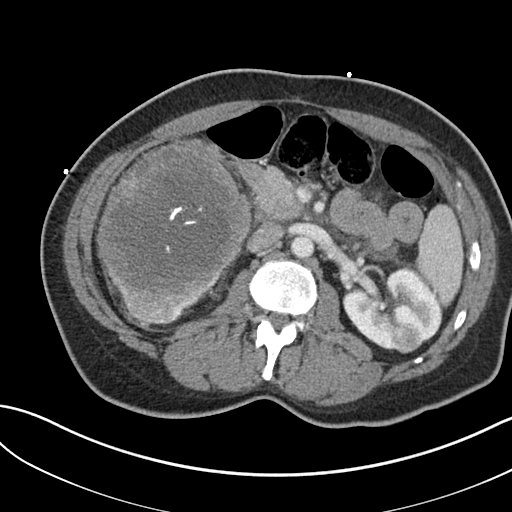
[im 65/97  soft-tissue]
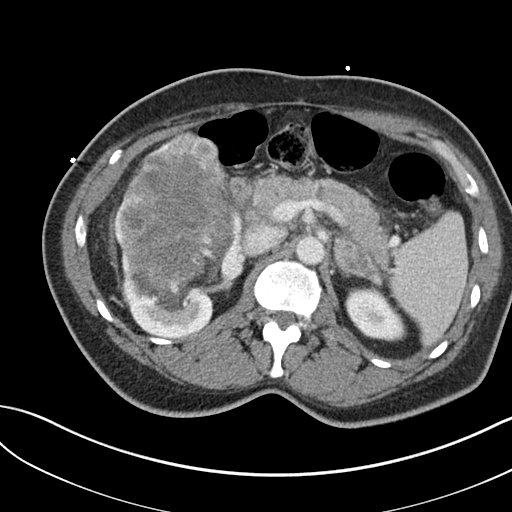
[im 71/97  soft-tissue]
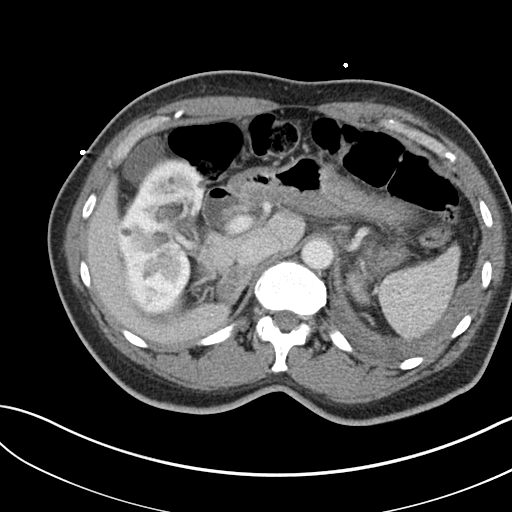
[im 71/97  bone]
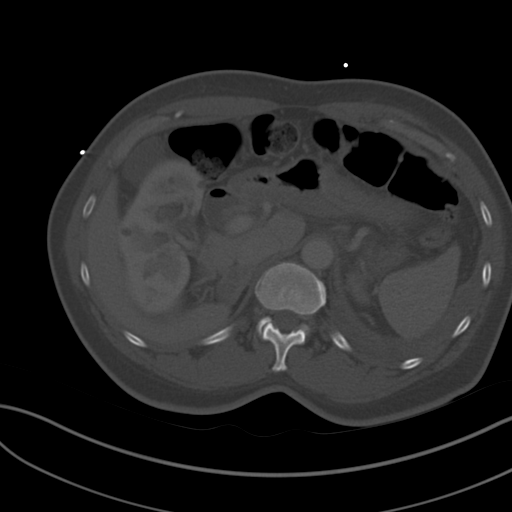
[im 84/97  soft-tissue]
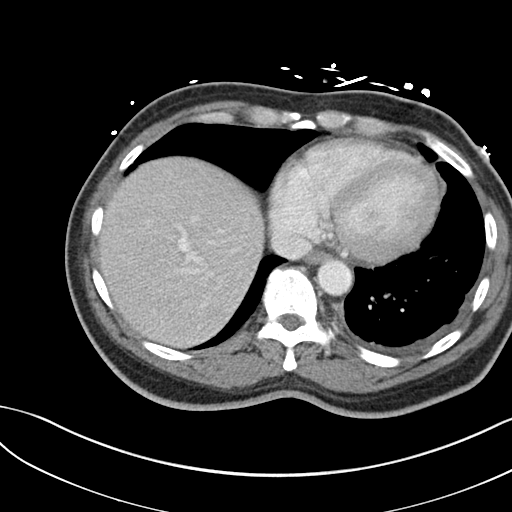
[im 90/97  soft-tissue]
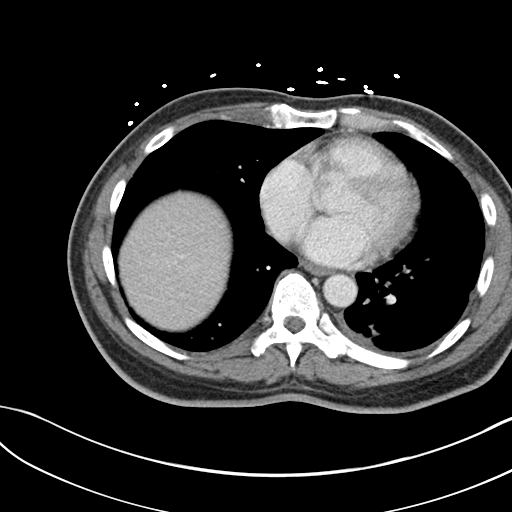

[Series 4: coronal st · coronal · 0.69mm/px · 3 of 142 slices shown]
[im 48/142  soft-tissue]
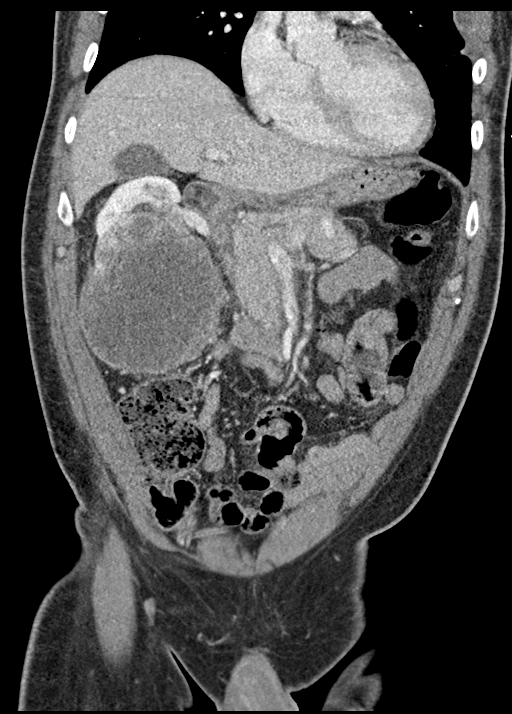
[im 63/142  soft-tissue]
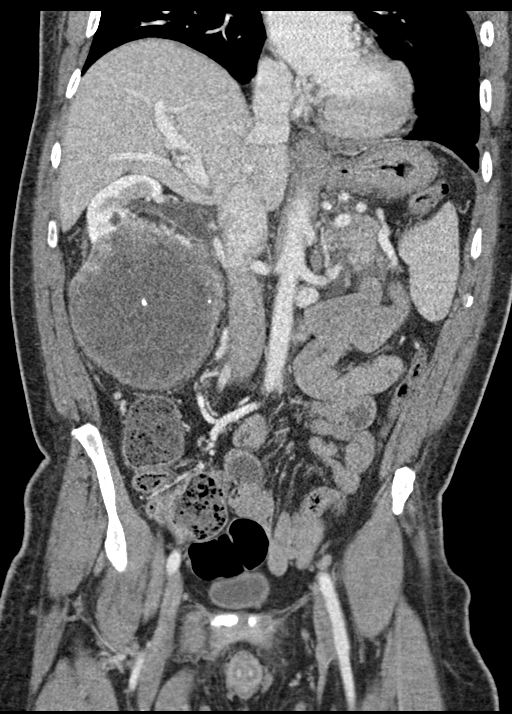
[im 79/142  soft-tissue]
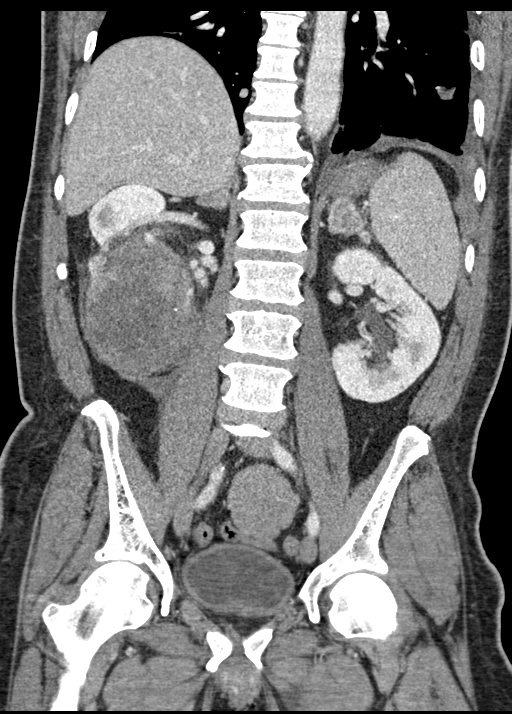

[14 of 46 positions shown; findings below may reference images not displayed]

FINDINGS: Lower chest: Multiple bilateral pulmonary nodules within the
included lung bases, overall decreased in size and number compared
to the previous CT of [DATE]. For reference, medial left lower
lobe nodule measures 2.0 x 1.3 cm (series 3, image 42) previously
3.7 x 3.6 cm). Nodule within the medial right lung base inferiorly
measures 0.8 cm (series 3, image 29), previously 1.4 cm. Trace
left-sided pleural effusion, slightly decreased. Previously seen
pleural based masses along the left lower lobe have also
significantly decreased in size from prior. Partially visualized
expansile mass of the lateral aspect of the left chest wall with
destructive changes of the left fifth rib, incompletely
characterized at the edge of the field of view. Heart size is
normal.

Hepatobiliary: No focal liver abnormality is seen. No gallstones,
gallbladder wall thickening, or biliary dilatation.

Pancreas: Unremarkable. No pancreatic ductal dilatation or
surrounding inflammatory changes.

Spleen: Normal in size without focal abnormality.

Adrenals/Urinary Tract: Left adrenal mass measures approximately
x 2.1 cm (previously 6.4 x 2.9 cm). Right adrenal mass measures
x 1.6 cm (previously 3.5 x 2.1 cm).

Large heterogeneous centrally necrotic mass with multiple
calcifications arising from the right kidney measuring approximately
13.0 x 10.4 cm trans axially (previously 15 x 13.5 cm). Mild right
hydronephrosis.

Solid left renal mass measures 2.3 x 2.2 cm trans axially
(previously 2.9 x 2.9 cm). No left-sided hydronephrosis.

Urinary bladder is within normal limits.

Stomach/Bowel: Stomach is within normal limits. Appendix is
surgically absent. No evidence of bowel wall thickening, distention,
or inflammatory changes.

Vascular/Lymphatic: Interval decrease in size and number of
retroperitoneal lymph nodes. Reference nodes includes 1.0 cm left
para-aortic node (series 2, image 46). Scattered atherosclerotic
calcifications throughout the aortoiliac axis without aneurysm.

Reproductive: Prostate is unremarkable.

Other: No ascites.  No abscess.  No free air.

Musculoskeletal: Destructive lesion centered at the left fifth rib,
as above. Chronic changes of bilateral sacroiliitis. No new
suspicious bone lesion.
IMPRESSION: 1. Findings compatible with favorable response to treatment with
interval decrease in size of bilateral renal masses, bilateral
adrenal gland masses, and decreasing retroperitoneal adenopathy.
There is also a significant interval decrease in size and number of
bilateral pulmonary nodules and left sided pleural based masses
within the visualized lung bases.
2. Partially visualized expansile mass of the lateral aspect of the
left chest wall with destructive changes of the left fifth rib,
incompletely characterized at the edge of the field of view.
3. Mild right hydronephrosis.
4. Aortic atherosclerosis ([8J]-[8J]).

## 2021-06-19 MED ORDER — IOHEXOL 350 MG/ML SOLN
80.0000 mL | Freq: Once | INTRAVENOUS | Status: AC | PRN
  Administered 2021-06-19: 80 mL via INTRAVENOUS

## 2021-06-19 MED ORDER — SODIUM CHLORIDE 0.9 % IV BOLUS
1000.0000 mL | Freq: Once | INTRAVENOUS | Status: AC
  Administered 2021-06-19: 1000 mL via INTRAVENOUS

## 2021-06-19 MED ORDER — FLUTICASONE FUROATE-VILANTEROL 200-25 MCG/ACT IN AEPB
1.0000 | INHALATION_SPRAY | Freq: Every day | RESPIRATORY_TRACT | Status: DC
  Filled 2021-06-19: qty 28

## 2021-06-19 MED ORDER — ONDANSETRON HCL 4 MG/2ML IJ SOLN
4.0000 mg | Freq: Once | INTRAMUSCULAR | Status: AC
  Administered 2021-06-19: 4 mg via INTRAVENOUS
  Filled 2021-06-19: qty 2

## 2021-06-19 MED ORDER — PANTOPRAZOLE SODIUM 40 MG IV SOLR
40.0000 mg | INTRAVENOUS | Status: DC
  Administered 2021-06-19 – 2021-06-21 (×3): 40 mg via INTRAVENOUS
  Filled 2021-06-19 (×3): qty 40

## 2021-06-19 MED ORDER — AMLODIPINE BESYLATE 5 MG PO TABS
5.0000 mg | ORAL_TABLET | Freq: Every day | ORAL | Status: DC
  Administered 2021-06-19 – 2021-06-21 (×3): 5 mg via ORAL
  Filled 2021-06-19 (×3): qty 1

## 2021-06-19 MED ORDER — HYDROMORPHONE HCL 1 MG/ML IJ SOLN
1.0000 mg | Freq: Once | INTRAMUSCULAR | Status: AC
  Administered 2021-06-19: 1 mg via INTRAVENOUS
  Filled 2021-06-19: qty 1

## 2021-06-19 MED ORDER — ACETAMINOPHEN 650 MG RE SUPP: 650.0000 mg | Freq: Four times a day (QID) | RECTAL | Status: DC | PRN

## 2021-06-19 MED ORDER — HYDROMORPHONE HCL 1 MG/ML IJ SOLN
1.0000 mg | INTRAMUSCULAR | Status: DC | PRN
  Administered 2021-06-19 – 2021-06-21 (×9): 1 mg via INTRAVENOUS
  Filled 2021-06-19 (×10): qty 1

## 2021-06-19 MED ORDER — ONDANSETRON HCL 4 MG PO TABS: 4.0000 mg | ORAL_TABLET | Freq: Four times a day (QID) | ORAL | Status: DC | PRN

## 2021-06-19 MED ORDER — ONDANSETRON HCL 4 MG/2ML IJ SOLN: 4.0000 mg | Freq: Four times a day (QID) | INTRAMUSCULAR | Status: DC | PRN

## 2021-06-19 MED ORDER — HYDRALAZINE HCL 20 MG/ML IJ SOLN: 10.0000 mg | Freq: Three times a day (TID) | INTRAMUSCULAR | Status: DC | PRN

## 2021-06-19 MED ORDER — SODIUM CHLORIDE 0.9 % IV SOLN
INTRAVENOUS | Status: DC
  Administered 2021-06-19: 100 mL via INTRAVENOUS

## 2021-06-19 MED ORDER — ACETAMINOPHEN 325 MG PO TABS: 650.0000 mg | ORAL_TABLET | Freq: Four times a day (QID) | ORAL | Status: DC | PRN

## 2021-06-19 MED ORDER — OXYCODONE HCL 5 MG PO TABS
5.0000 mg | ORAL_TABLET | ORAL | Status: DC | PRN
  Administered 2021-06-19 – 2021-06-21 (×2): 5 mg via ORAL
  Filled 2021-06-19 (×2): qty 1

## 2021-06-19 MED ORDER — DOCUSATE SODIUM 100 MG PO CAPS
100.0000 mg | ORAL_CAPSULE | Freq: Two times a day (BID) | ORAL | Status: DC
  Administered 2021-06-19 – 2021-06-21 (×4): 100 mg via ORAL
  Filled 2021-06-19 (×5): qty 1

## 2021-06-19 NOTE — ED Provider Notes (Signed)
[facility] DEPT Provider Note   CSN: [id number] Arrival date & time: 06/18/21  1937     History Chief Complaint  Patient presents with   Abdominal Pain    Dylan Good is a 50 y.o. male.  Patient presents to the emergency department for evaluation of abdominal pain.  Patient complaining of diffuse abdominal pain with focality to the left upper abdomen.  Pain has been present for approximately a week, significantly worsened overnight.  No associated fever, nausea, vomiting or diarrhea.  Patient reports that he was just started on a new drug for his kidney cancer.      Past Medical History:  Diagnosis Date   Cancer, metastatic to lung [facility])    Renal cell adenocarcinoma, left [facility])     Patient Active Problem List   Diagnosis Date Noted   Chest tube in place    Pleural effusion on left 01/31/2021   Brain metastases ([place]) 01/24/2021   Hypercalcemia 01/15/2021   Kidney cancer, primary, with metastasis from kidney to other site [facility]) 12/29/2020    Past Surgical History:  Procedure Laterality Date   APPENDECTOMY     CHEST TUBE INSERTION Left 02/06/2021   Procedure: INSERTION PLEURAL DRAINAGE CATHETER;  Surgeon: Garner Nash, DO;  Location: [place];  Service: Pulmonary;  Laterality: Left;   CHEST TUBE INSERTION N/A 04/04/2021   Procedure: removal of pleurex cath;  Surgeon: Garner Nash, DO;  Location: [place];  Service: Pulmonary;  Laterality: N/A;       History reviewed. No pertinent family history.  Social History   Tobacco Use   Smoking status: Some Days    Packs/day: 0.50    Types: Cigarettes   Smokeless tobacco: Never  Vaping Use   Vaping Use: Never used  Substance Use Topics   Alcohol use: Never   Drug use: Never    Home Medications Prior to Admission medications   Medication Sig Start Date End Date Taking? Authorizing Provider  acetaminophen (TYLENOL) 500 MG tablet Take 1,000 mg by mouth every 6 (six)  hours as needed (pain).   Yes [provider]  amLODipine (NORVASC) 5 MG tablet Take 1 tablet (5 mg total) by mouth daily. 06/05/21  Yes Heilingoetter, Cassandra L, PA-C  cabozantinib (CABOMETYX) 40 MG tablet Take 1 tablet (40 mg total) by mouth daily. Take on an empty stomach, 1 hour before or 2 hours after meals. 05/08/21  Yes [name], MD    Allergies    Patient has no known allergies.  Review of Systems   Review of Systems  Gastrointestinal:  Positive for abdominal pain.  All other systems reviewed and are negative.  Physical Exam Updated Vital Signs BP (!) 156/100   Pulse 64   Temp 97.9 F (36.6 C) (Oral)   Resp 16   Ht 5\' 9"  (1.753 m)   Wt 79.4 kg   SpO2 97%   BMI 25.84 kg/m   Physical Exam Vitals and nursing note reviewed.  Constitutional:      General: He is not in acute distress.    Appearance: Normal appearance. He is well-developed.  HENT:     Head: Normocephalic and atraumatic.     Right Ear: Hearing normal.     Left Ear: Hearing normal.     Nose: Nose normal.  Eyes:     Conjunctiva/sclera: Conjunctivae normal.     Pupils: Pupils are equal, round, and reactive to light.  Cardiovascular:     Rate and  Rhythm: Regular rhythm.     Heart sounds: S1 normal and S2 normal. No murmur heard.   No friction rub. No gallop.  Pulmonary:     Effort: Pulmonary effort is normal. No respiratory distress.     Breath sounds: Normal breath sounds.  Chest:     Chest wall: No tenderness.  Abdominal:     General: Bowel sounds are normal.     Palpations: Abdomen is soft.     Tenderness: There is abdominal tenderness in the epigastric area and left upper quadrant. There is no guarding or rebound. Negative signs include Murphy's sign and McBurney's sign.     Hernia: No hernia is present.  Musculoskeletal:        General: Normal range of motion.     Cervical back: Normal range of motion and neck supple.  Skin:    General: Skin is warm and dry.     Findings: No  rash.  Neurological:     Mental Status: He is alert and oriented to person, place, and time.     GCS: GCS eye subscore is 4. GCS verbal subscore is 5. GCS motor subscore is 6.     Cranial Nerves: No cranial nerve deficit.     Sensory: No sensory deficit.     Coordination: Coordination normal.  Psychiatric:        Speech: Speech normal.        Behavior: Behavior normal.        Thought Content: Thought content normal.    ED Results / Procedures / Treatments   Labs (all labs ordered are listed, but only abnormal results are displayed) Labs Reviewed  COMPREHENSIVE METABOLIC PANEL - Abnormal; Notable for the following components:      Result Value   Sodium 134 (*)    Glucose, Bld 103 (*)    Creatinine, Ser 0.59 (*)    Total Protein 8.9 (*)    All other components within normal limits  LIPASE, BLOOD - Abnormal; Notable for the following components:   Lipase 297 (*)    All other components within normal limits  CBC WITH DIFFERENTIAL/PLATELET - Abnormal; Notable for the following components:   RBC 7.10 (*)    MCV 71.5 (*)    MCH 22.3 (*)    RDW 22.2 (*)    All other components within normal limits  URINALYSIS, ROUTINE W REFLEX MICROSCOPIC - Abnormal; Notable for the following components:   APPearance HAZY (*)    Hgb urine dipstick MODERATE (*)    Ketones, ur 5 (*)    Protein, ur 30 (*)    Leukocytes,Ua TRACE (*)    All other components within normal limits  RESP PANEL BY RT-PCR (FLU A&B, COVID) [id number]    EKG None  Radiology No results found.  Procedures Procedures   Medications Ordered in ED Medications  sodium chloride 0.9 % bolus 1,000 mL (1,000 mLs Intravenous New Bag/Given 06/19/21 0541)  HYDROmorphone (DILAUDID) injection 1 mg (1 mg Intravenous Given 06/19/21 0542)  ondansetron (ZOFRAN) injection 4 mg (4 mg Intravenous Given 06/19/21 0543)  iohexol (OMNIPAQUE) 350 MG/ML injection 80 mL (80 mLs Intravenous Contrast Given 06/19/21 0650)    ED Course  I have  reviewed the triage vital signs and the nursing notes.  Pertinent labs & imaging results that were available during my care of the patient were reviewed by me and considered in my medical decision making (see chart for details).    MDM Rules/Calculators/A&P  Patient with upper abdominal pain, tenderness to the epigastric and left upper quadrant.  Patient recently started on Cabometyx.  He does have an elevated lipase consistent with pancreatitis.  Pancreatitis is likely secondary to the Cabometyx.  Discussed with Dr. Alvy Bimler, on-call for oncology.  Request hospitalization for the patient, stop the Cabometyx.  She will review the case.  Final Clinical Impression(s) / ED Diagnoses Final diagnoses:  Drug-induced acute pancreatitis, unspecified complication status    Rx / DC Orders ED Discharge Orders     None        Taegan Standage, Gwenyth Allegra, MD 06/19/21 [phone]

## 2021-06-19 NOTE — Plan of Care (Signed)

## 2021-06-19 NOTE — Telephone Encounter (Signed)
Called Mr. Menta to inform him of the February brain MRI and follow-up with Dr. Isidore Moos that is scheduled. He is actually in the hospital ED department with abdominal pain. He has my contact information to call back with questions about those appointments after his discharge.   Mont Dutton R.T.(R)(T) Radiation Special Procedures Navigator

## 2021-06-19 NOTE — H&P (Signed)
History and Physical    Dylan Good HEN:[id number] DOB: 11-18-1970 DOA: 06/18/2021  PCP: Patient, No Pcp Per (Inactive)  Patient coming from: Home  Chief Complaint: abdominal pain  HPI: Dylan Good is a 50 y.o. 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 Presenting abdominal pain. Symptoms started a week ago. It's mainly in the area of the epigastrium and umbilicus. It's mostly crampy pain w/ intermittent sharpness. Eating worsening the pain. He tried APAP and TUMS, but the didn't help. He reports some constipation. His symptoms didn't resolve yesterday, so he thought it best to come to the ED for evaluation. He denies any other aggravating or alleviating factors.    ED Course: Lipase was elevated. CT did not show any inflammatory changes. Biliary tree was normal. It showed an overall decrease in pulm nodules, adrenal mass size, retroperitoneal adenopathy, pleural masses. Onco was consulted. TRH was called for admission.    Review of Systems:  Denies CP, palpitations, dyspnea, N/V/D, fever. Reports constipation. Review of systems is otherwise negative for all not mentioned in HPI.   PMHx Past Medical History:  Diagnosis Date   Cancer, metastatic to lung [facility])    Renal cell adenocarcinoma, left ([place])   HTN  PSHx Past Surgical History:  Procedure Laterality Date   APPENDECTOMY     CHEST TUBE INSERTION Left 02/06/2021   Procedure: INSERTION PLEURAL DRAINAGE CATHETER;  Surgeon: Garner Nash, DO;  Location: [facility];  Service: Pulmonary;  Laterality: Left;   CHEST TUBE INSERTION N/A 04/04/2021   Procedure: removal of pleurex cath;  Surgeon: Garner Nash, DO;  Location: [place];  Service: Pulmonary;  Laterality: N/A;    SocHx  reports that he has been smoking cigarettes. He has been smoking an average of .5 packs per day. He has never used smokeless tobacco. He reports that he does not  drink alcohol Intermittent mj use.   No Known Allergies  FamHx History reviewed. No pertinent family history.  Prior to Admission medications   Medication Sig Start Date End Date Taking? Authorizing Provider  acetaminophen (TYLENOL) 500 MG tablet Take 1,000 mg by mouth every 6 (six) hours as needed (pain).   Yes [provider]  amLODipine (NORVASC) 5 MG tablet Take 1 tablet (5 mg total) by mouth daily. 06/05/21  Yes Heilingoetter, Cassandra L, PA-C  cabozantinib (CABOMETYX) 40 MG tablet Take 1 tablet (40 mg total) by mouth daily. Take on an empty stomach, 1 hour before or 2 hours after meals. 05/08/21  Yes [name], MD    Physical Exam: Vitals:   06/18/21 2110 06/19/21 0522 06/19/21 0623 06/19/21 0730  BP: (!) 157/109 (!) 142/116 (!) 152/100 (!) 156/100  Pulse: 81 75 65 64  Resp: 16 15 18 16   Temp: 97.9 F (36.6 C)     TempSrc: Oral     SpO2: 99% 98% 95% 97%  Weight: 79.4 kg     Height: 5\' 9"  (1.753 m)       General: 50 y.o. male resting in bed in NAD Eyes: PERRL, normal sclera ENMT: Nares patent w/o discharge, orophaynx clear, dentition normal, ears w/o discharge/lesions/ulcers Neck: Supple, trachea midline Cardiovascular: RRR, +S1, S2, no m/g/r, equal pulses throughout Respiratory: CTABL, no w/r/r, normal WOB GI: BS+, ND, epigastric TTP, no organomegaly noted MSK: No e/c/c Skin: No rashes, bruises, ulcerations noted Neuro: A&O x 3, no focal deficits Psyc: Appropriate interaction and affect,  calm/cooperative  Labs on Admission: I have personally reviewed following labs and imaging studies  CBC: Recent Labs  Lab 06/18/21 2240  WBC 4.3  NEUTROABS 2.4  HGB 15.8  HCT 50.8  MCV 71.5*  PLT 875   Basic Metabolic Panel: Recent Labs  Lab 06/18/21 2240  NA 134*  K 4.2  CL 102  CO2 24  GLUCOSE 103*  BUN 11  CREATININE 0.59*  CALCIUM 9.7   GFR: Estimated Creatinine Clearance: 112.9 mL/min (A) (by C-G formula based on SCr of 0.59 mg/dL  (L)). Liver Function Tests: Recent Labs  Lab 06/18/21 2240  AST 24  ALT 28  ALKPHOS 110  BILITOT 0.7  PROT 8.9*  ALBUMIN 3.9   Recent Labs  Lab 06/18/21 2240  LIPASE 297*   No results for input(s): AMMONIA in the last 168 hours. Coagulation Profile: No results for input(s): INR, PROTIME in the last 168 hours. Cardiac Enzymes: No results for input(s): CKTOTAL, CKMB, CKMBINDEX, TROPONINI in the last 168 hours. BNP (last 3 results) No results for input(s): PROBNP in the last 8760 hours. HbA1C: No results for input(s): HGBA1C in the last 72 hours. CBG: No results for input(s): GLUCAP in the last 168 hours. Lipid Profile: No results for input(s): CHOL, HDL, LDLCALC, TRIG, CHOLHDL, LDLDIRECT in the last 72 hours. Thyroid Function Tests: No results for input(s): TSH, T4TOTAL, FREET4, T3FREE, THYROIDAB in the last 72 hours. Anemia Panel: No results for input(s): VITAMINB12, FOLATE, FERRITIN, TIBC, IRON, RETICCTPCT in the last 72 hours. Urine analysis:    Component Value Date/Time   COLORURINE YELLOW 06/19/2021 0515   APPEARANCEUR HAZY (A) 06/19/2021 0515   LABSPEC 1.020 06/19/2021 0515   PHURINE 5.0 06/19/2021 0515   GLUCOSEU NEGATIVE 06/19/2021 0515   HGBUR MODERATE (A) 06/19/2021 0515   BILIRUBINUR NEGATIVE 06/19/2021 0515   KETONESUR 5 (A) 06/19/2021 0515   PROTEINUR 30 (A) 06/19/2021 0515   NITRITE NEGATIVE 06/19/2021 0515   LEUKOCYTESUR TRACE (A) 06/19/2021 0515    Radiological Exams on Admission: CT ABDOMEN PELVIS W CONTRAST  Result Date: 06/19/2021 CLINICAL DATA:  Abdominal pain, acute, nonlocalized. History of metastatic renal cell carcinoma on chemotherapy EXAM: CT ABDOMEN AND PELVIS WITH CONTRAST TECHNIQUE: Multidetector CT imaging of the abdomen and pelvis was performed using the standard protocol following bolus administration of intravenous contrast. CONTRAST:  86mL OMNIPAQUE IOHEXOL 350 MG/ML SOLN COMPARISON:  CT 04/16/2021 FINDINGS: Lower chest: Multiple  bilateral pulmonary nodules within the included lung bases, overall decreased in size and number compared to the previous CT of 04/16/2021. For reference, medial left lower lobe nodule measures 2.0 x 1.3 cm (series 3, image 42) previously 3.7 x 3.6 cm). Nodule within the medial right lung base inferiorly measures 0.8 cm (series 3, image 29), previously 1.4 cm. Trace left-sided pleural effusion, slightly decreased. Previously seen pleural based masses along the left lower lobe have also significantly decreased in size from prior. Partially visualized expansile mass of the lateral aspect of the left chest wall with destructive changes of the left fifth rib, incompletely characterized at the edge of the field of view. Heart size is normal. Hepatobiliary: No focal liver abnormality is seen. No gallstones, gallbladder wall thickening, or biliary dilatation. Pancreas: Unremarkable. No pancreatic ductal dilatation or surrounding inflammatory changes. Spleen: Normal in size without focal abnormality. Adrenals/Urinary Tract: Left adrenal mass measures approximately 5.2 x 2.1 cm (previously 6.4 x 2.9 cm). Right adrenal mass measures 3.2 x 1.6 cm (previously 3.5 x 2.1 cm). Large heterogeneous centrally necrotic mass  with multiple calcifications arising from the right kidney measuring approximately 13.0 x 10.4 cm trans axially (previously 15 x 13.5 cm). Mild right hydronephrosis. Solid left renal mass measures 2.3 x 2.2 cm trans axially (previously 2.9 x 2.9 cm). No left-sided hydronephrosis. Urinary bladder is within normal limits. Stomach/Bowel: Stomach is within normal limits. Appendix is surgically absent. No evidence of bowel wall thickening, distention, or inflammatory changes. Vascular/Lymphatic: Interval decrease in size and number of retroperitoneal lymph nodes. Reference nodes includes 1.0 cm left para-aortic node (series 2, image 46). Scattered atherosclerotic calcifications throughout the aortoiliac axis without  aneurysm. Reproductive: Prostate is unremarkable. Other: No ascites.  No abscess.  No free air. Musculoskeletal: Destructive lesion centered at the left fifth rib, as above. Chronic changes of bilateral sacroiliitis. No new suspicious bone lesion. IMPRESSION: 1. Findings compatible with favorable response to treatment with interval decrease in size of bilateral renal masses, bilateral adrenal gland masses, and decreasing retroperitoneal adenopathy. There is also a significant interval decrease in size and number of bilateral pulmonary nodules and left sided pleural based masses within the visualized lung bases. 2. Partially visualized expansile mass of the lateral aspect of the left chest wall with destructive changes of the left fifth rib, incompletely characterized at the edge of the field of view. 3. Mild right hydronephrosis. 4. Aortic atherosclerosis (ICD10-I70.0). Electronically Signed   By: Davina Poke D.O.   On: 06/19/2021 07:47    EKG: None obtained in ED  Assessment/Plan Abdominal pain Elevated lipase     - place in med-surg obs     - CT w/o discrete pancreatitis, but clinically that is what he is showing     - for now, fluids, anti-emetics, pain control, bowel rest     - add protonix     - biliary tree is clear on CT as well     - continue his chemo meds  Renal Cell Carcinoma w/ mets     - holding his chemo meds     - onco to see  HTN     - resume home regimen  Chronic dyspnea     - secondary to mets     - continue home inhalers  DVT prophylaxis: SCDs  Code Status: FULL  Family Communication: none at bedside  Consults called: EDP spoke with Onco (Dr. Alvy Bimler)   Status is: Observation  The patient remains OBS appropriate and will d/c before 2 midnights.  Jonnie Finner DO [facility]  If 7PM-7AM, please contact night-coverage [email or web address]  06/19/2021, 7:53 AM

## 2021-06-19 NOTE — ED Notes (Signed)
Patient denies pain and is resting comfortably.  

## 2021-06-20 DIAGNOSIS — K853 Drug induced acute pancreatitis without necrosis or infection: Principal | ICD-10-CM

## 2021-06-20 DIAGNOSIS — K859 Acute pancreatitis without necrosis or infection, unspecified: Secondary | ICD-10-CM

## 2021-06-20 DIAGNOSIS — C641 Malignant neoplasm of right kidney, except renal pelvis: Secondary | ICD-10-CM

## 2021-06-20 DIAGNOSIS — D72819 Decreased white blood cell count, unspecified: Secondary | ICD-10-CM

## 2021-06-20 DIAGNOSIS — E872 Acidosis, unspecified: Secondary | ICD-10-CM

## 2021-06-20 LAB — CBC
HCT: 43.2 % (ref 39.0–52.0)
Hemoglobin: 13.1 g/dL (ref 13.0–17.0)
MCH: 22.2 pg — ABNORMAL LOW (ref 26.0–34.0)
MCHC: 30.3 g/dL (ref 30.0–36.0)
MCV: 73.1 fL — ABNORMAL LOW (ref 80.0–100.0)
Platelets: 284 10*3/uL (ref 150–400)
RBC: 5.91 MIL/uL — ABNORMAL HIGH (ref 4.22–5.81)
RDW: 21.5 % — ABNORMAL HIGH (ref 11.5–15.5)
WBC: 3.4 10*3/uL — ABNORMAL LOW (ref 4.0–10.5)
nRBC: 0 % (ref 0.0–0.2)

## 2021-06-20 LAB — COMPREHENSIVE METABOLIC PANEL
ALT: 21 U/L (ref 0–44)
AST: 19 U/L (ref 15–41)
Albumin: 3.1 g/dL — ABNORMAL LOW (ref 3.5–5.0)
Alkaline Phosphatase: 90 U/L (ref 38–126)
Anion gap: 9 (ref 5–15)
BUN: 6 mg/dL (ref 6–20)
CO2: 21 mmol/L — ABNORMAL LOW (ref 22–32)
Calcium: 8.3 mg/dL — ABNORMAL LOW (ref 8.9–10.3)
Chloride: 105 mmol/L (ref 98–111)
Creatinine, Ser: 0.58 mg/dL — ABNORMAL LOW (ref 0.61–1.24)
GFR, Estimated: >60 mL/min (ref >60)
Glucose, Bld: 72 mg/dL (ref 70–99)
Potassium: 3.9 mmol/L (ref 3.5–5.1)
Sodium: 135 mmol/L (ref 135–145)
Total Bilirubin: 0.7 mg/dL (ref 0.3–1.2)
Total Protein: 6.9 g/dL (ref 6.5–8.1)

## 2021-06-20 LAB — LIPASE, BLOOD: Lipase: 168 U/L — ABNORMAL HIGH (ref 11–51)

## 2021-06-20 MED ORDER — POLYETHYLENE GLYCOL 3350 17 G PO PACK
17.0000 g | PACK | Freq: Two times a day (BID) | ORAL | Status: DC
  Administered 2021-06-20 – 2021-06-21 (×2): 17 g via ORAL
  Filled 2021-06-20 (×2): qty 1

## 2021-06-20 MED ORDER — ENOXAPARIN SODIUM 40 MG/0.4ML IJ SOSY
40.0000 mg | PREFILLED_SYRINGE | INTRAMUSCULAR | Status: DC
  Administered 2021-06-20: 20:00:00 40 mg via SUBCUTANEOUS
  Filled 2021-06-20: qty 0.4

## 2021-06-20 NOTE — Progress Notes (Signed)
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  His symptoms started about a week ago and is mainly in the area of the epigastric and umbilicus.  He describes it as a messy crampy abdominal pain with intermittent sharpness and states that eating worsens the pain.  He tried Tylenol and Tums but it did not help and he reported some constipation but since his symptoms did not resolve yesterday he is best to come to the ED for evaluation and he denied any aggravating alleviating factors.  On presentation his lipase was mildly elevated and CT scan did not show any inflammatory changes and it did show overall decrease in his pulmonary nodules, adrenal mass size and retroperitoneal adenopathy and pulm masses.  Medical oncology was consulted and he is admitted for his abdominal pain with concern for pancreatitis  Assessment & Plan:   Principal Problem:   Pancreatitis  Abdominal Pain with concern for Acute Pancreatitis  -Presented with crampy abdominal pain in the area of the epigastrium and umbilicus -He tried Tylenol and Tums but it did not help -Lipase level was elevated to 97 and trended down to 168 and his abdominal improved minimally improved -His pancreas was unremarkable with no pancreatic ductal dilatation or surrounding inflammatory changes -The suspected pancreatitis could be related to cabozantinib which is being currently held and medical oncology recommends continue current management supportive measures for the time being -He was initiated on fluids with IV normal saline at 100 MLS per hour -He was added on  Protonix 40 mg IV every 24 which we will continue -His biliary tree was clear on CT scan -Was initiated on bowel rest but will advance diet as tolerated and placed on a clear liquid diet -Continue with pain control IV hydromorphone 1 mg every 4 as needed severe pain as well as 5 mg oxycodone IR every 4 as needed -Continue with ondansetron 4 mg p.o./IV every 6 as needed nausea  -Continue to monitor and advance diet as tolerated and will need to tolerate a soft diet prior to safe discharge disposition  Hx of Left Kidney Renal Cell Adenocarcinoma with Mets to the Lungs -Was on Cabozantinib but this has now been held and resumed at outpatient and possibly a lower dose -CT scan on 06/19/2021 showed "Findings compatible with favorable response to treatment with interval decrease in size of bilateral renal masses, bilateral adrenal gland masses, and decreasing retroperitoneal adenopathy. There is also a significant interval decrease in size and number of bilateral pulmonary nodules and left sided pleural based masses within the visualized lung bases. Partially visualized expansile mass of the lateral aspect of the left chest wall with destructive changes of the left fifth rib, incompletely characterized at the edge of the field of view.  Mild right hydronephrosis. Aortic atherosclerosis."  -Medical oncology has been consulted and recommending continuing to hold his chemotherapy medication for now and recommending resuming an outpatient possibly at a lower dose -Patient has a follow-up February brain MRI and follow-up with Dr. Isidore Moos that is scheduled  Tobacco Abuse -Smoking Cessation Counseling given that he still smokes twice a week -If necessary will provide the patient a Nicotine  Patch  Hypertension -Continue amlodipine 5 mg p.o. daily -We will add IV hydralazine 10 mg every 8 as needed for high blood pressure -Continue monitor blood pressures per protocol -Last blood pressure reading was  149/93  Constipation -Initiated on docusate sodium 100 mg p.o. twice daily and will also add MiraLAX 17 g p.o. twice daily  Chronic Dyspnea -CT scan as above with improvement in his masses and decrease in the size and number of pulmonary nodules -Continue with fluticasone furoate-vilanterol 200-25 mg per actuation 1 puff IH daily  Leukopenia -Mild and patient's WBC went from 4.0 -> 4.3 -> 3.4 -Continue to monitor and trend and repeat CBC in a.m.  Metabolic Acidosis -Mild. CO2 is now 70 AG is 9, Chloride Level is 105 -C/w IVF Hydration -Continue to Monitor and Trend and Repeat CMP in the AM   DVT prophylaxis: SCDs; will add enoxaparin 40 mg subcu q. 24 Code Status: FULL CODE Family Communication: No family currently at bedside Disposition Plan: Pending further clinical improvement and tolerance of diet and anticipating discharge in next 24 to 48 hours  Status is: Inpatient  Remains inpatient appropriate because: Patient still needs to have improvement in his abdominal pain and tolerance of diet for safe discharge disposition  Consultants:  Medical oncology  Procedures: None  Antimicrobials:  Anti-infectives (From admission, onward)    None        Subjective: Seen and examined at bedside and she was doing some abdominal pain was not as bad.  We will advance diet to clear liquid diet given that he is n.p.o. and advance diet as tolerated.  No chest pain but has chronic shortness of breath.  No other concerns or plans at this time.  Objective: Vitals:   06/19/21 1736 06/19/21 1817 06/19/21 2147 06/20/21 0320  BP:  (!) 146/99 (!) 161/103 (!) 146/103  Pulse:  66 70 78  Resp:  18 20 20   Temp: 98.4 F (36.9 C) 97.7 F (36.5 C) 97.8 F (36.6 C) 97.7 F (36.5 C)  TempSrc: Oral Oral Oral Oral  SpO2:  98% 98%   Weight:  79.3 kg    Height:  5\' 9"  (1.753 m)      Intake/Output Summary (Last 24 hours) at 06/20/2021 0850 Last data filed at 06/20/2021 0600 Gross per 24  hour  Intake 2158.28 ml  Output --  Net 2158.28 ml   Filed Weights   06/18/21 2110 06/19/21 1817  Weight: 79.4 kg 79.3 kg   Examination: Physical Exam:  Constitutional: WN/WD overweight African-American male currently in NAD and appears calm but slightly uncomfortable Eyes: Lids and conjunctivae normal, sclerae anicteric  ENMT: External Ears, Nose appear normal. Grossly normal hearing. Neck: Appears normal, supple, no cervical masses, normal ROM, no appreciable thyromegaly; no appreciable JVD Respiratory: Diminished to auscultation bilaterally, no wheezing, rales, rhonchi or crackles. Normal respiratory effort and patient is not tachypenic. No accessory muscle use.  Unlabored breathing Cardiovascular: RRR, no murmurs / rubs / gallops. S1 and S2 auscultated.  1+ extremity edema Abdomen: Soft, little tender to palpate, distended second body habitus.  Bowel sounds positive.  GU: Deferred. Musculoskeletal: No clubbing / cyanosis of digits/nails. No joint deformity upper and lower extremities.  Skin: No rashes, lesions, ulcers on limited skin evaluation. No induration; Warm and dry.  Neurologic: CN 2-12 grossly intact with no focal deficits. Romberg sign and cerebellar reflexes not assessed.  Psychiatric: Normal judgment and insight. Alert and oriented x 3. Normal mood and appropriate affect.   Data  Reviewed: I have personally reviewed following labs and imaging studies  CBC: Recent Labs  Lab 06/18/21 2240 06/20/21 0528  WBC 4.3 3.4*  NEUTROABS 2.4  --   HGB 15.8 13.1  HCT 50.8 43.2  MCV 71.5* 73.1*  PLT 257 628   Basic Metabolic Panel: Recent Labs  Lab 06/18/21 2240 06/20/21 0528  NA 134* 135  K 4.2 3.9  CL 102 105  CO2 24 21*  GLUCOSE 103* 72  BUN 11 6  CREATININE 0.59* 0.58*  CALCIUM 9.7 8.3*   GFR: Estimated Creatinine Clearance: 112.9 mL/min (A) (by C-G formula based on SCr of 0.58 mg/dL (L)). Liver Function Tests: Recent Labs  Lab 06/18/21 2240  06/20/21 0528  AST 24 19  ALT 28 21  ALKPHOS 110 90  BILITOT 0.7 0.7  PROT 8.9* 6.9  ALBUMIN 3.9 3.1*   Recent Labs  Lab 06/18/21 2240  LIPASE 297*   No results for input(s): AMMONIA in the last 168 hours. Coagulation Profile: No results for input(s): INR, PROTIME in the last 168 hours. Cardiac Enzymes: No results for input(s): CKTOTAL, CKMB, CKMBINDEX, TROPONINI in the last 168 hours. BNP (last 3 results) No results for input(s): PROBNP in the last 8760 hours. HbA1C: No results for input(s): HGBA1C in the last 72 hours. CBG: No results for input(s): GLUCAP in the last 168 hours. Lipid Profile: No results for input(s): CHOL, HDL, LDLCALC, TRIG, CHOLHDL, LDLDIRECT in the last 72 hours. Thyroid Function Tests: No results for input(s): TSH, T4TOTAL, FREET4, T3FREE, THYROIDAB in the last 72 hours. Anemia Panel: No results for input(s): VITAMINB12, FOLATE, FERRITIN, TIBC, IRON, RETICCTPCT in the last 72 hours. Sepsis Labs: No results for input(s): PROCALCITON, LATICACIDVEN in the last 168 hours.  Recent Results (from the past 240 hour(s))  Resp Panel by RT-PCR (Flu A&B, Covid) Nasopharyngeal Swab     Status: None   Collection Time: 06/19/21  8:18 AM   Specimen: Nasopharyngeal Swab; Nasopharyngeal(NP) swabs in vial transport medium  Result Value Ref Range Status   SARS Coronavirus 2 by RT PCR NEGATIVE NEGATIVE Final    Comment: (NOTE) SARS-CoV-2 target nucleic acids are NOT DETECTED.  The SARS-CoV-2 RNA is generally detectable in upper respiratory specimens during the acute phase of infection. The lowest concentration of SARS-CoV-2 viral copies this assay can detect is 138 copies/mL. A negative result does not preclude SARS-Cov-2 infection and should not be used as the sole basis for treatment or other patient management decisions. A negative result may occur with  improper specimen collection/handling, submission of specimen other than nasopharyngeal swab, presence of  viral mutation(s) within the areas targeted by this assay, and inadequate number of viral copies(<138 copies/mL). A negative result must be combined with clinical observations, patient history, and epidemiological information. The expected result is Negative.  Fact Sheet for Patients:  EntrepreneurPulse.com.au  Fact Sheet for Healthcare Providers:  IncredibleEmployment.be  This test is no t yet approved or cleared by the [place] FDA and  has been authorized for detection and/or diagnosis of SARS-CoV-2 by FDA under an Emergency Use Authorization (EUA). This EUA will remain  in effect (meaning this test can be used) for the duration of the COVID-19 declaration under Section 564(b)(1) of the Act, 21 U.S.C.section 360bbb-3(b)(1), unless the authorization is terminated  or revoked sooner.       Influenza A by PCR NEGATIVE NEGATIVE Final   Influenza B by PCR NEGATIVE NEGATIVE Final    Comment: (NOTE) The Xpert Xpress SARS-CoV-2/FLU/RSV plus assay is  intended as an aid in the diagnosis of influenza from Nasopharyngeal swab specimens and should not be used as a sole basis for treatment. Nasal washings and aspirates are unacceptable for Xpert Xpress SARS-CoV-2/FLU/RSV testing.  Fact Sheet for Patients: EntrepreneurPulse.com.au  Fact Sheet for Healthcare Providers: IncredibleEmployment.be  This test is not yet approved or cleared by the [place] FDA and has been authorized for detection and/or diagnosis of SARS-CoV-2 by FDA under an Emergency Use Authorization (EUA). This EUA will remain in effect (meaning this test can be used) for the duration of the COVID-19 declaration under Section 564(b)(1) of the Act, 21 U.S.C. section 360bbb-3(b)(1), unless the authorization is terminated or revoked.  Performed at [facility], [place] [address] [place], [place] [address]     RN Pressure  Injury Documentation:     Estimated body mass index is 25.82 kg/m as calculated from the following:   Height as of this encounter: 5\' 9"  (1.753 m).   Weight as of this encounter: 79.3 kg.  Malnutrition Type:   Malnutrition Characteristics:   Nutrition Interventions:   Radiology Studies: CT ABDOMEN PELVIS W CONTRAST  Result Date: 06/19/2021 CLINICAL DATA:  Abdominal pain, acute, nonlocalized. History of metastatic renal cell carcinoma on chemotherapy EXAM: CT ABDOMEN AND PELVIS WITH CONTRAST TECHNIQUE: Multidetector CT imaging of the abdomen and pelvis was performed using the standard protocol following bolus administration of intravenous contrast. CONTRAST:  64mL OMNIPAQUE IOHEXOL 350 MG/ML SOLN COMPARISON:  CT 04/16/2021 FINDINGS: Lower chest: Multiple bilateral pulmonary nodules within the included lung bases, overall decreased in size and number compared to the previous CT of 04/16/2021. For reference, medial left lower lobe nodule measures 2.0 x 1.3 cm (series 3, image 42) previously 3.7 x 3.6 cm). Nodule within the medial right lung base inferiorly measures 0.8 cm (series 3, image 29), previously 1.4 cm. Trace left-sided pleural effusion, slightly decreased. Previously seen pleural based masses along the left lower lobe have also significantly decreased in size from prior. Partially visualized expansile mass of the lateral aspect of the left chest wall with destructive changes of the left fifth rib, incompletely characterized at the edge of the field of view. Heart size is normal. Hepatobiliary: No focal liver abnormality is seen. No gallstones, gallbladder wall thickening, or biliary dilatation. Pancreas: Unremarkable. No pancreatic ductal dilatation or surrounding inflammatory changes. Spleen: Normal in size without focal abnormality. Adrenals/Urinary Tract: Left adrenal mass measures approximately 5.2 x 2.1 cm (previously 6.4 x 2.9 cm). Right adrenal mass measures 3.2 x 1.6 cm (previously  3.5 x 2.1 cm). Large heterogeneous centrally necrotic mass with multiple calcifications arising from the right kidney measuring approximately 13.0 x 10.4 cm trans axially (previously 15 x 13.5 cm). Mild right hydronephrosis. Solid left renal mass measures 2.3 x 2.2 cm trans axially (previously 2.9 x 2.9 cm). No left-sided hydronephrosis. Urinary bladder is within normal limits. Stomach/Bowel: Stomach is within normal limits. Appendix is surgically absent. No evidence of bowel wall thickening, distention, or inflammatory changes. Vascular/Lymphatic: Interval decrease in size and number of retroperitoneal lymph nodes. Reference nodes includes 1.0 cm left para-aortic node (series 2, image 46). Scattered atherosclerotic calcifications throughout the aortoiliac axis without aneurysm. Reproductive: Prostate is unremarkable. Other: No ascites.  No abscess.  No free air. Musculoskeletal: Destructive lesion centered at the left fifth rib, as above. Chronic changes of bilateral sacroiliitis. No new suspicious bone lesion. IMPRESSION: 1. Findings compatible with favorable response to treatment with interval decrease in size of bilateral renal masses, bilateral adrenal  gland masses, and decreasing retroperitoneal adenopathy. There is also a significant interval decrease in size and number of bilateral pulmonary nodules and left sided pleural based masses within the visualized lung bases. 2. Partially visualized expansile mass of the lateral aspect of the left chest wall with destructive changes of the left fifth rib, incompletely characterized at the edge of the field of view. 3. Mild right hydronephrosis. 4. Aortic atherosclerosis (ICD10-I70.0). Electronically Signed   By: Davina Poke D.O.   On: 06/19/2021 07:47    Scheduled Meds:  amLODipine  5 mg Oral Daily   docusate sodium  100 mg Oral BID   fluticasone furoate-vilanterol  1 puff Inhalation Daily   pantoprazole (PROTONIX) IV  40 mg Intravenous Q24H    Continuous Infusions:  sodium chloride 100 mL/hr at 06/20/21 0335    LOS: 1 day   Kerney Elbe, DO [facility] PAGER is on AMION  If 7PM-7AM, please contact night-coverage [email or web address]

## 2021-06-20 NOTE — Progress Notes (Signed)
IP PROGRESS NOTE  Subjective:   Events noted.  Patient known to me with history of advanced renal cell carcinoma.  He is currently on cabozantinib which she has tolerated reasonably well up till this hospitalization.  He developed abdominal pain and found to have elevation in his lipase at 297.  Repeat testing today showed lipase down to 168.  His abdominal pain has improved currently.  Objective:  Vital signs in last 24 hours: Temp:  [97.7 F (36.5 C)-98.4 F (36.9 C)] 97.7 F (36.5 C) (12/14 0320) Pulse Rate:  [66-78] 78 (12/14 0320) Resp:  [16-20] 20 (12/14 0320) BP: (146-163)/(93-105) 146/103 (12/14 0320) SpO2:  [94 %-99 %] 98 % (12/13 2147) Weight:  [174 lb 13.2 oz (79.3 kg)] 174 lb 13.2 oz (79.3 kg) (12/13 1817) Weight change: -2.8 oz (-0.079 kg) Last BM Date: 06/16/21  Intake/Output from previous day: 12/13 0701 - 12/14 0700 In: 12158.3 [I.V.:2158.3; IV Piggyback:10000] Out: -  General: Alert, awake without distress. Head: Normocephalic atraumatic. Mouth: mucous membranes moist, pharynx normal without lesions Eyes: No scleral icterus.  Pupils are equal and round reactive to light. Resp: clear to auscultation bilaterally without rhonchi or wheezes or dullness to percussion. Cardio: regular rate and rhythm, S1, S2 normal, no murmur, click, rub or gallop GI: soft, non-tender; bowel sounds normal; no masses,  no organomegaly Musculoskeletal: No joint deformity or effusion. Neurological: No motor, sensory deficits.  Intact deep tendon reflexes. Skin: No rashes or lesions.    Lab Results: Recent Labs    06/18/21 2240 06/20/21 0528  WBC 4.3 3.4*  HGB 15.8 13.1  HCT 50.8 43.2  PLT 257 284    BMET Recent Labs    06/18/21 2240 06/20/21 0528  NA 134* 135  K 4.2 3.9  CL 102 105  CO2 24 21*  GLUCOSE 103* 72  BUN 11 6  CREATININE 0.59* 0.58*  CALCIUM 9.7 8.3*    Studies/Results: CT ABDOMEN PELVIS W CONTRAST  Result Date: 06/19/2021 CLINICAL DATA:   Abdominal pain, acute, nonlocalized. History of metastatic renal cell carcinoma on chemotherapy EXAM: CT ABDOMEN AND PELVIS WITH CONTRAST TECHNIQUE: Multidetector CT imaging of the abdomen and pelvis was performed using the standard protocol following bolus administration of intravenous contrast. CONTRAST:  77mL OMNIPAQUE IOHEXOL 350 MG/ML SOLN COMPARISON:  CT 04/16/2021 FINDINGS: Lower chest: Multiple bilateral pulmonary nodules within the included lung bases, overall decreased in size and number compared to the previous CT of 04/16/2021. For reference, medial left lower lobe nodule measures 2.0 x 1.3 cm (series 3, image 42) previously 3.7 x 3.6 cm). Nodule within the medial right lung base inferiorly measures 0.8 cm (series 3, image 29), previously 1.4 cm. Trace left-sided pleural effusion, slightly decreased. Previously seen pleural based masses along the left lower lobe have also significantly decreased in size from prior. Partially visualized expansile mass of the lateral aspect of the left chest wall with destructive changes of the left fifth rib, incompletely characterized at the edge of the field of view. Heart size is normal. Hepatobiliary: No focal liver abnormality is seen. No gallstones, gallbladder wall thickening, or biliary dilatation. Pancreas: Unremarkable. No pancreatic ductal dilatation or surrounding inflammatory changes. Spleen: Normal in size without focal abnormality. Adrenals/Urinary Tract: Left adrenal mass measures approximately 5.2 x 2.1 cm (previously 6.4 x 2.9 cm). Right adrenal mass measures 3.2 x 1.6 cm (previously 3.5 x 2.1 cm). Large heterogeneous centrally necrotic mass with multiple calcifications arising from the right kidney measuring approximately 13.0 x 10.4 cm trans axially (  previously 15 x 13.5 cm). Mild right hydronephrosis. Solid left renal mass measures 2.3 x 2.2 cm trans axially (previously 2.9 x 2.9 cm). No left-sided hydronephrosis. Urinary bladder is within normal  limits. Stomach/Bowel: Stomach is within normal limits. Appendix is surgically absent. No evidence of bowel wall thickening, distention, or inflammatory changes. Vascular/Lymphatic: Interval decrease in size and number of retroperitoneal lymph nodes. Reference nodes includes 1.0 cm left para-aortic node (series 2, image 46). Scattered atherosclerotic calcifications throughout the aortoiliac axis without aneurysm. Reproductive: Prostate is unremarkable. Other: No ascites.  No abscess.  No free air. Musculoskeletal: Destructive lesion centered at the left fifth rib, as above. Chronic changes of bilateral sacroiliitis. No new suspicious bone lesion. IMPRESSION: 1. Findings compatible with favorable response to treatment with interval decrease in size of bilateral renal masses, bilateral adrenal gland masses, and decreasing retroperitoneal adenopathy. There is also a significant interval decrease in size and number of bilateral pulmonary nodules and left sided pleural based masses within the visualized lung bases. 2. Partially visualized expansile mass of the lateral aspect of the left chest wall with destructive changes of the left fifth rib, incompletely characterized at the edge of the field of view. 3. Mild right hydronephrosis. 4. Aortic atherosclerosis (ICD10-I70.0). Electronically Signed   By: Davina Poke D.O.   On: 06/19/2021 07:47    Medications: I have reviewed the patient's current medications.  Assessment/Plan:  50 year old with:  1.  Acute pancreatitis associated with abdominal pain and GI symptoms.  Could be related to cabozantinib which I recommended holding for the time being.  I agree with the current management and supportive measures for the time being.  2.  Metastatic renal cancer: CT scan obtained on 06/19/2021 showed positive response to therapy compared to previous imaging in October 2022 indicating positive response.  Have recommended continue to hold cabozantinib which will be  resumed as an outpatient possibly at a lower dose.  3.  Disposition and follow-up: I do not have any objections to discharge once felt medically ready by the primary team.  25  minutes were dedicated to this visit.  50% of the time was face-to-face with the patient and his family.  The time was discussed to reviewing disease status, imaging studies and management choices for the future.    LOS: 1 day   Zola Button 06/20/2021, 12:17 PM

## 2021-06-20 NOTE — Clinical Social Work Note (Signed)
°  Transition of Care [facility]) Screening Note   Patient Details  Name: Dylan Good Date of Birth: Jan 18, 1971   Transition [facility] Care [facility]) CM/SW Contact:    Trish Mage, LCSW Phone Number: 06/20/2021, 3:57 PM    Transition of Care Department [facility]) has reviewed patient and no TOC needs have been identified at this time. We will continue to monitor patient advancement through interdisciplinary progression rounds. If new patient transition needs arise, please place a TOC consult.

## 2021-06-21 ENCOUNTER — Inpatient Hospital Stay (HOSPITAL_COMMUNITY): Payer: Medicaid Other

## 2021-06-21 DIAGNOSIS — M25551 Pain in right hip: Secondary | ICD-10-CM

## 2021-06-21 DIAGNOSIS — M25552 Pain in left hip: Secondary | ICD-10-CM

## 2021-06-21 LAB — CBC WITH DIFFERENTIAL/PLATELET
Abs Immature Granulocytes: 0.01 10*3/uL (ref 0.00–0.07)
Basophils Absolute: 0 10*3/uL (ref 0.0–0.1)
Basophils Relative: 1 %
Eosinophils Absolute: 0.1 10*3/uL (ref 0.0–0.5)
Eosinophils Relative: 4 %
HCT: 44.8 % (ref 39.0–52.0)
Hemoglobin: 13.8 g/dL (ref 13.0–17.0)
Immature Granulocytes: 0 %
Lymphocytes Relative: 30 %
Lymphs Abs: 1 10*3/uL (ref 0.7–4.0)
MCH: 22.4 pg — ABNORMAL LOW (ref 26.0–34.0)
MCHC: 30.8 g/dL (ref 30.0–36.0)
MCV: 72.8 fL — ABNORMAL LOW (ref 80.0–100.0)
Monocytes Absolute: 0.4 10*3/uL (ref 0.1–1.0)
Monocytes Relative: 13 %
Neutro Abs: 1.7 10*3/uL (ref 1.7–7.7)
Neutrophils Relative %: 52 %
Platelets: 238 10*3/uL (ref 150–400)
RBC: 6.15 MIL/uL — ABNORMAL HIGH (ref 4.22–5.81)
RDW: 21.7 % — ABNORMAL HIGH (ref 11.5–15.5)
WBC: 3.2 10*3/uL — ABNORMAL LOW (ref 4.0–10.5)
nRBC: 0 % (ref 0.0–0.2)

## 2021-06-21 LAB — COMPREHENSIVE METABOLIC PANEL
ALT: 22 U/L (ref 0–44)
AST: 16 U/L (ref 15–41)
Albumin: 3.1 g/dL — ABNORMAL LOW (ref 3.5–5.0)
Alkaline Phosphatase: 90 U/L (ref 38–126)
Anion gap: 8 (ref 5–15)
BUN: <5 mg/dL — ABNORMAL LOW (ref 6–20)
CO2: 22 mmol/L (ref 22–32)
Calcium: 8.1 mg/dL — ABNORMAL LOW (ref 8.9–10.3)
Chloride: 105 mmol/L (ref 98–111)
Creatinine, Ser: 0.54 mg/dL — ABNORMAL LOW (ref 0.61–1.24)
GFR, Estimated: >60 mL/min (ref >60)
Glucose, Bld: 89 mg/dL (ref 70–99)
Potassium: 3.8 mmol/L (ref 3.5–5.1)
Sodium: 135 mmol/L (ref 135–145)
Total Bilirubin: 0.7 mg/dL (ref 0.3–1.2)
Total Protein: 7.2 g/dL (ref 6.5–8.1)

## 2021-06-21 LAB — LIPASE, BLOOD: Lipase: 268 U/L — ABNORMAL HIGH (ref 11–51)

## 2021-06-21 LAB — MAGNESIUM: Magnesium: 1.5 mg/dL — ABNORMAL LOW (ref 1.7–2.4)

## 2021-06-21 LAB — PHOSPHORUS: Phosphorus: 1.9 mg/dL — ABNORMAL LOW (ref 2.5–4.6)

## 2021-06-21 MED ORDER — DICLOFENAC SODIUM 1 % EX GEL: 2.0000 g | Freq: Four times a day (QID) | CUTANEOUS | 0 refills | Status: DC

## 2021-06-21 MED ORDER — MAGNESIUM SULFATE 2 GM/50ML IV SOLN
2.0000 g | Freq: Once | INTRAVENOUS | Status: AC
  Administered 2021-06-21: 2 g via INTRAVENOUS
  Filled 2021-06-21: qty 50

## 2021-06-21 MED ORDER — ONDANSETRON HCL 4 MG PO TABS: 4.0000 mg | ORAL_TABLET | Freq: Four times a day (QID) | ORAL | 0 refills | Status: DC | PRN

## 2021-06-21 MED ORDER — SODIUM CHLORIDE 0.9 % IV BOLUS
500.0000 mL | Freq: Once | INTRAVENOUS | Status: AC
  Administered 2021-06-21: 500 mL via INTRAVENOUS

## 2021-06-21 MED ORDER — KETOROLAC TROMETHAMINE 30 MG/ML IJ SOLN
30.0000 mg | Freq: Once | INTRAMUSCULAR | Status: AC
  Administered 2021-06-21: 30 mg via INTRAVENOUS
  Filled 2021-06-21: qty 1

## 2021-06-21 MED ORDER — OXYCODONE HCL 5 MG PO TABS: 5.0000 mg | ORAL_TABLET | Freq: Four times a day (QID) | ORAL | 0 refills | Status: DC | PRN

## 2021-06-21 MED ORDER — DOCUSATE SODIUM 100 MG PO CAPS: 100.0000 mg | ORAL_CAPSULE | Freq: Two times a day (BID) | ORAL | 0 refills | Status: DC

## 2021-06-21 MED ORDER — PANTOPRAZOLE SODIUM 40 MG PO TBEC: 40.0000 mg | DELAYED_RELEASE_TABLET | Freq: Every day | ORAL | 0 refills | Status: DC

## 2021-06-21 MED ORDER — K PHOS MONO-SOD PHOS DI & MONO 155-852-130 MG PO TABS
500.0000 mg | ORAL_TABLET | Freq: Two times a day (BID) | ORAL | Status: DC
  Administered 2021-06-21: 500 mg via ORAL
  Filled 2021-06-21 (×2): qty 2

## 2021-06-21 MED ORDER — DICLOFENAC SODIUM 1 % EX GEL
2.0000 g | Freq: Four times a day (QID) | CUTANEOUS | Status: DC
  Administered 2021-06-21 (×2): 2 g via TOPICAL
  Filled 2021-06-21: qty 100

## 2021-06-21 MED ORDER — POLYETHYLENE GLYCOL 3350 17 G PO PACK: 17.0000 g | PACK | Freq: Every day | ORAL | 0 refills | Status: DC

## 2021-06-21 NOTE — Progress Notes (Signed)
OT Cancellation Note  Patient Details Name: Dylan Good MRN: [id number] DOB: 10/13/1970   Cancelled Treatment:    Reason Eval/Treat Not Completed: Patient declined OT eval.  OT discussed role of OT, and patient stating he is at his baseline.  Observed patient up and walking ad lib in his room.  Patient stating he had no problems getting himself dressed for discharge.  OT follow up with nurse to ensure no needs, and to notify RN of patient declining OT eval.  MD to be notified.    Andrianna Manalang D Angela Vazguez 06/21/2021, 4:51 PM

## 2021-06-21 NOTE — Evaluation (Signed)
Physical Therapy Evaluation Patient Details Name: Dylan Good MRN: [id number] DOB: 1971-01-27 Today's Date: 06/21/2021  History of Present Illness  Pt is 50 yo male admitted on 06/18/21 with abdominal pain with concern for acute pancreatitis. Pt with bil hip pain - xray shows OA.  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  Clinical Impression  Pt admitted with above diagnosis.  Pt normally independent.  He has a cane and shower chair at home - denies need for RW.  Pt was able to ambulate 150' and performed flight of steps. He reports pain is significantly improved and feels near baseline. Did educate on use of AD as needed, compensatory technique for gait/transfers, and use of modalities for pain control as needed.         Recommendations for follow up therapy are one component of a multi-disciplinary discharge planning process, led by the attending physician.  Recommendations may be updated based on patient status, additional functional criteria and insurance authorization.  Follow Up Recommendations No PT follow up    Assistance Recommended at Discharge None  Functional Status Assessment Patient has not had a recent decline in their functional status  Equipment Recommendations  None recommended by PT    Recommendations for Other Services       Precautions / Restrictions Precautions Precautions: None      Mobility  Bed Mobility Overal bed mobility: Independent                  Transfers Overall transfer level: Independent                 General transfer comment: Pt up ambulating in room at arrival    Ambulation/Gait Ambulation/Gait assistance: Independent Gait Distance (Feet): 200 Feet Assistive device: None Gait Pattern/deviations: Step-through pattern;Wide base of support       General Gait Details: Wide BOS with increased lateral sway but steady; reports baseline; educated on use of AD if needed for pain  control  Stairs Stairs: Yes Stairs assistance: Supervision Stair Management: One rail Right;Two rails;Alternating pattern;Forwards;Step to pattern Number of Stairs: 10 General stair comments: Varied between 1-2 rails; alternating pattern up and step to down; educated on step to and "up with good down with bad" if needed  Wheelchair Mobility    Modified Rankin (Stroke Patients Only)       Balance Overall balance assessment: Independent   Sitting balance-Leahy Scale: Normal     Standing balance support: No upper extremity supported Standing balance-Leahy Scale: Good                               Pertinent Vitals/Pain Pain Assessment: 0-10 Pain Score: 3  Pain Location: bil upper thighs Pain Descriptors / Indicators: Throbbing Pain Intervention(s): Limited activity within patient's tolerance;Monitored during session    Gray expects to be discharged to:: Private residence Living Arrangements: Spouse/significant other Available Help at Discharge: Family;Available PRN/intermittently Type of Home: Apartment Home Access: Level entry     Alternate Level Stairs-Number of Steps: 9 Home Layout: Two level;Bed/bath upstairs;1/2 bath on main level Home Equipment: Grab bars - tub/shower;Shower seat;Cane - single point      Prior Function Prior Level of Function : Independent/Modified Independent;Driving                     Hand Dominance        Extremity/Trunk Assessment  Upper Extremity Assessment Upper Extremity Assessment: Overall WFL for tasks assessed    Lower Extremity Assessment Lower Extremity Assessment: LLE deficits/detail;RLE deficits/detail RLE Deficits / Details: ROM WFL (noted genu varus bil); MMT: ankle and knee 5/5 , hip flex/abd 4/5 and painful, hip add 4-/5 and painful LLE Deficits / Details: ROM WFL (noted genu varus bil); MMT: ankle and knee 5/5 , hip flex/abd 4/5 and painful, hip add 4-/5 and painful     Cervical / Trunk Assessment Cervical / Trunk Assessment: Normal  Communication   Communication: No difficulties  Cognition Arousal/Alertness: Awake/alert Behavior During Therapy: WFL for tasks assessed/performed Overall Cognitive Status: Within Functional Limits for tasks assessed                                          General Comments General comments (skin integrity, edema, etc.): Pt reports pain was in bil upper thighs earlier.  States significant relief now.  Pt using Voltarian gel.  Reports occasionally has pain at home and uses heating pad. Discussed pain control with heat as needed, could also use meds/gel as prescribed by MD.Educated pt on compensatory techniques with gait, transfers, stairs for pain control.  Also, discussed ROM exercises as needed to maintain motion and decrease pain.  Pt feeling he is close to baseline.  Did discuss could pursue outpt PT for pain control if pain returns.    Exercises     Assessment/Plan    PT Assessment Patient does not need any further PT services  PT Problem List         PT Treatment Interventions      PT Goals (Current goals can be found in the Care Plan section)  Acute Rehab PT Goals Patient Stated Goal: return home PT Goal Formulation: All assessment and education complete, DC therapy    Frequency     Barriers to discharge        Co-evaluation               AM-PAC PT "6 Clicks" Mobility  Outcome Measure Help needed turning from your back to your side while in a flat bed without using bedrails?: None Help needed moving from lying on your back to sitting on the side of a flat bed without using bedrails?: None Help needed moving to and from a bed to a chair (including a wheelchair)?: None Help needed standing up from a chair using your arms (e.g., wheelchair or bedside chair)?: None Help needed to walk in hospital room?: None Help needed climbing 3-5 steps with a railing? : A Little 6 Click  Score: 23    End of Session   Activity Tolerance: Patient tolerated treatment well Patient left: in chair Nurse Communication: Mobility status PT Visit Diagnosis: Pain Pain - part of body: Hip    Time: 6734-1937 PT Time Calculation (min) (ACUTE ONLY): 12 min   Charges:   PT Evaluation $PT Eval Low Complexity: 1 Low          Junah Yam, PT Acute Rehab Services Pager [phone] [facility] [phone]   Karlton Lemon 06/21/2021, 5:40 PM

## 2021-06-21 NOTE — Discharge Summary (Addendum)
Physician Discharge Summary  MUADH CREASY ZCH:[id number] DOB: 1971-01-15 DOA: 06/18/2021  PCP: Patient, No Pcp Per (Inactive)  Admit date: 06/18/2021 Discharge date: 06/21/2021  Admitted From: Home Disposition: Home  Recommendations for Outpatient Follow-up:  Follow up with PCP in 1-2 weeks Follow-up with medical oncology [name] within 1 to 2 weeks Follow-up with orthopedic surgery Dr. 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  His symptoms started about a week ago and is mainly in the area of the epigastric and umbilicus.  He describes it as a messy crampy abdominal pain with intermittent sharpness and states that eating worsens the pain.  He tried Tylenol and Tums but it did not help and he reported some constipation but since his symptoms did not resolve yesterday he is best to come to the ED for evaluation and he denied any aggravating alleviating factors.  On presentation his lipase was mildly elevated and CT scan did not show any inflammatory changes and it did show overall decrease in his pulmonary nodules, adrenal mass size and retroperitoneal adenopathy and pulm masses.  Medical oncology was consulted and he is admitted for his abdominal pain with concern for pancreatitis.  His lipase trended down initially but then trended back up but patient was able to tolerate a full and soft diet without issues and ate 90% of his meal without having abdominal pain.   He improved from that standpoint but then prior to discharge he complained of significant thigh and hip pain and an x-ray was done of bilateral hips which showed osteoarthritis.  Patient also noted to have sacroiliitis on CT scan imaging.  PT OT evaluated and recommended no follow-up.  Patient was given analgesics with improvement in his pain.  He was told to follow-up with orthopedic surgery in the outpatient setting.  He has been medically stable to be discharged home and will follow-up with his PCP, medical oncologist as well as orthopedic surgeon outpatient setting.   Discharge Diagnoses:  Principal Problem:   Pancreatitis  Abdominal Pain with concern for Acute Pancreatitis  -Presented with crampy abdominal pain in the area of the epigastrium and umbilicus -He tried Tylenol and Tums but it did not help -Lipase level was elevated at 297 and trended down to 168 and his abdominal improved but then lipase trended back up to 268 but his abdominal pain resolved -His pancreas was unremarkable with no pancreatic ductal dilatation or surrounding inflammatory changes -The suspected pancreatitis could be related to cabozantinib which is being currently held and medical oncology recommends continue current management supportive measures for the time being -He was initiated on fluids with IV normal saline at 100 MLS per hour -He was added on Protonix 40 mg IV every 24 which we will continue -His biliary tree was clear on CT scan -Was initiated on bowel rest but will advance diet as tolerated and placed on a clear liquid diet -Continue with pain control IV hydromorphone 1 mg every 4 as needed severe pain as well as  5 mg oxycodone IR every 4 as needed -Continue with ondansetron 4 mg p.o./IV every 6 as needed nausea  -Diet was advanced and he tolerated a soft diet without issues -If continues to have abdominal pain follow-up with GI in outpatient setting   Hx of Left Kidney Renal Cell Adenocarcinoma with  Mets to the Lungs -Was on Cabozantinib but this has now been held and resumed at outpatient and possibly a lower dose -CT scan on 06/19/2021 showed "Findings compatible with favorable response to treatment with interval decrease in size of bilateral renal masses, bilateral adrenal gland masses, and decreasing retroperitoneal adenopathy. There is also a significant interval decrease in size and number of bilateral pulmonary nodules and left sided pleural based masses within the visualized lung bases. Partially visualized expansile mass of the lateral aspect of the left chest wall with destructive changes of the left fifth rib, incompletely characterized at the edge of the field of view.  Mild right hydronephrosis. Aortic atherosclerosis."  -Medical oncology has been consulted and recommending continuing to hold his chemotherapy medication for now and recommending resuming an outpatient possibly at a lower dose -Patient has a follow-up February brain MRI and follow-up with Dr. Isidore Moos that is scheduled -Follow-up with [name] in outpatient setting   Tobacco Abuse -Smoking Cessation Counseling given that he still smokes twice a week -If necessary will provide the patient a Nicotine Patch   Hypertension -Continue amlodipine 5 mg p.o. daily -We will add IV hydralazine 10 mg every 8 as needed for high blood pressure -Continue monitor blood pressures per protocol -Last blood pressure reading was 152/98   Constipation -Initiated on docusate sodium 100 mg p.o. twice daily and will also add MiraLAX 17 g p.o. twice daily  Bilateral hip pain -Complain of significant hip pain and stiffness this morning -Given some IV ketorolac with improvement as well as p.o. oxycodone -DG hip showed "Mild symmetrical bilateral hip osteoarthritis. No acute bony abnormalities." -CT Scan showed "Musculoskeletal: Destructive lesion centered at the left fifth rib, as above. Chronic changes of bilateral sacroiliitis. No new  suspicious bone lesion." -Follow-up with orthopedic surgeon outpatient setting   Chronic Dyspnea -CT scan as above with improvement in his masses and decrease in the size and number of pulmonary nodules -Continue with fluticasone furoate-vilanterol 200-25 mg per actuation 1 puff IH daily   Leukopenia -Mild and patient's WBC went from 4.0 -> 4.3 -> 3.4 and was 3.2 -Continue to monitor and trend and repeat CBC in a.m.   Metabolic Acidosis -Mild. CO2 is now 70 AG is 8, Chloride Level is 105 -C/w IVF Hydration while hospitalized -Continue to Monitor and Trend and Repeat CMP in the AM  Hypophosphatemia -Patient's phosphorus level is 1.9 -Replete prior to discharge  -continue to monitor and trend and repeat phosphorus level within 1 week  Hypomagnesemia -Mag level is 1.5 -Replete with IV mag sulfate 2 g -Continue to monitor and replete as necessary -Repeat CMP in a.m.  Discharge Instructions  Discharge Instructions     Call MD for:  difficulty breathing, headache or visual disturbances   Complete by: As directed    Call MD for:  difficulty breathing, headache or visual disturbances   Complete by: As directed    Call MD for:  extreme fatigue   Complete by: As directed    Call MD for:  extreme fatigue   Complete by: As directed    Call MD for:  hives   Complete by: As directed    Call  MD for:  hives   Complete by: As directed    Call MD for:  persistant dizziness or light-headedness   Complete by: As directed    Call MD for:  persistant dizziness or light-headedness   Complete by: As directed    Call MD for:  persistant nausea and vomiting   Complete by: As directed    Call MD for:  persistant nausea and vomiting   Complete by: As directed    Call MD for:  redness, tenderness, or signs of infection (pain, swelling, redness, odor or green/yellow discharge around incision site)   Complete by: As directed    Call MD for:  redness, tenderness, or signs of infection (pain,  swelling, redness, odor or green/yellow discharge around incision site)   Complete by: As directed    Call MD for:  severe uncontrolled pain   Complete by: As directed    Call MD for:  severe uncontrolled pain   Complete by: As directed    Call MD for:  temperature >100.4   Complete by: As directed    Call MD for:  temperature >100.4   Complete by: As directed    Diet - low sodium heart healthy   Complete by: As directed    Diet - low sodium heart healthy   Complete by: As directed    Discharge instructions   Complete by: As directed    You were cared for by a hospitalist during your hospital stay. If you have any questions about your discharge medications or the care you received while you were in the hospital after you are discharged, you can call the unit and ask to speak with the hospitalist on call if the hospitalist that took care of you is not available. Once you are discharged, your primary care physician will handle any further medical issues. Please note that NO REFILLS for any discharge medications will be authorized once you are discharged, as it is imperative that you return to your primary care physician (or establish a relationship with a primary care physician if you do not have one) for your aftercare needs so that they can reassess your need for medications and monitor your lab values.  Follow up with PCP, Medical Oncology, and Orthopedic Surgery in the outpatient setting. Take all medications as prescribed. If symptoms change or worsen please return to the ED for evaluation   Discharge instructions   Complete by: As directed    You were cared for by a hospitalist during your hospital stay. If you have any questions about your discharge medications or the care you received while you were in the hospital after you are discharged, you can call the unit and ask to speak with the hospitalist on call if the hospitalist that took care of you is not available. Once you are discharged,  your primary care physician will handle any further medical issues. Please note that NO REFILLS for any discharge medications will be authorized once you are discharged, as it is imperative that you return to your primary care physician (or establish a relationship with a primary care physician if you do not have one) for your aftercare needs so that they can reassess your need for medications and monitor your lab values.  Follow up with PCP, Medical Oncology, and Orthopedic Surgery as an outpateint. Take all medications as prescribed. If symptoms change or worsen please return to the ED for evaluation   Increase activity slowly   Complete by: As directed  Increase activity slowly   Complete by: As directed       Allergies as of 06/21/2021   No Known Allergies      Medication List     STOP taking these medications    Cabometyx 40 MG tablet Generic drug: cabozantinib       TAKE these medications    acetaminophen 500 MG tablet Commonly known as: TYLENOL Take 1,000 mg by mouth every 6 (six) hours as needed (pain).   amLODipine 5 MG tablet Commonly known as: Norvasc Take 1 tablet (5 mg total) by mouth daily.   diclofenac Sodium 1 % Gel Commonly known as: VOLTAREN Apply 2 g topically 4 (four) times daily.   docusate sodium 100 MG capsule Commonly known as: COLACE Take 1 capsule (100 mg total) by mouth 2 (two) times daily.   ondansetron 4 MG tablet Commonly known as: ZOFRAN Take 1 tablet (4 mg total) by mouth every 6 (six) hours as needed for nausea.   oxyCODONE 5 MG immediate release tablet Commonly known as: Oxy IR/ROXICODONE Take 1 tablet (5 mg total) by mouth every 6 (six) hours as needed for moderate pain.   pantoprazole 40 MG tablet Commonly known as: Protonix Take 1 tablet (40 mg total) by mouth daily.   polyethylene glycol 17 g packet Commonly known as: MIRALAX / GLYCOLAX Take 17 g by mouth daily.        Follow-up Information     [name],  MD Follow up.   Specialty: Oncology Why: Follow up within 1-2 weeks Contact information: [place] [address] [phone]         Marchia Bond, MD Follow up.   Specialty: Orthopedic Surgery Why: Follow up for Bilateral Hip Pain and Sacroilitis Contact information: [address] [place] [address] [phone]                No Known Allergies  Consultations: Medical oncology  Procedures/Studies: MR BRAIN W WO CONTRAST  Result Date: 06/13/2021 CLINICAL DATA:  SRS restaging RCC metastases. Status post radiation 02/24/2021 EXAM: MRI HEAD WITHOUT AND WITH CONTRAST TECHNIQUE: Multiplanar, multiecho pulse sequences of the brain and surrounding structures were obtained without and with intravenous contrast. CONTRAST:  61mL MULTIHANCE GADOBENATE DIMEGLUMINE 529 MG/ML IV SOLN COMPARISON:  01/19/2021 FINDINGS: Brain: No restricted diffusion to suggest acute or subacute infarct. Previously noted enhancing lesion in the left temporal lobe is now T1 hypointense and now no longer enhances (series 12, image 83), with minimal adjacent enhancement (series 12, image 81), which may be post treatment related, and resolution of previously noted adjacent edema. Additional previously noted punctate focus of enhancement in the medial right temporal lobe no longer definitively enhances (series 12, image 70). Small T1 hyperintense focus in the medial left cerebellar hemisphere (series 12, image 61), measuring 5 x 2 mm (series 9, image 65). An adjacent developmental venous anomaly was present on the prior exam, but the small T1 hyperintense was not. The focus is associated with susceptibility (series 6, image 35). No acute hemorrhage, mass, mass effect, or midline shift. No extra-axial collection or hydrocephalus. T2 hyperintense signal in the periventricular white matter, likely the sequela of chronic small vessel ischemic disease. Vascular: Normal flow  voids. Skull and upper cervical spine: Normal marrow signal. Sinuses/Orbits: Negative. Other: The mastoids are well aerated. IMPRESSION: 1. Previously noted enhancing lesion in the left temporal lobe no longer enhances, with minimal adjacent enhancement, likely post treatment. Additional punctate focus of  enhancement in the medial right temporal lobe is also no longer seen. 2. T1 hyperintense focus with associated susceptibility in the medial left cerebellum, which is suspicious for metastasis given development since July, however this appearance could reflect hemorrhage into a cavernoma given adjacent DVA. Electronically Signed   By: Merilyn Baba M.D.   On: 06/13/2021 14:19   CT ABDOMEN PELVIS W CONTRAST  Result Date: 06/19/2021 CLINICAL DATA:  Abdominal pain, acute, nonlocalized. History of metastatic renal cell carcinoma on chemotherapy EXAM: CT ABDOMEN AND PELVIS WITH CONTRAST TECHNIQUE: Multidetector CT imaging of the abdomen and pelvis was performed using the standard protocol following bolus administration of intravenous contrast. CONTRAST:  54mL OMNIPAQUE IOHEXOL 350 MG/ML SOLN COMPARISON:  CT 04/16/2021 FINDINGS: Lower chest: Multiple bilateral pulmonary nodules within the included lung bases, overall decreased in size and number compared to the previous CT of 04/16/2021. For reference, medial left lower lobe nodule measures 2.0 x 1.3 cm (series 3, image 42) previously 3.7 x 3.6 cm). Nodule within the medial right lung base inferiorly measures 0.8 cm (series 3, image 29), previously 1.4 cm. Trace left-sided pleural effusion, slightly decreased. Previously seen pleural based masses along the left lower lobe have also significantly decreased in size from prior. Partially visualized expansile mass of the lateral aspect of the left chest wall with destructive changes of the left fifth rib, incompletely characterized at the edge of the field of view. Heart size is normal. Hepatobiliary: No focal liver  abnormality is seen. No gallstones, gallbladder wall thickening, or biliary dilatation. Pancreas: Unremarkable. No pancreatic ductal dilatation or surrounding inflammatory changes. Spleen: Normal in size without focal abnormality. Adrenals/Urinary Tract: Left adrenal mass measures approximately 5.2 x 2.1 cm (previously 6.4 x 2.9 cm). Right adrenal mass measures 3.2 x 1.6 cm (previously 3.5 x 2.1 cm). Large heterogeneous centrally necrotic mass with multiple calcifications arising from the right kidney measuring approximately 13.0 x 10.4 cm trans axially (previously 15 x 13.5 cm). Mild right hydronephrosis. Solid left renal mass measures 2.3 x 2.2 cm trans axially (previously 2.9 x 2.9 cm). No left-sided hydronephrosis. Urinary bladder is within normal limits. Stomach/Bowel: Stomach is within normal limits. Appendix is surgically absent. No evidence of bowel wall thickening, distention, or inflammatory changes. Vascular/Lymphatic: Interval decrease in size and number of retroperitoneal lymph nodes. Reference nodes includes 1.0 cm left para-aortic node (series 2, image 46). Scattered atherosclerotic calcifications throughout the aortoiliac axis without aneurysm. Reproductive: Prostate is unremarkable. Other: No ascites.  No abscess.  No free air. Musculoskeletal: Destructive lesion centered at the left fifth rib, as above. Chronic changes of bilateral sacroiliitis. No new suspicious bone lesion. IMPRESSION: 1. Findings compatible with favorable response to treatment with interval decrease in size of bilateral renal masses, bilateral adrenal gland masses, and decreasing retroperitoneal adenopathy. There is also a significant interval decrease in size and number of bilateral pulmonary nodules and left sided pleural based masses within the visualized lung bases. 2. Partially visualized expansile mass of the lateral aspect of the left chest wall with destructive changes of the left fifth rib, incompletely characterized at  the edge of the field of view. 3. Mild right hydronephrosis. 4. Aortic atherosclerosis (ICD10-I70.0). Electronically Signed   By: Davina Poke D.O.   On: 06/19/2021 07:47   DG HIPS BILAT WITH PELVIS 3-4 VIEWS  Result Date: 06/21/2021 CLINICAL DATA:  Bilateral hip pain for 2-3 days EXAM: DG HIP (WITH OR WITHOUT PELVIS) 3-4V BILAT COMPARISON:  None. FINDINGS: Frontal view the pelvis as well as  frontal and frogleg lateral views of both hips are obtained. No fracture, subluxation, or dislocation within either hip. There is mild symmetrical osteoarthritis. Remainder of the bony pelvis is unremarkable. Sacroiliac joints are normal. IMPRESSION: 1. Mild symmetrical bilateral hip osteoarthritis. No acute bony abnormalities. Electronically Signed   By: Randa Ngo M.D.   On: 06/21/2021 15:04     Subjective: Seen and examined at bedside and patient is doing much better today from abdominal standpoint however he is complaining of significant bilateral hip pain on the proximal thigh is near his hips.  He denies chest pain or shortness.  After given some analgesics his hip pain improved.  He ambulated well with PT and OT and recommended no follow-up.  He was deemed medically stable to be discharged home and follow-up with outpatient PCP, medical oncology and orthopedic surgery in outpatient setting.  Discharge Exam: Vitals:   06/21/21 0353 06/21/21 1300  BP: 140/90 (!) 152/98  Pulse: 95 (!) 101  Resp: 20 20  Temp: 99 F (37.2 C) 100.3 F (37.9 C)  SpO2: 99% 99%   Vitals:   06/20/21 1252 06/20/21 2018 06/21/21 0353 06/21/21 1300  BP: (!) 149/93 (!) 155/97 140/90 (!) 152/98  Pulse: 86 84 95 (!) 101  Resp:  20 20 20   Temp: 97.9 F (36.6 C) 98.2 F (36.8 C) 99 F (37.2 C) 100.3 F (37.9 C)  TempSrc: Oral Oral Oral Oral  SpO2: 98% 97% 99% 99%  Weight:      Height:       General: Pt is alert, awake, not in acute distress Cardiovascular: RRR, S1/S2 +, no rubs, no gallops Respiratory:  Diminished bilaterally, no wheezing, no rhonchi; unlabored breathing Abdominal: Soft, not tender to palpate, distended secondary habitus, bowel sounds + Extremities: no edema, no cyanosis  The results of significant diagnostics from this hospitalization (including imaging, microbiology, ancillary and laboratory) are listed below for reference.    Microbiology: Recent Results (from the past 240 hour(s))  Resp Panel by RT-PCR (Flu A&B, Covid) Nasopharyngeal Swab     Status: None   Collection Time: 06/19/21  8:18 AM   Specimen: Nasopharyngeal Swab; Nasopharyngeal(NP) swabs in vial transport medium  Result Value Ref Range Status   SARS Coronavirus 2 by RT PCR NEGATIVE NEGATIVE Final    Comment: (NOTE) SARS-CoV-2 target nucleic acids are NOT DETECTED.  The SARS-CoV-2 RNA is generally detectable in upper respiratory specimens during the acute phase of infection. The lowest concentration of SARS-CoV-2 viral copies this assay can detect is 138 copies/mL. A negative result does not preclude SARS-Cov-2 infection and should not be used as the sole basis for treatment or other patient management decisions. A negative result may occur with  improper specimen collection/handling, submission of specimen other than nasopharyngeal swab, presence of viral mutation(s) within the areas targeted by this assay, and inadequate number of viral copies(<138 copies/mL). A negative result must be combined with clinical observations, patient history, and epidemiological information. The expected result is Negative.  Fact Sheet for Patients:  EntrepreneurPulse.com.au  Fact Sheet for Healthcare Providers:  IncredibleEmployment.be  This test is no t yet approved or cleared by the [place] FDA and  has been authorized for detection and/or diagnosis of SARS-CoV-2 by FDA under an Emergency Use Authorization (EUA). This EUA will remain  in effect (meaning this test can be  used) for the duration of the COVID-19 declaration under Section 564(b)(1) of the Act, 21 U.S.C.section 360bbb-3(b)(1), unless the authorization is terminated  or revoked sooner.  Influenza A by PCR NEGATIVE NEGATIVE Final   Influenza B by PCR NEGATIVE NEGATIVE Final    Comment: (NOTE) The Xpert Xpress SARS-CoV-2/FLU/RSV plus assay is intended as an aid in the diagnosis of influenza from Nasopharyngeal swab specimens and should not be used as a sole basis for treatment. Nasal washings and aspirates are unacceptable for Xpert Xpress SARS-CoV-2/FLU/RSV testing.  Fact Sheet for Patients: EntrepreneurPulse.com.au  Fact Sheet for Healthcare Providers: IncredibleEmployment.be  This test is not yet approved or cleared by the [place] FDA and has been authorized for detection and/or diagnosis of SARS-CoV-2 by FDA under an Emergency Use Authorization (EUA). This EUA will remain in effect (meaning this test can be used) for the duration of the COVID-19 declaration under Section 564(b)(1) of the Act, 21 U.S.C. section 360bbb-3(b)(1), unless the authorization is terminated or revoked.  Performed at [facility], [place] [address] [place], [place] [address]      Labs: BNP (last 3 results) No results for input(s): BNP in the last 8760 hours. Basic Metabolic Panel: Recent Labs  Lab 06/18/21 2240 06/20/21 0528 06/21/21 0519  NA 134* 135 135  K 4.2 3.9 3.8  CL 102 105 105  CO2 24 21* 22  GLUCOSE 103* 72 89  BUN 11 6 <5*  CREATININE 0.59* 0.58* 0.54*  CALCIUM 9.7 8.3* 8.1*  MG  --   --  1.5*  PHOS  --   --  1.9*   Liver Function Tests: Recent Labs  Lab 06/18/21 2240 06/20/21 0528 06/21/21 0519  AST 24 19 16   ALT 28 21 22   ALKPHOS 110 90 90  BILITOT 0.7 0.7 0.7  PROT 8.9* 6.9 7.2  ALBUMIN 3.9 3.1* 3.1*   Recent Labs  Lab 06/18/21 2240 06/20/21 0528 06/21/21 0519  LIPASE 297* 168* 268*   No results  for input(s): AMMONIA in the last 168 hours. CBC: Recent Labs  Lab 06/18/21 2240 06/20/21 0528 06/21/21 0519  WBC 4.3 3.4* 3.2*  NEUTROABS 2.4  --  1.7  HGB 15.8 13.1 13.8  HCT 50.8 43.2 44.8  MCV 71.5* 73.1* 72.8*  PLT 257 284 238   Cardiac Enzymes: No results for input(s): CKTOTAL, CKMB, CKMBINDEX, TROPONINI in the last 168 hours. BNP: Invalid input(s): POCBNP CBG: No results for input(s): GLUCAP in the last 168 hours. D-Dimer No results for input(s): DDIMER in the last 72 hours. Hgb A1c No results for input(s): HGBA1C in the last 72 hours. Lipid Profile No results for input(s): CHOL, HDL, LDLCALC, TRIG, CHOLHDL, LDLDIRECT in the last 72 hours. Thyroid function studies No results for input(s): TSH, T4TOTAL, T3FREE, THYROIDAB in the last 72 hours.  Invalid input(s): FREET3 Anemia work up No results for input(s): VITAMINB12, FOLATE, FERRITIN, TIBC, IRON, RETICCTPCT in the last 72 hours. Urinalysis    Component Value Date/Time   COLORURINE YELLOW 06/19/2021 0515   APPEARANCEUR HAZY (A) 06/19/2021 0515   LABSPEC 1.020 06/19/2021 0515   PHURINE 5.0 06/19/2021 0515   GLUCOSEU NEGATIVE 06/19/2021 0515   HGBUR MODERATE (A) 06/19/2021 0515   BILIRUBINUR NEGATIVE 06/19/2021 0515   KETONESUR 5 (A) 06/19/2021 0515   PROTEINUR 30 (A) 06/19/2021 0515   NITRITE NEGATIVE 06/19/2021 0515   LEUKOCYTESUR TRACE (A) 06/19/2021 0515   Sepsis Labs Invalid input(s): PROCALCITONIN,  WBC,  LACTICIDVEN Microbiology Recent Results (from the past 240 hour(s))  Resp Panel by RT-PCR (Flu A&B, Covid) Nasopharyngeal Swab     Status: None   Collection Time: 06/19/21  8:18 AM  Specimen: Nasopharyngeal Swab; Nasopharyngeal(NP) swabs in vial transport medium  Result Value Ref Range Status   SARS Coronavirus 2 by RT PCR NEGATIVE NEGATIVE Final    Comment: (NOTE) SARS-CoV-2 target nucleic acids are NOT DETECTED.  The SARS-CoV-2 RNA is generally detectable in upper respiratory specimens during  the acute phase of infection. The lowest concentration of SARS-CoV-2 viral copies this assay can detect is 138 copies/mL. A negative result does not preclude SARS-Cov-2 infection and should not be used as the sole basis for treatment or other patient management decisions. A negative result may occur with  improper specimen collection/handling, submission of specimen other than nasopharyngeal swab, presence of viral mutation(s) within the areas targeted by this assay, and inadequate number of viral copies(<138 copies/mL). A negative result must be combined with clinical observations, patient history, and epidemiological information. The expected result is Negative.  Fact Sheet for Patients:  EntrepreneurPulse.com.au  Fact Sheet for Healthcare Providers:  IncredibleEmployment.be  This test is no t yet approved or cleared by the [place] FDA and  has been authorized for detection and/or diagnosis of SARS-CoV-2 by FDA under an Emergency Use Authorization (EUA). This EUA will remain  in effect (meaning this test can be used) for the duration of the COVID-19 declaration under Section 564(b)(1) of the Act, 21 U.S.C.section 360bbb-3(b)(1), unless the authorization is terminated  or revoked sooner.       Influenza A by PCR NEGATIVE NEGATIVE Final   Influenza B by PCR NEGATIVE NEGATIVE Final    Comment: (NOTE) The Xpert Xpress SARS-CoV-2/FLU/RSV plus assay is intended as an aid in the diagnosis of influenza from Nasopharyngeal swab specimens and should not be used as a sole basis for treatment. Nasal washings and aspirates are unacceptable for Xpert Xpress SARS-CoV-2/FLU/RSV testing.  Fact Sheet for Patients: EntrepreneurPulse.com.au  Fact Sheet for Healthcare Providers: IncredibleEmployment.be  This test is not yet approved or cleared by the [place] FDA and has been authorized for detection and/or  diagnosis of SARS-CoV-2 by FDA under an Emergency Use Authorization (EUA). This EUA will remain in effect (meaning this test can be used) for the duration of the COVID-19 declaration under Section 564(b)(1) of the Act, 21 U.S.C. section 360bbb-3(b)(1), unless the authorization is terminated or revoked.  Performed at [facility], [place] [address] [place], [place] [address]     Time coordinating discharge: 35 minutes  SIGNED:  Kerney Elbe, DO [facility] 06/21/2021, 7:42 PM Pager is on [place]  If 7PM-7AM, please contact night-coverage [email or web address]

## 2021-06-26 DIAGNOSIS — Z85118 Personal history of other malignant neoplasm of bronchus and lung: Secondary | ICD-10-CM | POA: Insufficient documentation

## 2021-06-26 DIAGNOSIS — Z20822 Contact with and (suspected) exposure to covid-19: Secondary | ICD-10-CM | POA: Insufficient documentation

## 2021-06-26 DIAGNOSIS — K853 Drug induced acute pancreatitis without necrosis or infection: Secondary | ICD-10-CM | POA: Insufficient documentation

## 2021-06-26 DIAGNOSIS — Z85841 Personal history of malignant neoplasm of brain: Secondary | ICD-10-CM | POA: Diagnosis not present

## 2021-06-26 DIAGNOSIS — Z85528 Personal history of other malignant neoplasm of kidney: Secondary | ICD-10-CM | POA: Diagnosis not present

## 2021-06-26 DIAGNOSIS — F17210 Nicotine dependence, cigarettes, uncomplicated: Secondary | ICD-10-CM | POA: Diagnosis not present

## 2021-06-26 DIAGNOSIS — R1013 Epigastric pain: Secondary | ICD-10-CM | POA: Diagnosis present

## 2021-06-27 ENCOUNTER — Encounter (HOSPITAL_COMMUNITY): Payer: Self-pay

## 2021-06-27 ENCOUNTER — Other Ambulatory Visit: Payer: Self-pay | Admitting: Physician Assistant

## 2021-06-27 ENCOUNTER — Other Ambulatory Visit: Payer: Self-pay

## 2021-06-27 ENCOUNTER — Emergency Department (HOSPITAL_COMMUNITY)
Admission: EM | Admit: 2021-06-27 | Discharge: 2021-06-27 | Disposition: A | Discharge status: Home / Self Care | Payer: Medicaid Other | Attending: Emergency Medicine | Admitting: Emergency Medicine

## 2021-06-27 DIAGNOSIS — K853 Drug induced acute pancreatitis without necrosis or infection: Secondary | ICD-10-CM

## 2021-06-27 DIAGNOSIS — I10 Essential (primary) hypertension: Secondary | ICD-10-CM

## 2021-06-27 LAB — COMPREHENSIVE METABOLIC PANEL
ALT: 17 U/L (ref 0–44)
AST: 16 U/L (ref 15–41)
Albumin: 3.6 g/dL (ref 3.5–5.0)
Alkaline Phosphatase: 94 U/L (ref 38–126)
Anion gap: 8 (ref 5–15)
BUN: 7 mg/dL (ref 6–20)
CO2: 23 mmol/L (ref 22–32)
Calcium: 8.8 mg/dL — ABNORMAL LOW (ref 8.9–10.3)
Chloride: 103 mmol/L (ref 98–111)
Creatinine, Ser: 0.52 mg/dL — ABNORMAL LOW (ref 0.61–1.24)
GFR, Estimated: >60 mL/min (ref >60)
Glucose, Bld: 98 mg/dL (ref 70–99)
Potassium: 3.9 mmol/L (ref 3.5–5.1)
Sodium: 134 mmol/L — ABNORMAL LOW (ref 135–145)
Total Bilirubin: 0.7 mg/dL (ref 0.3–1.2)
Total Protein: 8.1 g/dL (ref 6.5–8.1)

## 2021-06-27 LAB — CBC WITH DIFFERENTIAL/PLATELET
Abs Immature Granulocytes: 0.03 10*3/uL (ref 0.00–0.07)
Basophils Absolute: 0 10*3/uL (ref 0.0–0.1)
Basophils Relative: 1 %
Eosinophils Absolute: 0.3 10*3/uL (ref 0.0–0.5)
Eosinophils Relative: 6 %
HCT: 45.9 % (ref 39.0–52.0)
Hemoglobin: 14.2 g/dL (ref 13.0–17.0)
Immature Granulocytes: 1 %
Lymphocytes Relative: 27 %
Lymphs Abs: 1.2 10*3/uL (ref 0.7–4.0)
MCH: 22.6 pg — ABNORMAL LOW (ref 26.0–34.0)
MCHC: 30.9 g/dL (ref 30.0–36.0)
MCV: 73 fL — ABNORMAL LOW (ref 80.0–100.0)
Monocytes Absolute: 0.5 10*3/uL (ref 0.1–1.0)
Monocytes Relative: 10 %
Neutro Abs: 2.5 10*3/uL (ref 1.7–7.7)
Neutrophils Relative %: 55 %
Platelets: 294 10*3/uL (ref 150–400)
RBC: 6.29 MIL/uL — ABNORMAL HIGH (ref 4.22–5.81)
RDW: 22.7 % — ABNORMAL HIGH (ref 11.5–15.5)
WBC: 4.6 10*3/uL (ref 4.0–10.5)
nRBC: 0 % (ref 0.0–0.2)

## 2021-06-27 LAB — RESP PANEL BY RT-PCR (FLU A&B, COVID) ARPGX2
Influenza A by PCR: NEGATIVE
Influenza B by PCR: NEGATIVE
SARS Coronavirus 2 by RT PCR: NEGATIVE

## 2021-06-27 LAB — LIPASE, BLOOD: Lipase: 528 U/L — ABNORMAL HIGH (ref 11–51)

## 2021-06-27 MED ORDER — OXYCODONE HCL 5 MG PO TABS
2.5000 mg | ORAL_TABLET | Freq: Four times a day (QID) | ORAL | 0 refills | Status: DC | PRN
Start: 2021-06-27 — End: 2021-07-06

## 2021-06-27 MED ORDER — SODIUM CHLORIDE 0.9 % IV BOLUS
1000.0000 mL | Freq: Once | INTRAVENOUS | Status: AC
  Administered 2021-06-27: 02:00:00 1000 mL via INTRAVENOUS

## 2021-06-27 MED ORDER — ONDANSETRON 4 MG PO TBDP: 4.0000 mg | ORAL_TABLET | Freq: Three times a day (TID) | ORAL | 0 refills | Status: DC | PRN

## 2021-06-27 MED ORDER — HYDROMORPHONE HCL 1 MG/ML IJ SOLN
1.0000 mg | Freq: Once | INTRAMUSCULAR | Status: AC
  Administered 2021-06-27: 02:00:00 1 mg via INTRAVENOUS
  Filled 2021-06-27: qty 1

## 2021-06-27 MED ORDER — OXYCODONE HCL 5 MG PO TABS
5.0000 mg | ORAL_TABLET | Freq: Once | ORAL | Status: AC
  Administered 2021-06-27: 03:00:00 5 mg via ORAL
  Filled 2021-06-27: qty 1

## 2021-06-27 NOTE — ED Provider Notes (Signed)
[facility] Provider Note  CSN: [id number] Arrival date & time: 06/26/21 2359  Chief Complaint(s) Abdominal Pain  HPI [facility] is a 50 y.o. male h/o [facility] on Ab regimen. Here with epigastric abd pain similar to recent medicine related pancreatitis.   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 Authorizing Provider  ondansetron (ZOFRAN-ODT) 4 MG disintegrating tablet Take 1 tablet (4 mg total) by mouth every 8 (eight) hours as needed for up to 3 days for nausea or vomiting. 06/27/21 06/30/21 Yes Lineth Thielke, Grayce Sessions, MD  oxyCODONE (ROXICODONE) 5 MG immediate release tablet Take 0.5-1 tablets (2.5-5 mg total) by mouth every 6 (six) hours as needed for up to 5 days for severe pain. 06/27/21 07/02/21 Yes Basilio Meadow, Grayce Sessions, MD  acetaminophen (TYLENOL) 500 MG tablet Take 1,000 mg by mouth every 6 (six) hours as needed (pain).    [provider]  amLODipine (NORVASC) 5 MG tablet Take 1 tablet  (5 mg total) by mouth daily. 06/05/21   Heilingoetter, Cassandra L, PA-C  diclofenac Sodium (VOLTAREN) 1 % GEL Apply 2 g topically 4 (four) times daily. 06/21/21   Raiford Noble Latif, DO  docusate sodium (COLACE) 100 MG capsule Take 1 capsule (100 mg total) by mouth 2 (two) times daily. 06/21/21   Sheikh, Omair Latif, DO  ondansetron (ZOFRAN) 4 MG tablet Take 1 tablet (4 mg total) by mouth every 6 (six) hours as needed for nausea. 06/21/21   Raiford Noble Latif, DO  pantoprazole (PROTONIX) 40 MG tablet Take 1 tablet (40 mg total) by mouth daily. 06/21/21   Sheikh, Georgina Quint Latif, DO  polyethylene glycol (MIRALAX / GLYCOLAX) 17 g packet Take 17 g by mouth daily. 06/21/21   Kerney Elbe, DO                                                                                                                                    Past Surgical History Past  Surgical History:  Procedure Laterality Date   APPENDECTOMY     CHEST TUBE INSERTION Left 02/06/2021   Procedure: INSERTION Dylan DRAINAGE CATHETER;  Surgeon: Garner Nash, DO;  Location: [facility];  Service: Pulmonary;  Laterality: Left;   CHEST TUBE INSERTION N/A 04/04/2021   Procedure: removal of pleurex cath;  Surgeon: Garner Nash, DO;  Location: [facility];  Service: Pulmonary;  Laterality: N/A;   Family History History reviewed. No pertinent family history.  Social History Social History   Tobacco Use   Smoking status: Some Days    Packs/day: 0.50    Types: Cigarettes   Smokeless tobacco: Never  Vaping Use   Vaping Use: Never used  Substance Use Topics   Alcohol use: Never   Drug use: Never   Allergies Patient has no known allergies.  Review of Systems Review of Systems  Constitutional:  Negative for fatigue and fever.  Respiratory:  Negative for cough and shortness of breath.   Cardiovascular:  Negative for chest pain.  Gastrointestinal:  Positive for abdominal pain. Negative for constipation, diarrhea, nausea  and vomiting.  Genitourinary:  Negative for dysuria.  All other systems are reviewed and are negative for acute change except as noted in the HPI  Physical Exam Vital Signs  I have reviewed the triage vital signs BP 125/85    Pulse 72    Temp 98.5 F (36.9 C) (Oral)    Resp 18    Ht 5\' 9"  (1.753 m)    Wt 78.9 kg    SpO2 99%    BMI 25.70 kg/m   Physical Exam Vitals reviewed.  Constitutional:      General: He is not in acute distress.    Appearance: He is well-developed. He is not diaphoretic.  HENT:     Head: Normocephalic and atraumatic.     Right Ear: External ear normal.     Left Ear: External ear normal.     Nose: Nose normal.     Mouth/Throat:     Mouth: Mucous membranes are moist.  Eyes:     General: No scleral icterus.    Conjunctiva/sclera: Conjunctivae normal.  Neck:     Trachea: Phonation normal.  Cardiovascular:     Rate and Rhythm: Normal rate and regular rhythm.  Pulmonary:     Effort: Pulmonary effort is normal. No respiratory distress.     Breath sounds: No stridor.  Abdominal:     General: There is no distension.     Tenderness: There is abdominal tenderness in the epigastric area. There is no guarding or rebound.     Hernia: No hernia is present.  Musculoskeletal:        General: Normal range of motion.     Cervical back: Normal range of motion.  Neurological:     Mental Status: He is alert and oriented to person, place, and time.  Psychiatric:        Behavior: Behavior normal.    ED Results and Treatments Labs (all labs ordered are listed, but only abnormal results are displayed) Labs Reviewed  CBC WITH DIFFERENTIAL/PLATELET - Abnormal; Notable for the following components:      Result Value   RBC 6.29 (*)    MCV 73.0 (*)    MCH 22.6 (*)    RDW 22.7 (*)    All other components within normal limits  COMPREHENSIVE METABOLIC PANEL - Abnormal; Notable for the following components:   Sodium 134 (*)    Creatinine, Ser 0.52 (*)  Calcium 8.8 (*)     All other components within normal limits  LIPASE, BLOOD - Abnormal; Notable for the following components:   Lipase 528 (*)    All other components within normal limits  RESP PANEL BY RT-PCR (FLU A&B, COVID) [id number]                                                                                                                         EKG  EKG Interpretation  Date/Time:    Ventricular Rate:    PR Interval:    QRS Duration:   QT Interval:    QTC Calculation:   R Axis:     Text Interpretation:         Radiology No results found.  Pertinent labs & imaging results that were available during my care of the patient were reviewed by me and considered in my medical decision making (see MDM for details).  Medications Ordered in ED Medications  sodium chloride 0.9 % bolus 1,000 mL (0 mLs Intravenous Stopped 06/27/21 0256)  HYDROmorphone (DILAUDID) injection 1 mg (1 mg Intravenous Given 06/27/21 0153)  oxyCODONE (Oxy IR/ROXICODONE) immediate release tablet 5 mg (5 mg Oral Given 06/27/21 0255)                                                                                                                                     Procedures Procedures  (including critical care time)  Medical Decision Making / ED Course I have reviewed the nursing notes for this encounter and the patient's prior records (if available in EHR or on provided paperwork).  DEVION CHRISCOE was evaluated in Emergency Department on 06/27/2021 for the symptoms described in the history of present illness. He was evaluated in the context of the global COVID-19 pandemic, which necessitated consideration that the patient might be at risk for infection with the SARS-CoV-2 virus that causes COVID-19. Institutional protocols and algorithms that pertain to the evaluation of patients at risk for COVID-19 are in a state of rapid change based on information released by regulatory bodies including the CDC and federal and state  organizations. These policies and algorithms were followed during the patient's care in the ED.     Patient's work-up is consistent with pancreatitis.  It appears the patient ran out of his pain medication at home. Pain was able to be controlled with a single dose of Dilaudid  and oral pain medicine.  He has been able to tolerate oral intake here.    Patient given the option of admission versus DC home with pain control medication.  With shared decision making, patient opted to be discharged home.  Strict return precautions were given.    Pertinent labs & imaging results that were available during my care of the patient were reviewed by me and considered in my medical decision making:    Final Clinical Impression(s) / ED Diagnoses Final diagnoses:  Drug-induced acute pancreatitis without infection or necrosis   The patient appears reasonably screened and/or stabilized for discharge and I doubt any other medical condition or other [facility] requiring further screening, evaluation, or treatment in the ED at this time prior to discharge. Safe for discharge with strict return precautions.  Disposition: Discharge  Condition: Good  I have discussed the results, Dx and Tx plan with the patient/family who expressed understanding and agree(s) with the plan. Discharge instructions discussed at length. The patient/family was given strict return precautions who verbalized understanding of the instructions. No further questions at time of discharge.    ED Discharge Orders          Ordered    ondansetron (ZOFRAN-ODT) 4 MG disintegrating tablet  Every 8 hours PRN        06/27/21 0627    oxyCODONE (ROXICODONE) 5 MG immediate release tablet  Every 6 hours PRN        06/27/21 [place] narcotic database reviewed and no active prescriptions noted.   Follow Up: [name], MD [facility] [id number] [phone]  Go to  as scheduled     This  chart was dictated using voice recognition software.  Despite best efforts to proofread,  errors can occur which can change the documentation meaning.    Fatima Blank, MD 06/27/21 [phone]

## 2021-06-27 NOTE — Discharge Instructions (Signed)
For the next 2 to 3 days please stay on a clear liquid diet.  You may progress to eating soft foods such as broth soups with vegetables and small pieces of meat.  For easier digestion and may be beneficial to blend the soup.  Do this for an additional 2 to 3 days.  Then you may progress to pancreatic diet (attachment provided).  For pain control you may take at 1000 mg of Tylenol every 8 hours scheduled.  In addition you can take 0.5 to 1 tablet of Oxycodone every 6 hours as needed for pain not controlled with the scheduled Tylenol.

## 2021-06-27 NOTE — ED Triage Notes (Signed)
Pt reports with abdominal pain since earlier today. Pt states that it feels like pancreatitis.

## 2021-06-28 ENCOUNTER — Other Ambulatory Visit (HOSPITAL_COMMUNITY): Payer: Self-pay

## 2021-06-30 ENCOUNTER — Encounter (HOSPITAL_COMMUNITY): Payer: Self-pay

## 2021-06-30 ENCOUNTER — Inpatient Hospital Stay (HOSPITAL_COMMUNITY)
Admission: EM | Admit: 2021-06-30 | Discharge: 2021-07-06 | DRG: 439 | Disposition: A | Discharge status: Home / Self Care | Payer: Medicaid Other | Attending: Internal Medicine | Admitting: Internal Medicine

## 2021-06-30 ENCOUNTER — Other Ambulatory Visit: Payer: Self-pay

## 2021-06-30 DIAGNOSIS — R1011 Right upper quadrant pain: Secondary | ICD-10-CM

## 2021-06-30 DIAGNOSIS — Z720 Tobacco use: Secondary | ICD-10-CM | POA: Diagnosis present

## 2021-06-30 DIAGNOSIS — K859 Acute pancreatitis without necrosis or infection, unspecified: Secondary | ICD-10-CM | POA: Diagnosis present

## 2021-06-30 DIAGNOSIS — I10 Essential (primary) hypertension: Secondary | ICD-10-CM | POA: Diagnosis present

## 2021-06-30 DIAGNOSIS — C782 Secondary malignant neoplasm of pleura: Secondary | ICD-10-CM | POA: Diagnosis present

## 2021-06-30 DIAGNOSIS — C7972 Secondary malignant neoplasm of left adrenal gland: Secondary | ICD-10-CM | POA: Diagnosis present

## 2021-06-30 DIAGNOSIS — Z923 Personal history of irradiation: Secondary | ICD-10-CM

## 2021-06-30 DIAGNOSIS — C649 Malignant neoplasm of unspecified kidney, except renal pelvis: Secondary | ICD-10-CM | POA: Diagnosis present

## 2021-06-30 DIAGNOSIS — C7971 Secondary malignant neoplasm of right adrenal gland: Secondary | ICD-10-CM | POA: Diagnosis present

## 2021-06-30 DIAGNOSIS — Z9049 Acquired absence of other specified parts of digestive tract: Secondary | ICD-10-CM

## 2021-06-30 DIAGNOSIS — R918 Other nonspecific abnormal finding of lung field: Secondary | ICD-10-CM | POA: Diagnosis present

## 2021-06-30 DIAGNOSIS — R17 Unspecified jaundice: Secondary | ICD-10-CM | POA: Diagnosis present

## 2021-06-30 DIAGNOSIS — D72819 Decreased white blood cell count, unspecified: Secondary | ICD-10-CM | POA: Diagnosis present

## 2021-06-30 DIAGNOSIS — Z6825 Body mass index (BMI) 25.0-25.9, adult: Secondary | ICD-10-CM

## 2021-06-30 DIAGNOSIS — C642 Malignant neoplasm of left kidney, except renal pelvis: Secondary | ICD-10-CM | POA: Diagnosis present

## 2021-06-30 DIAGNOSIS — D638 Anemia in other chronic diseases classified elsewhere: Secondary | ICD-10-CM | POA: Diagnosis present

## 2021-06-30 DIAGNOSIS — Z9221 Personal history of antineoplastic chemotherapy: Secondary | ICD-10-CM

## 2021-06-30 DIAGNOSIS — Z79899 Other long term (current) drug therapy: Secondary | ICD-10-CM

## 2021-06-30 DIAGNOSIS — F17210 Nicotine dependence, cigarettes, uncomplicated: Secondary | ICD-10-CM | POA: Diagnosis present

## 2021-06-30 DIAGNOSIS — I700 Atherosclerosis of aorta: Secondary | ICD-10-CM | POA: Diagnosis present

## 2021-06-30 DIAGNOSIS — G893 Neoplasm related pain (acute) (chronic): Secondary | ICD-10-CM | POA: Diagnosis present

## 2021-06-30 DIAGNOSIS — C7931 Secondary malignant neoplasm of brain: Secondary | ICD-10-CM | POA: Diagnosis present

## 2021-06-30 DIAGNOSIS — Z716 Tobacco abuse counseling: Secondary | ICD-10-CM

## 2021-06-30 DIAGNOSIS — R0600 Dyspnea, unspecified: Secondary | ICD-10-CM | POA: Diagnosis present

## 2021-06-30 DIAGNOSIS — E663 Overweight: Secondary | ICD-10-CM | POA: Diagnosis present

## 2021-06-30 DIAGNOSIS — R52 Pain, unspecified: Secondary | ICD-10-CM | POA: Diagnosis present

## 2021-06-30 DIAGNOSIS — Z20822 Contact with and (suspected) exposure to covid-19: Secondary | ICD-10-CM | POA: Diagnosis present

## 2021-06-30 NOTE — ED Triage Notes (Signed)
Pt reports with c/o of abd pain that started yesterday. Pt was recently admitted with pancreatitis, as seen here on the 21 and d/c with pain meds. Pt states pain medications are no longer working. Pt denies N/V/D. Pt last BM was 2 days ago, pt is passing gas. Pt denies trouble or pain with urination. Pt is A&Ox4. Pt denies any ETOH used.

## 2021-07-01 ENCOUNTER — Encounter (HOSPITAL_COMMUNITY): Payer: Self-pay | Admitting: Student

## 2021-07-01 ENCOUNTER — Emergency Department (HOSPITAL_COMMUNITY): Payer: Medicaid Other

## 2021-07-01 DIAGNOSIS — R52 Pain, unspecified: Secondary | ICD-10-CM | POA: Diagnosis present

## 2021-07-01 DIAGNOSIS — Z9221 Personal history of antineoplastic chemotherapy: Secondary | ICD-10-CM | POA: Diagnosis not present

## 2021-07-01 DIAGNOSIS — Z6825 Body mass index (BMI) 25.0-25.9, adult: Secondary | ICD-10-CM | POA: Diagnosis not present

## 2021-07-01 DIAGNOSIS — K859 Acute pancreatitis without necrosis or infection, unspecified: Secondary | ICD-10-CM | POA: Diagnosis present

## 2021-07-01 DIAGNOSIS — E663 Overweight: Secondary | ICD-10-CM | POA: Diagnosis present

## 2021-07-01 DIAGNOSIS — Z9049 Acquired absence of other specified parts of digestive tract: Secondary | ICD-10-CM | POA: Diagnosis not present

## 2021-07-01 DIAGNOSIS — C7971 Secondary malignant neoplasm of right adrenal gland: Secondary | ICD-10-CM | POA: Diagnosis present

## 2021-07-01 DIAGNOSIS — Z20822 Contact with and (suspected) exposure to covid-19: Secondary | ICD-10-CM | POA: Diagnosis present

## 2021-07-01 DIAGNOSIS — C642 Malignant neoplasm of left kidney, except renal pelvis: Secondary | ICD-10-CM | POA: Diagnosis present

## 2021-07-01 DIAGNOSIS — C7972 Secondary malignant neoplasm of left adrenal gland: Secondary | ICD-10-CM | POA: Diagnosis present

## 2021-07-01 DIAGNOSIS — Z720 Tobacco use: Secondary | ICD-10-CM | POA: Diagnosis present

## 2021-07-01 DIAGNOSIS — R0600 Dyspnea, unspecified: Secondary | ICD-10-CM | POA: Diagnosis present

## 2021-07-01 DIAGNOSIS — Z716 Tobacco abuse counseling: Secondary | ICD-10-CM | POA: Diagnosis not present

## 2021-07-01 DIAGNOSIS — R918 Other nonspecific abnormal finding of lung field: Secondary | ICD-10-CM | POA: Diagnosis present

## 2021-07-01 DIAGNOSIS — R17 Unspecified jaundice: Secondary | ICD-10-CM | POA: Diagnosis present

## 2021-07-01 DIAGNOSIS — C641 Malignant neoplasm of right kidney, except renal pelvis: Secondary | ICD-10-CM | POA: Diagnosis not present

## 2021-07-01 DIAGNOSIS — G893 Neoplasm related pain (acute) (chronic): Secondary | ICD-10-CM | POA: Diagnosis present

## 2021-07-01 DIAGNOSIS — Z923 Personal history of irradiation: Secondary | ICD-10-CM | POA: Diagnosis not present

## 2021-07-01 DIAGNOSIS — I10 Essential (primary) hypertension: Secondary | ICD-10-CM

## 2021-07-01 DIAGNOSIS — I700 Atherosclerosis of aorta: Secondary | ICD-10-CM | POA: Diagnosis present

## 2021-07-01 DIAGNOSIS — D72819 Decreased white blood cell count, unspecified: Secondary | ICD-10-CM | POA: Diagnosis present

## 2021-07-01 DIAGNOSIS — F17210 Nicotine dependence, cigarettes, uncomplicated: Secondary | ICD-10-CM | POA: Diagnosis present

## 2021-07-01 DIAGNOSIS — Z79899 Other long term (current) drug therapy: Secondary | ICD-10-CM | POA: Diagnosis not present

## 2021-07-01 DIAGNOSIS — D638 Anemia in other chronic diseases classified elsewhere: Secondary | ICD-10-CM | POA: Diagnosis present

## 2021-07-01 DIAGNOSIS — C7931 Secondary malignant neoplasm of brain: Secondary | ICD-10-CM | POA: Diagnosis present

## 2021-07-01 DIAGNOSIS — C782 Secondary malignant neoplasm of pleura: Secondary | ICD-10-CM | POA: Diagnosis present

## 2021-07-01 HISTORY — DX: Essential (primary) hypertension: I10

## 2021-07-01 LAB — CBC WITH DIFFERENTIAL/PLATELET
Abs Immature Granulocytes: 0.01 10*3/uL (ref 0.00–0.07)
Basophils Absolute: 0 10*3/uL (ref 0.0–0.1)
Basophils Relative: 1 %
Eosinophils Absolute: 0.2 10*3/uL (ref 0.0–0.5)
Eosinophils Relative: 5 %
HCT: 45.3 % (ref 39.0–52.0)
Hemoglobin: 14.1 g/dL (ref 13.0–17.0)
Immature Granulocytes: 0 %
Lymphocytes Relative: 32 %
Lymphs Abs: 1.2 10*3/uL (ref 0.7–4.0)
MCH: 22.9 pg — ABNORMAL LOW (ref 26.0–34.0)
MCHC: 31.1 g/dL (ref 30.0–36.0)
MCV: 73.7 fL — ABNORMAL LOW (ref 80.0–100.0)
Monocytes Absolute: 0.4 10*3/uL (ref 0.1–1.0)
Monocytes Relative: 12 %
Neutro Abs: 1.9 10*3/uL (ref 1.7–7.7)
Neutrophils Relative %: 50 %
Platelets: 348 10*3/uL (ref 150–400)
RBC: 6.15 MIL/uL — ABNORMAL HIGH (ref 4.22–5.81)
RDW: 23.1 % — ABNORMAL HIGH (ref 11.5–15.5)
WBC: 3.7 10*3/uL — ABNORMAL LOW (ref 4.0–10.5)
nRBC: 0 % (ref 0.0–0.2)

## 2021-07-01 LAB — COMPREHENSIVE METABOLIC PANEL
ALT: 12 U/L (ref 0–44)
AST: 13 U/L — ABNORMAL LOW (ref 15–41)
Albumin: 3.8 g/dL (ref 3.5–5.0)
Alkaline Phosphatase: 83 U/L (ref 38–126)
Anion gap: 10 (ref 5–15)
BUN: 5 mg/dL — ABNORMAL LOW (ref 6–20)
CO2: 23 mmol/L (ref 22–32)
Calcium: 8.9 mg/dL (ref 8.9–10.3)
Chloride: 101 mmol/L (ref 98–111)
Creatinine, Ser: 0.64 mg/dL (ref 0.61–1.24)
GFR, Estimated: >60 mL/min (ref >60)
Glucose, Bld: 100 mg/dL — ABNORMAL HIGH (ref 70–99)
Potassium: 3.9 mmol/L (ref 3.5–5.1)
Sodium: 134 mmol/L — ABNORMAL LOW (ref 135–145)
Total Bilirubin: 1.3 mg/dL — ABNORMAL HIGH (ref 0.3–1.2)
Total Protein: 8.4 g/dL — ABNORMAL HIGH (ref 6.5–8.1)

## 2021-07-01 LAB — URINALYSIS, ROUTINE W REFLEX MICROSCOPIC
Bilirubin Urine: NEGATIVE
Glucose, UA: NEGATIVE mg/dL
Ketones, ur: 20 mg/dL — AB
Nitrite: NEGATIVE
Protein, ur: 30 mg/dL — AB
RBC / HPF: >50 RBC/hpf — ABNORMAL HIGH (ref 0–5)
Specific Gravity, Urine: 1.01 (ref 1.005–1.030)
pH: 6 (ref 5.0–8.0)

## 2021-07-01 LAB — RESP PANEL BY RT-PCR (FLU A&B, COVID) ARPGX2
Influenza A by PCR: NEGATIVE
Influenza B by PCR: NEGATIVE
SARS Coronavirus 2 by RT PCR: NEGATIVE

## 2021-07-01 LAB — MAGNESIUM: Magnesium: 2 mg/dL (ref 1.7–2.4)

## 2021-07-01 LAB — PHOSPHORUS: Phosphorus: 2.7 mg/dL (ref 2.5–4.6)

## 2021-07-01 LAB — LIPASE, BLOOD: Lipase: 382 U/L — ABNORMAL HIGH (ref 11–51)

## 2021-07-01 IMAGING — CT CT ABD-PELV W/ CM
2 of 5 series · 16 of 46 positions shown, 18 images · IV contrast (omnipaque)
Comparison: [DATE]

CLINICAL DATA: Pancreatitis, persistent.  Abdominal pain

EXAM:
CT ABDOMEN AND PELVIS WITH CONTRAST
TECHNIQUE: Multidetector CT imaging of the abdomen and pelvis was performed
using the standard protocol following bolus administration of
intravenous contrast.
CONTRAST:  80mL OMNIPAQUE IOHEXOL 350 MG/ML SOLN

[Series 2: axial st · axial · 0.79mm/px · z∈[-557,-167]mm · 13 of 90 slices shown, 15 images]
[im 6/90  soft-tissue]
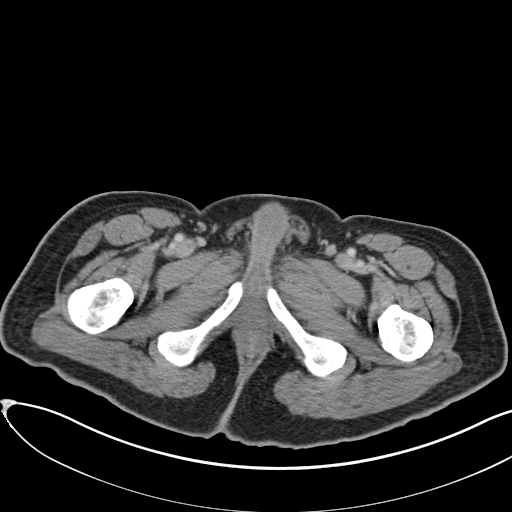
[im 6/90  bone]
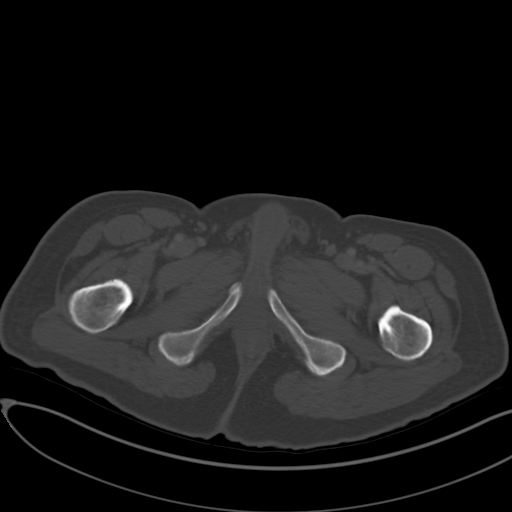
[im 12/90  soft-tissue]
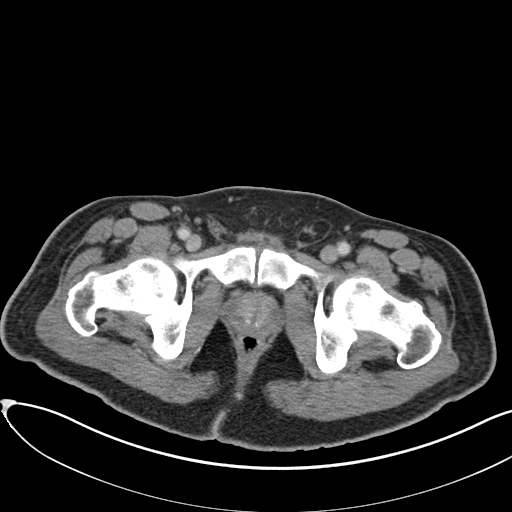
[im 18/90  soft-tissue]
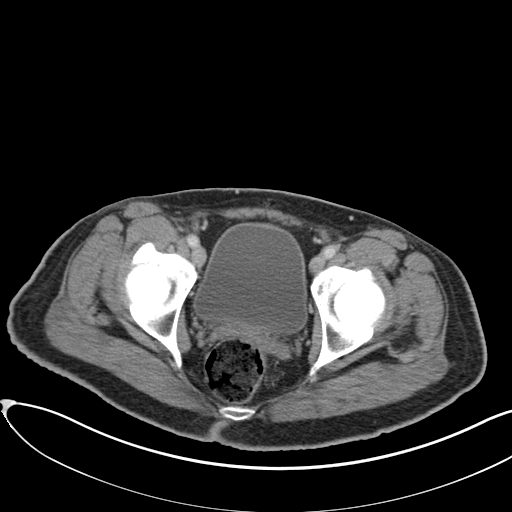
[im 24/90  soft-tissue]
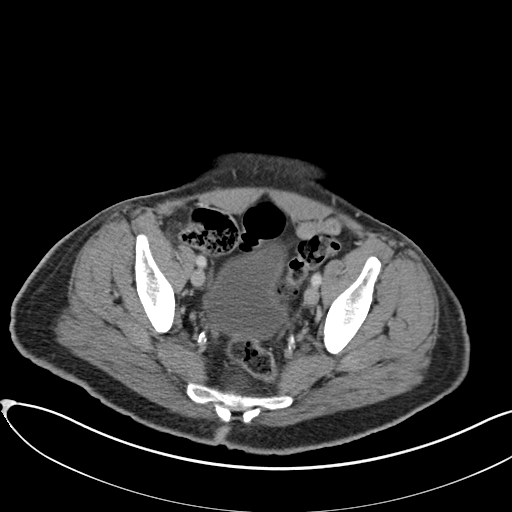
[im 30/90  soft-tissue]
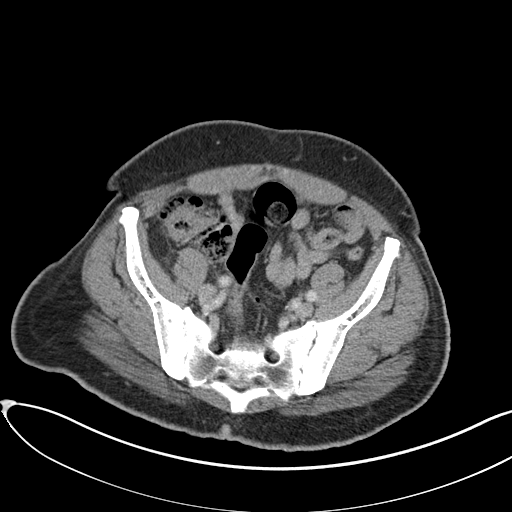
[im 36/90  soft-tissue]
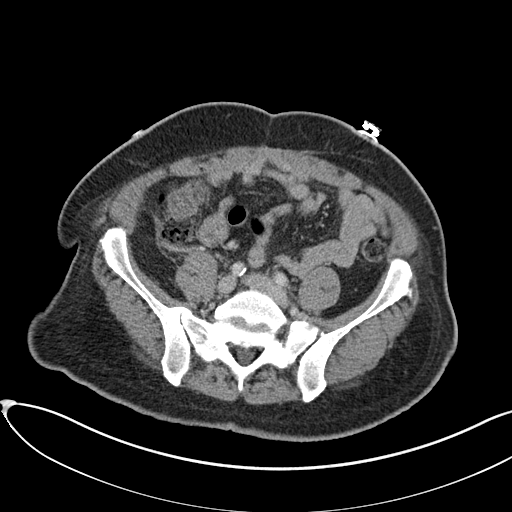
[im 48/90  soft-tissue]
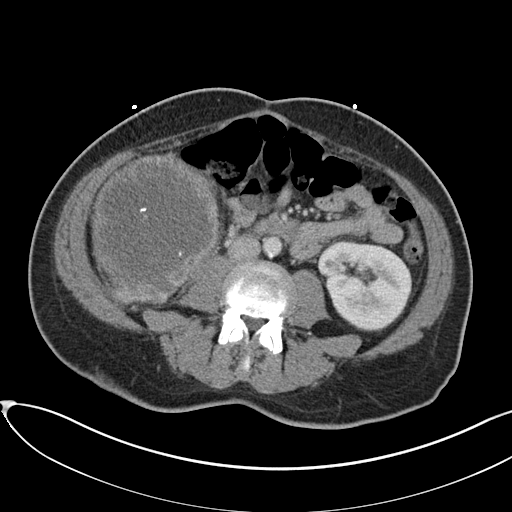
[im 54/90  soft-tissue]
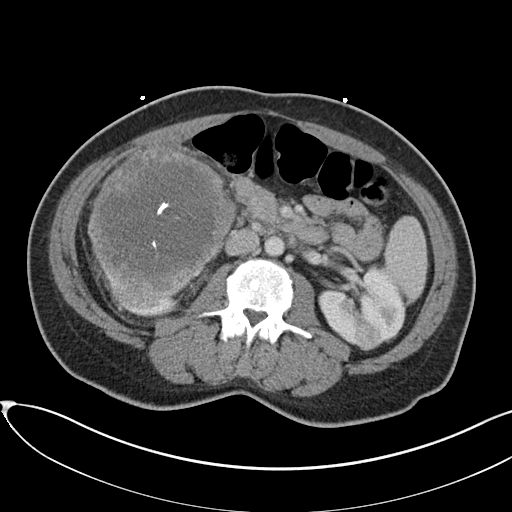
[im 60/90  soft-tissue]
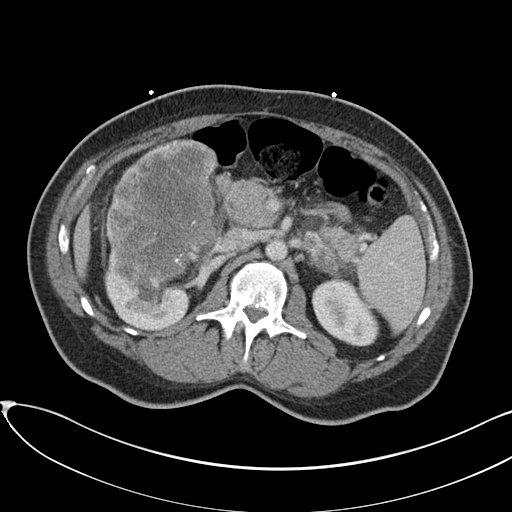
[im 60/90  bone]
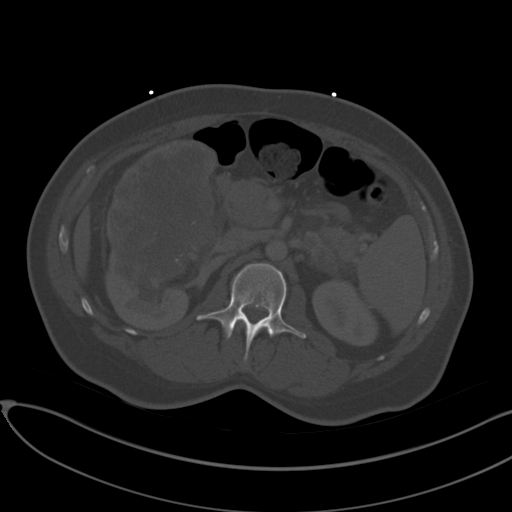
[im 66/90  soft-tissue]
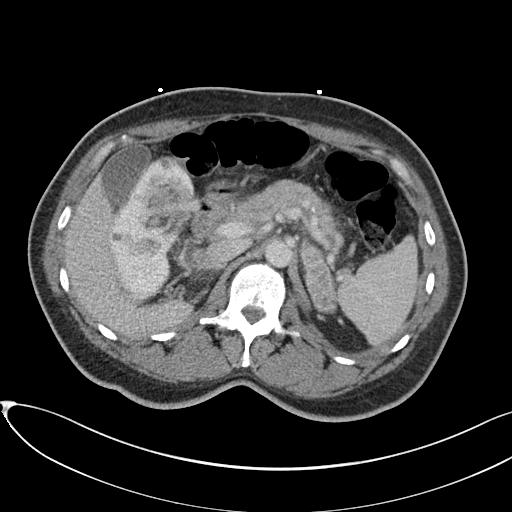
[im 72/90  soft-tissue]
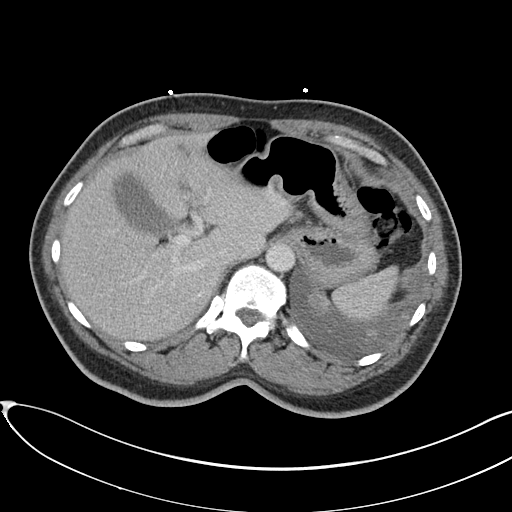
[im 78/90  soft-tissue]
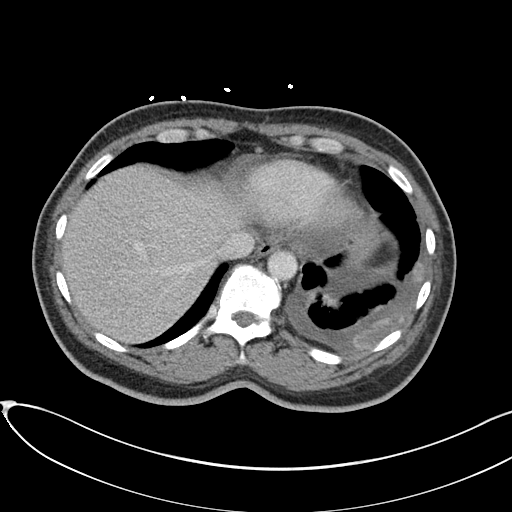
[im 84/90  soft-tissue]
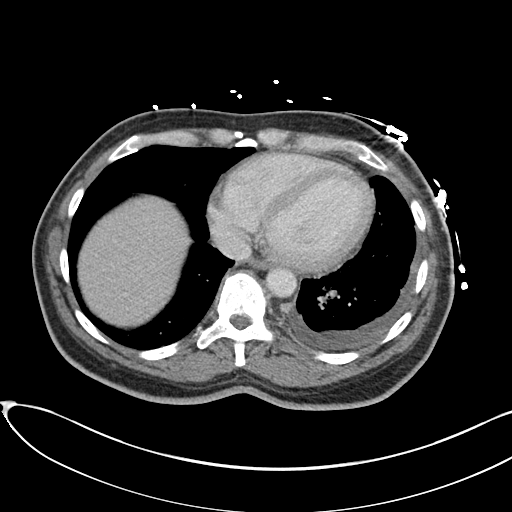

[Series 5: coronal st · coronal · 0.77mm/px · 3 of 156 slices shown]
[im 52/156  soft-tissue]
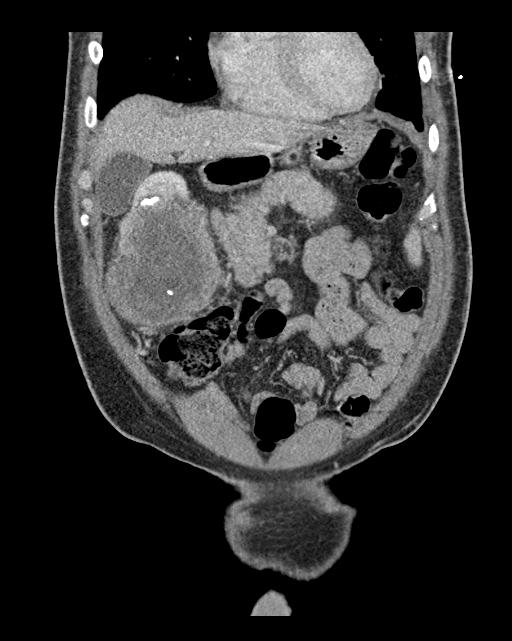
[im 69/156  soft-tissue]
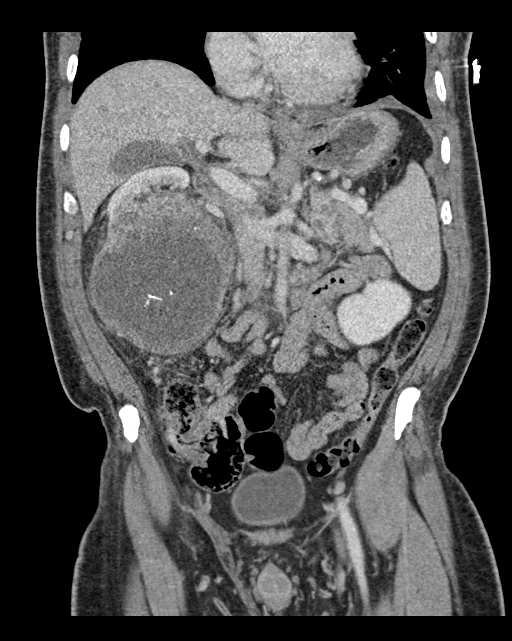
[im 87/156  soft-tissue]
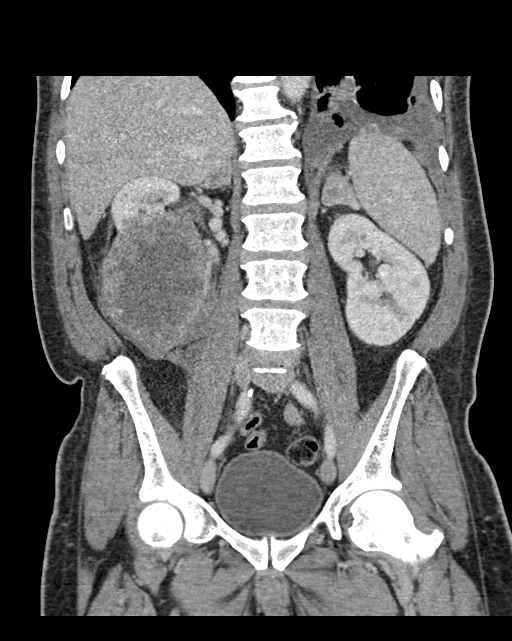

[16 of 46 positions shown; findings below may reference images not displayed]

FINDINGS: Lower chest: Pleural base masses are again seen in the left lower
hemithorax. The superior larger. Largest nodule measures 2.9 cm.
This was not as well visualized on prior study but approximate size
2.6 cm. Small left pleural effusion, slightly increased since prior
study. Spiculated mass in the left lower lobe measures up to 2.3 cm
compared to 2 cm previously. Multiple other lower lobe pulmonary
nodules appear similar to prior study.

Hepatobiliary: No focal hepatic abnormality. Gallbladder
unremarkable.

Pancreas: No focal abnormality or ductal dilatation. No surrounding
inflammation.

Spleen: No focal abnormality.  Normal size.

Adrenals/Urinary Tract: Large heterogeneous partially necrotic mass
again seen arising from the right kidney measures approximately 11 x
11 cm. This does not appear appreciably changed since prior study.
Bilateral adrenal masses and solid left renal mass again noted,
unchanged. No hydronephrosis. Suspect small calcification layering
dependently in the bladder posteriorly. This measures approximately
2 mm.

Stomach/Bowel: Stomach, large and small bowel grossly unremarkable.

Vascular/Lymphatic: No evidence of aneurysm or adenopathy.

Reproductive: No visible focal abnormality.

Other: No free fluid or free air.

Musculoskeletal: No acute bony abnormality.
IMPRESSION: Large right necrotic renal mass again noted, not significantly
changed. Stable solid left renal mass, bilateral adrenal metastases,
and several pulmonary nodules visualized in the lower lungs.
Numerous pleural based lesions in the left lower chest appears
slightly more prominent than prior study with small left pleural
effusion increasing slightly.

2 mm calcification layering dependently in the posterior bladder,
presumably bladder stone. No hydronephrosis.

No acute findings.

## 2021-07-01 MED ORDER — ONDANSETRON HCL 4 MG/2ML IJ SOLN
4.0000 mg | Freq: Once | INTRAMUSCULAR | Status: AC
  Administered 2021-07-01: 03:00:00 4 mg via INTRAVENOUS
  Filled 2021-07-01: qty 2

## 2021-07-01 MED ORDER — SODIUM CHLORIDE 0.9 % IV BOLUS
1000.0000 mL | Freq: Once | INTRAVENOUS | Status: AC
  Administered 2021-07-01: 03:00:00 1000 mL via INTRAVENOUS

## 2021-07-01 MED ORDER — SODIUM CHLORIDE 0.9 % IV BOLUS
1000.0000 mL | Freq: Once | INTRAVENOUS | Status: AC
  Administered 2021-07-01: 04:00:00 1000 mL via INTRAVENOUS

## 2021-07-01 MED ORDER — IOHEXOL 350 MG/ML SOLN
80.0000 mL | Freq: Once | INTRAVENOUS | Status: AC | PRN
  Administered 2021-07-01: 05:00:00 80 mL via INTRAVENOUS

## 2021-07-01 MED ORDER — HYDROMORPHONE HCL 1 MG/ML IJ SOLN
1.0000 mg | Freq: Once | INTRAMUSCULAR | Status: AC
  Administered 2021-07-01: 03:00:00 1 mg via INTRAVENOUS
  Filled 2021-07-01: qty 1

## 2021-07-01 MED ORDER — ONDANSETRON HCL 4 MG/2ML IJ SOLN: 4.0000 mg | Freq: Four times a day (QID) | INTRAMUSCULAR | Status: DC | PRN

## 2021-07-01 MED ORDER — ONDANSETRON HCL 4 MG PO TABS: 4.0000 mg | ORAL_TABLET | Freq: Four times a day (QID) | ORAL | Status: DC | PRN

## 2021-07-01 MED ORDER — PANTOPRAZOLE SODIUM 40 MG IV SOLR
40.0000 mg | Freq: Once | INTRAVENOUS | Status: AC
  Administered 2021-07-01: 10:00:00 40 mg via INTRAVENOUS
  Filled 2021-07-01: qty 40

## 2021-07-01 MED ORDER — HYDROMORPHONE HCL 1 MG/ML IJ SOLN
1.0000 mg | Freq: Once | INTRAMUSCULAR | Status: AC
  Administered 2021-07-01: 04:00:00 1 mg via INTRAVENOUS
  Filled 2021-07-01: qty 1

## 2021-07-01 MED ORDER — HYDROMORPHONE HCL 1 MG/ML IJ SOLN
1.0000 mg | INTRAMUSCULAR | Status: DC | PRN
  Administered 2021-07-01 – 2021-07-04 (×17): 1 mg via INTRAVENOUS
  Filled 2021-07-01 (×17): qty 1

## 2021-07-01 MED ORDER — ACETAMINOPHEN 325 MG PO TABS: 650.0000 mg | ORAL_TABLET | Freq: Four times a day (QID) | ORAL | Status: DC | PRN

## 2021-07-01 MED ORDER — ACETAMINOPHEN 650 MG RE SUPP: 650.0000 mg | Freq: Four times a day (QID) | RECTAL | Status: DC | PRN

## 2021-07-01 MED ORDER — OXYCODONE HCL 5 MG PO TABS
15.0000 mg | ORAL_TABLET | Freq: Once | ORAL | Status: AC
  Administered 2021-07-01: 06:00:00 15 mg via ORAL
  Filled 2021-07-01: qty 3

## 2021-07-01 MED ORDER — AMLODIPINE BESYLATE 5 MG PO TABS
5.0000 mg | ORAL_TABLET | Freq: Every day | ORAL | Status: DC
  Administered 2021-07-01 – 2021-07-06 (×6): 5 mg via ORAL
  Filled 2021-07-01 (×6): qty 1

## 2021-07-01 MED ORDER — LACTATED RINGERS IV SOLN: INTRAVENOUS | Status: DC

## 2021-07-01 MED ORDER — PANTOPRAZOLE SODIUM 40 MG IV SOLR
40.0000 mg | INTRAVENOUS | Status: DC
  Administered 2021-07-02 – 2021-07-06 (×5): 40 mg via INTRAVENOUS
  Filled 2021-07-01 (×5): qty 40

## 2021-07-01 NOTE — ED Notes (Signed)
Called lab to check on urine specimen status. Lab said they lost it. Sending another sample now.

## 2021-07-01 NOTE — H&P (Signed)
History and Physical    Dylan Good VWU:[id number] DOB: 1971-02-26 DOA: 06/30/2021  PCP: Pcp, No  Patient coming from: Home.  I have personally briefly reviewed patient's old medical records in [place]  Chief Complaint: Abdominal pain, nausea and vomiting.  HPI: Dylan Good is a 50 y.o. 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  He has been occasionally mildly nauseous and his appetite has been significantly decreased, but no emesis, diarrhea, constipation, melena or hematochezia.  No flank pain, dysuria, frequency or hematuria.  No fever, chills, sore throat, rhinorrhea, dyspnea, wheezing or hemoptysis.  No chest pain, palpitations, diaphoresis, PND, dyspnea or pitting edema lower extremities.  Denied polyuria, polydipsia, polyphagia or blurred vision.  ED Course: Initial vital signs were temperature 98.9 F, pulse 98, respirations 16, BP 149/105 mmHg O2 sat 98% on room air.  The patient received 2000 mL of NS bolus, hydromorphone 1 mg IVP x2, oxycodone 15 mg p.o. x1 and ondansetron 4 mg IVP x1.  Lab work: CBC showed a white count 3.7, hemoglobin 14.1 g/dL platelets 348.  Urine analysis showed large hemoglobinuria and small leukocyte esterase.  Ketones of 20 and proteinuria 30 mg/dL.  There were more than 50 RBCs, 6-10 WBC and a few bacteria on microscopic examination.  Lipase was 382 units/L.  CMP showed a sodium of 134 mmol/L.  Glucose 100 and total bilirubin 1.3 mg/dL.  Total protein 8.4 g/dL.  The rest of the CMP was normal.  Imaging: CT abdomen/pelvis with contrast showed no focal abnormality or ductal dilatation on the pancreas.  Known renal mass and metastasis were again seen.  Please see images and full radiology  report for further details.  Review of Systems: As per HPI otherwise all other systems reviewed and are negative.  Past Medical History:  Diagnosis Date   Cancer, metastatic to lung [facility])    Hypertension 07/01/2021   Renal cell adenocarcinoma, left [facility])     Past Surgical History:  Procedure Laterality Date   APPENDECTOMY     CHEST TUBE INSERTION Left 02/06/2021   Procedure: INSERTION PLEURAL DRAINAGE CATHETER;  Surgeon: Garner Nash, DO;  Location: [facility];  Service: Pulmonary;  Laterality: Left;   CHEST TUBE INSERTION N/A 04/04/2021   Procedure: removal of pleurex cath;  Surgeon: Garner Nash, DO;  Location: [facility];  Service: Pulmonary;  Laterality: N/A;    Social History  reports that he has been smoking cigarettes. He has been smoking an average of .5 packs per day. He has never used smokeless tobacco. He reports that he does not drink alcohol and does not use drugs.  No Known Allergies  Family medical history Does not know history well. There is some family members with unspecified cancer history.  Prior to Admission medications   Medication Sig Start Date End Date Taking? Authorizing Provider  acetaminophen (TYLENOL) 500 MG tablet Take 1,000 mg by mouth every 6 (six) hours as needed (pain).   Yes [provider]  diclofenac Sodium (VOLTAREN) 1 % GEL Apply 2 g topically 4 (four) times daily. Patient taking differently: Apply 2 g topically daily as needed (for joint pain). 06/21/21  Yes Sheikh, Omair Latif, DO  docusate sodium (COLACE) 100 MG capsule Take 1 capsule (100 mg total)  by mouth 2 (two) times daily. Patient taking differently: Take 100 mg by mouth daily as needed. 06/21/21  Yes Sheikh, Omair Latif, DO  ondansetron (ZOFRAN) 4 MG tablet Take 1 tablet (4 mg total) by mouth every 6 (six) hours as needed for nausea. 06/21/21  Yes Sheikh, Omair Latif, DO  oxyCODONE (ROXICODONE) 5 MG immediate release tablet Take 0.5-1 tablets (2.5-5 mg total) by  mouth every 6 (six) hours as needed for up to 5 days for severe pain. 06/27/21 07/02/21 Yes Cardama, Grayce Sessions, MD  pantoprazole (PROTONIX) 40 MG tablet Take 1 tablet (40 mg total) by mouth daily. Patient taking differently: Take 40 mg by mouth daily as needed (for acid reflux). 06/21/21  Yes Sheikh, Omair Latif, DO  polyethylene glycol (MIRALAX / GLYCOLAX) 17 g packet Take 17 g by mouth daily. Patient taking differently: Take 17 g by mouth daily as needed for mild constipation. 06/21/21  Yes Sheikh, Omair Latif, DO  amLODipine (NORVASC) 5 MG tablet TAKE 1 TABLET (5 MG TOTAL) BY MOUTH DAILY. 06/27/21   Heilingoetter, Tobe Sos, PA-C    Physical Exam: Vitals:   07/01/21 0430 07/01/21 0530 07/01/21 0700 07/01/21 0800  BP: (!) 141/97 134/83 (!) 141/84 (!) 131/93  Pulse: 73 63 68 78  Resp: 11 18 19 18   Temp:      SpO2: 97% 100% 96% 92%  Weight:      Height:       Constitutional: Chronically ill-appearing.  NAD, calm, comfortable Eyes: PERRL, lids and conjunctivae normal ENMT: Mucous membranes are moist. Posterior pharynx clear of any exudate or lesions. Neck: normal, supple, no masses, no thyromegaly Respiratory: clear to auscultation bilaterally, no wheezing, no crackles. Normal respiratory effort. No accessory muscle use.  Cardiovascular: Regular rate and rhythm, no murmurs / rubs / gallops. No extremity edema. 2+ pedal pulses. No carotid bruits.  Abdomen: No distention.  Bowel sounds positive.  Soft, positive epigastric tenderness, no guarding or rebound, no masses palpated. Bowel sounds positive.  Musculoskeletal: no clubbing / cyanosis. No joint deformity upper and lower extremities. Good ROM, no contractures. Normal muscle tone.  Skin: no acute rashes, lesions, ulcers on very limited metabolic examination. Neurologic: CN 2-12 grossly intact. Sensation intact, DTR normal. Strength 5/5 in all 4.  Psychiatric: Normal judgment and insight. Alert and oriented x 3. Normal mood.    Labs on Admission: I have personally reviewed following labs and imaging studies  CBC: Recent Labs  Lab 06/27/21 0014 07/01/21 0213  WBC 4.6 3.7*  NEUTROABS 2.5 1.9  HGB 14.2 14.1  HCT 45.9 45.3  MCV 73.0* 73.7*  PLT 294 875    Basic Metabolic Panel: Recent Labs  Lab 06/27/21 0014 07/01/21 0213  NA 134* 134*  K 3.9 3.9  CL 103 101  CO2 23 23  GLUCOSE 98 100*  BUN 7 5*  CREATININE 0.52* 0.64  CALCIUM 8.8* 8.9    GFR: Estimated Creatinine Clearance: 112.9 mL/min (by C-G formula based on SCr of 0.64 mg/dL).  Liver Function Tests: Recent Labs  Lab 06/27/21 0014 07/01/21 0213  AST 16 13*  ALT 17 12  ALKPHOS 94 83  BILITOT 0.7 1.3*  PROT 8.1 8.4*  ALBUMIN 3.6 3.8    Urine analysis:    Component Value Date/Time   COLORURINE YELLOW 07/01/2021 [place] 07/01/2021 0213   LABSPEC 1.010 07/01/2021 0213   PHURINE 6.0 07/01/2021 0213   GLUCOSEU NEGATIVE 07/01/2021 0213   HGBUR LARGE (A) 07/01/2021 0213   BILIRUBINUR  NEGATIVE 07/01/2021 0213   KETONESUR 20 (A) 07/01/2021 0213   PROTEINUR 30 (A) 07/01/2021 0213   NITRITE NEGATIVE 07/01/2021 0213   LEUKOCYTESUR SMALL (A) 07/01/2021 0213    Radiological Exams on Admission: CT Abdomen Pelvis W Contrast  Result Date: 07/01/2021 CLINICAL DATA:  Pancreatitis, persistent.  Abdominal pain EXAM: CT ABDOMEN AND PELVIS WITH CONTRAST TECHNIQUE: Multidetector CT imaging of the abdomen and pelvis was performed using the standard protocol following bolus administration of intravenous contrast. CONTRAST:  55mL OMNIPAQUE IOHEXOL 350 MG/ML SOLN COMPARISON:  06/19/2021 FINDINGS: Lower chest: Pleural base masses are again seen in the left lower hemithorax. The superior larger. Largest nodule measures 2.9 cm. This was not as well visualized on prior study but approximate size 2.6 cm. Small left pleural effusion, slightly increased since prior study. Spiculated mass in the left lower lobe measures up to 2.3 cm  compared to 2 cm previously. Multiple other lower lobe pulmonary nodules appear similar to prior study. Hepatobiliary: No focal hepatic abnormality. Gallbladder unremarkable. Pancreas: No focal abnormality or ductal dilatation. No surrounding inflammation. Spleen: No focal abnormality.  Normal size. Adrenals/Urinary Tract: Large heterogeneous partially necrotic mass again seen arising from the right kidney measures approximately 11 x 11 cm. This does not appear appreciably changed since prior study. Bilateral adrenal masses and solid left renal mass again noted, unchanged. No hydronephrosis. Suspect small calcification layering dependently in the bladder posteriorly. This measures approximately 2 mm. Stomach/Bowel: Stomach, large and small bowel grossly unremarkable. Vascular/Lymphatic: No evidence of aneurysm or adenopathy. Reproductive: No visible focal abnormality. Other: No free fluid or free air. Musculoskeletal: No acute bony abnormality. IMPRESSION: Large right necrotic renal mass again noted, not significantly changed. Stable solid left renal mass, bilateral adrenal metastases, and several pulmonary nodules visualized in the lower lungs. Numerous pleural based lesions in the left lower chest appears slightly more prominent than prior study with small left pleural effusion increasing slightly. 2 mm calcification layering dependently in the posterior bladder, presumably bladder stone. No hydronephrosis. No acute findings. Electronically Signed   By: Rolm Baptise M.D.   On: 07/01/2021 05:12    EKG: Independently reviewed.  Vent. rate 88 BPM PR interval 144 ms QRS duration 86 ms QT/QTcB 372/451 ms P-R-T axes 63 76 56 Sinus rhythm Normal ECG  Assessment/Plan Principal Problem:   Intractable pain In the setting of persistent   Pancreatitis Observation/MedSurg. Advance diet to clear liquids as tolerated. Continue IV fluids Continue analgesics as needed. Continue antiemetics as  needed. Pantoprazole 40 mg IVP every 24 hours.  Active Problems:   Kidney cancer, primary,  with metastasis from kidney to other site [facility]) Imaging showing increased prominence of pleural-based lesions. The patient is actively following up with hematology oncology.    Brain metastases ([place]) Has MRI brain scheduled in 2 months. Follow-up with oncology.    Hypertension Continue amlodipine 5 mg p.o. daily. Monitor blood pressure and heart rate.    Tobacco use Nicotine replacement therapy as needed ordered.   DVT prophylaxis: SCDs. Code Status:   Full code. Family Communication:   Disposition Plan:   Patient is from:  Home.  Anticipated DC to:  Home.  Anticipated DC date:  07/02/2021 or 07/03/2021.  Anticipated DC barriers: Clinical status.  Consults called:   Admission status:  Observation/MedSurg.    Severity of Illness:High severity in the setting of intractable abdominal pain with nausea and vomiting in the setting of recrudence of recent acute pancreatitis.  Reubin Milan MD [facility]  How  to contact the [facility] Attending or Consulting provider [place] or covering provider during after hours [place], for this patient?   Check the care team in [facility] and look for a) attending/consulting [facility] provider listed and b) the [facility] team listed Log into [email or web address] and use [place]'s universal password to access. If you do not have the password, please contact the hospital operator. Locate the [facility] provider you are looking for under [facility] and page to a number that you can be directly reached. If you still have difficulty reaching the provider, please page the [facility] (Director on Call) for the Hospitalists listed on amion for assistance.  07/01/2021, 8:22 AM   This document was prepared using [employer] and may contain some unintended transcription errors.

## 2021-07-01 NOTE — ED Provider Notes (Signed)
[facility] DEPT Provider Note   CSN: [id number] Arrival date & time: 06/30/21  2150     History Chief Complaint  Patient presents with   Abdominal Pain    Dylan Good is a 50 y.o. male with a history of renal cell adenocarcinoma with metastasis to the lung on chemotherapy who returns to the emergency department with complaints of continued abdominal pain.  Patient states he was recently admitted to the hospital for pancreatitis, he was discharged after his pain improved on the 15th, relays the pain never fully resolved, he was taking pain medication at home with improvement until he ran out.  After running out of pain medication he presented to the emergency department 06/27/2021, he was given a dose of IV analgesics and oral analgesics with improvement ultimately was discharged with pain medication.  He has been taking this as prescribed but it is no longer helping at all despite continuing to take it, he is having persistent severe pain in the epigastrium radiating to the back.  No alleviating or aggravating factors.  He denies fever, vomiting, diarrhea, melena, hematochezia, or dysuria.  HPI     Past Medical History:  Diagnosis Date   Cancer, metastatic to lung [facility])    Renal cell adenocarcinoma, left [facility])     Patient Active Problem List   Diagnosis Date Noted   Pancreatitis 06/19/2021   Chest tube in place    Pleural effusion on left 01/31/2021   Brain metastases ([place]) 01/24/2021   Hypercalcemia 01/15/2021   Kidney cancer, primary, with metastasis from kidney to other site [facility]) 12/29/2020    Past Surgical History:  Procedure Laterality Date   APPENDECTOMY     CHEST TUBE INSERTION Left 02/06/2021   Procedure: INSERTION PLEURAL DRAINAGE CATHETER;  Surgeon: Garner Nash, DO;  Location: [place];  Service: Pulmonary;  Laterality: Left;   CHEST TUBE INSERTION N/A 04/04/2021   Procedure: removal of pleurex cath;  Surgeon: Garner Nash, DO;  Location: [place];  Service: Pulmonary;  Laterality: N/A;       History reviewed. No pertinent family history.  Social History   Tobacco Use   Smoking status: Some Days    Packs/day: 0.50    Types: Cigarettes   Smokeless tobacco: Never  Vaping Use   Vaping Use: Never used  Substance Use Topics   Alcohol use: Never   Drug use: Never    Home Medications Prior to Admission medications   Medication Sig Start Date End Date Taking? Authorizing Provider  acetaminophen (TYLENOL) 500 MG tablet Take 1,000 mg by mouth every 6 (six) hours as needed (pain).   Yes [provider]  diclofenac Sodium (VOLTAREN) 1 % GEL Apply 2 g topically 4 (four) times daily. Patient taking differently: Apply 2 g topically daily as needed (for joint pain). 06/21/21  Yes Sheikh, Omair Latif, DO  docusate sodium (COLACE) 100 MG capsule Take 1 capsule (100 mg total) by mouth 2 (two) times daily. Patient taking differently: Take 100 mg by mouth daily as needed. 06/21/21  Yes Sheikh, Omair Latif, DO  ondansetron (ZOFRAN) 4 MG tablet Take 1 tablet (4 mg total) by mouth every 6 (six) hours as needed for nausea. 06/21/21  Yes Sheikh, Omair Latif, DO  oxyCODONE (ROXICODONE) 5 MG immediate release tablet Take 0.5-1 tablets (2.5-5 mg total) by mouth every 6 (six) hours as needed for up to 5 days for severe pain. 06/27/21 07/02/21 Yes Cardama, Grayce Sessions, MD  pantoprazole ([place])  40 MG tablet Take 1 tablet (40 mg total) by mouth daily. Patient taking differently: Take 40 mg by mouth daily as needed (for acid reflux). 06/21/21  Yes Sheikh, Omair Latif, DO  polyethylene glycol (MIRALAX / GLYCOLAX) 17 g packet Take 17 g by mouth daily. Patient taking differently: Take 17 g by mouth daily as needed for mild constipation. 06/21/21  Yes Sheikh, Omair Latif, DO  amLODipine (NORVASC) 5 MG tablet TAKE 1 TABLET (5 MG TOTAL) BY MOUTH DAILY. 06/27/21   Heilingoetter, Cassandra L, PA-C    Allergies     Patient has no known allergies.  Review of Systems   Review of Systems  Constitutional:  Negative for chills and fever.  Respiratory:  Negative for shortness of breath.   Gastrointestinal:  Positive for abdominal pain. Negative for blood in stool, diarrhea and vomiting.  Genitourinary:  Negative for dysuria.  Neurological:  Negative for syncope.  All other systems reviewed and are negative.  Physical Exam Updated Vital Signs BP 134/83    Pulse 63    Temp 98.9 F (37.2 C)    Resp 18    Ht 5\' 9"  (1.753 m)    Wt 78.9 kg    SpO2 100%    BMI 25.70 kg/m   Physical Exam Vitals and nursing note reviewed.  Constitutional:      General: He is not in acute distress.    Appearance: He is well-developed. He is not toxic-appearing.  HENT:     Head: Normocephalic and atraumatic.  Eyes:     General:        Right eye: No discharge.        Left eye: No discharge.     Conjunctiva/sclera: Conjunctivae normal.  Cardiovascular:     Rate and Rhythm: Normal rate and regular rhythm.  Pulmonary:     Effort: Pulmonary effort is normal. No respiratory distress.     Breath sounds: Normal breath sounds. No wheezing, rhonchi or rales.  Abdominal:     General: There is no distension.     Palpations: Abdomen is soft.     Tenderness: There is abdominal tenderness (Generalized with increased focality to the epigastrium.).  Musculoskeletal:     Cervical back: Neck supple.  Skin:    General: Skin is warm and dry.     Findings: No rash.  Neurological:     Mental Status: He is alert.     Comments: Clear speech.   Psychiatric:        Behavior: Behavior normal.    ED Results / Procedures / Treatments   Labs (all labs ordered are listed, but only abnormal results are displayed) Labs Reviewed  COMPREHENSIVE METABOLIC PANEL - Abnormal; Notable for the following components:      Result Value   Sodium 134 (*)    Glucose, Bld 100 (*)    BUN 5 (*)    Total Protein 8.4 (*)    AST 13 (*)    Total  Bilirubin 1.3 (*)    All other components within normal limits  CBC WITH DIFFERENTIAL/PLATELET - Abnormal; Notable for the following components:   WBC 3.7 (*)    RBC 6.15 (*)    MCV 73.7 (*)    MCH 22.9 (*)    RDW 23.1 (*)    All other components within normal limits  LIPASE, BLOOD - Abnormal; Notable for the following components:   Lipase 382 (*)    All other components within normal limits  RESP PANEL BY  RT-PCR (FLU A&B, COVID) ARPGX2  URINALYSIS, ROUTINE W REFLEX MICROSCOPIC    EKG EKG Interpretation  Date/Time:  Sunday July 01 2021 02:51:20 EST Ventricular Rate:  88 PR Interval:  144 QRS Duration: 86 QT Interval:  372 QTC Calculation: 451 R Axis:   76 Text Interpretation: Sinus rhythm Normal ECG When compared with ECG of 01/20/2021, HEART RATE has decreased Confirmed by Delora Fuel ([id number]) on 07/01/2021 3:21:46 AM  Radiology CT Abdomen Pelvis W Contrast  Result Date: 07/01/2021 CLINICAL DATA:  Pancreatitis, persistent.  Abdominal pain EXAM: CT ABDOMEN AND PELVIS WITH CONTRAST TECHNIQUE: Multidetector CT imaging of the abdomen and pelvis was performed using the standard protocol following bolus administration of intravenous contrast. CONTRAST:  32mL OMNIPAQUE IOHEXOL 350 MG/ML SOLN COMPARISON:  06/19/2021 FINDINGS: Lower chest: Pleural base masses are again seen in the left lower hemithorax. The superior larger. Largest nodule measures 2.9 cm. This was not as well visualized on prior study but approximate size 2.6 cm. Small left pleural effusion, slightly increased since prior study. Spiculated mass in the left lower lobe measures up to 2.3 cm compared to 2 cm previously. Multiple other lower lobe pulmonary nodules appear similar to prior study. Hepatobiliary: No focal hepatic abnormality. Gallbladder unremarkable. Pancreas: No focal abnormality or ductal dilatation. No surrounding inflammation. Spleen: No focal abnormality.  Normal size. Adrenals/Urinary Tract: Large  heterogeneous partially necrotic mass again seen arising from the right kidney measures approximately 11 x 11 cm. This does not appear appreciably changed since prior study. Bilateral adrenal masses and solid left renal mass again noted, unchanged. No hydronephrosis. Suspect small calcification layering dependently in the bladder posteriorly. This measures approximately 2 mm. Stomach/Bowel: Stomach, large and small bowel grossly unremarkable. Vascular/Lymphatic: No evidence of aneurysm or adenopathy. Reproductive: No visible focal abnormality. Other: No free fluid or free air. Musculoskeletal: No acute bony abnormality. IMPRESSION: Large right necrotic renal mass again noted, not significantly changed. Stable solid left renal mass, bilateral adrenal metastases, and several pulmonary nodules visualized in the lower lungs. Numerous pleural based lesions in the left lower chest appears slightly more prominent than prior study with small left pleural effusion increasing slightly. 2 mm calcification layering dependently in the posterior bladder, presumably bladder stone. No hydronephrosis. No acute findings. Electronically Signed   By: Rolm Baptise M.D.   On: 07/01/2021 05:12    Procedures Procedures   Medications Ordered in ED Medications  sodium chloride 0.9 % bolus 1,000 mL (0 mLs Intravenous Stopped 07/01/21 0418)  ondansetron (ZOFRAN) injection 4 mg (4 mg Intravenous Given 07/01/21 0233)  HYDROmorphone (DILAUDID) injection 1 mg (1 mg Intravenous Given 07/01/21 0233)  HYDROmorphone (DILAUDID) injection 1 mg (1 mg Intravenous Given 07/01/21 0419)  sodium chloride 0.9 % bolus 1,000 mL (0 mLs Intravenous Stopped 07/01/21 0515)  iohexol (OMNIPAQUE) 350 MG/ML injection 80 mL (80 mLs Intravenous Contrast Given 07/01/21 0444)    ED Course  I have reviewed the triage vital signs and the nursing notes.  Pertinent labs & imaging results that were available during my care of the patient were reviewed by me and  considered in my medical decision making (see chart for details).    MDM Rules/Calculators/A&P   Patient presents to the ED with complaints of abdominal pain.  Nontoxic, blood pressure elevated, vitals otherwise fairly unremarkable.  Generalized abdominal tenderness with increased focality in the epigastrium.  Will check labs and administer fluids and analgesics.  Additional history obtained:  Additional history obtained from chart review & nursing note review.  Recent hospital admission 1212/22 through 06/21/21 when he presented with abdominal pain and was found to have findings of pancreatitis, treated with analgesics, ultimately able to be discharged.  He returned to the emergency department 06/27/2021 with recurrent pancreatitis, ultimately felt comfortable with discharge at that time.  Lab Tests:  I Ordered, reviewed, and interpreted labs, which included:  CBC: Leukopenia, no neutropenia. CMP: Mild T bili elevation new from prior, no LFT elevation Lipase: Elevated at 382  On reassessment following fluids and Dilaudid patient still having moderate pain, additional dose of Dilaudid ordered.  Given his pain has never quite resolved from his recent hospital admission and is now worsening plan for repeat CT through shared decision-making with the patient.  Imaging Studies ordered:  I ordered imaging studies which included CT abdomen/pelvis with contrast, I independently reviewed, formal radiology impression shows: Large right necrotic renal mass again noted, not significantly changed. Stable solid left renal mass, bilateral adrenal metastases, and several pulmonary nodules visualized in the lower lungs. Numerous pleural based lesions in the left lower chest appears slightly more prominent than prior study with small left pleural effusion increasing slightly. 2 mm calcification layering dependently in the posterior bladder, presumably bladder stone. No hydronephrosis. No acute findings  ED  Course:  CT scan fairly similar to prior.  No pancreatic pseudocyst or pancreatic changes present.  On reassessment patient is still having pain but is improved some.  We discussed options of care including admission versus discharge home.  Patient does not feel comfortable going home after trying this previously.  Will discuss with hospital service for admission.  Discussed with supervising phsyician Dr. Roxanne Mins- in agreement.   06:20: CONSULT: Discussed with hospitalist Dr. Bridgett Larsson- accepts admission, AM team to see.   Portions of this note were generated with [employer]. Dictation errors may occur despite best attempts at proofreading.   Final Clinical Impression(s) / ED Diagnoses Final diagnoses:  Acute pancreatitis, unspecified complication status, unspecified pancreatitis type    Rx / DC Orders ED Discharge Orders     None        Amaryllis Dyke, PA-C 40/76/80 [id number]    Delora Fuel, MD 09/19/92 [phone]

## 2021-07-02 DIAGNOSIS — C7931 Secondary malignant neoplasm of brain: Secondary | ICD-10-CM | POA: Diagnosis not present

## 2021-07-02 DIAGNOSIS — C641 Malignant neoplasm of right kidney, except renal pelvis: Secondary | ICD-10-CM

## 2021-07-02 DIAGNOSIS — R52 Pain, unspecified: Secondary | ICD-10-CM | POA: Diagnosis not present

## 2021-07-02 DIAGNOSIS — K859 Acute pancreatitis without necrosis or infection, unspecified: Secondary | ICD-10-CM | POA: Diagnosis not present

## 2021-07-02 LAB — CBC WITH DIFFERENTIAL/PLATELET
Abs Immature Granulocytes: 0.01 10*3/uL (ref 0.00–0.07)
Basophils Absolute: 0 10*3/uL (ref 0.0–0.1)
Basophils Relative: 1 %
Eosinophils Absolute: 0.1 10*3/uL (ref 0.0–0.5)
Eosinophils Relative: 4 %
HCT: 37.3 % — ABNORMAL LOW (ref 39.0–52.0)
Hemoglobin: 11.8 g/dL — ABNORMAL LOW (ref 13.0–17.0)
Immature Granulocytes: 0 %
Lymphocytes Relative: 33 %
Lymphs Abs: 1.1 10*3/uL (ref 0.7–4.0)
MCH: 23.3 pg — ABNORMAL LOW (ref 26.0–34.0)
MCHC: 31.6 g/dL (ref 30.0–36.0)
MCV: 73.6 fL — ABNORMAL LOW (ref 80.0–100.0)
Monocytes Absolute: 0.5 10*3/uL (ref 0.1–1.0)
Monocytes Relative: 13 %
Neutro Abs: 1.7 10*3/uL (ref 1.7–7.7)
Neutrophils Relative %: 49 %
Platelets: 249 10*3/uL (ref 150–400)
RBC: 5.07 MIL/uL (ref 4.22–5.81)
RDW: 22.9 % — ABNORMAL HIGH (ref 11.5–15.5)
WBC: 3.4 10*3/uL — ABNORMAL LOW (ref 4.0–10.5)
nRBC: 0 % (ref 0.0–0.2)

## 2021-07-02 LAB — COMPREHENSIVE METABOLIC PANEL
ALT: 10 U/L (ref 0–44)
AST: 9 U/L — ABNORMAL LOW (ref 15–41)
Albumin: 3.1 g/dL — ABNORMAL LOW (ref 3.5–5.0)
Alkaline Phosphatase: 64 U/L (ref 38–126)
Anion gap: 7 (ref 5–15)
BUN: <5 mg/dL — ABNORMAL LOW (ref 6–20)
CO2: 23 mmol/L (ref 22–32)
Calcium: 8.4 mg/dL — ABNORMAL LOW (ref 8.9–10.3)
Chloride: 105 mmol/L (ref 98–111)
Creatinine, Ser: 0.51 mg/dL — ABNORMAL LOW (ref 0.61–1.24)
GFR, Estimated: >60 mL/min (ref >60)
Glucose, Bld: 81 mg/dL (ref 70–99)
Potassium: 3.9 mmol/L (ref 3.5–5.1)
Sodium: 135 mmol/L (ref 135–145)
Total Bilirubin: 1.4 mg/dL — ABNORMAL HIGH (ref 0.3–1.2)
Total Protein: 6.6 g/dL (ref 6.5–8.1)

## 2021-07-02 LAB — LIPASE, BLOOD: Lipase: 248 U/L — ABNORMAL HIGH (ref 11–51)

## 2021-07-02 LAB — TRIGLYCERIDES: Triglycerides: 60 mg/dL (ref <150)

## 2021-07-02 NOTE — Progress Notes (Signed)
Transition of Care [facility]) Screening Note  Patient Details  Name: Dylan Good Date of Birth: October 04, 1970  Transition of Care [facility]) CM/SW Contact:    Sherie Don, LCSW Phone Number: 07/02/2021, 11:02 AM  Transition of Care Department [facility]) has reviewed patient and no TOC needs have been identified at this time. We will continue to monitor patient advancement through interdisciplinary progression rounds. If new patient transition needs arise, please place a TOC consult.

## 2021-07-02 NOTE — H&P (View-Only) (Signed)
Referring Provider: [facility] Primary Care Physician:  Pcp, No Primary Gastroenterologist:  Althia Forts  Reason for Consultation:  Abdominal pain  HPI: Dylan Good is a 50 y.o. 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  Patient seen in ED 06/18/2021 and 06/27/21 for drug-induced pancreatitis. He was discharged 12/21 with pain medication. After running out of pain medication, his abdominal pain remained persistant so he returned to the ED.  He states he has epigsatric pain, that feels like pins/needles, that has been intermittent for the past few weeks. States it is typically triggered with consuming food or beverages. He states he tried sprite yesterday and that triggered it. The other day he had a burger and that made it worse as well. States it is under his rib cage as well and radiates to his back. Denies nausea/vomiting. Denies melena/hematochezia. Denies NSAID use, though does use tylenol often. Denies alcohol use. Smokes 2-3 cigarettes per day, this began June 2022 when he received his cancer diagnosis. Denies family history of colon cancer. No previous history of colonoscopies. Patient is currently receiving chemotherapy and has received radiation in the past (August 2022).  Past Medical History:  Diagnosis Date   Cancer, metastatic to lung [facility])    Hypertension 07/01/2021   Renal cell adenocarcinoma, left [facility])     Past Surgical History:  Procedure Laterality Date   APPENDECTOMY     CHEST TUBE INSERTION Left 02/06/2021   Procedure: INSERTION PLEURAL DRAINAGE CATHETER;  Surgeon: Garner Nash, DO;  Location: [place];  Service: Pulmonary;  Laterality: Left;   CHEST TUBE INSERTION N/A 04/04/2021   Procedure: removal of pleurex cath;  Surgeon: Garner Nash, DO;  Location: [place];  Service:  Pulmonary;  Laterality: N/A;    Prior to Admission medications   Medication Sig Start Date End Date Taking? Authorizing Provider  acetaminophen (TYLENOL) 500 MG tablet Take 1,000 mg by mouth every 6 (six) hours as needed (pain).   Yes [provider]  diclofenac Sodium (VOLTAREN) 1 % GEL Apply 2 g topically 4 (four) times daily. Patient taking differently: Apply 2 g topically daily as needed (for joint pain). 06/21/21  Yes Sheikh, Omair Latif, DO  docusate sodium (COLACE) 100 MG capsule Take 1 capsule (100 mg total) by mouth 2 (two) times daily. Patient taking differently: Take 100 mg by mouth daily as needed. 06/21/21  Yes Sheikh, Omair Latif, DO  ondansetron (ZOFRAN) 4 MG tablet Take 1 tablet (4 mg total) by mouth every 6 (six) hours as needed for nausea. 06/21/21  Yes Sheikh, Omair Latif, DO  oxyCODONE (ROXICODONE) 5 MG immediate release tablet Take 0.5-1 tablets (2.5-5 mg total) by mouth every 6 (six) hours as needed for up to 5 days for severe pain. 06/27/21 07/02/21 Yes Cardama, Grayce Sessions, MD  pantoprazole (PROTONIX) 40 MG tablet Take 1 tablet (40 mg total) by mouth daily. Patient taking differently: Take 40 mg by mouth daily as needed (for acid reflux). 06/21/21  Yes Sheikh, Omair Latif, DO  polyethylene glycol (MIRALAX / GLYCOLAX) 17 g packet Take 17 g by mouth daily. Patient taking differently: Take 17 g by mouth daily as needed for mild constipation. 06/21/21  Yes Sheikh, Omair Latif, DO  amLODipine (NORVASC) 5 MG tablet TAKE 1 TABLET (5 MG TOTAL) BY MOUTH DAILY. 06/27/21   Heilingoetter, Cassandra L, PA-C    Scheduled  Meds:  amLODipine  5 mg Oral Daily   pantoprazole (PROTONIX) IV  40 mg Intravenous Q24H   Continuous Infusions:  lactated ringers 100 mL/hr at 07/02/21 0043   PRN Meds:.acetaminophen **OR** acetaminophen, HYDROmorphone (DILAUDID) injection, ondansetron **OR** ondansetron (ZOFRAN) IV  Allergies as of 06/30/2021   (No Known Allergies)    Family  History  Problem Relation Age of Onset   Cancer Other     Social History   Socioeconomic History   Marital status: Single    Spouse name: Not on file   Number of children: Not on file   Years of education: Not on file   Highest education level: Not on file  Occupational History   Not on file  Tobacco Use   Smoking status: Some Days    Packs/day: 0.50    Types: Cigarettes   Smokeless tobacco: Never  Vaping Use   Vaping Use: Never used  Substance and Sexual Activity   Alcohol use: Never   Drug use: Never   Sexual activity: Not on file  Other Topics Concern   Not on file  Social History Narrative   Not on file   Social Determinants of Health   Financial Resource Strain: Not on file  Food Insecurity: Not on file  Transportation Needs: Not on file  Physical Activity: Not on file  Stress: Not on file  Social Connections: Not on file  Intimate Partner Violence: Not on file    Review of Systems: Review of Systems  Constitutional:  Negative for chills and fever.  HENT:  Negative for hearing loss and tinnitus.   Eyes:  Negative for blurred vision and double vision.  Respiratory:  Negative for cough and hemoptysis.   Cardiovascular:  Negative for chest pain and palpitations.  Gastrointestinal:  Positive for abdominal pain. Negative for blood in stool, constipation, diarrhea, heartburn, melena, nausea and vomiting.  Genitourinary:  Negative for dysuria and urgency.  Musculoskeletal:  Negative for myalgias and neck pain.  Skin:  Negative for itching and rash.  Neurological:  Negative for seizures.  Psychiatric/Behavioral:  Negative for substance abuse. The patient is not nervous/anxious.     Physical Exam:Physical Exam Constitutional:      Appearance: Normal appearance.  HENT:     Head: Normocephalic and atraumatic.     Nose: Nose normal. No congestion.     Mouth/Throat:     Mouth: Mucous membranes are moist.     Pharynx: Oropharynx is clear.  Eyes:     Extraocular  Movements: Extraocular movements intact.     Conjunctiva/sclera: Conjunctivae normal.  Cardiovascular:     Rate and Rhythm: Normal rate and regular rhythm.  Pulmonary:     Effort: Pulmonary effort is normal. No respiratory distress.  Abdominal:     General: Abdomen is flat. Bowel sounds are normal. There is no distension.     Palpations: Abdomen is soft. There is no mass.     Tenderness: There is abdominal tenderness (epigastric). There is guarding. There is no rebound.     Hernia: No hernia is present.  Musculoskeletal:        General: No swelling. Normal range of motion.     Cervical back: Normal range of motion and neck supple.  Skin:    General: Skin is warm and dry.  Neurological:     General: No focal deficit present.     Mental Status: He is alert and oriented to person, place, and time.  Psychiatric:  Mood and Affect: Mood normal.        Behavior: Behavior normal.        Thought Content: Thought content normal.        Judgment: Judgment normal.    Vital signs: Vitals:   07/02/21 0135 07/02/21 0542  BP: (!) 154/93 (!) 141/75  Pulse: 89 86  Resp: 16 16  Temp: 98 F (36.7 C) 98.3 F (36.8 C)  SpO2: 93% 96%   Last BM Date: 06/28/21    GI:  Lab Results: Recent Labs    07/01/21 0213 07/02/21 0412  WBC 3.7* 3.4*  HGB 14.1 11.8*  HCT 45.3 37.3*  PLT 348 249   BMET Recent Labs    07/01/21 0213 07/02/21 0412  NA 134* 135  K 3.9 3.9  CL 101 105  CO2 23 23  GLUCOSE 100* 81  BUN 5* <5*  CREATININE 0.64 0.51*  CALCIUM 8.9 8.4*   LFT Recent Labs    07/02/21 0412  PROT 6.6  ALBUMIN 3.1*  AST 9*  ALT 10  ALKPHOS 64  BILITOT 1.4*   PT/INR No results for input(s): LABPROT, INR in the last 72 hours.   Studies/Results: CT Abdomen Pelvis W Contrast  Result Date: 07/01/2021 CLINICAL DATA:  Pancreatitis, persistent.  Abdominal pain EXAM: CT ABDOMEN AND PELVIS WITH CONTRAST TECHNIQUE: Multidetector CT imaging of the abdomen and pelvis was  performed using the standard protocol following bolus administration of intravenous contrast. CONTRAST:  90mL OMNIPAQUE IOHEXOL 350 MG/ML SOLN COMPARISON:  06/19/2021 FINDINGS: Lower chest: Pleural base masses are again seen in the left lower hemithorax. The superior larger. Largest nodule measures 2.9 cm. This was not as well visualized on prior study but approximate size 2.6 cm. Small left pleural effusion, slightly increased since prior study. Spiculated mass in the left lower lobe measures up to 2.3 cm compared to 2 cm previously. Multiple other lower lobe pulmonary nodules appear similar to prior study. Hepatobiliary: No focal hepatic abnormality. Gallbladder unremarkable. Pancreas: No focal abnormality or ductal dilatation. No surrounding inflammation. Spleen: No focal abnormality.  Normal size. Adrenals/Urinary Tract: Large heterogeneous partially necrotic mass again seen arising from the right kidney measures approximately 11 x 11 cm. This does not appear appreciably changed since prior study. Bilateral adrenal masses and solid left renal mass again noted, unchanged. No hydronephrosis. Suspect small calcification layering dependently in the bladder posteriorly. This measures approximately 2 mm. Stomach/Bowel: Stomach, large and small bowel grossly unremarkable. Vascular/Lymphatic: No evidence of aneurysm or adenopathy. Reproductive: No visible focal abnormality. Other: No free fluid or free air. Musculoskeletal: No acute bony abnormality. IMPRESSION: Large right necrotic renal mass again noted, not significantly changed. Stable solid left renal mass, bilateral adrenal metastases, and several pulmonary nodules visualized in the lower lungs. Numerous pleural based lesions in the left lower chest appears slightly more prominent than prior study with small left pleural effusion increasing slightly. 2 mm calcification layering dependently in the posterior bladder, presumably bladder stone. No hydronephrosis. No  acute findings. Electronically Signed   By: Rolm Baptise M.D.   On: 07/01/2021 05:12    Impression: Pancreatitis - Lipase: 382 - Hgb 11.8, MCV 73.6 - Normal LFTs - T. Bili 1.4 - Normal renal function - CT ab/pelvis with contrast 12/25: large right necrotic renal mass, unchanged. Bilateral adrenal masses. Calcification on bladder, normal pancreas (no duct dilation or surrounding inflammation).  Kidney cancer with metastasis  Hypertension  Plan: Will obtain triglycerides and IgG4 to evaluate for etiology of recurrent pancreatitis.  Continue clear liquid diet as tolerated Could possibly benefit from EGD evaluation later this week to evaluate for PUD as an etiology for elevated lipase. Continue IVF Continue pain management prn [facility] will follow    LOS: 1 day   Havah Ammon Radford Pax  PA-C 07/02/2021, 9:15 AM  Contact #  [phone]

## 2021-07-02 NOTE — Progress Notes (Signed)
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  He was admitted a few weeks ago and was suspected to have pancreatitis in the setting of his chemotherapy.  Lipase was mildly elevated at that time and CT did not show any inflammatory changes.  His lipase trended down but then slightly up and his  abdominal pain improved.  He was discharged from the hospital in stable condition however came back to the ED on 06/27/2021 and work-up at that time was consistent with pancreatitis at that time.  He had run out of his pain medications at home and his pain was able to be controlled with a single dose of Dilaudid and oral pain medication.  He was able to tolerate oral intake and then was discharged and stable condition but then subsequently came back yesterday to the ED with persistent abdominal pain that radiated to the back as well as decreased appetite and mild nausea.  Vital signs were stable and he received 2 L normal same bolus and given hydromorphone in the ED.  Lipase went from 528 -> 382 -> 248.  Because of his recurrent abdominal pain and suspected recurrent pancreatitis GI was consulted and they feel the patient has acute pancreatitis based on abdominal pain and elevated lipase but negative inflammation on CT scan.  They do not think an MRCP is needed at this time and they recommended getting triglyceride and IgG4 level and they are going to evaluate with an EGD for peptic ulcer disease with elevated epigastric pain elevated lipase and he is going to be n.p.o. after midnight for EGD tomorrow.  If EGD is unrevealing then  we will consider MRCP of the pancreas to further evaluate for inflammation despite negative CT scans.   Assessment & Plan:   Principal Problem:   Intractable pain Active Problems:   Kidney cancer, primary, with metastasis from kidney to other site [facility])   Brain metastases (HCC)   Pancreatitis   Hypertension   Tobacco use   Acute pancreatitis  Abdominal Pain with concern for Recurrent Acute Pancreatitis  -Presented with crampy abdominal pain in the area of the epigastrium and umbilicus -He tried Tylenol and Tums but it did not help -Lipase level improving  -Currently getting lactated Ringer's 100 MLS per hour -CT Scan done and showed "Large right necrotic renal mass again noted, not significantly changed. Stable solid left renal mass, bilateral adrenal metastases,and several pulmonary nodules visualized in the lower lungs. Numerous pleural based lesions in the left lower chest appears slightly more prominent than prior study with small left pleural effusion increasing slightly. 2 mm calcification layering dependently in the posterior bladder, presumably bladder stone. No hydronephrosis. No acute findings." -Currently on a clear liquid diet but will be n.p.o. after midnight -Continue with pain control IV hydromorphone 1 mg every 3 h as needed severe pain -Continue with ondansetron 4 mg p.o./IV every 6 as needed nausea  -Continue with IV pantoprazole 40 mg q. 24h -Gastroenterology consulted recommending obtaining triglyceride level and IgG4 and they agree on pursuing endoscopy in the morning   Hx of Left Kidney Renal Cell Adenocarcinoma with Mets to the Lungs -  Was on Cabozantinib but this was discontinued last admission -Patient has a follow-up February brain MRI and follow-up with Dr. Isidore Moos that is scheduled -Follow-up with [name] in outpatient setting   Tobacco Abuse -Smoking Cessation Counseling given that he still smokes twice a week -If necessary will provide the patient a  Nicotine Patch   Hypertension -Continue amlodipine 5 mg p.o. daily -Continue monitor blood pressures per protocol -Last blood pressure reading was 142/81  Chronic Dyspnea -CT scan as above with improvement in his masses and decrease in the size and number of pulmonary nodules -Continue with fluticasone furoate-vilanterol 200-25 mg per actuation 1 puff IH daily   Leukopenia -Mild and patient's WBC went from 3.7 and then trended down to 3.4 -Continue to monitor and trend and repeat CBC in a.m.  DVT prophylaxis: SCDs Code Status: FULL CODE  Family Communication: No family present at bedside  Disposition Plan: Pending further clinical improvement and clearance by GI  Status is: Inpatient  Remains inpatient appropriate because: As recurrent pancreatitis that needs further work-up and GI involvement   Consultants:  Gastroenterology  Procedures: None  Antimicrobials:  Anti-infectives (From admission, onward)    None        Subjective: Seen and examined at bedside and still having some crampy abdominal pain and states that the pain had improved initially but now worsened again and continues to have pain.  Also has some nausea but no actual vomiting.  Denies any lightheadedness or dizziness.  No other concerns or complaints at this time.  Objective: Vitals:   07/02/21 0135 07/02/21 0542 07/02/21 0923 07/02/21 1227  BP: (!) 154/93 (!) 141/75 138/82 (!) 142/81  Pulse: 89 86 90 87  Resp: 16 16 16 16   Temp: 98 F (36.7 C) 98.3 F (36.8 C) 98.5 F (36.9 C) 97.9 F (36.6 C)  TempSrc: Oral Oral Oral   SpO2: 93% 96% 97% 99%  Weight:      Height:        Intake/Output Summary (Last 24 hours) at 07/02/2021 1639 Last data filed at 07/02/2021 1400 Gross per 24 hour  Intake 2687.52 ml  Output 0 ml  Net 2687.52 ml   Filed Weights   06/30/21 2221 06/30/21 2238  Weight: 78.9 kg 78.9 kg   Examination: Physical Exam:  Constitutional: WN/WD overweight male in no acute  distress appears calm but a little uncomfortable complaining of abdominal pain  Eyes: Lids and conjunctivae normal, sclerae anicteric  ENMT: External Ears, Nose appear normal. Grossly normal hearing.  Neck: Appears normal, supple, no cervical masses, normal ROM, no appreciable thyromegaly; no JVD Respiratory: Diminished to auscultation bilaterally, no wheezing, rales, rhonchi or crackles. Normal respiratory effort and patient is not tachypenic. No accessory muscle use.  Unlabored breathing Cardiovascular: RRR, no murmurs / rubs / gallops. S1 and S2 auscultated. Abdomen: Soft, a little-tender, distended secondary body habitus.  Bowel sounds positive.  GU: Deferred. Musculoskeletal: No clubbing / cyanosis of digits/nails. No joint deformity upper and lower extremities. Skin: No rashes, lesions, ulcers on limited skin evaluation. No induration; Warm and dry.  Neurologic: CN 2-12 grossly intact with no focal deficits. Romberg sign cerebellar reflexes not assessed.  Psychiatric: Normal judgment and insight. Alert and oriented x 3. Normal mood and appropriate affect.   Data Reviewed: I have personally reviewed following labs and imaging studies  CBC: Recent Labs  Lab 06/27/21 0014 07/01/21 0213 07/02/21 0412  WBC 4.6 3.7* 3.4*  NEUTROABS 2.5 1.9 1.7  HGB 14.2 14.1 11.8*  HCT 45.9 45.3 37.3*  MCV 73.0* 73.7* 73.6*  PLT 294 348 277   Basic Metabolic Panel: Recent Labs  Lab 06/27/21 0014 07/01/21 0213 07/02/21 0412  NA 134* 134* 135  K 3.9 3.9 3.9  CL 103 101 105  CO2 23 23 23   GLUCOSE 98 100* 81  BUN 7 5* <5*  CREATININE 0.52* 0.64 0.51*  CALCIUM 8.8* 8.9 8.4*  MG  --  2.0  --   PHOS  --  2.7  --    GFR: Estimated Creatinine Clearance: 112.9 mL/min (A) (by C-G formula based on SCr of 0.51 mg/dL (L)). Liver Function Tests: Recent Labs  Lab 06/27/21 0014 07/01/21 0213 07/02/21 0412  AST 16 13* 9*  ALT 17 12 10   ALKPHOS 94 83 64  BILITOT 0.7 1.3* 1.4*  PROT 8.1 8.4* 6.6   ALBUMIN 3.6 3.8 3.1*   Recent Labs  Lab 06/27/21 0014 07/01/21 0213 07/02/21 0412  LIPASE 528* 382* 248*   No results for input(s): AMMONIA in the last 168 hours. Coagulation Profile: No results for input(s): INR, PROTIME in the last 168 hours. Cardiac Enzymes: No results for input(s): CKTOTAL, CKMB, CKMBINDEX, TROPONINI in the last 168 hours. BNP (last 3 results) No results for input(s): PROBNP in the last 8760 hours. HbA1C: No results for input(s): HGBA1C in the last 72 hours. CBG: No results for input(s): GLUCAP in the last 168 hours. Lipid Profile: Recent Labs    07/02/21 1001  TRIG 60   Thyroid Function Tests: No results for input(s): TSH, T4TOTAL, FREET4, T3FREE, THYROIDAB in the last 72 hours. Anemia Panel: No results for input(s): VITAMINB12, FOLATE, FERRITIN, TIBC, IRON, RETICCTPCT in the last 72 hours. Sepsis Labs: No results for input(s): PROCALCITON, LATICACIDVEN in the last 168 hours.  Recent Results (from the past 240 hour(s))  Resp Panel by RT-PCR (Flu A&B, Covid) Nasopharyngeal Swab     Status: None   Collection Time: 06/27/21  2:05 AM   Specimen: Nasopharyngeal Swab; Nasopharyngeal(NP) swabs in vial transport medium  Result Value Ref Range Status   SARS Coronavirus 2 by RT PCR NEGATIVE NEGATIVE Final    Comment: (NOTE) SARS-CoV-2 target nucleic acids are NOT DETECTED.  The SARS-CoV-2 RNA is generally detectable in upper respiratory specimens during the acute phase of infection. The lowest concentration of SARS-CoV-2 viral copies this assay can detect is 138 copies/mL. A negative result does not preclude SARS-Cov-2 infection and should not be used as the sole basis for treatment or other patient management decisions. A negative result may occur with  improper specimen collection/handling, submission of specimen other than nasopharyngeal swab, presence of viral mutation(s) within the areas targeted by this assay, and inadequate number of  viral copies(<138 copies/mL). A negative result must be combined with clinical observations, patient history, and epidemiological information. The expected result is Negative.  Fact Sheet for Patients:  EntrepreneurPulse.com.au  Fact Sheet for Healthcare Providers:  IncredibleEmployment.be  This test is no t yet approved or cleared by the [place] FDA and  has been authorized for detection and/or diagnosis of SARS-CoV-2 by FDA under an Emergency Use Authorization (EUA). This EUA will remain  in effect (meaning this test can be used) for the duration of the COVID-19 declaration under Section 564(b)(1) of the Act, 21 U.S.C.section 360bbb-3(b)(1), unless the authorization is terminated  or revoked sooner.       Influenza A by PCR NEGATIVE NEGATIVE Final   Influenza B by PCR NEGATIVE NEGATIVE Final    Comment: (NOTE)  The Xpert Xpress SARS-CoV-2/FLU/RSV plus assay is intended as an aid in the diagnosis of influenza from Nasopharyngeal swab specimens and should not be used as a sole basis for treatment. Nasal washings and aspirates are unacceptable for Xpert Xpress SARS-CoV-2/FLU/RSV testing.  Fact Sheet for Patients: EntrepreneurPulse.com.au  Fact Sheet for Healthcare Providers: IncredibleEmployment.be  This test is not yet approved or cleared by the [place] FDA and has been authorized for detection and/or diagnosis of SARS-CoV-2 by FDA under an Emergency Use Authorization (EUA). This EUA will remain in effect (meaning this test can be used) for the duration of the COVID-19 declaration under Section 564(b)(1) of the Act, 21 U.S.C. section 360bbb-3(b)(1), unless the authorization is terminated or revoked.  Performed at [facility], [place] [address] [place], [place] [address]   Resp Panel by RT-PCR (Flu A&B, Covid) Nasopharyngeal Swab     Status: None   Collection Time:  07/01/21  6:15 AM   Specimen: Nasopharyngeal Swab; Nasopharyngeal(NP) swabs in vial transport medium  Result Value Ref Range Status   SARS Coronavirus 2 by RT PCR NEGATIVE NEGATIVE Final    Comment: (NOTE) SARS-CoV-2 target nucleic acids are NOT DETECTED.  The SARS-CoV-2 RNA is generally detectable in upper respiratory specimens during the acute phase of infection. The lowest concentration of SARS-CoV-2 viral copies this assay can detect is 138 copies/mL. A negative result does not preclude SARS-Cov-2 infection and should not be used as the sole basis for treatment or other patient management decisions. A negative result may occur with  improper specimen collection/handling, submission of specimen other than nasopharyngeal swab, presence of viral mutation(s) within the areas targeted by this assay, and inadequate number of viral copies(<138 copies/mL). A negative result must be combined with clinical observations, patient history, and epidemiological information. The expected result is Negative.  Fact Sheet for Patients:  EntrepreneurPulse.com.au  Fact Sheet for Healthcare Providers:  IncredibleEmployment.be  This test is no t yet approved or cleared by the [place] FDA and  has been authorized for detection and/or diagnosis of SARS-CoV-2 by FDA under an Emergency Use Authorization (EUA). This EUA will remain  in effect (meaning this test can be used) for the duration of the COVID-19 declaration under Section 564(b)(1) of the Act, 21 U.S.C.section 360bbb-3(b)(1), unless the authorization is terminated  or revoked sooner.       Influenza A by PCR NEGATIVE NEGATIVE Final   Influenza B by PCR NEGATIVE NEGATIVE Final    Comment: (NOTE) The Xpert Xpress SARS-CoV-2/FLU/RSV plus assay is intended as an aid in the diagnosis of influenza from Nasopharyngeal swab specimens and should not be used as a sole basis for treatment. Nasal washings  and aspirates are unacceptable for Xpert Xpress SARS-CoV-2/FLU/RSV testing.  Fact Sheet for Patients: EntrepreneurPulse.com.au  Fact Sheet for Healthcare Providers: IncredibleEmployment.be  This test is not yet approved or cleared by the [place] FDA and has been authorized for detection and/or diagnosis of SARS-CoV-2 by FDA under an Emergency Use Authorization (EUA). This EUA will remain in effect (meaning this test can be used) for the duration of the COVID-19 declaration under Section 564(b)(1) of the Act, 21 U.S.C. section 360bbb-3(b)(1), unless the authorization is terminated or revoked.  Performed at [facility], [place] [address] [place], [place] [address]     RN Pressure Injury Documentation:     Estimated body mass index is 25.7 kg/m as calculated from the following:   Height as of this encounter: 5\' 9"  (1.753 m).  Weight as of this encounter: 78.9 kg.  Malnutrition Type:   Malnutrition Characteristics:   Nutrition Interventions:    Radiology Studies: CT Abdomen Pelvis W Contrast  Result Date: 07/01/2021 CLINICAL DATA:  Pancreatitis, persistent.  Abdominal pain EXAM: CT ABDOMEN AND PELVIS WITH CONTRAST TECHNIQUE: Multidetector CT imaging of the abdomen and pelvis was performed using the standard protocol following bolus administration of intravenous contrast. CONTRAST:  78mL OMNIPAQUE IOHEXOL 350 MG/ML SOLN COMPARISON:  06/19/2021 FINDINGS: Lower chest: Pleural base masses are again seen in the left lower hemithorax. The superior larger. Largest nodule measures 2.9 cm. This was not as well visualized on prior study but approximate size 2.6 cm. Small left pleural effusion, slightly increased since prior study. Spiculated mass in the left lower lobe measures up to 2.3 cm compared to 2 cm previously. Multiple other lower lobe pulmonary nodules appear similar to prior study. Hepatobiliary: No focal hepatic  abnormality. Gallbladder unremarkable. Pancreas: No focal abnormality or ductal dilatation. No surrounding inflammation. Spleen: No focal abnormality.  Normal size. Adrenals/Urinary Tract: Large heterogeneous partially necrotic mass again seen arising from the right kidney measures approximately 11 x 11 cm. This does not appear appreciably changed since prior study. Bilateral adrenal masses and solid left renal mass again noted, unchanged. No hydronephrosis. Suspect small calcification layering dependently in the bladder posteriorly. This measures approximately 2 mm. Stomach/Bowel: Stomach, large and small bowel grossly unremarkable. Vascular/Lymphatic: No evidence of aneurysm or adenopathy. Reproductive: No visible focal abnormality. Other: No free fluid or free air. Musculoskeletal: No acute bony abnormality. IMPRESSION: Large right necrotic renal mass again noted, not significantly changed. Stable solid left renal mass, bilateral adrenal metastases, and several pulmonary nodules visualized in the lower lungs. Numerous pleural based lesions in the left lower chest appears slightly more prominent than prior study with small left pleural effusion increasing slightly. 2 mm calcification layering dependently in the posterior bladder, presumably bladder stone. No hydronephrosis. No acute findings. Electronically Signed   By: Rolm Baptise M.D.   On: 07/01/2021 05:12    Scheduled Meds:  amLODipine  5 mg Oral Daily   pantoprazole (PROTONIX) IV  40 mg Intravenous Q24H   Continuous Infusions:  lactated ringers 100 mL/hr at 07/02/21 1134    LOS: 1 day   Kerney Elbe, DO [facility] PAGER is on AMION  If 7PM-7AM, please contact night-coverage [email or web address]

## 2021-07-02 NOTE — Consult Note (Addendum)
Referring Provider: [facility] Primary Care Physician:  Pcp, No Primary Gastroenterologist:  Althia Forts  Reason for Consultation:  Abdominal pain  HPI: Dylan Good is a 50 y.o. 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  Patient seen in ED 06/18/2021 and 06/27/21 for drug-induced pancreatitis. He was discharged 12/21 with pain medication. After running out of pain medication, his abdominal pain remained persistant so he returned to the ED.  He states he has epigsatric pain, that feels like pins/needles, that has been intermittent for the past few weeks. States it is typically triggered with consuming food or beverages. He states he tried sprite yesterday and that triggered it. The other day he had a burger and that made it worse as well. States it is under his rib cage as well and radiates to his back. Denies nausea/vomiting. Denies melena/hematochezia. Denies NSAID use, though does use tylenol often. Denies alcohol use. Smokes 2-3 cigarettes per day, this began June 2022 when he received his cancer diagnosis. Denies family history of colon cancer. No previous history of colonoscopies. Patient is currently receiving chemotherapy and has received radiation in the past (August 2022).  Past Medical History:  Diagnosis Date   Cancer, metastatic to lung [facility])    Hypertension 07/01/2021   Renal cell adenocarcinoma, left [facility])     Past Surgical History:  Procedure Laterality Date   APPENDECTOMY     CHEST TUBE INSERTION Left 02/06/2021   Procedure: INSERTION PLEURAL DRAINAGE CATHETER;  Surgeon: Garner Nash, DO;  Location: [place] [facility];  Service: Pulmonary;  Laterality: Left;   CHEST TUBE INSERTION N/A 04/04/2021   Procedure: removal of pleurex cath;  Surgeon: Garner Nash, DO;  Location: [place];  Service:  Pulmonary;  Laterality: N/A;    Prior to Admission medications   Medication Sig Start Date End Date Taking? Authorizing Provider  acetaminophen (TYLENOL) 500 MG tablet Take 1,000 mg by mouth every 6 (six) hours as needed (pain).   Yes [provider]  diclofenac Sodium (VOLTAREN) 1 % GEL Apply 2 g topically 4 (four) times daily. Patient taking differently: Apply 2 g topically daily as needed (for joint pain). 06/21/21  Yes Sheikh, Omair Latif, DO  docusate sodium (COLACE) 100 MG capsule Take 1 capsule (100 mg total) by mouth 2 (two) times daily. Patient taking differently: Take 100 mg by mouth daily as needed. 06/21/21  Yes Sheikh, Omair Latif, DO  ondansetron (ZOFRAN) 4 MG tablet Take 1 tablet (4 mg total) by mouth every 6 (six) hours as needed for nausea. 06/21/21  Yes Sheikh, Omair Latif, DO  oxyCODONE (ROXICODONE) 5 MG immediate release tablet Take 0.5-1 tablets (2.5-5 mg total) by mouth every 6 (six) hours as needed for up to 5 days for severe pain. 06/27/21 07/02/21 Yes Cardama, Grayce Sessions, MD  pantoprazole (PROTONIX) 40 MG tablet Take 1 tablet (40 mg total) by mouth daily. Patient taking differently: Take 40 mg by mouth daily as needed (for acid reflux). 06/21/21  Yes Sheikh, Omair Latif, DO  polyethylene glycol (MIRALAX / GLYCOLAX) 17 g packet Take 17 g by mouth daily. Patient taking differently: Take 17 g by mouth daily as needed for mild constipation. 06/21/21  Yes Sheikh, Omair Latif, DO  amLODipine (NORVASC) 5 MG tablet TAKE 1 TABLET (5 MG TOTAL) BY MOUTH DAILY. 06/27/21   Heilingoetter, Cassandra L, PA-C    Scheduled  Meds:  amLODipine  5 mg Oral Daily   pantoprazole (PROTONIX) IV  40 mg Intravenous Q24H   Continuous Infusions:  lactated ringers 100 mL/hr at 07/02/21 0043   PRN Meds:.acetaminophen **OR** acetaminophen, HYDROmorphone (DILAUDID) injection, ondansetron **OR** ondansetron (ZOFRAN) IV  Allergies as of 06/30/2021   (No Known Allergies)    Family  History  Problem Relation Age of Onset   Cancer Other     Social History   Socioeconomic History   Marital status: Single    Spouse name: Not on file   Number of children: Not on file   Years of education: Not on file   Highest education level: Not on file  Occupational History   Not on file  Tobacco Use   Smoking status: Some Days    Packs/day: 0.50    Types: Cigarettes   Smokeless tobacco: Never  Vaping Use   Vaping Use: Never used  Substance and Sexual Activity   Alcohol use: Never   Drug use: Never   Sexual activity: Not on file  Other Topics Concern   Not on file  Social History Narrative   Not on file   Social Determinants of Health   Financial Resource Strain: Not on file  Food Insecurity: Not on file  Transportation Needs: Not on file  Physical Activity: Not on file  Stress: Not on file  Social Connections: Not on file  Intimate Partner Violence: Not on file    Review of Systems: Review of Systems  Constitutional:  Negative for chills and fever.  HENT:  Negative for hearing loss and tinnitus.   Eyes:  Negative for blurred vision and double vision.  Respiratory:  Negative for cough and hemoptysis.   Cardiovascular:  Negative for chest pain and palpitations.  Gastrointestinal:  Positive for abdominal pain. Negative for blood in stool, constipation, diarrhea, heartburn, melena, nausea and vomiting.  Genitourinary:  Negative for dysuria and urgency.  Musculoskeletal:  Negative for myalgias and neck pain.  Skin:  Negative for itching and rash.  Neurological:  Negative for seizures.  Psychiatric/Behavioral:  Negative for substance abuse. The patient is not nervous/anxious.     Physical Exam:Physical Exam Constitutional:      Appearance: Normal appearance.  HENT:     Head: Normocephalic and atraumatic.     Nose: Nose normal. No congestion.     Mouth/Throat:     Mouth: Mucous membranes are moist.     Pharynx: Oropharynx is clear.  Eyes:     Extraocular  Movements: Extraocular movements intact.     Conjunctiva/sclera: Conjunctivae normal.  Cardiovascular:     Rate and Rhythm: Normal rate and regular rhythm.  Pulmonary:     Effort: Pulmonary effort is normal. No respiratory distress.  Abdominal:     General: Abdomen is flat. Bowel sounds are normal. There is no distension.     Palpations: Abdomen is soft. There is no mass.     Tenderness: There is abdominal tenderness (epigastric). There is guarding. There is no rebound.     Hernia: No hernia is present.  Musculoskeletal:        General: No swelling. Normal range of motion.     Cervical back: Normal range of motion and neck supple.  Skin:    General: Skin is warm and dry.  Neurological:     General: No focal deficit present.     Mental Status: He is alert and oriented to person, place, and time.  Psychiatric:  Mood and Affect: Mood normal.        Behavior: Behavior normal.        Thought Content: Thought content normal.        Judgment: Judgment normal.    Vital signs: Vitals:   07/02/21 0135 07/02/21 0542  BP: (!) 154/93 (!) 141/75  Pulse: 89 86  Resp: 16 16  Temp: 98 F (36.7 C) 98.3 F (36.8 C)  SpO2: 93% 96%   Last BM Date: 06/28/21    GI:  Lab Results: Recent Labs    07/01/21 0213 07/02/21 0412  WBC 3.7* 3.4*  HGB 14.1 11.8*  HCT 45.3 37.3*  PLT 348 249   BMET Recent Labs    07/01/21 0213 07/02/21 0412  NA 134* 135  K 3.9 3.9  CL 101 105  CO2 23 23  GLUCOSE 100* 81  BUN 5* <5*  CREATININE 0.64 0.51*  CALCIUM 8.9 8.4*   LFT Recent Labs    07/02/21 0412  PROT 6.6  ALBUMIN 3.1*  AST 9*  ALT 10  ALKPHOS 64  BILITOT 1.4*   PT/INR No results for input(s): LABPROT, INR in the last 72 hours.   Studies/Results: CT Abdomen Pelvis W Contrast  Result Date: 07/01/2021 CLINICAL DATA:  Pancreatitis, persistent.  Abdominal pain EXAM: CT ABDOMEN AND PELVIS WITH CONTRAST TECHNIQUE: Multidetector CT imaging of the abdomen and pelvis was  performed using the standard protocol following bolus administration of intravenous contrast. CONTRAST:  67mL OMNIPAQUE IOHEXOL 350 MG/ML SOLN COMPARISON:  06/19/2021 FINDINGS: Lower chest: Pleural base masses are again seen in the left lower hemithorax. The superior larger. Largest nodule measures 2.9 cm. This was not as well visualized on prior study but approximate size 2.6 cm. Small left pleural effusion, slightly increased since prior study. Spiculated mass in the left lower lobe measures up to 2.3 cm compared to 2 cm previously. Multiple other lower lobe pulmonary nodules appear similar to prior study. Hepatobiliary: No focal hepatic abnormality. Gallbladder unremarkable. Pancreas: No focal abnormality or ductal dilatation. No surrounding inflammation. Spleen: No focal abnormality.  Normal size. Adrenals/Urinary Tract: Large heterogeneous partially necrotic mass again seen arising from the right kidney measures approximately 11 x 11 cm. This does not appear appreciably changed since prior study. Bilateral adrenal masses and solid left renal mass again noted, unchanged. No hydronephrosis. Suspect small calcification layering dependently in the bladder posteriorly. This measures approximately 2 mm. Stomach/Bowel: Stomach, large and small bowel grossly unremarkable. Vascular/Lymphatic: No evidence of aneurysm or adenopathy. Reproductive: No visible focal abnormality. Other: No free fluid or free air. Musculoskeletal: No acute bony abnormality. IMPRESSION: Large right necrotic renal mass again noted, not significantly changed. Stable solid left renal mass, bilateral adrenal metastases, and several pulmonary nodules visualized in the lower lungs. Numerous pleural based lesions in the left lower chest appears slightly more prominent than prior study with small left pleural effusion increasing slightly. 2 mm calcification layering dependently in the posterior bladder, presumably bladder stone. No hydronephrosis. No  acute findings. Electronically Signed   By: Rolm Baptise M.D.   On: 07/01/2021 05:12    Impression: Pancreatitis - Lipase: 382 - Hgb 11.8, MCV 73.6 - Normal LFTs - T. Bili 1.4 - Normal renal function - CT ab/pelvis with contrast 12/25: large right necrotic renal mass, unchanged. Bilateral adrenal masses. Calcification on bladder, normal pancreas (no duct dilation or surrounding inflammation).  Kidney cancer with metastasis  Hypertension  Plan: Will obtain triglycerides and IgG4 to evaluate for etiology of recurrent pancreatitis.  Continue clear liquid diet as tolerated Could possibly benefit from EGD evaluation later this week to evaluate for PUD as an etiology for elevated lipase. Continue IVF Continue pain management prn [facility] will follow    LOS: 1 day   Kallon Caylor Radford Pax  PA-C 07/02/2021, 9:15 AM  Contact #  [phone]

## 2021-07-02 NOTE — Progress Notes (Signed)
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  He was admitted a few weeks ago and was suspected to have pancreatitis in the setting of his chemotherapy.  Lipase was mildly elevated at that time and CT did not show any inflammatory changes.  His lipase trended down but then slightly up and his  abdominal pain improved.  He was discharged from the hospital in stable condition however came back to the ED on 06/27/2021 and work-up at that time was consistent with pancreatitis at that time.  He had run out of his pain medications at home and his pain was able to be controlled with a single dose of Dilaudid and oral pain medication.  He was able to tolerate oral intake and then was discharged and stable condition but then subsequently came back yesterday to the ED with persistent abdominal pain that radiated to the back as well as decreased appetite and mild nausea.  Vital signs were stable and he received 2 L normal same bolus and given hydromorphone in the ED.  Lipase went from 528 -> 382 -> 248.  Because of his recurrent abdominal pain and suspected recurrent pancreatitis GI was consulted and they feel the patient has acute pancreatitis based on abdominal pain and elevated lipase but negative inflammation on CT scan.  They do not think an MRCP is needed at this time and they recommended getting triglyceride and IgG4 level and they are going to evaluate with an EGD for peptic ulcer disease with elevated epigastric pain elevated lipase and he is going to be n.p.o. after midnight for EGD tomorrow.  If EGD is unrevealing then  we will consider MRCP of the pancreas to further evaluate for inflammation despite negative CT scans.   Assessment & Plan:   Principal Problem:   Intractable pain Active Problems:   Kidney cancer, primary, with metastasis from kidney to other site [facility])   Brain metastases (HCC)   Pancreatitis   Hypertension   Tobacco use   Acute pancreatitis  Abdominal Pain with concern for Recurrent Acute Pancreatitis  -Presented with crampy abdominal pain in the area of the epigastrium and umbilicus -He tried Tylenol and Tums but it did not help -Lipase level improving  -Currently getting lactated Ringer's 100 MLS per hour -CT Scan done and showed "Large right necrotic renal mass again noted, not significantly changed. Stable solid left renal mass, bilateral adrenal metastases,and several pulmonary nodules visualized in the lower lungs. Numerous pleural based lesions in the left lower chest appears slightly more prominent than prior study with small left pleural effusion increasing slightly. 2 mm calcification layering dependently in the posterior bladder, presumably bladder stone. No hydronephrosis. No acute findings." -Currently on a clear liquid diet but will be n.p.o. after midnight -Continue with pain control IV hydromorphone 1 mg every 3 h as needed severe pain -Continue with ondansetron 4 mg p.o./IV every 6 as needed nausea  -Continue with IV pantoprazole 40 mg q. 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 Isidore Moos that is scheduled -Follow-up with [name] in outpatient setting   Tobacco Abuse -Smoking Cessation Counseling given that he still smokes twice a week -If necessary will provide  the patient a Nicotine Patch   Hypertension -Continue amlodipine 5 mg p.o. daily -Continue monitor blood pressures per protocol -Last blood pressure reading was 142/81  Chronic Dyspnea -CT scan as above with improvement in his masses and decrease in the size and number of pulmonary nodules -Continue with fluticasone furoate-vilanterol 200-25 mg per actuation 1 puff IH daily   Leukopenia -Mild and patient's WBC went from 3.7 and then trended down to 3.4 -Continue to monitor and trend and repeat CBC in a.m.  DVT prophylaxis: SCDs Code Status: FULL CODE  Family Communication: No family present at bedside  Disposition Plan: Pending further clinical improvement and clearance by GI  Status is: Inpatient  Remains inpatient appropriate because: As recurrent pancreatitis that needs further work-up and GI involvement   Consultants:  Gastroenterology  Procedures: None  Antimicrobials:  Anti-infectives (From admission, onward)    None        Subjective: Seen and examined at bedside and still having some crampy abdominal pain and states that the pain had improved initially but now worsened again and continues to have pain.  Also has some nausea but no actual vomiting.  Denies any lightheadedness or dizziness.  No other concerns or complaints at this time.  Objective: Vitals:   07/02/21 0135 07/02/21 0542 07/02/21 0923 07/02/21 1227  BP: (!) 154/93 (!) 141/75 138/82 (!) 142/81  Pulse: 89 86 90 87  Resp: 16 16 16 16   Temp: 98 F (36.7 C) 98.3 F (36.8 C) 98.5 F (36.9 C) 97.9 F (36.6 C)  TempSrc: Oral Oral Oral   SpO2: 93% 96% 97% 99%  Weight:      Height:        Intake/Output Summary (Last 24 hours) at 07/02/2021 1832 Last data filed at 07/02/2021 1800 Gross per 24 hour  Intake 3087.55 ml  Output 0 ml  Net 3087.55 ml    Filed Weights   06/30/21 2221 06/30/21 2238  Weight: 78.9 kg 78.9 kg   Examination: Physical Exam:  Constitutional: WN/WD overweight male  in no acute distress appears calm but a little uncomfortable complaining of abdominal pain  Eyes: Lids and conjunctivae normal, sclerae anicteric  ENMT: External Ears, Nose appear normal. Grossly normal hearing.  Neck: Appears normal, supple, no cervical masses, normal ROM, no appreciable thyromegaly; no JVD Respiratory: Diminished to auscultation bilaterally, no wheezing, rales, rhonchi or crackles. Normal respiratory effort and patient is not tachypenic. No accessory muscle use.  Unlabored breathing Cardiovascular: RRR, no murmurs / rubs / gallops. S1 and S2 auscultated. Abdomen: Soft, a little-tender, distended secondary body habitus.  Bowel sounds positive.  GU: Deferred. Musculoskeletal: No clubbing / cyanosis of digits/nails. No joint deformity upper and lower extremities. Skin: No rashes, lesions, ulcers on limited skin evaluation. No induration; Warm and dry.  Neurologic: CN 2-12 grossly intact with no focal deficits. Romberg sign cerebellar reflexes not assessed.  Psychiatric: Normal judgment and insight. Alert and oriented x 3. Normal mood and appropriate affect.   Data Reviewed: I have personally reviewed following labs and imaging studies  CBC: Recent Labs  Lab 06/27/21 0014 07/01/21 0213 07/02/21 0412  WBC 4.6 3.7* 3.4*  NEUTROABS 2.5 1.9 1.7  HGB  14.2 14.1 11.8*  HCT 45.9 45.3 37.3*  MCV 73.0* 73.7* 73.6*  PLT 294 348 826    Basic Metabolic Panel: Recent Labs  Lab 06/27/21 0014 07/01/21 0213 07/02/21 0412  NA 134* 134* 135  K 3.9 3.9 3.9  CL 103 101 105  CO2 23 23 23   GLUCOSE 98 100* 81  BUN 7 5* <5*  CREATININE 0.52* 0.64 0.51*  CALCIUM 8.8* 8.9 8.4*  MG  --  2.0  --   PHOS  --  2.7  --     GFR: Estimated Creatinine Clearance: 112.9 mL/min (A) (by C-G formula based on SCr of 0.51 mg/dL (L)). Liver Function Tests: Recent Labs  Lab 06/27/21 0014 07/01/21 0213 07/02/21 0412  AST 16 13* 9*  ALT 17 12 10   ALKPHOS 94 83 64  BILITOT 0.7 1.3* 1.4*   PROT 8.1 8.4* 6.6  ALBUMIN 3.6 3.8 3.1*    Recent Labs  Lab 06/27/21 0014 07/01/21 0213 07/02/21 0412  LIPASE 528* 382* 248*    No results for input(s): AMMONIA in the last 168 hours. Coagulation Profile: No results for input(s): INR, PROTIME in the last 168 hours. Cardiac Enzymes: No results for input(s): CKTOTAL, CKMB, CKMBINDEX, TROPONINI in the last 168 hours. BNP (last 3 results) No results for input(s): PROBNP in the last 8760 hours. HbA1C: No results for input(s): HGBA1C in the last 72 hours. CBG: No results for input(s): GLUCAP in the last 168 hours. Lipid Profile: Recent Labs    07/02/21 1001  TRIG 60    Thyroid Function Tests: No results for input(s): TSH, T4TOTAL, FREET4, T3FREE, THYROIDAB in the last 72 hours. Anemia Panel: No results for input(s): VITAMINB12, FOLATE, FERRITIN, TIBC, IRON, RETICCTPCT in the last 72 hours. Sepsis Labs: No results for input(s): PROCALCITON, LATICACIDVEN in the last 168 hours.  Recent Results (from the past 240 hour(s))  Resp Panel by RT-PCR (Flu A&B, Covid) Nasopharyngeal Swab     Status: None   Collection Time: 06/27/21  2:05 AM   Specimen: Nasopharyngeal Swab; Nasopharyngeal(NP) swabs in vial transport medium  Result Value Ref Range Status   SARS Coronavirus 2 by RT PCR NEGATIVE NEGATIVE Final    Comment: (NOTE) SARS-CoV-2 target nucleic acids are NOT DETECTED.  The SARS-CoV-2 RNA is generally detectable in upper respiratory specimens during the acute phase of infection. The lowest concentration of SARS-CoV-2 viral copies this assay can detect is 138 copies/mL. A negative result does not preclude SARS-Cov-2 infection and should not be used as the sole basis for treatment or other patient management decisions. A negative result may occur with  improper specimen collection/handling, submission of specimen other than nasopharyngeal swab, presence of viral mutation(s) within the areas targeted by this assay, and  inadequate number of viral copies(<138 copies/mL). A negative result must be combined with clinical observations, patient history, and epidemiological information. The expected result is Negative.  Fact Sheet for Patients:  EntrepreneurPulse.com.au  Fact Sheet for Healthcare Providers:  IncredibleEmployment.be  This test is no t yet approved or cleared by the [place] FDA and  has been authorized for detection and/or diagnosis of SARS-CoV-2 by FDA under an Emergency Use Authorization (EUA). This EUA will remain  in effect (meaning this test can be used) for the duration of the COVID-19 declaration under Section 564(b)(1) of the Act, 21 U.S.C.section 360bbb-3(b)(1), unless the authorization is terminated  or revoked sooner.       Influenza A by PCR NEGATIVE NEGATIVE Final   Influenza B by  PCR NEGATIVE NEGATIVE Final    Comment: (NOTE) The Xpert Xpress SARS-CoV-2/FLU/RSV plus assay is intended as an aid in the diagnosis of influenza from Nasopharyngeal swab specimens and should not be used as a sole basis for treatment. Nasal washings and aspirates are unacceptable for Xpert Xpress SARS-CoV-2/FLU/RSV testing.  Fact Sheet for Patients: EntrepreneurPulse.com.au  Fact Sheet for Healthcare Providers: IncredibleEmployment.be  This test is not yet approved or cleared by the [place] FDA and has been authorized for detection and/or diagnosis of SARS-CoV-2 by FDA under an Emergency Use Authorization (EUA). This EUA will remain in effect (meaning this test can be used) for the duration of the COVID-19 declaration under Section 564(b)(1) of the Act, 21 U.S.C. section 360bbb-3(b)(1), unless the authorization is terminated or revoked.  Performed at [facility], [place] [address] [place], [place] [address]   Resp Panel by RT-PCR (Flu A&B, Covid) Nasopharyngeal Swab     Status: None    Collection Time: 07/01/21  6:15 AM   Specimen: Nasopharyngeal Swab; Nasopharyngeal(NP) swabs in vial transport medium  Result Value Ref Range Status   SARS Coronavirus 2 by RT PCR NEGATIVE NEGATIVE Final    Comment: (NOTE) SARS-CoV-2 target nucleic acids are NOT DETECTED.  The SARS-CoV-2 RNA is generally detectable in upper respiratory specimens during the acute phase of infection. The lowest concentration of SARS-CoV-2 viral copies this assay can detect is 138 copies/mL. A negative result does not preclude SARS-Cov-2 infection and should not be used as the sole basis for treatment or other patient management decisions. A negative result may occur with  improper specimen collection/handling, submission of specimen other than nasopharyngeal swab, presence of viral mutation(s) within the areas targeted by this assay, and inadequate number of viral copies(<138 copies/mL). A negative result must be combined with clinical observations, patient history, and epidemiological information. The expected result is Negative.  Fact Sheet for Patients:  EntrepreneurPulse.com.au  Fact Sheet for Healthcare Providers:  IncredibleEmployment.be  This test is no t yet approved or cleared by the [place] FDA and  has been authorized for detection and/or diagnosis of SARS-CoV-2 by FDA under an Emergency Use Authorization (EUA). This EUA will remain  in effect (meaning this test can be used) for the duration of the COVID-19 declaration under Section 564(b)(1) of the Act, 21 U.S.C.section 360bbb-3(b)(1), unless the authorization is terminated  or revoked sooner.       Influenza A by PCR NEGATIVE NEGATIVE Final   Influenza B by PCR NEGATIVE NEGATIVE Final    Comment: (NOTE) The Xpert Xpress SARS-CoV-2/FLU/RSV plus assay is intended as an aid in the diagnosis of influenza from Nasopharyngeal swab specimens and should not be used as a sole basis for treatment.  Nasal washings and aspirates are unacceptable for Xpert Xpress SARS-CoV-2/FLU/RSV testing.  Fact Sheet for Patients: EntrepreneurPulse.com.au  Fact Sheet for Healthcare Providers: IncredibleEmployment.be  This test is not yet approved or cleared by the [place] FDA and has been authorized for detection and/or diagnosis of SARS-CoV-2 by FDA under an Emergency Use Authorization (EUA). This EUA will remain in effect (meaning this test can be used) for the duration of the COVID-19 declaration under Section 564(b)(1) of the Act, 21 U.S.C. section 360bbb-3(b)(1), unless the authorization is terminated or revoked.  Performed at [facility], [place] [address] [place], [place] [address]      RN Pressure Injury Documentation:     Estimated body mass index is 25.7 kg/m as calculated from the following:  Height as of this encounter: 5\' 9"  (1.753 m).   Weight as of this encounter: 78.9 kg.  Malnutrition Type:   Malnutrition Characteristics:   Nutrition Interventions:    Radiology Studies: CT Abdomen Pelvis W Contrast  Result Date: 07/01/2021 CLINICAL DATA:  Pancreatitis, persistent.  Abdominal pain EXAM: CT ABDOMEN AND PELVIS WITH CONTRAST TECHNIQUE: Multidetector CT imaging of the abdomen and pelvis was performed using the standard protocol following bolus administration of intravenous contrast. CONTRAST:  36mL OMNIPAQUE IOHEXOL 350 MG/ML SOLN COMPARISON:  06/19/2021 FINDINGS: Lower chest: Pleural base masses are again seen in the left lower hemithorax. The superior larger. Largest nodule measures 2.9 cm. This was not as well visualized on prior study but approximate size 2.6 cm. Small left pleural effusion, slightly increased since prior study. Spiculated mass in the left lower lobe measures up to 2.3 cm compared to 2 cm previously. Multiple other lower lobe pulmonary nodules appear similar to prior study. Hepatobiliary: No  focal hepatic abnormality. Gallbladder unremarkable. Pancreas: No focal abnormality or ductal dilatation. No surrounding inflammation. Spleen: No focal abnormality.  Normal size. Adrenals/Urinary Tract: Large heterogeneous partially necrotic mass again seen arising from the right kidney measures approximately 11 x 11 cm. This does not appear appreciably changed since prior study. Bilateral adrenal masses and solid left renal mass again noted, unchanged. No hydronephrosis. Suspect small calcification layering dependently in the bladder posteriorly. This measures approximately 2 mm. Stomach/Bowel: Stomach, large and small bowel grossly unremarkable. Vascular/Lymphatic: No evidence of aneurysm or adenopathy. Reproductive: No visible focal abnormality. Other: No free fluid or free air. Musculoskeletal: No acute bony abnormality. IMPRESSION: Large right necrotic renal mass again noted, not significantly changed. Stable solid left renal mass, bilateral adrenal metastases, and several pulmonary nodules visualized in the lower lungs. Numerous pleural based lesions in the left lower chest appears slightly more prominent than prior study with small left pleural effusion increasing slightly. 2 mm calcification layering dependently in the posterior bladder, presumably bladder stone. No hydronephrosis. No acute findings. Electronically Signed   By: Rolm Baptise M.D.   On: 07/01/2021 05:12    Scheduled Meds:  amLODipine  5 mg Oral Daily   pantoprazole (PROTONIX) IV  40 mg Intravenous Q24H   Continuous Infusions:  lactated ringers 100 mL/hr at 07/02/21 1134    LOS: 1 day   Kerney Elbe, DO [facility] PAGER is on AMION  If 7PM-7AM, please contact night-coverage [email or web address]

## 2021-07-02 NOTE — Plan of Care (Signed)
  Problem: Clinical Measurements: Goal: Ability to maintain clinical measurements within normal limits will improve Outcome: Progressing   Problem: Activity: Goal: Risk for activity intolerance will decrease Outcome: Progressing   Problem: Pain Managment: Goal: General experience of comfort will improve Outcome: Progressing   

## 2021-07-03 ENCOUNTER — Inpatient Hospital Stay (HOSPITAL_COMMUNITY): Payer: Medicaid Other | Admitting: Anesthesiology

## 2021-07-03 ENCOUNTER — Inpatient Hospital Stay: Payer: Medicaid Other | Admitting: Oncology

## 2021-07-03 ENCOUNTER — Inpatient Hospital Stay: Payer: Medicaid Other

## 2021-07-03 ENCOUNTER — Other Ambulatory Visit (HOSPITAL_COMMUNITY): Payer: Self-pay

## 2021-07-03 ENCOUNTER — Encounter (HOSPITAL_COMMUNITY): Payer: Self-pay | Admitting: Emergency Medicine

## 2021-07-03 ENCOUNTER — Encounter (HOSPITAL_COMMUNITY)
Admission: EM | Disposition: A | Discharge status: Home / Self Care | Payer: Self-pay | Source: Home / Self Care | Attending: Internal Medicine

## 2021-07-03 DIAGNOSIS — I10 Essential (primary) hypertension: Secondary | ICD-10-CM | POA: Diagnosis not present

## 2021-07-03 DIAGNOSIS — C7931 Secondary malignant neoplasm of brain: Secondary | ICD-10-CM | POA: Diagnosis not present

## 2021-07-03 DIAGNOSIS — K859 Acute pancreatitis without necrosis or infection, unspecified: Secondary | ICD-10-CM | POA: Diagnosis not present

## 2021-07-03 DIAGNOSIS — R52 Pain, unspecified: Secondary | ICD-10-CM | POA: Diagnosis not present

## 2021-07-03 HISTORY — PX: BIOPSY: SHX5522

## 2021-07-03 HISTORY — PX: ESOPHAGOGASTRODUODENOSCOPY (EGD) WITH PROPOFOL: SHX5813

## 2021-07-03 LAB — CBC WITH DIFFERENTIAL/PLATELET
Abs Immature Granulocytes: 0.01 10*3/uL (ref 0.00–0.07)
Basophils Absolute: 0 10*3/uL (ref 0.0–0.1)
Basophils Relative: 1 %
Eosinophils Absolute: 0.2 10*3/uL (ref 0.0–0.5)
Eosinophils Relative: 4 %
HCT: 36.2 % — ABNORMAL LOW (ref 39.0–52.0)
Hemoglobin: 11.5 g/dL — ABNORMAL LOW (ref 13.0–17.0)
Immature Granulocytes: 0 %
Lymphocytes Relative: 32 %
Lymphs Abs: 1.2 10*3/uL (ref 0.7–4.0)
MCH: 23.5 pg — ABNORMAL LOW (ref 26.0–34.0)
MCHC: 31.8 g/dL (ref 30.0–36.0)
MCV: 73.9 fL — ABNORMAL LOW (ref 80.0–100.0)
Monocytes Absolute: 0.5 10*3/uL (ref 0.1–1.0)
Monocytes Relative: 12 %
Neutro Abs: 2 10*3/uL (ref 1.7–7.7)
Neutrophils Relative %: 51 %
Platelets: 254 10*3/uL (ref 150–400)
RBC: 4.9 MIL/uL (ref 4.22–5.81)
RDW: 22.5 % — ABNORMAL HIGH (ref 11.5–15.5)
WBC: 3.9 10*3/uL — ABNORMAL LOW (ref 4.0–10.5)
nRBC: 0 % (ref 0.0–0.2)

## 2021-07-03 LAB — COMPREHENSIVE METABOLIC PANEL
ALT: 8 U/L (ref 0–44)
AST: 11 U/L — ABNORMAL LOW (ref 15–41)
Albumin: 3.2 g/dL — ABNORMAL LOW (ref 3.5–5.0)
Alkaline Phosphatase: 61 U/L (ref 38–126)
Anion gap: 7 (ref 5–15)
BUN: <5 mg/dL — ABNORMAL LOW (ref 6–20)
CO2: 22 mmol/L (ref 22–32)
Calcium: 8.6 mg/dL — ABNORMAL LOW (ref 8.9–10.3)
Chloride: 106 mmol/L (ref 98–111)
Creatinine, Ser: 0.69 mg/dL (ref 0.61–1.24)
GFR, Estimated: >60 mL/min (ref >60)
Glucose, Bld: 94 mg/dL (ref 70–99)
Potassium: 3.8 mmol/L (ref 3.5–5.1)
Sodium: 135 mmol/L (ref 135–145)
Total Bilirubin: 1.2 mg/dL (ref 0.3–1.2)
Total Protein: 7.1 g/dL (ref 6.5–8.1)

## 2021-07-03 LAB — PHOSPHORUS: Phosphorus: 2.4 mg/dL — ABNORMAL LOW (ref 2.5–4.6)

## 2021-07-03 LAB — IGG 4: IgG, Subclass 4: 29 mg/dL (ref 2–96)

## 2021-07-03 LAB — MAGNESIUM: Magnesium: 1.7 mg/dL (ref 1.7–2.4)

## 2021-07-03 LAB — LIPASE, BLOOD: Lipase: 309 U/L — ABNORMAL HIGH (ref 11–51)

## 2021-07-03 SURGERY — ESOPHAGOGASTRODUODENOSCOPY (EGD) WITH PROPOFOL
Anesthesia: Monitor Anesthesia Care

## 2021-07-03 MED ORDER — PROPOFOL 500 MG/50ML IV EMUL
INTRAVENOUS | Status: DC | PRN
  Administered 2021-07-03: 125 ug/kg/min via INTRAVENOUS

## 2021-07-03 MED ORDER — LIDOCAINE 2% (20 MG/ML) 5 ML SYRINGE
INTRAMUSCULAR | Status: DC | PRN
  Administered 2021-07-03: 80 mg via INTRAVENOUS

## 2021-07-03 MED ORDER — PROPOFOL 10 MG/ML IV BOLUS
INTRAVENOUS | Status: DC | PRN
  Administered 2021-07-03: 40 mg via INTRAVENOUS

## 2021-07-03 MED ORDER — K PHOS MONO-SOD PHOS DI & MONO 155-852-130 MG PO TABS
500.0000 mg | ORAL_TABLET | Freq: Once | ORAL | Status: AC
  Administered 2021-07-03: 17:00:00 500 mg via ORAL
  Filled 2021-07-03: qty 2

## 2021-07-03 MED ORDER — SODIUM CHLORIDE 0.9 % IV SOLN: INTRAVENOUS | Status: DC

## 2021-07-03 MED ORDER — LACTATED RINGERS IV SOLN: INTRAVENOUS | Status: DC

## 2021-07-03 MED ORDER — MAGNESIUM SULFATE 2 GM/50ML IV SOLN
2.0000 g | Freq: Once | INTRAVENOUS | Status: AC
  Administered 2021-07-03: 15:00:00 2 g via INTRAVENOUS
  Filled 2021-07-03: qty 50

## 2021-07-03 SURGICAL SUPPLY — 15 items

## 2021-07-03 NOTE — Anesthesia Preprocedure Evaluation (Addendum)
Anesthesia Evaluation  Patient identified by MRN, date of birth, ID band Patient awake    Reviewed: Allergy & Precautions, NPO status , Patient's Chart, lab work & pertinent test results  Airway Mallampati: I  TM Distance: >3 FB Neck ROM: Full    Dental no notable dental hx. (+) Teeth Intact, Dental Advisory Given   Pulmonary Current Smoker and Patient abstained from smoking.,    Pulmonary exam normal breath sounds clear to auscultation       Cardiovascular hypertension, Pt. on medications negative cardio ROS Normal cardiovascular exam Rhythm:Regular Rate:Normal     Neuro/Psych negative neurological ROS  negative psych ROS   GI/Hepatic negative GI ROS, Neg liver ROS,   Endo/Other  negative endocrine ROS  Renal/GU Renal disease  negative genitourinary   Musculoskeletal negative musculoskeletal ROS (+)   Abdominal   Peds negative pediatric ROS (+)  Hematology negative hematology ROS (+)   Anesthesia Other Findings  HPI: Dylan Good is a 50 y.o. 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  He has been occasionally mildly nauseous and his appetite has been significantly decreased, but no emesis, diarrhea, constipation, melena or hematochezia.    Reproductive/Obstetrics negative OB ROS                            Anesthesia Physical Anesthesia Plan  ASA: 3 and emergent  Anesthesia Plan: MAC   Post-op Pain Management: Minimal or no pain anticipated   Induction: Intravenous  PONV Risk Score and Plan: 2  Airway Management Planned: Mask and Natural Airway  Additional Equipment: None  Intra-op Plan:   Post-operative Plan:    Informed Consent:   Plan Discussed with: Anesthesiologist and CRNA  Anesthesia Plan Comments:        Anesthesia Quick Evaluation

## 2021-07-03 NOTE — Op Note (Signed)
[facility] Patient Name: Dylan Good Procedure Date: 07/03/2021 MRN: [id number] Attending MD: Ronnette Juniper , MD Date of Birth: 20-Nov-1970 CSN: [id number] Age: 51 Admit Type: Inpatient Procedure:                Upper GI endoscopy Indications:              Epigastric abdominal pain, Nausea with vomiting Providers:                Ronnette Juniper, MD, Burtis Junes, RN, Tyna Jaksch                            Technician Referring MD:             [facility] Medicines:                Monitored Anesthesia Care Complications:            No immediate complications. Estimated blood loss:                            Minimal. Estimated Blood Loss:     Estimated blood loss was minimal. Procedure:                Pre-Anesthesia Assessment:                           - Prior to the procedure, a History and Physical                            was performed, and patient medications and                            allergies were reviewed. The patient's tolerance of                            previous anesthesia was also reviewed. The risks                            and benefits of the procedure and the sedation                            options and risks were discussed with the patient.                            All questions were answered, and informed consent                            was obtained. Prior Anticoagulants: The patient has                            taken no previous anticoagulant or antiplatelet                            agents. ASA Grade Assessment: III - A patient with  severe systemic disease. After reviewing the risks                            and benefits, the patient was deemed in                            satisfactory condition to undergo the procedure.                           After obtaining informed consent, the endoscope was                            passed under direct vision. Throughout the                             procedure, the patient's blood pressure, pulse, and                            oxygen saturations were monitored continuously. The                            [id number] ([id number] Olympus therapeutic endoscope                            was introduced through the mouth, and advanced to                            the second part of duodenum. The upper GI endoscopy                            was accomplished without difficulty. The patient                            tolerated the procedure well. Scope In: Scope Out: Findings:      The examined esophagus was normal.      The Z-line was regular and was found 45 cm from the incisors.      The entire examined stomach was normal. Biopsies were taken with a cold       forceps for Helicobacter pylori testing.      The cardia and gastric fundus were normal on retroflexion.      The examined duodenum was normal. Looping was encountered during passage       of scope from duodenal bulb to descending duodenum, applying abdominal       pressure helped to advance the scope further into the second portion of       duodenum. Impression:               - Normal esophagus.                           - Z-line regular, 45 cm from the incisors.                           - Normal stomach. Biopsied.                           -  Normal examined duodenum. Moderate Sedation:      Patient did not receive moderate sedation for this procedure, but       instead received monitored anesthesia care. Recommendation:           - Advance diet as tolerated.                           - Continue present medications.                           - Await pathology results. Procedure Code(s):        --- Professional ---                           [phone], Esophagogastroduodenoscopy, flexible,                            transoral; with biopsy, single or multiple Diagnosis Code(s):        --- Professional ---                           R10.13, Epigastric pain                            R11.2, Nausea with vomiting, unspecified CPT copyright 2019 [employer]. All rights reserved. The codes documented in this report are preliminary and upon coder review may  be revised to meet current compliance requirements. Ronnette Juniper, MD 07/03/2021 1:16:10 PM This report has been signed electronically. Number of Addenda: 0

## 2021-07-03 NOTE — Progress Notes (Signed)
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  He was admitted a few weeks ago and was suspected to have pancreatitis in the setting of his chemotherapy.  Lipase was mildly elevated at that time and CT did not show any inflammatory changes.  His lipase trended down but then slightly up and his  abdominal pain improved.  He was discharged from the hospital in stable condition however came back to the ED on 06/27/2021 and work-up at that time was consistent with pancreatitis at that time.  He had run out of his pain medications at home and his pain was able to be controlled with a single dose of Dilaudid and oral pain medication.  He was able to tolerate oral intake and then was discharged and stable condition but then subsequently came back yesterday to the ED with persistent abdominal pain that radiated to the back as well as decreased appetite and mild nausea.  Vital signs were stable and he received 2 L normal same bolus and given hydromorphone in the ED.  Lipase went from 528 -> 382 -> 248.  Because of his recurrent abdominal pain and suspected recurrent pancreatitis GI was consulted and they feel the patient has acute pancreatitis based on abdominal pain and elevated lipase but negative inflammation on CT scan.  They do not think an MRCP is needed at this time and they recommended getting triglyceride and IgG4 level and they are going to evaluate with an EGD for peptic ulcer disease with elevated epigastric pain elevated lipase and he is going to be n.p.o. after midnight for EGD tomorrow.  If EGD is unrevealing then  we will consider MRCP of the pancreas to further evaluate for inflammation despite negative CT scans.   Patient thinks his abdominal pain is improving but still persists.  Awaiting EGD today.   Assessment & Plan:   Principal Problem:   Intractable pain Active Problems:   Kidney cancer, primary, with metastasis from kidney to other site [facility])   Brain metastases (HCC)   Pancreatitis   Hypertension   Tobacco use   Acute pancreatitis  Abdominal Pain with concern for Recurrent Acute Pancreatitis  -Presented with crampy abdominal pain in the area of the epigastrium and umbilicus -He tried Tylenol and Tums but it did not help -Lipase level was initially improving but now went from 248 and is now 309; on presentation is 382 -Currently getting lactated Ringer's 100 MLS per hour -CT Scan done and showed "Large right necrotic renal mass again noted, not significantly changed. Stable solid left renal mass, bilateral adrenal metastases,and several pulmonary nodules visualized in the lower lungs. Numerous pleural based lesions in the left lower chest appears slightly more prominent than prior study with small left pleural effusion increasing slightly. 2 mm calcification layering dependently in the posterior bladder, presumably bladder stone. No hydronephrosis. No acute findings." -Currently npo for EGD today  -Continue with pain control IV hydromorphone 1 mg every 3 h as needed severe pain -Continue with ondansetron 4 mg p.o./IV every 6 as needed nausea  -Continue with IV pantoprazole 40 mg q. 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 Isidore Moos that is scheduled -Follow-up with [name] in  outpatient setting and [name] aware patient is rehospitalized    Tobacco Abuse -Smoking Cessation Counseling given that he still smokes twice a week -If necessary will provide the patient a Nicotine Patch  Hyperbilirubinemia -Mild and improved.  Patient's Bilirubin went from 1.3 -> 1.4 -> 1.2 -Continue to monitor and trend and repeat CMP in a.m.  Hypophosphatemia -Patient's phosphorus level was 2.4 this morning and will replete with p.o. K-Phos Neutral 500 mg x 1 -Continue to monitor and replete as necessary -Repeat Phos Level in the AM   Hypertension -Continue amlodipine 5 mg p.o. daily -Continue monitor blood pressures per protocol -Last blood pressure reading was 149/89  Chronic Dyspnea -CT scan as above with improvement in his masses and decrease in the size and number of pulmonary nodules -Continue with fluticasone furoate-vilanterol 200-25 mg per actuation 1 puff IH daily   Leukopenia -Mild and patient's WBC went from 3.7 and then trended down to 3.4 -> 3.9 -Continue to monitor and trend and repeat CBC in a.m.  Normocytic Anemia -Patient's Hgb/Hct went from 11.8/37.3 -> 11.5/36.2 -Check Anemia Panel in the AM -Continue to Monitor for S/Sx of Bleeding -No overt bleeding noted  DVT prophylaxis: SCDs Code Status: FULL CODE  Family Communication: No family present at bedside  Disposition Plan: Pending further clinical improvement and clearance by GI  Status is: Inpatient  Remains inpatient appropriate because: As recurrent pancreatitis that needs further work-up and GI involvement   Consultants:  Gastroenterology Notified his Primary Oncologist [name] of his Hospitalization  Procedures:  EGD  Antimicrobials:  Anti-infectives (From admission, onward)    None        Subjective: Seen and examined at bedside and still having some abdominal pain but this is a little bit better.  No nausea or vomiting.  Awaiting his EGD.  Feels okay.  No other concerns  or complaints at this time.  Objective: Vitals:   07/02/21 1227 07/02/21 2121 07/03/21 0545 07/03/21 1154  BP: (!) 142/81 (!) 147/86 132/81 (!) 149/89  Pulse: 87 82 82 73  Resp: 16 18 18 18   Temp: 97.9 F (36.6 C) 98.3 F (36.8 C) 98.3 F (36.8 C) 98.2 F (36.8 C)  TempSrc:  Oral Oral Oral  SpO2: 99% 96% 96% 99%  Weight:    78.9 kg  Height:    5\' 9"  (1.753 m)    Intake/Output Summary (Last 24 hours) at 07/03/2021 1252 Last data filed at 07/03/2021 1001 Gross per 24 hour  Intake 3109.7 ml  Output --  Net 3109.7 ml    Filed Weights   06/30/21 2221 06/30/21 2238 07/03/21 1154  Weight: 78.9 kg 78.9 kg 78.9 kg   Examination: Physical Exam:  Constitutional: WN/WD overweight male in NAD appears calm and a little bit better Eyes: Lids and conjunctivae normal, sclerae anicteric  ENMT: External Ears, Nose appear normal. Grossly normal hearing. Mucous membranes are moist.  Neck: Appears normal, supple, no cervical masses, normal ROM, no appreciable thyromegaly; no appreciable JVD Respiratory: Diminished to auscultation bilaterally, no wheezing, rales, rhonchi or crackles. Normal respiratory effort and patient is not tachypenic. No accessory muscle use.  Unlabored breathing Cardiovascular: RRR, no  murmurs / rubs / gallops. S1 and S2 auscultated.  No appreciable lower extremity edema. Abdomen: Soft, tender to palpate, non-distended. hepatosplenomegaly. Bowel sounds positive.  GU: Deferred. Musculoskeletal: No clubbing / cyanosis of digits/nails. No joint deformity upper and lower extremities.  Skin: No rashes, lesions, ulcers on limited skin evaluation. No induration; Warm and dry.  Neurologic: CN 2-12 grossly intact with no focal deficits. Romberg sign and cerebellar reflexes not assessed.  Psychiatric: Normal judgment and insight. Alert and oriented x 3. Normal mood and appropriate affect.   Data Reviewed: I have personally reviewed following labs and imaging  studies  CBC: Recent Labs  Lab 06/27/21 0014 07/01/21 0213 07/02/21 0412 07/03/21 0422  WBC 4.6 3.7* 3.4* 3.9*  NEUTROABS 2.5 1.9 1.7 2.0  HGB 14.2 14.1 11.8* 11.5*  HCT 45.9 45.3 37.3* 36.2*  MCV 73.0* 73.7* 73.6* 73.9*  PLT 294 348 249 503    Basic Metabolic Panel: Recent Labs  Lab 06/27/21 0014 07/01/21 0213 07/02/21 0412 07/03/21 0422  NA 134* 134* 135 135  K 3.9 3.9 3.9 3.8  CL 103 101 105 106  CO2 23 23 23 22   GLUCOSE 98 100* 81 94  BUN 7 5* <5* <5*  CREATININE 0.52* 0.64 0.51* 0.69  CALCIUM 8.8* 8.9 8.4* 8.6*  MG  --  2.0  --  1.7  PHOS  --  2.7  --  2.4*    GFR: Estimated Creatinine Clearance: 112.9 mL/min (by C-G formula based on SCr of 0.69 mg/dL). Liver Function Tests: Recent Labs  Lab 06/27/21 0014 07/01/21 0213 07/02/21 0412 07/03/21 0422  AST 16 13* 9* 11*  ALT 17 12 10 8   ALKPHOS 94 83 64 61  BILITOT 0.7 1.3* 1.4* 1.2  PROT 8.1 8.4* 6.6 7.1  ALBUMIN 3.6 3.8 3.1* 3.2*    Recent Labs  Lab 06/27/21 0014 07/01/21 0213 07/02/21 0412 07/03/21 0422  LIPASE 528* 382* 248* 309*    No results for input(s): AMMONIA in the last 168 hours. Coagulation Profile: No results for input(s): INR, PROTIME in the last 168 hours. Cardiac Enzymes: No results for input(s): CKTOTAL, CKMB, CKMBINDEX, TROPONINI in the last 168 hours. BNP (last 3 results) No results for input(s): PROBNP in the last 8760 hours. HbA1C: No results for input(s): HGBA1C in the last 72 hours. CBG: No results for input(s): GLUCAP in the last 168 hours. Lipid Profile: Recent Labs    07/02/21 1001  TRIG 60    Thyroid Function Tests: No results for input(s): TSH, T4TOTAL, FREET4, T3FREE, THYROIDAB in the last 72 hours. Anemia Panel: No results for input(s): VITAMINB12, FOLATE, FERRITIN, TIBC, IRON, RETICCTPCT in the last 72 hours. Sepsis Labs: No results for input(s): PROCALCITON, LATICACIDVEN in the last 168 hours.  Recent Results (from the past 240 hour(s))  Resp  Panel by RT-PCR (Flu A&B, Covid) Nasopharyngeal Swab     Status: None   Collection Time: 06/27/21  2:05 AM   Specimen: Nasopharyngeal Swab; Nasopharyngeal(NP) swabs in vial transport medium  Result Value Ref Range Status   SARS Coronavirus 2 by RT PCR NEGATIVE NEGATIVE Final    Comment: (NOTE) SARS-CoV-2 target nucleic acids are NOT DETECTED.  The SARS-CoV-2 RNA is generally detectable in upper respiratory specimens during the acute phase of infection. The lowest concentration of SARS-CoV-2 viral copies this assay can detect is 138 copies/mL. A negative result does not preclude SARS-Cov-2 infection and should not be used as the sole basis for treatment or other patient management decisions. A negative result  may occur with  improper specimen collection/handling, submission of specimen other than nasopharyngeal swab, presence of viral mutation(s) within the areas targeted by this assay, and inadequate number of viral copies(<138 copies/mL). A negative result must be combined with clinical observations, patient history, and epidemiological information. The expected result is Negative.  Fact Sheet for Patients:  EntrepreneurPulse.com.au  Fact Sheet for Healthcare Providers:  IncredibleEmployment.be  This test is no t yet approved or cleared by the [place] FDA and  has been authorized for detection and/or diagnosis of SARS-CoV-2 by FDA under an Emergency Use Authorization (EUA). This EUA will remain  in effect (meaning this test can be used) for the duration of the COVID-19 declaration under Section 564(b)(1) of the Act, 21 U.S.C.section 360bbb-3(b)(1), unless the authorization is terminated  or revoked sooner.       Influenza A by PCR NEGATIVE NEGATIVE Final   Influenza B by PCR NEGATIVE NEGATIVE Final    Comment: (NOTE) The Xpert Xpress SARS-CoV-2/FLU/RSV plus assay is intended as an aid in the diagnosis of influenza from Nasopharyngeal  swab specimens and should not be used as a sole basis for treatment. Nasal washings and aspirates are unacceptable for Xpert Xpress SARS-CoV-2/FLU/RSV testing.  Fact Sheet for Patients: EntrepreneurPulse.com.au  Fact Sheet for Healthcare Providers: IncredibleEmployment.be  This test is not yet approved or cleared by the [place] FDA and has been authorized for detection and/or diagnosis of SARS-CoV-2 by FDA under an Emergency Use Authorization (EUA). This EUA will remain in effect (meaning this test can be used) for the duration of the COVID-19 declaration under Section 564(b)(1) of the Act, 21 U.S.C. section 360bbb-3(b)(1), unless the authorization is terminated or revoked.  Performed at [facility], [place] [address] [place], [place] [address]   Resp Panel by RT-PCR (Flu A&B, Covid) Nasopharyngeal Swab     Status: None   Collection Time: 07/01/21  6:15 AM   Specimen: Nasopharyngeal Swab; Nasopharyngeal(NP) swabs in vial transport medium  Result Value Ref Range Status   SARS Coronavirus 2 by RT PCR NEGATIVE NEGATIVE Final    Comment: (NOTE) SARS-CoV-2 target nucleic acids are NOT DETECTED.  The SARS-CoV-2 RNA is generally detectable in upper respiratory specimens during the acute phase of infection. The lowest concentration of SARS-CoV-2 viral copies this assay can detect is 138 copies/mL. A negative result does not preclude SARS-Cov-2 infection and should not be used as the sole basis for treatment or other patient management decisions. A negative result may occur with  improper specimen collection/handling, submission of specimen other than nasopharyngeal swab, presence of viral mutation(s) within the areas targeted by this assay, and inadequate number of viral copies(<138 copies/mL). A negative result must be combined with clinical observations, patient history, and epidemiological information. The expected result  is Negative.  Fact Sheet for Patients:  EntrepreneurPulse.com.au  Fact Sheet for Healthcare Providers:  IncredibleEmployment.be  This test is no t yet approved or cleared by the [place] FDA and  has been authorized for detection and/or diagnosis of SARS-CoV-2 by FDA under an Emergency Use Authorization (EUA). This EUA will remain  in effect (meaning this test can be used) for the duration of the COVID-19 declaration under Section 564(b)(1) of the Act, 21 U.S.C.section 360bbb-3(b)(1), unless the authorization is terminated  or revoked sooner.       Influenza A by PCR NEGATIVE NEGATIVE Final   Influenza B by PCR NEGATIVE NEGATIVE Final    Comment: (NOTE) The Xpert Xpress SARS-CoV-2/FLU/RSV plus assay is intended  as an aid in the diagnosis of influenza from Nasopharyngeal swab specimens and should not be used as a sole basis for treatment. Nasal washings and aspirates are unacceptable for Xpert Xpress SARS-CoV-2/FLU/RSV testing.  Fact Sheet for Patients: EntrepreneurPulse.com.au  Fact Sheet for Healthcare Providers: IncredibleEmployment.be  This test is not yet approved or cleared by the [place] FDA and has been authorized for detection and/or diagnosis of SARS-CoV-2 by FDA under an Emergency Use Authorization (EUA). This EUA will remain in effect (meaning this test can be used) for the duration of the COVID-19 declaration under Section 564(b)(1) of the Act, 21 U.S.C. section 360bbb-3(b)(1), unless the authorization is terminated or revoked.  Performed at [facility], [place] [address], [place], [place] [address]      RN Pressure Injury Documentation:     Estimated body mass index is 25.69 kg/m as calculated from the following:   Height as of this encounter: 5\' 9"  (1.753 m).   Weight as of this encounter: 78.9 kg.  Malnutrition Type:   Malnutrition Characteristics:    Nutrition Interventions:    Radiology Studies: No results found.  Scheduled Meds:  [MAR Hold] amLODipine  5 mg Oral Daily   [MAR Hold] pantoprazole (PROTONIX) IV  40 mg Intravenous Q24H   Continuous Infusions:  sodium chloride     lactated ringers 100 mL/hr at 07/03/21 1250   lactated ringers 10 mL/hr at 07/03/21 1250    LOS: 2 days   Kerney Elbe, DO [facility] PAGER is on [place]  If 7PM-7AM, please contact night-coverage [email or web address]

## 2021-07-03 NOTE — Transfer of Care (Signed)
Immediate Anesthesia Transfer of Care Note  Patient: Dylan Good  Procedure(s) Performed: ESOPHAGOGASTRODUODENOSCOPY (EGD) WITH PROPOFOL BIOPSY  Patient Location: Endoscopy Unit  Anesthesia Type:MAC  Level of Consciousness: drowsy and patient cooperative  Airway & Oxygen Therapy: Patient Spontanous Breathing and Patient connected to face mask oxygen  Post-op Assessment: Report given to RN and Post -op Vital signs reviewed and stable  Post vital signs: Reviewed and stable  Last Vitals:  Vitals Value Taken Time  BP 86/48 07/03/21 1320  Temp    Pulse 75 07/03/21 1320  Resp 14 07/03/21 1320  SpO2 99 % 07/03/21 1320  Vitals shown include unvalidated device data.  Last Pain:  Vitals:   07/03/21 1154  TempSrc: Oral  PainSc: 0-No pain      Patients Stated Pain Goal: 4 (17/40/81 4481)  Complications: No notable events documented.

## 2021-07-03 NOTE — Anesthesia Postprocedure Evaluation (Signed)
Anesthesia Post Note  Patient: ELIEZER KHAWAJA  Procedure(s) Performed: ESOPHAGOGASTRODUODENOSCOPY (EGD) WITH PROPOFOL BIOPSY     Patient location during evaluation: PACU Anesthesia Type: MAC Level of consciousness: awake and alert Pain management: pain level controlled Vital Signs Assessment: post-procedure vital signs reviewed and stable Respiratory status: spontaneous breathing, nonlabored ventilation, respiratory function stable and patient connected to nasal cannula oxygen Cardiovascular status: stable and blood pressure returned to baseline Postop Assessment: no apparent nausea or vomiting Anesthetic complications: no   No notable events documented.  Last Vitals:  Vitals:   07/03/21 1322 07/03/21 1331  BP: (!) 98/59 134/81  Pulse: 81 86  Resp: 13 16  Temp:    SpO2: 100% 98%    Last Pain:  Vitals:   07/03/21 1331  TempSrc:   PainSc: 0-No pain                 Blonnie Maske

## 2021-07-03 NOTE — Interval H&P Note (Signed)
History and Physical Interval Note: 50/male with metastatic renal cell cancer with nausea, vomiting and abdominal pain and elevated lipase for an EGD with propofol.  07/03/2021 12:24 PM  Dylan Good  has presented today for EGD, with the diagnosis of abd pain.  The various methods of treatment have been discussed with the patient and family. After consideration of risks, benefits and other options for treatment, the patient has consented to  Procedure(s): ESOPHAGOGASTRODUODENOSCOPY (EGD) WITH PROPOFOL (N/A) as a surgical intervention.  The patient's history has been reviewed, patient examined, no change in status, stable for surgery.  I have reviewed the patient's chart and labs.  Questions were answered to the patient's satisfaction.     Ronnette Juniper

## 2021-07-04 ENCOUNTER — Inpatient Hospital Stay (HOSPITAL_COMMUNITY): Payer: Medicaid Other

## 2021-07-04 ENCOUNTER — Encounter (HOSPITAL_COMMUNITY): Payer: Self-pay | Admitting: Gastroenterology

## 2021-07-04 ENCOUNTER — Ambulatory Visit: Payer: Medicaid Other | Admitting: Oncology

## 2021-07-04 ENCOUNTER — Ambulatory Visit: Payer: Medicaid Other

## 2021-07-04 ENCOUNTER — Other Ambulatory Visit: Payer: Medicaid Other

## 2021-07-04 DIAGNOSIS — I10 Essential (primary) hypertension: Secondary | ICD-10-CM

## 2021-07-04 DIAGNOSIS — K859 Acute pancreatitis without necrosis or infection, unspecified: Secondary | ICD-10-CM | POA: Diagnosis not present

## 2021-07-04 DIAGNOSIS — R52 Pain, unspecified: Secondary | ICD-10-CM | POA: Diagnosis not present

## 2021-07-04 LAB — CBC WITH DIFFERENTIAL/PLATELET
Abs Immature Granulocytes: 0.01 10*3/uL (ref 0.00–0.07)
Basophils Absolute: 0 10*3/uL (ref 0.0–0.1)
Basophils Relative: 1 %
Eosinophils Absolute: 0.2 10*3/uL (ref 0.0–0.5)
Eosinophils Relative: 5 %
HCT: 35.6 % — ABNORMAL LOW (ref 39.0–52.0)
Hemoglobin: 11.2 g/dL — ABNORMAL LOW (ref 13.0–17.0)
Immature Granulocytes: 0 %
Lymphocytes Relative: 27 %
Lymphs Abs: 1 10*3/uL (ref 0.7–4.0)
MCH: 23.1 pg — ABNORMAL LOW (ref 26.0–34.0)
MCHC: 31.5 g/dL (ref 30.0–36.0)
MCV: 73.6 fL — ABNORMAL LOW (ref 80.0–100.0)
Monocytes Absolute: 0.5 10*3/uL (ref 0.1–1.0)
Monocytes Relative: 13 %
Neutro Abs: 2 10*3/uL (ref 1.7–7.7)
Neutrophils Relative %: 54 %
Platelets: 263 10*3/uL (ref 150–400)
RBC: 4.84 MIL/uL (ref 4.22–5.81)
RDW: 22.7 % — ABNORMAL HIGH (ref 11.5–15.5)
WBC: 3.8 10*3/uL — ABNORMAL LOW (ref 4.0–10.5)
nRBC: 0 % (ref 0.0–0.2)

## 2021-07-04 LAB — LIPASE, BLOOD: Lipase: 369 U/L — ABNORMAL HIGH (ref 11–51)

## 2021-07-04 IMAGING — MR MR ABDOMEN WO/W CM MRCP
17 of 22 series · 35 of 48 positions shown · IV contrast (GADAVIST)
Comparison: CT AP [DATE]

CLINICAL DATA: Elevated lipase. Evaluate for pancreas divisum
anatomy and pancreatic inflammation. History of metastatic renal
cell carcinoma

EXAM:
MRI ABDOMEN WITHOUT AND WITH CONTRAST (INCLUDING MRCP)
TECHNIQUE: Multiplanar multisequence MR imaging of the abdomen was performed
both before and after the administration of intravenous contrast.
Heavily T2-weighted images of the biliary and pancreatic ducts were
obtained, and three-dimensional MRCP images were rendered by post
processing.
CONTRAST:  8.5mL GADAVIST GADOBUTROL 1 MMOL/ML IV SOLN

[Series 4: T2 fat-sat · axial · 6.0mm · 1.25mm/px · 1 of 36 slices shown]
[im 1/36]
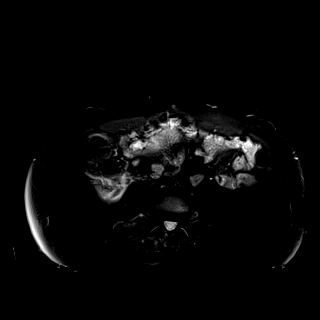

[Series 7: T2 · coronal · 6.0mm · 1.64mm/px · 1 of 30 slices shown (1 of 2)]
[im 1/30]
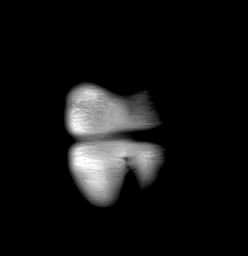

[Series 9: DWI · axial · 6.0mm · 1.42mm/px · z∈[-91,+193]mm · 2 of 72 slices shown (1 of 2)]
[im 1/72]
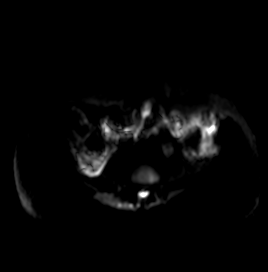
[im 72/72]
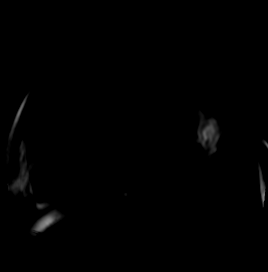

[Series 10: DWI · axial · 6.0mm · 1.42mm/px · 1 of 36 slices shown (2 of 2)]
[im 1/36]
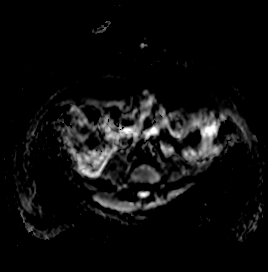

[Series 11: T1 · axial · 4.0mm · 1.25mm/px · z∈[-93,+191]mm · 2 of 72 slices shown (1 of 2)]
[im 1/72]
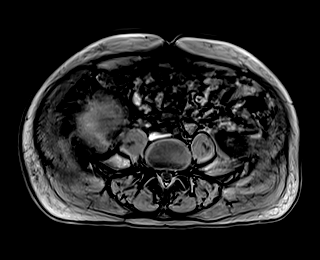
[im 72/72]
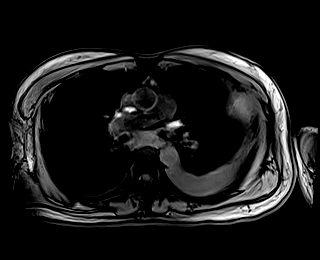

[Series 12: T1 · axial · 4.0mm · 1.25mm/px · z∈[-93,+191]mm · 2 of 72 slices shown (2 of 2)]
[im 1/72]
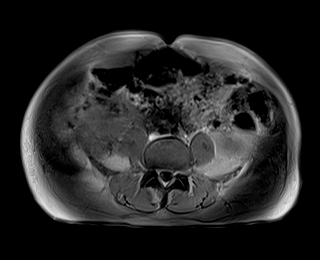
[im 72/72]
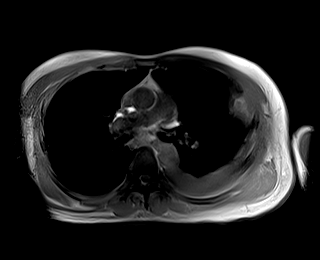

[Series 13: cor obl thk · sagittal · 50.0mm · 0.78mm/px · 1 of 9 slices shown]
[im 1/9]
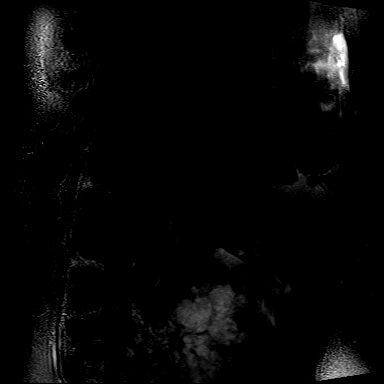

[Series 16: cor_3d_spc_trig · coronal · 1.0mm · 0.49mm/px · 3 of 88 slices shown]
[im 1/88]
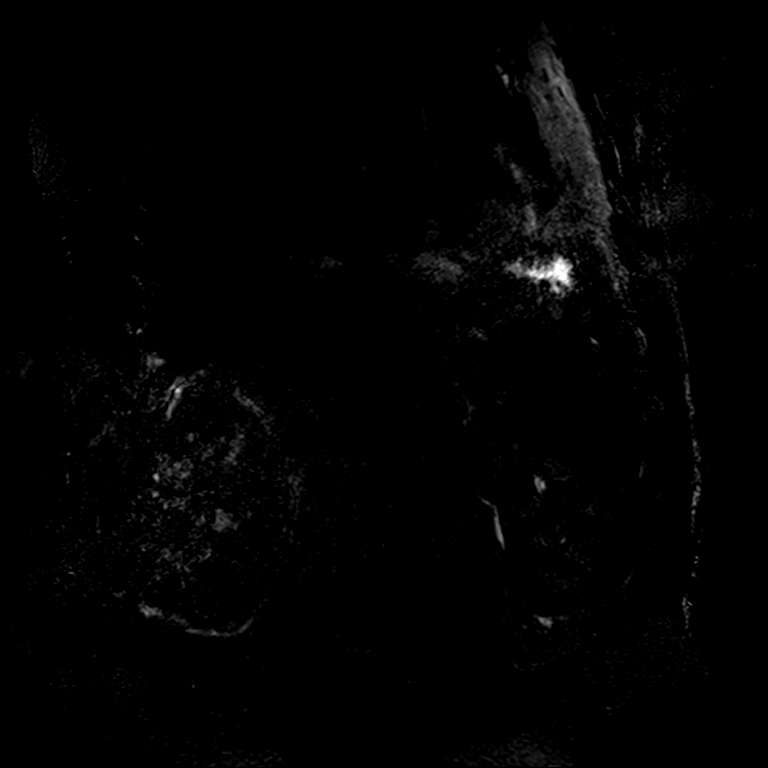
[im 44/88]
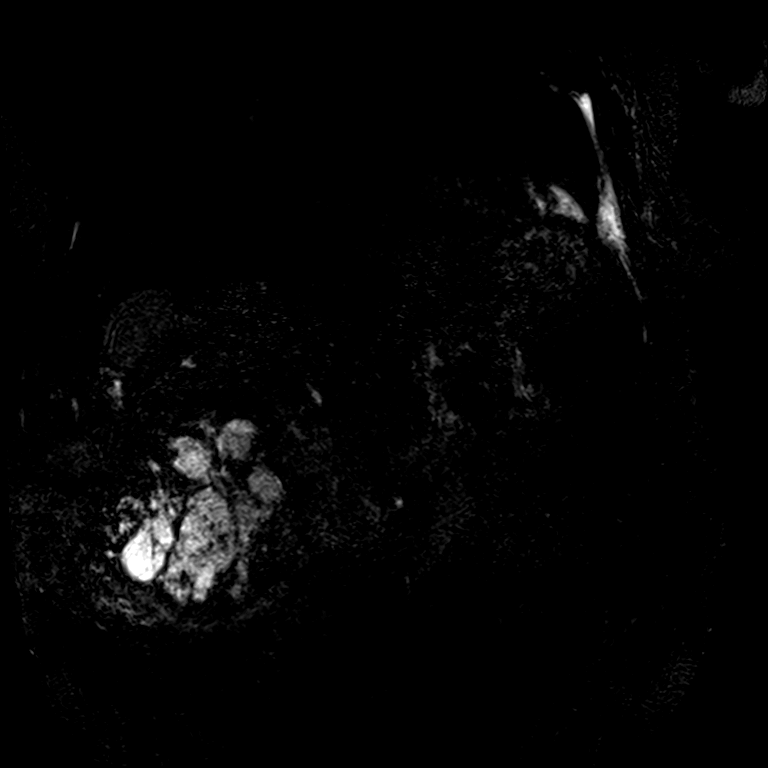
[im 88/88]
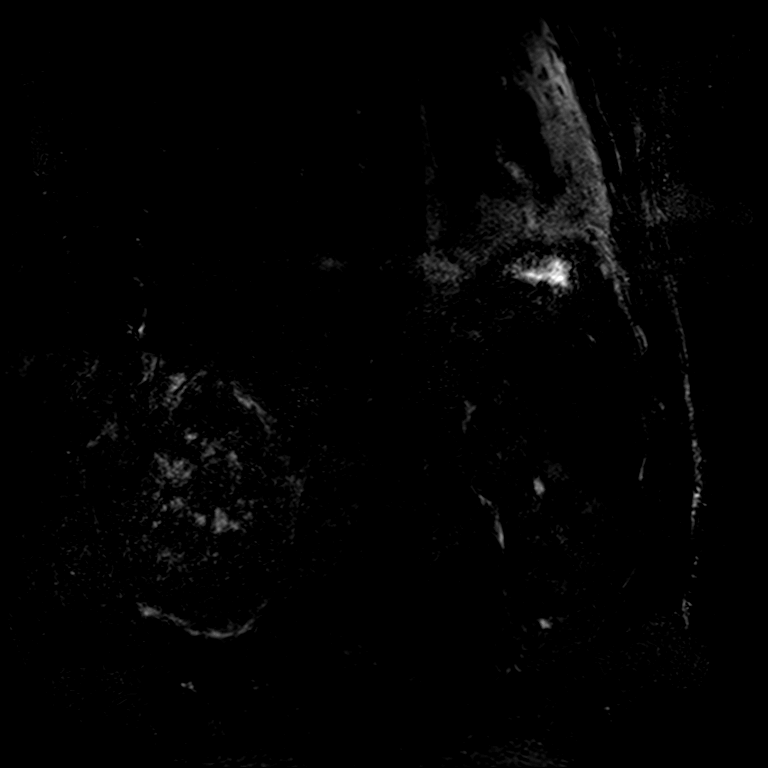

[Series 18: T2 · axial · 6.5mm · 1.56mm/px · 1 of 30 slices shown (2 of 2)]
[im 1/30]
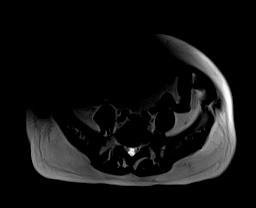

[Series 20: T1 dynamic · axial · 3.0mm · 1.25mm/px · z∈[-117,+168]mm · 3 of 96 slices shown (1 of 8)]
[im 1/96]
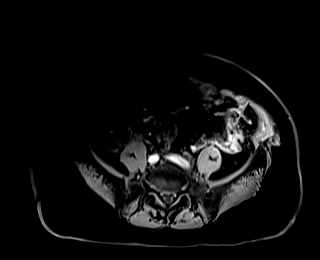
[im 48/96]
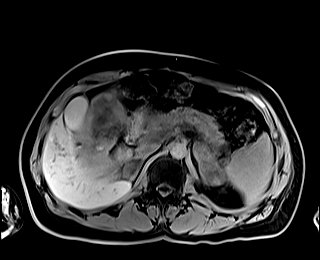
[im 96/96]
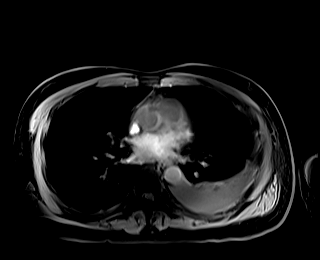

[Series 22: T1 dynamic · axial · 3.0mm · 1.25mm/px · z∈[-144,+69]mm · 2 of 72 slices shown (2 of 8)]
[im 1/72]
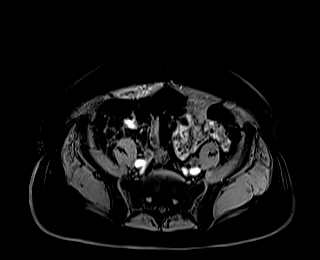
[im 72/72]
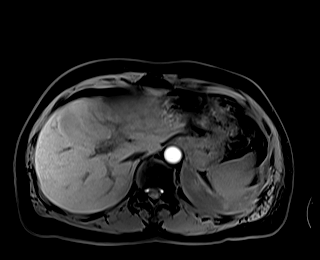

[Series 26: T1 dynamic · axial · 3.0mm · 1.25mm/px · z∈[-117,+168]mm · 3 of 96 slices shown (3 of 8)]
[im 1/96]
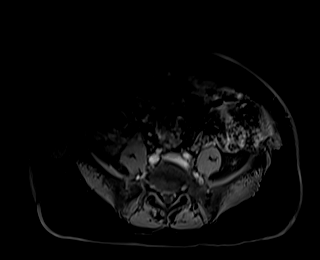
[im 48/96]
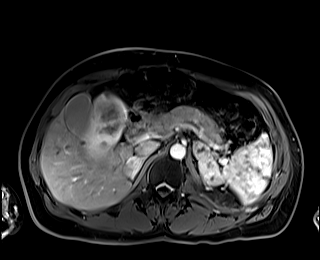
[im 96/96]
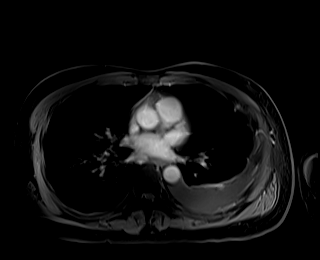

[Series 27: T1 dynamic · axial · 3.0mm · 1.25mm/px · z∈[-117,+168]mm · 3 of 96 slices shown (4 of 8)]
[im 1/96]
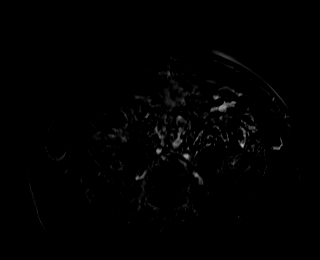
[im 48/96]
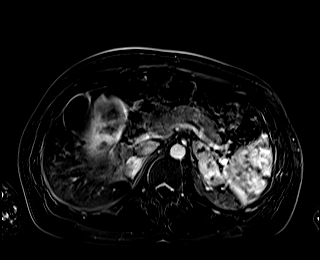
[im 96/96]
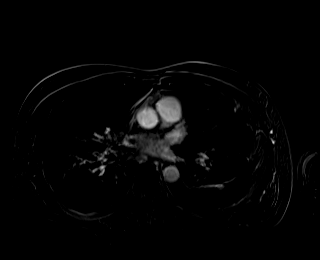

[Series 30: T1 dynamic · axial · 3.0mm · 1.25mm/px · z∈[-117,+168]mm · 3 of 96 slices shown (5 of 8)]
[im 1/96]
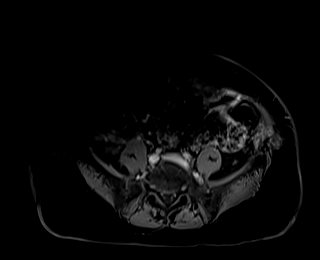
[im 48/96]
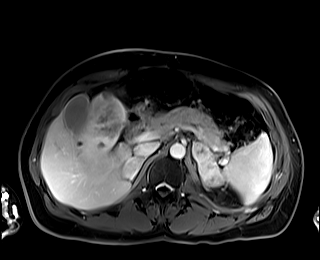
[im 96/96]
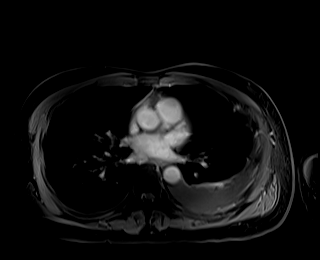

[Series 31: T1 dynamic · axial · 3.0mm · 1.25mm/px · z∈[-117,+168]mm · 3 of 96 slices shown (6 of 8)]
[im 1/96]
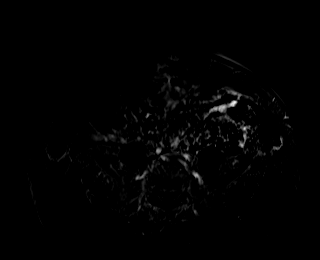
[im 48/96]
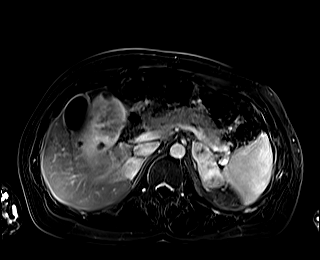
[im 96/96]
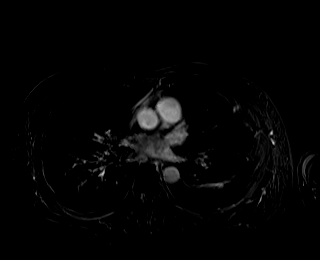

[Series 34: T1 dynamic · axial · 3.0mm · 1.25mm/px · z∈[-117,+168]mm · 3 of 96 slices shown (7 of 8)]
[im 1/96]
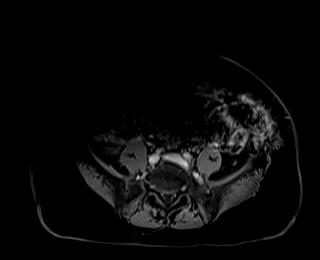
[im 48/96]
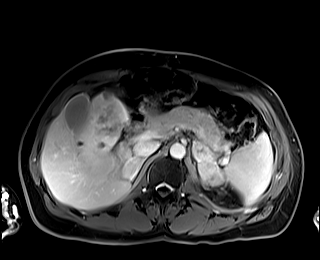
[im 96/96]
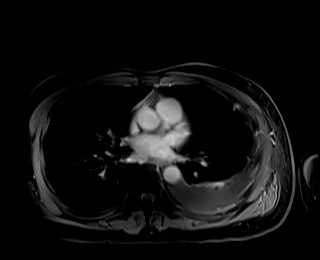

[Series 35: T1 dynamic · axial · 3.0mm · 1.25mm/px · 1 of 96 slices shown (8 of 8)]
[im 1/96]
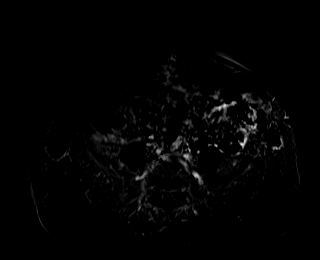

[35 of 48 positions shown; findings below may reference images not displayed]

FINDINGS: Lower chest: Trace right pleural effusion and moderate left pleural
effusion. Left-sided pleural metastases are again noted. Pulmonary
nodules are also identified consistent with metastatic disease.

Hepatobiliary: No suspicious enhancing liver lesion identified. No
gallstones. There is mild edema involving the wall of the
gallbladder which measures 4 mm in thickness, image [DATE]. No
significant bile duct dilatation.

Pancreas: There is mild diffuse pancreatic edema. The main
pancreatic duct is nondilated and difficult to visualize, which
limits assessment for pancreas divisum anatomy. No pancreatic mass
identified. No signs of pseudocyst or necrosis.

Spleen:  Within normal limits in size and appearance.

Adrenals/Urinary Tract: Signs of bilateral adrenal gland metastases
identified. The right adrenal lesion measures 2.9 x 1.9 cm, image
50/26. The left adrenal mass measures 6.1 x 2.4 cm, image 50/26.

Large necrotic mass is again noted arising off the right kidney.
This measures 13.3 x 10.5 cm, image 74/26 and is compatible with
known renal cell carcinoma. Solid enhancing lesion within the
posterior cortex of the interpolar left kidney measures 2.2 by
cm, image 20/18. Small bilateral renal cysts are also again noted.
No hydronephrosis identified bilaterally.

Stomach/Bowel: Visualized portions within the abdomen are
unremarkable.

Vascular/Lymphatic: Normal caliber abdominal aorta. The upper
abdominal vasculature remains patent. No signs of renal vein
thrombosis or thrombus within the IVC. No upper abdominal adenopathy
identified.

Other:  No significant free fluid or fluid collections.

Musculoskeletal: No suspicious bone lesions identified.
IMPRESSION: 1. Mild diffuse pancreatic edema compatible with pancreatitis. No
signs of pseudocyst or pancreatic necrosis. The main pancreatic duct
is difficult to visualize and therefore difficult to assess for
pancreas divisum anatomy.
2. Bilateral adrenal gland metastases.
3. Large necrotic mass arising off the right kidney is again noted
and is compatible with known renal cell carcinoma.
4. Solid enhancing lesion within the posterior cortex of the
interpolar left kidney measures 2.2 by 2.0 cm. Also compatible with
renal cell carcinoma. No signs of renal vein thrombosis or thrombus
within the IVC.
5. Left pleural effusion with signs of pleural metastasis. Trace
right pleural effusion.
6. Mild edema involving the gallbladder wall which measures 4 mm in
thickness, nonspecific in the setting of acute pancreatitis. No
gallstones or evidence for bile duct dilatation.

## 2021-07-04 MED ORDER — MORPHINE SULFATE (PF) 2 MG/ML IV SOLN: 1.0000 mg | INTRAVENOUS | Status: DC | PRN

## 2021-07-04 MED ORDER — GADOBUTROL 1 MMOL/ML IV SOLN
8.5000 mL | Freq: Once | INTRAVENOUS | Status: AC | PRN
  Administered 2021-07-04: 23:00:00 8.5 mL via INTRAVENOUS

## 2021-07-04 MED ORDER — OXYCODONE-ACETAMINOPHEN 5-325 MG PO TABS
1.0000 | ORAL_TABLET | ORAL | Status: DC | PRN
  Administered 2021-07-04 – 2021-07-06 (×7): 1 via ORAL
  Filled 2021-07-04 (×7): qty 1

## 2021-07-04 NOTE — Progress Notes (Signed)
PROGRESS NOTE    Dylan Good  AQT:[id number] DOB: 20-Oct-1970 DOA: 06/30/2021 PCP: Pcp, No    Chief Complaint  Patient presents with   Abdominal Pain    Brief Narrative:    Assessment & Plan:   Principal Problem:   Intractable pain Active Problems:   Kidney cancer, primary, with metastasis from kidney to other site [facility])   Brain metastases ([place])   Pancreatitis   Hypertension   Tobacco use   Acute pancreatitis   Acute recurrent pancreatitis Improving, currently on morphine, oxycodone, symptomatic management with IV fluids, IV antiemetics and IV Protonix. GI on board, underwent EGD which was essentially within normal limits. With significant epigastric pain and elevated lipase Normal liver enzymes .  MRI and MRCP to rule out pancreatic divisum.   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 as an outpatient.   Essential hypertension Blood pressure parameters appear to be optimal  Hypophosphatemia Replaced  Anemia of chronic disease Continue to monitor, no bleeding evident  Tobacco abuse smoking cessation counseling given   DVT prophylaxis: SCDs for Code Status: Full code Family Communication: None at bedside disposition:   Status is: Inpatient  Remains inpatient appropriate because: Pain controlled, acute pancreatitis, elevated lipase       Consultants:  Gastroenterology  Procedures: EGD Antimicrobials: None  Subjective: Abdominal pain improving, 1 episode of vomiting, still nauseated  Objective: Vitals:   07/03/21 1331 07/03/21 1415 07/03/21 2037 07/04/21 0453  BP: 134/81 (!) 154/99 (!) 143/81 132/80  Pulse: 86 79 80 81  Resp: 16 18 18 18   Temp:   98.5 F (36.9 C) 98.7 F (37.1 C)  TempSrc:   Oral Oral  SpO2: 98% 100% 98% 98%  Weight:      Height:        Intake/Output Summary (Last 24 hours) at 07/04/2021 1222 Last data filed at 07/04/2021 1000 Gross per 24 hour   Intake 2558.44 ml  Output 0 ml  Net 2558.44 ml   Filed Weights   06/30/21 2221 06/30/21 2238 07/03/21 1154  Weight: 78.9 kg 78.9 kg 78.9 kg    Examination:  General exam: Appears calm and comfortable  Respiratory system: Clear to auscultation. Respiratory effort normal. Cardiovascular system: S1 & S2 heard, RRR. No JVD,No pedal edema. Gastrointestinal system: Abdomen is soft, mild generalized tenderness, no signs of peritonitis, bowel sounds within normal limits, left flank pain prior Central nervous system: Alert and oriented. No focal neurological deficits. Extremities: Symmetric 5 x 5 power. Skin: No rashes, lesions or ulcers Psychiatry: Mood & affect appropriate.     Data Reviewed: I have personally reviewed following labs and imaging studies  CBC: Recent Labs  Lab 07/01/21 0213 07/02/21 0412 07/03/21 0422 07/04/21 0422  WBC 3.7* 3.4* 3.9* 3.8*  NEUTROABS 1.9 1.7 2.0 2.0  HGB 14.1 11.8* 11.5* 11.2*  HCT 45.3 37.3* 36.2* 35.6*  MCV 73.7* 73.6* 73.9* 73.6*  PLT 348 249 254 562    Basic Metabolic Panel: Recent Labs  Lab 07/01/21 0213 07/02/21 0412 07/03/21 0422  NA 134* 135 135  K 3.9 3.9 3.8  CL 101 105 106  CO2 23 23 22   GLUCOSE 100* 81 94  BUN 5* <5* <5*  CREATININE 0.64 0.51* 0.69  CALCIUM 8.9 8.4* 8.6*  MG 2.0  --  1.7  PHOS 2.7  --  2.4*    GFR: Estimated Creatinine Clearance: 112.9 mL/min (by C-G formula based on SCr of 0.69 mg/dL).  Liver  Function Tests: Recent Labs  Lab 07/01/21 0213 07/02/21 0412 07/03/21 0422  AST 13* 9* 11*  ALT 12 10 8   ALKPHOS 83 64 61  BILITOT 1.3* 1.4* 1.2  PROT 8.4* 6.6 7.1  ALBUMIN 3.8 3.1* 3.2*    CBG: No results for input(s): GLUCAP in the last 168 hours.   Recent Results (from the past 240 hour(s))  Resp Panel by RT-PCR (Flu A&B, Covid) Nasopharyngeal Swab     Status: None   Collection Time: 06/27/21  2:05 AM   Specimen: Nasopharyngeal Swab; Nasopharyngeal(NP) swabs in vial transport medium   Result Value Ref Range Status   SARS Coronavirus 2 by RT PCR NEGATIVE NEGATIVE Final    Comment: (NOTE) SARS-CoV-2 target nucleic acids are NOT DETECTED.  The SARS-CoV-2 RNA is generally detectable in upper respiratory specimens during the acute phase of infection. The lowest concentration of SARS-CoV-2 viral copies this assay can detect is 138 copies/mL. A negative result does not preclude SARS-Cov-2 infection and should not be used as the sole basis for treatment or other patient management decisions. A negative result may occur with  improper specimen collection/handling, submission of specimen other than nasopharyngeal swab, presence of viral mutation(s) within the areas targeted by this assay, and inadequate number of viral copies(<138 copies/mL). A negative result must be combined with clinical observations, patient history, and epidemiological information. The expected result is Negative.  Fact Sheet for Patients:  EntrepreneurPulse.com.au  Fact Sheet for Healthcare Providers:  IncredibleEmployment.be  This test is no t yet approved or cleared by the [place] FDA and  has been authorized for detection and/or diagnosis of SARS-CoV-2 by FDA under an Emergency Use Authorization (EUA). This EUA will remain  in effect (meaning this test can be used) for the duration of the COVID-19 declaration under Section 564(b)(1) of the Act, 21 U.S.C.section 360bbb-3(b)(1), unless the authorization is terminated  or revoked sooner.       Influenza A by PCR NEGATIVE NEGATIVE Final   Influenza B by PCR NEGATIVE NEGATIVE Final    Comment: (NOTE) The Xpert Xpress SARS-CoV-2/FLU/RSV plus assay is intended as an aid in the diagnosis of influenza from Nasopharyngeal swab specimens and should not be used as a sole basis for treatment. Nasal washings and aspirates are unacceptable for Xpert Xpress SARS-CoV-2/FLU/RSV testing.  Fact Sheet for  Patients: EntrepreneurPulse.com.au  Fact Sheet for Healthcare Providers: IncredibleEmployment.be  This test is not yet approved or cleared by the [place] FDA and has been authorized for detection and/or diagnosis of SARS-CoV-2 by FDA under an Emergency Use Authorization (EUA). This EUA will remain in effect (meaning this test can be used) for the duration of the COVID-19 declaration under Section 564(b)(1) of the Act, 21 U.S.C. section 360bbb-3(b)(1), unless the authorization is terminated or revoked.  Performed at [facility], [place] [address], [place], [place] [address]   Resp Panel by RT-PCR (Flu A&B, Covid) Nasopharyngeal Swab     Status: None   Collection Time: 07/01/21  6:15 AM   Specimen: Nasopharyngeal Swab; Nasopharyngeal(NP) swabs in vial transport medium  Result Value Ref Range Status   SARS Coronavirus 2 by RT PCR NEGATIVE NEGATIVE Final    Comment: (NOTE) SARS-CoV-2 target nucleic acids are NOT DETECTED.  The SARS-CoV-2 RNA is generally detectable in upper respiratory specimens during the acute phase of infection. The lowest concentration of SARS-CoV-2 viral copies this assay can detect is 138 copies/mL. A negative result does not preclude SARS-Cov-2 infection and should not be used  as the sole basis for treatment or other patient management decisions. A negative result may occur with  improper specimen collection/handling, submission of specimen other than nasopharyngeal swab, presence of viral mutation(s) within the areas targeted by this assay, and inadequate number of viral copies(<138 copies/mL). A negative result must be combined with clinical observations, patient history, and epidemiological information. The expected result is Negative.  Fact Sheet for Patients:  EntrepreneurPulse.com.au  Fact Sheet for Healthcare Providers:  IncredibleEmployment.be  This test  is no t yet approved or cleared by the [place] FDA and  has been authorized for detection and/or diagnosis of SARS-CoV-2 by FDA under an Emergency Use Authorization (EUA). This EUA will remain  in effect (meaning this test can be used) for the duration of the COVID-19 declaration under Section 564(b)(1) of the Act, 21 U.S.C.section 360bbb-3(b)(1), unless the authorization is terminated  or revoked sooner.       Influenza A by PCR NEGATIVE NEGATIVE Final   Influenza B by PCR NEGATIVE NEGATIVE Final    Comment: (NOTE) The Xpert Xpress SARS-CoV-2/FLU/RSV plus assay is intended as an aid in the diagnosis of influenza from Nasopharyngeal swab specimens and should not be used as a sole basis for treatment. Nasal washings and aspirates are unacceptable for Xpert Xpress SARS-CoV-2/FLU/RSV testing.  Fact Sheet for Patients: EntrepreneurPulse.com.au  Fact Sheet for Healthcare Providers: IncredibleEmployment.be  This test is not yet approved or cleared by the [place] FDA and has been authorized for detection and/or diagnosis of SARS-CoV-2 by FDA under an Emergency Use Authorization (EUA). This EUA will remain in effect (meaning this test can be used) for the duration of the COVID-19 declaration under Section 564(b)(1) of the Act, 21 U.S.C. section 360bbb-3(b)(1), unless the authorization is terminated or revoked.  Performed at [facility], [place] [address] [place], [place] [address]          Radiology Studies: No results found.      Scheduled Meds:  amLODipine  5 mg Oral Daily   pantoprazole (PROTONIX) IV  40 mg Intravenous Q24H   Continuous Infusions:  lactated ringers 100 mL/hr at 07/03/21 2016     LOS: 3 days       Hosie Poisson, MD [facility]   To contact the attending provider between 7A-7P or the covering provider during after hours 7P-7A, please log into the web site [email or web address]  and access using universal [place] password for that web site. If you do not have the password, please call the hospital operator.  07/04/2021, 12:22 PM

## 2021-07-04 NOTE — Progress Notes (Signed)
Subjective: Patient reports an episode of vomiting after breakfast today at 830. Continues to have pain located between epigastric and supraumbilical area, 7 out of 10 in intensity, drops to 3 out of 10 with pain medications. Reports 1 small bowel movement today. States that pain radiates to his left and right ribs and towards the back  Objective: Vital signs in last 24 hours: Temp:  [98.1 F (36.7 C)-98.7 F (37.1 C)] 98.7 F (37.1 C) (12/28 0453) Pulse Rate:  [77-86] 81 (12/28 0453) Resp:  [13-18] 18 (12/28 0453) BP: (84-154)/(45-99) 132/80 (12/28 0453) SpO2:  [98 %-100 %] 98 % (12/28 0453) Weight change:  Last BM Date: 07/04/21  PE: Mild pallor, no icterus GENERAL: Not in distress, able to speak in full sentences  ABDOMEN: Mild epigastric tenderness, otherwise no rigidity or guarding, normoactive bowel sounds EXTREMITIES: No deformity  Lab Results: Results for orders placed or performed during the hospital encounter of 06/30/21 (from the past 48 hour(s))  CBC WITH DIFFERENTIAL     Status: Abnormal   Collection Time: 07/03/21  4:22 AM  Result Value Ref Range   WBC 3.9 (L) 4.0 - 10.5 K/uL   RBC 4.90 4.22 - 5.81 MIL/uL   Hemoglobin 11.5 (L) 13.0 - 17.0 g/dL   HCT 36.2 (L) 39.0 - 52.0 %   MCV 73.9 (L) 80.0 - 100.0 fL   MCH 23.5 (L) 26.0 - 34.0 pg   MCHC 31.8 30.0 - 36.0 g/dL   RDW 22.5 (H) 11.5 - 15.5 %   Platelets 254 150 - 400 K/uL   nRBC 0.0 0.0 - 0.2 %   Neutrophils Relative % 51 %   Neutro Abs 2.0 1.7 - 7.7 K/uL   Lymphocytes Relative 32 %   Lymphs Abs 1.2 0.7 - 4.0 K/uL   Monocytes Relative 12 %   Monocytes Absolute 0.5 0.1 - 1.0 K/uL   Eosinophils Relative 4 %   Eosinophils Absolute 0.2 0.0 - 0.5 K/uL   Basophils Relative 1 %   Basophils Absolute 0.0 0.0 - 0.1 K/uL   Immature Granulocytes 0 %   Abs Immature Granulocytes 0.01 0.00 - 0.07 K/uL    Comment: Performed at [facility], [place] [address] [place], [place] [address]  Comprehensive  metabolic panel     Status: Abnormal   Collection Time: 07/03/21  4:22 AM  Result Value Ref Range   Sodium 135 135 - 145 mmol/L   Potassium 3.8 3.5 - 5.1 mmol/L   Chloride 106 98 - 111 mmol/L   CO2 22 22 - 32 mmol/L   Glucose, Bld 94 70 - 99 mg/dL    Comment: Glucose reference range applies only to samples taken after fasting for at least 8 hours.   BUN <5 (L) 6 - 20 mg/dL   Creatinine, Ser 0.69 0.61 - 1.24 mg/dL   Calcium 8.6 (L) 8.9 - 10.3 mg/dL   Total Protein 7.1 6.5 - 8.1 g/dL   Albumin 3.2 (L) 3.5 - 5.0 g/dL   AST 11 (L) 15 - 41 U/L   ALT 8 0 - 44 U/L   Alkaline Phosphatase 61 38 - 126 U/L   Total Bilirubin 1.2 0.3 - 1.2 mg/dL   GFR, Estimated >60 >60 mL/min    Comment: (NOTE) Calculated using the CKD-EPI Creatinine Equation (2021)    Anion gap 7 5 - 15    Comment: Performed at [facility], [place] [address], [place], [place] [address]  Magnesium     Status:  None   Collection Time: 07/03/21  4:22 AM  Result Value Ref Range   Magnesium 1.7 1.7 - 2.4 mg/dL    Comment: Performed at [facility], [place] [address] [place], [place] [address]  Phosphorus     Status: Abnormal   Collection Time: 07/03/21  4:22 AM  Result Value Ref Range   Phosphorus 2.4 (L) 2.5 - 4.6 mg/dL    Comment: Performed at [facility], [place] [address] [place], [place] [address]  Lipase, blood     Status: Abnormal   Collection Time: 07/03/21  4:22 AM  Result Value Ref Range   Lipase 309 (H) 11 - 51 U/L    Comment: Performed at [facility], [place] [address] [place], [place] [address]  CBC WITH DIFFERENTIAL     Status: Abnormal   Collection Time: 07/04/21  4:22 AM  Result Value Ref Range   WBC 3.8 (L) 4.0 - 10.5 K/uL   RBC 4.84 4.22 - 5.81 MIL/uL   Hemoglobin 11.2 (L) 13.0 - 17.0 g/dL   HCT 35.6 (L) 39.0 - 52.0 %   MCV 73.6 (L) 80.0 - 100.0 fL   MCH 23.1 (L) 26.0 - 34.0 pg   MCHC 31.5 30.0 - 36.0 g/dL   RDW 22.7 (H) 11.5 -  15.5 %   Platelets 263 150 - 400 K/uL   nRBC 0.0 0.0 - 0.2 %   Neutrophils Relative % 54 %   Neutro Abs 2.0 1.7 - 7.7 K/uL   Lymphocytes Relative 27 %   Lymphs Abs 1.0 0.7 - 4.0 K/uL   Monocytes Relative 13 %   Monocytes Absolute 0.5 0.1 - 1.0 K/uL   Eosinophils Relative 5 %   Eosinophils Absolute 0.2 0.0 - 0.5 K/uL   Basophils Relative 1 %   Basophils Absolute 0.0 0.0 - 0.1 K/uL   Immature Granulocytes 0 %   Abs Immature Granulocytes 0.01 0.00 - 0.07 K/uL    Comment: Performed at [facility], [place] [address], [place], [place] [address]    Studies/Results: No results found.  Medications: I have reviewed the patient's current medications.  Assessment: Renal cancer with bilateral adrenal metastases and several pulmonary nodules, numerous pleural-based lesions with a large right necrotic renal mass  Although pancreas appears normal patient continues to have persistent epigastric pain and elevated lipase concerning for pancreatitis Normal LFTs, normal triglycerides, normal IgG subclass 4  Plan: MRI/MRCP to rule out etiology of elevated lipase, will need to consider pancreatic divisum Patient has renal cancer with metastases, which is likely cause of abdominal pain with radiation to several areas. If MRI/MRCP shows unremarkable pancreas, elevated lipase could be nonspecific   Ronnette Juniper, MD 07/04/2021, 1:13 PM

## 2021-07-05 DIAGNOSIS — R52 Pain, unspecified: Secondary | ICD-10-CM | POA: Diagnosis not present

## 2021-07-05 DIAGNOSIS — I10 Essential (primary) hypertension: Secondary | ICD-10-CM | POA: Diagnosis not present

## 2021-07-05 DIAGNOSIS — K859 Acute pancreatitis without necrosis or infection, unspecified: Secondary | ICD-10-CM | POA: Diagnosis not present

## 2021-07-05 LAB — BASIC METABOLIC PANEL
Anion gap: 5 (ref 5–15)
BUN: <5 mg/dL — ABNORMAL LOW (ref 6–20)
CO2: 26 mmol/L (ref 22–32)
Calcium: 9.2 mg/dL (ref 8.9–10.3)
Chloride: 105 mmol/L (ref 98–111)
Creatinine, Ser: 0.57 mg/dL — ABNORMAL LOW (ref 0.61–1.24)
GFR, Estimated: >60 mL/min (ref >60)
Glucose, Bld: 131 mg/dL — ABNORMAL HIGH (ref 70–99)
Potassium: 3.7 mmol/L (ref 3.5–5.1)
Sodium: 136 mmol/L (ref 135–145)

## 2021-07-05 LAB — CBC WITH DIFFERENTIAL/PLATELET
Abs Immature Granulocytes: 0.01 10*3/uL (ref 0.00–0.07)
Basophils Absolute: 0 10*3/uL (ref 0.0–0.1)
Basophils Relative: 1 %
Eosinophils Absolute: 0.3 10*3/uL (ref 0.0–0.5)
Eosinophils Relative: 6 %
HCT: 36.4 % — ABNORMAL LOW (ref 39.0–52.0)
Hemoglobin: 11.4 g/dL — ABNORMAL LOW (ref 13.0–17.0)
Immature Granulocytes: 0 %
Lymphocytes Relative: 33 %
Lymphs Abs: 1.3 10*3/uL (ref 0.7–4.0)
MCH: 23.4 pg — ABNORMAL LOW (ref 26.0–34.0)
MCHC: 31.3 g/dL (ref 30.0–36.0)
MCV: 74.6 fL — ABNORMAL LOW (ref 80.0–100.0)
Monocytes Absolute: 0.6 10*3/uL (ref 0.1–1.0)
Monocytes Relative: 14 %
Neutro Abs: 1.8 10*3/uL (ref 1.7–7.7)
Neutrophils Relative %: 46 %
Platelets: 255 10*3/uL (ref 150–400)
RBC: 4.88 MIL/uL (ref 4.22–5.81)
RDW: 22.9 % — ABNORMAL HIGH (ref 11.5–15.5)
WBC: 4 10*3/uL (ref 4.0–10.5)
nRBC: 0 % (ref 0.0–0.2)

## 2021-07-05 LAB — LIPASE, BLOOD: Lipase: 339 U/L — ABNORMAL HIGH (ref 11–51)

## 2021-07-05 LAB — SURGICAL PATHOLOGY

## 2021-07-05 MED ORDER — POLYETHYLENE GLYCOL 3350 17 G PO PACK
17.0000 g | PACK | Freq: Two times a day (BID) | ORAL | Status: DC
  Administered 2021-07-05 – 2021-07-06 (×3): 17 g via ORAL
  Filled 2021-07-05 (×3): qty 1

## 2021-07-05 NOTE — Progress Notes (Signed)
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  He was admitted a few weeks ago and was suspected to have pancreatitis in the setting of his chemotherapy. He was discharged home on oral pain medications. He presents with similar complaints of nausea, vomiting and abdominal pain.  He underwent EGD which was unremarkable.  He also underwent MRI showing diffuse pancreatitis.    Assessment & Plan:   Principal Problem:   Intractable pain Active Problems:   Kidney cancer, primary, with metastasis from kidney to other site [facility])   Brain metastases (HCC)   Pancreatitis   Hypertension   Tobacco use   Acute pancreatitis   Acute recurrent pancreatitis Improving, currently on morphine, oxycodone, symptomatic management with IV fluids, IV antiemetics and IV Protonix. GI on board, underwent EGD which was essentially within normal limits. With significant epigastric pain and elevated lipase Normal liver enzymes  MRI and MRCP ordered to rule out pancreatic divisum.  It shows Mild diffuse pancreatic edema compatible with pancreatitis. No signs of pseudocyst or pancreatic necrosis. Downgrade diet to full liquid and monitor.    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 as an outpatient.   Essential hypertension Blood pressure parameters appear well controlled.   Hypophosphatemia Replaced  Anemia of chronic disease Continue to monitor, no bleeding evident  Tobacco abuse  smoking cessation counseling given   DVT prophylaxis: SCDs for Code Status: Full code Family Communication: None at bedside  disposition:   Status is: Inpatient  Remains inpatient appropriate because: Pain control,  acute pancreatitis, elevated lipase       Consultants:  Gastroenterology  Procedures: EGD MRCP  Mild diffuse pancreatic edema compatible with pancreatitis. No signs of pseudocyst or pancreatic necrosis. The main pancreatic duct is difficult to visualize and therefore difficult to assess for pancreas divisum anatomy. 2. Bilateral adrenal gland metastases. 3. Large necrotic mass arising off the right kidney is again noted and is compatible with known renal cell carcinoma. 4. Solid enhancing lesion within the posterior cortex of the interpolar left kidney measures 2.2 by 2.0 cm. Also compatible with renal cell carcinoma. No signs of renal vein thrombosis or thrombus within the IVC. 5. 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 Trace right pleural effusion. 6. Mild edema involving the gallbladder wall which measures 4 mm in thickness, nonspecific in the setting of acute pancreatitis. No gallstones or evidence for bile duct dilatation.   Antimicrobials: None  Subjective: Abd pain is the same, no vomiting, nauseated.   Objective: Vitals:   07/04/21 0453 07/04/21 1350 07/04/21 2128 07/05/21 0542  BP: 132/80 (!) 152/90 137/83 137/90  Pulse: 81 88 93 77  Resp: 18 16 16 18   Temp: 98.7 F (37.1 C) 98.1 F (36.7 C) 98.4 F (36.9 C) 98.3 F (36.8 C)  TempSrc: Oral Oral Oral Oral  SpO2: 98% 97% 98% 99%  Weight:      Height:        Intake/Output Summary (Last 24 hours) at 07/05/2021 1315 Last data filed  at 07/05/2021 0600 Gross per 24 hour  Intake 2300.1 ml  Output --  Net 2300.1 ml    Filed Weights   06/30/21 2221 06/30/21 2238 07/03/21 1154  Weight: 78.9 kg 78.9 kg 78.9 kg    Examination:  General exam: ill appearing gentleman, not  in distress.  Respiratory system: Clear to auscultation. Respiratory effort normal. Cardiovascular system: S1 & S2 heard, RRR. No JVD, . No pedal edema. Gastrointestinal system: Abdomen is soft, generalized tenderness, not distended. Normal bowel sounds heard. Central nervous system: Alert and oriented. No focal neurological deficits. Extremities: Symmetric 5 x 5 power. Skin: No rashes, lesions or ulcers Psychiatry:  Mood & affect appropriate.      Data Reviewed: I have personally reviewed following labs and imaging studies  CBC: Recent Labs  Lab 07/01/21 0213 07/02/21 0412 07/03/21 0422 07/04/21 0422 07/05/21 0421  WBC 3.7* 3.4* 3.9* 3.8* 4.0  NEUTROABS 1.9 1.7 2.0 2.0 1.8  HGB 14.1 11.8* 11.5* 11.2* 11.4*  HCT 45.3 37.3* 36.2* 35.6* 36.4*  MCV 73.7* 73.6* 73.9* 73.6* 74.6*  PLT 348 249 254 263 255     Basic Metabolic Panel: Recent Labs  Lab 07/01/21 0213 07/02/21 0412 07/03/21 0422 07/05/21 1033  NA 134* 135 135 136  K 3.9 3.9 3.8 3.7  CL 101 105 106 105  CO2 23 23 22 26   GLUCOSE 100* 81 94 131*  BUN 5* <5* <5* <5*  CREATININE 0.64 0.51* 0.69 0.57*  CALCIUM 8.9 8.4* 8.6* 9.2  MG 2.0  --  1.7  --   PHOS 2.7  --  2.4*  --      GFR: Estimated Creatinine Clearance: 112.9 mL/min (A) (by C-G formula based on SCr of 0.57 mg/dL (L)).  Liver Function Tests: Recent Labs  Lab 07/01/21 0213 07/02/21 0412 07/03/21 0422  AST 13* 9* 11*  ALT 12 10 8   ALKPHOS 83 64 61  BILITOT 1.3* 1.4* 1.2  PROT 8.4* 6.6 7.1  ALBUMIN 3.8 3.1* 3.2*     CBG: No results for input(s): GLUCAP in the last 168 hours.   Recent Results (from the past 240 hour(s))  Resp Panel by RT-PCR (Flu A&B, Covid) Nasopharyngeal Swab     Status: None   Collection Time: 06/27/21  2:05 AM   Specimen: Nasopharyngeal Swab; Nasopharyngeal(NP) swabs in vial transport medium  Result Value Ref Range Status   SARS Coronavirus 2 by RT PCR NEGATIVE NEGATIVE Final    Comment: (NOTE) SARS-CoV-2 target  nucleic acids are NOT DETECTED.  The SARS-CoV-2 RNA is generally detectable in upper respiratory specimens during the acute phase of infection. The lowest concentration of SARS-CoV-2 viral copies this assay can detect is 138 copies/mL. A negative result does not preclude SARS-Cov-2 infection and should not be used as the sole basis for treatment or other patient management decisions. A negative result may occur with  improper specimen collection/handling, submission of specimen other than nasopharyngeal swab, presence of viral mutation(s) within the areas targeted by this assay, and inadequate number of viral copies(<138 copies/mL). A negative result must be combined with clinical observations, patient history, and epidemiological information. The expected result is Negative.  Fact Sheet for Patients:  EntrepreneurPulse.com.au  Fact Sheet for Healthcare Providers:  IncredibleEmployment.be  This test is no t yet approved or cleared by the [place] FDA and  has been authorized for detection and/or diagnosis of SARS-CoV-2 by FDA under an Emergency Use Authorization (EUA). This EUA will remain  in effect (meaning this test can  be used) for the duration of the COVID-19 declaration under Section 564(b)(1) of the Act, 21 U.S.C.section 360bbb-3(b)(1), unless the authorization is terminated  or revoked sooner.       Influenza A by PCR NEGATIVE NEGATIVE Final   Influenza B by PCR NEGATIVE NEGATIVE Final    Comment: (NOTE) The Xpert Xpress SARS-CoV-2/FLU/RSV plus assay is intended as an aid in the diagnosis of influenza from Nasopharyngeal swab specimens and should not be used as a sole basis for treatment. Nasal washings and aspirates are unacceptable for Xpert Xpress SARS-CoV-2/FLU/RSV testing.  Fact Sheet for Patients: EntrepreneurPulse.com.au  Fact Sheet for Healthcare  Providers: IncredibleEmployment.be  This test is not yet approved or cleared by the [place] FDA and has been authorized for detection and/or diagnosis of SARS-CoV-2 by FDA under an Emergency Use Authorization (EUA). This EUA will remain in effect (meaning this test can be used) for the duration of the COVID-19 declaration under Section 564(b)(1) of the Act, 21 U.S.C. section 360bbb-3(b)(1), unless the authorization is terminated or revoked.  Performed at [facility], [place] [address] [place], [place] [address]   Resp Panel by RT-PCR (Flu A&B, Covid) Nasopharyngeal Swab     Status: None   Collection Time: 07/01/21  6:15 AM   Specimen: Nasopharyngeal Swab; Nasopharyngeal(NP) swabs in vial transport medium  Result Value Ref Range Status   SARS Coronavirus 2 by RT PCR NEGATIVE NEGATIVE Final    Comment: (NOTE) SARS-CoV-2 target nucleic acids are NOT DETECTED.  The SARS-CoV-2 RNA is generally detectable in upper respiratory specimens during the acute phase of infection. The lowest concentration of SARS-CoV-2 viral copies this assay can detect is 138 copies/mL. A negative result does not preclude SARS-Cov-2 infection and should not be used as the sole basis for treatment or other patient management decisions. A negative result may occur with  improper specimen collection/handling, submission of specimen other than nasopharyngeal swab, presence of viral mutation(s) within the areas targeted by this assay, and inadequate number of viral copies(<138 copies/mL). A negative result must be combined with clinical observations, patient history, and epidemiological information. The expected result is Negative.  Fact Sheet for Patients:  EntrepreneurPulse.com.au  Fact Sheet for Healthcare Providers:  IncredibleEmployment.be  This test is no t yet approved or cleared by the [place] FDA and  has been  authorized for detection and/or diagnosis of SARS-CoV-2 by FDA under an Emergency Use Authorization (EUA). This EUA will remain  in effect (meaning this test can be used) for the duration of the COVID-19 declaration under Section 564(b)(1) of the Act, 21 U.S.C.section 360bbb-3(b)(1), unless the authorization is terminated  or revoked sooner.       Influenza A by PCR NEGATIVE NEGATIVE Final   Influenza B by PCR NEGATIVE NEGATIVE Final    Comment: (NOTE) The Xpert Xpress SARS-CoV-2/FLU/RSV plus assay is intended as an aid in the diagnosis of influenza from Nasopharyngeal swab specimens and should not be used as a sole basis for treatment. Nasal washings and aspirates are unacceptable for Xpert Xpress SARS-CoV-2/FLU/RSV testing.  Fact Sheet for Patients: EntrepreneurPulse.com.au  Fact Sheet for Healthcare Providers: IncredibleEmployment.be  This test is not yet approved or cleared by the [place] FDA and has been authorized for detection and/or diagnosis of SARS-CoV-2 by FDA under an Emergency Use Authorization (EUA). This EUA will remain in effect (meaning this test can be used) for the duration of the COVID-19 declaration under Section 564(b)(1) of the Act, 21 U.S.C. section 360bbb-3(b)(1), unless the authorization  is terminated or revoked.  Performed at [facility], [place] [address] [place], [place] [address]           Radiology Studies: MR 3D Recon At Scanner  Result Date: 07/05/2021 CLINICAL DATA:  Elevated lipase. Evaluate for pancreas divisum anatomy and pancreatic inflammation. History of metastatic renal cell carcinoma EXAM: MRI ABDOMEN WITHOUT AND WITH CONTRAST (INCLUDING MRCP) TECHNIQUE: Multiplanar multisequence MR imaging of the abdomen was performed both before and after the administration of intravenous contrast. Heavily T2-weighted images of the biliary and pancreatic ducts were obtained, and  three-dimensional MRCP images were rendered by post processing. CONTRAST:  8.42mL GADAVIST GADOBUTROL 1 MMOL/ML IV SOLN COMPARISON:  CT AP 07/01/2021 FINDINGS: Lower chest: Trace right pleural effusion and moderate left pleural effusion. 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 Pulmonary nodules are also identified consistent with metastatic disease. Hepatobiliary: No suspicious enhancing liver lesion identified. No gallstones. There is mild edema involving the wall of the gallbladder which measures 4 mm in thickness, image 16/4. No significant bile duct dilatation. Pancreas: There is mild diffuse pancreatic edema. The main pancreatic duct is nondilated and difficult to visualize, which limits assessment for pancreas divisum anatomy. No pancreatic mass identified. No signs of pseudocyst or necrosis. Spleen:  Within normal limits in size and appearance. Adrenals/Urinary Tract: Signs of bilateral adrenal gland metastases identified. The right adrenal lesion measures 2.9 x 1.9 cm, image 50/26. The left adrenal mass measures 6.1 x 2.4 cm, image 50/26. Large necrotic mass is again noted arising off the right kidney. This measures 13.3 x 10.5 cm, image 74/26 and is compatible with known renal cell carcinoma. Solid enhancing lesion within the posterior cortex of the interpolar left kidney measures 2.2 by 2.0 cm, image 20/18. Small bilateral renal cysts are also again noted. No hydronephrosis identified bilaterally. Stomach/Bowel: Visualized portions within the abdomen are unremarkable. Vascular/Lymphatic: Normal caliber abdominal aorta. The upper abdominal vasculature remains patent. No signs of renal vein thrombosis or thrombus within the IVC. No upper abdominal adenopathy identified. Other:  No significant free fluid or fluid collections. Musculoskeletal: No suspicious bone lesions identified. IMPRESSION: 1. Mild diffuse pancreatic edema compatible with pancreatitis. No signs of pseudocyst or pancreatic necrosis.  The main pancreatic duct is difficult to visualize and therefore difficult to assess for pancreas divisum anatomy. 2. Bilateral adrenal gland metastases. 3. Large necrotic mass arising off the right kidney is again noted and is compatible with known renal cell carcinoma. 4. Solid enhancing lesion within the posterior cortex of the interpolar left kidney measures 2.2 by 2.0 cm. Also compatible with renal cell carcinoma. No signs of renal vein thrombosis or thrombus within the IVC. 5. 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 Trace right pleural effusion. 6. Mild edema involving the gallbladder wall which measures 4 mm in thickness, nonspecific in the setting of acute pancreatitis. No gallstones or evidence for bile duct dilatation. Electronically Signed   By: Kerby Moors M.D.   On: 07/05/2021 07:32   MR ABDOMEN MRCP W WO CONTAST  Result Date: 07/05/2021 CLINICAL DATA:  Elevated lipase. Evaluate for pancreas divisum anatomy and pancreatic inflammation. History of metastatic renal cell carcinoma EXAM: MRI ABDOMEN WITHOUT AND WITH CONTRAST (INCLUDING MRCP) TECHNIQUE: Multiplanar multisequence MR imaging of the abdomen was performed both before and after the administration of intravenous contrast. Heavily T2-weighted images of the biliary and pancreatic ducts were obtained, and three-dimensional MRCP images were rendered by post processing. CONTRAST:  8.18mL GADAVIST GADOBUTROL 1 MMOL/ML IV SOLN COMPARISON:  CT AP 07/01/2021 FINDINGS: Lower chest: Trace right pleural effusion and moderate left pleural effusion. 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 Pulmonary nodules are also identified consistent with metastatic disease. Hepatobiliary: No suspicious enhancing liver lesion identified. No gallstones. There is mild edema involving the wall of the gallbladder which measures 4 mm in thickness, image 16/4. No significant bile duct dilatation. Pancreas: There is mild diffuse pancreatic edema. The  main pancreatic duct is nondilated and difficult to visualize, which limits assessment for pancreas divisum anatomy. No pancreatic mass identified. No signs of pseudocyst or necrosis. Spleen:  Within normal limits in size and appearance. Adrenals/Urinary Tract: Signs of bilateral adrenal gland metastases identified. The right adrenal lesion measures 2.9 x 1.9 cm, image 50/26. The left adrenal mass measures 6.1 x 2.4 cm, image 50/26. Large necrotic mass is again noted arising off the right kidney. This measures 13.3 x 10.5 cm, image 74/26 and is compatible with known renal cell carcinoma. Solid enhancing lesion within the posterior cortex of the interpolar left kidney measures 2.2 by 2.0 cm, image 20/18. Small bilateral renal cysts are also again noted. No hydronephrosis identified bilaterally. Stomach/Bowel: Visualized portions within the abdomen are unremarkable. Vascular/Lymphatic: Normal caliber abdominal aorta. The upper abdominal vasculature remains patent. No signs of renal vein thrombosis or thrombus within the IVC. No upper abdominal adenopathy identified. Other:  No significant free fluid or fluid collections. Musculoskeletal: No suspicious bone lesions identified. IMPRESSION: 1. Mild diffuse pancreatic edema compatible with pancreatitis. No signs of pseudocyst or pancreatic necrosis. The main pancreatic duct is difficult to visualize and therefore difficult to assess for pancreas divisum anatomy. 2. Bilateral adrenal gland metastases. 3. Large necrotic mass arising off the right kidney is again noted and is compatible with known renal cell carcinoma. 4. Solid enhancing lesion within the posterior cortex of the interpolar left kidney measures 2.2 by 2.0 cm. Also compatible with renal cell carcinoma. No signs of renal vein thrombosis or thrombus within the IVC. 5. 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 Trace right pleural effusion. 6. Mild edema involving the gallbladder wall which measures  4 mm in thickness, nonspecific in the setting of acute pancreatitis. No gallstones or evidence for bile duct dilatation. Electronically Signed   By: Kerby Moors M.D.   On: 07/05/2021 07:32        Scheduled Meds:  amLODipine  5 mg Oral Daily   pantoprazole (PROTONIX) IV  40 mg Intravenous Q24H   polyethylene glycol  17 g Oral BID   Continuous Infusions:  lactated ringers 100 mL/hr at 07/05/21 0347     LOS: 4 days       Hosie Poisson, MD [facility]   To contact the attending provider between 7A-7P or the covering provider during after hours 7P-7A, please log into the web site [email or web address] and access using universal [place] password for that web site. If you do not have the password, please call the hospital operator.  07/05/2021, 1:15 PM

## 2021-07-05 NOTE — Progress Notes (Signed)
Subjective: Patient reports improvement in abdominal pain. With use of narcotics, he has only had 2 bowel movement since admission. After 1 episode of vomiting his diet was converted to full liquid, he has not had any further episodes of nausea or vomiting, and is requesting for his diet to be advanced.  Objective: Vital signs in last 24 hours: Temp:  [98.1 F (36.7 C)-98.4 F (36.9 C)] 98.3 F (36.8 C) (12/29 0542) Pulse Rate:  [77-93] 77 (12/29 0542) Resp:  [16-18] 18 (12/29 0542) BP: (137-152)/(83-90) 137/90 (12/29 0542) SpO2:  [97 %-99 %] 99 % (12/29 0542) Weight change:  Last BM Date: 07/04/21  PE: Mild pallor, no icterus GENERAL: Not in distress, on room air, able to speak in full sentences  ABDOMEN: Mild upper abdominal tenderness however no rigidity or guarding noted, normoactive bowel sounds EXTREMITIES: No deformity  Lab Results: Results for orders placed or performed during the hospital encounter of 06/30/21 (from the past 48 hour(s))  Surgical pathology     Status: None   Collection Time: 07/03/21  1:06 PM  Result Value Ref Range   SURGICAL PATHOLOGY      SURGICAL PATHOLOGY CASE: [id number] PATIENT: Dylan Good Surgical Pathology Report     Clinical History: Abdominal pain (crm)     FINAL MICROSCOPIC DIAGNOSIS:  A. STOMACH, BIOPSY: - Antral and oxyntic mucosa with minimal chronic inflammation. - Negative for active inflammation and H pylori. - Negative for intestinal metaplasia, dysplasia, and malignancy.   GROSS DESCRIPTION:  Received in formalin are tan, soft tissue fragments that are submitted in toto. Number: 3 size: Range from 0.2 to 0.4 cm blocks: 1 Craig Staggers 07/03/2021)    Final Diagnosis performed by Betsy Pries, MD.   Electronically signed 07/05/2021 Technical component performed at [facility], [place] [address] [place], [place] [address].  Professional component performed at [facility]. [address] [place], [place] [address]  Immunohistochemistry Technical component (if applicable) was performed at [facility]. [address], [place], [place], [place] [address].  IMMUNOHISTOCHEMISTRY DISCLAIMER (if applicable): Some of these immunohistochemical stains may have been developed and the performance characteristics determine by [facility]. Some may not have been cleared or approved by the U.S. Food and Drug Administration. The FDA has determined that such clearance or approval is not necessary. This test is used for clinical purposes. It should not be regarded as investigational or for research. This laboratory is certified under the [place] (CLIA-88) as qualified to perform high complexity clinical laboratory testing.  The controls stained appropriately.   CBC WITH DIFFERENTIAL     Status: Abnormal   Collection Time: 07/04/21  4:22 AM  Result Value Ref Range   WBC 3.8 (L) 4.0 - 10.5 K/uL   RBC 4.84 4.22 - 5.81 MIL/uL   Hemoglobin 11.2 (L) 13.0 - 17.0 g/dL   HCT 35.6 (L) 39.0 - 52.0 %   MCV 73.6 (L) 80.0 - 100.0 fL   MCH 23.1 (L) 26.0 - 34.0 pg   MCHC 31.5 30.0 - 36.0 g/dL   RDW 22.7 (H) 11.5 - 15.5 %   Platelets 263 150 - 400 K/uL   nRBC 0.0 0.0 - 0.2 %   Neutrophils Relative % 54 %   Neutro Abs 2.0 1.7 - 7.7 K/uL   Lymphocytes Relative 27 %   Lymphs Abs 1.0 0.7 - 4.0 K/uL   Monocytes Relative 13 %   Monocytes Absolute 0.5 0.1 -  1.0 K/uL   Eosinophils Relative 5 %   Eosinophils Absolute 0.2 0.0 - 0.5 K/uL   Basophils Relative 1 %   Basophils Absolute 0.0 0.0 - 0.1 K/uL   Immature Granulocytes 0 %   Abs Immature Granulocytes 0.01 0.00 - 0.07 K/uL    Comment: Performed at [facility], [place] [address] [place], [place] [address]  Lipase, blood     Status: Abnormal   Collection Time: 07/04/21 12:43 PM  Result Value Ref Range   Lipase 369 (H) 11 - 51 U/L     Comment: Performed at [facility], [place] [address], [place], [place] [address]  CBC WITH DIFFERENTIAL     Status: Abnormal   Collection Time: 07/05/21  4:21 AM  Result Value Ref Range   WBC 4.0 4.0 - 10.5 K/uL   RBC 4.88 4.22 - 5.81 MIL/uL   Hemoglobin 11.4 (L) 13.0 - 17.0 g/dL   HCT 36.4 (L) 39.0 - 52.0 %   MCV 74.6 (L) 80.0 - 100.0 fL   MCH 23.4 (L) 26.0 - 34.0 pg   MCHC 31.3 30.0 - 36.0 g/dL   RDW 22.9 (H) 11.5 - 15.5 %   Platelets 255 150 - 400 K/uL   nRBC 0.0 0.0 - 0.2 %   Neutrophils Relative % 46 %   Neutro Abs 1.8 1.7 - 7.7 K/uL   Lymphocytes Relative 33 %   Lymphs Abs 1.3 0.7 - 4.0 K/uL   Monocytes Relative 14 %   Monocytes Absolute 0.6 0.1 - 1.0 K/uL   Eosinophils Relative 6 %   Eosinophils Absolute 0.3 0.0 - 0.5 K/uL   Basophils Relative 1 %   Basophils Absolute 0.0 0.0 - 0.1 K/uL   Immature Granulocytes 0 %   Abs Immature Granulocytes 0.01 0.00 - 0.07 K/uL    Comment: Performed at [facility], [place] [address], [place], [place] [address]  Lipase, blood     Status: Abnormal   Collection Time: 07/05/21 10:33 AM  Result Value Ref Range   Lipase 339 (H) 11 - 51 U/L    Comment: Performed at [facility], [place] [address], [place], [place] [address]  Basic metabolic panel     Status: Abnormal   Collection Time: 07/05/21 10:33 AM  Result Value Ref Range   Sodium 136 135 - 145 mmol/L   Potassium 3.7 3.5 - 5.1 mmol/L   Chloride 105 98 - 111 mmol/L   CO2 26 22 - 32 mmol/L   Glucose, Bld 131 (H) 70 - 99 mg/dL    Comment: Glucose reference range applies only to samples taken after fasting for at least 8 hours.   BUN <5 (L) 6 - 20 mg/dL   Creatinine, Ser 0.57 (L) 0.61 - 1.24 mg/dL   Calcium 9.2 8.9 - 10.3 mg/dL   GFR, Estimated >60 >60 mL/min    Comment: (NOTE) Calculated using the CKD-EPI Creatinine Equation (2021)    Anion gap 5 5 - 15    Comment: Performed at [facility], [place] [address]  [place], [place] [address]    Studies/Results: MR 3D Recon At Scanner  Result Date: 07/05/2021 CLINICAL DATA:  Elevated lipase. Evaluate for pancreas divisum anatomy and pancreatic inflammation. History of metastatic renal cell carcinoma EXAM: MRI ABDOMEN WITHOUT AND WITH CONTRAST (INCLUDING MRCP) TECHNIQUE: Multiplanar multisequence MR imaging of the abdomen was performed both before and after the administration of intravenous contrast. Heavily T2-weighted images of the biliary and  pancreatic ducts were obtained, and three-dimensional MRCP images were rendered by post processing. CONTRAST:  8.56mL GADAVIST GADOBUTROL 1 MMOL/ML IV SOLN COMPARISON:  CT AP 07/01/2021 FINDINGS: Lower chest: Trace right pleural effusion and moderate left pleural effusion. 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 Pulmonary nodules are also identified consistent with metastatic disease. Hepatobiliary: No suspicious enhancing liver lesion identified. No gallstones. There is mild edema involving the wall of the gallbladder which measures 4 mm in thickness, image 16/4. No significant bile duct dilatation. Pancreas: There is mild diffuse pancreatic edema. The main pancreatic duct is nondilated and difficult to visualize, which limits assessment for pancreas divisum anatomy. No pancreatic mass identified. No signs of pseudocyst or necrosis. Spleen:  Within normal limits in size and appearance. Adrenals/Urinary Tract: Signs of bilateral adrenal gland metastases identified. The right adrenal lesion measures 2.9 x 1.9 cm, image 50/26. The left adrenal mass measures 6.1 x 2.4 cm, image 50/26. Large necrotic mass is again noted arising off the right kidney. This measures 13.3 x 10.5 cm, image 74/26 and is compatible with known renal cell carcinoma. Solid enhancing lesion within the posterior cortex of the interpolar left kidney measures 2.2 by 2.0 cm, image 20/18. Small bilateral renal cysts are also again noted. No hydronephrosis identified  bilaterally. Stomach/Bowel: Visualized portions within the abdomen are unremarkable. Vascular/Lymphatic: Normal caliber abdominal aorta. The upper abdominal vasculature remains patent. No signs of renal vein thrombosis or thrombus within the IVC. No upper abdominal adenopathy identified. Other:  No significant free fluid or fluid collections. Musculoskeletal: No suspicious bone lesions identified. IMPRESSION: 1. Mild diffuse pancreatic edema compatible with pancreatitis. No signs of pseudocyst or pancreatic necrosis. The main pancreatic duct is difficult to visualize and therefore difficult to assess for pancreas divisum anatomy. 2. Bilateral adrenal gland metastases. 3. Large necrotic mass arising off the right kidney is again noted and is compatible with known renal cell carcinoma. 4. Solid enhancing lesion within the posterior cortex of the interpolar left kidney measures 2.2 by 2.0 cm. Also compatible with renal cell carcinoma. No signs of renal vein thrombosis or thrombus within the IVC. 5. 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 Trace right pleural effusion. 6. Mild edema involving the gallbladder wall which measures 4 mm in thickness, nonspecific in the setting of acute pancreatitis. No gallstones or evidence for bile duct dilatation. Electronically Signed   By: Kerby Moors M.D.   On: 07/05/2021 07:32   MR ABDOMEN MRCP W WO CONTAST  Result Date: 07/05/2021 CLINICAL DATA:  Elevated lipase. Evaluate for pancreas divisum anatomy and pancreatic inflammation. History of metastatic renal cell carcinoma EXAM: MRI ABDOMEN WITHOUT AND WITH CONTRAST (INCLUDING MRCP) TECHNIQUE: Multiplanar multisequence MR imaging of the abdomen was performed both before and after the administration of intravenous contrast. Heavily T2-weighted images of the biliary and pancreatic ducts were obtained, and three-dimensional MRCP images were rendered by post processing. CONTRAST:  8.10mL GADAVIST GADOBUTROL 1 MMOL/ML  IV SOLN COMPARISON:  CT AP 07/01/2021 FINDINGS: Lower chest: Trace right pleural effusion and moderate left pleural effusion. 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 Pulmonary nodules are also identified consistent with metastatic disease. Hepatobiliary: No suspicious enhancing liver lesion identified. No gallstones. There is mild edema involving the wall of the gallbladder which measures 4 mm in thickness, image 16/4. No significant bile duct dilatation. Pancreas: There is mild diffuse pancreatic edema. The main pancreatic duct is nondilated and difficult to visualize, which limits assessment for pancreas divisum anatomy. No pancreatic mass identified. No signs  of pseudocyst or necrosis. Spleen:  Within normal limits in size and appearance. Adrenals/Urinary Tract: Signs of bilateral adrenal gland metastases identified. The right adrenal lesion measures 2.9 x 1.9 cm, image 50/26. The left adrenal mass measures 6.1 x 2.4 cm, image 50/26. Large necrotic mass is again noted arising off the right kidney. This measures 13.3 x 10.5 cm, image 74/26 and is compatible with known renal cell carcinoma. Solid enhancing lesion within the posterior cortex of the interpolar left kidney measures 2.2 by 2.0 cm, image 20/18. Small bilateral renal cysts are also again noted. No hydronephrosis identified bilaterally. Stomach/Bowel: Visualized portions within the abdomen are unremarkable. Vascular/Lymphatic: Normal caliber abdominal aorta. The upper abdominal vasculature remains patent. No signs of renal vein thrombosis or thrombus within the IVC. No upper abdominal adenopathy identified. Other:  No significant free fluid or fluid collections. Musculoskeletal: No suspicious bone lesions identified. IMPRESSION: 1. Mild diffuse pancreatic edema compatible with pancreatitis. No signs of pseudocyst or pancreatic necrosis. The main pancreatic duct is difficult to visualize and therefore difficult to assess for pancreas divisum  anatomy. 2. Bilateral adrenal gland metastases. 3. Large necrotic mass arising off the right kidney is again noted and is compatible with known renal cell carcinoma. 4. Solid enhancing lesion within the posterior cortex of the interpolar left kidney measures 2.2 by 2.0 cm. Also compatible with renal cell carcinoma. No signs of renal vein thrombosis or thrombus within the IVC. 5. 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 Trace right pleural effusion. 6. Mild edema involving the gallbladder wall which measures 4 mm in thickness, nonspecific in the setting of acute pancreatitis. No gallstones or evidence for bile duct dilatation. Electronically Signed   By: Kerby Moors M.D.   On: 07/05/2021 07:32    Medications: I have reviewed the patient's current medications.  Assessment: Abdominal pain and elevated lipase MRI showed mild diffuse pancreatic edema compatible with pancreatitis without evidence of pseudocyst or pancreatic necrosis  EGD unremarkable, biopsies negative H. Pylori  Metastatic renal cell carcinoma  Plan: Will start on low-fat diet Will start MiraLAX twice a day From GI standpoint, we will sign off, please recall if needed.  Ronnette Juniper, MD 07/05/2021, 12:53 PM

## 2021-07-06 ENCOUNTER — Other Ambulatory Visit (HOSPITAL_COMMUNITY): Payer: Self-pay

## 2021-07-06 DIAGNOSIS — R52 Pain, unspecified: Secondary | ICD-10-CM

## 2021-07-06 LAB — CBC WITH DIFFERENTIAL/PLATELET
Abs Immature Granulocytes: 0.02 10*3/uL (ref 0.00–0.07)
Basophils Absolute: 0 10*3/uL (ref 0.0–0.1)
Basophils Relative: 1 %
Eosinophils Absolute: 0.3 10*3/uL (ref 0.0–0.5)
Eosinophils Relative: 7 %
HCT: 37.1 % — ABNORMAL LOW (ref 39.0–52.0)
Hemoglobin: 12 g/dL — ABNORMAL LOW (ref 13.0–17.0)
Immature Granulocytes: 1 %
Lymphocytes Relative: 23 %
Lymphs Abs: 1 10*3/uL (ref 0.7–4.0)
MCH: 23.9 pg — ABNORMAL LOW (ref 26.0–34.0)
MCHC: 32.3 g/dL (ref 30.0–36.0)
MCV: 73.8 fL — ABNORMAL LOW (ref 80.0–100.0)
Monocytes Absolute: 0.6 10*3/uL (ref 0.1–1.0)
Monocytes Relative: 14 %
Neutro Abs: 2.4 10*3/uL (ref 1.7–7.7)
Neutrophils Relative %: 54 %
Platelets: 285 10*3/uL (ref 150–400)
RBC: 5.03 MIL/uL (ref 4.22–5.81)
RDW: 23.1 % — ABNORMAL HIGH (ref 11.5–15.5)
WBC: 4.3 10*3/uL (ref 4.0–10.5)
nRBC: 0 % (ref 0.0–0.2)

## 2021-07-06 MED ORDER — POLYETHYLENE GLYCOL 3350 17 G PO PACK: 17.0000 g | PACK | Freq: Two times a day (BID) | ORAL | 0 refills | Status: DC

## 2021-07-06 MED ORDER — OXYCODONE-ACETAMINOPHEN 5-325 MG PO TABS: 1.0000 | ORAL_TABLET | ORAL | 0 refills | Status: DC | PRN

## 2021-07-06 MED ORDER — ONDANSETRON HCL 4 MG PO TABS
4.0000 mg | ORAL_TABLET | Freq: Four times a day (QID) | ORAL | 0 refills | Status: DC | PRN
Start: 2021-07-06 — End: 2021-08-14

## 2021-07-06 NOTE — Progress Notes (Signed)
D/C instructions given to patient. Patient had no questions. NT wheeling patient out. His ride is waiting on his outside at the front entrance.

## 2021-07-06 NOTE — Discharge Summary (Signed)
Physician Discharge Summary  Dylan Good QQI:[id number] DOB: September 07, 1970 DOA: 06/30/2021  PCP: [name], No  Admit date: 06/30/2021 Discharge date: 07/06/2021  Admitted From: Home Disposition: Home   Recommendations for Outpatient Follow-up:  Follow up with PCP in 1-2 weeks Please obtain BMP/CBC/lipase in one week  Home Health: None Equipment/Devices: None  Discharge Condition: Stable CODE STATUS: Full code Diet recommendation: Low-fat diet Brief/Interim Summary:Patient is a 50 year old African-American male with a past medical history significant for but not limited to hypertension, 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  He was admitted a few weeks ago and was suspected to have pancreatitis in the setting of his chemotherapy. He was discharged home on oral pain medications. He presents with similar complaints of nausea, vomiting and abdominal pain.  He underwent EGD which was unremarkable.  He also underwent MRI showing diffuse pancreatitis.   Discharge Diagnoses:  Principal Problem:   Intractable pain Active Problems:   Kidney cancer, primary, with metastasis from kidney to other site [name])   Brain metastases ([place])   Pancreatitis   Hypertension   Tobacco use   Acute pancreatitis  He was Acute recurrent pancreatitis-he was treated with IV fluids pain control with morphine and oxycodone antiemetics and IV Protonix.  Seen in consultation by GI.  Patient had elevated lipase with epigastric pain and normal liver enzymes.  MRI MRCP showed diffuse pancreatic edema compatible with pancreatitis.  No signs of pseudocyst or pancreatic necrosis.  Patient tolerated full liquid diet during the hospital stay.  He was discharged on a low-fat diet.    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 as an outpatient.     Essential hypertension blood pressure 134/70 stable.   Hypophosphatemia Replaced   Anemia of chronic disease Stable   Tobacco abuse counseled  He feels he is not ready to quit.  Estimated body mass index is 25.69 kg/m as calculated from the following:   Height as of this encounter: 5\' 9"  (1.753 m).   Weight as of this encounter: 78.9 kg.  Discharge Instructions  Discharge Instructions     Diet - low sodium heart healthy   Complete by: As directed    Diet - low sodium heart healthy   Complete by: As directed    Increase activity slowly   Complete by: As directed    Increase activity slowly   Complete by: As directed       Allergies as of 07/06/2021   No Known Allergies      Medication List     STOP taking these medications    acetaminophen 500 MG tablet Commonly known as: TYLENOL   oxyCODONE 5 MG immediate release tablet Commonly known as: Roxicodone       TAKE these medications    amLODipine 5 MG tablet Commonly known as: NORVASC TAKE 1 TABLET (5 MG TOTAL) BY MOUTH DAILY.   diclofenac Sodium 1 % Gel Commonly known as: VOLTAREN Apply 2 g topically 4 (four) times daily. What changed:  when to take this reasons to take this   docusate sodium 100 MG capsule Commonly known as: COLACE Take 1 capsule (100 mg total) by mouth 2 (two) times daily. What changed:  when to take this reasons to take this   ondansetron 4 MG tablet Commonly known as: ZOFRAN Take 1  tablet (4 mg total) by mouth every 6 (six) hours as needed for nausea.   oxyCODONE-acetaminophen 5-325 MG tablet Commonly known as: PERCOCET/ROXICET Take 1 tablet by mouth every 4 (four) hours as needed for moderate pain or severe pain.   pantoprazole 40 MG tablet Commonly known as: Protonix Take 1 tablet (40 mg total) by mouth daily. What changed:  when to take this reasons to take this   polyethylene glycol 17 g  packet Commonly known as: MIRALAX / GLYCOLAX Take 17 g by mouth 2 (two) times daily. What changed: when to take this        No Known Allergies  Consultations: GI   Procedures/Studies: MR BRAIN W WO CONTRAST  Result Date: 06/13/2021 CLINICAL DATA:  SRS restaging RCC metastases. Status post radiation 02/24/2021 EXAM: MRI HEAD WITHOUT AND WITH CONTRAST TECHNIQUE: Multiplanar, multiecho pulse sequences of the brain and surrounding structures were obtained without and with intravenous contrast. CONTRAST:  103mL MULTIHANCE GADOBENATE DIMEGLUMINE 529 MG/ML IV SOLN COMPARISON:  01/19/2021 FINDINGS: Brain: No restricted diffusion to suggest acute or subacute infarct. Previously noted enhancing lesion in the left temporal lobe is now T1 hypointense and now no longer enhances (series 12, image 83), with minimal adjacent enhancement (series 12, image 81), which may be post treatment related, and resolution of previously noted adjacent edema. Additional previously noted punctate focus of enhancement in the medial right temporal lobe no longer definitively enhances (series 12, image 70). Small T1 hyperintense focus in the medial left cerebellar hemisphere (series 12, image 61), measuring 5 x 2 mm (series 9, image 65). An adjacent developmental venous anomaly was present on the prior exam, but the small T1 hyperintense was not. The focus is associated with susceptibility (series 6, image 35). No acute hemorrhage, mass, mass effect, or midline shift. No extra-axial collection or hydrocephalus. T2 hyperintense signal in the periventricular white matter, likely the sequela of chronic small vessel ischemic disease. Vascular: Normal flow voids. Skull and upper cervical spine: Normal marrow signal. Sinuses/Orbits: Negative. Other: The mastoids are well aerated. IMPRESSION: 1. Previously noted enhancing lesion in the left temporal lobe no longer enhances, with minimal adjacent enhancement, likely post treatment.  Additional punctate focus of enhancement in the medial right temporal lobe is also no longer seen. 2. T1 hyperintense focus with associated susceptibility in the medial left cerebellum, which is suspicious for metastasis given development since July, however this appearance could reflect hemorrhage into a cavernoma given adjacent DVA. Electronically Signed   By: Merilyn Baba M.D.   On: 06/13/2021 14:19   CT Abdomen Pelvis W Contrast  Result Date: 07/01/2021 CLINICAL DATA:  Pancreatitis, persistent.  Abdominal pain EXAM: CT ABDOMEN AND PELVIS WITH CONTRAST TECHNIQUE: Multidetector CT imaging of the abdomen and pelvis was performed using the standard protocol following bolus administration of intravenous contrast. CONTRAST:  64mL OMNIPAQUE IOHEXOL 350 MG/ML SOLN COMPARISON:  06/19/2021 FINDINGS: Lower chest: Pleural base masses are again seen in the left lower hemithorax. The superior larger. Largest nodule measures 2.9 cm. This was not as well visualized on prior study but approximate size 2.6 cm. Small left pleural effusion, slightly increased since prior study. Spiculated mass in the left lower lobe measures up to 2.3 cm compared to 2 cm previously. Multiple other lower lobe pulmonary nodules appear similar to prior study. Hepatobiliary: No focal hepatic abnormality. Gallbladder unremarkable. Pancreas: No focal abnormality or ductal dilatation. No surrounding inflammation. Spleen: No focal abnormality.  Normal size. Adrenals/Urinary Tract: Large heterogeneous partially necrotic mass again seen  arising from the right kidney measures approximately 11 x 11 cm. This does not appear appreciably changed since prior study. Bilateral adrenal masses and solid left renal mass again noted, unchanged. No hydronephrosis. Suspect small calcification layering dependently in the bladder posteriorly. This measures approximately 2 mm. Stomach/Bowel: Stomach, large and small bowel grossly unremarkable. Vascular/Lymphatic: No  evidence of aneurysm or adenopathy. Reproductive: No visible focal abnormality. Other: No free fluid or free air. Musculoskeletal: No acute bony abnormality. IMPRESSION: Large right necrotic renal mass again noted, not significantly changed. Stable solid left renal mass, bilateral adrenal metastases, and several pulmonary nodules visualized in the lower lungs. Numerous pleural based lesions in the left lower chest appears slightly more prominent than prior study with small left pleural effusion increasing slightly. 2 mm calcification layering dependently in the posterior bladder, presumably bladder stone. No hydronephrosis. No acute findings. Electronically Signed   By: Rolm Baptise M.D.   On: 07/01/2021 05:12   CT ABDOMEN PELVIS W CONTRAST  Result Date: 06/19/2021 CLINICAL DATA:  Abdominal pain, acute, nonlocalized. History of metastatic renal cell carcinoma on chemotherapy EXAM: CT ABDOMEN AND PELVIS WITH CONTRAST TECHNIQUE: Multidetector CT imaging of the abdomen and pelvis was performed using the standard protocol following bolus administration of intravenous contrast. CONTRAST:  30mL OMNIPAQUE IOHEXOL 350 MG/ML SOLN COMPARISON:  CT 04/16/2021 FINDINGS: Lower chest: Multiple bilateral pulmonary nodules within the included lung bases, overall decreased in size and number compared to the previous CT of 04/16/2021. For reference, medial left lower lobe nodule measures 2.0 x 1.3 cm (series 3, image 42) previously 3.7 x 3.6 cm). Nodule within the medial right lung base inferiorly measures 0.8 cm (series 3, image 29), previously 1.4 cm. Trace left-sided pleural effusion, slightly decreased. Previously seen pleural based masses along the left lower lobe have also significantly decreased in size from prior. Partially visualized expansile mass of the lateral aspect of the left chest wall with destructive changes of the left fifth rib, incompletely characterized at the edge of the field of view. Heart size is normal.  Hepatobiliary: No focal liver abnormality is seen. No gallstones, gallbladder wall thickening, or biliary dilatation. Pancreas: Unremarkable. No pancreatic ductal dilatation or surrounding inflammatory changes. Spleen: Normal in size without focal abnormality. Adrenals/Urinary Tract: Left adrenal mass measures approximately 5.2 x 2.1 cm (previously 6.4 x 2.9 cm). Right adrenal mass measures 3.2 x 1.6 cm (previously 3.5 x 2.1 cm). Large heterogeneous centrally necrotic mass with multiple calcifications arising from the right kidney measuring approximately 13.0 x 10.4 cm trans axially (previously 15 x 13.5 cm). Mild right hydronephrosis. Solid left renal mass measures 2.3 x 2.2 cm trans axially (previously 2.9 x 2.9 cm). No left-sided hydronephrosis. Urinary bladder is within normal limits. Stomach/Bowel: Stomach is within normal limits. Appendix is surgically absent. No evidence of bowel wall thickening, distention, or inflammatory changes. Vascular/Lymphatic: Interval decrease in size and number of retroperitoneal lymph nodes. Reference nodes includes 1.0 cm left para-aortic node (series 2, image 46). Scattered atherosclerotic calcifications throughout the aortoiliac axis without aneurysm. Reproductive: Prostate is unremarkable. Other: No ascites.  No abscess.  No free air. Musculoskeletal: Destructive lesion centered at the left fifth rib, as above. Chronic changes of bilateral sacroiliitis. No new suspicious bone lesion. IMPRESSION: 1. Findings compatible with favorable response to treatment with interval decrease in size of bilateral renal masses, bilateral adrenal gland masses, and decreasing retroperitoneal adenopathy. There is also a significant interval decrease in size and number of bilateral pulmonary nodules and left sided pleural based  masses within the visualized lung bases. 2. Partially visualized expansile mass of the lateral aspect of the left chest wall with destructive changes of the left fifth rib,  incompletely characterized at the edge of the field of view. 3. Mild right hydronephrosis. 4. Aortic atherosclerosis (ICD10-I70.0). Electronically Signed   By: Davina Poke D.O.   On: 06/19/2021 07:47   MR 3D Recon At Scanner  Result Date: 07/05/2021 CLINICAL DATA:  Elevated lipase. Evaluate for pancreas divisum anatomy and pancreatic inflammation. History of metastatic renal cell carcinoma EXAM: MRI ABDOMEN WITHOUT AND WITH CONTRAST (INCLUDING MRCP) TECHNIQUE: Multiplanar multisequence MR imaging of the abdomen was performed both before and after the administration of intravenous contrast. Heavily T2-weighted images of the biliary and pancreatic ducts were obtained, and three-dimensional MRCP images were rendered by post processing. CONTRAST:  8.91mL GADAVIST GADOBUTROL 1 MMOL/ML IV SOLN COMPARISON:  CT AP 07/01/2021 FINDINGS: Lower chest: Trace right pleural effusion and moderate left pleural effusion. 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 Pulmonary nodules are also identified consistent with metastatic disease. Hepatobiliary: No suspicious enhancing liver lesion identified. No gallstones. There is mild edema involving the wall of the gallbladder which measures 4 mm in thickness, image 16/4. No significant bile duct dilatation. Pancreas: There is mild diffuse pancreatic edema. The main pancreatic duct is nondilated and difficult to visualize, which limits assessment for pancreas divisum anatomy. No pancreatic mass identified. No signs of pseudocyst or necrosis. Spleen:  Within normal limits in size and appearance. Adrenals/Urinary Tract: Signs of bilateral adrenal gland metastases identified. The right adrenal lesion measures 2.9 x 1.9 cm, image 50/26. The left adrenal mass measures 6.1 x 2.4 cm, image 50/26. Large necrotic mass is again noted arising off the right kidney. This measures 13.3 x 10.5 cm, image 74/26 and is compatible with known renal cell carcinoma. Solid enhancing lesion within the  posterior cortex of the interpolar left kidney measures 2.2 by 2.0 cm, image 20/18. Small bilateral renal cysts are also again noted. No hydronephrosis identified bilaterally. Stomach/Bowel: Visualized portions within the abdomen are unremarkable. Vascular/Lymphatic: Normal caliber abdominal aorta. The upper abdominal vasculature remains patent. No signs of renal vein thrombosis or thrombus within the IVC. No upper abdominal adenopathy identified. Other:  No significant free fluid or fluid collections. Musculoskeletal: No suspicious bone lesions identified. IMPRESSION: 1. Mild diffuse pancreatic edema compatible with pancreatitis. No signs of pseudocyst or pancreatic necrosis. The main pancreatic duct is difficult to visualize and therefore difficult to assess for pancreas divisum anatomy. 2. Bilateral adrenal gland metastases. 3. Large necrotic mass arising off the right kidney is again noted and is compatible with known renal cell carcinoma. 4. Solid enhancing lesion within the posterior cortex of the interpolar left kidney measures 2.2 by 2.0 cm. Also compatible with renal cell carcinoma. No signs of renal vein thrombosis or thrombus within the IVC. 5. 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 Trace right pleural effusion. 6. Mild edema involving the gallbladder wall which measures 4 mm in thickness, nonspecific in the setting of acute pancreatitis. No gallstones or evidence for bile duct dilatation. Electronically Signed   By: Kerby Moors M.D.   On: 07/05/2021 07:32   MR ABDOMEN MRCP W WO CONTAST  Result Date: 07/05/2021 CLINICAL DATA:  Elevated lipase. Evaluate for pancreas divisum anatomy and pancreatic inflammation. History of metastatic renal cell carcinoma EXAM: MRI ABDOMEN WITHOUT AND WITH CONTRAST (INCLUDING MRCP) TECHNIQUE: Multiplanar multisequence MR imaging of the abdomen was performed both before and after the administration of  intravenous contrast. Heavily T2-weighted images of  the biliary and pancreatic ducts were obtained, and three-dimensional MRCP images were rendered by post processing. CONTRAST:  8.76mL GADAVIST GADOBUTROL 1 MMOL/ML IV SOLN COMPARISON:  CT AP 07/01/2021 FINDINGS: Lower chest: Trace right pleural effusion and moderate left pleural effusion. 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 Pulmonary nodules are also identified consistent with metastatic disease. Hepatobiliary: No suspicious enhancing liver lesion identified. No gallstones. There is mild edema involving the wall of the gallbladder which measures 4 mm in thickness, image 16/4. No significant bile duct dilatation. Pancreas: There is mild diffuse pancreatic edema. The main pancreatic duct is nondilated and difficult to visualize, which limits assessment for pancreas divisum anatomy. No pancreatic mass identified. No signs of pseudocyst or necrosis. Spleen:  Within normal limits in size and appearance. Adrenals/Urinary Tract: Signs of bilateral adrenal gland metastases identified. The right adrenal lesion measures 2.9 x 1.9 cm, image 50/26. The left adrenal mass measures 6.1 x 2.4 cm, image 50/26. Large necrotic mass is again noted arising off the right kidney. This measures 13.3 x 10.5 cm, image 74/26 and is compatible with known renal cell carcinoma. Solid enhancing lesion within the posterior cortex of the interpolar left kidney measures 2.2 by 2.0 cm, image 20/18. Small bilateral renal cysts are also again noted. No hydronephrosis identified bilaterally. Stomach/Bowel: Visualized portions within the abdomen are unremarkable. Vascular/Lymphatic: Normal caliber abdominal aorta. The upper abdominal vasculature remains patent. No signs of renal vein thrombosis or thrombus within the IVC. No upper abdominal adenopathy identified. Other:  No significant free fluid or fluid collections. Musculoskeletal: No suspicious bone lesions identified. IMPRESSION: 1. Mild diffuse pancreatic edema compatible with  pancreatitis. No signs of pseudocyst or pancreatic necrosis. The main pancreatic duct is difficult to visualize and therefore difficult to assess for pancreas divisum anatomy. 2. Bilateral adrenal gland metastases. 3. Large necrotic mass arising off the right kidney is again noted and is compatible with known renal cell carcinoma. 4. Solid enhancing lesion within the posterior cortex of the interpolar left kidney measures 2.2 by 2.0 cm. Also compatible with renal cell carcinoma. No signs of renal vein thrombosis or thrombus within the IVC. 5. 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 Trace right pleural effusion. 6. Mild edema involving the gallbladder wall which measures 4 mm in thickness, nonspecific in the setting of acute pancreatitis. No gallstones or evidence for bile duct dilatation. Electronically Signed   By: Kerby Moors M.D.   On: 07/05/2021 07:32   DG HIPS BILAT WITH PELVIS 3-4 VIEWS  Result Date: 06/21/2021 CLINICAL DATA:  Bilateral hip pain for 2-3 days EXAM: DG HIP (WITH OR WITHOUT PELVIS) 3-4V BILAT COMPARISON:  None. FINDINGS: Frontal view the pelvis as well as frontal and frogleg lateral views of both hips are obtained. No fracture, subluxation, or dislocation within either hip. There is mild symmetrical osteoarthritis. Remainder of the bony pelvis is unremarkable. Sacroiliac joints are normal. IMPRESSION: 1. Mild symmetrical bilateral hip osteoarthritis. No acute bony abnormalities. Electronically Signed   By: Randa Ngo M.D.   On: 06/21/2021 15:04   (Echo, Carotid, EGD, Colonoscopy, ERCP)    Subjective:  Patient is resting in bed in no acute distress he is tolerating full liquid diet Discharge Exam: Vitals:   07/05/21 2125 07/06/21 0522  BP: (!) 147/99 134/70  Pulse: 74 94  Resp: 18 18  Temp: 97.7 F (36.5 C) 97.9 F (36.6 C)  SpO2: 98% 100%   Vitals:   07/05/21 0542 07/05/21 1332  07/05/21 2125 07/06/21 0522  BP: 137/90 (!) 142/98 (!) 147/99 134/70   Pulse: 77 76 74 94  Resp: 18 18 18 18   Temp: 98.3 F (36.8 C) 98.6 F (37 C) 97.7 F (36.5 C) 97.9 F (36.6 C)  TempSrc: Oral  Oral Oral  SpO2: 99% 99% 98% 100%  Weight:      Height:        General: Pt is alert, awake, not in acute distress Cardiovascular: RRR, S1/S2 +, no rubs, no gallops Respiratory: CTA bilaterally, no wheezing, no rhonchi Abdominal: Soft, NT, ND, bowel sounds + Extremities: no edema, no cyanosis    The results of significant diagnostics from this hospitalization (including imaging, microbiology, ancillary and laboratory) are listed below for reference.     Microbiology: Recent Results (from the past 240 hour(s))  Resp Panel by RT-PCR (Flu A&B, Covid) Nasopharyngeal Swab     Status: None   Collection Time: 06/27/21  2:05 AM   Specimen: Nasopharyngeal Swab; Nasopharyngeal(NP) swabs in vial transport medium  Result Value Ref Range Status   SARS Coronavirus 2 by RT PCR NEGATIVE NEGATIVE Final    Comment: (NOTE) SARS-CoV-2 target nucleic acids are NOT DETECTED.  The SARS-CoV-2 RNA is generally detectable in upper respiratory specimens during the acute phase of infection. The lowest concentration of SARS-CoV-2 viral copies this assay can detect is 138 copies/mL. A negative result does not preclude SARS-Cov-2 infection and should not be used as the sole basis for treatment or other patient management decisions. A negative result may occur with  improper specimen collection/handling, submission of specimen other than nasopharyngeal swab, presence of viral mutation(s) within the areas targeted by this assay, and inadequate number of viral copies(<138 copies/mL). A negative result must be combined with clinical observations, patient history, and epidemiological information. The expected result is Negative.  Fact Sheet for Patients:  EntrepreneurPulse.com.au  Fact Sheet for Healthcare Providers:   IncredibleEmployment.be  This test is no t yet approved or cleared by the [place] FDA and  has been authorized for detection and/or diagnosis of SARS-CoV-2 by FDA under an Emergency Use Authorization (EUA). This EUA will remain  in effect (meaning this test can be used) for the duration of the COVID-19 declaration under Section 564(b)(1) of the Act, 21 U.S.C.section 360bbb-3(b)(1), unless the authorization is terminated  or revoked sooner.       Influenza A by PCR NEGATIVE NEGATIVE Final   Influenza B by PCR NEGATIVE NEGATIVE Final    Comment: (NOTE) The Xpert Xpress SARS-CoV-2/FLU/RSV plus assay is intended as an aid in the diagnosis of influenza from Nasopharyngeal swab specimens and should not be used as a sole basis for treatment. Nasal washings and aspirates are unacceptable for Xpert Xpress SARS-CoV-2/FLU/RSV testing.  Fact Sheet for Patients: EntrepreneurPulse.com.au  Fact Sheet for Healthcare Providers: IncredibleEmployment.be  This test is not yet approved or cleared by the [place] FDA and has been authorized for detection and/or diagnosis of SARS-CoV-2 by FDA under an Emergency Use Authorization (EUA). This EUA will remain in effect (meaning this test can be used) for the duration of the COVID-19 declaration under Section 564(b)(1) of the Act, 21 U.S.C. section 360bbb-3(b)(1), unless the authorization is terminated or revoked.  Performed at [facility], [place] [address], [place], [place] [address]   Resp Panel by RT-PCR (Flu A&B, Covid) Nasopharyngeal Swab     Status: None   Collection Time: 07/01/21  6:15 AM   Specimen: Nasopharyngeal Swab; Nasopharyngeal(NP) swabs in vial transport  medium  Result Value Ref Range Status   SARS Coronavirus 2 by RT PCR NEGATIVE NEGATIVE Final    Comment: (NOTE) SARS-CoV-2 target nucleic acids are NOT DETECTED.  The SARS-CoV-2 RNA is generally  detectable in upper respiratory specimens during the acute phase of infection. The lowest concentration of SARS-CoV-2 viral copies this assay can detect is 138 copies/mL. A negative result does not preclude SARS-Cov-2 infection and should not be used as the sole basis for treatment or other patient management decisions. A negative result may occur with  improper specimen collection/handling, submission of specimen other than nasopharyngeal swab, presence of viral mutation(s) within the areas targeted by this assay, and inadequate number of viral copies(<138 copies/mL). A negative result must be combined with clinical observations, patient history, and epidemiological information. The expected result is Negative.  Fact Sheet for Patients:  EntrepreneurPulse.com.au  Fact Sheet for Healthcare Providers:  IncredibleEmployment.be  This test is no t yet approved or cleared by the [place] FDA and  has been authorized for detection and/or diagnosis of SARS-CoV-2 by FDA under an Emergency Use Authorization (EUA). This EUA will remain  in effect (meaning this test can be used) for the duration of the COVID-19 declaration under Section 564(b)(1) of the Act, 21 U.S.C.section 360bbb-3(b)(1), unless the authorization is terminated  or revoked sooner.       Influenza A by PCR NEGATIVE NEGATIVE Final   Influenza B by PCR NEGATIVE NEGATIVE Final    Comment: (NOTE) The Xpert Xpress SARS-CoV-2/FLU/RSV plus assay is intended as an aid in the diagnosis of influenza from Nasopharyngeal swab specimens and should not be used as a sole basis for treatment. Nasal washings and aspirates are unacceptable for Xpert Xpress SARS-CoV-2/FLU/RSV testing.  Fact Sheet for Patients: EntrepreneurPulse.com.au  Fact Sheet for Healthcare Providers: IncredibleEmployment.be  This test is not yet approved or cleared by the [place] FDA  and has been authorized for detection and/or diagnosis of SARS-CoV-2 by FDA under an Emergency Use Authorization (EUA). This EUA will remain in effect (meaning this test can be used) for the duration of the COVID-19 declaration under Section 564(b)(1) of the Act, 21 U.S.C. section 360bbb-3(b)(1), unless the authorization is terminated or revoked.  Performed at [facility], [place] [address], [place], [place] [address]      Labs: BNP (last 3 results) No results for input(s): BNP in the last 8760 hours. Basic Metabolic Panel: Recent Labs  Lab 07/01/21 0213 07/02/21 0412 07/03/21 0422 07/05/21 1033  NA 134* 135 135 136  K 3.9 3.9 3.8 3.7  CL 101 105 106 105  CO2 23 23 22 26   GLUCOSE 100* 81 94 131*  BUN 5* <5* <5* <5*  CREATININE 0.64 0.51* 0.69 0.57*  CALCIUM 8.9 8.4* 8.6* 9.2  MG 2.0  --  1.7  --   PHOS 2.7  --  2.4*  --    Liver Function Tests: Recent Labs  Lab 07/01/21 0213 07/02/21 0412 07/03/21 0422  AST 13* 9* 11*  ALT 12 10 8   ALKPHOS 83 64 61  BILITOT 1.3* 1.4* 1.2  PROT 8.4* 6.6 7.1  ALBUMIN 3.8 3.1* 3.2*   Recent Labs  Lab 07/01/21 0213 07/02/21 0412 07/03/21 0422 07/04/21 1243 07/05/21 1033  LIPASE 382* 248* 309* 369* 339*   No results for input(s): AMMONIA in the last 168 hours. CBC: Recent Labs  Lab 07/02/21 0412 07/03/21 0422 07/04/21 0422 07/05/21 0421 07/06/21 0443  WBC 3.4* 3.9* 3.8* 4.0 4.3  NEUTROABS 1.7  2.0 2.0 1.8 2.4  HGB 11.8* 11.5* 11.2* 11.4* 12.0*  HCT 37.3* 36.2* 35.6* 36.4* 37.1*  MCV 73.6* 73.9* 73.6* 74.6* 73.8*  PLT 249 254 263 255 285   Cardiac Enzymes: No results for input(s): CKTOTAL, CKMB, CKMBINDEX, TROPONINI in the last 168 hours. BNP: Invalid input(s): POCBNP CBG: No results for input(s): GLUCAP in the last 168 hours. D-Dimer No results for input(s): DDIMER in the last 72 hours. Hgb A1c No results for input(s): HGBA1C in the last 72 hours. Lipid Profile No results for input(s):  CHOL, HDL, LDLCALC, TRIG, CHOLHDL, LDLDIRECT in the last 72 hours. Thyroid function studies No results for input(s): TSH, T4TOTAL, T3FREE, THYROIDAB in the last 72 hours.  Invalid input(s): FREET3 Anemia work up No results for input(s): VITAMINB12, FOLATE, FERRITIN, TIBC, IRON, RETICCTPCT in the last 72 hours. Urinalysis    Component Value Date/Time   COLORURINE YELLOW 07/01/2021 [place] 07/01/2021 0213   LABSPEC 1.010 07/01/2021 0213   PHURINE 6.0 07/01/2021 0213   GLUCOSEU NEGATIVE 07/01/2021 0213   HGBUR LARGE (A) 07/01/2021 0213   BILIRUBINUR NEGATIVE 07/01/2021 0213   KETONESUR 20 (A) 07/01/2021 0213   PROTEINUR 30 (A) 07/01/2021 0213   NITRITE NEGATIVE 07/01/2021 0213   LEUKOCYTESUR SMALL (A) 07/01/2021 0213   Sepsis Labs Invalid input(s): PROCALCITONIN,  WBC,  LACTICIDVEN Microbiology Recent Results (from the past 240 hour(s))  Resp Panel by RT-PCR (Flu A&B, Covid) Nasopharyngeal Swab     Status: None   Collection Time: 06/27/21  2:05 AM   Specimen: Nasopharyngeal Swab; Nasopharyngeal(NP) swabs in vial transport medium  Result Value Ref Range Status   SARS Coronavirus 2 by RT PCR NEGATIVE NEGATIVE Final    Comment: (NOTE) SARS-CoV-2 target nucleic acids are NOT DETECTED.  The SARS-CoV-2 RNA is generally detectable in upper respiratory specimens during the acute phase of infection. The lowest concentration of SARS-CoV-2 viral copies this assay can detect is 138 copies/mL. A negative result does not preclude SARS-Cov-2 infection and should not be used as the sole basis for treatment or other patient management decisions. A negative result may occur with  improper specimen collection/handling, submission of specimen other than nasopharyngeal swab, presence of viral mutation(s) within the areas targeted by this assay, and inadequate number of viral copies(<138 copies/mL). A negative result must be combined with clinical observations, patient history,  and epidemiological information. The expected result is Negative.  Fact Sheet for Patients:  EntrepreneurPulse.com.au  Fact Sheet for Healthcare Providers:  IncredibleEmployment.be  This test is no t yet approved or cleared by the [place] FDA and  has been authorized for detection and/or diagnosis of SARS-CoV-2 by FDA under an Emergency Use Authorization (EUA). This EUA will remain  in effect (meaning this test can be used) for the duration of the COVID-19 declaration under Section 564(b)(1) of the Act, 21 U.S.C.section 360bbb-3(b)(1), unless the authorization is terminated  or revoked sooner.       Influenza A by PCR NEGATIVE NEGATIVE Final   Influenza B by PCR NEGATIVE NEGATIVE Final    Comment: (NOTE) The Xpert Xpress SARS-CoV-2/FLU/RSV plus assay is intended as an aid in the diagnosis of influenza from Nasopharyngeal swab specimens and should not be used as a sole basis for treatment. Nasal washings and aspirates are unacceptable for Xpert Xpress SARS-CoV-2/FLU/RSV testing.  Fact Sheet for Patients: EntrepreneurPulse.com.au  Fact Sheet for Healthcare Providers: IncredibleEmployment.be  This test is not yet approved or cleared by the [place] and has been authorized  for detection and/or diagnosis of SARS-CoV-2 by FDA under an Emergency Use Authorization (EUA). This EUA will remain in effect (meaning this test can be used) for the duration of the COVID-19 declaration under Section 564(b)(1) of the Act, 21 U.S.C. section 360bbb-3(b)(1), unless the authorization is terminated or revoked.  Performed at [facility], [place] [address] [place], [place] [address]   Resp Panel by RT-PCR (Flu A&B, Covid) Nasopharyngeal Swab     Status: None   Collection Time: 07/01/21  6:15 AM   Specimen: Nasopharyngeal Swab; Nasopharyngeal(NP) swabs in vial transport medium  Result Value  Ref Range Status   SARS Coronavirus 2 by RT PCR NEGATIVE NEGATIVE Final    Comment: (NOTE) SARS-CoV-2 target nucleic acids are NOT DETECTED.  The SARS-CoV-2 RNA is generally detectable in upper respiratory specimens during the acute phase of infection. The lowest concentration of SARS-CoV-2 viral copies this assay can detect is 138 copies/mL. A negative result does not preclude SARS-Cov-2 infection and should not be used as the sole basis for treatment or other patient management decisions. A negative result may occur with  improper specimen collection/handling, submission of specimen other than nasopharyngeal swab, presence of viral mutation(s) within the areas targeted by this assay, and inadequate number of viral copies(<138 copies/mL). A negative result must be combined with clinical observations, patient history, and epidemiological information. The expected result is Negative.  Fact Sheet for Patients:  EntrepreneurPulse.com.au  Fact Sheet for Healthcare Providers:  IncredibleEmployment.be  This test is no t yet approved or cleared by the [place] FDA and  has been authorized for detection and/or diagnosis of SARS-CoV-2 by FDA under an Emergency Use Authorization (EUA). This EUA will remain  in effect (meaning this test can be used) for the duration of the COVID-19 declaration under Section 564(b)(1) of the Act, 21 U.S.C.section 360bbb-3(b)(1), unless the authorization is terminated  or revoked sooner.       Influenza A by PCR NEGATIVE NEGATIVE Final   Influenza B by PCR NEGATIVE NEGATIVE Final    Comment: (NOTE) The Xpert Xpress SARS-CoV-2/FLU/RSV plus assay is intended as an aid in the diagnosis of influenza from Nasopharyngeal swab specimens and should not be used as a sole basis for treatment. Nasal washings and aspirates are unacceptable for Xpert Xpress SARS-CoV-2/FLU/RSV testing.  Fact Sheet for  Patients: EntrepreneurPulse.com.au  Fact Sheet for Healthcare Providers: IncredibleEmployment.be  This test is not yet approved or cleared by the [place] FDA and has been authorized for detection and/or diagnosis of SARS-CoV-2 by FDA under an Emergency Use Authorization (EUA). This EUA will remain in effect (meaning this test can be used) for the duration of the COVID-19 declaration under Section 564(b)(1) of the Act, 21 U.S.C. section 360bbb-3(b)(1), unless the authorization is terminated or revoked.  Performed at [facility], [place] [address] [place],  [address]      Time coordinating discharge: 39 minutes  SIGNED:   Georgette Shell, MD  [facility] 07/06/2021, 4:42 PM

## 2021-07-11 ENCOUNTER — Other Ambulatory Visit (HOSPITAL_COMMUNITY): Payer: Self-pay

## 2021-07-16 ENCOUNTER — Telehealth: Payer: Self-pay | Admitting: Oncology

## 2021-07-16 ENCOUNTER — Other Ambulatory Visit: Payer: Self-pay | Admitting: Physician Assistant

## 2021-07-16 DIAGNOSIS — I10 Essential (primary) hypertension: Secondary | ICD-10-CM

## 2021-07-16 NOTE — Telephone Encounter (Signed)
Sch per 1/9 inbasket, pt aware

## 2021-07-17 ENCOUNTER — Encounter: Payer: Self-pay | Admitting: Oncology

## 2021-07-19 ENCOUNTER — Other Ambulatory Visit (HOSPITAL_COMMUNITY): Payer: Self-pay

## 2021-07-19 ENCOUNTER — Other Ambulatory Visit: Payer: Self-pay

## 2021-07-19 ENCOUNTER — Inpatient Hospital Stay (HOSPITAL_BASED_OUTPATIENT_CLINIC_OR_DEPARTMENT_OTHER): Payer: Medicaid Other | Admitting: Oncology

## 2021-07-19 ENCOUNTER — Telehealth: Payer: Self-pay | Admitting: *Deleted

## 2021-07-19 ENCOUNTER — Inpatient Hospital Stay: Payer: Medicaid Other | Attending: Oncology

## 2021-07-19 VITALS — BP 144/83 | HR 118 | Temp 97.9°F | Resp 17 | Ht 69.0 in | Wt 164.7 lb

## 2021-07-19 DIAGNOSIS — J90 Pleural effusion, not elsewhere classified: Secondary | ICD-10-CM | POA: Diagnosis not present

## 2021-07-19 DIAGNOSIS — F17210 Nicotine dependence, cigarettes, uncomplicated: Secondary | ICD-10-CM | POA: Insufficient documentation

## 2021-07-19 DIAGNOSIS — C7931 Secondary malignant neoplasm of brain: Secondary | ICD-10-CM | POA: Diagnosis not present

## 2021-07-19 DIAGNOSIS — N2889 Other specified disorders of kidney and ureter: Secondary | ICD-10-CM

## 2021-07-19 DIAGNOSIS — C641 Malignant neoplasm of right kidney, except renal pelvis: Secondary | ICD-10-CM | POA: Diagnosis not present

## 2021-07-19 DIAGNOSIS — Z923 Personal history of irradiation: Secondary | ICD-10-CM | POA: Diagnosis not present

## 2021-07-19 DIAGNOSIS — C7800 Secondary malignant neoplasm of unspecified lung: Secondary | ICD-10-CM | POA: Insufficient documentation

## 2021-07-19 DIAGNOSIS — R918 Other nonspecific abnormal finding of lung field: Secondary | ICD-10-CM

## 2021-07-19 LAB — CBC WITH DIFFERENTIAL (CANCER CENTER ONLY)
Abs Immature Granulocytes: 0.02 10*3/uL (ref 0.00–0.07)
Basophils Absolute: 0.1 10*3/uL (ref 0.0–0.1)
Basophils Relative: 1 %
Eosinophils Absolute: 0.2 10*3/uL (ref 0.0–0.5)
Eosinophils Relative: 3 %
HCT: 39.8 % (ref 39.0–52.0)
Hemoglobin: 12.5 g/dL — ABNORMAL LOW (ref 13.0–17.0)
Immature Granulocytes: 0 %
Lymphocytes Relative: 26 %
Lymphs Abs: 1.4 10*3/uL (ref 0.7–4.0)
MCH: 24 pg — ABNORMAL LOW (ref 26.0–34.0)
MCHC: 31.4 g/dL (ref 30.0–36.0)
MCV: 76.5 fL — ABNORMAL LOW (ref 80.0–100.0)
Monocytes Absolute: 0.8 10*3/uL (ref 0.1–1.0)
Monocytes Relative: 15 %
Neutro Abs: 3.1 10*3/uL (ref 1.7–7.7)
Neutrophils Relative %: 55 %
Platelet Count: 308 10*3/uL (ref 150–400)
RBC: 5.2 MIL/uL (ref 4.22–5.81)
RDW: 22.2 % — ABNORMAL HIGH (ref 11.5–15.5)
WBC Count: 5.5 10*3/uL (ref 4.0–10.5)
nRBC: 0 % (ref 0.0–0.2)

## 2021-07-19 LAB — CMP (CANCER CENTER ONLY)
ALT: 12 U/L (ref 0–44)
AST: 12 U/L — ABNORMAL LOW (ref 15–41)
Albumin: 4.2 g/dL (ref 3.5–5.0)
Alkaline Phosphatase: 79 U/L (ref 38–126)
Anion gap: 9 (ref 5–15)
BUN: 11 mg/dL (ref 6–20)
CO2: 21 mmol/L — ABNORMAL LOW (ref 22–32)
Calcium: 13.9 mg/dL (ref 8.9–10.3)
Chloride: 104 mmol/L (ref 98–111)
Creatinine: 0.65 mg/dL (ref 0.61–1.24)
GFR, Estimated: >60 mL/min (ref >60)
Glucose, Bld: 90 mg/dL (ref 70–99)
Potassium: 4 mmol/L (ref 3.5–5.1)
Sodium: 134 mmol/L — ABNORMAL LOW (ref 135–145)
Total Bilirubin: 1 mg/dL (ref 0.3–1.2)
Total Protein: 8.3 g/dL — ABNORMAL HIGH (ref 6.5–8.1)

## 2021-07-19 MED ORDER — CABOMETYX 20 MG PO TABS
20.0000 mg | ORAL_TABLET | Freq: Every day | ORAL | 1 refills | Status: DC
  Filled 2021-07-19 – 2021-07-25 (×2): qty 30, 30d supply, fill #0

## 2021-07-19 NOTE — Progress Notes (Signed)
Hematology and Oncology Follow Up Visit  Dylan Good [id number] [birth year] 51 y.o. 07/19/2021 3:05 PM Pcp, Hoyle Sauer, MD   Principle Diagnosis: 51 year old man with kidney cancer diagnosed in June 2022.  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   Prior Therapy:   He is status post thoracentesis completed in June 2022 and repeated on January 02, 2021.  He is s/p Pleurx catheter insertion in August 2022.  He is status post stereotactic radiosurgery to the brain completed on January 29, 2021.  He received 20 Gray in 1 fraction.  He is status post kidney biopsy completed on March 06, 2021.  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  Ipilimumab 1 mg/kg with nivolumab 3 mg/kg started on January 15, 2021.  He completed 4 cycles of therapy.  Nivolumab 480 mg every 4 weeks to start on April 10, 2021.  Therapy discontinued due to progression of disease.  Current therapy: Cabozantinib 40 mg started on May 11, 2021.  Therapy interrupted during his hospitalization in December 2022.  Interim History: [name] returns today for repeat evaluation.  Since last visit, he was hospitalized on 2 separate occasions with pancreatitis.  During his hospitalization he underwent imaging studies which did not show any malignancy or obstruction in the pancreas.  CT scan on June 19, 2021 showed a favorable response to therapy with reduction of his pulmonary nodules.  Repeat CT scan on December 20 did not show any dramatic changes.  Clinically, he reports no significant improvement in his symptoms.  He is able to eat better but still has postprandial pain.  He denies any vomiting but does report occasional nausea.   Medications: Updated on review.     Allergies: No Known Allergies    Physical Exam:  Blood pressure (!) 144/83, pulse (!) 118, temperature 97.9 F (36.6 C), temperature source Temporal, resp. rate 17, height 5\' 9"   (1.753 m), weight 164 lb 11.2 oz (74.7 kg), SpO2 100 %.    ECOG: 1    General appearance: Alert, awake without any distress. Head: Atraumatic without abnormalities Oropharynx: Without any thrush or ulcers. Eyes: No scleral icterus. Lymph nodes: No lymphadenopathy noted in the cervical, supraclavicular, or axillary nodes Heart:regular rate and rhythm, without any murmurs or gallops.   Lung: Clear to auscultation without any rhonchi, wheezes or dullness to percussion. Abdomin: Soft, nontender without any shifting dullness or ascites. Musculoskeletal: No clubbing or cyanosis. Neurological: No motor or sensory deficits. Skin: No rashes or lesions.       Lab Results: Lab Results  Component Value Date   WBC 4.3 07/06/2021   HGB 12.0 (L) 07/06/2021   HCT 37.1 (L) 07/06/2021   MCV 73.8 (L) 07/06/2021   PLT 285 07/06/2021     Chemistry      Component Value Date/Time   NA 136 07/05/2021 1033   K 3.7 07/05/2021 1033   CL 105 07/05/2021 1033   CO2 26 07/05/2021 1033   BUN <5 (L) 07/05/2021 1033   CREATININE 0.57 (L) 07/05/2021 1033   CREATININE 0.63 06/05/2021 1410      Component Value Date/Time   CALCIUM 9.2 07/05/2021 1033   ALKPHOS 61 07/03/2021 0422   AST 11 (L) 07/03/2021 0422   AST 25 06/05/2021 1410   ALT 8 07/03/2021 0422   ALT 40 06/05/2021 1410   BILITOT 1.2 07/03/2021 0422   BILITOT 0.4 06/05/2021 1410      IM Study Result  Narrative & Impression  CLINICAL DATA:  Abdominal pain, acute, nonlocalized. History of metastatic renal cell carcinoma on chemotherapy   EXAM: CT ABDOMEN AND PELVIS WITH CONTRAST   TECHNIQUE: Multidetector CT imaging of the abdomen and pelvis was performed using the standard protocol following bolus administration of intravenous contrast.   CONTRAST:  29mL OMNIPAQUE IOHEXOL 350 MG/ML SOLN   COMPARISON:  CT 04/16/2021   FINDINGS: Lower chest: Multiple bilateral pulmonary nodules within the included lung bases, overall  decreased in size and number compared to the previous CT of 04/16/2021. For reference, medial left lower lobe nodule measures 2.0 x 1.3 cm (series 3, image 42) previously 3.7 x 3.6 cm). Nodule within the medial right lung base inferiorly measures 0.8 cm (series 3, image 29), previously 1.4 cm. Trace left-sided pleural effusion, slightly decreased. Previously seen pleural based masses along the left lower lobe have also significantly decreased in size from prior. Partially visualized expansile mass of the lateral aspect of the left chest wall with destructive changes of the left fifth rib, incompletely characterized at the edge of the field of view. Heart size is normal.   Hepatobiliary: No focal liver abnormality is seen. No gallstones, gallbladder wall thickening, or biliary dilatation.   Pancreas: Unremarkable. No pancreatic ductal dilatation or surrounding inflammatory changes.   Spleen: Normal in size without focal abnormality.   Adrenals/Urinary Tract: Left adrenal mass measures approximately 5.2 x 2.1 cm (previously 6.4 x 2.9 cm). Right adrenal mass measures 3.2 x 1.6 cm (previously 3.5 x 2.1 cm).   Large heterogeneous centrally necrotic mass with multiple calcifications arising from the right kidney measuring approximately 13.0 x 10.4 cm trans axially (previously 15 x 13.5 cm). Mild right hydronephrosis.   Solid left renal mass measures 2.3 x 2.2 cm trans axially (previously 2.9 x 2.9 cm). No left-sided hydronephrosis.   Urinary bladder is within normal limits.   Stomach/Bowel: Stomach is within normal limits. Appendix is surgically absent. No evidence of bowel wall thickening, distention, or inflammatory changes.   Vascular/Lymphatic: Interval decrease in size and number of retroperitoneal lymph nodes. Reference nodes includes 1.0 cm left para-aortic node (series 2, image 46). Scattered atherosclerotic calcifications throughout the aortoiliac axis without aneurysm.    Reproductive: Prostate is unremarkable.   Other: No ascites.  No abscess.  No free air.   Musculoskeletal: Destructive lesion centered at the left fifth rib, as above. Chronic changes of bilateral sacroiliitis. No new suspicious bone lesion.   IMPRESSION: 1. Findings compatible with favorable response to treatment with interval decrease in size of bilateral renal masses, bilateral adrenal gland masses, and decreasing retroperitoneal adenopathy. There is also a significant interval decrease in size and number of bilateral pulmonary nodules and left sided pleural based masses within the visualized lung bases. 2. Partially visualized expansile mass of the lateral aspect of the left chest wall with destructive changes of the left fifth rib, incompletely characterized at the edge of the field of view. 3. Mild right hydronephrosis. 4. Aortic atherosclerosis (ICD10-I70.0).   Impression and Plan:   51 year old man with:  1.  Kidney cancer diagnosed in 2022.  He was found to have stage IV clear-cell with adrenal and pulmonary involvement.  His disease status was discussed at this time and treatment choices were reviewed.  Risks and benefits of restarting cabozantinib at a lower dose versus switching to a different agent were reviewed at this time.  After discussion today, I opted to resume cabozantinib at 20 mg daily after a period of rest.  We will start the week of July 30, 2021.  We will discontinue nivolumab at this time.    2.  Pleural effusion: Recent imaging studies did not show any reaccumulation.   3.  Hypercalcemia: His calcium on July 05, 2021 was within normal range however it increased up to 13.9 today.  We will resume Zometa at this time which would help his GI symptoms.  His hypercalcemia could be contributing to his nausea and abdominal discomfort.  4.  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  Pancreatitis: Unclear etiology could be related  to cabozantinib.  Symptoms have resolved at this time.  I recommended follow-up with gastroenterology regarding his symptoms.  6.  Goals of care and prognosis: Therapy remains palliative although aggressive measures are warranted at this time.   7.  Follow-up: In 6 weeks for repeat evaluation.   30  minutes were dedicated to this visit.  Time spent on reviewing laboratory data, disease status update and outlining future plan of care. Zola Button, MD 1/12/20233:05 PM

## 2021-07-19 NOTE — Progress Notes (Signed)
CRITICAL VALUE STICKER  CRITICAL VALUE: Ca 13.9  RECEIVER (on-site recipient of call): Velna Ochs RN  DATE & TIME NOTIFIED: 07/19/21 @ 1545  MESSENGER (representative from lab): Lauren  MD NOTIFIED: Dr Alen Blew  TIME OF NOTIFICATION: 1550  RESPONSE:  Patient to receive zometa infusion 07/20/21

## 2021-07-19 NOTE — Telephone Encounter (Signed)
PC to patient, informed him he has elevated calcium level & that [name] recommends he receive zometa tomorrow.  Appointment in [facility] 07/20/21 at 3:00 p.m.  Patient verbalizes understanding.

## 2021-07-20 ENCOUNTER — Telehealth: Payer: Self-pay

## 2021-07-20 ENCOUNTER — Inpatient Hospital Stay: Payer: Medicaid Other

## 2021-07-20 ENCOUNTER — Other Ambulatory Visit (HOSPITAL_COMMUNITY): Payer: Self-pay

## 2021-07-20 MED ORDER — ZOLEDRONIC ACID 4 MG/100ML IV SOLN
4.0000 mg | Freq: Once | INTRAVENOUS | Status: AC
  Administered 2021-07-20: 4 mg via INTRAVENOUS
  Filled 2021-07-20: qty 100

## 2021-07-20 MED ORDER — SODIUM CHLORIDE 0.9 % IV SOLN: Freq: Once | INTRAVENOUS | Status: AC

## 2021-07-20 NOTE — Patient Instructions (Signed)

## 2021-07-20 NOTE — Telephone Encounter (Signed)
Oral Oncology Patient Advocate Encounter  After completing a benefits investigation, prior authorization for Cabometyx is not required at this time through [facility].  Patient's copay is $4 (after cost exceeds max override from pharmacy.    [place] [facility] Phone [phone] Fax [phone] 07/20/2021 7:46 AM

## 2021-07-25 ENCOUNTER — Other Ambulatory Visit (HOSPITAL_COMMUNITY): Payer: Self-pay

## 2021-07-25 NOTE — Telephone Encounter (Signed)
Encounter opened in error. Patient restarted on cabometyx with dose reduction.   Drema Halon, PharmD Hematology/Oncology Clinical Pharmacist [facility] [phone]

## 2021-08-03 ENCOUNTER — Telehealth: Payer: Self-pay | Admitting: Oncology

## 2021-08-03 NOTE — Telephone Encounter (Signed)
Rescheduled 03/07 to 03/10 due to providers schedule, patient has been called and notified.

## 2021-08-10 ENCOUNTER — Telehealth: Payer: Self-pay | Admitting: Oncology

## 2021-08-10 NOTE — Telephone Encounter (Signed)
Scheduled per 01/12 los, patient has been called and notified.

## 2021-08-12 ENCOUNTER — Other Ambulatory Visit: Payer: Self-pay

## 2021-08-12 ENCOUNTER — Encounter (HOSPITAL_COMMUNITY): Payer: Self-pay

## 2021-08-12 ENCOUNTER — Inpatient Hospital Stay (HOSPITAL_COMMUNITY)
Admission: EM | Admit: 2021-08-12 | Discharge: 2021-08-14 | DRG: 439 | Disposition: A | Discharge status: Home / Self Care | Payer: Medicaid Other | Attending: Internal Medicine | Admitting: Internal Medicine

## 2021-08-12 DIAGNOSIS — Z720 Tobacco use: Secondary | ICD-10-CM | POA: Diagnosis present

## 2021-08-12 DIAGNOSIS — C7800 Secondary malignant neoplasm of unspecified lung: Secondary | ICD-10-CM | POA: Diagnosis present

## 2021-08-12 DIAGNOSIS — K859 Acute pancreatitis without necrosis or infection, unspecified: Secondary | ICD-10-CM | POA: Diagnosis not present

## 2021-08-12 DIAGNOSIS — C642 Malignant neoplasm of left kidney, except renal pelvis: Secondary | ICD-10-CM | POA: Diagnosis present

## 2021-08-12 DIAGNOSIS — F17210 Nicotine dependence, cigarettes, uncomplicated: Secondary | ICD-10-CM | POA: Diagnosis present

## 2021-08-12 DIAGNOSIS — Z79899 Other long term (current) drug therapy: Secondary | ICD-10-CM

## 2021-08-12 DIAGNOSIS — Z20822 Contact with and (suspected) exposure to covid-19: Secondary | ICD-10-CM | POA: Diagnosis present

## 2021-08-12 DIAGNOSIS — I10 Essential (primary) hypertension: Secondary | ICD-10-CM | POA: Diagnosis present

## 2021-08-12 LAB — COMPREHENSIVE METABOLIC PANEL
ALT: 17 U/L (ref 0–44)
AST: 20 U/L (ref 15–41)
Albumin: 4 g/dL (ref 3.5–5.0)
Alkaline Phosphatase: 83 U/L (ref 38–126)
Anion gap: 10 (ref 5–15)
BUN: 11 mg/dL (ref 6–20)
CO2: 21 mmol/L — ABNORMAL LOW (ref 22–32)
Calcium: 9.5 mg/dL (ref 8.9–10.3)
Chloride: 101 mmol/L (ref 98–111)
Creatinine, Ser: 0.63 mg/dL (ref 0.61–1.24)
GFR, Estimated: >60 mL/min (ref >60)
Glucose, Bld: 124 mg/dL — ABNORMAL HIGH (ref 70–99)
Potassium: 3.5 mmol/L (ref 3.5–5.1)
Sodium: 132 mmol/L — ABNORMAL LOW (ref 135–145)
Total Bilirubin: 0.4 mg/dL (ref 0.3–1.2)
Total Protein: 8.4 g/dL — ABNORMAL HIGH (ref 6.5–8.1)

## 2021-08-12 LAB — CBC
HCT: 47.8 % (ref 39.0–52.0)
Hemoglobin: 14.8 g/dL (ref 13.0–17.0)
MCH: 24.2 pg — ABNORMAL LOW (ref 26.0–34.0)
MCHC: 31 g/dL (ref 30.0–36.0)
MCV: 78.1 fL — ABNORMAL LOW (ref 80.0–100.0)
Platelets: 285 10*3/uL (ref 150–400)
RBC: 6.12 MIL/uL — ABNORMAL HIGH (ref 4.22–5.81)
RDW: 19 % — ABNORMAL HIGH (ref 11.5–15.5)
WBC: 4.2 10*3/uL (ref 4.0–10.5)
nRBC: 0 % (ref 0.0–0.2)

## 2021-08-12 LAB — URINALYSIS, ROUTINE W REFLEX MICROSCOPIC
Bilirubin Urine: NEGATIVE
Glucose, UA: NEGATIVE mg/dL
Ketones, ur: NEGATIVE mg/dL
Nitrite: NEGATIVE
Protein, ur: NEGATIVE mg/dL
RBC / HPF: >50 RBC/hpf — ABNORMAL HIGH (ref 0–5)
Specific Gravity, Urine: 1.01 (ref 1.005–1.030)
pH: 6 (ref 5.0–8.0)

## 2021-08-12 LAB — RESP PANEL BY RT-PCR (FLU A&B, COVID) ARPGX2
Influenza A by PCR: NEGATIVE
Influenza B by PCR: NEGATIVE
SARS Coronavirus 2 by RT PCR: NEGATIVE

## 2021-08-12 LAB — LACTATE DEHYDROGENASE: LDH: 134 U/L (ref 98–192)

## 2021-08-12 LAB — LIPASE, BLOOD: Lipase: 417 U/L — ABNORMAL HIGH (ref 11–51)

## 2021-08-12 MED ORDER — MORPHINE SULFATE (PF) 4 MG/ML IV SOLN
4.0000 mg | INTRAVENOUS | Status: DC | PRN
  Administered 2021-08-12 – 2021-08-13 (×4): 4 mg via INTRAVENOUS
  Filled 2021-08-12 (×4): qty 1

## 2021-08-12 MED ORDER — FENTANYL CITRATE PF 50 MCG/ML IJ SOSY
50.0000 ug | PREFILLED_SYRINGE | Freq: Once | INTRAMUSCULAR | Status: AC
  Administered 2021-08-12: 50 ug via INTRAVENOUS
  Filled 2021-08-12: qty 1

## 2021-08-12 MED ORDER — ONDANSETRON HCL 4 MG/2ML IJ SOLN
4.0000 mg | Freq: Once | INTRAMUSCULAR | Status: AC
  Administered 2021-08-12: 4 mg via INTRAVENOUS
  Filled 2021-08-12: qty 2

## 2021-08-12 MED ORDER — HYDRALAZINE HCL 20 MG/ML IJ SOLN: 10.0000 mg | Freq: Three times a day (TID) | INTRAMUSCULAR | Status: DC | PRN

## 2021-08-12 MED ORDER — ENOXAPARIN SODIUM 40 MG/0.4ML IJ SOSY
40.0000 mg | PREFILLED_SYRINGE | Freq: Every day | INTRAMUSCULAR | Status: DC
  Administered 2021-08-12 – 2021-08-14 (×3): 40 mg via SUBCUTANEOUS
  Filled 2021-08-12 (×3): qty 0.4

## 2021-08-12 MED ORDER — NICOTINE 14 MG/24HR TD PT24
14.0000 mg | MEDICATED_PATCH | Freq: Every day | TRANSDERMAL | Status: DC
Start: 2021-08-12 — End: 2021-08-14
  Administered 2021-08-12 – 2021-08-14 (×3): 14 mg via TRANSDERMAL
  Filled 2021-08-12 (×3): qty 1

## 2021-08-12 MED ORDER — OXYCODONE HCL 5 MG PO TABS
5.0000 mg | ORAL_TABLET | ORAL | Status: DC | PRN
  Administered 2021-08-13 (×2): 5 mg via ORAL
  Filled 2021-08-12 (×3): qty 1

## 2021-08-12 MED ORDER — SODIUM CHLORIDE 0.9 % IV SOLN: INTRAVENOUS | Status: DC

## 2021-08-12 MED ORDER — SODIUM CHLORIDE 0.9 % IV BOLUS
500.0000 mL | Freq: Once | INTRAVENOUS | Status: AC
  Administered 2021-08-12: 500 mL via INTRAVENOUS

## 2021-08-12 MED ORDER — PROCHLORPERAZINE EDISYLATE 10 MG/2ML IJ SOLN: 10.0000 mg | Freq: Four times a day (QID) | INTRAMUSCULAR | Status: DC | PRN

## 2021-08-12 NOTE — ED Provider Notes (Signed)
[facility] DEPT Provider Note   CSN: [id number] Arrival date & time: 08/12/21  2248     History  Chief Complaint  Patient presents with   Abdominal Pain    Dylan Good is a 51 y.o. male.  Patient is a 52 year old male who presents with abdominal pain.  Per chart review, he has a history of kidney cancer that was diagnosed in June 2022.  He had some pulmonary and CNS involvement.  He underwent IV chemotherapy which was not effective and was switched to oral chemotherapy he is also undergone radiation treatments.  He has had 2 prior episodes of pancreatitis which was felt to be possibly drug-induced.  He does not have any history of EtOH use.  He does not have any known gallstones.  He said he has had a 2 to 3-day history of pain in his upper abdomen.  He had some nausea but no vomiting.  No fevers.  No change in stools.  No urinary symptoms.  He says it feels similar to his prior episodes of pancreatitis.  It is worse after eating.      Home Medications Prior to Admission medications   Medication Sig Start Date End Date Taking? Authorizing Provider  amLODipine (NORVASC) 5 MG tablet TAKE 1 TABLET (5 MG TOTAL) BY MOUTH DAILY. 07/17/21   [name], MD  cabozantinib (CABOMETYX) 20 MG tablet Take 1 tablet (20 mg total) by mouth daily. Take on an empty stomach, 1 hour before or 2 hours after meals. 07/19/21   [name], MD  diclofenac Sodium (VOLTAREN) 1 % GEL Apply 2 g topically 4 (four) times daily. Patient taking differently: Apply 2 g topically daily as needed (for joint pain). 06/21/21   Raiford Noble Latif, DO  docusate sodium (COLACE) 100 MG capsule Take 1 capsule (100 mg total) by mouth 2 (two) times daily. Patient taking differently: Take 100 mg by mouth daily as needed. 06/21/21   Sheikh, Omair Latif, DO  ondansetron (ZOFRAN) 4 MG tablet Take 1 tablet (4 mg total) by mouth every 6 (six) hours as needed for nausea. 07/06/21   Georgette Shell, MD  oxyCODONE-acetaminophen (PERCOCET/ROXICET) 5-325 MG tablet Take 1 tablet by mouth every 4 (four) hours as needed for moderate pain or severe pain. 07/06/21   Georgette Shell, MD  pantoprazole (PROTONIX) 40 MG tablet Take 1 tablet (40 mg total) by mouth daily. Patient taking differently: Take 40 mg by mouth daily as needed (for acid reflux). 06/21/21   Sheikh, Georgina Quint Latif, DO  polyethylene glycol (MIRALAX / GLYCOLAX) 17 g packet Take 17 g by mouth 2 (two) times daily. 07/06/21   Georgette Shell, MD      Allergies    Patient has no known allergies.    Review of Systems   Review of Systems  Constitutional:  Negative for chills, diaphoresis, fatigue and fever.  HENT:  Negative for congestion, rhinorrhea and sneezing.   Eyes: Negative.   Respiratory:  Negative for cough, chest tightness and shortness of breath.   Cardiovascular:  Negative for chest pain and leg swelling.  Gastrointestinal:  Positive for abdominal pain and nausea. Negative for blood in stool, diarrhea and vomiting.  Genitourinary:  Negative for difficulty urinating, flank pain, frequency and hematuria.  Musculoskeletal:  Negative for arthralgias and back pain.  Skin:  Negative for rash.  Neurological:  Negative for dizziness, speech difficulty, weakness, numbness and headaches.   Physical Exam Updated Vital Signs BP Marland Kitchen)  136/94    Pulse 70    Temp 97.7 F (36.5 C) (Oral)    Resp 14    Ht 5\' 9"  (1.753 m)    Wt 73 kg    SpO2 100%    BMI 23.78 kg/m  Physical Exam Constitutional:      Appearance: He is well-developed.  HENT:     Head: Normocephalic and atraumatic.  Eyes:     Pupils: Pupils are equal, round, and reactive to light.  Cardiovascular:     Rate and Rhythm: Normal rate and regular rhythm.     Heart sounds: Normal heart sounds.  Pulmonary:     Effort: Pulmonary effort is normal. No respiratory distress.     Breath sounds: Normal breath sounds. No wheezing or rales.  Chest:     Chest  wall: No tenderness.  Abdominal:     General: Bowel sounds are normal.     Palpations: Abdomen is soft.     Tenderness: There is abdominal tenderness in the epigastric area. There is no guarding or rebound.  Musculoskeletal:        General: Normal range of motion.     Cervical back: Normal range of motion and neck supple.  Lymphadenopathy:     Cervical: No cervical adenopathy.  Skin:    General: Skin is warm and dry.     Findings: No rash.  Neurological:     Mental Status: He is alert and oriented to person, place, and time.    ED Results / Procedures / Treatments   Labs (all labs ordered are listed, but only abnormal results are displayed) Labs Reviewed  LIPASE, BLOOD - Abnormal; Notable for the following components:      Result Value   Lipase 417 (*)    All other components within normal limits  COMPREHENSIVE METABOLIC PANEL - Abnormal; Notable for the following components:   Sodium 132 (*)    CO2 21 (*)    Glucose, Bld 124 (*)    Total Protein 8.4 (*)    All other components within normal limits  CBC - Abnormal; Notable for the following components:   RBC 6.12 (*)    MCV 78.1 (*)    MCH 24.2 (*)    RDW 19.0 (*)    All other components within normal limits  RESP PANEL BY RT-PCR (FLU A&B, COVID) ARPGX2  URINALYSIS, ROUTINE W REFLEX MICROSCOPIC    EKG None  Radiology No results found.  Procedures Procedures    Medications Ordered in ED Medications  fentaNYL (SUBLIMAZE) injection 50 mcg (has no administration in time range)  fentaNYL (SUBLIMAZE) injection 50 mcg (50 mcg Intravenous Given 08/12/21 1003)  sodium chloride 0.9 % bolus 500 mL (0 mLs Intravenous Stopped 08/12/21 1048)  ondansetron (ZOFRAN) injection 4 mg (4 mg Intravenous Given 08/12/21 1003)    ED Course/ Medical Decision Making/ A&P                           Medical Decision Making Amount and/or Complexity of Data Reviewed Labs: ordered.  Risk Prescription drug management. Decision regarding  hospitalization.   Patient is a 51 year old male who presents with epigastric pain consistent with his prior episodes of pancreatitis.  He has had 2 recent imaging studies in December.  Given his similarity of symptoms, I do not feel that repeat imaging was indicated today.  It does appear that he has acute pancreatitis.  His lipase is elevated around 417.  His other blood work is nonconcerning.  He was given IV fluids as well as opioid medications and antiemetics.  He is feeling somewhat better after this but still has pain.  I discussed with him inpatient versus outpatient therapy and he is concerned about going home given his discomfort.  I feel hospitalization is appropriate at this point.  I consulted Dr. Marylyn Ishihara who will admit the patient for further treatment.  Patient's chart was reviewed for further history.  Final Clinical Impression(s) / ED Diagnoses Final diagnoses:  Acute pancreatitis, unspecified complication status, unspecified pancreatitis type    Rx / DC Orders ED Discharge Orders     None         Malvin Johns, MD 08/12/21 1236

## 2021-08-12 NOTE — H&P (Signed)
History and Physical    Patient: Dylan Good DOB: 1970/12/15 DOA: 08/12/2021 DOS: the patient was seen and examined on 08/12/2021 PCP: Pcp, No  Patient coming from: Home  Chief Complaint:  Chief Complaint  Patient presents with   Abdominal Pain    HPI: Dylan Good is a 51 y.o. 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 Presenting with abdominal pain. It started 3 days ago and is mostly periumbilical and epigastric. It's crampy and sharp. It comes in waves of 15 - 30 minute episodes. It is worse at night. He didn't try any medicines for help. He did have some nausea and vomiting, but no fevers. He reports that in the past his pancreatitis has been thought to be d/t one of his chemo medications. He temporarily stopped that medication. However, it was recently resumed. His symptoms became unbearable today, so he came to the ED for help. He denies any other aggravating or alleviating factors.   Review of Systems: As mentioned in the history of present illness. All other systems reviewed and are negative. Past Medical History:  Diagnosis Date   Cancer, metastatic to lung [facility])    Hypertension 07/01/2021   Renal cell adenocarcinoma, left [facility])    Past Surgical History:  Procedure Laterality Date   APPENDECTOMY     BIOPSY  07/03/2021   Procedure: BIOPSY;  Surgeon: Ronnette Juniper, MD;  Location: [facility];  Service: Gastroenterology;;   CHEST TUBE INSERTION Left 02/06/2021   Procedure: INSERTION PLEURAL DRAINAGE CATHETER;  Surgeon: Garner Nash, DO;  Location: [facility];  Service: Pulmonary;  Laterality: Left;   CHEST TUBE INSERTION N/A 04/04/2021   Procedure: removal of pleurex cath;  Surgeon: Garner Nash, DO;  Location: [place];  Service: Pulmonary;  Laterality: N/A;   ESOPHAGOGASTRODUODENOSCOPY (EGD) WITH PROPOFOL N/A 07/03/2021   Procedure: ESOPHAGOGASTRODUODENOSCOPY (EGD) WITH PROPOFOL;   Surgeon: Ronnette Juniper, MD;  Location: [facility];  Service: Gastroenterology;  Laterality: N/A;   Social History:  reports that he has been smoking cigarettes. He has never used smokeless tobacco. He reports that he does not drink alcohol and does not use drugs.  No Known Allergies  Family History  Problem Relation Age of Onset   Lupus Mother    Cancer Other     Prior to Admission medications   Medication Sig Start Date End Date Taking? Authorizing Provider  amLODipine (NORVASC) 5 MG tablet TAKE 1 TABLET (5 MG TOTAL) BY MOUTH DAILY. 07/17/21   [name], MD  cabozantinib (CABOMETYX) 20 MG tablet Take 1 tablet (20 mg total) by mouth daily. Take on an empty stomach, 1 hour before or 2 hours after meals. 07/19/21   [name], MD  diclofenac Sodium (VOLTAREN) 1 % GEL Apply 2 g topically 4 (four) times daily. Patient taking differently: Apply 2 g topically daily as needed (for joint pain). 06/21/21   Raiford Noble Latif, DO  docusate sodium (COLACE) 100 MG capsule Take 1 capsule (100 mg total) by mouth 2 (two) times daily. Patient taking differently: Take 100 mg by mouth daily as needed. 06/21/21   Sheikh, Omair Latif, DO  ondansetron (ZOFRAN) 4 MG tablet Take 1 tablet (4 mg total) by mouth every 6 (six) hours as needed for nausea. 07/06/21   Georgette Shell, MD  oxyCODONE-acetaminophen (PERCOCET/ROXICET) 5-325 MG tablet Take 1 tablet by mouth every 4 (four) hours as needed for moderate pain or  severe pain. 07/06/21   Georgette Shell, MD  pantoprazole (PROTONIX) 40 MG tablet Take 1 tablet (40 mg total) by mouth daily. Patient taking differently: Take 40 mg by mouth daily as needed (for acid reflux). 06/21/21   Sheikh, Georgina Quint Latif, DO  polyethylene glycol (MIRALAX / GLYCOLAX) 17 g packet Take 17 g by mouth 2 (two) times daily. 07/06/21   Georgette Shell, MD    Physical Exam: Vitals:   08/12/21 0930 08/12/21 1005 08/12/21 1100 08/12/21 1200  BP: 128/86 131/84 121/84  (!) 136/94  Pulse: 77 76 71 70  Resp: 12 14 14 14   Temp:      TempSrc:      SpO2: 100% 100% 100% 100%  Weight:      Height:       General: 51 y.o. male resting in bed in NAD Eyes: PERRL, normal sclera ENMT: Nares patent w/o discharge, orophaynx clear, dentition normal, ears w/o discharge/lesions/ulcers Neck: Supple, trachea midline Cardiovascular: RRR, +S1, S2, no m/g/r, equal pulses throughout Respiratory: CTABL, no w/r/r, normal WOB GI: BS+, ND, epigastric TTP, no masses noted, no organomegaly noted MSK: No e/c/c Neuro: A&O x 3, no focal deficits Psyc: Appropriate interaction and affect, calm/cooperative   Data Reviewed:  Lipase  417 Bicarb 21   Assessment and Plan: No notes have been filed under this hospital service. Service: Hospitalist Acute pancreatitis     - place in obs, med-surg     - fluids, pain control, anti-emetics     - NPO except ice chips, sips     - no fevers, WBC normal; no imaging at presentation, right now reasonable to continue with above treatment, if not improving over next 24 hours, will need abdominal imaging  HTN     - resume home regimen  Tobacco abuse     - counsel against further use     - nicotine patch available  RCC of left kidney w/ mets to lung     - continue outpt follow up with onco  Advance Care Planning: FULL  Consults: None  Family Communication: None at bedside  Severity of Illness: The appropriate patient status for this patient is OBSERVATION. Observation status is judged to be reasonable and necessary in order to provide the required intensity of service to ensure the patient's safety. The patient's presenting symptoms, physical exam findings, and initial radiographic and laboratory data in the context of their medical condition is felt to place them at decreased risk for further clinical deterioration. Furthermore, it is anticipated that the patient will be medically stable for discharge from the hospital within 2  midnights of admission.   Author: Jonnie Finner, DO 08/12/2021 12:51 PM  For on call review [email or web address].

## 2021-08-12 NOTE — ED Notes (Signed)
Pt attempted to give urine sample, unable to at this time Pt aware that staff would like him to give one when he feels he can.   Blood samples drawn, pt placed on monitoring and given warm blanket.

## 2021-08-12 NOTE — ED Notes (Signed)
Pt up to bathroom again, unable to provide urine sample

## 2021-08-12 NOTE — ED Triage Notes (Signed)
Patient c/o upper and mid abdominal pain x 2 days. Patient states he vomited x 1 only.

## 2021-08-13 DIAGNOSIS — C7800 Secondary malignant neoplasm of unspecified lung: Secondary | ICD-10-CM | POA: Diagnosis present

## 2021-08-13 DIAGNOSIS — Z20822 Contact with and (suspected) exposure to covid-19: Secondary | ICD-10-CM | POA: Diagnosis present

## 2021-08-13 DIAGNOSIS — I10 Essential (primary) hypertension: Secondary | ICD-10-CM

## 2021-08-13 DIAGNOSIS — C642 Malignant neoplasm of left kidney, except renal pelvis: Secondary | ICD-10-CM | POA: Diagnosis present

## 2021-08-13 DIAGNOSIS — Z720 Tobacco use: Secondary | ICD-10-CM

## 2021-08-13 DIAGNOSIS — K859 Acute pancreatitis without necrosis or infection, unspecified: Secondary | ICD-10-CM | POA: Diagnosis present

## 2021-08-13 DIAGNOSIS — F17210 Nicotine dependence, cigarettes, uncomplicated: Secondary | ICD-10-CM | POA: Diagnosis present

## 2021-08-13 DIAGNOSIS — Z79899 Other long term (current) drug therapy: Secondary | ICD-10-CM | POA: Diagnosis not present

## 2021-08-13 LAB — COMPREHENSIVE METABOLIC PANEL
ALT: 14 U/L (ref 0–44)
AST: 15 U/L (ref 15–41)
Albumin: 3.1 g/dL — ABNORMAL LOW (ref 3.5–5.0)
Alkaline Phosphatase: 59 U/L (ref 38–126)
Anion gap: 7 (ref 5–15)
BUN: 7 mg/dL (ref 6–20)
CO2: 21 mmol/L — ABNORMAL LOW (ref 22–32)
Calcium: 8 mg/dL — ABNORMAL LOW (ref 8.9–10.3)
Chloride: 108 mmol/L (ref 98–111)
Creatinine, Ser: 0.54 mg/dL — ABNORMAL LOW (ref 0.61–1.24)
GFR, Estimated: >60 mL/min (ref >60)
Glucose, Bld: 78 mg/dL (ref 70–99)
Potassium: 3.5 mmol/L (ref 3.5–5.1)
Sodium: 136 mmol/L (ref 135–145)
Total Bilirubin: 0.4 mg/dL (ref 0.3–1.2)
Total Protein: 6.6 g/dL (ref 6.5–8.1)

## 2021-08-13 LAB — CBC
HCT: 38.4 % — ABNORMAL LOW (ref 39.0–52.0)
Hemoglobin: 12.3 g/dL — ABNORMAL LOW (ref 13.0–17.0)
MCH: 24.7 pg — ABNORMAL LOW (ref 26.0–34.0)
MCHC: 32 g/dL (ref 30.0–36.0)
MCV: 77.3 fL — ABNORMAL LOW (ref 80.0–100.0)
Platelets: 226 10*3/uL (ref 150–400)
RBC: 4.97 MIL/uL (ref 4.22–5.81)
RDW: 18.4 % — ABNORMAL HIGH (ref 11.5–15.5)
WBC: 2.8 10*3/uL — ABNORMAL LOW (ref 4.0–10.5)
nRBC: 0 % (ref 0.0–0.2)

## 2021-08-13 LAB — LIPASE, BLOOD: Lipase: 169 U/L — ABNORMAL HIGH (ref 11–51)

## 2021-08-13 LAB — MAGNESIUM: Magnesium: 1.6 mg/dL — ABNORMAL LOW (ref 1.7–2.4)

## 2021-08-13 MED ORDER — POTASSIUM CHLORIDE CRYS ER 10 MEQ PO TBCR
40.0000 meq | EXTENDED_RELEASE_TABLET | Freq: Once | ORAL | Status: AC
  Administered 2021-08-13: 40 meq via ORAL
  Filled 2021-08-13: qty 4

## 2021-08-13 MED ORDER — MAGNESIUM SULFATE 4 GM/100ML IV SOLN
4.0000 g | Freq: Once | INTRAVENOUS | Status: AC
  Administered 2021-08-13: 4 g via INTRAVENOUS
  Filled 2021-08-13: qty 100

## 2021-08-13 MED ORDER — SODIUM CHLORIDE 0.9 % IV BOLUS
1000.0000 mL | Freq: Once | INTRAVENOUS | Status: AC
  Administered 2021-08-13: 1000 mL via INTRAVENOUS

## 2021-08-13 NOTE — Progress Notes (Signed)
°  Transition of Care [facility]) Screening Note   Patient Details  Name: Dylan Good Date of Birth: 1970-08-03   Transition of Care [facility]) CM/SW Contact:    Lennart Pall, LCSW Phone Number: 08/13/2021, 11:04 AM    Transition of Care Department [facility]) has reviewed patient and no TOC needs have been identified at this time. We will continue to monitor patient advancement through interdisciplinary progression rounds. If new patient transition needs arise, please place a TOC consult.

## 2021-08-13 NOTE — Progress Notes (Signed)
PROGRESS NOTE    Dylan Good  DJT:[id number] DOB: 1970/08/26 DOA: 08/12/2021 PCP: Pcp, No    Chief Complaint  Patient presents with   Abdominal Pain    Brief Narrative:  Patient is a 51 year old gentleman history of renal cell cancer of the left kidney with mets to the lung, hypertension, tobacco abuse presented with abdominal pain in the epigastric and periumbilical region crampy and sharp.  Patient noted to have had a prior bout of pancreatitis felt to be secondary to one of his chemo medications which was temporarily stopped and has been resumed at a lower dose approximately 1 week prior to presentation.  Patient seen in the ED lipase levels noted to be elevated at 417.  Patient placed on bowel rest, aggressive fluid resuscitation, admitted for acute pancreatitis.    Assessment & Plan:   Assessment and Plan: * Acute pancreatitis- (present on admission) - Patient presenting with epigastric abdominal pain, nausea, emesis. -Lipase levels noted to be elevated at 417. -Patient with no transaminitis noted on comprehensive metabolic profile. -Patient with no prior history of gallbladder disease. -Triglyceride level 60 (07/02/21). -Patient noted to have had a prior bout of pancreatitis felt secondary to one of his chemo medications that were discontinued and recently resumed with decreased dose. -Patient improving clinically. -Continue aggressive IV fluid resuscitation. -1 L normal saline bolus. -Trial of clear liquids. -Pain management, IV antiemetics, supportive care.  Hypomagnesemia - Magnesium sulfate 4 g IV x1. -Repeat labs in the morning.  Tobacco use- (present on admission) - Tobacco cessation stressed to patient. -Continue nicotine patch.  Hypertension- (present on admission) - Controlled. -Follow-up.         DVT prophylaxis: Lovenox Code Status: Full Family Communication: Updated patient and girlfriend at bedside. Disposition:   Status is:  Inpatient Remains inpatient appropriate because: Severity of illness           Consultants:  None  Procedures:  None  Antimicrobials:  None   Subjective: Patient laying in bed.  Denies any chest pain.  No shortness of breath.  Overall feeling better than on admission.  Epigastric pain improving.  Objective: Vitals:   08/13/21 0152 08/13/21 0607 08/13/21 0819 08/13/21 1322  BP: 138/83 137/81 123/80 131/78  Pulse: 79 79 76 76  Resp: 16 16 12 14   Temp: 98.5 F (36.9 C) 98.6 F (37 C) 98.6 F (37 C) 97.8 F (36.6 C)  TempSrc: Oral Oral Oral Oral  SpO2: 100% 100% 100% 100%  Weight:      Height:        Intake/Output Summary (Last 24 hours) at 08/13/2021 1734 Last data filed at 08/13/2021 1700 Gross per 24 hour  Intake 4164.99 ml  Output 600 ml  Net 3564.99 ml   Filed Weights   08/12/21 0843 08/12/21 1427  Weight: 73 kg 72.4 kg    Examination:  General exam: Appears calm and comfortable  Respiratory system: Clear to auscultation. Respiratory effort normal. Cardiovascular system: S1 & S2 heard, RRR. No JVD, murmurs, rubs, gallops or clicks. No pedal edema. Gastrointestinal system: Abdomen is nondistended, soft and tender to palpation in the epigastric region.  Positive bowel sounds.  No rebound.  No guarding.  Central nervous system: Alert and oriented. No focal neurological deficits. Extremities: Symmetric 5 x 5 power. Skin: No rashes, lesions or ulcers Psychiatry: Judgement and insight appear normal. Mood & affect appropriate.     Data Reviewed:   CBC: Recent Labs  Lab 08/12/21 0845 08/13/21 0421  WBC 4.2  2.8*  HGB 14.8 12.3*  HCT 47.8 38.4*  MCV 78.1* 77.3*  PLT 285 268    Basic Metabolic Panel: Recent Labs  Lab 08/12/21 0845 08/13/21 0421 08/13/21 0823  NA 132* 136  --   K 3.5 3.5  --   CL 101 108  --   CO2 21* 21*  --   GLUCOSE 124* 78  --   BUN 11 7  --   CREATININE 0.63 0.54*  --   CALCIUM 9.5 8.0*  --   MG  --   --  1.6*     GFR: Estimated Creatinine Clearance: 112.9 mL/min (A) (by C-G formula based on SCr of 0.54 mg/dL (L)).  Liver Function Tests: Recent Labs  Lab 08/12/21 0845 08/13/21 0421  AST 20 15  ALT 17 14  ALKPHOS 83 59  BILITOT 0.4 0.4  PROT 8.4* 6.6  ALBUMIN 4.0 3.1*    CBG: No results for input(s): GLUCAP in the last 168 hours.   Recent Results (from the past 240 hour(s))  Resp Panel by RT-PCR (Flu A&B, Covid) Nasopharyngeal Swab     Status: None   Collection Time: 08/12/21 12:44 PM   Specimen: Nasopharyngeal Swab; Nasopharyngeal(NP) swabs in vial transport medium  Result Value Ref Range Status   SARS Coronavirus 2 by RT PCR NEGATIVE NEGATIVE Final    Comment: (NOTE) SARS-CoV-2 target nucleic acids are NOT DETECTED.  The SARS-CoV-2 RNA is generally detectable in upper respiratory specimens during the acute phase of infection. The lowest concentration of SARS-CoV-2 viral copies this assay can detect is 138 copies/mL. A negative result does not preclude SARS-Cov-2 infection and should not be used as the sole basis for treatment or other patient management decisions. A negative result may occur with  improper specimen collection/handling, submission of specimen other than nasopharyngeal swab, presence of viral mutation(s) within the areas targeted by this assay, and inadequate number of viral copies(<138 copies/mL). A negative result must be combined with clinical observations, patient history, and epidemiological information. The expected result is Negative.  Fact Sheet for Patients:  EntrepreneurPulse.com.au  Fact Sheet for Healthcare Providers:  IncredibleEmployment.be  This test is no t yet approved or cleared by the [place] FDA and  has been authorized for detection and/or diagnosis of SARS-CoV-2 by FDA under an Emergency Use Authorization (EUA). This EUA will remain  in effect (meaning this test can be used) for the duration  of the COVID-19 declaration under Section 564(b)(1) of the Act, 21 U.S.C.section 360bbb-3(b)(1), unless the authorization is terminated  or revoked sooner.       Influenza A by PCR NEGATIVE NEGATIVE Final   Influenza B by PCR NEGATIVE NEGATIVE Final    Comment: (NOTE) The Xpert Xpress SARS-CoV-2/FLU/RSV plus assay is intended as an aid in the diagnosis of influenza from Nasopharyngeal swab specimens and should not be used as a sole basis for treatment. Nasal washings and aspirates are unacceptable for Xpert Xpress SARS-CoV-2/FLU/RSV testing.  Fact Sheet for Patients: EntrepreneurPulse.com.au  Fact Sheet for Healthcare Providers: IncredibleEmployment.be  This test is not yet approved or cleared by the [place] FDA and has been authorized for detection and/or diagnosis of SARS-CoV-2 by FDA under an Emergency Use Authorization (EUA). This EUA will remain in effect (meaning this test can be used) for the duration of the COVID-19 declaration under Section 564(b)(1) of the Act, 21 U.S.C. section 360bbb-3(b)(1), unless the authorization is terminated or revoked.  Performed at [facility], [address] [place],  [place] [address]          Radiology Studies: No results found.      Scheduled Meds:  enoxaparin (LOVENOX) injection  40 mg Subcutaneous Daily   nicotine  14 mg Transdermal Daily   Continuous Infusions:  sodium chloride 150 mL/hr at 08/13/21 0351     LOS: 0 days    Time spent: 35 minutes    Irine Seal, MD [facility]   To contact the attending provider between 7A-7P or the covering provider during after hours 7P-7A, please log into the web site [email or web address] and access using universal [place] password for that web site. If you do not have the password, please call the hospital operator.  08/13/2021, 5:34 PM

## 2021-08-13 NOTE — Assessment & Plan Note (Addendum)
-   Controlled. -Outpatient follow-up.

## 2021-08-13 NOTE — Assessment & Plan Note (Addendum)
-   Magnesium sulfate 4 g IV x1 given during the hospitalization. -Outpatient follow-up with PCP.

## 2021-08-13 NOTE — Assessment & Plan Note (Addendum)
-   Tobacco cessation stressed to patient. -Patient maintained on a nicotine patch.

## 2021-08-13 NOTE — Hospital Course (Signed)
Patient is a 51 year old gentleman history of renal cell cancer of the left kidney with mets to the lung, hypertension, tobacco abuse presented with abdominal pain in the epigastric and periumbilical region crampy and sharp.  Patient noted to have had a prior bout of pancreatitis felt to be secondary to one of his chemo medications which was temporarily stopped and has been resumed at a lower dose approximately 1 week prior to presentation.  Patient seen in the ED lipase levels noted to be elevated at 417.  Patient placed on bowel rest, aggressive fluid resuscitation, admitted for acute pancreatitis.

## 2021-08-13 NOTE — Assessment & Plan Note (Addendum)
-   Patient presenting with epigastric abdominal pain, nausea, emesis. -Lipase levels noted to be elevated at 417. -Patient with no transaminitis noted on comprehensive metabolic profile. -Patient with no prior history of gallbladder disease. -Triglyceride level 60 (07/02/21). -Patient noted to have had a prior bout of pancreatitis felt secondary to one of his chemo medications that were discontinued and recently resumed with decreased dose. -Patient with extensive work-up during prior hospitalization including MRCP, upper endoscopy which was unrevealing. -Patient hydrated aggressively with IV fluids. -Patient improved clinically transition to clear liquids which she tolerated and subsequently full liquids which he tolerated. -Patient was discharged home on a full liquid diet and to start on a soft diet tomorrow 08/15/2021. -Patient instructed to hold oral chemo medicine until follow-up with his oncologist in 1 week. -Patient was discharged in stable and improved condition.

## 2021-08-14 LAB — COMPREHENSIVE METABOLIC PANEL
ALT: 12 U/L (ref 0–44)
AST: 16 U/L (ref 15–41)
Albumin: 2.9 g/dL — ABNORMAL LOW (ref 3.5–5.0)
Alkaline Phosphatase: 59 U/L (ref 38–126)
Anion gap: 4 — ABNORMAL LOW (ref 5–15)
BUN: <5 mg/dL — ABNORMAL LOW (ref 6–20)
CO2: 22 mmol/L (ref 22–32)
Calcium: 7.9 mg/dL — ABNORMAL LOW (ref 8.9–10.3)
Chloride: 112 mmol/L — ABNORMAL HIGH (ref 98–111)
Creatinine, Ser: 0.64 mg/dL (ref 0.61–1.24)
GFR, Estimated: >60 mL/min (ref >60)
Glucose, Bld: 85 mg/dL (ref 70–99)
Potassium: 4.5 mmol/L (ref 3.5–5.1)
Sodium: 138 mmol/L (ref 135–145)
Total Bilirubin: 0.6 mg/dL (ref 0.3–1.2)
Total Protein: 6.4 g/dL — ABNORMAL LOW (ref 6.5–8.1)

## 2021-08-14 LAB — PHOSPHORUS: Phosphorus: 2.1 mg/dL — ABNORMAL LOW (ref 2.5–4.6)

## 2021-08-14 LAB — CBC WITH DIFFERENTIAL/PLATELET
Abs Immature Granulocytes: 0.03 10*3/uL (ref 0.00–0.07)
Basophils Absolute: 0 10*3/uL (ref 0.0–0.1)
Basophils Relative: 1 %
Eosinophils Absolute: 0.1 10*3/uL (ref 0.0–0.5)
Eosinophils Relative: 4 %
HCT: 39.8 % (ref 39.0–52.0)
Hemoglobin: 12.7 g/dL — ABNORMAL LOW (ref 13.0–17.0)
Immature Granulocytes: 1 %
Lymphocytes Relative: 39 %
Lymphs Abs: 1.1 10*3/uL (ref 0.7–4.0)
MCH: 24.6 pg — ABNORMAL LOW (ref 26.0–34.0)
MCHC: 31.9 g/dL (ref 30.0–36.0)
MCV: 77.1 fL — ABNORMAL LOW (ref 80.0–100.0)
Monocytes Absolute: 0.3 10*3/uL (ref 0.1–1.0)
Monocytes Relative: 12 %
Neutro Abs: 1.2 10*3/uL — ABNORMAL LOW (ref 1.7–7.7)
Neutrophils Relative %: 43 %
Platelets: 217 10*3/uL (ref 150–400)
RBC: 5.16 MIL/uL (ref 4.22–5.81)
RDW: 18.3 % — ABNORMAL HIGH (ref 11.5–15.5)
WBC: 2.7 10*3/uL — ABNORMAL LOW (ref 4.0–10.5)
nRBC: 0 % (ref 0.0–0.2)

## 2021-08-14 LAB — LIPASE, BLOOD: Lipase: 177 U/L — ABNORMAL HIGH (ref 11–51)

## 2021-08-14 LAB — MAGNESIUM: Magnesium: 2.1 mg/dL (ref 1.7–2.4)

## 2021-08-14 MED ORDER — NICOTINE 14 MG/24HR TD PT24: 14.0000 mg | MEDICATED_PATCH | Freq: Every day | TRANSDERMAL | 0 refills | Status: DC

## 2021-08-14 MED ORDER — PANTOPRAZOLE SODIUM 40 MG PO TBEC: 40.0000 mg | DELAYED_RELEASE_TABLET | Freq: Every day | ORAL | 1 refills | Status: DC

## 2021-08-14 MED ORDER — ONDANSETRON HCL 4 MG PO TABS: 4.0000 mg | ORAL_TABLET | Freq: Four times a day (QID) | ORAL | 0 refills | Status: DC | PRN

## 2021-08-14 MED ORDER — POTASSIUM PHOSPHATES 15 MMOLE/5ML IV SOLN
30.0000 mmol | Freq: Once | INTRAVENOUS | Status: AC
  Administered 2021-08-14: 30 mmol via INTRAVENOUS
  Filled 2021-08-14: qty 10

## 2021-08-14 NOTE — Discharge Summary (Signed)
Physician Discharge Summary  Dylan Good XLK:[id number] DOB: 10-21-1970 DOA: 08/12/2021  PCP: Pcp, No  Admit date: 08/12/2021 Discharge date: 08/14/2021  Time spent: 55 minutes  Recommendations for Outpatient Follow-up:  Follow-up with [name], oncology in 1 week. Follow-up with PCP in 2 weeks.  On follow-up patient will need a basic metabolic profile, magnesium level done to follow-up on electrolytes and renal function.  Follow-up on pancreatitis.   Discharge Diagnoses:  Principal Problem:   Acute pancreatitis Active Problems:   Hypertension   Tobacco use   Hypomagnesemia   Discharge Condition: Stable and improved.  Diet recommendation: Full liquids for the next 24 hours and then advance as tolerated to a soft diet.  Filed Weights   08/12/21 0843 08/12/21 1427  Weight: 73 kg 72.4 kg    History of present illness:  HPI per Dr. Sedalia Good is a 51 y.o. 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 Presenting with abdominal pain. It started 3 days ago and is mostly periumbilical and epigastric. It's crampy and sharp. It comes in waves of 15 - 30 minute episodes. It is worse at night. He didn't try any medicines for help. He did have some nausea and vomiting, but no fevers. He reports that in the past his pancreatitis has been thought to be d/t one of his chemo medications. He temporarily stopped that medication. However, it was recently resumed. His symptoms became unbearable today, so he came to the ED for help. He denies any other aggravating or alleviating factors.    Hospital Course:   Assessment and Plan: * Acute pancreatitis- (present on admission) - Patient presenting with epigastric abdominal pain, nausea, emesis. -Lipase levels noted to be elevated at 417. -Patient with no transaminitis noted on comprehensive metabolic profile. -Patient with no prior history of gallbladder disease. -Triglyceride  level 60 (07/02/21). -Patient noted to have had a prior bout of pancreatitis felt secondary to one of his chemo medications that were discontinued and recently resumed with decreased dose. -Patient with extensive work-up during prior hospitalization including MRCP, upper endoscopy which was unrevealing. -Patient hydrated aggressively with IV fluids. -Patient improved clinically transition to clear liquids which she tolerated and subsequently full liquids which he tolerated. -Patient was discharged home on a full liquid diet and to start on a soft diet tomorrow 08/15/2021. -Patient instructed to hold oral chemo medicine until follow-up with his oncologist in 1 week. -Patient was discharged in stable and improved condition.  Hypomagnesemia - Magnesium sulfate 4 g IV x1 given during the hospitalization. -Outpatient follow-up with PCP.  Tobacco use- (present on admission) - Tobacco cessation stressed to patient. -Patient maintained on a nicotine patch.    Hypertension- (present on admission) - Controlled. -Outpatient follow-up.        Procedures: None  Consultations: Curb sided oncology: [name]  Discharge Exam: Vitals:   08/14/21 0521 08/14/21 1441  BP: 135/86 (!) 138/91  Pulse: 74 74  Resp: 17 18  Temp: 98.1 F (36.7 C)   SpO2: 100% 100%    General: NAD Cardiovascular: RRR no murmurs rubs or gallops.  No JVD.  No lower extremity edema. Respiratory: Clear to auscultation bilaterally.  No wheezes, no crackles, no rhonchi. GI: Abdomen is soft, nontender, nondistended, positive bowel sounds.  No rebound.  No guarding.  Discharge Instructions   Discharge Instructions     Diet full liquid   Complete by: As directed  Continue full liquid through today and if continued improvement may attempt a soft diet on 08/15/2021.   Increase activity slowly   Complete by: As directed    Increase activity slowly   Complete by: As directed       Allergies as of 08/14/2021   No  Known Allergies      Medication List     STOP taking these medications    Cabometyx 20 MG tablet Generic drug: cabozantinib       TAKE these medications    amLODipine 5 MG tablet Commonly known as: NORVASC TAKE 1 TABLET (5 MG TOTAL) BY MOUTH DAILY.   diclofenac Sodium 1 % Gel Commonly known as: VOLTAREN Apply 2 g topically 4 (four) times daily.   docusate sodium 100 MG capsule Commonly known as: COLACE Take 1 capsule (100 mg total) by mouth 2 (two) times daily.   nicotine 14 mg/24hr patch Commonly known as: NICODERM CQ - dosed in mg/24 hours Place 1 patch (14 mg total) onto the skin daily. Start taking on: August 15, 2021   ondansetron 4 MG tablet Commonly known as: ZOFRAN Take 1 tablet (4 mg total) by mouth every 6 (six) hours as needed for nausea.   oxyCODONE-acetaminophen 5-325 MG tablet Commonly known as: PERCOCET/ROXICET Take 1 tablet by mouth every 4 (four) hours as needed for moderate pain or severe pain.   pantoprazole 40 MG tablet Commonly known as: Protonix Take 1 tablet (40 mg total) by mouth daily.   polyethylene glycol 17 g packet Commonly known as: MIRALAX / GLYCOLAX Take 17 g by mouth 2 (two) times daily.       No Known Allergies  Follow-up Information     [name], MD. Schedule an appointment as soon as possible for a visit in 1 week(s).   Specialty: Oncology Why: Office will call with follow-up appointment in 1 week. Contact information: [place] [address] [phone]         PCP Follow up in 2 week(s).                   The results of significant diagnostics from this hospitalization (including imaging, microbiology, ancillary and laboratory) are listed below for reference.    Significant Diagnostic Studies: No results found.  Microbiology: Recent Results (from the past 240 hour(s))  Resp Panel by RT-PCR (Flu A&B, Covid) Nasopharyngeal Swab     Status: None   Collection Time:  08/12/21 12:44 PM   Specimen: Nasopharyngeal Swab; Nasopharyngeal(NP) swabs in vial transport medium  Result Value Ref Range Status   SARS Coronavirus 2 by RT PCR NEGATIVE NEGATIVE Final    Comment: (NOTE) SARS-CoV-2 target nucleic acids are NOT DETECTED.  The SARS-CoV-2 RNA is generally detectable in upper respiratory specimens during the acute phase of infection. The lowest concentration of SARS-CoV-2 viral copies this assay can detect is 138 copies/mL. A negative result does not preclude SARS-Cov-2 infection and should not be used as the sole basis for treatment or other patient management decisions. A negative result may occur with  improper specimen collection/handling, submission of specimen other than nasopharyngeal swab, presence of viral mutation(s) within the areas targeted by this assay, and inadequate number of viral copies(<138 copies/mL). A negative result must be combined with clinical observations, patient history, and epidemiological information. The expected result is Negative.  Fact Sheet for Patients:  EntrepreneurPulse.com.au  Fact Sheet for Healthcare Providers:  IncredibleEmployment.be  This test is no t yet approved or  cleared by the [place] and  has been authorized for detection and/or diagnosis of SARS-CoV-2 by FDA under an Emergency Use Authorization (EUA). This EUA will remain  in effect (meaning this test can be used) for the duration of the COVID-19 declaration under Section 564(b)(1) of the Act, 21 U.S.C.section 360bbb-3(b)(1), unless the authorization is terminated  or revoked sooner.       Influenza A by PCR NEGATIVE NEGATIVE Final   Influenza B by PCR NEGATIVE NEGATIVE Final    Comment: (NOTE) The Xpert Xpress SARS-CoV-2/FLU/RSV plus assay is intended as an aid in the diagnosis of influenza from Nasopharyngeal swab specimens and should not be used as a sole basis for treatment. Nasal washings  and aspirates are unacceptable for Xpert Xpress SARS-CoV-2/FLU/RSV testing.  Fact Sheet for Patients: EntrepreneurPulse.com.au  Fact Sheet for Healthcare Providers: IncredibleEmployment.be  This test is not yet approved or cleared by the [place] FDA and has been authorized for detection and/or diagnosis of SARS-CoV-2 by FDA under an Emergency Use Authorization (EUA). This EUA will remain in effect (meaning this test can be used) for the duration of the COVID-19 declaration under Section 564(b)(1) of the Act, 21 U.S.C. section 360bbb-3(b)(1), unless the authorization is terminated or revoked.  Performed at [facility], [place] [address], [place], [place] [address]      Labs: Basic Metabolic Panel: Recent Labs  Lab 08/12/21 0845 08/13/21 0421 08/13/21 0823 08/14/21 0446  NA 132* 136  --  138  K 3.5 3.5  --  4.5  CL 101 108  --  112*  CO2 21* 21*  --  22  GLUCOSE 124* 78  --  85  BUN 11 7  --  <5*  CREATININE 0.63 0.54*  --  0.64  CALCIUM 9.5 8.0*  --  7.9*  MG  --   --  1.6* 2.1  PHOS  --   --   --  2.1*   Liver Function Tests: Recent Labs  Lab 08/12/21 0845 08/13/21 0421 08/14/21 0446  AST 20 15 16   ALT 17 14 12   ALKPHOS 83 59 59  BILITOT 0.4 0.4 0.6  PROT 8.4* 6.6 6.4*  ALBUMIN 4.0 3.1* 2.9*   Recent Labs  Lab 08/12/21 0845 08/13/21 0823 08/14/21 0446  LIPASE 417* 169* 177*   No results for input(s): AMMONIA in the last 168 hours. CBC: Recent Labs  Lab 08/12/21 0845 08/13/21 0421 08/14/21 0446  WBC 4.2 2.8* 2.7*  NEUTROABS  --   --  1.2*  HGB 14.8 12.3* 12.7*  HCT 47.8 38.4* 39.8  MCV 78.1* 77.3* 77.1*  PLT 285 226 217   Cardiac Enzymes: No results for input(s): CKTOTAL, CKMB, CKMBINDEX, TROPONINI in the last 168 hours. BNP: BNP (last 3 results) No results for input(s): BNP in the last 8760 hours.  ProBNP (last 3 results) No results for input(s): PROBNP in the last 8760  hours.  CBG: No results for input(s): GLUCAP in the last 168 hours.     Signed:  Irine Seal MD.  [facility] 08/14/2021, 5:49 PM

## 2021-08-14 NOTE — Progress Notes (Signed)
Patient was given discharge instructions, and all questions were answered. Patient was stable for discharge and was walked to the main exit. 

## 2021-08-16 ENCOUNTER — Ambulatory Visit
Admission: RE | Admit: 2021-08-16 | Discharge: 2021-08-16 | Disposition: A | Discharge status: Home / Self Care | Payer: Medicaid Other | Source: Ambulatory Visit | Attending: Radiation Oncology | Admitting: Radiation Oncology

## 2021-08-16 DIAGNOSIS — C7931 Secondary malignant neoplasm of brain: Secondary | ICD-10-CM

## 2021-08-16 IMAGING — MR MR HEAD WO/W CM
13 series · 48 of 48 positions shown · IV contrast (15 ml multihance)
Comparison: MRI [DATE].

EXAM:
MRI HEAD WITHOUT AND WITH CONTRAST
TECHNIQUE: Multiplanar, multiecho pulse sequences of the brain and surrounding
structures were obtained without and with intravenous contrast.

CONTRAST:  15mL MULTIHANCE GADOBENATE DIMEGLUMINE 529 MG/ML IV SOLN

[Series 2: FLAIR · sagittal · 3.0mm · 0.75mm/px · 3 of 39 slices shown (1 of 2)]
[im 1/39]
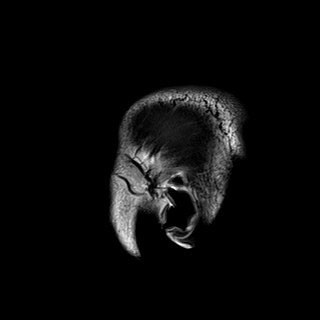
[im 20/39]
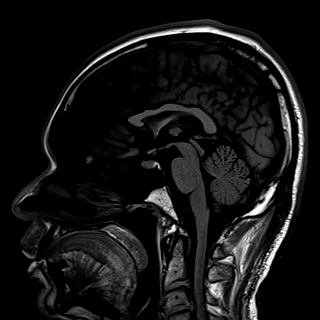
[im 39/39]
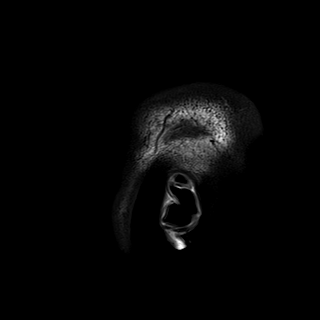

[Series 3: DWI · axial · 3.0mm · 1.50mm/px · z∈[-67,+81]mm · 4 of 78 slices shown (1 of 2)]
[im 1/78]
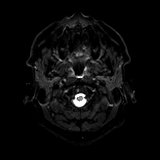
[im 26/78]
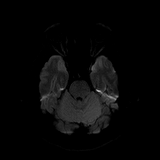
[im 52/78]
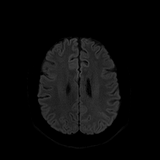
[im 78/78]
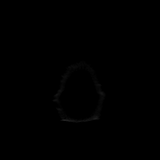

[Series 4: DWI · axial · 3.0mm · 1.50mm/px · z∈[-67,+81]mm · 2 of 37 slices shown (2 of 2)]
[im 1/37]
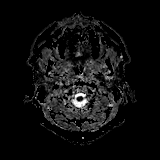
[im 37/37]
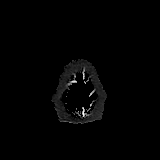

[Series 5: T2 · axial · 5.0mm · 0.57mm/px · 1 of 27 slices shown (1 of 2)]
[im 1/27]
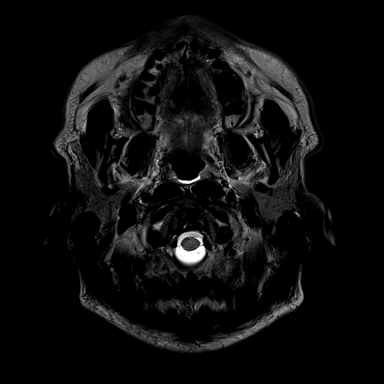

[Series 6: swi_images · axial · 1.5mm · 0.90mm/px · z∈[-64,+78]mm · 4 of 96 slices shown]
[im 1/96]
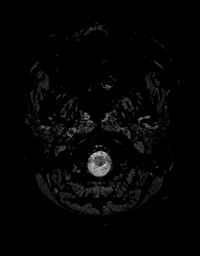
[im 32/96]
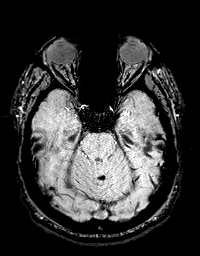
[im 64/96]
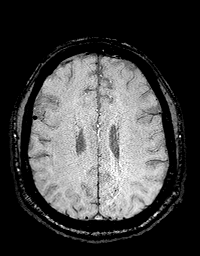
[im 96/96]
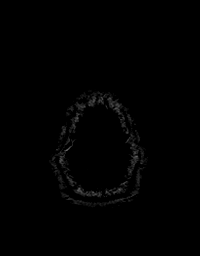

[Series 7: mip_images(sw) · axial · 12.0mm · 0.90mm/px · z∈[-59,+73]mm · 4 of 89 slices shown]
[im 1/89]
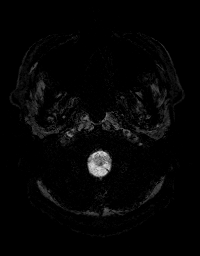
[im 30/89]
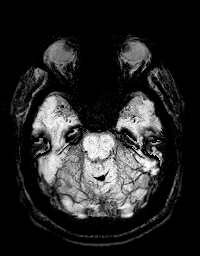
[im 59/89]
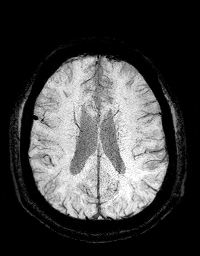
[im 89/89]
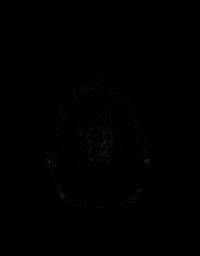

[Series 8: FLAIR · axial · 3.0mm · 0.86mm/px · z∈[-104,+85]mm · 3 of 64 slices shown (2 of 2)]
[im 1/64]
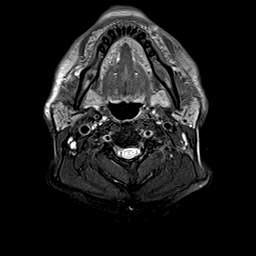
[im 32/64]
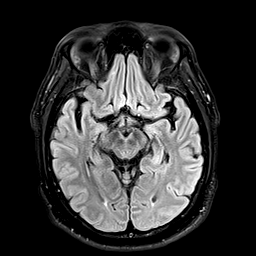
[im 64/64]
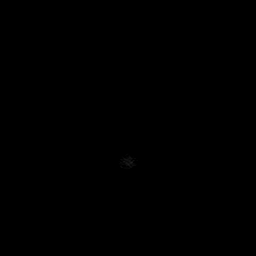

[Series 9: T2 · axial · non-contrast · 1.0mm · 0.86mm/px · z∈[-82,+73]mm · 7 of 160 slices shown (2 of 2)]
[im 1/160]
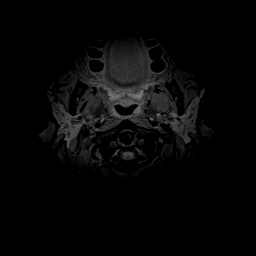
[im 27/160]
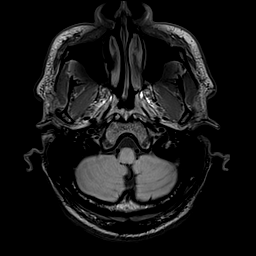
[im 54/160]
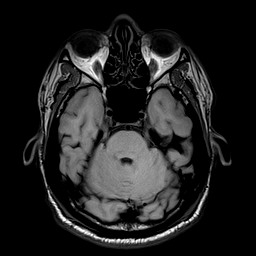
[im 80/160]
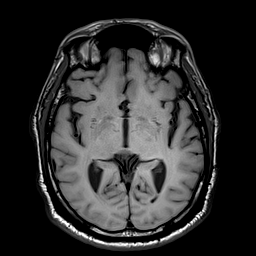
[im 107/160]
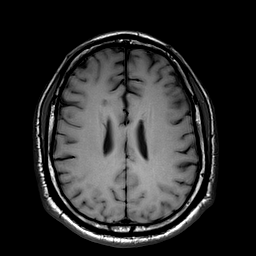
[im 133/160]
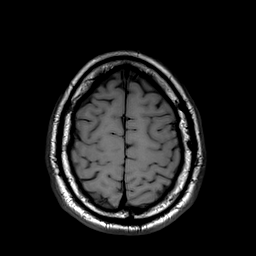
[im 160/160]
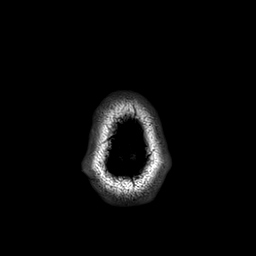

[Series 10: T2 post-contrast · coronal · 3.0mm · 0.57mm/px · 2 of 47 slices shown (1 of 2)]
[im 1/47]
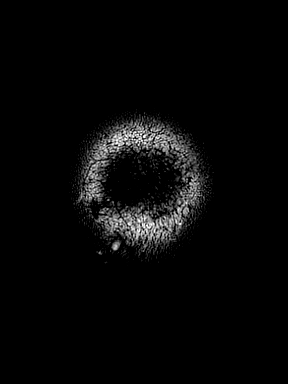
[im 47/47]
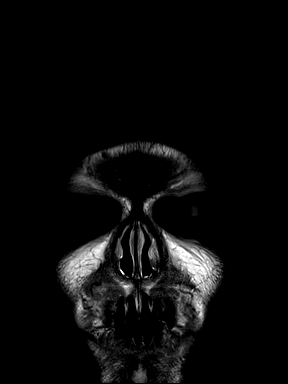

[Series 11: T2 post-contrast · axial · 1.0mm · 0.86mm/px · z∈[-82,+73]mm · 7 of 160 slices shown (2 of 2)]
[im 1/160]
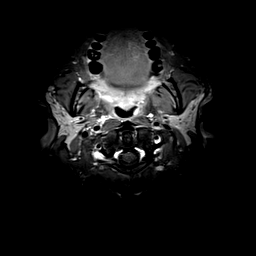
[im 27/160]
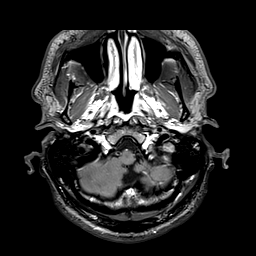
[im 54/160]
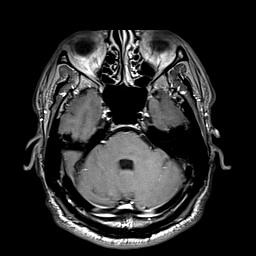
[im 80/160]
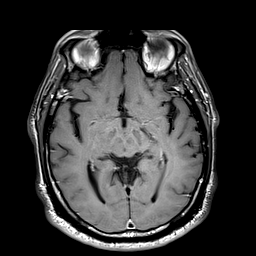
[im 107/160]
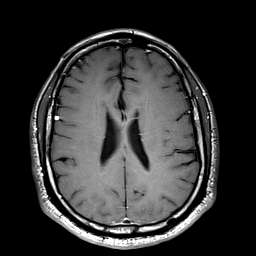
[im 133/160]
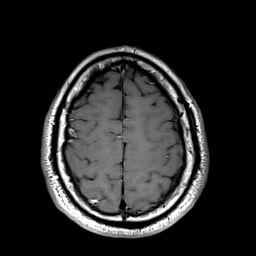
[im 160/160]
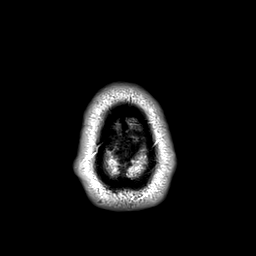

[Series 12: T1 post-contrast · axial · 1.0mm · 0.75mm/px · z∈[-84,+75]mm · 7 of 160 slices shown (1 of 2)]
[im 1/160]
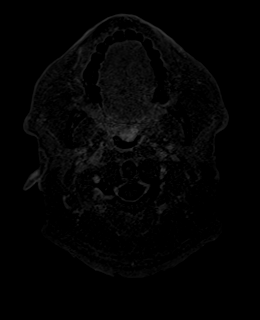
[im 27/160]
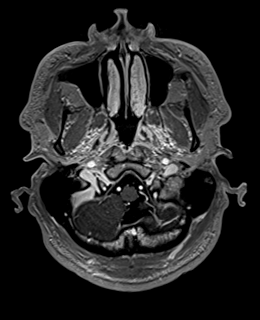
[im 54/160]
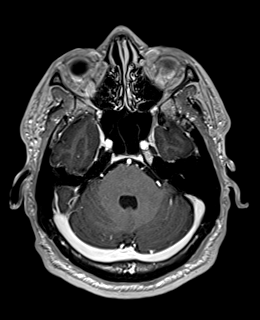
[im 80/160]
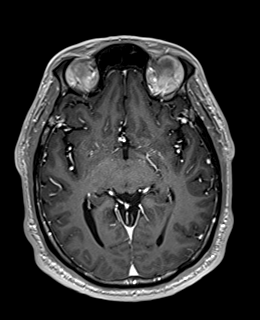
[im 107/160]
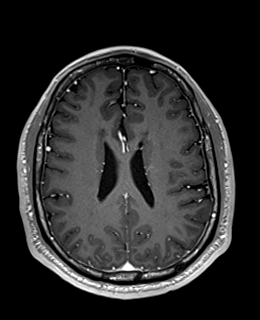
[im 133/160]
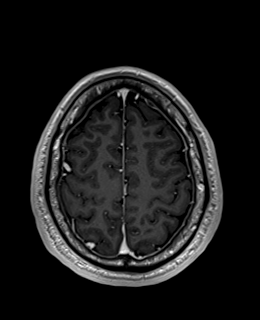
[im 160/160]
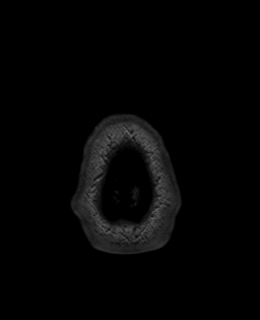

[Series 13: T1 post-contrast · coronal · 3.0mm · 0.57mm/px · 2 of 47 slices shown (2 of 2)]
[im 1/47]
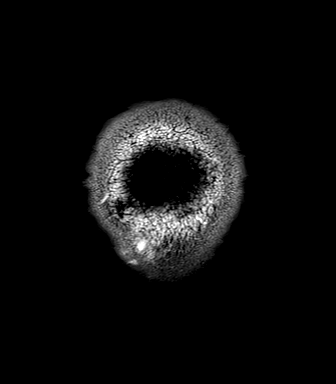
[im 47/47]
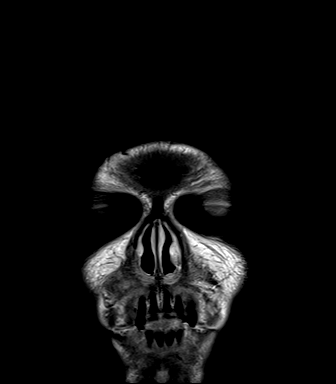

[Series 14: FLAIR post-contrast · sagittal · 3.0mm · 0.75mm/px · 2 of 39 slices shown]
[im 1/39]
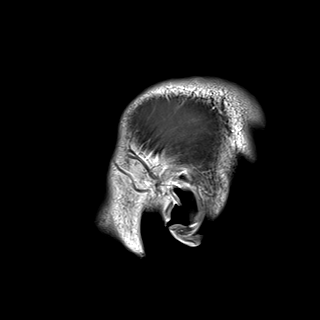
[im 39/39]
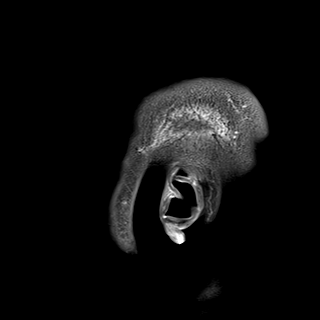

[48 of 48 positions shown; findings below may reference images not displayed]

FINDINGS: Brain: Similar minimal linear enhancement at the site of previously
treated left temporal metastasis with slightly increased surrounding
T2/FLAIR hyperintensity and increased conspicuity of susceptibility
artifact. No progressive or masslike enhancement. Resolution of
previously seen T1 hyperintensity in the cerebellum with correlate
susceptibility artifact/T2 hypointensity in this region and adjacent
developmental venous anomaly. No abnormal enhancement in this
region. No new enhancing lesions identified. No evidence of acute
infarct, acute hemorrhage, midline shift, extra-axial fluid
collection or hydrocephalus.

Vascular: Major arterial flow voids are maintained skull base.

Skull and upper cervical spine: Normal marrow signal.

Sinuses/Orbits: Clear sinuses.  Unremarkable orbits.

Other: No sizable mastoid effusions.
IMPRESSION: 1. Similar minimal linear enhancement at the site of previously
treated left temporal metastasis with slightly increased surrounding
T2/FLAIR hyperintensity and increased conspicuity of susceptibility
artifact. Findings probably reflect treatment related change, but
warrant attention on close interval follow-up to exclude progressive
disease.
2. Resolution of previously seen T1 hyperintensity in the cerebellum
with correlate susceptibility artifact/T2 hypointensity in this
region and adjacent developmental venous anomaly. Findings could
represent a treated hemorrhagic metastasis or prior hemorrhage of a
cavernous malformation. Recommend continued attention on follow-up.
3. No new enhancing lesions or acute abnormality.

## 2021-08-16 MED ORDER — GADOBENATE DIMEGLUMINE 529 MG/ML IV SOLN
15.0000 mL | Freq: Once | INTRAVENOUS | Status: AC | PRN
  Administered 2021-08-16: 15 mL via INTRAVENOUS

## 2021-08-16 NOTE — Progress Notes (Signed)
Dylan Good presents today for follow-up after completing [place] treatment to his brain on 01/29/2021, and to review MRI results from 08/16/2021  Dose of Decadron, if applicable: N/A  Recent neurologic symptoms, if any:  Seizures: None Headaches: Patient denies Nausea: Patient denies Dizziness/ataxia: Patient denies Difficulty with hand coordination: Patient denies Focal numbness/weakness: Patient denies Visual deficits/changes: Patient denies Confusion/Memory deficits: Patient denies  Additional Complaints / other details: Recently hospitalized for pancreatitis flare up (hospitalist recommended he F/U with his medical oncologist Dylan Good to discuss oral chemo regimen). Denies any difficulty sleeping, or struggling with fatigue during the day. Denies changes or issues with bowel/bladder. Now that his pain from pancreatitis has resolved, reports he's feeling well

## 2021-08-17 ENCOUNTER — Ambulatory Visit
Admission: RE | Admit: 2021-08-17 | Discharge: 2021-08-17 | Disposition: A | Discharge status: Home / Self Care | Payer: Medicaid Other | Source: Ambulatory Visit | Attending: Radiation Oncology | Admitting: Radiation Oncology

## 2021-08-17 ENCOUNTER — Other Ambulatory Visit: Payer: Self-pay

## 2021-08-17 VITALS — BP 131/82 | HR 93 | Temp 97.6°F | Resp 18 | Ht 69.0 in | Wt 163.0 lb

## 2021-08-17 DIAGNOSIS — C7931 Secondary malignant neoplasm of brain: Secondary | ICD-10-CM

## 2021-08-17 DIAGNOSIS — Z85528 Personal history of other malignant neoplasm of kidney: Secondary | ICD-10-CM | POA: Diagnosis not present

## 2021-08-20 ENCOUNTER — Other Ambulatory Visit (HOSPITAL_COMMUNITY): Payer: Self-pay

## 2021-08-20 ENCOUNTER — Other Ambulatory Visit: Payer: Self-pay | Admitting: Oncology

## 2021-08-20 ENCOUNTER — Encounter: Payer: Self-pay | Admitting: Radiation Oncology

## 2021-08-20 ENCOUNTER — Inpatient Hospital Stay: Payer: Medicaid Other | Attending: Oncology

## 2021-08-20 NOTE — Progress Notes (Signed)
Radiation Oncology         [phone] ________________________________  Name: MAXIMILLIANO KERSH MRN: [id number]  Date: 08/17/2021  DOB: 11-15-1970  Follow-Up Visit Note  Outpatient  CC: Pcp, No  [name], MD  Diagnosis and Prior Radiotherapy:    ICD-10-CM   1. Brain metastases [facility])  C79.31       Radiation Treatment Dates: 01/29/2021 through 01/29/2021 Site Technique Total Dose (Gy) Dose per Fx (Gy) Completed Fx Beam Energies  Brain: Brain_SRS IMRT 20/20 20 1/1 6XFFF   Left temporal 22mm target and right temporal 2 mm target were treated to 20Gy in 1 fraction with SRS / VMAT technique using 6 MV photons, flattening filter free.  CHIEF COMPLAINT: Here for follow-up and surveillance of brain mets  Narrative:   Mr. Runk presents today for follow-up after completing [place] treatment to his brain on 01/29/2021, and to review MRI results from 08/16/2021  Dose of Decadron, if applicable: N/A  Recent neurologic symptoms, if any:  Seizures: None Headaches: Patient denies Nausea: Patient denies Dizziness/ataxia: Patient denies Difficulty with hand coordination: Patient denies Focal numbness/weakness: Patient denies Visual deficits/changes: Patient denies Confusion/Memory deficits: Patient denies  Additional Complaints / other details: Recently hospitalized for pancreatitis flare up (hospitalist recommended he F/U with his medical oncologist [name] to discuss oral chemo regimen). Denies any difficulty sleeping, or struggling with fatigue during the day. Denies changes or issues with bowel/bladder. Now that his pain from pancreatitis has resolved, reports he's feeling well   ALLERGIES:  has No Known Allergies.  Meds: Current Outpatient Medications  Medication Sig Dispense Refill   amLODipine (NORVASC) 5 MG tablet TAKE 1 TABLET (5 MG TOTAL) BY MOUTH DAILY. 90 tablet 1   diclofenac Sodium (VOLTAREN) 1 % GEL Apply 2 g topically 4 (four) times daily. (Patient not taking:  Reported on 08/12/2021) 100 g 0   docusate sodium (COLACE) 100 MG capsule Take 1 capsule (100 mg total) by mouth 2 (two) times daily. (Patient not taking: Reported on 08/12/2021) 10 capsule 0   nicotine (NICODERM CQ - DOSED IN MG/24 HOURS) 14 mg/24hr patch Place 1 patch (14 mg total) onto the skin daily. 28 patch 0   ondansetron (ZOFRAN) 4 MG tablet Take 1 tablet (4 mg total) by mouth every 6 (six) hours as needed for nausea. 20 tablet 0   oxyCODONE-acetaminophen (PERCOCET/ROXICET) 5-325 MG tablet Take 1 tablet by mouth every 4 (four) hours as needed for moderate pain or severe pain. 30 tablet 0   pantoprazole (PROTONIX) 40 MG tablet Take 1 tablet (40 mg total) by mouth daily. 30 tablet 1   polyethylene glycol (MIRALAX / GLYCOLAX) 17 g packet Take 17 g by mouth 2 (two) times daily. (Patient not taking: Reported on 08/12/2021) 14 each 0   Current Facility-Administered Medications  Medication Dose Route Frequency Provider Last Rate Last Admin   fluticasone furoate-vilanterol (BREO ELLIPTA) 200-25 MCG/ACT 1 puff  1 puff Inhalation Daily Icard, Octavio Graves, DO        Physical Findings:  Today's Vitals   08/17/21 1423  BP: 131/82  Pulse: 93  Resp: 18  Temp: 97.6 F (36.4 C)  SpO2: 100%  Weight: 163 lb (73.9 kg)  Height: 5\' 9"  (1.753 m)   Body mass index is 24.07 kg/m.  Gen: The patient is in no acute distress. Patient is alert and oriented. Heart RRR Chest CTAB Neuro: Extraocular movements are intact, coordination is grossly intact.  Strength is grossly symmetric and intact.  Speech  is fluent. Abdomen has hyperactive bowel sounds   Psych: Insight and judgment are intact.  Pleasant affect  Lab Findings: Lab Results  Component Value Date   WBC 2.7 (L) 08/14/2021   HGB 12.7 (L) 08/14/2021   HCT 39.8 08/14/2021   MCV 77.1 (L) 08/14/2021   PLT 217 08/14/2021    Radiographic Findings: MR Brain W Wo Contrast  Result Date: 08/17/2021 EXAM: MRI HEAD WITHOUT AND WITH CONTRAST TECHNIQUE:  Multiplanar, multiecho pulse sequences of the brain and surrounding structures were obtained without and with intravenous contrast. CONTRAST:  23mL MULTIHANCE GADOBENATE DIMEGLUMINE 529 MG/ML IV SOLN COMPARISON:  MRI June 13, 2021. FINDINGS: Brain: Similar minimal linear enhancement at the site of previously treated left temporal metastasis with slightly increased surrounding T2/FLAIR hyperintensity and increased conspicuity of susceptibility artifact. No progressive or masslike enhancement. Resolution of previously seen T1 hyperintensity in the cerebellum with correlate susceptibility artifact/T2 hypointensity in this region and adjacent developmental venous anomaly. No abnormal enhancement in this region. No new enhancing lesions identified. No evidence of acute infarct, acute hemorrhage, midline shift, extra-axial fluid collection or hydrocephalus. Vascular: Major arterial flow voids are maintained skull base. Skull and upper cervical spine: Normal marrow signal. Sinuses/Orbits: Clear sinuses.  Unremarkable orbits. Other: No sizable mastoid effusions. IMPRESSION: 1. Similar minimal linear enhancement at the site of previously treated left temporal metastasis with slightly increased surrounding T2/FLAIR hyperintensity and increased conspicuity of susceptibility artifact. Findings probably reflect treatment related change, but warrant attention on close interval follow-up to exclude progressive disease. 2. Resolution of previously seen T1 hyperintensity in the cerebellum with correlate susceptibility artifact/T2 hypointensity in this region and adjacent developmental venous anomaly. Findings could represent a treated hemorrhagic metastasis or prior hemorrhage of a cavernous malformation. Recommend continued attention on follow-up. 3. No new enhancing lesions or acute abnormality. Electronically Signed   By: Margaretha Sheffield M.D.   On: 08/17/2021 09:15    Impression/Plan:   NED per brain. I personally review  his images. He will follow-up in 3 months with neurosurgery after next MRI of the brain. He is pleased with this plan.  On date of service, in total, I spent 15 minutes on this encounter. Patient was seen in person.  _____________________________________   Eppie Renville, MD

## 2021-08-21 ENCOUNTER — Other Ambulatory Visit (HOSPITAL_COMMUNITY): Payer: Self-pay

## 2021-08-21 ENCOUNTER — Other Ambulatory Visit: Payer: Self-pay | Admitting: Oncology

## 2021-08-21 ENCOUNTER — Ambulatory Visit: Payer: Medicaid Other | Admitting: Radiation Oncology

## 2021-08-21 ENCOUNTER — Other Ambulatory Visit: Payer: Self-pay | Admitting: Radiation Therapy

## 2021-08-21 DIAGNOSIS — C7931 Secondary malignant neoplasm of brain: Secondary | ICD-10-CM

## 2021-08-22 ENCOUNTER — Telehealth: Payer: Self-pay | Admitting: Oncology

## 2021-08-22 ENCOUNTER — Other Ambulatory Visit: Payer: Self-pay | Admitting: Radiation Therapy

## 2021-08-22 NOTE — Telephone Encounter (Signed)
Sch per 2/14 inbasket, left msg for pt

## 2021-08-24 DIAGNOSIS — I700 Atherosclerosis of aorta: Secondary | ICD-10-CM | POA: Insufficient documentation

## 2021-08-27 ENCOUNTER — Inpatient Hospital Stay: Payer: Medicaid Other | Admitting: Oncology

## 2021-08-27 ENCOUNTER — Inpatient Hospital Stay: Payer: Medicaid Other

## 2021-08-27 ENCOUNTER — Other Ambulatory Visit (HOSPITAL_COMMUNITY): Payer: Self-pay

## 2021-08-28 ENCOUNTER — Inpatient Hospital Stay (HOSPITAL_COMMUNITY)
Admission: EM | Admit: 2021-08-28 | Discharge: 2021-09-02 | DRG: 438 | Disposition: A | Discharge status: Home / Self Care | Payer: Medicaid Other | Attending: Family Medicine | Admitting: Family Medicine

## 2021-08-28 ENCOUNTER — Emergency Department (HOSPITAL_COMMUNITY): Payer: Medicaid Other

## 2021-08-28 ENCOUNTER — Encounter (HOSPITAL_COMMUNITY): Payer: Self-pay

## 2021-08-28 DIAGNOSIS — C7931 Secondary malignant neoplasm of brain: Secondary | ICD-10-CM | POA: Diagnosis not present

## 2021-08-28 DIAGNOSIS — I10 Essential (primary) hypertension: Secondary | ICD-10-CM | POA: Diagnosis not present

## 2021-08-28 DIAGNOSIS — D709 Neutropenia, unspecified: Secondary | ICD-10-CM | POA: Diagnosis present

## 2021-08-28 DIAGNOSIS — Z79899 Other long term (current) drug therapy: Secondary | ICD-10-CM

## 2021-08-28 DIAGNOSIS — C641 Malignant neoplasm of right kidney, except renal pelvis: Secondary | ICD-10-CM

## 2021-08-28 DIAGNOSIS — C7972 Secondary malignant neoplasm of left adrenal gland: Secondary | ICD-10-CM | POA: Diagnosis present

## 2021-08-28 DIAGNOSIS — C782 Secondary malignant neoplasm of pleura: Secondary | ICD-10-CM | POA: Diagnosis present

## 2021-08-28 DIAGNOSIS — C7801 Secondary malignant neoplasm of right lung: Secondary | ICD-10-CM | POA: Diagnosis present

## 2021-08-28 DIAGNOSIS — C7800 Secondary malignant neoplasm of unspecified lung: Secondary | ICD-10-CM | POA: Diagnosis present

## 2021-08-28 DIAGNOSIS — K859 Acute pancreatitis without necrosis or infection, unspecified: Secondary | ICD-10-CM | POA: Diagnosis not present

## 2021-08-28 DIAGNOSIS — K861 Other chronic pancreatitis: Secondary | ICD-10-CM | POA: Diagnosis present

## 2021-08-28 DIAGNOSIS — C7802 Secondary malignant neoplasm of left lung: Secondary | ICD-10-CM | POA: Diagnosis present

## 2021-08-28 DIAGNOSIS — J90 Pleural effusion, not elsewhere classified: Secondary | ICD-10-CM | POA: Diagnosis present

## 2021-08-28 DIAGNOSIS — C781 Secondary malignant neoplasm of mediastinum: Secondary | ICD-10-CM | POA: Diagnosis present

## 2021-08-28 DIAGNOSIS — C649 Malignant neoplasm of unspecified kidney, except renal pelvis: Secondary | ICD-10-CM | POA: Diagnosis present

## 2021-08-28 DIAGNOSIS — F17210 Nicotine dependence, cigarettes, uncomplicated: Secondary | ICD-10-CM | POA: Diagnosis present

## 2021-08-28 DIAGNOSIS — U071 COVID-19: Secondary | ICD-10-CM | POA: Diagnosis present

## 2021-08-28 LAB — PROTIME-INR
INR: 1.1 (ref 0.8–1.2)
Prothrombin Time: 14.6 seconds (ref 11.4–15.2)

## 2021-08-28 LAB — LACTIC ACID, PLASMA: Lactic Acid, Venous: 1.5 mmol/L (ref 0.5–1.9)

## 2021-08-28 LAB — CBC WITH DIFFERENTIAL/PLATELET
Abs Immature Granulocytes: 0 10*3/uL (ref 0.00–0.07)
Basophils Absolute: 0 10*3/uL (ref 0.0–0.1)
Basophils Relative: 1 %
Eosinophils Absolute: 0.1 10*3/uL (ref 0.0–0.5)
Eosinophils Relative: 2 %
HCT: 40 % (ref 39.0–52.0)
Hemoglobin: 12.7 g/dL — ABNORMAL LOW (ref 13.0–17.0)
Immature Granulocytes: 0 %
Lymphocytes Relative: 18 %
Lymphs Abs: 0.6 10*3/uL — ABNORMAL LOW (ref 0.7–4.0)
MCH: 24.7 pg — ABNORMAL LOW (ref 26.0–34.0)
MCHC: 31.8 g/dL (ref 30.0–36.0)
MCV: 77.8 fL — ABNORMAL LOW (ref 80.0–100.0)
Monocytes Absolute: 0.5 10*3/uL (ref 0.1–1.0)
Monocytes Relative: 15 %
Neutro Abs: 2.2 10*3/uL (ref 1.7–7.7)
Neutrophils Relative %: 64 %
Platelets: 251 10*3/uL (ref 150–400)
RBC: 5.14 MIL/uL (ref 4.22–5.81)
RDW: 16.8 % — ABNORMAL HIGH (ref 11.5–15.5)
WBC: 3.4 10*3/uL — ABNORMAL LOW (ref 4.0–10.5)
nRBC: 0 % (ref 0.0–0.2)

## 2021-08-28 LAB — RESP PANEL BY RT-PCR (FLU A&B, COVID) ARPGX2
Influenza A by PCR: NEGATIVE
Influenza B by PCR: NEGATIVE
SARS Coronavirus 2 by RT PCR: POSITIVE — AB

## 2021-08-28 LAB — COMPREHENSIVE METABOLIC PANEL
ALT: 12 U/L (ref 0–44)
AST: 16 U/L (ref 15–41)
Albumin: 3.8 g/dL (ref 3.5–5.0)
Alkaline Phosphatase: 71 U/L (ref 38–126)
Anion gap: 8 (ref 5–15)
BUN: 8 mg/dL (ref 6–20)
CO2: 22 mmol/L (ref 22–32)
Calcium: 8.7 mg/dL — ABNORMAL LOW (ref 8.9–10.3)
Chloride: 103 mmol/L (ref 98–111)
Creatinine, Ser: 0.86 mg/dL (ref 0.61–1.24)
GFR, Estimated: >60 mL/min (ref >60)
Glucose, Bld: 97 mg/dL (ref 70–99)
Potassium: 3.5 mmol/L (ref 3.5–5.1)
Sodium: 133 mmol/L — ABNORMAL LOW (ref 135–145)
Total Bilirubin: 1 mg/dL (ref 0.3–1.2)
Total Protein: 7.8 g/dL (ref 6.5–8.1)

## 2021-08-28 LAB — LIPASE, BLOOD: Lipase: 200 U/L — ABNORMAL HIGH (ref 11–51)

## 2021-08-28 LAB — TROPONIN I (HIGH SENSITIVITY): Troponin I (High Sensitivity): 2 ng/L (ref <18)

## 2021-08-28 IMAGING — CT CT ABD-PELV W/ CM
2 of 5 series · 13 of 46 positions shown, 15 images · IV contrast (agent unspecified)
Comparison: CT abdomen pelvis [DATE], CT chest abdomen pelvis
[DATE].

CLINICAL DATA: Pulmonary embolism (PE) suspected, high prob;
Sepsis. Patient with left-sided chest, rib, flank pain. Patient has
history of metastatic cancer. He did not know he had a fever until
he arrived today. Has cough which is chronic. Has had multiple
episodes of emesis. Recently discharged for pancreatitis.

Metastatic urologic cancer.
EXAM:
CT ANGIOGRAPHY CHEST
CT ABDOMEN AND PELVIS WITH CONTRAST
TECHNIQUE: Multidetector CT imaging of the chest was performed using the
standard protocol during bolus administration of intravenous
contrast. Multiplanar CT image reconstructions and MIPs were
obtained to evaluate the vascular anatomy. Multidetector CT imaging
of the abdomen and pelvis was performed using the standard protocol
during bolus administration of intravenous contrast.

[Series 2: axial st · axial · 0.92mm/px · z∈[-670,-250]mm · 10 of 98 slices shown, 12 images]
[im 7/98  soft-tissue]
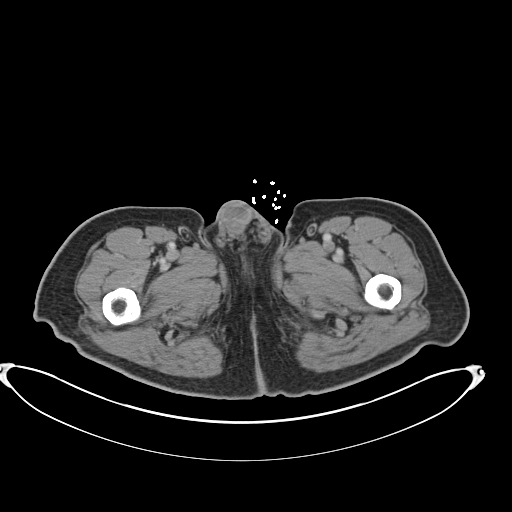
[im 7/98  bone]
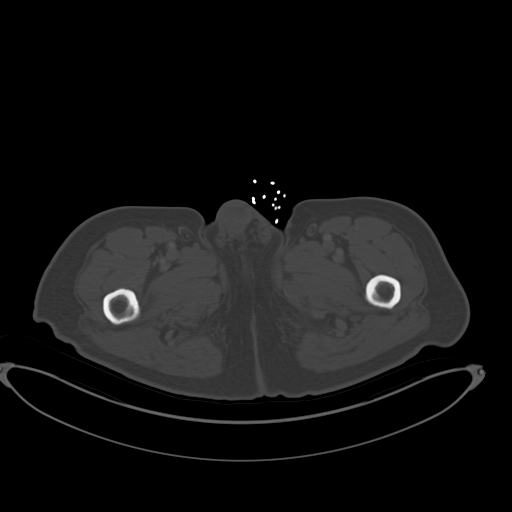
[im 20/98  soft-tissue]
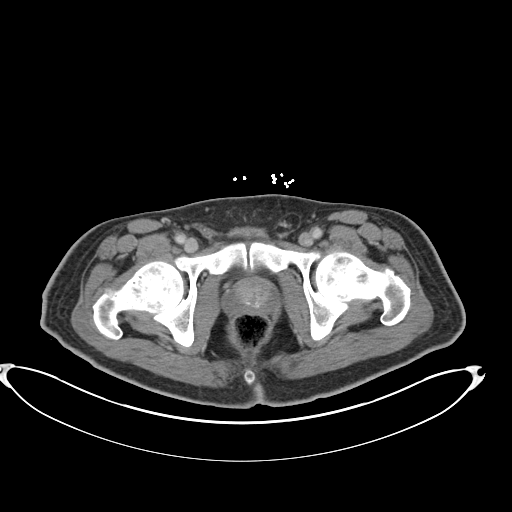
[im 26/98  soft-tissue]
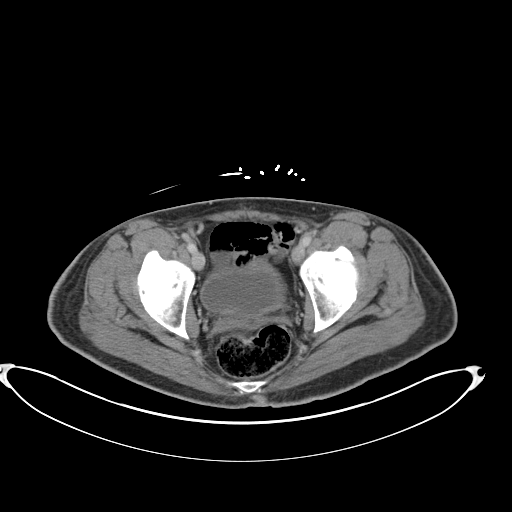
[im 33/98  soft-tissue]
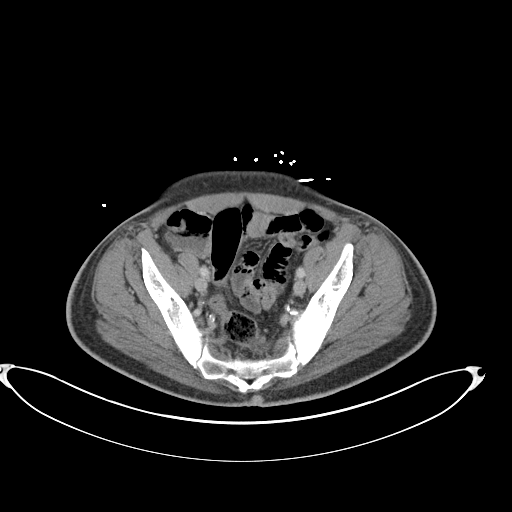
[im 46/98  soft-tissue]
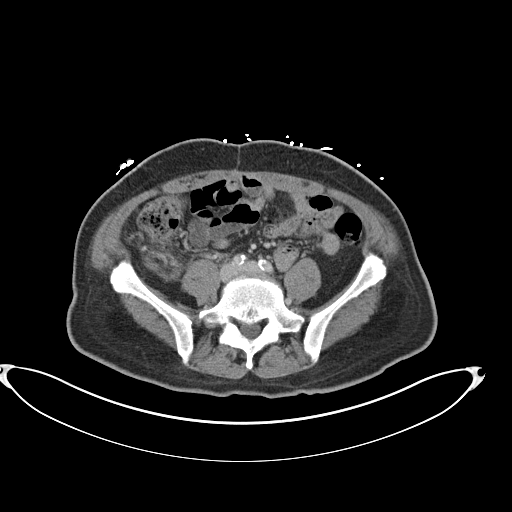
[im 52/98  soft-tissue]
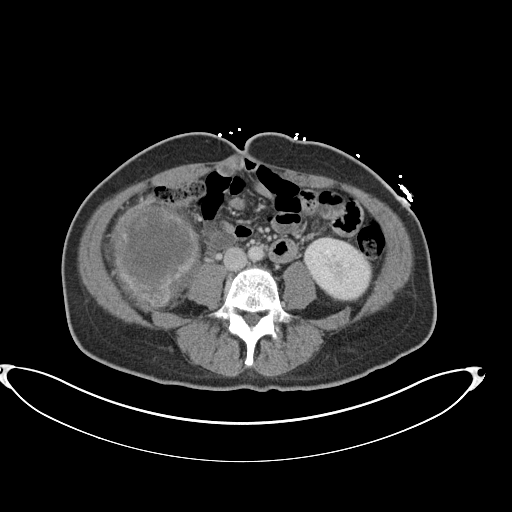
[im 65/98  soft-tissue]
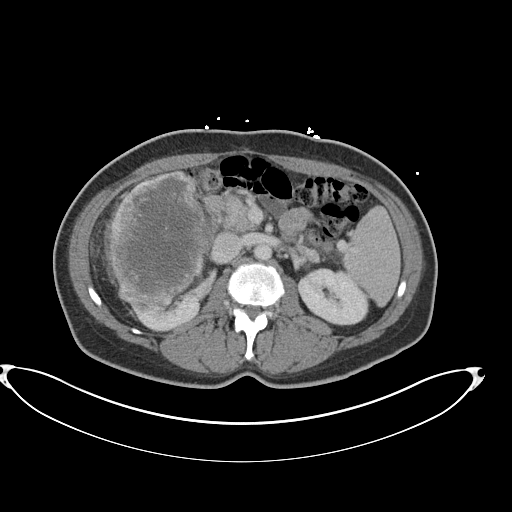
[im 72/98  soft-tissue]
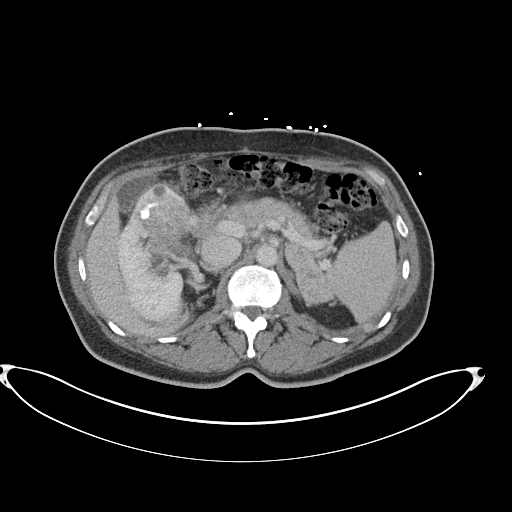
[im 78/98  soft-tissue]
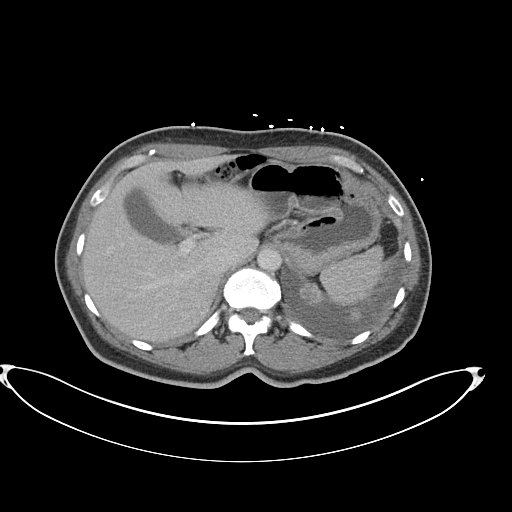
[im 78/98  bone]
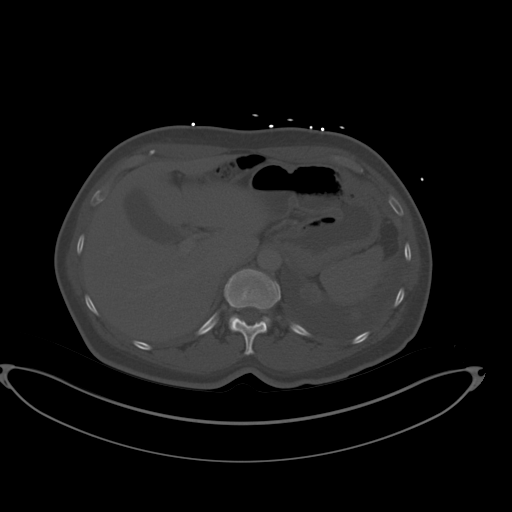
[im 91/98  soft-tissue]
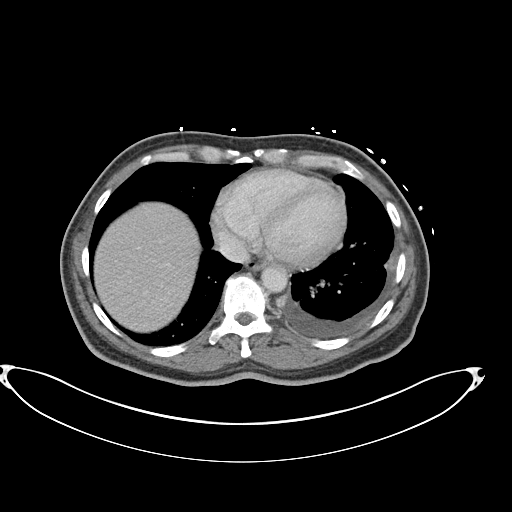

[Series 4: coronal st · coronal · 0.83mm/px · 3 of 101 slices shown]
[im 34/101  soft-tissue]
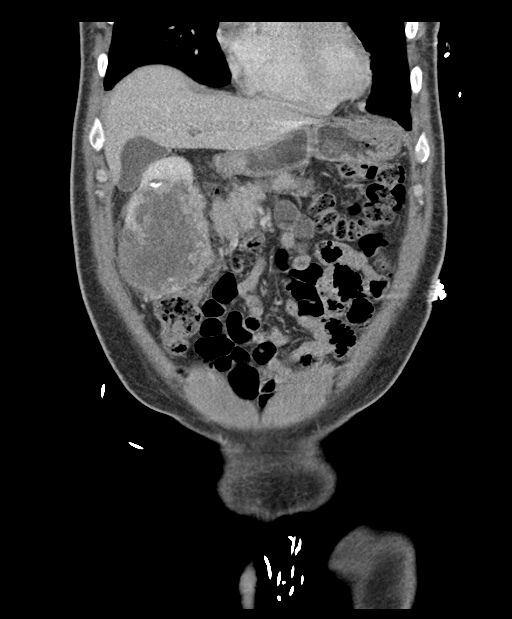
[im 45/101  soft-tissue]
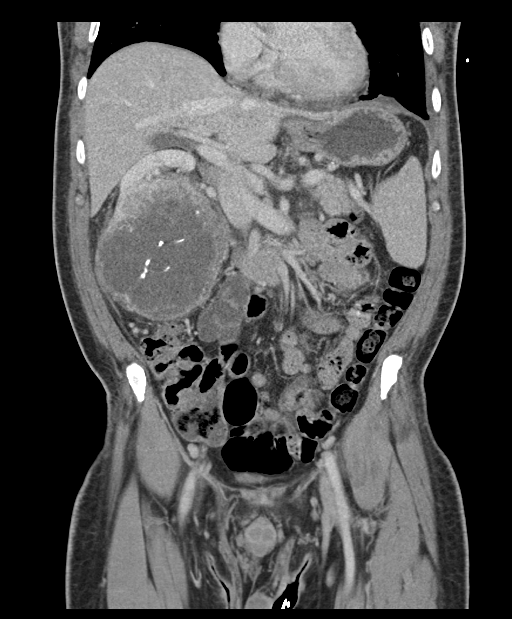
[im 56/101  soft-tissue]
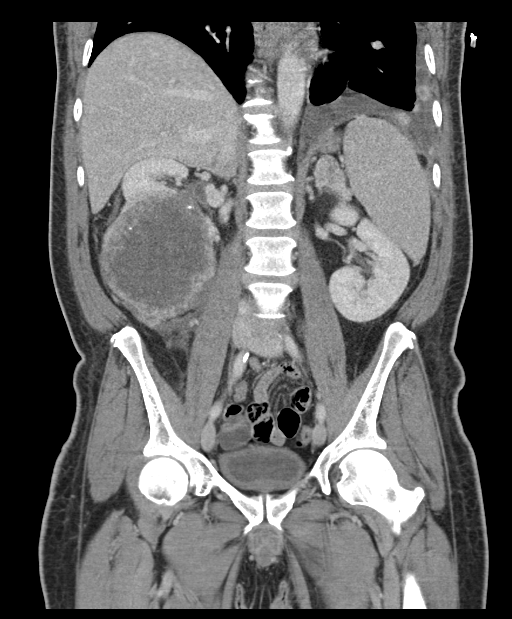

[13 of 46 positions shown; findings below may reference images not displayed]

RADIATION DOSE REDUCTION: This exam was performed according to the
departmental dose-optimization program which includes automated
exposure control, adjustment of the mA and/or kV according to
patient size and/or use of iterative reconstruction technique.

CONTRAST:  100mL OMNIPAQUE IOHEXOL 350 MG/ML SOLN
FINDINGS: CTA CHEST FINDINGS

Cardiovascular: Satisfactory opacification of the pulmonary arteries
to the segmental level. No evidence of pulmonary embolism. Normal
heart size. No significant pericardial effusion. The thoracic aorta
is normal in caliber. No atherosclerotic plaque of the thoracic
aorta. No coronary artery calcifications.

Mediastinum/Nodes: Interval decrease in size of conglomerative
mediastinal lymphadenopathy with as an example a subcarinal 2 x
cm lesion (from 5.9 x 3.1 cm). Interval decrease in hilar
lymphadenopathy with as an example a 1.8 cm (from 2.6 cm) right
hilar lymph node ([DATE]). No axillary lymph nodes. Thyroid gland,
trachea, and esophagus demonstrate no significant findings.

Lungs/Pleura: Interval decrease in size of innumerable pulmonary
metastases with as an example a 3.3 x 2.6 cm (from 3.7 x 3.6 cm)
left lower lobe ([DATE]) status as well as a left upper lobe 1.8 x
2.1 cm ([DATE]) right upper lobe (from 2.2 x 2.1 cm). No pulmonary
mass. Stable left pleural effusion. Redemonstration of interval
decrease in size of associated left pleural nodularity/mass ([DATE])
now measuring 8.2 x 1.3 cm (from 8.7 x 2.3 cm). No pneumothorax.

Musculoskeletal:

No chest wall abnormality.

No suspicious lytic or blastic osseous lesions. No acute displaced
fracture. Interval decrease in size of a left lateral fifth rib soft
tissue metastasis now measuring 6.3 x 4.1 cm (from 6.8 x 4.9 cm)
with associated persistent resorption/destruction of the lateral
fifth rib.

Review of the MIP images confirms the above findings.

CT ABDOMEN and PELVIS FINDINGS

Hepatobiliary: No focal liver abnormality. No gallstones,
gallbladder wall thickening, or pericholecystic fluid. No biliary
dilatation.

Pancreas: No focal lesion. Query slightly hazy contour of the
pancreatic head. Otherwise no surrounding inflammatory changes. No
main pancreatic ductal dilatation. No pancreatic pseudocyst
formation.

Spleen: Normal in size without focal abnormality.

Adrenals/Urinary Tract:

Grossly stable 6 x 2.4 cm (from 6 x 2.6 cm) left adrenal gland
metastatic lesion ([DATE]). The right adrenal gland is normal with no
nodularity.

Bilateral kidneys enhance symmetrically. Redemonstration of a
grossly stable in size heterogeneous 12 x 10 cm right renal lesion.
Redemonstration of several other subcentimeter hypodensities within
bilateral kidneys. Interval decrease in size of a heterogeneous
x 2.1 (from 2.2 x 2.3 cm) left renal lesion ([DATE]).

No hydronephrosis. No hydroureter.

The urinary bladder is unremarkable.

Stomach/Bowel: Stomach is within normal limits. No evidence of bowel
wall thickening or dilatation. The appendix not definitely
identified.

Vascular/Lymphatic: The inferior vena cava is patent. The hepatic,
portal, splenic, superior mesenteric veins are patent. The right
renal vein appears to be patent. The left renal vein appears to be
patent. No abdominal aorta or iliac aneurysm. Mild atherosclerotic
plaque of the aorta and its branches. No abdominal, pelvic, or
inguinal lymphadenopathy.

Reproductive: Prominent prostate.

Other: No intraperitoneal free fluid. No intraperitoneal free gas.
No organized fluid collection.

Musculoskeletal:

No abdominal wall hernia or abnormality.

No suspicious lytic or blastic osseous lesions. No acute displaced
fracture. Multilevel degenerative changes of the spine. Bilateral
sacroiliac joint degenerative changes.

Review of the MIP images confirms the above findings.
IMPRESSION: 1. No pulmonary embolus.
2. Query slightly hazy contour of the pancreatic head. Correlate
with lipase levels for acute pancreatitis.
3. Grossly stable 12 cm right renal malignancy.
4. Interval decrease in size of multiple known metastases including
bilateral pulmonary metastases, left pleural metastases, hilar and
mediastinal lymphadenopathy, as well as a left chest wall metastasis
involving the left fifth rib.
5. Grossly stable left adrenal gland metastasis.
6. Grossly stable to possibly slightly decreased in size 2.1 cm left
renal malignancy.

## 2021-08-28 IMAGING — CR DG CHEST 2V
2 series · 2 of 2 positions shown · non-contrast
Comparison: [DATE]

CLINICAL DATA: Cough and fever.  Metastatic renal carcinoma.

EXAM:
CHEST - 2 VIEW

[w chest pa]
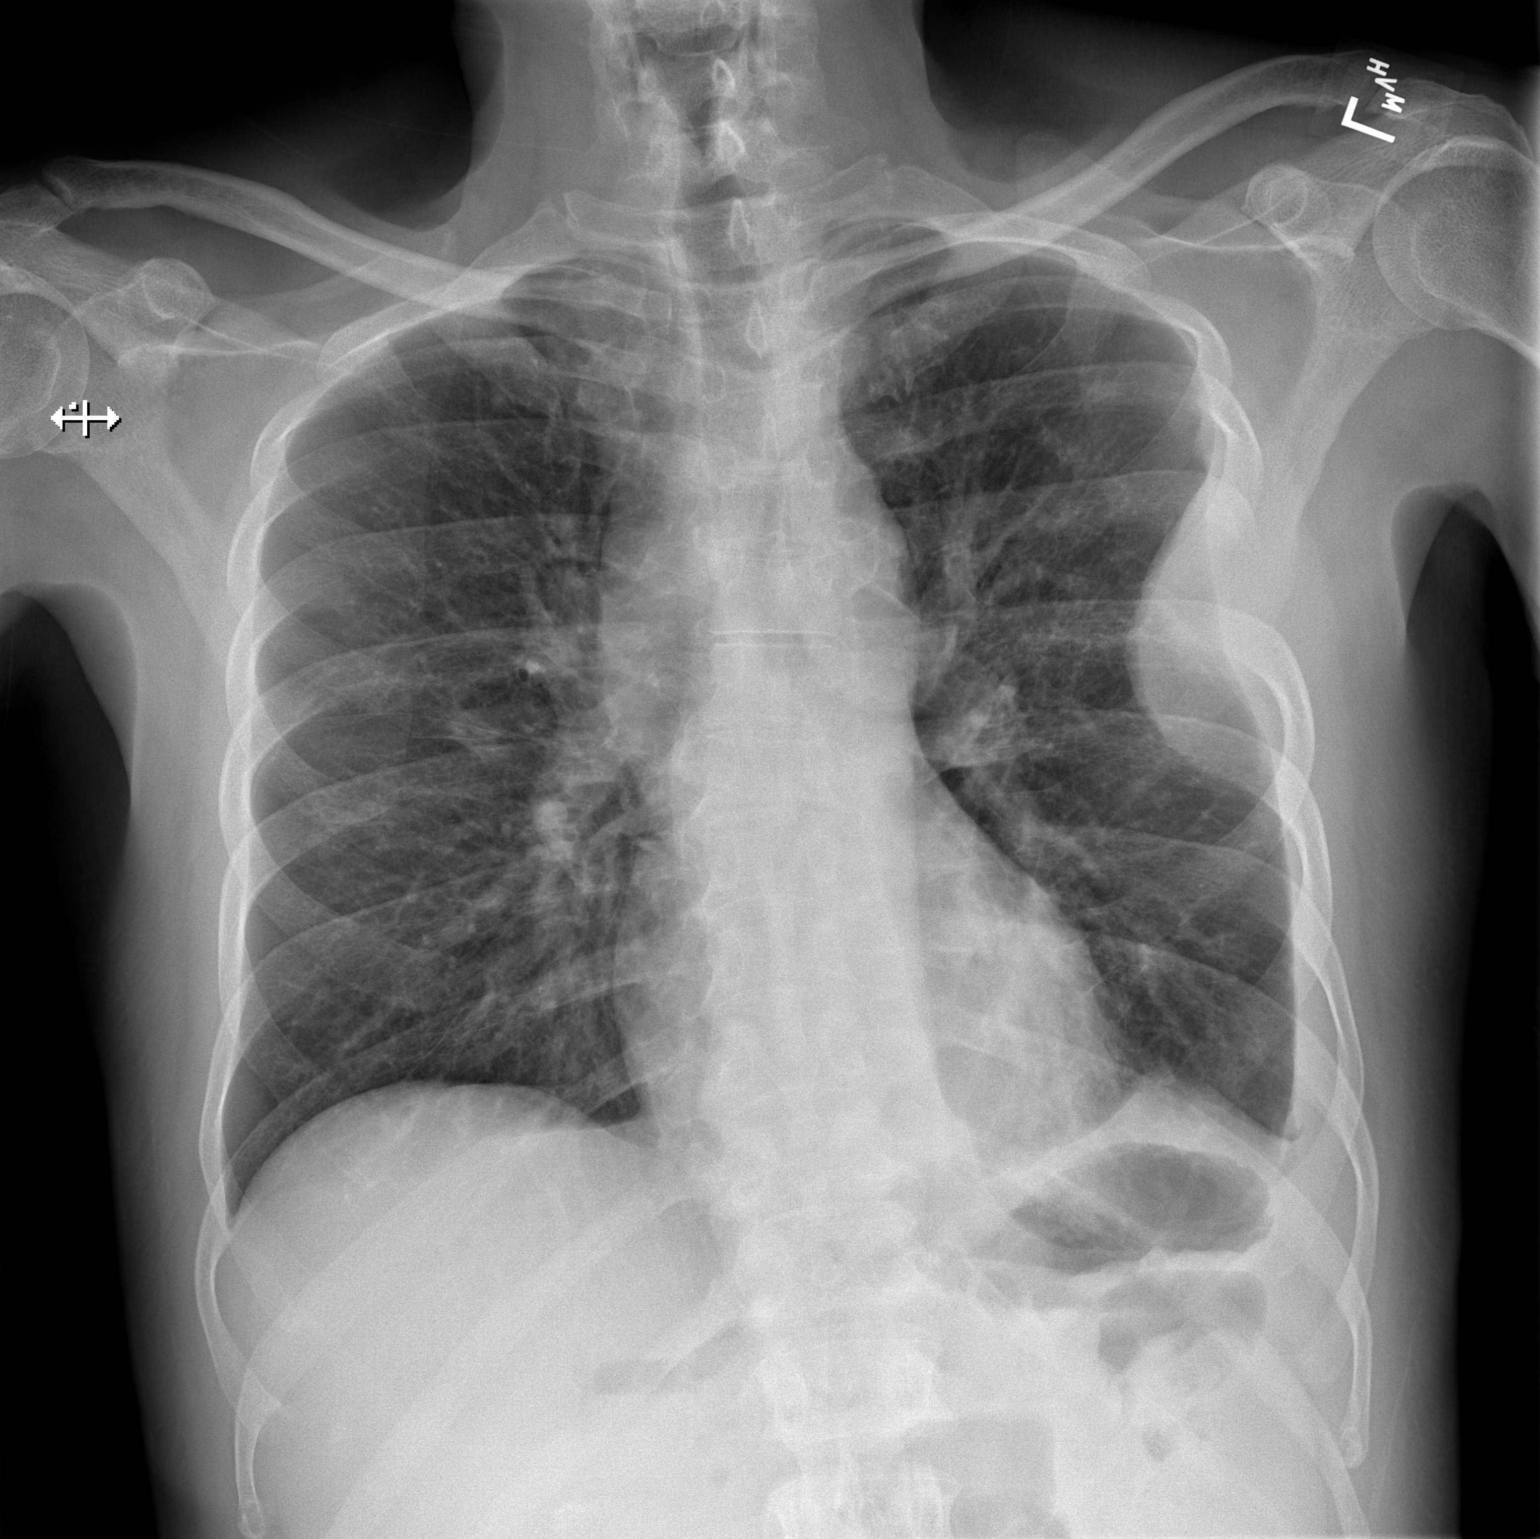

[w chest lat]
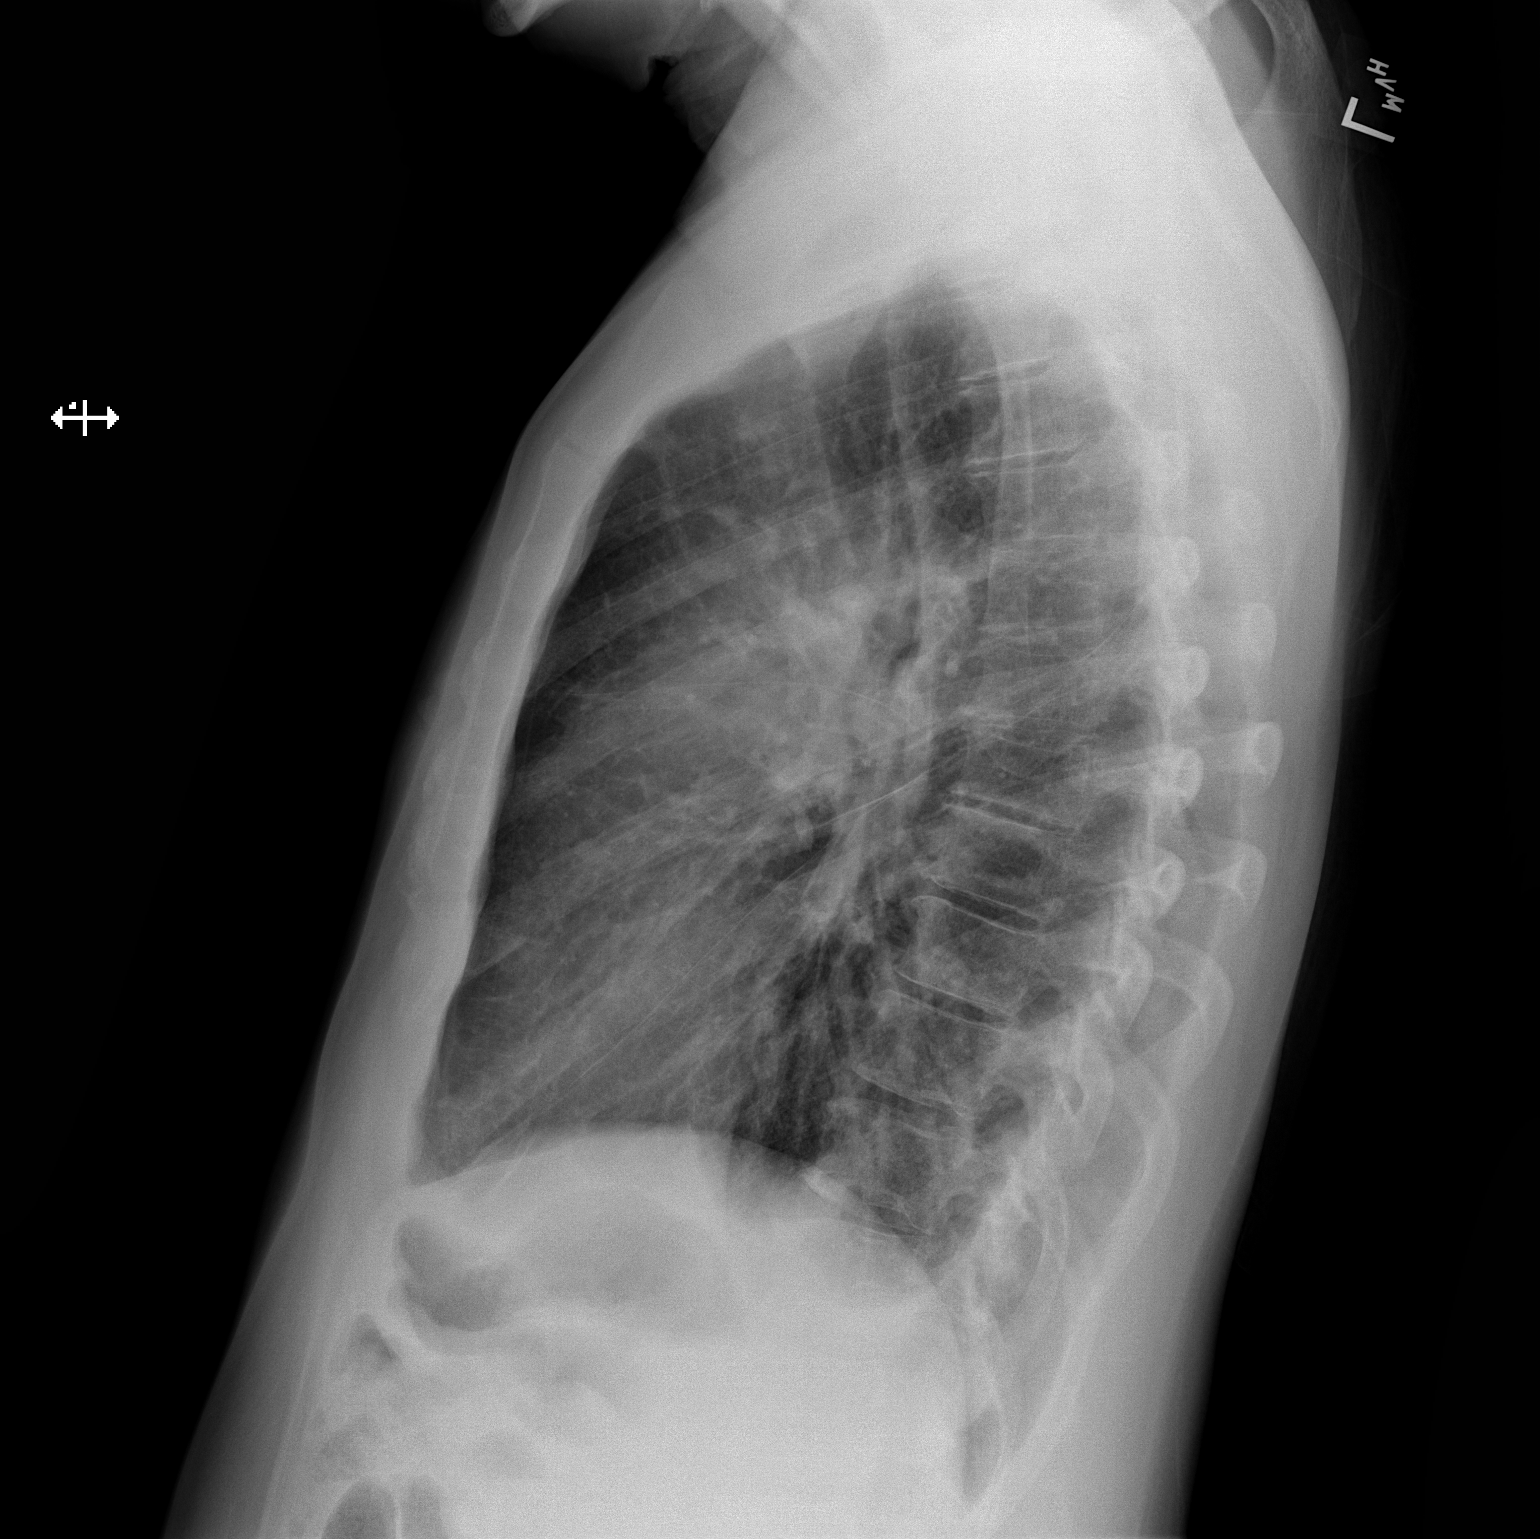

[2 of 2 positions shown; findings below may reference images not displayed]

FINDINGS: Multiple bilateral pulmonary nodules are best seen on prior CT. Lung
nodules the right appear mildly improved from the prior study.
Decreased nodularity and infiltrate in the left lower lobe.

Large extrapleural mass and rib destruction involving the left fifth
rib laterally. This is similar to the prior study. Small left
pleural effusion with mild interval improvement.

Right paratracheal adenopathy similar to the prior study.
IMPRESSION: Negative for acute pneumonia

Metastatic disease in the chest. Several lung nodules appear
smaller.

Improved aeration left lower lobe.  Decreased left effusion.

## 2021-08-28 IMAGING — CT CT ANGIO CHEST
2 of 6 series · 15 of 46 positions shown · IV contrast (agent unspecified)
Comparison: CT abdomen pelvis [DATE], CT chest abdomen pelvis
[DATE].

CLINICAL DATA: Pulmonary embolism (PE) suspected, high prob;
Sepsis. Patient with left-sided chest, rib, flank pain. Patient has
history of metastatic cancer. He did not know he had a fever until
he arrived today. Has cough which is chronic. Has had multiple
episodes of emesis. Recently discharged for pancreatitis.

Metastatic urologic cancer.
EXAM:
CT ANGIOGRAPHY CHEST
CT ABDOMEN AND PELVIS WITH CONTRAST
TECHNIQUE: Multidetector CT imaging of the chest was performed using the
standard protocol during bolus administration of intravenous
contrast. Multiplanar CT image reconstructions and MIPs were
obtained to evaluate the vascular anatomy. Multidetector CT imaging
of the abdomen and pelvis was performed using the standard protocol
during bolus administration of intravenous contrast.

[Series 7: thins · axial · 0.93mm/px · z∈[-336,-49]mm · 12 of 315 slices shown]
[im 14/315  lung]
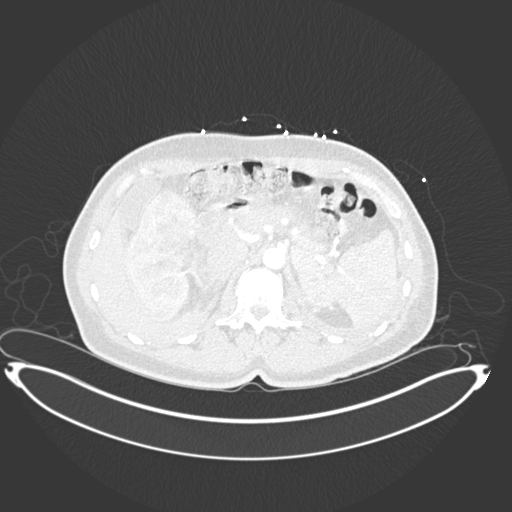
[im 41/315  soft-tissue]
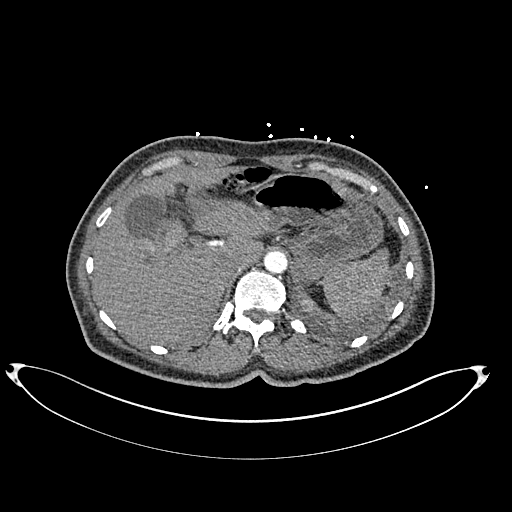
[im 69/315  lung]
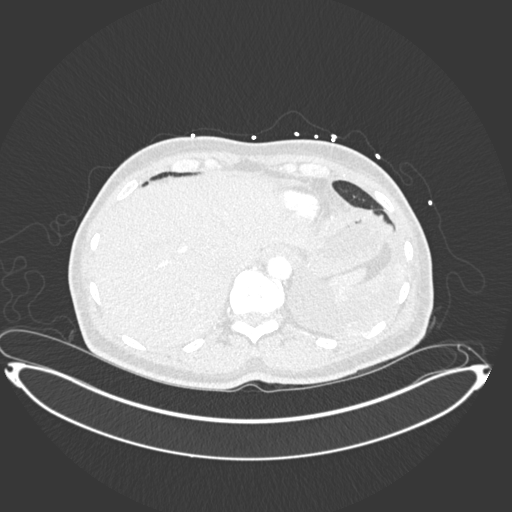
[im 96/315  soft-tissue]
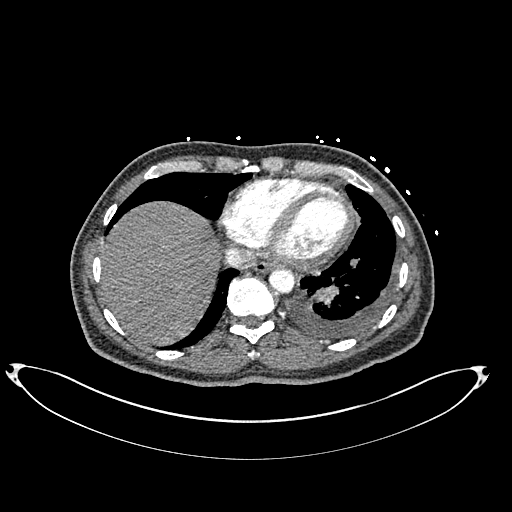
[im 123/315  lung]
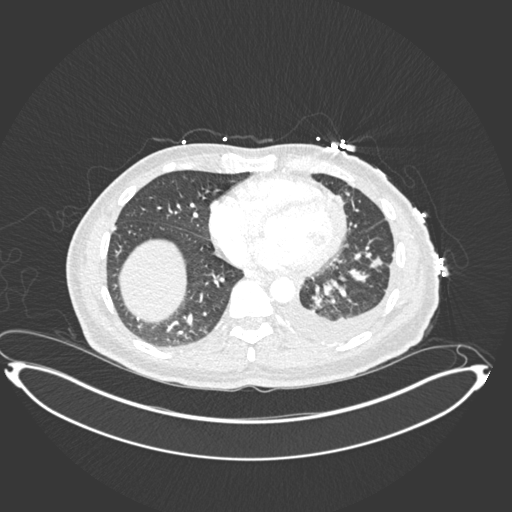
[im 151/315  soft-tissue]
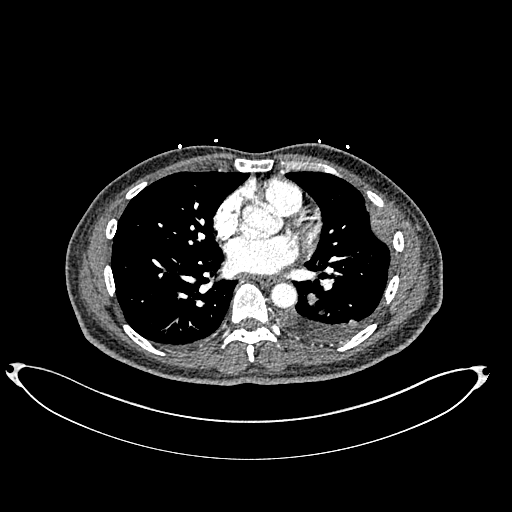
[im 164/315  lung]
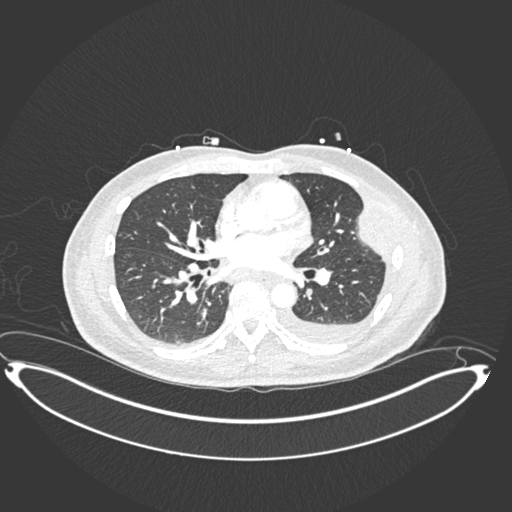
[im 192/315  soft-tissue]
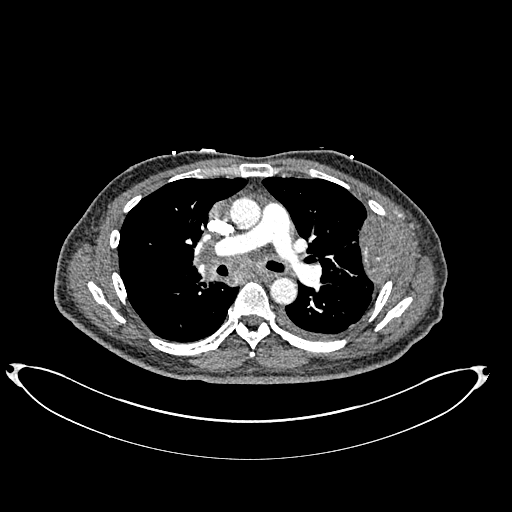
[im 219/315  lung]
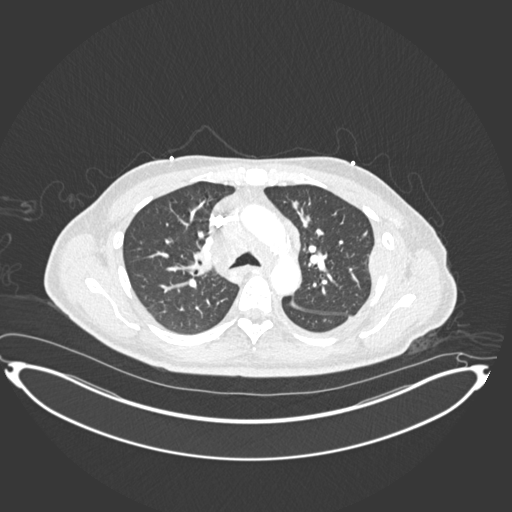
[im 246/315  soft-tissue]
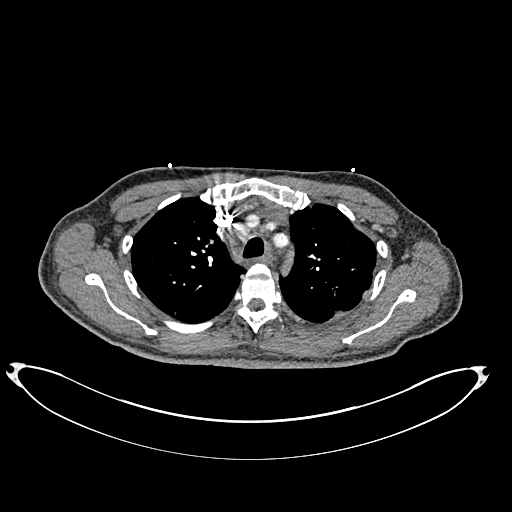
[im 274/315  lung]
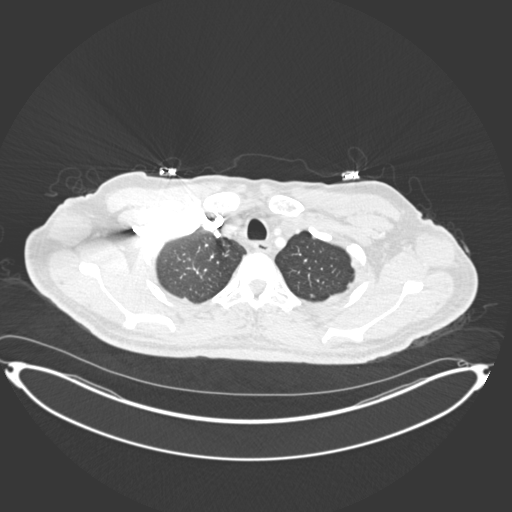
[im 301/315  soft-tissue]
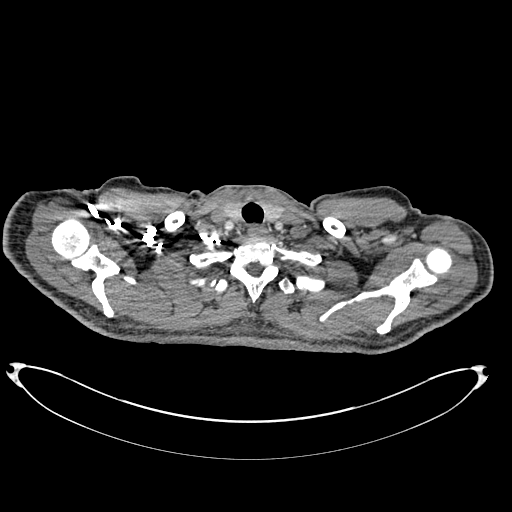

[Series 8: coronal mpr · coronal · 0.65mm/px · 3 of 147 slices shown]
[im 37/147  soft-tissue]
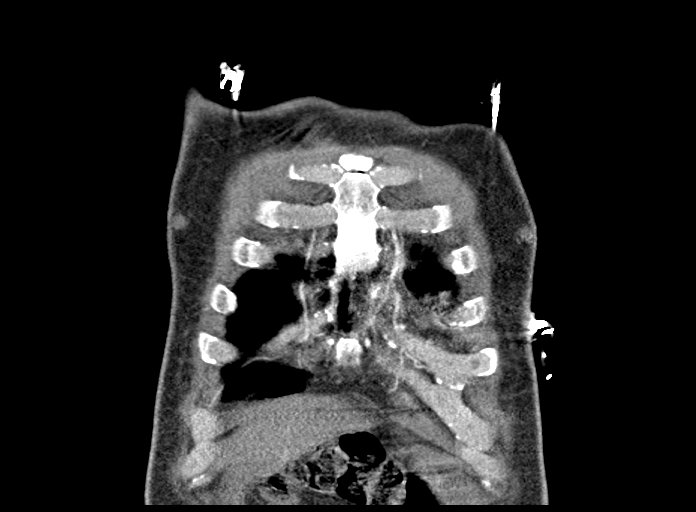
[im 74/147  soft-tissue]
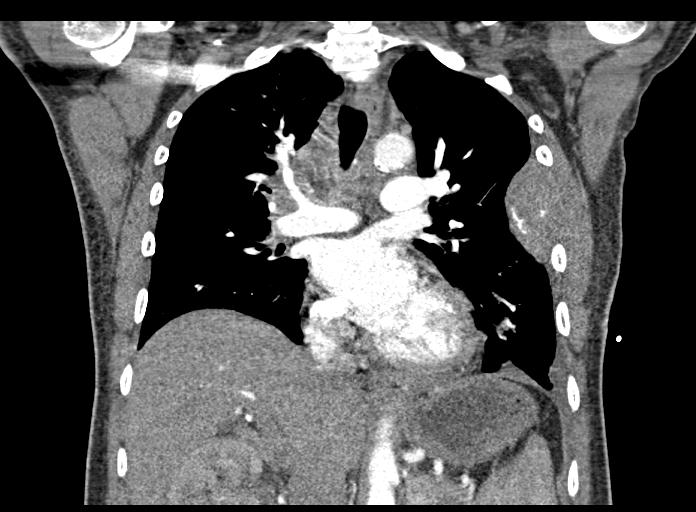
[im 110/147  soft-tissue]
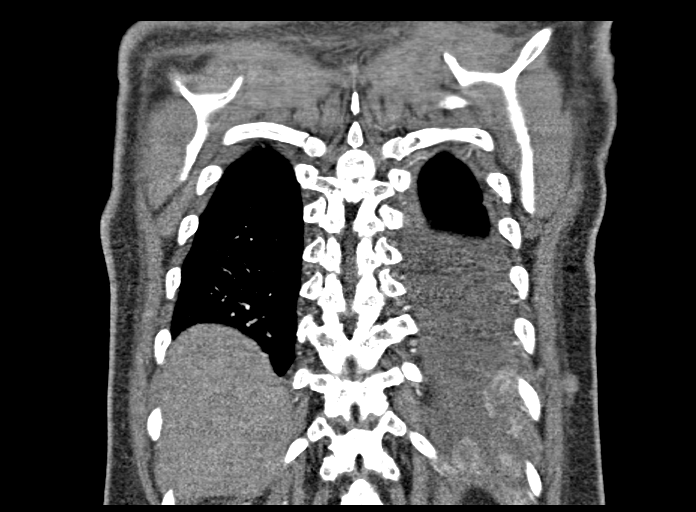

[15 of 46 positions shown; findings below may reference images not displayed]

RADIATION DOSE REDUCTION: This exam was performed according to the
departmental dose-optimization program which includes automated
exposure control, adjustment of the mA and/or kV according to
patient size and/or use of iterative reconstruction technique.

CONTRAST:  100mL OMNIPAQUE IOHEXOL 350 MG/ML SOLN
FINDINGS: CTA CHEST FINDINGS

Cardiovascular: Satisfactory opacification of the pulmonary arteries
to the segmental level. No evidence of pulmonary embolism. Normal
heart size. No significant pericardial effusion. The thoracic aorta
is normal in caliber. No atherosclerotic plaque of the thoracic
aorta. No coronary artery calcifications.

Mediastinum/Nodes: Interval decrease in size of conglomerative
mediastinal lymphadenopathy with as an example a subcarinal 2 x
cm lesion (from 5.9 x 3.1 cm). Interval decrease in hilar
lymphadenopathy with as an example a 1.8 cm (from 2.6 cm) right
hilar lymph node ([DATE]). No axillary lymph nodes. Thyroid gland,
trachea, and esophagus demonstrate no significant findings.

Lungs/Pleura: Interval decrease in size of innumerable pulmonary
metastases with as an example a 3.3 x 2.6 cm (from 3.7 x 3.6 cm)
left lower lobe ([DATE]) status as well as a left upper lobe 1.8 x
2.1 cm ([DATE]) right upper lobe (from 2.2 x 2.1 cm). No pulmonary
mass. Stable left pleural effusion. Redemonstration of interval
decrease in size of associated left pleural nodularity/mass ([DATE])
now measuring 8.2 x 1.3 cm (from 8.7 x 2.3 cm). No pneumothorax.

Musculoskeletal:

No chest wall abnormality.

No suspicious lytic or blastic osseous lesions. No acute displaced
fracture. Interval decrease in size of a left lateral fifth rib soft
tissue metastasis now measuring 6.3 x 4.1 cm (from 6.8 x 4.9 cm)
with associated persistent resorption/destruction of the lateral
fifth rib.

Review of the MIP images confirms the above findings.

CT ABDOMEN and PELVIS FINDINGS

Hepatobiliary: No focal liver abnormality. No gallstones,
gallbladder wall thickening, or pericholecystic fluid. No biliary
dilatation.

Pancreas: No focal lesion. Query slightly hazy contour of the
pancreatic head. Otherwise no surrounding inflammatory changes. No
main pancreatic ductal dilatation. No pancreatic pseudocyst
formation.

Spleen: Normal in size without focal abnormality.

Adrenals/Urinary Tract:

Grossly stable 6 x 2.4 cm (from 6 x 2.6 cm) left adrenal gland
metastatic lesion ([DATE]). The right adrenal gland is normal with no
nodularity.

Bilateral kidneys enhance symmetrically. Redemonstration of a
grossly stable in size heterogeneous 12 x 10 cm right renal lesion.
Redemonstration of several other subcentimeter hypodensities within
bilateral kidneys. Interval decrease in size of a heterogeneous
x 2.1 (from 2.2 x 2.3 cm) left renal lesion ([DATE]).

No hydronephrosis. No hydroureter.

The urinary bladder is unremarkable.

Stomach/Bowel: Stomach is within normal limits. No evidence of bowel
wall thickening or dilatation. The appendix not definitely
identified.

Vascular/Lymphatic: The inferior vena cava is patent. The hepatic,
portal, splenic, superior mesenteric veins are patent. The right
renal vein appears to be patent. The left renal vein appears to be
patent. No abdominal aorta or iliac aneurysm. Mild atherosclerotic
plaque of the aorta and its branches. No abdominal, pelvic, or
inguinal lymphadenopathy.

Reproductive: Prominent prostate.

Other: No intraperitoneal free fluid. No intraperitoneal free gas.
No organized fluid collection.

Musculoskeletal:

No abdominal wall hernia or abnormality.

No suspicious lytic or blastic osseous lesions. No acute displaced
fracture. Multilevel degenerative changes of the spine. Bilateral
sacroiliac joint degenerative changes.

Review of the MIP images confirms the above findings.
IMPRESSION: 1. No pulmonary embolus.
2. Query slightly hazy contour of the pancreatic head. Correlate
with lipase levels for acute pancreatitis.
3. Grossly stable 12 cm right renal malignancy.
4. Interval decrease in size of multiple known metastases including
bilateral pulmonary metastases, left pleural metastases, hilar and
mediastinal lymphadenopathy, as well as a left chest wall metastasis
involving the left fifth rib.
5. Grossly stable left adrenal gland metastasis.
6. Grossly stable to possibly slightly decreased in size 2.1 cm left
renal malignancy.

## 2021-08-28 MED ORDER — AMLODIPINE BESYLATE 5 MG PO TABS
5.0000 mg | ORAL_TABLET | Freq: Every day | ORAL | Status: DC
  Administered 2021-08-29: 5 mg via ORAL
  Filled 2021-08-28: qty 1

## 2021-08-28 MED ORDER — ACETAMINOPHEN 500 MG PO TABS
1000.0000 mg | ORAL_TABLET | Freq: Once | ORAL | Status: AC
  Administered 2021-08-28: 1000 mg via ORAL
  Filled 2021-08-28: qty 2

## 2021-08-28 MED ORDER — MORPHINE SULFATE (PF) 4 MG/ML IV SOLN
4.0000 mg | Freq: Once | INTRAVENOUS | Status: AC
  Administered 2021-08-28: 4 mg via INTRAVENOUS
  Filled 2021-08-28: qty 1

## 2021-08-28 MED ORDER — ACETAMINOPHEN 650 MG RE SUPP: 650.0000 mg | Freq: Four times a day (QID) | RECTAL | Status: DC | PRN

## 2021-08-28 MED ORDER — HEPARIN SODIUM (PORCINE) 5000 UNIT/ML IJ SOLN
5000.0000 [IU] | Freq: Three times a day (TID) | INTRAMUSCULAR | Status: DC
  Administered 2021-08-29 – 2021-09-02 (×13): 5000 [IU] via SUBCUTANEOUS
  Filled 2021-08-28 (×13): qty 1

## 2021-08-28 MED ORDER — LACTATED RINGERS IV BOLUS
1000.0000 mL | Freq: Once | INTRAVENOUS | Status: AC
  Administered 2021-08-28: 1000 mL via INTRAVENOUS

## 2021-08-28 MED ORDER — ONDANSETRON HCL 4 MG/2ML IJ SOLN
4.0000 mg | Freq: Once | INTRAMUSCULAR | Status: AC
  Administered 2021-08-28: 4 mg via INTRAVENOUS
  Filled 2021-08-28: qty 2

## 2021-08-28 MED ORDER — LACTATED RINGERS IV SOLN
INTRAVENOUS | Status: AC
  Administered 2021-08-28: 22:00:00 1000 mL via INTRAVENOUS

## 2021-08-28 MED ORDER — ONDANSETRON HCL 4 MG PO TABS: 4.0000 mg | ORAL_TABLET | Freq: Four times a day (QID) | ORAL | Status: DC | PRN

## 2021-08-28 MED ORDER — ONDANSETRON HCL 4 MG/2ML IJ SOLN
4.0000 mg | Freq: Four times a day (QID) | INTRAMUSCULAR | Status: DC | PRN
  Administered 2021-08-30 – 2021-09-01 (×4): 4 mg via INTRAVENOUS
  Filled 2021-08-28 (×4): qty 2

## 2021-08-28 MED ORDER — PANTOPRAZOLE SODIUM 40 MG PO TBEC
40.0000 mg | DELAYED_RELEASE_TABLET | Freq: Every day | ORAL | Status: DC
  Administered 2021-08-29 – 2021-09-02 (×5): 40 mg via ORAL
  Filled 2021-08-28 (×5): qty 1

## 2021-08-28 MED ORDER — ACETAMINOPHEN 325 MG PO TABS
650.0000 mg | ORAL_TABLET | Freq: Four times a day (QID) | ORAL | Status: DC | PRN
  Administered 2021-08-31: 650 mg via ORAL
  Filled 2021-08-28: qty 2

## 2021-08-28 MED ORDER — MORPHINE SULFATE (PF) 4 MG/ML IV SOLN
4.0000 mg | INTRAVENOUS | Status: DC | PRN
  Administered 2021-08-29 – 2021-09-01 (×25): 4 mg via INTRAVENOUS
  Filled 2021-08-28 (×26): qty 1

## 2021-08-28 MED ORDER — SODIUM CHLORIDE (PF) 0.9 % IJ SOLN
INTRAMUSCULAR | Status: AC
  Filled 2021-08-28: qty 50

## 2021-08-28 MED ORDER — IOHEXOL 350 MG/ML SOLN
100.0000 mL | Freq: Once | INTRAVENOUS | Status: AC | PRN
  Administered 2021-08-28: 100 mL via INTRAVENOUS

## 2021-08-28 NOTE — ED Provider Triage Note (Signed)
Emergency Medicine Provider Triage Evaluation Note  RANEY KOEPPEN , a 51 y.o. male  was evaluated in triage.  Pt complains of feeling well.  Patient with left-sided chest, rib, flank pain.  Patient has history of metastatic cancer.  He did not know he had a fever until he arrived today.  Has cough which is chronic.  Has had multiple episodes of emesis.  Recently discharged for pancreatitis.  Review of Systems  Positive: Left chest, flank pain Negative:   Physical Exam  BP 119/78 (BP Location: Left Arm)    Pulse (!) 120    Temp (!) 103 F (39.4 C) (Oral)    Resp 18    Ht 5\' 9"  (1.753 m)    Wt 73 kg    SpO2 100%    BMI 23.78 kg/m  Gen:   Awake, ill appearing Resp:  Normal effort, decreased breath sounds on left, tenderness left lateral ribs MSK:   Moves extremities without difficulty  ABD:  Soft, tenderness to left upper abdomen, left flank Other:    Medical Decision Making  Medically screening exam initiated at 6:50 PM.  Appropriate orders placed.  Waylan Boga Mazzoni was informed that the remainder of the evaluation will be completed by another provider, this initial triage assessment does not replace that evaluation, and the importance of remaining in the ED until their evaluation is complete.  Patient with known metastatic cancer.  Patient febrile, tachycardic.  Nursing made aware patient needs room in back.  Sepsis labs ordered.   Lorelai Huyser A, PA-C 08/28/21 [id number]

## 2021-08-28 NOTE — ED Provider Notes (Signed)
Dylan Good DEPT Provider Note  CSN: [id number] Arrival date & time: 08/28/21 1817  Chief Complaint(s) Nausea and Emesis  HPI Dylan Good is a 51 y.o. 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  Patient states that his symptoms began suddenly 48 hours ago and he now has a persistent cough.  Endorses mild crampy abdominal pain in the right and left lower quadrants as well.  Denies shortness of breath, diarrhea, headache or other systemic symptoms.  He arrives febrile and tachycardic.   Emesis Associated symptoms: abdominal pain, cough and fever    Past Medical History Past Medical History:  Diagnosis Date   Cancer, metastatic to lung ([place])    Hypertension 07/01/2021   Renal cell adenocarcinoma, left [facility])    Patient Active Problem List   Diagnosis Date Noted   Hypomagnesemia 08/13/2021   Intractable pain 07/01/2021   Hypertension 07/01/2021   Tobacco use 07/01/2021   Acute pancreatitis 07/01/2021   Pancreatitis 06/19/2021   Chest tube in place    Pleural effusion on left 01/31/2021   Brain metastases ([place]) 01/24/2021   Hypercalcemia 01/15/2021   Kidney cancer, primary, with metastasis from kidney to other site [facility]) 12/29/2020   Home Medication(s) Prior to Admission medications   Medication Sig Start Date End Date Taking? Authorizing Provider  amLODipine (NORVASC) 5 MG tablet TAKE 1 TABLET (5 MG TOTAL) BY MOUTH DAILY. 07/17/21   [name], MD  diclofenac Sodium (VOLTAREN) 1 % GEL Apply 2 g topically 4 (four) times daily. Patient not taking: Reported on 08/12/2021 06/21/21   Raiford Noble Latif, DO  docusate sodium (COLACE) 100 MG capsule Take 1 capsule (100 mg total) by mouth 2 (two) times daily. Patient not taking: Reported on 08/12/2021 06/21/21   Raiford Noble Latif, DO  nicotine (NICODERM CQ - DOSED IN MG/24 HOURS) 14  mg/24hr patch Place 1 patch (14 mg total) onto the skin daily. 08/15/21   Eugenie Filler, MD  ondansetron (ZOFRAN) 4 MG tablet Take 1 tablet (4 mg total) by mouth every 6 (six) hours as needed for nausea. 08/14/21   Eugenie Filler, MD  oxyCODONE-acetaminophen (PERCOCET/ROXICET) 5-325 MG tablet Take 1 tablet by mouth every 4 (four) hours as needed for moderate pain or severe pain. 07/06/21   Georgette Shell, MD  pantoprazole (PROTONIX) 40 MG tablet Take 1 tablet (40 mg total) by mouth daily. 08/14/21   Eugenie Filler, MD  polyethylene glycol (MIRALAX / GLYCOLAX) 17 g packet Take 17 g by mouth 2 (two) times daily. Patient not taking: Reported on 08/12/2021 07/06/21   Georgette Shell, MD  Past Surgical History Past Surgical History:  Procedure Laterality Date   APPENDECTOMY     BIOPSY  07/03/2021   Procedure: BIOPSY;  Surgeon: Ronnette Juniper, MD;  Location: [facility];  Service: Gastroenterology;;   CHEST TUBE INSERTION Left 02/06/2021   Procedure: INSERTION PLEURAL DRAINAGE CATHETER;  Surgeon: Garner Nash, DO;  Location: [place];  Service: Pulmonary;  Laterality: Left;   CHEST TUBE INSERTION N/A 04/04/2021   Procedure: removal of pleurex cath;  Surgeon: Garner Nash, DO;  Location: [place];  Service: Pulmonary;  Laterality: N/A;   ESOPHAGOGASTRODUODENOSCOPY (EGD) WITH PROPOFOL N/A 07/03/2021   Procedure: ESOPHAGOGASTRODUODENOSCOPY (EGD) WITH PROPOFOL;  Surgeon: Ronnette Juniper, MD;  Location: [facility];  Service: Gastroenterology;  Laterality: N/A;   Family History Family History  Problem Relation Age of Onset   Lupus Mother    Cancer Other     Social History Social History   Tobacco Use   Smoking status: Some Days    Types: Cigarettes   Smokeless tobacco: Never  Vaping Use   Vaping Use: Never used  Substance Use Topics    Alcohol use: Never   Drug use: Never   Allergies Patient has no known allergies.  Review of Systems Review of Systems  Constitutional:  Positive for fever.  Respiratory:  Positive for cough and chest tightness.   Cardiovascular:  Positive for chest pain.  Gastrointestinal:  Positive for abdominal pain and vomiting.   Physical Exam Vital Signs  I have reviewed the triage vital signs BP 119/78 (BP Location: Left Arm)    Pulse (!) 120    Temp (!) 103 F (39.4 C) (Oral)    Resp 18    Ht 5\' 9"  (1.753 m)    Wt 73 kg    SpO2 100%    BMI 23.78 kg/m   Physical Exam Vitals and nursing note reviewed.  Constitutional:      General: He is not in acute distress.    Appearance: He is well-developed. He is ill-appearing.  HENT:     Head: Normocephalic and atraumatic.  Eyes:     Conjunctiva/sclera: Conjunctivae normal.  Cardiovascular:     Rate and Rhythm: Regular rhythm. Tachycardia present.     Heart sounds: No murmur heard. Pulmonary:     Effort: Pulmonary effort is normal. No respiratory distress.     Breath sounds: Wheezing present.  Abdominal:     Palpations: Abdomen is soft.     Tenderness: There is abdominal tenderness.  Musculoskeletal:        General: No swelling.     Cervical back: Neck supple.  Skin:    General: Skin is warm and dry.     Capillary Refill: Capillary refill takes less than 2 seconds.  Neurological:     Mental Status: He is alert.  Psychiatric:        Mood and Affect: Mood normal.    ED Results and Treatments Labs (all labs ordered are listed, but only abnormal results are displayed) Labs Reviewed  CULTURE, BLOOD (ROUTINE X 2)  CULTURE, BLOOD (ROUTINE X 2)  RESP PANEL BY RT-PCR (FLU A&B, COVID) ARPGX2  CBC WITH DIFFERENTIAL/PLATELET  COMPREHENSIVE METABOLIC PANEL  LIPASE, BLOOD  LACTIC ACID, PLASMA  LACTIC ACID, PLASMA  URINALYSIS, ROUTINE W REFLEX MICROSCOPIC  PROTIME-INR  TROPONIN I (HIGH SENSITIVITY)  Radiology DG Chest 2 View  Result Date: 08/28/2021 CLINICAL DATA:  Cough and fever.  Metastatic renal carcinoma. EXAM: CHEST - 2 VIEW COMPARISON:  04/04/2021 FINDINGS: Multiple bilateral pulmonary nodules are best seen on prior CT. Lung nodules the right appear mildly improved from the prior study. Decreased nodularity and infiltrate in the left lower lobe. Large extrapleural mass and rib destruction involving the left fifth rib laterally. This is similar to the prior study. Small left pleural effusion with mild interval improvement. Right paratracheal adenopathy similar to the prior study. IMPRESSION: Negative for acute pneumonia Metastatic disease in the chest. Several lung nodules appear smaller. Improved aeration left lower lobe.  Decreased left effusion. Electronically Signed   By: Franchot Gallo M.D.   On: 08/28/2021 19:08    Pertinent labs & imaging results that were available during my care of the patient were reviewed by me and considered in my medical decision making (see MDM for details).  Medications Ordered in ED Medications  lactated ringers bolus 1,000 mL (has no administration in time range)                                                                                                                                     Procedures Procedures  (including critical care time)  Medical Decision Making / ED Course   This patient presents to the ED for concern of chest pain, cough, abdominal pain, fever, this involves an extensive number of treatment options, and is a complaint that carries with it a high risk of complications and morbidity.  The differential diagnosis includes sepsis, PE, pancreatitis, worsening metastatic disease  MDM: Patient seen emergency department for evaluation of chest pain, cough, abdominal pain and fever.  Physical exam reveals tenderness in the epigastrium, wheezing on the  left.  Laboratory evaluation with a leukopenia to 3.4, lipase elevated to 200, troponin negative, lactate 1.5, patient is COVID-positive which may explain his fever.  CT PE negative for PE and shows improving metastatic disease.  CT abdomen pelvis concerning for acute pancreatitis which fits in the setting of his elevated lipase, also redemonstrates his large renal mass.  Patient started on fluid resuscitation and given pain medicine.  Fever controlled with Tylenol.  Patient then admitted for pancreatitis.   Additional history obtained:  -External records from outside source obtained and reviewed including: Chart review including previous notes, labs, imaging, consultation notes   Lab Tests: -I ordered, reviewed, and interpreted labs.   The pertinent results include:   Labs Reviewed  CULTURE, BLOOD (ROUTINE X 2)  CULTURE, BLOOD (ROUTINE X 2)  RESP PANEL BY RT-PCR (FLU A&B, COVID) ARPGX2  CBC WITH DIFFERENTIAL/PLATELET  COMPREHENSIVE METABOLIC PANEL  LIPASE, BLOOD  LACTIC ACID, PLASMA  LACTIC ACID, PLASMA  URINALYSIS, ROUTINE W REFLEX MICROSCOPIC  PROTIME-INR  TROPONIN I (HIGH SENSITIVITY)      EKG   EKG Interpretation  Date/Time:  Tuesday August 28 2021  19:20:18 EST Ventricular Rate:  114 PR Interval:  139 QRS Duration: 81 QT Interval:  310 QTC Calculation: 427 R Axis:   71 Text Interpretation: Sinus tachycardia Confirmed by Danai Gotto ([id number]) on 08/28/2021 10:28:34 PM         Imaging Studies ordered: I ordered imaging studies including CXR, CTPE, CTAP I independently visualized and interpreted imaging. I agree with the radiologist interpretation   Medicines ordered and prescription drug management: Meds ordered this encounter  Medications   lactated ringers bolus 1,000 mL    -I have reviewed the patients home medicines and have made adjustments as needed  Critical interventions None  Cardiac Monitoring: The patient was maintained on a cardiac monitor.   I personally viewed and interpreted the cardiac monitored which showed an underlying rhythm of: Sinus tachycardia   Social Determinants of Health:  Factors impacting patients care include: none   Reevaluation: After the interventions noted above, I reevaluated the patient and found that they have :improved  Co morbidities that complicate the patient evaluation  Past Medical History:  Diagnosis Date   Cancer, metastatic to lung [facility])    Hypertension 07/01/2021   Renal cell adenocarcinoma, left ([place])       Dispostion: I considered admission for this patient, and due to work-up concerning for acute pancreatitis, patient will be admitted     Final Clinical Impression(s) / ED Diagnoses Final diagnoses:  None     @PCDICTATION @    Teressa Lower, MD 08/28/21 2229

## 2021-08-28 NOTE — Assessment & Plan Note (Addendum)
He's not taking oral chemo right now (with concern that it was causing his recurrent pancreatitis) Follows outpatient with [name]

## 2021-08-28 NOTE — H&P (Addendum)
History and Physical    Dylan Good BPZ:[id number] DOB: [birth year] DOA: 08/28/2021  DOS: the patient was seen and examined on 08/28/2021  PCP: System, Provider Not In   Patient coming from: Home  I have personally briefly reviewed patient's old medical records in [place]  CC: abd pain, cough HPI: 51 year old African-American male with a history of metastatic kidney cancer presents to the hospital today with a 1 day history of abdominal pain and cough.  He was recently discharged from the hospital earlier this month for a bout of pancreatitis.  Patient went home was feeling well.  Pain started last night.  He denies drinking any alcohol.  During his prior hospitalization, it was thought that his pancreatitis may have been from his oral chemo agent (Cometriq).  However the patient states that he has been out of this medication since he was discharged to the hospital and has not gotten a refill from his oncologist yet.  He denies any fever at home.  He was febrile when he was admitted to the hospital today.  Has had a slight cough.  No diarrhea.  On arrival, temp 103, heart rate 120, blood pressure 119/78, 100 100% room air saturations  Labs showed a lipase of 200  Chemistry sodium 133, bicarb 22, BUN of 8, creatinine 0.86  INR 1.1  Lactic acid 1.5  White count 3.4, hemoglobin 12.7, platelets 251  CT chest abdomen pelvis CTPA  Showed negative for pulmonary embolism.  He had some pancreatic head inflammation consistent with acute pancreatitis.  He has a large pulmonary mets, mediastinal mets, left pleural mets, chest wall mets.  He has a metastatic lesion to his left adrenal gland.  He has a 12 x 10 cm renal mass.  Due to patient's acute pancreatitis, [facility] hospitalist contacted for admission.   ED Course: CT abd shows pancreatitis, and renal cancer with numerous mets. Lipase 200  Review of Systems:  Review of Systems  Constitutional:  Positive for fever.   HENT: Negative.    Eyes: Negative.   Respiratory:  Positive for cough.   Cardiovascular: Negative.   Gastrointestinal:  Positive for abdominal pain.  Genitourinary: Negative.   Musculoskeletal: Negative.   Skin: Negative.   Neurological: Negative.   Endo/Heme/Allergies: Negative.   Psychiatric/Behavioral: Negative.    All other systems reviewed and are negative.  Past Medical History:  Diagnosis Date   Cancer, metastatic to lung [facility])    Hypertension 07/01/2021   Renal cell adenocarcinoma, left [facility])     Past Surgical History:  Procedure Laterality Date   APPENDECTOMY     BIOPSY  07/03/2021   Procedure: BIOPSY;  Surgeon: Ronnette Juniper, MD;  Location: [facility];  Service: Gastroenterology;;   CHEST TUBE INSERTION Left 02/06/2021   Procedure: INSERTION PLEURAL DRAINAGE CATHETER;  Surgeon: Garner Nash, DO;  Location: [place];  Service: Pulmonary;  Laterality: Left;   CHEST TUBE INSERTION N/A 04/04/2021   Procedure: removal of pleurex cath;  Surgeon: Garner Nash, DO;  Location: [place];  Service: Pulmonary;  Laterality: N/A;   ESOPHAGOGASTRODUODENOSCOPY (EGD) WITH PROPOFOL N/A 07/03/2021   Procedure: ESOPHAGOGASTRODUODENOSCOPY (EGD) WITH PROPOFOL;  Surgeon: Ronnette Juniper, MD;  Location: [facility];  Service: Gastroenterology;  Laterality: N/A;     reports that he has been smoking cigarettes. He has never used smokeless tobacco. He reports that he does not drink alcohol and does not use drugs.  No Known Allergies  Family History  Problem Relation Age of Onset  Lupus Mother    Cancer Other     Prior to Admission medications   Medication Sig Start Date End Date Taking? Authorizing Provider  amLODipine (NORVASC) 5 MG tablet TAKE 1 TABLET (5 MG TOTAL) BY MOUTH DAILY. 07/17/21  Yes [name], MD  nicotine (NICODERM CQ - DOSED IN MG/24 HOURS) 14 mg/24hr patch Place 1 patch (14 mg total) onto the skin daily. 08/15/21  Yes Eugenie Filler, MD  ondansetron  (ZOFRAN) 4 MG tablet Take 1 tablet (4 mg total) by mouth every 6 (six) hours as needed for nausea. 08/14/21  Yes Eugenie Filler, MD  pantoprazole (PROTONIX) 40 MG tablet Take 1 tablet (40 mg total) by mouth daily. 08/14/21  Yes Eugenie Filler, MD  diclofenac Sodium (VOLTAREN) 1 % GEL Apply 2 g topically 4 (four) times daily. Patient not taking: Reported on 08/28/2021 06/21/21   Raiford Noble Latif, DO  docusate sodium (COLACE) 100 MG capsule Take 1 capsule (100 mg total) by mouth 2 (two) times daily. Patient not taking: Reported on 08/12/2021 06/21/21   Raiford Noble Latif, DO  oxyCODONE-acetaminophen (PERCOCET/ROXICET) 5-325 MG tablet Take 1 tablet by mouth every 4 (four) hours as needed for moderate pain or severe pain. Patient not taking: Reported on 08/28/2021 07/06/21   Georgette Shell, MD  polyethylene glycol (MIRALAX / GLYCOLAX) 17 g packet Take 17 g by mouth 2 (two) times daily. Patient not taking: Reported on 08/12/2021 07/06/21   Georgette Shell, MD    Physical Exam: Vitals:   08/28/21 1833 08/28/21 1930 08/28/21 1945 08/28/21 2018  BP: 119/78 119/76 138/78 121/72  Pulse: (!) 120 (!) 105 95 91  Resp: 18 16 12 17   Temp: (!) 103 F (39.4 C)     TempSrc: Oral     SpO2: 100% 100% 98% 98%  Weight: 73 kg     Height: 5\' 9"  (1.753 m)       Physical Exam Vitals and nursing note reviewed.  Constitutional:      General: He is not in acute distress.    Appearance: Normal appearance. He is normal weight. He is not ill-appearing, toxic-appearing or diaphoretic.  HENT:     Head: Normocephalic and atraumatic.     Nose: Nose normal. No rhinorrhea.  Cardiovascular:     Rate and Rhythm: Normal rate and regular rhythm.     Pulses: Normal pulses.  Pulmonary:     Breath sounds: Examination of the right-lower field reveals decreased breath sounds. Decreased breath sounds present. No wheezing or rhonchi.  Abdominal:     General: Abdomen is flat.     Palpations: There is mass.      Tenderness: There is abdominal tenderness in the epigastric area.     Comments: Bilateral RUQ/LUQ soft tissue masses. Mildly tender  Musculoskeletal:     Right lower leg: No edema.     Left lower leg: No edema.  Skin:    General: Skin is warm and dry.     Capillary Refill: Capillary refill takes less than 2 seconds.  Neurological:     General: No focal deficit present.     Mental Status: He is alert and oriented to person, place, and time.     Labs on Admission: I have personally reviewed following labs and imaging studies  CBC: Recent Labs  Lab 08/28/21 1930  WBC 3.4*  NEUTROABS 2.2  HGB 12.7*  HCT 40.0  MCV 77.8*  PLT 157   Basic Metabolic Panel: Recent Labs  Lab 08/28/21 1930  NA 133*  K 3.5  CL 103  CO2 22  GLUCOSE 97  BUN 8  CREATININE 0.86  CALCIUM 8.7*   GFR: Estimated Creatinine Clearance: 105 mL/min (by C-G formula based on SCr of 0.86 mg/dL). Liver Function Tests: Recent Labs  Lab 08/28/21 1930  AST 16  ALT 12  ALKPHOS 71  BILITOT 1.0  PROT 7.8  ALBUMIN 3.8   Recent Labs  Lab 08/28/21 1930  LIPASE 200*   No results for input(s): AMMONIA in the last 168 hours. Coagulation Profile: Recent Labs  Lab 08/28/21 1930  INR 1.1   Cardiac Enzymes: No results for input(s): CKTOTAL, CKMB, CKMBINDEX, TROPONINI in the last 168 hours. BNP (last 3 results) No results for input(s): PROBNP in the last 8760 hours. HbA1C: No results for input(s): HGBA1C in the last 72 hours. CBG: No results for input(s): GLUCAP in the last 168 hours. Lipid Profile: No results for input(s): CHOL, HDL, LDLCALC, TRIG, CHOLHDL, LDLDIRECT in the last 72 hours. Thyroid Function Tests: No results for input(s): TSH, T4TOTAL, FREET4, T3FREE, THYROIDAB in the last 72 hours. Anemia Panel: No results for input(s): VITAMINB12, FOLATE, FERRITIN, TIBC, IRON, RETICCTPCT in the last 72 hours. Urine analysis:    Component Value Date/Time   COLORURINE YELLOW (A) 08/12/2021 1830    APPEARANCEUR HAZY (A) 08/12/2021 1830   LABSPEC 1.010 08/12/2021 1830   PHURINE 6.0 08/12/2021 1830   GLUCOSEU NEGATIVE 08/12/2021 1830   HGBUR LARGE (A) 08/12/2021 1830   BILIRUBINUR NEGATIVE 08/12/2021 1830   KETONESUR NEGATIVE 08/12/2021 1830   PROTEINUR NEGATIVE 08/12/2021 1830   NITRITE NEGATIVE 08/12/2021 1830   LEUKOCYTESUR SMALL (A) 08/12/2021 1830    Radiological Exams on Admission: I have personally reviewed images DG Chest 2 View  Result Date: 08/28/2021 CLINICAL DATA:  Cough and fever.  Metastatic renal carcinoma. EXAM: CHEST - 2 VIEW COMPARISON:  04/04/2021 FINDINGS: Multiple bilateral pulmonary nodules are best seen on prior CT. Lung nodules the right appear mildly improved from the prior study. Decreased nodularity and infiltrate in the left lower lobe. Large extrapleural mass and rib destruction involving the left fifth rib laterally. This is similar to the prior study. Small left pleural effusion with mild interval improvement. Right paratracheal adenopathy similar to the prior study. IMPRESSION: Negative for acute pneumonia Metastatic disease in the chest. Several lung nodules appear smaller. Improved aeration left lower lobe.  Decreased left effusion. Electronically Signed   By: Franchot Gallo M.D.   On: 08/28/2021 19:08   CT Angio Chest PE W and/or Wo Contrast  Result Date: 08/28/2021 CLINICAL DATA:  Pulmonary embolism (PE) suspected, high prob; Sepsis. Patient with left-sided chest, rib, flank pain. Patient has history of metastatic cancer. He did not know he had a fever until he arrived today. Has cough which is chronic. Has had multiple episodes of emesis. Recently discharged for pancreatitis. Metastatic urologic cancer. EXAM: CT ANGIOGRAPHY CHEST CT ABDOMEN AND PELVIS WITH CONTRAST TECHNIQUE: Multidetector CT imaging of the chest was performed using the standard protocol during bolus administration of intravenous contrast. Multiplanar CT image reconstructions and MIPs were  obtained to evaluate the vascular anatomy. Multidetector CT imaging of the abdomen and pelvis was performed using the standard protocol during bolus administration of intravenous contrast. RADIATION DOSE REDUCTION: This exam was performed according to the departmental dose-optimization program which includes automated exposure control, adjustment of the mA and/or kV according to patient size and/or use of iterative reconstruction technique. CONTRAST:  16mL OMNIPAQUE IOHEXOL 350  MG/ML SOLN COMPARISON:  CT abdomen pelvis 07/01/2021, CT chest abdomen pelvis 04/16/2021. FINDINGS: CTA CHEST FINDINGS Cardiovascular: Satisfactory opacification of the pulmonary arteries to the segmental level. No evidence of pulmonary embolism. Normal heart size. No significant pericardial effusion. The thoracic aorta is normal in caliber. No atherosclerotic plaque of the thoracic aorta. No coronary artery calcifications. Mediastinum/Nodes: Interval decrease in size of conglomerative mediastinal lymphadenopathy with as an example a subcarinal 2 x 5.5 cm lesion (from 5.9 x 3.1 cm). Interval decrease in hilar lymphadenopathy with as an example a 1.8 cm (from 2.6 cm) right hilar lymph node (6:38). No axillary lymph nodes. Thyroid gland, trachea, and esophagus demonstrate no significant findings. Lungs/Pleura: Interval decrease in size of innumerable pulmonary metastases with as an example a 3.3 x 2.6 cm (from 3.7 x 3.6 cm) left lower lobe (12:109) status as well as a left upper lobe 1.8 x 2.1 cm (12:71) right upper lobe (from 2.2 x 2.1 cm). No pulmonary mass. Stable left pleural effusion. Redemonstration of interval decrease in size of associated left pleural nodularity/mass (6:81) now measuring 8.2 x 1.3 cm (from 8.7 x 2.3 cm). No pneumothorax. Musculoskeletal: No chest wall abnormality. No suspicious lytic or blastic osseous lesions. No acute displaced fracture. Interval decrease in size of a left lateral fifth rib soft tissue metastasis  now measuring 6.3 x 4.1 cm (from 6.8 x 4.9 cm) with associated persistent resorption/destruction of the lateral fifth rib. Review of the MIP images confirms the above findings. CT ABDOMEN and PELVIS FINDINGS Hepatobiliary: No focal liver abnormality. No gallstones, gallbladder wall thickening, or pericholecystic fluid. No biliary dilatation. Pancreas: No focal lesion. Query slightly hazy contour of the pancreatic head. Otherwise no surrounding inflammatory changes. No main pancreatic ductal dilatation. No pancreatic pseudocyst formation. Spleen: Normal in size without focal abnormality. Adrenals/Urinary Tract: Grossly stable 6 x 2.4 cm (from 6 x 2.6 cm) left adrenal gland metastatic lesion (2:27). The right adrenal gland is normal with no nodularity. Bilateral kidneys enhance symmetrically. Redemonstration of a grossly stable in size heterogeneous 12 x 10 cm right renal lesion. Redemonstration of several other subcentimeter hypodensities within bilateral kidneys. Interval decrease in size of a heterogeneous 2.1 x 2.1 (from 2.2 x 2.3 cm) left renal lesion (2:37). No hydronephrosis. No hydroureter. The urinary bladder is unremarkable. Stomach/Bowel: Stomach is within normal limits. No evidence of bowel wall thickening or dilatation. The appendix not definitely identified. Vascular/Lymphatic: The inferior vena cava is patent. The hepatic, portal, splenic, superior mesenteric veins are patent. The right renal vein appears to be patent. The left renal vein appears to be patent. No abdominal aorta or iliac aneurysm. Mild atherosclerotic plaque of the aorta and its branches. No abdominal, pelvic, or inguinal lymphadenopathy. Reproductive: Prominent prostate. Other: No intraperitoneal free fluid. No intraperitoneal free gas. No organized fluid collection. Musculoskeletal: No abdominal wall hernia or abnormality. No suspicious lytic or blastic osseous lesions. No acute displaced fracture. Multilevel degenerative changes of  the spine. Bilateral sacroiliac joint degenerative changes. Review of the MIP images confirms the above findings. IMPRESSION: 1. No pulmonary embolus. 2. Query slightly hazy contour of the pancreatic head. Correlate with lipase levels for acute pancreatitis. 3. Grossly stable 12 cm right renal malignancy. 4. Interval decrease in size of multiple known metastases including bilateral pulmonary metastases, left pleural metastases, hilar and mediastinal lymphadenopathy, as well as a left chest wall metastasis involving the left fifth rib. 5. Grossly stable left adrenal gland metastasis. 6. Grossly stable to possibly slightly decreased in size 2.1 cm  left renal malignancy. Electronically Signed   By: Iven Finn M.D.   On: 08/28/2021 20:53   CT ABDOMEN PELVIS W CONTRAST  Result Date: 08/28/2021 CLINICAL DATA:  Pulmonary embolism (PE) suspected, high prob; Sepsis. Patient with left-sided chest, rib, flank pain. Patient has history of metastatic cancer. He did not know he had a fever until he arrived today. Has cough which is chronic. Has had multiple episodes of emesis. Recently discharged for pancreatitis. Metastatic urologic cancer. EXAM: CT ANGIOGRAPHY CHEST CT ABDOMEN AND PELVIS WITH CONTRAST TECHNIQUE: Multidetector CT imaging of the chest was performed using the standard protocol during bolus administration of intravenous contrast. Multiplanar CT image reconstructions and MIPs were obtained to evaluate the vascular anatomy. Multidetector CT imaging of the abdomen and pelvis was performed using the standard protocol during bolus administration of intravenous contrast. RADIATION DOSE REDUCTION: This exam was performed according to the departmental dose-optimization program which includes automated exposure control, adjustment of the mA and/or kV according to patient size and/or use of iterative reconstruction technique. CONTRAST:  145mL OMNIPAQUE IOHEXOL 350 MG/ML SOLN COMPARISON:  CT abdomen pelvis  07/01/2021, CT chest abdomen pelvis 04/16/2021. FINDINGS: CTA CHEST FINDINGS Cardiovascular: Satisfactory opacification of the pulmonary arteries to the segmental level. No evidence of pulmonary embolism. Normal heart size. No significant pericardial effusion. The thoracic aorta is normal in caliber. No atherosclerotic plaque of the thoracic aorta. No coronary artery calcifications. Mediastinum/Nodes: Interval decrease in size of conglomerative mediastinal lymphadenopathy with as an example a subcarinal 2 x 5.5 cm lesion (from 5.9 x 3.1 cm). Interval decrease in hilar lymphadenopathy with as an example a 1.8 cm (from 2.6 cm) right hilar lymph node (6:38). No axillary lymph nodes. Thyroid gland, trachea, and esophagus demonstrate no significant findings. Lungs/Pleura: Interval decrease in size of innumerable pulmonary metastases with as an example a 3.3 x 2.6 cm (from 3.7 x 3.6 cm) left lower lobe (12:109) status as well as a left upper lobe 1.8 x 2.1 cm (12:71) right upper lobe (from 2.2 x 2.1 cm). No pulmonary mass. Stable left pleural effusion. Redemonstration of interval decrease in size of associated left pleural nodularity/mass (6:81) now measuring 8.2 x 1.3 cm (from 8.7 x 2.3 cm). No pneumothorax. Musculoskeletal: No chest wall abnormality. No suspicious lytic or blastic osseous lesions. No acute displaced fracture. Interval decrease in size of a left lateral fifth rib soft tissue metastasis now measuring 6.3 x 4.1 cm (from 6.8 x 4.9 cm) with associated persistent resorption/destruction of the lateral fifth rib. Review of the MIP images confirms the above findings. CT ABDOMEN and PELVIS FINDINGS Hepatobiliary: No focal liver abnormality. No gallstones, gallbladder wall thickening, or pericholecystic fluid. No biliary dilatation. Pancreas: No focal lesion. Query slightly hazy contour of the pancreatic head. Otherwise no surrounding inflammatory changes. No main pancreatic ductal dilatation. No pancreatic  pseudocyst formation. Spleen: Normal in size without focal abnormality. Adrenals/Urinary Tract: Grossly stable 6 x 2.4 cm (from 6 x 2.6 cm) left adrenal gland metastatic lesion (2:27). The right adrenal gland is normal with no nodularity. Bilateral kidneys enhance symmetrically. Redemonstration of a grossly stable in size heterogeneous 12 x 10 cm right renal lesion. Redemonstration of several other subcentimeter hypodensities within bilateral kidneys. Interval decrease in size of a heterogeneous 2.1 x 2.1 (from 2.2 x 2.3 cm) left renal lesion (2:37). No hydronephrosis. No hydroureter. The urinary bladder is unremarkable. Stomach/Bowel: Stomach is within normal limits. No evidence of bowel wall thickening or dilatation. The appendix not definitely identified. Vascular/Lymphatic: The inferior vena  cava is patent. The hepatic, portal, splenic, superior mesenteric veins are patent. The right renal vein appears to be patent. The left renal vein appears to be patent. No abdominal aorta or iliac aneurysm. Mild atherosclerotic plaque of the aorta and its branches. No abdominal, pelvic, or inguinal lymphadenopathy. Reproductive: Prominent prostate. Other: No intraperitoneal free fluid. No intraperitoneal free gas. No organized fluid collection. Musculoskeletal: No abdominal wall hernia or abnormality. No suspicious lytic or blastic osseous lesions. No acute displaced fracture. Multilevel degenerative changes of the spine. Bilateral sacroiliac joint degenerative changes. Review of the MIP images confirms the above findings. IMPRESSION: 1. No pulmonary embolus. 2. Query slightly hazy contour of the pancreatic head. Correlate with lipase levels for acute pancreatitis. 3. Grossly stable 12 cm right renal malignancy. 4. Interval decrease in size of multiple known metastases including bilateral pulmonary metastases, left pleural metastases, hilar and mediastinal lymphadenopathy, as well as a left chest wall metastasis involving  the left fifth rib. 5. Grossly stable left adrenal gland metastasis. 6. Grossly stable to possibly slightly decreased in size 2.1 cm left renal malignancy. Electronically Signed   By: Iven Finn M.D.   On: 08/28/2021 20:53    EKG: I have personally reviewed EKG: sinus tachycardia   Assessment/Plan Principal Problem:   Acute pancreatitis Active Problems:   Kidney cancer, primary, with metastasis from kidney to other site [facility])   Brain metastases (HCC)   Pleural effusion on left   Hypertension   Lung metastases (HCC)    Assessment and Plan: * Acute pancreatitis- (present on admission) Admit to observation. NPO. IVF. Repeat lipase in AM. Prn IV morphine for pain. I think his fever is from his cancer and pancreatitis. Pt denies any alcohol. Check IgG4  Lung metastases ([place])- (present on admission) Stable.  Hypertension- (present on admission) Continue norvasc.  Pleural effusion on left- (present on admission) Stable.  Brain metastases ([place])- (present on admission) Stable.  Kidney cancer, primary, with metastasis from kidney to other site [facility])- (present on admission) Metastatic. Pt states he is out of his oral chemo. May benefit from oncology seeing him during his hospital stay.    DVT prophylaxis: SQ Heparin Code Status: Full Code Family Communication: no family at bedside  Disposition Plan: return home  Consults called: none  Admission status: Observation, Med-Surg   Kristopher Oppenheim, DO [facility] 08/28/2021, 9:55 PM

## 2021-08-28 NOTE — Assessment & Plan Note (Addendum)
Noted  

## 2021-08-28 NOTE — ED Triage Notes (Signed)
Pt arrived via POV, c/o n/v and left sided pain x2 days.

## 2021-08-28 NOTE — ED Notes (Signed)
Pt mild distress noted in bed, a/ox4. Pt c/o left axillary pain radiating to left flank and diffuse ABD pain, with n/v and a cough x 2 days. Pain increases with palpation. +Febrile and tachy, on chemo for kidney CA.

## 2021-08-28 NOTE — Assessment & Plan Note (Addendum)
noted 

## 2021-08-28 NOTE — Subjective & Objective (Signed)
CC: abd pain, cough HPI: 51 year old African-American male with a history of metastatic kidney cancer presents to the hospital today with a 1 day history of abdominal pain and cough.  He was recently discharged from the hospital earlier this month for a bout of pancreatitis.  Patient went home was feeling well.  Pain started last night.  He denies drinking any alcohol.  During his prior hospitalization, it was thought that his pancreatitis may have been from his oral chemo agent (Cometriq).  However the patient states that he has been out of this medication since he was discharged to the hospital and has not gotten a refill from his oncologist yet.  He denies any fever at home.  He was febrile when he was admitted to the hospital today.  Has had a slight cough.  No diarrhea.  On arrival, temp 103, heart rate 120, blood pressure 119/78, 100 100% room air saturations  Labs showed a lipase of 200  Chemistry sodium 133, bicarb 22, BUN of 8, creatinine 0.86  INR 1.1  Lactic acid 1.5  White count 3.4, hemoglobin 12.7, platelets 251  CT chest abdomen pelvis CTPA  Showed negative for pulmonary embolism.  He had some pancreatic head inflammation consistent with acute pancreatitis.  He has a large pulmonary mets, mediastinal mets, left pleural mets, chest wall mets.  He has a metastatic lesion to his left adrenal gland.  He has a 12 x 10 cm renal mass.  Due to patient's acute pancreatitis, [facility] hospitalist contacted for admission.

## 2021-08-28 NOTE — Assessment & Plan Note (Addendum)
Lipase peaked to 200 CT with hazy contour of pancreatic head, concerning for pancreatitis He's had multiple recent admissions for pancreatitis At last discharge, he was instructed to hold his oral chemo with concern that chemo meds maybe contributing (cabometyx), but he's had recurrence despite that RUQ [place] without cholelithiasis Triglycerides 47 IgG4 wnl Denies etoh use I don't see obvious contributing meds Tolerating diet, will discharge with plan for outpatient follow up.  Follow up with GI given recurrent gastroenterology.  Gi c/s given recurrent pancreatitis, appreciate recommendations

## 2021-08-28 NOTE — Assessment & Plan Note (Addendum)
Resume norvasc at discharge

## 2021-08-28 NOTE — ED Notes (Signed)
Patient transported to CT 

## 2021-08-29 ENCOUNTER — Observation Stay (HOSPITAL_COMMUNITY): Payer: Medicaid Other

## 2021-08-29 ENCOUNTER — Other Ambulatory Visit: Payer: Self-pay

## 2021-08-29 DIAGNOSIS — C7802 Secondary malignant neoplasm of left lung: Secondary | ICD-10-CM | POA: Diagnosis present

## 2021-08-29 DIAGNOSIS — U071 COVID-19: Secondary | ICD-10-CM | POA: Diagnosis present

## 2021-08-29 DIAGNOSIS — F17210 Nicotine dependence, cigarettes, uncomplicated: Secondary | ICD-10-CM | POA: Diagnosis present

## 2021-08-29 DIAGNOSIS — K861 Other chronic pancreatitis: Secondary | ICD-10-CM | POA: Diagnosis present

## 2021-08-29 DIAGNOSIS — Z79899 Other long term (current) drug therapy: Secondary | ICD-10-CM | POA: Diagnosis not present

## 2021-08-29 DIAGNOSIS — C641 Malignant neoplasm of right kidney, except renal pelvis: Secondary | ICD-10-CM | POA: Diagnosis present

## 2021-08-29 DIAGNOSIS — C7972 Secondary malignant neoplasm of left adrenal gland: Secondary | ICD-10-CM | POA: Diagnosis present

## 2021-08-29 DIAGNOSIS — D709 Neutropenia, unspecified: Secondary | ICD-10-CM | POA: Diagnosis present

## 2021-08-29 DIAGNOSIS — C7801 Secondary malignant neoplasm of right lung: Secondary | ICD-10-CM | POA: Diagnosis present

## 2021-08-29 DIAGNOSIS — C782 Secondary malignant neoplasm of pleura: Secondary | ICD-10-CM | POA: Diagnosis present

## 2021-08-29 DIAGNOSIS — I10 Essential (primary) hypertension: Secondary | ICD-10-CM | POA: Diagnosis present

## 2021-08-29 DIAGNOSIS — J90 Pleural effusion, not elsewhere classified: Secondary | ICD-10-CM | POA: Diagnosis present

## 2021-08-29 DIAGNOSIS — K859 Acute pancreatitis without necrosis or infection, unspecified: Secondary | ICD-10-CM | POA: Diagnosis present

## 2021-08-29 DIAGNOSIS — C7931 Secondary malignant neoplasm of brain: Secondary | ICD-10-CM | POA: Diagnosis present

## 2021-08-29 DIAGNOSIS — C781 Secondary malignant neoplasm of mediastinum: Secondary | ICD-10-CM | POA: Diagnosis present

## 2021-08-29 LAB — CBC WITH DIFFERENTIAL/PLATELET
Abs Immature Granulocytes: 0 10*3/uL (ref 0.00–0.07)
Basophils Absolute: 0 10*3/uL (ref 0.0–0.1)
Basophils Relative: 1 %
Eosinophils Absolute: 0 10*3/uL (ref 0.0–0.5)
Eosinophils Relative: 1 %
HCT: 37.7 % — ABNORMAL LOW (ref 39.0–52.0)
Hemoglobin: 11.7 g/dL — ABNORMAL LOW (ref 13.0–17.0)
Immature Granulocytes: 0 %
Lymphocytes Relative: 33 %
Lymphs Abs: 0.9 10*3/uL (ref 0.7–4.0)
MCH: 24.6 pg — ABNORMAL LOW (ref 26.0–34.0)
MCHC: 31 g/dL (ref 30.0–36.0)
MCV: 79.4 fL — ABNORMAL LOW (ref 80.0–100.0)
Monocytes Absolute: 0.5 10*3/uL (ref 0.1–1.0)
Monocytes Relative: 19 %
Neutro Abs: 1.2 10*3/uL — ABNORMAL LOW (ref 1.7–7.7)
Neutrophils Relative %: 46 %
Platelets: 212 10*3/uL (ref 150–400)
RBC: 4.75 MIL/uL (ref 4.22–5.81)
RDW: 16.7 % — ABNORMAL HIGH (ref 11.5–15.5)
WBC: 2.6 10*3/uL — ABNORMAL LOW (ref 4.0–10.5)
nRBC: 0 % (ref 0.0–0.2)

## 2021-08-29 LAB — URINALYSIS, ROUTINE W REFLEX MICROSCOPIC
Bacteria, UA: NONE SEEN
Bilirubin Urine: NEGATIVE
Glucose, UA: NEGATIVE mg/dL
Ketones, ur: 20 mg/dL — AB
Nitrite: NEGATIVE
Protein, ur: NEGATIVE mg/dL
RBC / HPF: >50 RBC/hpf — ABNORMAL HIGH (ref 0–5)
Specific Gravity, Urine: 1.043 — ABNORMAL HIGH (ref 1.005–1.030)
pH: 6 (ref 5.0–8.0)

## 2021-08-29 LAB — LIPID PANEL
Cholesterol: 151 mg/dL (ref 0–200)
HDL: 25 mg/dL — ABNORMAL LOW (ref >40)
LDL Cholesterol: 117 mg/dL — ABNORMAL HIGH (ref 0–99)
Total CHOL/HDL Ratio: 6 RATIO
Triglycerides: 47 mg/dL (ref <150)
VLDL: 9 mg/dL (ref 0–40)

## 2021-08-29 LAB — COMPREHENSIVE METABOLIC PANEL
ALT: 11 U/L (ref 0–44)
AST: 13 U/L — ABNORMAL LOW (ref 15–41)
Albumin: 3.2 g/dL — ABNORMAL LOW (ref 3.5–5.0)
Alkaline Phosphatase: 62 U/L (ref 38–126)
Anion gap: 6 (ref 5–15)
BUN: 6 mg/dL (ref 6–20)
CO2: 22 mmol/L (ref 22–32)
Calcium: 8 mg/dL — ABNORMAL LOW (ref 8.9–10.3)
Chloride: 105 mmol/L (ref 98–111)
Creatinine, Ser: 0.72 mg/dL (ref 0.61–1.24)
GFR, Estimated: >60 mL/min (ref >60)
Glucose, Bld: 83 mg/dL (ref 70–99)
Potassium: 3.6 mmol/L (ref 3.5–5.1)
Sodium: 133 mmol/L — ABNORMAL LOW (ref 135–145)
Total Bilirubin: 0.8 mg/dL (ref 0.3–1.2)
Total Protein: 6.5 g/dL (ref 6.5–8.1)

## 2021-08-29 LAB — LIPASE, BLOOD: Lipase: 119 U/L — ABNORMAL HIGH (ref 11–51)

## 2021-08-29 LAB — LACTIC ACID, PLASMA: Lactic Acid, Venous: 1.1 mmol/L (ref 0.5–1.9)

## 2021-08-29 LAB — TROPONIN I (HIGH SENSITIVITY): Troponin I (High Sensitivity): 2 ng/L (ref <18)

## 2021-08-29 IMAGING — US US ABDOMEN LIMITED
1 series · 14 of 25 positions shown · non-contrast
Comparison: CT [DATE]

CLINICAL DATA: Pancreatitis

EXAM:
ULTRASOUND ABDOMEN LIMITED RIGHT UPPER QUADRANT

[Series 1: us abdomen limited · 14 of 37 slices shown]
[im 1/37]
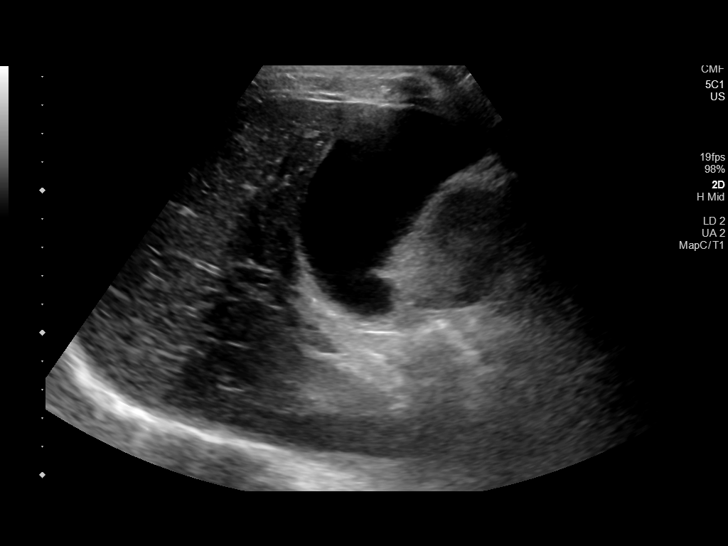
[im 4/37]
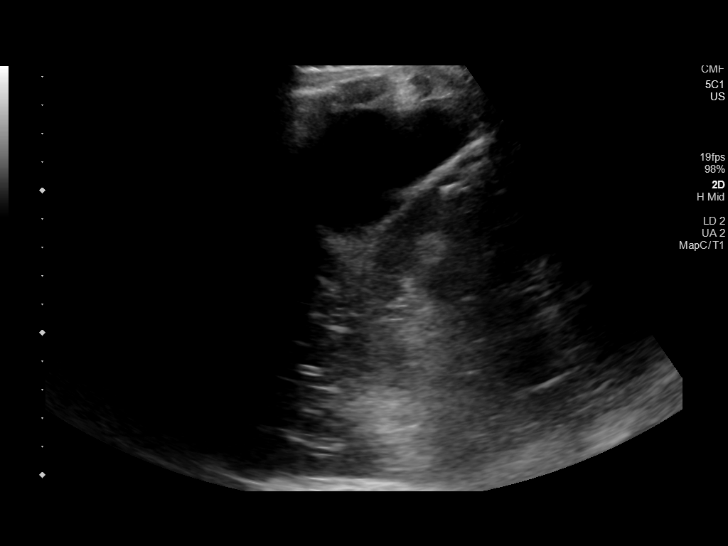
[im 7/37]
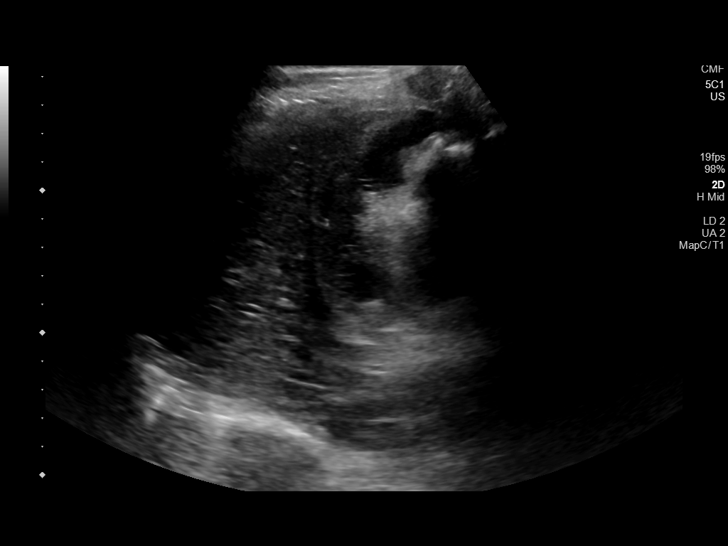
[im 10/37]
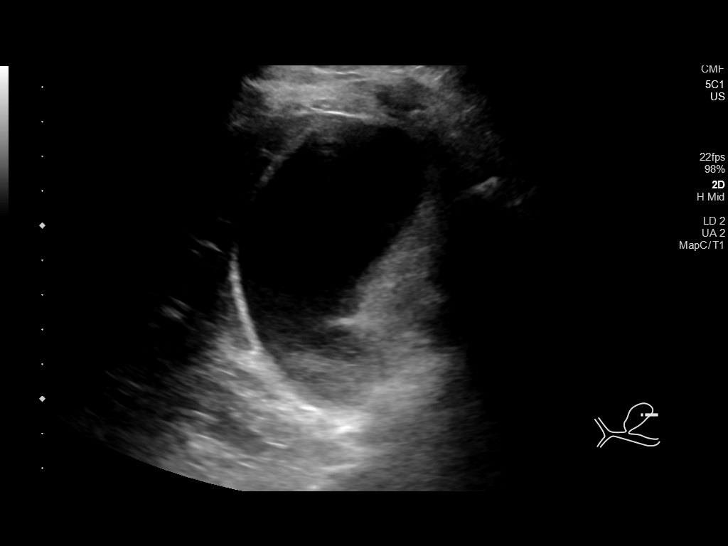
[im 13/37]
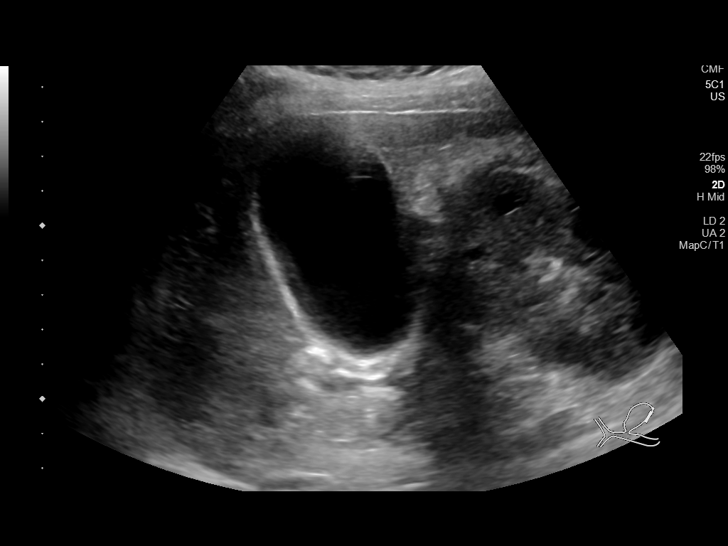
[im 14/37]
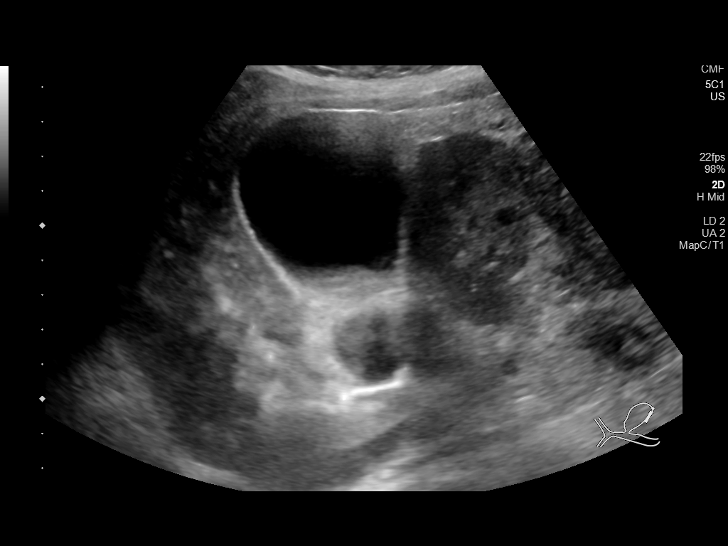
[im 17/37]
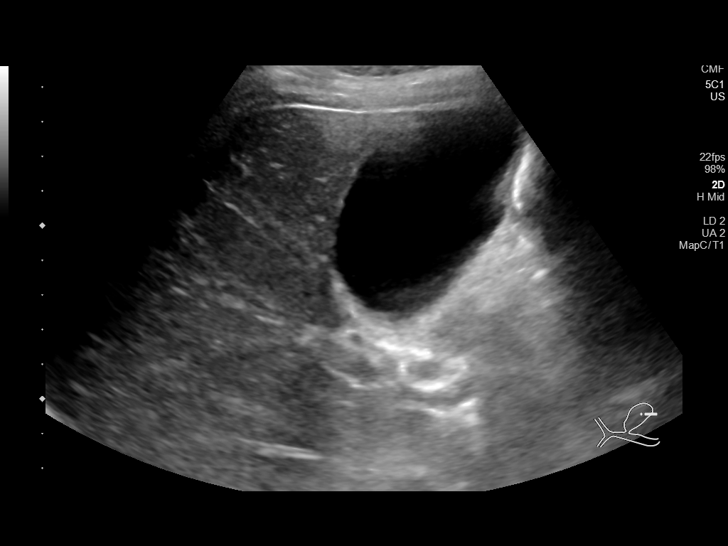
[im 20/37]
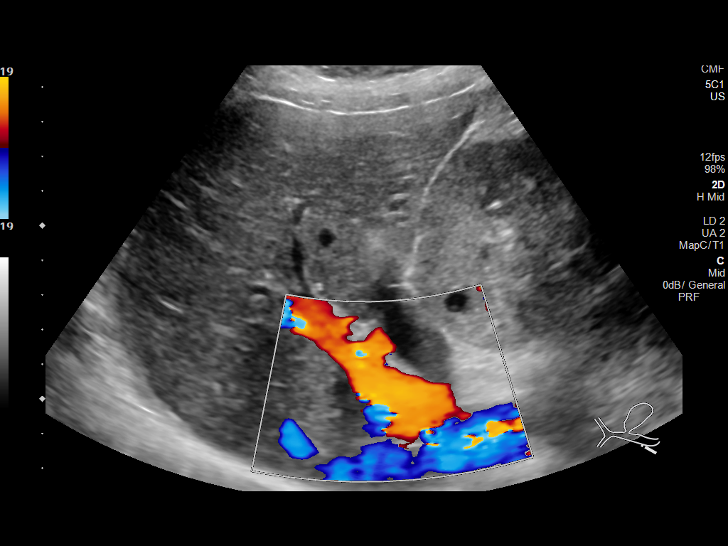
[im 23/37]
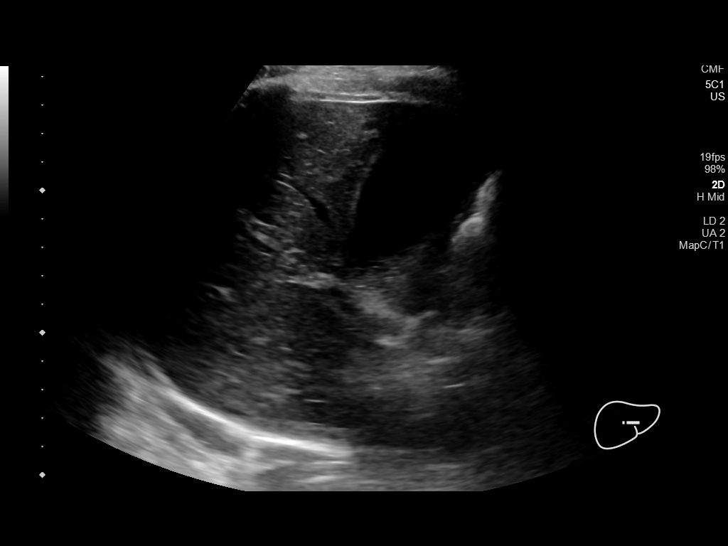
[im 25/37]
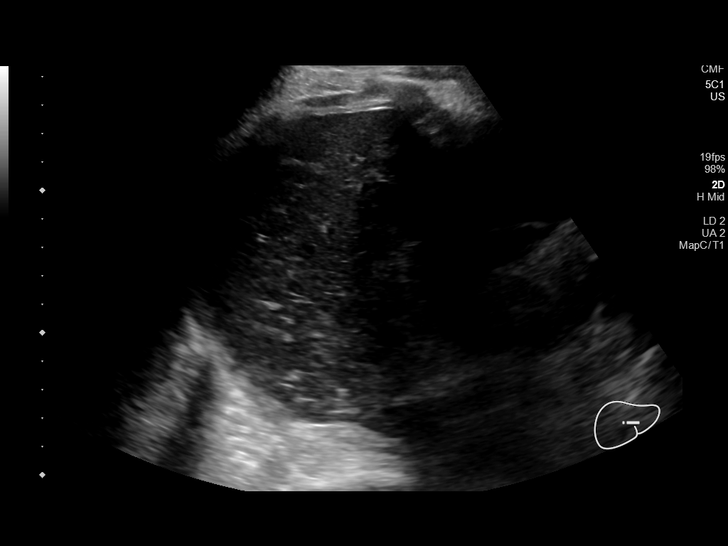
[im 28/37]
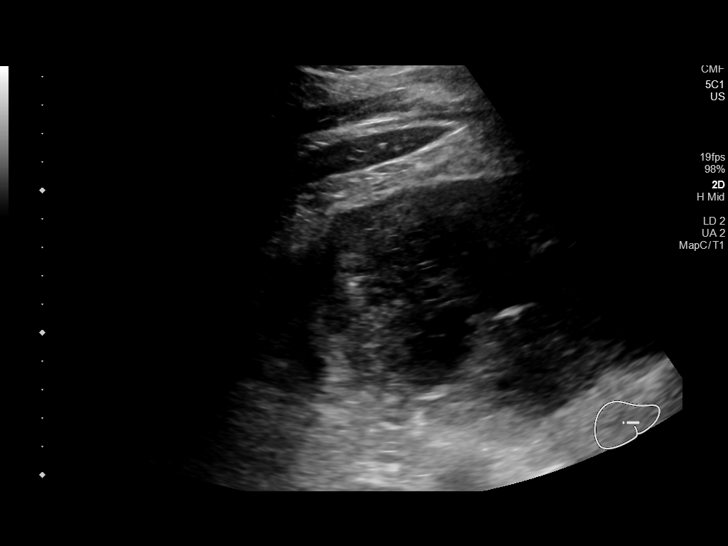
[im 31/37]
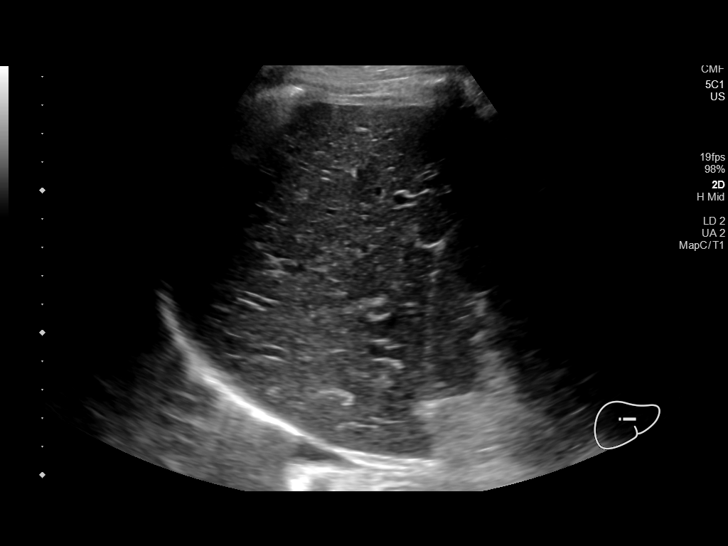
[im 34/37]
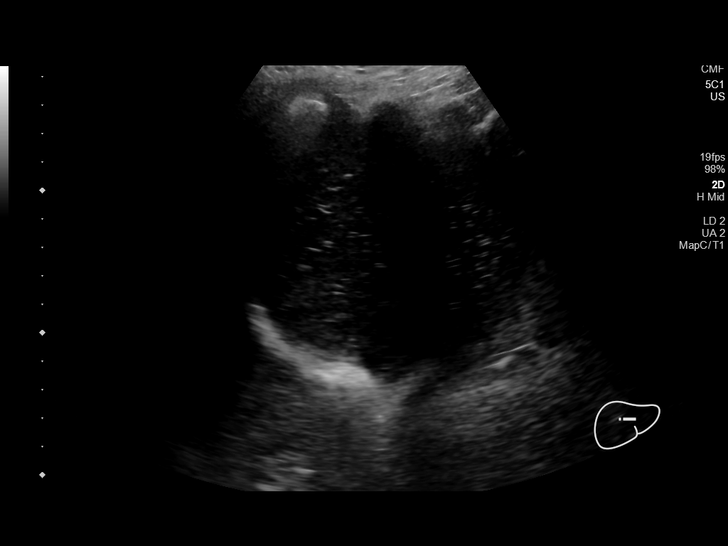
[im 37/37]
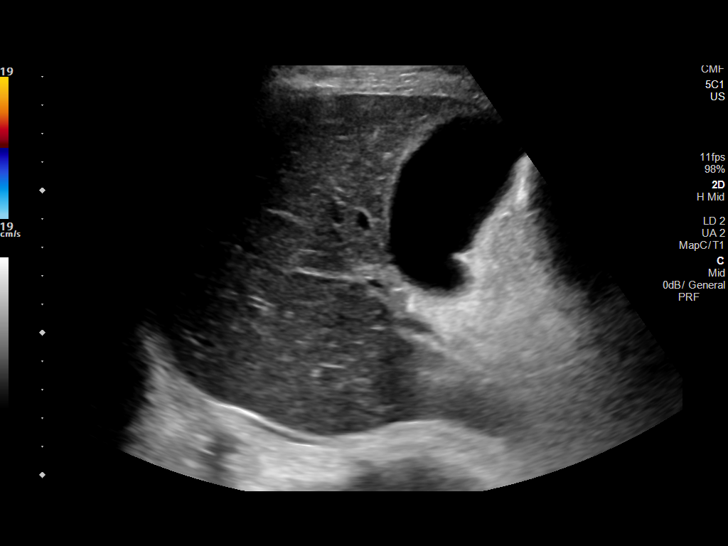

[14 of 25 positions shown; findings below may reference images not displayed]

FINDINGS: Gallbladder:

No gallstones or wall thickening visualized. No sonographic Murphy
sign noted by sonographer.

Common bile duct:

Diameter: Obscured by bowel gas. No gross intrahepatic duct
dilatation identified. No choledocholithiasis seen on recent CT.

Liver:

No focal lesion identified. Within normal limits in parenchymal
echogenicity. Portal vein is patent on color Doppler imaging with
normal direction of blood flow towards the liver.

Other: None.
IMPRESSION: No cholelithiasis. Obscured common duct, without biliary duct
dilatation or choledocholithiasis on recent CT.

## 2021-08-29 NOTE — Plan of Care (Signed)
°  Problem: Education: Goal: Knowledge of General Education information will improve Description: Including pain rating scale, medication(s)/side effects and non-pharmacologic comfort measures Outcome: Progressing   Problem: Health Behavior/Discharge Planning: Goal: Ability to manage health-related needs will improve Outcome: Progressing   Problem: Clinical Measurements: Goal: Ability to maintain clinical measurements within normal limits will improve Outcome: Progressing Goal: Will remain free from infection Outcome: Progressing Goal: Diagnostic test results will improve Outcome: Progressing Goal: Respiratory complications will improve Outcome: Progressing Goal: Cardiovascular complication will be avoided Outcome: Progressing   Problem: Activity: Goal: Risk for activity intolerance will decrease Outcome: Progressing   Problem: Nutrition: Goal: Adequate nutrition will be maintained Outcome: Progressing   Problem: Pain Managment: Goal: General experience of comfort will improve Outcome: Progressing

## 2021-08-29 NOTE — Progress Notes (Signed)
Transition of Care [facility]) Screening Note  Patient Details  Name: Dylan Good Date of Birth: [birth year]  Transition of Care [facility]) CM/SW Contact:    Sherie Don, LCSW Phone Number: 08/29/2021, 10:49 AM  Transition of Care Department [facility]) has reviewed patient and no TOC needs have been identified at this time. We will continue to monitor patient advancement through interdisciplinary progression rounds. If new patient transition needs arise, please place a TOC consult.

## 2021-08-29 NOTE — Hospital Course (Addendum)
51 yo with hx metastatic kidney cancer, hypertension, and multiple other medical problems who presented with recurrent pancreatitis.  Also incidentally covid positive.  He's improved with conservative management.  Plan for outpatient follow up.  See below for additional details

## 2021-08-29 NOTE — Assessment & Plan Note (Addendum)
This likely explains fever at presentation to 58 F He's higher risk for complications with his metastatic cancer Stop paxlovid as he thinks this is contributing to nausea Isolate at discharge

## 2021-08-29 NOTE — Progress Notes (Signed)
PROGRESS NOTE    Dylan Good  TTS:[id number] DOB: 08/17/1970 DOA: 08/28/2021 PCP: System, Provider Not In  Chief Complaint  Patient presents with   Nausea   Emesis    Brief Narrative:  51 yo with hx metastatic kidney cancer, hypertension, and multiple other medical problems who presented with recurrent pancreatitis.  Also incidentally covid positive.  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 site [facility])   Brain metastases (HCC)   Pleural effusion on left   Hypertension   Lung metastases (HCC)   Assessment and Plan: * Acute pancreatitis- (present on admission) Lipase peaked to 200 CT with hazy contour of pancreatic head, concerning for pancreatitis He's had multiple recent admissions for pancreatitis At last discharge, he was instructed to hold his oral chemo with concern that chemo meds maybe contributing (cabometyx), but he's had recurrence despite that RUQ [place] without cholelithiasis Triglycerides 47 IgG4 pending Denies etoh use I don't see obvious contributing meds May need to consult GI and discuss with them given recurrent pancreatitis  COVID-19 virus infection This likely explains fever at presentation to 89 F He's higher risk for complications with his metastatic cancer Will discuss paxlovid  Kidney cancer, primary, with metastasis 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  Brain metastases [facility])- (present on admission) noted  Lung metastases ([place])- (present on admission) noted  Hypertension- (present on admission) Continue norvasc  Pleural effusion on left- (present on admission) Noted    DVT prophylaxis: heparin Code Status:  full Family Communication: none  Disposition:   Status is: Observation The patient will require care spanning > 2 midnights and should be moved to inpatient because: recurrent pancreatitis, covid  Consultants:  GI  Procedures:  none  Antimicrobials:  Anti-infectives (From admission, onward)    None       Subjective: No new complaints  Objective: Vitals:   08/29/21 0500 08/29/21 0611 08/29/21 0939 08/29/21 1235  BP:  125/72 116/78 133/82  Pulse:  86 96 96  Resp:  18  18  Temp:  99.3 F (37.4 C) 100 F (37.8 C) 99.7 F (37.6 C)  TempSrc:  Oral Oral Oral  SpO2:  97% 98% 100%  Weight: 73 kg     Height:        Intake/Output Summary (Last 24 hours) at 08/29/2021 1525 Last data filed at 08/29/2021 1300 Gross per 24 hour  Intake 1374.06 ml  Output 700 ml  Net 674.06 ml   Filed Weights   08/28/21 1833 08/29/21 0500  Weight: 73 kg 73 kg    Examination:  General exam: Appears calm and comfortable  Respiratory system: Clear to auscultation. Respiratory effort normal. Cardiovascular system: RRR Gastrointestinal system: Abdomen is nondistended, soft and nontender Central nervous system: Alert and oriented. No focal neurological deficits. Extremities: no LEE Skin: No rashes, lesions or ulcers Psychiatry: Judgement and insight appear normal. Mood & affect appropriate.     Data Reviewed: I have personally reviewed following labs and imaging studies  CBC: Recent Labs  Lab 08/28/21 1930 08/29/21 0442  WBC 3.4* 2.6*  NEUTROABS 2.2 1.2*  HGB 12.7* 11.7*  HCT 40.0 37.7*  MCV 77.8* 79.4*  PLT 251 212    Basic  Metabolic Panel: Recent Labs  Lab 08/28/21 1930 08/29/21 0442  NA 133* 133*  K 3.5 3.6  CL 103 105  CO2 22 22  GLUCOSE 97 83  BUN 8 6  CREATININE 0.86 0.72  CALCIUM 8.7* 8.0*    GFR: Estimated Creatinine Clearance: 112.9 mL/min (by C-G formula based on SCr of 0.72 mg/dL).  Liver Function Tests: Recent Labs  Lab 08/28/21 1930  08/29/21 0442  AST 16 13*  ALT 12 11  ALKPHOS 71 62  BILITOT 1.0 0.8  PROT 7.8 6.5  ALBUMIN 3.8 3.2*    CBG: No results for input(s): GLUCAP in the last 168 hours.   Recent Results (from the past 240 hour(s))  Blood culture (routine x 2)     Status: None (Preliminary result)   Collection Time: 08/28/21  7:30 PM   Specimen: BLOOD  Result Value Ref Range Status   Specimen Description   Final    BLOOD RIGHT ANTECUBITAL Performed at [facility] [address] [place], [place] [address]    Special Requests   Final    BOTTLES DRAWN AEROBIC AND ANAEROBIC Blood Culture adequate volume Performed at [facility] [address] [place], [place] [address]    Culture   Final    NO GROWTH < 12 HOURS Performed at [facility] [address] [place], [place] [address]    Report Status PENDING  Incomplete  Resp Panel by RT-PCR (Flu Evy Lutterman&B, Covid)     Status: Abnormal   Collection Time: 08/28/21  7:33 PM   Specimen: Nasopharyngeal(NP) swabs in vial transport medium  Result Value Ref Range Status   SARS Coronavirus 2 by RT PCR POSITIVE (Kina Shiffman) NEGATIVE Final    Comment: (NOTE) SARS-CoV-2 target nucleic acids are DETECTED.  The SARS-CoV-2 RNA is generally detectable in upper respiratory specimens during the acute phase of infection. Positive results are indicative of the presence of the identified virus, but do not rule out bacterial infection or co-infection with other pathogens not detected by the test. Clinical correlation with patient history and other diagnostic information is necessary to determine patient infection status. The expected result is Negative.  Fact Sheet for Patients: EntrepreneurPulse.com.au  Fact Sheet for Healthcare Providers: IncredibleEmployment.be  This test is not yet approved or cleared by the [place] FDA and  has been authorized for detection and/or diagnosis of  SARS-CoV-2 by FDA under an Emergency Use Authorization (EUA).  This EUA will remain in effect (meaning this test can be used) for the duration of  the COVID-19 declaration under Section 564(b)(1) of the [place]oni [place]ker ct, 21 U.S.C. section 360bbb-3(b)(1), unless the authorization is terminated or revoked sooner.     Influenza Karene Bracken by PCR NEGATIVE NEGATIVE Final   Influenza B by PCR NEGATIVE NEGATIVE Final    Comment: (NOTE) The Xpert Xpress SARS-CoV-2/FLU/RSV plus assay is intended as an aid in the diagnosis of influenza from Nasopharyngeal swab specimens and should not be used as Sheryle Vice sole basis for treatment. Nasal washings and aspirates are unacceptable for Xpert Xpress SARS-CoV-2/FLU/RSV testing.  Fact Sheet for Patients: EntrepreneurPulse.com.au  Fact Sheet for Healthcare Providers: IncredibleEmployment.be  This test is not yet approved or cleared by the [place] FDA and has been authorized for detection and/or diagnosis of SARS-CoV-2 by FDA under an Emergency Use Authorization (EUA). This EUA will remain in effect (meaning this test can be used) for the duration of the COVID-19 declaration under Section 564(b)(1) of the Act, 21 U.S.C. section  360bbb-3(b)(1), unless the authorization is terminated or revoked.  Performed at [facility], [place] [address], [place], [place] [address]   Blood culture (routine x 2)     Status: None (Preliminary result)   Collection Time: 08/28/21  7:45 PM   Specimen: BLOOD  Result Value Ref Range Status   Specimen Description   Final    BLOOD LEFT ANTECUBITAL Performed at [facility] [address] [place], [place] [address]    Special Requests   Final    BOTTLES DRAWN AEROBIC AND ANAEROBIC Blood Culture adequate volume Performed at [facility] [address] [place], [place] [address]    Culture   Final    NO GROWTH < 12 HOURS Performed at [facility] [address], [place], [place] [address]    Report Status PENDING  Incomplete         Radiology Studies: DG Chest 2 View  Result Date: 08/28/2021 CLINICAL DATA:  Cough and fever.  Metastatic renal carcinoma. EXAM: CHEST - 2 VIEW COMPARISON:  04/04/2021 FINDINGS: Multiple bilateral pulmonary nodules are best seen on prior CT. Lung nodules the right appear mildly improved from the prior study. Decreased nodularity and infiltrate in the left lower lobe. Large extrapleural mass and rib destruction involving the left fifth rib laterally. This is similar to the prior study. Small left pleural effusion with mild interval improvement. Right paratracheal adenopathy similar to the prior study. IMPRESSION: Negative for acute pneumonia Metastatic disease in the chest. Several lung nodules appear smaller. Improved aeration left lower lobe.  Decreased left effusion. Electronically Signed   By: Franchot Gallo M.D.   On: 08/28/2021 19:08   CT Angio Chest PE W and/or Wo Contrast  Result Date: 08/28/2021 CLINICAL DATA:  Pulmonary embolism (PE) suspected, high prob; Sepsis. Patient with left-sided chest, rib, flank pain. Patient has history of metastatic cancer. He did not know he had Keian Odriscoll fever until he arrived today. Has cough which is chronic. Has had multiple episodes of emesis. Recently discharged for pancreatitis. Metastatic urologic cancer. EXAM: CT ANGIOGRAPHY CHEST CT ABDOMEN AND PELVIS WITH CONTRAST TECHNIQUE: Multidetector CT imaging of the chest was performed using the standard protocol during bolus administration of intravenous contrast. Multiplanar CT image reconstructions and MIPs were obtained to evaluate the vascular anatomy. Multidetector CT imaging of the abdomen and pelvis was performed using the standard protocol during bolus administration of intravenous contrast. RADIATION DOSE REDUCTION: This exam was performed according to the departmental dose-optimization program which includes  automated exposure control, adjustment of the mA and/or kV according to patient size and/or use of iterative reconstruction technique. CONTRAST:  132mL OMNIPAQUE IOHEXOL 350 MG/ML SOLN COMPARISON:  CT abdomen pelvis 07/01/2021, CT chest abdomen pelvis 04/16/2021. FINDINGS: CTA CHEST FINDINGS Cardiovascular: Satisfactory opacification of the pulmonary arteries to the segmental level. No evidence of pulmonary embolism. Normal heart size. No significant pericardial effusion. The thoracic aorta is normal in caliber. No atherosclerotic plaque of the thoracic aorta. No coronary artery calcifications. Mediastinum/Nodes: Interval decrease in size of conglomerative mediastinal lymphadenopathy with as an example Brieanne Mignone subcarinal 2 x 5.5 cm lesion (from 5.9 x 3.1 cm). Interval decrease in hilar lymphadenopathy with as an example Eldrige Pitkin 1.8 cm (from 2.6 cm) right hilar lymph node (6:38). No axillary lymph nodes. Thyroid gland, trachea, and esophagus demonstrate no significant findings. Lungs/Pleura: Interval decrease in size of innumerable pulmonary metastases with as an example Khloie Hamada 3.3 x 2.6 cm (from 3.7 x 3.6 cm)  left lower lobe (12:109) status as well as Braedyn Kauk left upper lobe 1.8 x 2.1 cm (12:71) right upper lobe (from 2.2 x 2.1 cm). No pulmonary mass. Stable left pleural effusion. Redemonstration of interval decrease in size of associated left pleural nodularity/mass (6:81) now measuring 8.2 x 1.3 cm (from 8.7 x 2.3 cm). No pneumothorax. Musculoskeletal: No chest wall abnormality. No suspicious lytic or blastic osseous lesions. No acute displaced fracture. Interval decrease in size of Blondell Laperle left lateral fifth rib soft tissue metastasis now measuring 6.3 x 4.1 cm (from 6.8 x 4.9 cm) with associated persistent resorption/destruction of the lateral fifth rib. Review of the MIP images confirms the above findings. CT ABDOMEN and PELVIS FINDINGS Hepatobiliary: No focal liver abnormality. No gallstones, gallbladder wall thickening, or  pericholecystic fluid. No biliary dilatation. Pancreas: No focal lesion. Query slightly hazy contour of the pancreatic head. Otherwise no surrounding inflammatory changes. No main pancreatic ductal dilatation. No pancreatic pseudocyst formation. Spleen: Normal in size without focal abnormality. Adrenals/Urinary Tract: Grossly stable 6 x 2.4 cm (from 6 x 2.6 cm) left adrenal gland metastatic lesion (2:27). The right adrenal gland is normal with no nodularity. Bilateral kidneys enhance symmetrically. Redemonstration of Greenly Rarick grossly stable in size heterogeneous 12 x 10 cm right renal lesion. Redemonstration of several other subcentimeter hypodensities within bilateral kidneys. Interval decrease in size of Sheena Donegan heterogeneous 2.1 x 2.1 (from 2.2 x 2.3 cm) left renal lesion (2:37). No hydronephrosis. No hydroureter. The urinary bladder is unremarkable. Stomach/Bowel: Stomach is within normal limits. No evidence of bowel wall thickening or dilatation. The appendix not definitely identified. Vascular/Lymphatic: The inferior vena cava is patent. The hepatic, portal, splenic, superior mesenteric veins are patent. The right renal vein appears to be patent. The left renal vein appears to be patent. No abdominal aorta or iliac aneurysm. Mild atherosclerotic plaque of the aorta and its branches. No abdominal, pelvic, or inguinal lymphadenopathy. Reproductive: Prominent prostate. Other: No intraperitoneal free fluid. No intraperitoneal free gas. No organized fluid collection. Musculoskeletal: No abdominal wall hernia or abnormality. No suspicious lytic or blastic osseous lesions. No acute displaced fracture. Multilevel degenerative changes of the spine. Bilateral sacroiliac joint degenerative changes. Review of the MIP images confirms the above findings. IMPRESSION: 1. No pulmonary embolus. 2. Query slightly hazy contour of the pancreatic head. Correlate with lipase levels for acute pancreatitis. 3. Grossly stable 12 cm right renal  malignancy. 4. Interval decrease in size of multiple known metastases including bilateral pulmonary metastases, left pleural metastases, hilar and mediastinal lymphadenopathy, as well as Rohail Klees left chest wall metastasis involving the left fifth rib. 5. Grossly stable left adrenal gland metastasis. 6. Grossly stable to possibly slightly decreased in size 2.1 cm left renal malignancy. Electronically Signed   By: Iven Finn M.D.   On: 08/28/2021 20:53   CT ABDOMEN PELVIS W CONTRAST  Result Date: 08/28/2021 CLINICAL DATA:  Pulmonary embolism (PE) suspected, high prob; Sepsis. Patient with left-sided chest, rib, flank pain. Patient has history of metastatic cancer. He did not know he had Eleonora Peeler fever until he arrived today. Has cough which is chronic. Has had multiple episodes of emesis. Recently discharged for pancreatitis. Metastatic urologic cancer. EXAM: CT ANGIOGRAPHY CHEST CT ABDOMEN AND PELVIS WITH CONTRAST TECHNIQUE: Multidetector CT imaging of the chest was performed using the standard protocol during bolus administration of intravenous contrast. Multiplanar CT image reconstructions and MIPs were obtained to evaluate the vascular anatomy. Multidetector CT imaging of the abdomen and pelvis was performed using the standard protocol during bolus  administration of intravenous contrast. RADIATION DOSE REDUCTION: This exam was performed according to the departmental dose-optimization program which includes automated exposure control, adjustment of the mA and/or kV according to patient size and/or use of iterative reconstruction technique. CONTRAST:  188mL OMNIPAQUE IOHEXOL 350 MG/ML SOLN COMPARISON:  CT abdomen pelvis 07/01/2021, CT chest abdomen pelvis 04/16/2021. FINDINGS: CTA CHEST FINDINGS Cardiovascular: Satisfactory opacification of the pulmonary arteries to the segmental level. No evidence of pulmonary embolism. Normal heart size. No significant pericardial effusion. The thoracic aorta is normal in caliber. No  atherosclerotic plaque of the thoracic aorta. No coronary artery calcifications. Mediastinum/Nodes: Interval decrease in size of conglomerative mediastinal lymphadenopathy with as an example Aasim Restivo subcarinal 2 x 5.5 cm lesion (from 5.9 x 3.1 cm). Interval decrease in hilar lymphadenopathy with as an example Bronsyn Shappell 1.8 cm (from 2.6 cm) right hilar lymph node (6:38). No axillary lymph nodes. Thyroid gland, trachea, and esophagus demonstrate no significant findings. Lungs/Pleura: Interval decrease in size of innumerable pulmonary metastases with as an example Laquana Villari 3.3 x 2.6 cm (from 3.7 x 3.6 cm) left lower lobe (12:109) status as well as Mekiah Cambridge left upper lobe 1.8 x 2.1 cm (12:71) right upper lobe (from 2.2 x 2.1 cm). No pulmonary mass. Stable left pleural effusion. Redemonstration of interval decrease in size of associated left pleural nodularity/mass (6:81) now measuring 8.2 x 1.3 cm (from 8.7 x 2.3 cm). No pneumothorax. Musculoskeletal: No chest wall abnormality. No suspicious lytic or blastic osseous lesions. No acute displaced fracture. Interval decrease in size of Keiran Gaffey left lateral fifth rib soft tissue metastasis now measuring 6.3 x 4.1 cm (from 6.8 x 4.9 cm) with associated persistent resorption/destruction of the lateral fifth rib. Review of the MIP images confirms the above findings. CT ABDOMEN and PELVIS FINDINGS Hepatobiliary: No focal liver abnormality. No gallstones, gallbladder wall thickening, or pericholecystic fluid. No biliary dilatation. Pancreas: No focal lesion. Query slightly hazy contour of the pancreatic head. Otherwise no surrounding inflammatory changes. No main pancreatic ductal dilatation. No pancreatic pseudocyst formation. Spleen: Normal in size without focal abnormality. Adrenals/Urinary Tract: Grossly stable 6 x 2.4 cm (from 6 x 2.6 cm) left adrenal gland metastatic lesion (2:27). The right adrenal gland is normal with no nodularity. Bilateral kidneys enhance symmetrically. Redemonstration of Yanin Muhlestein grossly  stable in size heterogeneous 12 x 10 cm right renal lesion. Redemonstration of several other subcentimeter hypodensities within bilateral kidneys. Interval decrease in size of Skeeter Sheard heterogeneous 2.1 x 2.1 (from 2.2 x 2.3 cm) left renal lesion (2:37). No hydronephrosis. No hydroureter. The urinary bladder is unremarkable. Stomach/Bowel: Stomach is within normal limits. No evidence of bowel wall thickening or dilatation. The appendix not definitely identified. Vascular/Lymphatic: The inferior vena cava is patent. The hepatic, portal, splenic, superior mesenteric veins are patent. The right renal vein appears to be patent. The left renal vein appears to be patent. No abdominal aorta or iliac aneurysm. Mild atherosclerotic plaque of the aorta and its branches. No abdominal, pelvic, or inguinal lymphadenopathy. Reproductive: Prominent prostate. Other: No intraperitoneal free fluid. No intraperitoneal free gas. No organized fluid collection. Musculoskeletal: No abdominal wall hernia or abnormality. No suspicious lytic or blastic osseous lesions. No acute displaced fracture. Multilevel degenerative changes of the spine. Bilateral sacroiliac joint degenerative changes. Review of the MIP images confirms the above findings. IMPRESSION: 1. No pulmonary embolus. 2. Query slightly hazy contour of the pancreatic head. Correlate with lipase levels for acute pancreatitis. 3. Grossly stable 12 cm right renal malignancy. 4. Interval decrease in size of multiple  known metastases including bilateral pulmonary metastases, left pleural metastases, hilar and mediastinal lymphadenopathy, as well as Cordella Nyquist left chest wall metastasis involving the left fifth rib. 5. Grossly stable left adrenal gland metastasis. 6. Grossly stable to possibly slightly decreased in size 2.1 cm left renal malignancy. Electronically Signed   By: Iven Finn M.D.   On: 08/28/2021 20:53   US Abdomen Limited RUQ (LIVER/GB)  Result Date: 08/29/2021 CLINICAL DATA:   Pancreatitis EXAM: ULTRASOUND ABDOMEN LIMITED RIGHT UPPER QUADRANT COMPARISON:  CT 08/28/2021 FINDINGS: Gallbladder: No gallstones or wall thickening visualized. No sonographic Murphy sign noted by sonographer. Common bile duct: Diameter: Obscured by bowel gas. No gross intrahepatic duct dilatation identified. No choledocholithiasis seen on recent CT. Liver: No focal lesion identified. Within normal limits in parenchymal echogenicity. Portal vein is patent on color Doppler imaging with normal direction of blood flow towards the liver. Other: None. IMPRESSION: No cholelithiasis. Obscured common duct, without biliary duct dilatation or choledocholithiasis on recent CT. Electronically Signed   By: Abigail Miyamoto M.D.   On: 08/29/2021 11:59        Scheduled Meds:  heparin  5,000 Units Subcutaneous Q8H   pantoprazole  40 mg Oral Daily   Continuous Infusions:  lactated ringers 75 mL/hr at 08/29/21 0806     LOS: 0 days    Time spent: over 30 min    Fayrene Helper, MD [facility]   To contact the attending provider between 7A-7P or the covering provider during after hours 7P-7A, please log into the web site [email or web address] and access using universal [place] password for that web site. If you do not have the password, please call the hospital operator.  08/29/2021, 3:25 PM

## 2021-08-30 ENCOUNTER — Other Ambulatory Visit (HOSPITAL_COMMUNITY): Payer: Self-pay

## 2021-08-30 DIAGNOSIS — K859 Acute pancreatitis without necrosis or infection, unspecified: Secondary | ICD-10-CM | POA: Diagnosis not present

## 2021-08-30 LAB — COMPREHENSIVE METABOLIC PANEL
ALT: 11 U/L (ref 0–44)
AST: 16 U/L (ref 15–41)
Albumin: 3.1 g/dL — ABNORMAL LOW (ref 3.5–5.0)
Alkaline Phosphatase: 51 U/L (ref 38–126)
Anion gap: 8 (ref 5–15)
BUN: 6 mg/dL (ref 6–20)
CO2: 19 mmol/L — ABNORMAL LOW (ref 22–32)
Calcium: 7.8 mg/dL — ABNORMAL LOW (ref 8.9–10.3)
Chloride: 103 mmol/L (ref 98–111)
Creatinine, Ser: 0.68 mg/dL (ref 0.61–1.24)
GFR, Estimated: >60 mL/min (ref >60)
Glucose, Bld: 70 mg/dL (ref 70–99)
Potassium: 3.6 mmol/L (ref 3.5–5.1)
Sodium: 130 mmol/L — ABNORMAL LOW (ref 135–145)
Total Bilirubin: 0.9 mg/dL (ref 0.3–1.2)
Total Protein: 6.6 g/dL (ref 6.5–8.1)

## 2021-08-30 LAB — CBC WITH DIFFERENTIAL/PLATELET
Abs Immature Granulocytes: 0.01 10*3/uL (ref 0.00–0.07)
Basophils Absolute: 0 10*3/uL (ref 0.0–0.1)
Basophils Relative: 0 %
Eosinophils Absolute: 0 10*3/uL (ref 0.0–0.5)
Eosinophils Relative: 2 %
HCT: 36.6 % — ABNORMAL LOW (ref 39.0–52.0)
Hemoglobin: 11.5 g/dL — ABNORMAL LOW (ref 13.0–17.0)
Immature Granulocytes: 0 %
Lymphocytes Relative: 32 %
Lymphs Abs: 0.8 10*3/uL (ref 0.7–4.0)
MCH: 24.9 pg — ABNORMAL LOW (ref 26.0–34.0)
MCHC: 31.4 g/dL (ref 30.0–36.0)
MCV: 79.2 fL — ABNORMAL LOW (ref 80.0–100.0)
Monocytes Absolute: 0.4 10*3/uL (ref 0.1–1.0)
Monocytes Relative: 18 %
Neutro Abs: 1.1 10*3/uL — ABNORMAL LOW (ref 1.7–7.7)
Neutrophils Relative %: 48 %
Platelets: 209 10*3/uL (ref 150–400)
RBC: 4.62 MIL/uL (ref 4.22–5.81)
RDW: 16.5 % — ABNORMAL HIGH (ref 11.5–15.5)
WBC: 2.4 10*3/uL — ABNORMAL LOW (ref 4.0–10.5)
nRBC: 0 % (ref 0.0–0.2)

## 2021-08-30 LAB — PHOSPHORUS: Phosphorus: 2.2 mg/dL — ABNORMAL LOW (ref 2.5–4.6)

## 2021-08-30 LAB — IGG 4: IgG, Subclass 4: 25 mg/dL (ref 2–96)

## 2021-08-30 LAB — MAGNESIUM: Magnesium: 1.6 mg/dL — ABNORMAL LOW (ref 1.7–2.4)

## 2021-08-30 MED ORDER — NIRMATRELVIR/RITONAVIR (PAXLOVID)TABLET
3.0000 | ORAL_TABLET | Freq: Two times a day (BID) | ORAL | Status: DC
  Administered 2021-08-30 – 2021-09-01 (×5): 3 via ORAL
  Filled 2021-08-30: qty 30

## 2021-08-30 MED ORDER — MAGNESIUM SULFATE 2 GM/50ML IV SOLN
2.0000 g | Freq: Once | INTRAVENOUS | Status: AC
  Administered 2021-08-30: 2 g via INTRAVENOUS
  Filled 2021-08-30: qty 50

## 2021-08-30 NOTE — Progress Notes (Signed)
PROGRESS NOTE    Dylan Good  HDQ:[id number] DOB: 11-17-1970 DOA: 08/28/2021 PCP: System, Provider Not In  Chief Complaint  Patient presents with   Nausea   Emesis    Brief Narrative:  51 yo with hx metastatic kidney cancer, hypertension, and multiple other medical problems who presented with recurrent pancreatitis.  Also incidentally covid positive.  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 site [facility])   Brain metastases (HCC)   Pleural effusion on left   Hypertension   Lung metastases (HCC)   Pancreatitis   Assessment and Plan: * Acute pancreatitis- (present on admission) Lipase peaked to 200 CT with hazy contour of pancreatic head, concerning for pancreatitis He's had multiple recent admissions for pancreatitis At last discharge, he was instructed to hold his oral chemo with concern that chemo meds maybe contributing (cabometyx), but he's had recurrence despite that RUQ [place] without cholelithiasis Triglycerides 47 IgG4 pending Denies etoh use I don't see obvious contributing meds Will adat Gi c/s given recurrent pancreatitis, appreciate recommendations  COVID-19 virus infection This likely explains fever at presentation to 45 F He's higher risk for complications with his metastatic cancer Start paxlovid, discussed risks/benefits (holding amlodipine)  Kidney cancer, primary, with metastasis from kidney to other site [facility])- (present on admission) He's not taking oral chemo right now (with concern that it was causing his recurrent pancreatitis) Follows outpatient with Dr. Alen Good  Brain metastases [facility])- (present on admission) noted  Lung metastases ([place])- (present on admission) noted  Hypertension- (present on admission) Continue norvasc  Pleural effusion on left- (present on admission) Noted     DVT prophylaxis: heparin Code Status: full Family Communication: none  Disposition:   Status is: Observation The patient will require care spanning > 2 midnights and should be moved to inpatient because: recurrent pancreatitis, covid  Consultants:  GI  Procedures:  none  Antimicrobials:  Anti-infectives (From admission, onward)    Start     Dose/Rate Route Frequency Ordered Stop   08/30/21 1300  nirmatrelvir/ritonavir EUA (PAXLOVID) 3 tablet        3 tablet Oral 2 times daily 08/30/21 1211 09/04/21 0959       Subjective: No new complaints Interested in paxlovid after discussing options   Objective: Vitals:   08/29/21 2131 08/30/21 0500 08/30/21 0518 08/30/21 1336  BP: 127/81  134/75 132/76  Pulse: 100  (!) 102 87  Resp: 18  18 16   Temp: 99.4 F (37.4 C)  100.3 F (37.9 C) 98 F (36.7 C)  TempSrc: Oral  Oral   SpO2: 97%  97% 99%  Weight:  73.1 kg    Height:        Intake/Output Summary (Last 24 hours) at 08/30/2021 1620 Last data filed at 08/30/2021 1400 Gross per 24 hour  Intake 2858.48 ml  Output 2900 ml  Net -41.52 ml   Filed Weights   08/28/21 1833 08/29/21 0500 08/30/21 0500  Weight: 73 kg 73 kg 73.1 kg    Examination:  General: No acute distress. Cardiovascular: RRR Lungs: unlabored Abdomen: mild epigastric TTP Neurological: Alert and oriented 3. Moves all extremities 4 . Cranial nerves II through XII grossly intact. Skin: Warm and dry. No rashes or lesions. Extremities: No clubbing or cyanosis. No edema.    Data Reviewed: I have personally reviewed following labs and imaging  studies  CBC: Recent Labs  Lab 08/28/21 1930 08/29/21 0442 08/30/21 0433  WBC 3.4* 2.6* 2.4*  NEUTROABS 2.2 1.2* 1.1*  HGB 12.7* 11.7* 11.5*  HCT 40.0 37.7* 36.6*  MCV 77.8* 79.4* 79.2*  PLT 251 212 604    Basic Metabolic Panel: Recent Labs  Lab 08/28/21 1930 08/29/21 0442 08/30/21 0433  NA 133* 133* 130*  K 3.5 3.6 3.6  CL 103 105 103  CO2 22  22 19*  GLUCOSE 97 83 70  BUN 8 6 6   CREATININE 0.86 0.72 0.68  CALCIUM 8.7* 8.0* 7.8*  MG  --   --  1.6*  PHOS  --   --  2.2*    GFR: Estimated Creatinine Clearance: 112.9 mL/min (by C-G formula based on SCr of 0.68 mg/dL).  Liver Function Tests: Recent Labs  Lab 08/28/21 1930 08/29/21 0442 08/30/21 0433  AST 16 13* 16  ALT 12 11 11   ALKPHOS 71 62 51  BILITOT 1.0 0.8 0.9  PROT 7.8 6.5 6.6  ALBUMIN 3.8 3.2* 3.1*    CBG: No results for input(s): GLUCAP in the last 168 hours.   Recent Results (from the past 240 hour(s))  Blood culture (routine x 2)     Status: None (Preliminary result)   Collection Time: 08/28/21  7:30 PM   Specimen: BLOOD  Result Value Ref Range Status   Specimen Description   Final    BLOOD RIGHT ANTECUBITAL Performed at [facility] [address] [place], [place] [address]    Special Requests   Final    BOTTLES DRAWN AEROBIC AND ANAEROBIC Blood Culture adequate volume Performed at [facility] [address] [place], [place] [address]    Culture   Final    NO GROWTH 2 DAYS Performed at [facility] [address] [place], [place] [address]    Report Status PENDING  Incomplete  Resp Panel by RT-PCR (Flu Dylan Good&B, Covid)     Status: Abnormal   Collection Time: 08/28/21  7:33 PM   Specimen: Nasopharyngeal(NP) swabs in vial transport medium  Result Value Ref Range Status   SARS Coronavirus 2 by RT PCR POSITIVE (Dylan Good) NEGATIVE Final    Comment: (NOTE) SARS-CoV-2 target nucleic acids are DETECTED.  The SARS-CoV-2 RNA is generally detectable in upper respiratory specimens during the acute phase of infection. Positive results are indicative of the presence of the identified virus, but do not rule out bacterial infection or co-infection with other pathogens not detected by the test. Clinical correlation with patient history and other diagnostic information is necessary to determine patient infection  status. The expected result is Negative.  Fact Sheet for Patients: EntrepreneurPulse.com.au  Fact Sheet for Healthcare Providers: IncredibleEmployment.be  This test is not yet approved or cleared by the [place] FDA and  has been authorized for detection and/or diagnosis of SARS-CoV-2 by FDA under an Emergency Use Authorization (EUA).  This EUA will remain in effect (meaning this test can be used) for the duration of  the COVID-19 declaration under Section 564(b)(1) of the [address], 21 U.S.C. section 360bbb-3(b)(1), unless the authorization is terminated or revoked sooner.     Influenza Margret Moat by PCR NEGATIVE NEGATIVE Final   Influenza B by PCR NEGATIVE NEGATIVE Final    Comment: (NOTE) The Xpert Xpress SARS-CoV-2/FLU/RSV plus assay is intended as an aid in the diagnosis of influenza from Nasopharyngeal swab specimens and should not be used as Kensley Valladares sole basis for treatment. Nasal washings  and aspirates are unacceptable for Xpert Xpress SARS-CoV-2/FLU/RSV testing.  Fact Sheet for Patients: EntrepreneurPulse.com.au  Fact Sheet for Healthcare Providers: IncredibleEmployment.be  This test is not yet approved or cleared by the [place] FDA and has been authorized for detection and/or diagnosis of SARS-CoV-2 by FDA under an Emergency Use Authorization (EUA). This EUA will remain in effect (meaning this test can be used) for the duration of the COVID-19 declaration under Section 564(b)(1) of the Act, 21 U.S.C. section 360bbb-3(b)(1), unless the authorization is terminated or revoked.  Performed at [facility], [place] [address] [place], [place] [address]   Blood culture (routine x 2)     Status: None (Preliminary result)   Collection Time: 08/28/21  7:45 PM   Specimen: BLOOD  Result Value Ref Range Status   Specimen Description   Final    BLOOD LEFT ANTECUBITAL Performed at [facility] [address], [place], [place] [address]    Special Requests   Final    BOTTLES DRAWN AEROBIC AND ANAEROBIC Blood Culture adequate volume Performed at [facility] [address], [place], [place] [address]    Culture   Final    NO GROWTH 2 DAYS Performed at [facility] [address] [place], [place] [address]    Report Status PENDING  Incomplete         Radiology Studies: DG Chest 2 View  Result Date: 08/28/2021 CLINICAL DATA:  Cough and fever.  Metastatic renal carcinoma. EXAM: CHEST - 2 VIEW COMPARISON:  04/04/2021 FINDINGS: Multiple bilateral pulmonary nodules are best seen on prior CT. Lung nodules the right appear mildly improved from the prior study. Decreased nodularity and infiltrate in the left lower lobe. Large extrapleural mass and rib destruction involving the left fifth rib laterally. This is similar to the prior study. Small left pleural effusion with mild interval improvement. Right paratracheal adenopathy similar to the prior study. IMPRESSION: Negative for acute pneumonia Metastatic disease in the chest. Several lung nodules appear smaller. Improved aeration left lower lobe.  Decreased left effusion. Electronically Signed   By: Franchot Gallo M.D.   On: 08/28/2021 19:08   CT Angio Chest PE W and/or Wo Contrast  Result Date: 08/28/2021 CLINICAL DATA:  Pulmonary embolism (PE) suspected, high prob; Sepsis. Patient with left-sided chest, rib, flank pain. Patient has history of metastatic cancer. He did not know he had Paz Winsett fever until he arrived today. Has cough which is chronic. Has had multiple episodes of emesis. Recently discharged for pancreatitis. Metastatic urologic cancer. EXAM: CT ANGIOGRAPHY CHEST CT ABDOMEN AND PELVIS WITH CONTRAST TECHNIQUE: Multidetector CT imaging of the chest was performed using the standard protocol during bolus administration of intravenous contrast. Multiplanar CT image reconstructions and MIPs  were obtained to evaluate the vascular anatomy. Multidetector CT imaging of the abdomen and pelvis was performed using the standard protocol during bolus administration of intravenous contrast. RADIATION DOSE REDUCTION: This exam was performed according to the departmental dose-optimization program which includes automated exposure control, adjustment of the mA and/or kV according to patient size and/or use of iterative reconstruction technique. CONTRAST:  153mL OMNIPAQUE IOHEXOL 350 MG/ML SOLN COMPARISON:  CT abdomen pelvis 07/01/2021, CT chest abdomen pelvis 04/16/2021. FINDINGS: CTA CHEST FINDINGS Cardiovascular: Satisfactory opacification of the pulmonary arteries to the segmental level. No evidence of pulmonary embolism. Normal heart size. No significant pericardial effusion. The thoracic aorta is normal in caliber. No atherosclerotic plaque of the thoracic aorta. No coronary artery calcifications.  Mediastinum/Nodes: Interval decrease in size of conglomerative mediastinal lymphadenopathy with as an example Kingsly Kloepfer subcarinal 2 x 5.5 cm lesion (from 5.9 x 3.1 cm). Interval decrease in hilar lymphadenopathy with as an example Jaysun Wessels 1.8 cm (from 2.6 cm) right hilar lymph node (6:38). No axillary lymph nodes. Thyroid gland, trachea, and esophagus demonstrate no significant findings. Lungs/Pleura: Interval decrease in size of innumerable pulmonary metastases with as an example Annelise Mccoy 3.3 x 2.6 cm (from 3.7 x 3.6 cm) left lower lobe (12:109) status as well as Blasa Raisch left upper lobe 1.8 x 2.1 cm (12:71) right upper lobe (from 2.2 x 2.1 cm). No pulmonary mass. Stable left pleural effusion. Redemonstration of interval decrease in size of associated left pleural nodularity/mass (6:81) now measuring 8.2 x 1.3 cm (from 8.7 x 2.3 cm). No pneumothorax. Musculoskeletal: No chest wall abnormality. No suspicious lytic or blastic osseous lesions. No acute displaced fracture. Interval decrease in size of William Laske left lateral fifth rib soft tissue  metastasis now measuring 6.3 x 4.1 cm (from 6.8 x 4.9 cm) with associated persistent resorption/destruction of the lateral fifth rib. Review of the MIP images confirms the above findings. CT ABDOMEN and PELVIS FINDINGS Hepatobiliary: No focal liver abnormality. No gallstones, gallbladder wall thickening, or pericholecystic fluid. No biliary dilatation. Pancreas: No focal lesion. Query slightly hazy contour of the pancreatic head. Otherwise no surrounding inflammatory changes. No main pancreatic ductal dilatation. No pancreatic pseudocyst formation. Spleen: Normal in size without focal abnormality. Adrenals/Urinary Tract: Grossly stable 6 x 2.4 cm (from 6 x 2.6 cm) left adrenal gland metastatic lesion (2:27). The right adrenal gland is normal with no nodularity. Bilateral kidneys enhance symmetrically. Redemonstration of Deivi Huckins grossly stable in size heterogeneous 12 x 10 cm right renal lesion. Redemonstration of several other subcentimeter hypodensities within bilateral kidneys. Interval decrease in size of Jamori Biggar heterogeneous 2.1 x 2.1 (from 2.2 x 2.3 cm) left renal lesion (2:37). No hydronephrosis. No hydroureter. The urinary bladder is unremarkable. Stomach/Bowel: Stomach is within normal limits. No evidence of bowel wall thickening or dilatation. The appendix not definitely identified. Vascular/Lymphatic: The inferior vena cava is patent. The hepatic, portal, splenic, superior mesenteric veins are patent. The right renal vein appears to be patent. The left renal vein appears to be patent. No abdominal aorta or iliac aneurysm. Mild atherosclerotic plaque of the aorta and its branches. No abdominal, pelvic, or inguinal lymphadenopathy. Reproductive: Prominent prostate. Other: No intraperitoneal free fluid. No intraperitoneal free gas. No organized fluid collection. Musculoskeletal: No abdominal wall hernia or abnormality. No suspicious lytic or blastic osseous lesions. No acute displaced fracture. Multilevel degenerative  changes of the spine. Bilateral sacroiliac joint degenerative changes. Review of the MIP images confirms the above findings. IMPRESSION: 1. No pulmonary embolus. 2. Query slightly hazy contour of the pancreatic head. Correlate with lipase levels for acute pancreatitis. 3. Grossly stable 12 cm right renal malignancy. 4. Interval decrease in size of multiple known metastases including bilateral pulmonary metastases, left pleural metastases, hilar and mediastinal lymphadenopathy, as well as Sherle Mello left chest wall metastasis involving the left fifth rib. 5. Grossly stable left adrenal gland metastasis. 6. Grossly stable to possibly slightly decreased in size 2.1 cm left renal malignancy. Electronically Signed   By: Iven Finn M.D.   On: 08/28/2021 20:53   CT ABDOMEN PELVIS W CONTRAST  Result Date: 08/28/2021 CLINICAL DATA:  Pulmonary embolism (PE) suspected, high prob; Sepsis. Patient with left-sided chest, rib, flank pain. Patient has history of metastatic cancer. He did not know he had Thu Baggett  fever until he arrived today. Has cough which is chronic. Has had multiple episodes of emesis. Recently discharged for pancreatitis. Metastatic urologic cancer. EXAM: CT ANGIOGRAPHY CHEST CT ABDOMEN AND PELVIS WITH CONTRAST TECHNIQUE: Multidetector CT imaging of the chest was performed using the standard protocol during bolus administration of intravenous contrast. Multiplanar CT image reconstructions and MIPs were obtained to evaluate the vascular anatomy. Multidetector CT imaging of the abdomen and pelvis was performed using the standard protocol during bolus administration of intravenous contrast. RADIATION DOSE REDUCTION: This exam was performed according to the departmental dose-optimization program which includes automated exposure control, adjustment of the mA and/or kV according to patient size and/or use of iterative reconstruction technique. CONTRAST:  159mL OMNIPAQUE IOHEXOL 350 MG/ML SOLN COMPARISON:  CT abdomen pelvis  07/01/2021, CT chest abdomen pelvis 04/16/2021. FINDINGS: CTA CHEST FINDINGS Cardiovascular: Satisfactory opacification of the pulmonary arteries to the segmental level. No evidence of pulmonary embolism. Normal heart size. No significant pericardial effusion. The thoracic aorta is normal in caliber. No atherosclerotic plaque of the thoracic aorta. No coronary artery calcifications. Mediastinum/Nodes: Interval decrease in size of conglomerative mediastinal lymphadenopathy with as an example Emonte Dieujuste subcarinal 2 x 5.5 cm lesion (from 5.9 x 3.1 cm). Interval decrease in hilar lymphadenopathy with as an example Sanjuan Sawa 1.8 cm (from 2.6 cm) right hilar lymph node (6:38). No axillary lymph nodes. Thyroid gland, trachea, and esophagus demonstrate no significant findings. Lungs/Pleura: Interval decrease in size of innumerable pulmonary metastases with as an example Meeyah Ovitt 3.3 x 2.6 cm (from 3.7 x 3.6 cm) left lower lobe (12:109) status as well as Lavell Ridings left upper lobe 1.8 x 2.1 cm (12:71) right upper lobe (from 2.2 x 2.1 cm). No pulmonary mass. Stable left pleural effusion. Redemonstration of interval decrease in size of associated left pleural nodularity/mass (6:81) now measuring 8.2 x 1.3 cm (from 8.7 x 2.3 cm). No pneumothorax. Musculoskeletal: No chest wall abnormality. No suspicious lytic or blastic osseous lesions. No acute displaced fracture. Interval decrease in size of Yul Diana left lateral fifth rib soft tissue metastasis now measuring 6.3 x 4.1 cm (from 6.8 x 4.9 cm) with associated persistent resorption/destruction of the lateral fifth rib. Review of the MIP images confirms the above findings. CT ABDOMEN and PELVIS FINDINGS Hepatobiliary: No focal liver abnormality. No gallstones, gallbladder wall thickening, or pericholecystic fluid. No biliary dilatation. Pancreas: No focal lesion. Query slightly hazy contour of the pancreatic head. Otherwise no surrounding inflammatory changes. No main pancreatic ductal dilatation. No pancreatic  pseudocyst formation. Spleen: Normal in size without focal abnormality. Adrenals/Urinary Tract: Grossly stable 6 x 2.4 cm (from 6 x 2.6 cm) left adrenal gland metastatic lesion (2:27). The right adrenal gland is normal with no nodularity. Bilateral kidneys enhance symmetrically. Redemonstration of Declyn Delsol grossly stable in size heterogeneous 12 x 10 cm right renal lesion. Redemonstration of several other subcentimeter hypodensities within bilateral kidneys. Interval decrease in size of Verlia Kaney heterogeneous 2.1 x 2.1 (from 2.2 x 2.3 cm) left renal lesion (2:37). No hydronephrosis. No hydroureter. The urinary bladder is unremarkable. Stomach/Bowel: Stomach is within normal limits. No evidence of bowel wall thickening or dilatation. The appendix not definitely identified. Vascular/Lymphatic: The inferior vena cava is patent. The hepatic, portal, splenic, superior mesenteric veins are patent. The right renal vein appears to be patent. The left renal vein appears to be patent. No abdominal aorta or iliac aneurysm. Mild atherosclerotic plaque of the aorta and its branches. No abdominal, pelvic, or inguinal lymphadenopathy. Reproductive: Prominent prostate. Other: No intraperitoneal free fluid. No  intraperitoneal free gas. No organized fluid collection. Musculoskeletal: No abdominal wall hernia or abnormality. No suspicious lytic or blastic osseous lesions. No acute displaced fracture. Multilevel degenerative changes of the spine. Bilateral sacroiliac joint degenerative changes. Review of the MIP images confirms the above findings. IMPRESSION: 1. No pulmonary embolus. 2. Query slightly hazy contour of the pancreatic head. Correlate with lipase levels for acute pancreatitis. 3. Grossly stable 12 cm right renal malignancy. 4. Interval decrease in size of multiple known metastases including bilateral pulmonary metastases, left pleural metastases, hilar and mediastinal lymphadenopathy, as well as Rayden Dock left chest wall metastasis involving  the left fifth rib. 5. Grossly stable left adrenal gland metastasis. 6. Grossly stable to possibly slightly decreased in size 2.1 cm left renal malignancy. Electronically Signed   By: Iven Finn M.D.   On: 08/28/2021 20:53   US Abdomen Limited RUQ (LIVER/GB)  Result Date: 08/29/2021 CLINICAL DATA:  Pancreatitis EXAM: ULTRASOUND ABDOMEN LIMITED RIGHT UPPER QUADRANT COMPARISON:  CT 08/28/2021 FINDINGS: Gallbladder: No gallstones or wall thickening visualized. No sonographic Murphy sign noted by sonographer. Common bile duct: Diameter: Obscured by bowel gas. No gross intrahepatic duct dilatation identified. No choledocholithiasis seen on recent CT. Liver: No focal lesion identified. Within normal limits in parenchymal echogenicity. Portal vein is patent on color Doppler imaging with normal direction of blood flow towards the liver. Other: None. IMPRESSION: No cholelithiasis. Obscured common duct, without biliary duct dilatation or choledocholithiasis on recent CT. Electronically Signed   By: Abigail Miyamoto M.D.   On: 08/29/2021 11:59        Scheduled Meds:  heparin  5,000 Units Subcutaneous Q8H   nirmatrelvir/ritonavir EUA  3 tablet Oral BID   pantoprazole  40 mg Oral Daily   Continuous Infusions:  lactated ringers 75 mL/hr at 08/30/21 0803     LOS: 1 day    Time spent: over 30 min    Fayrene Helper, MD [facility]   To contact the attending provider between 7A-7P or the covering provider during after hours 7P-7A, please log into the web site [email or web address] and access using universal [place] password for that web site. If you do not have the password, please call the hospital operator.  08/30/2021, 4:20 PM

## 2021-08-30 NOTE — Consult Note (Signed)
Referring Provider: Elodia Florence., MD Primary Care Physician:  System, Provider Not In Primary Gastroenterologist:  Unassigned  Reason for Consultation:  Recurrent pancreatitis  HPI: Dylan Good is a 51 y.o. male with history of metastatic kidney cancer who presented to the hospital 08/28/2021 for 1 day of abdominal pain and cough.  Patient was discharged from the hospital earlier this month following an episode of acute pancreatitis.  During last admission, it was thought that his pancreatitis may have been from oral chemo agent; however, patient states he has been out of this medication since discharge and has not had it refilled.  On arrival, temp 103, heart rate 120, blood pressure 119/78, O2 sat 100% room air. Labs on admission showed lipase of 200, no leukocytosis, hemoglobin 12.7, platelets 251  Patient has had multiple admissions for pancreatitis since December.   Patient resting comfortably in bed this morning.  He is on precautions, found incidentally to be COVID-positive.  Patient states his abdominal pain is better compared to previous episodes of pancreatitis, located to his epigastrium/LUQ.  He has been coughing due to COVID infection and states he vomited yesterday and noticed a very small amount of bright red blood, but this only occurred once.  Denies nausea or vomiting today.  On full liquid diet and tolerating well.    Denies diarrhea or constipation, states he normally has a bowel movement every other day but has not had one since admission.  Denies melena or hematochezia.  Patient smokes half a pack of cigarettes per day.  He has a history of alcohol use with occasional binge drinking/partying, but states he stopped drinking about a year ago when he was diagnosed with cancer.  Denies family history of GI malignancy or disease.  Denies MI/stroke history, denies blood thinners, though he is currently on heparin for DVT prophylaxis.  EGD 07/03/2021 with Dr. Therisa Doyne for  epigastric pain, nausea/vomiting - Normal  No prior colonoscopy.  Past Medical History:  Diagnosis Date   Cancer, metastatic to lung [facility])    Hypertension 07/01/2021   Renal cell adenocarcinoma, left [facility])     Past Surgical History:  Procedure Laterality Date   APPENDECTOMY     BIOPSY  07/03/2021   Procedure: BIOPSY;  Surgeon: Ronnette Juniper, MD;  Location: [facility];  Service: Gastroenterology;;   CHEST TUBE INSERTION Left 02/06/2021   Procedure: INSERTION PLEURAL DRAINAGE CATHETER;  Surgeon: Garner Nash, DO;  Location: [place];  Service: Pulmonary;  Laterality: Left;   CHEST TUBE INSERTION N/A 04/04/2021   Procedure: removal of pleurex cath;  Surgeon: Garner Nash, DO;  Location: [place];  Service: Pulmonary;  Laterality: N/A;   ESOPHAGOGASTRODUODENOSCOPY (EGD) WITH PROPOFOL N/A 07/03/2021   Procedure: ESOPHAGOGASTRODUODENOSCOPY (EGD) WITH PROPOFOL;  Surgeon: Ronnette Juniper, MD;  Location: [facility];  Service: Gastroenterology;  Laterality: N/A;    Prior to Admission medications   Medication Sig Start Date End Date Taking? Authorizing Provider  amLODipine (NORVASC) 5 MG tablet TAKE 1 TABLET (5 MG TOTAL) BY MOUTH DAILY. 07/17/21  Yes [name], MD  nicotine (NICODERM CQ - DOSED IN MG/24 HOURS) 14 mg/24hr patch Place 1 patch (14 mg total) onto the skin daily. 08/15/21  Yes Eugenie Filler, MD  ondansetron (ZOFRAN) 4 MG tablet Take 1 tablet (4 mg total) by mouth every 6 (six) hours as needed for nausea. 08/14/21  Yes Eugenie Filler, MD  pantoprazole (PROTONIX) 40 MG tablet Take 1 tablet (40 mg total) by mouth daily.  08/14/21  Yes Eugenie Filler, MD  diclofenac Sodium (VOLTAREN) 1 % GEL Apply 2 g topically 4 (four) times daily. Patient not taking: Reported on 08/28/2021 06/21/21   Raiford Noble Latif, DO  docusate sodium (COLACE) 100 MG capsule Take 1 capsule (100 mg total) by mouth 2 (two) times daily. Patient not taking: Reported on 08/12/2021 06/21/21    Raiford Noble Latif, DO  oxyCODONE-acetaminophen (PERCOCET/ROXICET) 5-325 MG tablet Take 1 tablet by mouth every 4 (four) hours as needed for moderate pain or severe pain. Patient not taking: Reported on 08/28/2021 07/06/21   Georgette Shell, MD  polyethylene glycol (MIRALAX / GLYCOLAX) 17 g packet Take 17 g by mouth 2 (two) times daily. Patient not taking: Reported on 08/12/2021 07/06/21   Georgette Shell, MD    Scheduled Meds:  heparin  5,000 Units Subcutaneous Q8H   pantoprazole  40 mg Oral Daily   Continuous Infusions:  lactated ringers 75 mL/hr at 08/30/21 0803   PRN Meds:.acetaminophen **OR** acetaminophen, morphine injection, ondansetron **OR** ondansetron (ZOFRAN) IV  Allergies as of 08/28/2021   (No Known Allergies)    Family History  Problem Relation Age of Onset   Lupus Mother    Cancer Other     Social History   Socioeconomic History   Marital status: Single    Spouse name: Not on file   Number of children: Not on file   Years of education: Not on file   Highest education level: Not on file  Occupational History   Not on file  Tobacco Use   Smoking status: Some Days    Types: Cigarettes   Smokeless tobacco: Never  Vaping Use   Vaping Use: Never used  Substance and Sexual Activity   Alcohol use: Never   Drug use: Never   Sexual activity: Not on file  Other Topics Concern   Not on file  Social History Narrative   Not on file   Social Determinants of Health   Financial Resource Strain: Not on file  Food Insecurity: Not on file  Transportation Needs: Not on file  Physical Activity: Not on file  Stress: Not on file  Social Connections: Not on file  Intimate Partner Violence: Not on file    Review of Systems: Review of Systems  Constitutional:  Positive for fever. Negative for chills.  HENT:  Negative for sinus pain.   Eyes:  Negative for discharge and redness.  Respiratory:  Positive for cough. Negative for wheezing and stridor.    Cardiovascular:  Negative for chest pain and leg swelling.  Gastrointestinal:  Positive for abdominal pain, nausea and vomiting. Negative for heartburn.  Genitourinary:  Negative for dysuria and urgency.  Musculoskeletal:  Negative for falls and joint pain.  Skin:  Negative for itching and rash.  Neurological:  Negative for seizures and loss of consciousness.  Endo/Heme/Allergies:  Negative for environmental allergies. Does not bruise/bleed easily.  Psychiatric/Behavioral:  Negative for memory loss. The patient is not nervous/anxious.     Physical Exam: Physical Exam Constitutional:      General: He is not in acute distress.    Appearance: Normal appearance.  HENT:     Head: Normocephalic and atraumatic.     Right Ear: External ear normal.     Left Ear: External ear normal.     Nose: Nose normal.     Mouth/Throat:     Mouth: Mucous membranes are moist.     Pharynx: Oropharynx is clear.  Eyes:  General: No scleral icterus.    Extraocular Movements: Extraocular movements intact.     Conjunctiva/sclera: Conjunctivae normal.  Cardiovascular:     Rate and Rhythm: Normal rate and regular rhythm.     Pulses: Normal pulses.     Heart sounds: Normal heart sounds.  Pulmonary:     Effort: Pulmonary effort is normal.     Breath sounds: Normal breath sounds.  Abdominal:     General: Bowel sounds are normal. There is no distension.     Palpations: Abdomen is soft.     Tenderness: There is no abdominal tenderness.  Musculoskeletal:     Cervical back: Normal range of motion and neck supple.     Right lower leg: No edema.     Left lower leg: No edema.  Skin:    General: Skin is warm and dry.  Neurological:     General: No focal deficit present.     Mental Status: He is alert and oriented to person, place, and time.  Psychiatric:        Mood and Affect: Mood normal.        Behavior: Behavior normal.     Vital signs: Vitals:   08/29/21 2131 08/30/21 0518  BP: 127/81 134/75   Pulse: 100 (!) 102  Resp: 18 18  Temp: 99.4 F (37.4 C) 100.3 F (37.9 C)  SpO2: 97% 97%   Last BM Date : 08/29/21    GI:  Lab Results: Recent Labs    08/28/21 1930 08/29/21 0442 08/30/21 0433  WBC 3.4* 2.6* 2.4*  HGB 12.7* 11.7* 11.5*  HCT 40.0 37.7* 36.6*  PLT 251 212 209   BMET Recent Labs    08/28/21 1930 08/29/21 0442 08/30/21 0433  NA 133* 133* 130*  K 3.5 3.6 3.6  CL 103 105 103  CO2 22 22 19*  GLUCOSE 97 83 70  BUN $Re'8 6 6  'iaw$ CREATININE 0.86 0.72 0.68  CALCIUM 8.7* 8.0* 7.8*   LFT Recent Labs    08/30/21 0433  PROT 6.6  ALBUMIN 3.1*  AST 16  ALT 11  ALKPHOS 51  BILITOT 0.9   PT/INR Recent Labs    08/28/21 1930  LABPROT 14.6  INR 1.1     Studies/Results: DG Chest 2 View  Result Date: 08/28/2021 CLINICAL DATA:  Cough and fever.  Metastatic renal carcinoma. EXAM: CHEST - 2 VIEW COMPARISON:  04/04/2021 FINDINGS: Multiple bilateral pulmonary nodules are best seen on prior CT. Lung nodules the right appear mildly improved from the prior study. Decreased nodularity and infiltrate in the left lower lobe. Large extrapleural mass and rib destruction involving the left fifth rib laterally. This is similar to the prior study. Small left pleural effusion with mild interval improvement. Right paratracheal adenopathy similar to the prior study. IMPRESSION: Negative for acute pneumonia Metastatic disease in the chest. Several lung nodules appear smaller. Improved aeration left lower lobe.  Decreased left effusion. Electronically Signed   By: Franchot Gallo M.D.   On: 08/28/2021 19:08   CT Angio Chest PE W and/or Wo Contrast  Result Date: 08/28/2021 CLINICAL DATA:  Pulmonary embolism (PE) suspected, high prob; Sepsis. Patient with left-sided chest, rib, flank pain. Patient has history of metastatic cancer. He did not know he had a fever until he arrived today. Has cough which is chronic. Has had multiple episodes of emesis. Recently discharged for pancreatitis.  Metastatic urologic cancer. EXAM: CT ANGIOGRAPHY CHEST CT ABDOMEN AND PELVIS WITH CONTRAST TECHNIQUE: Multidetector CT imaging of the  chest was performed using the standard protocol during bolus administration of intravenous contrast. Multiplanar CT image reconstructions and MIPs were obtained to evaluate the vascular anatomy. Multidetector CT imaging of the abdomen and pelvis was performed using the standard protocol during bolus administration of intravenous contrast. RADIATION DOSE REDUCTION: This exam was performed according to the departmental dose-optimization program which includes automated exposure control, adjustment of the mA and/or kV according to patient size and/or use of iterative reconstruction technique. CONTRAST:  122mL OMNIPAQUE IOHEXOL 350 MG/ML SOLN COMPARISON:  CT abdomen pelvis 07/01/2021, CT chest abdomen pelvis 04/16/2021. FINDINGS: CTA CHEST FINDINGS Cardiovascular: Satisfactory opacification of the pulmonary arteries to the segmental level. No evidence of pulmonary embolism. Normal heart size. No significant pericardial effusion. The thoracic aorta is normal in caliber. No atherosclerotic plaque of the thoracic aorta. No coronary artery calcifications. Mediastinum/Nodes: Interval decrease in size of conglomerative mediastinal lymphadenopathy with as an example a subcarinal 2 x 5.5 cm lesion (from 5.9 x 3.1 cm). Interval decrease in hilar lymphadenopathy with as an example a 1.8 cm (from 2.6 cm) right hilar lymph node (6:38). No axillary lymph nodes. Thyroid gland, trachea, and esophagus demonstrate no significant findings. Lungs/Pleura: Interval decrease in size of innumerable pulmonary metastases with as an example a 3.3 x 2.6 cm (from 3.7 x 3.6 cm) left lower lobe (12:109) status as well as a left upper lobe 1.8 x 2.1 cm (12:71) right upper lobe (from 2.2 x 2.1 cm). No pulmonary mass. Stable left pleural effusion. Redemonstration of interval decrease in size of associated left pleural  nodularity/mass (6:81) now measuring 8.2 x 1.3 cm (from 8.7 x 2.3 cm). No pneumothorax. Musculoskeletal: No chest wall abnormality. No suspicious lytic or blastic osseous lesions. No acute displaced fracture. Interval decrease in size of a left lateral fifth rib soft tissue metastasis now measuring 6.3 x 4.1 cm (from 6.8 x 4.9 cm) with associated persistent resorption/destruction of the lateral fifth rib. Review of the MIP images confirms the above findings. CT ABDOMEN and PELVIS FINDINGS Hepatobiliary: No focal liver abnormality. No gallstones, gallbladder wall thickening, or pericholecystic fluid. No biliary dilatation. Pancreas: No focal lesion. Query slightly hazy contour of the pancreatic head. Otherwise no surrounding inflammatory changes. No main pancreatic ductal dilatation. No pancreatic pseudocyst formation. Spleen: Normal in size without focal abnormality. Adrenals/Urinary Tract: Grossly stable 6 x 2.4 cm (from 6 x 2.6 cm) left adrenal gland metastatic lesion (2:27). The right adrenal gland is normal with no nodularity. Bilateral kidneys enhance symmetrically. Redemonstration of a grossly stable in size heterogeneous 12 x 10 cm right renal lesion. Redemonstration of several other subcentimeter hypodensities within bilateral kidneys. Interval decrease in size of a heterogeneous 2.1 x 2.1 (from 2.2 x 2.3 cm) left renal lesion (2:37). No hydronephrosis. No hydroureter. The urinary bladder is unremarkable. Stomach/Bowel: Stomach is within normal limits. No evidence of bowel wall thickening or dilatation. The appendix not definitely identified. Vascular/Lymphatic: The inferior vena cava is patent. The hepatic, portal, splenic, superior mesenteric veins are patent. The right renal vein appears to be patent. The left renal vein appears to be patent. No abdominal aorta or iliac aneurysm. Mild atherosclerotic plaque of the aorta and its branches. No abdominal, pelvic, or inguinal lymphadenopathy. Reproductive:  Prominent prostate. Other: No intraperitoneal free fluid. No intraperitoneal free gas. No organized fluid collection. Musculoskeletal: No abdominal wall hernia or abnormality. No suspicious lytic or blastic osseous lesions. No acute displaced fracture. Multilevel degenerative changes of the spine. Bilateral sacroiliac joint degenerative changes. Review of the  MIP images confirms the above findings. IMPRESSION: 1. No pulmonary embolus. 2. Query slightly hazy contour of the pancreatic head. Correlate with lipase levels for acute pancreatitis. 3. Grossly stable 12 cm right renal malignancy. 4. Interval decrease in size of multiple known metastases including bilateral pulmonary metastases, left pleural metastases, hilar and mediastinal lymphadenopathy, as well as a left chest wall metastasis involving the left fifth rib. 5. Grossly stable left adrenal gland metastasis. 6. Grossly stable to possibly slightly decreased in size 2.1 cm left renal malignancy. Electronically Signed   By: Iven Finn M.D.   On: 08/28/2021 20:53   CT ABDOMEN PELVIS W CONTRAST  Result Date: 08/28/2021 CLINICAL DATA:  Pulmonary embolism (PE) suspected, high prob; Sepsis. Patient with left-sided chest, rib, flank pain. Patient has history of metastatic cancer. He did not know he had a fever until he arrived today. Has cough which is chronic. Has had multiple episodes of emesis. Recently discharged for pancreatitis. Metastatic urologic cancer. EXAM: CT ANGIOGRAPHY CHEST CT ABDOMEN AND PELVIS WITH CONTRAST TECHNIQUE: Multidetector CT imaging of the chest was performed using the standard protocol during bolus administration of intravenous contrast. Multiplanar CT image reconstructions and MIPs were obtained to evaluate the vascular anatomy. Multidetector CT imaging of the abdomen and pelvis was performed using the standard protocol during bolus administration of intravenous contrast. RADIATION DOSE REDUCTION: This exam was performed according  to the departmental dose-optimization program which includes automated exposure control, adjustment of the mA and/or kV according to patient size and/or use of iterative reconstruction technique. CONTRAST:  130mL OMNIPAQUE IOHEXOL 350 MG/ML SOLN COMPARISON:  CT abdomen pelvis 07/01/2021, CT chest abdomen pelvis 04/16/2021. FINDINGS: CTA CHEST FINDINGS Cardiovascular: Satisfactory opacification of the pulmonary arteries to the segmental level. No evidence of pulmonary embolism. Normal heart size. No significant pericardial effusion. The thoracic aorta is normal in caliber. No atherosclerotic plaque of the thoracic aorta. No coronary artery calcifications. Mediastinum/Nodes: Interval decrease in size of conglomerative mediastinal lymphadenopathy with as an example a subcarinal 2 x 5.5 cm lesion (from 5.9 x 3.1 cm). Interval decrease in hilar lymphadenopathy with as an example a 1.8 cm (from 2.6 cm) right hilar lymph node (6:38). No axillary lymph nodes. Thyroid gland, trachea, and esophagus demonstrate no significant findings. Lungs/Pleura: Interval decrease in size of innumerable pulmonary metastases with as an example a 3.3 x 2.6 cm (from 3.7 x 3.6 cm) left lower lobe (12:109) status as well as a left upper lobe 1.8 x 2.1 cm (12:71) right upper lobe (from 2.2 x 2.1 cm). No pulmonary mass. Stable left pleural effusion. Redemonstration of interval decrease in size of associated left pleural nodularity/mass (6:81) now measuring 8.2 x 1.3 cm (from 8.7 x 2.3 cm). No pneumothorax. Musculoskeletal: No chest wall abnormality. No suspicious lytic or blastic osseous lesions. No acute displaced fracture. Interval decrease in size of a left lateral fifth rib soft tissue metastasis now measuring 6.3 x 4.1 cm (from 6.8 x 4.9 cm) with associated persistent resorption/destruction of the lateral fifth rib. Review of the MIP images confirms the above findings. CT ABDOMEN and PELVIS FINDINGS Hepatobiliary: No focal liver abnormality.  No gallstones, gallbladder wall thickening, or pericholecystic fluid. No biliary dilatation. Pancreas: No focal lesion. Query slightly hazy contour of the pancreatic head. Otherwise no surrounding inflammatory changes. No main pancreatic ductal dilatation. No pancreatic pseudocyst formation. Spleen: Normal in size without focal abnormality. Adrenals/Urinary Tract: Grossly stable 6 x 2.4 cm (from 6 x 2.6 cm) left adrenal gland metastatic lesion (2:27). The right  adrenal gland is normal with no nodularity. Bilateral kidneys enhance symmetrically. Redemonstration of a grossly stable in size heterogeneous 12 x 10 cm right renal lesion. Redemonstration of several other subcentimeter hypodensities within bilateral kidneys. Interval decrease in size of a heterogeneous 2.1 x 2.1 (from 2.2 x 2.3 cm) left renal lesion (2:37). No hydronephrosis. No hydroureter. The urinary bladder is unremarkable. Stomach/Bowel: Stomach is within normal limits. No evidence of bowel wall thickening or dilatation. The appendix not definitely identified. Vascular/Lymphatic: The inferior vena cava is patent. The hepatic, portal, splenic, superior mesenteric veins are patent. The right renal vein appears to be patent. The left renal vein appears to be patent. No abdominal aorta or iliac aneurysm. Mild atherosclerotic plaque of the aorta and its branches. No abdominal, pelvic, or inguinal lymphadenopathy. Reproductive: Prominent prostate. Other: No intraperitoneal free fluid. No intraperitoneal free gas. No organized fluid collection. Musculoskeletal: No abdominal wall hernia or abnormality. No suspicious lytic or blastic osseous lesions. No acute displaced fracture. Multilevel degenerative changes of the spine. Bilateral sacroiliac joint degenerative changes. Review of the MIP images confirms the above findings. IMPRESSION: 1. No pulmonary embolus. 2. Query slightly hazy contour of the pancreatic head. Correlate with lipase levels for acute  pancreatitis. 3. Grossly stable 12 cm right renal malignancy. 4. Interval decrease in size of multiple known metastases including bilateral pulmonary metastases, left pleural metastases, hilar and mediastinal lymphadenopathy, as well as a left chest wall metastasis involving the left fifth rib. 5. Grossly stable left adrenal gland metastasis. 6. Grossly stable to possibly slightly decreased in size 2.1 cm left renal malignancy. Electronically Signed   By: Iven Finn M.D.   On: 08/28/2021 20:53   US Abdomen Limited RUQ (LIVER/GB)  Result Date: 08/29/2021 CLINICAL DATA:  Pancreatitis EXAM: ULTRASOUND ABDOMEN LIMITED RIGHT UPPER QUADRANT COMPARISON:  CT 08/28/2021 FINDINGS: Gallbladder: No gallstones or wall thickening visualized. No sonographic Murphy sign noted by sonographer. Common bile duct: Diameter: Obscured by bowel gas. No gross intrahepatic duct dilatation identified. No choledocholithiasis seen on recent CT. Liver: No focal lesion identified. Within normal limits in parenchymal echogenicity. Portal vein is patent on color Doppler imaging with normal direction of blood flow towards the liver. Other: None. IMPRESSION: No cholelithiasis. Obscured common duct, without biliary duct dilatation or choledocholithiasis on recent CT. Electronically Signed   By: Abigail Miyamoto M.D.   On: 08/29/2021 11:59    Impression: Acute recurrent pancreatitis - CT 08/28/2021 shows hazy contour of the pancreatic head, stable 12 cm right renal malignancy, decrease in size of multiple known mets. - Lipase 119 (200 on admission) -Multiple recent admissions for pancreatitis -RUQ [place] 08/29/2021 without cholelithiasis -Triglycerides 47 - Denies alcohol in the last year - IgG4 pending - AST 16, ALT 11, alk phos 51, T. bili 0.9 - BUN 6, creatinine 0.68 - WBC 2.4, hemoglobin 11.5 (12.7 on admission)  COVID-19 infection - Likely cause of fever at presentation (89 F) - Primary team is discussing paxlovid  Metastatic  kidney cancer - Mets to brain and lung - Was on oral chemo agent (Cometriq) but has not received since last hospitalization   Plan: Continue IV fluids. Continue full liquid diet as tolerated. Continue pain management as needed. [facility] will follow.    LOS: 1 day   Angelique Holm  PA-C 08/30/2021, 9:58 AM  Contact #  [phone]

## 2021-08-31 DIAGNOSIS — D709 Neutropenia, unspecified: Secondary | ICD-10-CM

## 2021-08-31 DIAGNOSIS — K859 Acute pancreatitis without necrosis or infection, unspecified: Secondary | ICD-10-CM | POA: Diagnosis not present

## 2021-08-31 LAB — CBC WITH DIFFERENTIAL/PLATELET
Abs Immature Granulocytes: 0 10*3/uL (ref 0.00–0.07)
Basophils Absolute: 0 10*3/uL (ref 0.0–0.1)
Basophils Relative: 1 %
Eosinophils Absolute: 0.1 10*3/uL (ref 0.0–0.5)
Eosinophils Relative: 5 %
HCT: 36.4 % — ABNORMAL LOW (ref 39.0–52.0)
Hemoglobin: 11.4 g/dL — ABNORMAL LOW (ref 13.0–17.0)
Immature Granulocytes: 0 %
Lymphocytes Relative: 42 %
Lymphs Abs: 0.7 10*3/uL (ref 0.7–4.0)
MCH: 24.7 pg — ABNORMAL LOW (ref 26.0–34.0)
MCHC: 31.3 g/dL (ref 30.0–36.0)
MCV: 79 fL — ABNORMAL LOW (ref 80.0–100.0)
Monocytes Absolute: 0.3 10*3/uL (ref 0.1–1.0)
Monocytes Relative: 16 %
Neutro Abs: 0.6 10*3/uL — ABNORMAL LOW (ref 1.7–7.7)
Neutrophils Relative %: 36 %
Platelets: 197 10*3/uL (ref 150–400)
RBC: 4.61 MIL/uL (ref 4.22–5.81)
RDW: 16 % — ABNORMAL HIGH (ref 11.5–15.5)
WBC: 1.7 10*3/uL — ABNORMAL LOW (ref 4.0–10.5)
nRBC: 0 % (ref 0.0–0.2)

## 2021-08-31 LAB — COMPREHENSIVE METABOLIC PANEL
ALT: 11 U/L (ref 0–44)
AST: 15 U/L (ref 15–41)
Albumin: 3.4 g/dL — ABNORMAL LOW (ref 3.5–5.0)
Alkaline Phosphatase: 53 U/L (ref 38–126)
Anion gap: 6 (ref 5–15)
BUN: <5 mg/dL — ABNORMAL LOW (ref 6–20)
CO2: 25 mmol/L (ref 22–32)
Calcium: 8.7 mg/dL — ABNORMAL LOW (ref 8.9–10.3)
Chloride: 104 mmol/L (ref 98–111)
Creatinine, Ser: 0.66 mg/dL (ref 0.61–1.24)
GFR, Estimated: >60 mL/min (ref >60)
Glucose, Bld: 86 mg/dL (ref 70–99)
Potassium: 4 mmol/L (ref 3.5–5.1)
Sodium: 135 mmol/L (ref 135–145)
Total Bilirubin: 0.8 mg/dL (ref 0.3–1.2)
Total Protein: 6.9 g/dL (ref 6.5–8.1)

## 2021-08-31 LAB — PHOSPHORUS: Phosphorus: 2.8 mg/dL (ref 2.5–4.6)

## 2021-08-31 LAB — MAGNESIUM: Magnesium: 2.1 mg/dL (ref 1.7–2.4)

## 2021-08-31 MED ORDER — LACTATED RINGERS IV SOLN: INTRAVENOUS | Status: AC

## 2021-08-31 NOTE — Assessment & Plan Note (Addendum)
Stable, follow outpatient

## 2021-08-31 NOTE — Progress Notes (Signed)
PROGRESS NOTE    Dylan Good  IOE:[id number] DOB: 1970/07/11 DOA: 08/28/2021 PCP: System, Provider Not In  Chief Complaint  Patient presents with   Nausea   Emesis    Brief Narrative:  51 yo with hx metastatic kidney cancer, hypertension, and multiple other medical problems who presented with recurrent pancreatitis.  Also incidentally covid positive.  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 site [facility])   Neutropenia ([place])   Brain metastases (HCC)   Pleural effusion on left   Hypertension   Lung metastases (HCC)   Pancreatitis   Assessment and Plan: * Acute pancreatitis- (present on admission) Lipase peaked to 200 CT with hazy contour of pancreatic head, concerning for pancreatitis He's had multiple recent admissions for pancreatitis At last discharge, he was instructed to hold his oral chemo with concern that chemo meds maybe contributing (cabometyx), but he's had recurrence despite that RUQ [place] without cholelithiasis Triglycerides 47 IgG4 wnl Denies etoh use I don't see obvious contributing meds Vomited today, will back diet to clears and continue IVF and pain meds Gi c/s given recurrent pancreatitis, appreciate recommendations  COVID-19 virus infection This likely explains fever at presentation to 17 F He's higher risk for complications with his metastatic cancer Start paxlovid, discussed risks/benefits (holding amlodipine)  Neutropenia (HCC) Trend, follow - he's afebrile   Kidney cancer, primary, with metastasis from kidney to other site [facility])- (present on admission) He's not taking oral chemo right now (with concern that it was causing his recurrent pancreatitis) Follows outpatient with [name]  Brain metastases [facility])- (present on admission) noted  Lung metastases ([place])- (present on  admission) noted  Hypertension- (present on admission) Hold norvasc, now on paxlovid  Pleural effusion on left- (present on admission) Noted    DVT prophylaxis: heparin Code Status: full Family Communication: none  Disposition:   Status is: Observation The patient will require care spanning > 2 midnights and should be moved to inpatient because: recurrent pancreatitis, covid  Consultants:  GI  Procedures:  none  Antimicrobials:  Anti-infectives (From admission, onward)    Start     Dose/Rate Route Frequency Ordered Stop   08/30/21 1300  nirmatrelvir/ritonavir EUA (PAXLOVID) 3 tablet        3 tablet Oral 2 times daily 08/30/21 1211 09/04/21 0959       Subjective: No new complaints Discussed advancing diet before RN told me he threw up after my interview  Objective: Vitals:   08/30/21 1336 08/30/21 2158 08/31/21 0515 08/31/21 1235  BP: 132/76 119/77 108/70 108/79  Pulse: 87 79 79 80  Resp: 16 16 16 18   Temp: 98 F (36.7 C) 98.5 F (36.9 C) 98.9 F (37.2 C) 97.8 F (36.6 C)  TempSrc:  Oral Oral Oral  SpO2: 99% 99% 100% 99%  Weight:   70.6 kg   Height:        Intake/Output Summary (Last 24 hours) at 08/31/2021 1809 Last data filed at 08/31/2021 1700 Gross per 24 hour  Intake 1080 ml  Output 2825 ml  Net -1745 ml   Filed Weights   08/29/21 0500 08/30/21 0500 08/31/21 0515  Weight: 73 kg 73.1 kg 70.6 kg    Examination:  General: No acute distress. Cardiovascular: RRR Lungs: unlabored Abdomen: Soft, nontender, nondistended  Neurological: Alert and oriented 3. Moves all extremities 4 .  Cranial nerves II through XII grossly intact. Skin: Warm and dry. No rashes or lesions. Extremities: No clubbing or cyanosis. No edema.   Data Reviewed: I have personally reviewed following labs and imaging studies  CBC: Recent Labs  Lab 08/28/21 1930 08/29/21 0442 08/30/21 0433 08/31/21 0439  WBC 3.4* 2.6* 2.4* 1.7*  NEUTROABS 2.2 1.2* 1.1* 0.6*  HGB  12.7* 11.7* 11.5* 11.4*  HCT 40.0 37.7* 36.6* 36.4*  MCV 77.8* 79.4* 79.2* 79.0*  PLT 251 212 209 086    Basic Metabolic Panel: Recent Labs  Lab 08/28/21 1930 08/29/21 0442 08/30/21 0433 08/31/21 0439  NA 133* 133* 130* 135  K 3.5 3.6 3.6 4.0  CL 103 105 103 104  CO2 22 22 19* 25  GLUCOSE 97 83 70 86  BUN 8 6 6  <5*  CREATININE 0.86 0.72 0.68 0.66  CALCIUM 8.7* 8.0* 7.8* 8.7*  MG  --   --  1.6* 2.1  PHOS  --   --  2.2* 2.8    GFR: Estimated Creatinine Clearance: 112.8 mL/min (by C-G formula based on SCr of 0.66 mg/dL).  Liver Function Tests: Recent Labs  Lab 08/28/21 1930 08/29/21 0442 08/30/21 0433 08/31/21 0439  AST 16 13* 16 15  ALT 12 11 11 11   ALKPHOS 71 62 51 53  BILITOT 1.0 0.8 0.9 0.8  PROT 7.8 6.5 6.6 6.9  ALBUMIN 3.8 3.2* 3.1* 3.4*    CBG: No results for input(s): GLUCAP in the last 168 hours.   Recent Results (from the past 240 hour(s))  Blood culture (routine x 2)     Status: None (Preliminary result)   Collection Time: 08/28/21  7:30 PM   Specimen: BLOOD  Result Value Ref Range Status   Specimen Description   Final    BLOOD RIGHT ANTECUBITAL Performed at [place] [address] [place], [place] [address]    Special Requests   Final    BOTTLES DRAWN AEROBIC AND ANAEROBIC Blood Culture adequate volume Performed at [place] [address], [place], [place] [address]    Culture   Final    NO GROWTH 3 DAYS Performed at [facility], [place] [address] [place],  [address]    Report Status PENDING  Incomplete  Resp Panel by RT-PCR (Flu Rayaan Lorah&B, Covid)     Status: Abnormal   Collection Time: 08/28/21  7:33 PM   Specimen: Nasopharyngeal(NP) swabs in vial transport medium  Result Value Ref Range Status   SARS Coronavirus 2 by RT PCR POSITIVE (Eletha Culbertson) NEGATIVE Final    Comment: (NOTE) SARS-CoV-2 target nucleic acids are DETECTED.  The SARS-CoV-2 RNA is generally detectable in upper  respiratory specimens during the acute phase of infection. Positive results are indicative of the presence of the identified virus, but do not rule out bacterial infection or co-infection with other pathogens not detected by the test. Clinical correlation with patient history and other diagnostic information is necessary to determine patient infection status. The expected result is Negative.  Fact Sheet for Patients: EntrepreneurPulse.com.au  Fact Sheet for Healthcare Providers: IncredibleEmployment.be  This test is not yet approved or cleared by the [place] FDA and  has been authorized for detection and/or diagnosis of SARS-CoV-2 by FDA under an Emergency Use Authorization (EUA).  This EUA will remain in effect (meaning this test can be used) for the duration of  the COVID-19 declaration under Section 564(b)(1) of the [place] ct, 21 U.S.C. section 360bbb-3(b)(1), unless the authorization is  terminated or revoked sooner.     Influenza Effie Janoski by PCR NEGATIVE NEGATIVE Final   Influenza B by PCR NEGATIVE NEGATIVE Final    Comment: (NOTE) The Xpert Xpress SARS-CoV-2/FLU/RSV plus assay is intended as an aid in the diagnosis of influenza from Nasopharyngeal swab specimens and should not be used as Sarahanne Novakowski sole basis for treatment. Nasal washings and aspirates are unacceptable for Xpert Xpress SARS-CoV-2/FLU/RSV testing.  Fact Sheet for Patients: EntrepreneurPulse.com.au  Fact Sheet for Healthcare Providers: IncredibleEmployment.be  This test is not yet approved or cleared by the [place] FDA and has been authorized for detection and/or diagnosis of SARS-CoV-2 by FDA under an Emergency Use Authorization (EUA). This EUA will remain in effect (meaning this test can be used) for the duration of the COVID-19 declaration under Section 564(b)(1) of the Act, 21 U.S.C. section 360bbb-3(b)(1), unless the authorization is  terminated or revoked.  Performed at [facility], [place] [address] [place], [place] [address]   Blood culture (routine x 2)     Status: None (Preliminary result)   Collection Time: 08/28/21  7:45 PM   Specimen: BLOOD  Result Value Ref Range Status   Specimen Description   Final    BLOOD LEFT ANTECUBITAL Performed at [place] [address] [place], [place] [address]    Special Requests   Final    BOTTLES DRAWN AEROBIC AND ANAEROBIC Blood Culture adequate volume Performed at [place] [address] [place], [place] [address]    Culture   Final    NO GROWTH 3 DAYS Performed at [facility], [place] [address] [place], [place] [address]    Report Status PENDING  Incomplete         Radiology Studies: No results found.      Scheduled Meds:  heparin  5,000 Units Subcutaneous Q8H   nirmatrelvir/ritonavir EUA  3 tablet Oral BID   pantoprazole  40 mg Oral Daily   Continuous Infusions:  lactated ringers       LOS: 2 days    Time spent: over 30 min    Fayrene Helper, MD [facility]   To contact the attending provider between 7A-7P or the covering provider during after hours 7P-7A, please log into the web site [email or web address] and access using universal [place] password for that web site. If you do not have the password, please call the hospital operator.  08/31/2021, 6:09 PM

## 2021-09-01 DIAGNOSIS — K859 Acute pancreatitis without necrosis or infection, unspecified: Secondary | ICD-10-CM | POA: Diagnosis not present

## 2021-09-01 LAB — COMPREHENSIVE METABOLIC PANEL
ALT: 9 U/L (ref 0–44)
AST: 14 U/L — ABNORMAL LOW (ref 15–41)
Albumin: 3.2 g/dL — ABNORMAL LOW (ref 3.5–5.0)
Alkaline Phosphatase: 48 U/L (ref 38–126)
Anion gap: 9 (ref 5–15)
BUN: <5 mg/dL — ABNORMAL LOW (ref 6–20)
CO2: 21 mmol/L — ABNORMAL LOW (ref 22–32)
Calcium: 8.8 mg/dL — ABNORMAL LOW (ref 8.9–10.3)
Chloride: 101 mmol/L (ref 98–111)
Creatinine, Ser: 0.51 mg/dL — ABNORMAL LOW (ref 0.61–1.24)
GFR, Estimated: >60 mL/min (ref >60)
Glucose, Bld: 78 mg/dL (ref 70–99)
Potassium: 3.8 mmol/L (ref 3.5–5.1)
Sodium: 131 mmol/L — ABNORMAL LOW (ref 135–145)
Total Bilirubin: 0.6 mg/dL (ref 0.3–1.2)
Total Protein: 6.6 g/dL (ref 6.5–8.1)

## 2021-09-01 LAB — CBC WITH DIFFERENTIAL/PLATELET
Abs Immature Granulocytes: 0.01 10*3/uL (ref 0.00–0.07)
Basophils Absolute: 0 10*3/uL (ref 0.0–0.1)
Basophils Relative: 1 %
Eosinophils Absolute: 0.1 10*3/uL (ref 0.0–0.5)
Eosinophils Relative: 5 %
HCT: 36.5 % — ABNORMAL LOW (ref 39.0–52.0)
Hemoglobin: 11.3 g/dL — ABNORMAL LOW (ref 13.0–17.0)
Immature Granulocytes: 1 %
Lymphocytes Relative: 37 %
Lymphs Abs: 0.8 10*3/uL (ref 0.7–4.0)
MCH: 25.4 pg — ABNORMAL LOW (ref 26.0–34.0)
MCHC: 31 g/dL (ref 30.0–36.0)
MCV: 82 fL (ref 80.0–100.0)
Monocytes Absolute: 0.3 10*3/uL (ref 0.1–1.0)
Monocytes Relative: 13 %
Neutro Abs: 1 10*3/uL — ABNORMAL LOW (ref 1.7–7.7)
Neutrophils Relative %: 43 %
Platelets: 193 10*3/uL (ref 150–400)
RBC: 4.45 MIL/uL (ref 4.22–5.81)
RDW: 16 % — ABNORMAL HIGH (ref 11.5–15.5)
WBC: 2.2 10*3/uL — ABNORMAL LOW (ref 4.0–10.5)
nRBC: 0 % (ref 0.0–0.2)

## 2021-09-01 LAB — MAGNESIUM: Magnesium: 1.7 mg/dL (ref 1.7–2.4)

## 2021-09-01 LAB — PHOSPHORUS: Phosphorus: 3 mg/dL (ref 2.5–4.6)

## 2021-09-01 LAB — LIPASE, BLOOD: Lipase: 55 U/L — ABNORMAL HIGH (ref 11–51)

## 2021-09-01 MED ORDER — OXYCODONE HCL 5 MG PO TABS
5.0000 mg | ORAL_TABLET | ORAL | Status: DC | PRN
  Administered 2021-09-01 – 2021-09-02 (×3): 5 mg via ORAL
  Filled 2021-09-01 (×3): qty 1

## 2021-09-01 MED ORDER — MORPHINE SULFATE (PF) 4 MG/ML IV SOLN: 4.0000 mg | INTRAVENOUS | Status: DC | PRN

## 2021-09-01 NOTE — Progress Notes (Signed)
PROGRESS NOTE    Dylan Good  YWV:[id number] DOB: [birth year] DOA: 08/28/2021 PCP: System, Provider Not In  Chief Complaint  Patient presents with   Nausea   Emesis    Brief Narrative:  51 yo with hx metastatic kidney cancer, hypertension, and multiple other medical problems who presented with recurrent pancreatitis.  Also incidentally covid positive.  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 site [facility])   Neutropenia ([place])   Brain metastases (HCC)   Pleural effusion on left   Hypertension   Lung metastases (HCC)   Pancreatitis   Assessment and Plan: * Acute pancreatitis- (present on admission) Lipase peaked to 200 CT with hazy contour of pancreatic head, concerning for pancreatitis He's had multiple recent admissions for pancreatitis At last discharge, he was instructed to hold his oral chemo with concern that chemo meds maybe contributing (cabometyx), but he's had recurrence despite that RUQ [place] without cholelithiasis Triglycerides 47 IgG4 wnl Denies etoh use I don't see obvious contributing meds Advance diet as tolerated, vomited yesterday, continue IVF Gi c/s given recurrent pancreatitis, appreciate recommendations  COVID-19 virus infection This likely explains fever at presentation to 63 F He's higher risk for complications with his metastatic cancer Stop paxlovid as he thinks this is contributing to nausea  Neutropenia ([place]) Improved today trend  Kidney cancer, primary, with metastasis from kidney to other site [facility])- (present on admission) He's not taking oral chemo right now (with concern that it was causing his recurrent pancreatitis) Follows outpatient with [name]  Brain metastases [facility])- (present on admission) noted  Lung metastases ([place])- (present on  admission) noted  Hypertension- (present on admission) Hold norvasc, resume in 48 hrs since stopping paxlovid  Pleural effusion on left- (present on admission) Noted    DVT prophylaxis: heparin Code Status: full Family Communication: none  Disposition:   Status is: Observation The patient will require care spanning > 2 midnights and should be moved to inpatient because: recurrent pancreatitis, covid  Consultants:  GI  Procedures:  none  Antimicrobials:  Anti-infectives (From admission, onward)    Start     Dose/Rate Route Frequency Ordered Stop   08/30/21 1300  nirmatrelvir/ritonavir EUA (PAXLOVID) 3 tablet  Status:  Discontinued        3 tablet Oral 2 times daily 08/30/21 1211 09/01/21 1200       Subjective: C/o nausea Thinks its the paxlovid, wwants to stop  Objective: Vitals:   08/31/21 1235 08/31/21 2203 09/01/21 0530 09/01/21 1224  BP: 108/79 118/87 104/72 123/69  Pulse: 80 80 81 77  Resp: 18 16 16 16   Temp: 97.8 F (36.6 C) 98.8 F (37.1 C) 99.3 F (37.4 C) 99 F (37.2 C)  TempSrc: Oral Oral Oral Oral  SpO2: 99% 100% 100% 93%  Weight:   70.6 kg   Height:        Intake/Output Summary (Last 24 hours) at 09/01/2021 1251 Last data filed at 09/01/2021 0600 Gross per 24 hour  Intake 2258.12 ml  Output 1800 ml  Net 458.12 ml   Filed Weights   08/30/21 0500 08/31/21 0515 09/01/21 0530  Weight: 73.1 kg 70.6 kg 70.6 kg    Examination:  General: No acute distress. Cardiovascular: RRR Lungs: unlabored Abdomen: Soft, nontender, nondistended Neurological: Alert and oriented 3. Moves all extremities 4 . Cranial nerves II  through XII grossly intact. Skin: Warm and dry. No rashes or lesions. Extremities: No clubbing or cyanosis. No edema.  Data Reviewed: I have personally reviewed following labs and imaging studies  CBC: Recent Labs  Lab 08/28/21 1930 08/29/21 0442 08/30/21 0433 08/31/21 0439 09/01/21 0412  WBC 3.4* 2.6* 2.4* 1.7* 2.2*   NEUTROABS 2.2 1.2* 1.1* 0.6* 1.0*  HGB 12.7* 11.7* 11.5* 11.4* 11.3*  HCT 40.0 37.7* 36.6* 36.4* 36.5*  MCV 77.8* 79.4* 79.2* 79.0* 82.0  PLT 251 212 209 197 425    Basic Metabolic Panel: Recent Labs  Lab 08/28/21 1930 08/29/21 0442 08/30/21 0433 08/31/21 0439 09/01/21 0412  NA 133* 133* 130* 135 131*  K 3.5 3.6 3.6 4.0 3.8  CL 103 105 103 104 101  CO2 22 22 19* 25 21*  GLUCOSE 97 83 70 86 78  BUN 8 6 6  <5* <5*  CREATININE 0.86 0.72 0.68 0.66 0.51*  CALCIUM 8.7* 8.0* 7.8* 8.7* 8.8*  MG  --   --  1.6* 2.1 1.7  PHOS  --   --  2.2* 2.8 3.0    GFR: Estimated Creatinine Clearance: 112.8 mL/min (Kalon Erhardt) (by C-G formula based on SCr of 0.51 mg/dL (L)).  Liver Function Tests: Recent Labs  Lab 08/28/21 1930 08/29/21 0442 08/30/21 0433 08/31/21 0439 09/01/21 0412  AST 16 13* 16 15 14*  ALT 12 11 11 11 9   ALKPHOS 71 62 51 53 48  BILITOT 1.0 0.8 0.9 0.8 0.6  PROT 7.8 6.5 6.6 6.9 6.6  ALBUMIN 3.8 3.2* 3.1* 3.4* 3.2*    CBG: No results for input(s): GLUCAP in the last 168 hours.   Recent Results (from the past 240 hour(s))  Blood culture (routine x 2)     Status: None (Preliminary result)   Collection Time: 08/28/21  7:30 PM   Specimen: BLOOD  Result Value Ref Range Status   Specimen Description   Final    BLOOD RIGHT ANTECUBITAL Performed at [place] [address] [place], [place] [address]    Special Requests   Final    BOTTLES DRAWN AEROBIC AND ANAEROBIC Blood Culture adequate volume Performed at [place] [address] [place], [place] [address]    Culture   Final    NO GROWTH 4 DAYS Performed at [facility], [place] [address] [place], [place] [address]    Report Status PENDING  Incomplete  Resp Panel by RT-PCR (Flu Aliene Tamura&B, Covid)     Status: Abnormal   Collection Time: 08/28/21  7:33 PM   Specimen: Nasopharyngeal(NP) swabs in vial transport medium  Result Value Ref Range Status   SARS Coronavirus 2 by RT  PCR POSITIVE (Deissy Guilbert) NEGATIVE Final    Comment: (NOTE) SARS-CoV-2 target nucleic acids are DETECTED.  The SARS-CoV-2 RNA is generally detectable in upper respiratory specimens during the acute phase of infection. Positive results are indicative of the presence of the identified virus, but do not rule out bacterial infection or co-infection with other pathogens not detected by the test. Clinical correlation with patient history and other diagnostic information is necessary to determine patient infection status. The expected result is Negative.  Fact Sheet for Patients: EntrepreneurPulse.com.au  Fact Sheet for Healthcare Providers: IncredibleEmployment.be  This test is not yet approved or cleared by the [place] FDA and  has been authorized for detection and/or diagnosis of SARS-CoV-2 by FDA under an Emergency Use Authorization (EUA).  This EUA will remain in effect (meaning this test  can be used) for the duration of  the COVID-19 declaration under Section 564(b)(1) of the [place]nis [place] ct, 21 U.S.C. section 360bbb-3(b)(1), unless the authorization is terminated or revoked sooner.     Influenza Adriel Kessen by PCR NEGATIVE NEGATIVE Final   Influenza B by PCR NEGATIVE NEGATIVE Final    Comment: (NOTE) The Xpert Xpress SARS-CoV-2/FLU/RSV plus assay is intended as an aid in the diagnosis of influenza from Nasopharyngeal swab specimens and should not be used as Nadalyn Deringer sole basis for treatment. Nasal washings and aspirates are unacceptable for Xpert Xpress SARS-CoV-2/FLU/RSV testing.  Fact Sheet for Patients: EntrepreneurPulse.com.au  Fact Sheet for Healthcare Providers: IncredibleEmployment.be  This test is not yet approved or cleared by the [place] FDA and has been authorized for detection and/or diagnosis of SARS-CoV-2 by FDA under an Emergency Use Authorization (EUA). This EUA will remain in effect (meaning this test can be  used) for the duration of the COVID-19 declaration under Section 564(b)(1) of the Act, 21 U.S.C. section 360bbb-3(b)(1), unless the authorization is terminated or revoked.  Performed at [facility], [place] [address] [place], [place] [address]   Blood culture (routine x 2)     Status: None (Preliminary result)   Collection Time: 08/28/21  7:45 PM   Specimen: BLOOD  Result Value Ref Range Status   Specimen Description   Final    BLOOD LEFT ANTECUBITAL Performed at [place] [address], [place], [place] [address]    Special Requests   Final    BOTTLES DRAWN AEROBIC AND ANAEROBIC Blood Culture adequate volume Performed at [place] [address], [place], [place] [address]    Culture   Final    NO GROWTH 4 DAYS Performed at [facility], [place] [address] [place], [place] [address]    Report Status PENDING  Incomplete         Radiology Studies: No results found.      Scheduled Meds:  heparin  5,000 Units Subcutaneous Q8H   pantoprazole  40 mg Oral Daily   Continuous Infusions:  lactated ringers 125 mL/hr at 09/01/21 0944     LOS: 3 days    Time spent: over 30 min    Fayrene Helper, MD [facility]   To contact the attending provider between 7A-7P or the covering provider during after hours 7P-7A, please log into the web site [email or web address] and access using universal [place] password for that web site. If you do not have the password, please call the hospital operator.  09/01/2021, 12:51 PM

## 2021-09-02 DIAGNOSIS — K859 Acute pancreatitis without necrosis or infection, unspecified: Secondary | ICD-10-CM | POA: Diagnosis not present

## 2021-09-02 LAB — COMPREHENSIVE METABOLIC PANEL
ALT: 9 U/L (ref 0–44)
AST: 12 U/L — ABNORMAL LOW (ref 15–41)
Albumin: 3.1 g/dL — ABNORMAL LOW (ref 3.5–5.0)
Alkaline Phosphatase: 48 U/L (ref 38–126)
Anion gap: 8 (ref 5–15)
BUN: <5 mg/dL — ABNORMAL LOW (ref 6–20)
CO2: 22 mmol/L (ref 22–32)
Calcium: 9.2 mg/dL (ref 8.9–10.3)
Chloride: 103 mmol/L (ref 98–111)
Creatinine, Ser: 0.62 mg/dL (ref 0.61–1.24)
GFR, Estimated: >60 mL/min (ref >60)
Glucose, Bld: 79 mg/dL (ref 70–99)
Potassium: 3.7 mmol/L (ref 3.5–5.1)
Sodium: 133 mmol/L — ABNORMAL LOW (ref 135–145)
Total Bilirubin: 0.9 mg/dL (ref 0.3–1.2)
Total Protein: 6.5 g/dL (ref 6.5–8.1)

## 2021-09-02 LAB — CULTURE, BLOOD (ROUTINE X 2)
Culture: NO GROWTH
Culture: NO GROWTH
Special Requests: ADEQUATE
Special Requests: ADEQUATE

## 2021-09-02 LAB — CBC WITH DIFFERENTIAL/PLATELET
Abs Immature Granulocytes: 0 10*3/uL (ref 0.00–0.07)
Basophils Absolute: 0 10*3/uL (ref 0.0–0.1)
Basophils Relative: 1 %
Eosinophils Absolute: 0 10*3/uL (ref 0.0–0.5)
Eosinophils Relative: 2 %
HCT: 34.3 % — ABNORMAL LOW (ref 39.0–52.0)
Hemoglobin: 11 g/dL — ABNORMAL LOW (ref 13.0–17.0)
Lymphocytes Relative: 49 %
Lymphs Abs: 1.1 10*3/uL (ref 0.7–4.0)
MCH: 25.5 pg — ABNORMAL LOW (ref 26.0–34.0)
MCHC: 32.1 g/dL (ref 30.0–36.0)
MCV: 79.6 fL — ABNORMAL LOW (ref 80.0–100.0)
Monocytes Absolute: 0.1 10*3/uL (ref 0.1–1.0)
Monocytes Relative: 6 %
Neutro Abs: 1 10*3/uL — ABNORMAL LOW (ref 1.7–7.7)
Neutrophils Relative %: 42 %
Platelets: 199 10*3/uL (ref 150–400)
RBC: 4.31 MIL/uL (ref 4.22–5.81)
RDW: 15.8 % — ABNORMAL HIGH (ref 11.5–15.5)
WBC: 2.3 10*3/uL — ABNORMAL LOW (ref 4.0–10.5)
nRBC: 0 % (ref 0.0–0.2)
nRBC: 2 /100 WBC — ABNORMAL HIGH

## 2021-09-02 LAB — PHOSPHORUS: Phosphorus: 2.8 mg/dL (ref 2.5–4.6)

## 2021-09-02 LAB — MAGNESIUM: Magnesium: 1.6 mg/dL — ABNORMAL LOW (ref 1.7–2.4)

## 2021-09-02 MED ORDER — MAGNESIUM OXIDE -MG SUPPLEMENT 400 (240 MG) MG PO TABS
400.0000 mg | ORAL_TABLET | Freq: Every day | ORAL | Status: DC
Start: 2021-09-02 — End: 2021-09-02
  Administered 2021-09-02: 400 mg via ORAL
  Filled 2021-09-02: qty 1

## 2021-09-02 NOTE — Progress Notes (Signed)
Assessment unchanged. Pt verbalized understanding of dc instructions through teach back. Discharged via wc to front entrance accompanied by NT x 2. COVID precautions observed.

## 2021-09-02 NOTE — Discharge Summary (Signed)
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  Also incidentally covid positive.  He's improved with conservative management.  Plan for outpatient follow up.  See below for additional details  Hospital Course:  Assessment and Plan: * Acute pancreatitis- (present on admission) Lipase peaked to 200 CT with hazy contour of pancreatic head, concerning for pancreatitis He's had multiple recent admissions for pancreatitis At last discharge, he was instructed to hold his oral chemo with concern that chemo meds maybe contributing (cabometyx), but he's had recurrence despite that RUQ [place] without cholelithiasis Triglycerides 47 IgG4 wnl Denies etoh use I don't see obvious contributing meds Tolerating diet, will discharge with plan for outpatient follow up.  Follow up with GI given recurrent gastroenterology.  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  Brain metastases [facility])- (present on admission) noted  Lung metastases ([place])- (present on admission) noted  Hypertension- (present on admission) Resume norvasc at discharge  Pleural effusion on left- (present on admission) Noted    Procedures: none   Consultations: GI  Discharge Exam: Vitals:   09/01/21 1928 09/02/21 0605  BP: 123/75 117/72  Pulse: 80 80  Resp: 16 18  Temp: 98.7 F (37.1 C) 98.7 F (37.1 C)  SpO2: 99% 99%   Eager to discharge before noon  General: No acute distress. Cardiovascular: RRR Lungs: unlabored Abdomen: Soft, nontender, nondistended  Neurological: Alert and oriented 3. Moves all extremities 4. Cranial nerves II through XII grossly intact. Skin: Warm and dry. No rashes or lesions. Extremities: No clubbing or cyanosis. No edema.  Discharge Instructions   Discharge Instructions     Call MD for:  difficulty breathing, headache or visual disturbances   Complete by: As directed    Call MD for:  extreme fatigue   Complete by: As directed    Call MD for:  hives   Complete by: As directed    Call MD for:  persistant dizziness or light-headedness   Complete by: As directed    Call MD for:  persistant nausea and vomiting   Complete by: As  directed    Call MD for:  redness, tenderness, or signs of infection (pain, swelling, redness, odor or green/yellow discharge around incision site)   Complete by: As directed    Call  MD for:  severe uncontrolled pain   Complete by: As directed    Call MD for:  temperature >100.4   Complete by: As directed    Diet - low sodium heart healthy   Complete by: As directed    Discharge instructions   Complete by: As directed    You were seen for recurrent pancreatitis.  You've improved with IV fluids, pain meds, and bowel rest.  It's not quite clear to me what is causing these recurrent episodes.  Please follow up with gastroenterology for additional workup and evaluation.  You had Tyrese Capriotti covid infection.  We started you on paxlovid, but stopped this because of adverse effects.  Continue to isolate until March 4th.    Return for new, recurrent, or worsening symptoms.   Please ask your PCP to request records from this hospitalization so they know what was done and what the next steps will be.   Increase activity slowly   Complete by: As directed       Allergies as of 09/02/2021   No Known Allergies      Medication List     TAKE these medications    amLODipine 5 MG tablet Commonly known as: NORVASC TAKE 1 TABLET (5 MG TOTAL) BY MOUTH DAILY.   diclofenac Sodium 1 % Gel Commonly known as: VOLTAREN Apply 2 g topically 4 (four) times daily.   docusate sodium 100 MG capsule Commonly known as: COLACE Take 1 capsule (100 mg total) by mouth 2 (two) times daily.   nicotine 14 mg/24hr patch Commonly known as: NICODERM CQ - dosed in mg/24 hours Place 1 patch (14 mg total) onto the skin daily.   ondansetron 4 MG tablet Commonly known as: ZOFRAN Take 1 tablet (4 mg total) by mouth every 6 (six) hours as needed for nausea.   oxyCODONE-acetaminophen 5-325 MG tablet Commonly known as: PERCOCET/ROXICET Take 1 tablet by mouth every 4 (four) hours as needed for moderate pain or severe pain.   pantoprazole 40 MG tablet Commonly known as: Protonix Take 1 tablet (40 mg total) by mouth daily.   polyethylene glycol 17 g packet Commonly known as: MIRALAX / GLYCOLAX Take 17 g  by mouth 2 (two) times daily.       No Known Allergies    The results of significant diagnostics from this hospitalization (including imaging, microbiology, ancillary and laboratory) are listed below for reference.    Significant Diagnostic Studies: DG Chest 2 View  Result Date: 08/28/2021 CLINICAL DATA:  Cough and fever.  Metastatic renal carcinoma. EXAM: CHEST - 2 VIEW COMPARISON:  04/04/2021 FINDINGS: Multiple bilateral pulmonary nodules are best seen on prior CT. Lung nodules the right appear mildly improved from the prior study. Decreased nodularity and infiltrate in the left lower lobe. Large extrapleural mass and rib destruction involving the left fifth rib laterally. This is similar to the prior study. Small left pleural effusion with mild interval improvement. Right paratracheal adenopathy similar to the prior study. IMPRESSION: Negative for acute pneumonia Metastatic disease in the chest. Several lung nodules appear smaller. Improved aeration left lower lobe.  Decreased left effusion. Electronically Signed   By: Franchot Gallo M.D.   On: 08/28/2021 19:08   CT Angio Chest PE W and/or Wo Contrast  Result Date: 08/28/2021 CLINICAL DATA:  Pulmonary embolism (PE) suspected, high prob; Sepsis. Patient with left-sided chest, rib, flank pain. Patient has history of metastatic cancer. He did not know he had Konnar Ben fever until he arrived today. Has cough which is chronic. Has had multiple episodes of emesis. Recently discharged for pancreatitis. Metastatic urologic cancer. EXAM: CT ANGIOGRAPHY CHEST CT ABDOMEN AND PELVIS WITH CONTRAST TECHNIQUE: Multidetector CT imaging of the chest was performed using the standard protocol during bolus administration of intravenous contrast. Multiplanar CT image reconstructions and MIPs were obtained to evaluate the vascular anatomy. Multidetector CT imaging of the abdomen and pelvis was performed using the standard protocol during bolus administration of intravenous  contrast. RADIATION DOSE REDUCTION: This exam was performed according to the departmental dose-optimization program which includes automated exposure control, adjustment of the mA and/or kV according to patient size and/or use of iterative reconstruction technique. CONTRAST:  118mL OMNIPAQUE IOHEXOL 350 MG/ML SOLN COMPARISON:  CT abdomen pelvis 07/01/2021, CT chest abdomen pelvis 04/16/2021. FINDINGS: CTA CHEST FINDINGS Cardiovascular: Satisfactory opacification of the pulmonary arteries to the segmental level. No evidence of pulmonary embolism. Normal heart size. No significant pericardial effusion. The thoracic aorta is normal in caliber. No atherosclerotic plaque of the thoracic aorta. No coronary artery calcifications. Mediastinum/Nodes: Interval decrease in size of conglomerative mediastinal lymphadenopathy with as an example Leib Elahi subcarinal 2 x 5.5 cm lesion (from 5.9 x 3.1 cm). Interval decrease in hilar lymphadenopathy with as an example Jahne Krukowski 1.8 cm (from 2.6 cm) right hilar lymph node (6:38). No axillary lymph nodes. Thyroid gland, trachea, and esophagus demonstrate no significant findings. Lungs/Pleura: Interval decrease in size of innumerable pulmonary metastases with as an example Natisha Trzcinski 3.3 x 2.6 cm (from 3.7 x 3.6 cm) left lower lobe (12:109) status as well as Cormick Moss left upper lobe 1.8 x 2.1 cm (12:71) right upper lobe (from 2.2 x 2.1 cm). No pulmonary mass. Stable left pleural effusion. Redemonstration of interval decrease in size of associated left pleural nodularity/mass (6:81) now measuring 8.2 x 1.3 cm (from 8.7 x 2.3 cm). No pneumothorax. Musculoskeletal: No chest wall abnormality. No suspicious lytic or blastic osseous lesions. No acute displaced fracture. Interval decrease in size of Glennda Weatherholtz left lateral fifth rib soft tissue metastasis now measuring 6.3 x 4.1 cm (from 6.8 x 4.9 cm) with associated persistent resorption/destruction of the lateral fifth rib. Review of the MIP images confirms the above findings. CT  ABDOMEN and PELVIS FINDINGS Hepatobiliary: No focal liver abnormality. No gallstones, gallbladder wall thickening, or pericholecystic fluid. No biliary dilatation. Pancreas: No focal lesion. Query slightly hazy contour of the pancreatic head. Otherwise no surrounding inflammatory changes. No main pancreatic ductal dilatation. No pancreatic pseudocyst formation. Spleen: Normal in size without focal abnormality. Adrenals/Urinary Tract: Grossly stable 6 x 2.4 cm (from 6 x 2.6 cm) left adrenal gland metastatic lesion (2:27). The right adrenal gland is normal with no nodularity. Bilateral kidneys enhance symmetrically. Redemonstration of Westley Blass grossly stable in size heterogeneous 12 x 10 cm right renal lesion. Redemonstration of several other subcentimeter hypodensities within bilateral kidneys. Interval decrease in size of Edilia Ghuman heterogeneous 2.1 x 2.1 (from 2.2 x 2.3 cm) left renal lesion (2:37). No hydronephrosis. No hydroureter. The urinary bladder is unremarkable. Stomach/Bowel: Stomach is within normal limits. No evidence of bowel wall thickening or dilatation. The appendix not definitely identified. Vascular/Lymphatic: The inferior vena cava is patent. The hepatic, portal, splenic, superior mesenteric veins are patent. The right renal vein appears to be patent. The left renal vein appears to be patent. No abdominal aorta  or iliac aneurysm. Mild atherosclerotic plaque of the aorta and its branches. No abdominal, pelvic, or inguinal lymphadenopathy. Reproductive: Prominent prostate. Other: No intraperitoneal free fluid. No intraperitoneal free gas. No organized fluid collection. Musculoskeletal: No abdominal wall hernia or abnormality. No suspicious lytic or blastic osseous lesions. No acute displaced fracture. Multilevel degenerative changes of the spine. Bilateral sacroiliac joint degenerative changes. Review of the MIP images confirms the above findings. IMPRESSION: 1. No pulmonary embolus. 2. Query slightly hazy contour  of the pancreatic head. Correlate with lipase levels for acute pancreatitis. 3. Grossly stable 12 cm right renal malignancy. 4. Interval decrease in size of multiple known metastases including bilateral pulmonary metastases, left pleural metastases, hilar and mediastinal lymphadenopathy, as well as Karlina Suares left chest wall metastasis involving the left fifth rib. 5. Grossly stable left adrenal gland metastasis. 6. Grossly stable to possibly slightly decreased in size 2.1 cm left renal malignancy. Electronically Signed   By: Iven Finn M.D.   On: 08/28/2021 20:53   MR Brain W Wo Contrast  Result Date: 08/17/2021 EXAM: MRI HEAD WITHOUT AND WITH CONTRAST TECHNIQUE: Multiplanar, multiecho pulse sequences of the brain and surrounding structures were obtained without and with intravenous contrast. CONTRAST:  30mL MULTIHANCE GADOBENATE DIMEGLUMINE 529 MG/ML IV SOLN COMPARISON:  MRI June 13, 2021. FINDINGS: Brain: Similar minimal linear enhancement at the site of previously treated left temporal metastasis with slightly increased surrounding T2/FLAIR hyperintensity and increased conspicuity of susceptibility artifact. No progressive or masslike enhancement. Resolution of previously seen T1 hyperintensity in the cerebellum with correlate susceptibility artifact/T2 hypointensity in this region and adjacent developmental venous anomaly. No abnormal enhancement in this region. No new enhancing lesions identified. No evidence of acute infarct, acute hemorrhage, midline shift, extra-axial fluid collection or hydrocephalus. Vascular: Major arterial flow voids are maintained skull base. Skull and upper cervical spine: Normal marrow signal. Sinuses/Orbits: Clear sinuses.  Unremarkable orbits. Other: No sizable mastoid effusions. IMPRESSION: 1. Similar minimal linear enhancement at the site of previously treated left temporal metastasis with slightly increased surrounding T2/FLAIR hyperintensity and increased conspicuity of  susceptibility artifact. Findings probably reflect treatment related change, but warrant attention on close interval follow-up to exclude progressive disease. 2. Resolution of previously seen T1 hyperintensity in the cerebellum with correlate susceptibility artifact/T2 hypointensity in this region and adjacent developmental venous anomaly. Findings could represent Abou Sterkel treated hemorrhagic metastasis or prior hemorrhage of Olan Kurek cavernous malformation. Recommend continued attention on follow-up. 3. No new enhancing lesions or acute abnormality. Electronically Signed   By: Margaretha Sheffield M.D.   On: 08/17/2021 09:15   CT ABDOMEN PELVIS W CONTRAST  Result Date: 08/28/2021 CLINICAL DATA:  Pulmonary embolism (PE) suspected, high prob; Sepsis. Patient with left-sided chest, rib, flank pain. Patient has history of metastatic cancer. He did not know he had Tarell Schollmeyer fever until he arrived today. Has cough which is chronic. Has had multiple episodes of emesis. Recently discharged for pancreatitis. Metastatic urologic cancer. EXAM: CT ANGIOGRAPHY CHEST CT ABDOMEN AND PELVIS WITH CONTRAST TECHNIQUE: Multidetector CT imaging of the chest was performed using the standard protocol during bolus administration of intravenous contrast. Multiplanar CT image reconstructions and MIPs were obtained to evaluate the vascular anatomy. Multidetector CT imaging of the abdomen and pelvis was performed using the standard protocol during bolus administration of intravenous contrast. RADIATION DOSE REDUCTION: This exam was performed according to the departmental dose-optimization program which includes automated exposure control, adjustment of the mA and/or kV according to patient size and/or use of iterative reconstruction technique. CONTRAST:  139mL OMNIPAQUE IOHEXOL 350 MG/ML SOLN COMPARISON:  CT abdomen pelvis 07/01/2021, CT chest abdomen pelvis 04/16/2021. FINDINGS: CTA CHEST FINDINGS Cardiovascular: Satisfactory opacification of the pulmonary  arteries to the segmental level. No evidence of pulmonary embolism. Normal heart size. No significant pericardial effusion. The thoracic aorta is normal in caliber. No atherosclerotic plaque of the thoracic aorta. No coronary artery calcifications. Mediastinum/Nodes: Interval decrease in size of conglomerative mediastinal lymphadenopathy with as an example Tashai Catino subcarinal 2 x 5.5 cm lesion (from 5.9 x 3.1 cm). Interval decrease in hilar lymphadenopathy with as an example Caetano Oberhaus 1.8 cm (from 2.6 cm) right hilar lymph node (6:38). No axillary lymph nodes. Thyroid gland, trachea, and esophagus demonstrate no significant findings. Lungs/Pleura: Interval decrease in size of innumerable pulmonary metastases with as an example Giovanni Biby 3.3 x 2.6 cm (from 3.7 x 3.6 cm) left lower lobe (12:109) status as well as Briyah Wheelwright left upper lobe 1.8 x 2.1 cm (12:71) right upper lobe (from 2.2 x 2.1 cm). No pulmonary mass. Stable left pleural effusion. Redemonstration of interval decrease in size of associated left pleural nodularity/mass (6:81) now measuring 8.2 x 1.3 cm (from 8.7 x 2.3 cm). No pneumothorax. Musculoskeletal: No chest wall abnormality. No suspicious lytic or blastic osseous lesions. No acute displaced fracture. Interval decrease in size of Rosene Pilling left lateral fifth rib soft tissue metastasis now measuring 6.3 x 4.1 cm (from 6.8 x 4.9 cm) with associated persistent resorption/destruction of the lateral fifth rib. Review of the MIP images confirms the above findings. CT ABDOMEN and PELVIS FINDINGS Hepatobiliary: No focal liver abnormality. No gallstones, gallbladder wall thickening, or pericholecystic fluid. No biliary dilatation. Pancreas: No focal lesion. Query slightly hazy contour of the pancreatic head. Otherwise no surrounding inflammatory changes. No main pancreatic ductal dilatation. No pancreatic pseudocyst formation. Spleen: Normal in size without focal abnormality. Adrenals/Urinary Tract: Grossly stable 6 x 2.4 cm (from 6 x 2.6 cm)  left adrenal gland metastatic lesion (2:27). The right adrenal gland is normal with no nodularity. Bilateral kidneys enhance symmetrically. Redemonstration of Fred Franzen grossly stable in size heterogeneous 12 x 10 cm right renal lesion. Redemonstration of several other subcentimeter hypodensities within bilateral kidneys. Interval decrease in size of Keilynn Marano heterogeneous 2.1 x 2.1 (from 2.2 x 2.3 cm) left renal lesion (2:37). No hydronephrosis. No hydroureter. The urinary bladder is unremarkable. Stomach/Bowel: Stomach is within normal limits. No evidence of bowel wall thickening or dilatation. The appendix not definitely identified. Vascular/Lymphatic: The inferior vena cava is patent. The hepatic, portal, splenic, superior mesenteric veins are patent. The right renal vein appears to be patent. The left renal vein appears to be patent. No abdominal aorta or iliac aneurysm. Mild atherosclerotic plaque of the aorta and its branches. No abdominal, pelvic, or inguinal lymphadenopathy. Reproductive: Prominent prostate. Other: No intraperitoneal free fluid. No intraperitoneal free gas. No organized fluid collection. Musculoskeletal: No abdominal wall hernia or abnormality. No suspicious lytic or blastic osseous lesions. No acute displaced fracture. Multilevel degenerative changes of the spine. Bilateral sacroiliac joint degenerative changes. Review of the MIP images confirms the above findings. IMPRESSION: 1. No pulmonary embolus. 2. Query slightly hazy contour of the pancreatic head. Correlate with lipase levels for acute pancreatitis. 3. Grossly stable 12 cm right renal malignancy. 4. Interval decrease in size of multiple known metastases including bilateral pulmonary metastases, left pleural metastases, hilar and mediastinal lymphadenopathy, as well as Demarian Epps left chest wall metastasis involving the left fifth rib. 5. Grossly stable left adrenal gland metastasis. 6. Grossly stable to possibly slightly decreased  in size 2.1 cm left  renal malignancy. Electronically Signed   By: Iven Finn M.D.   On: 08/28/2021 20:53   US Abdomen Limited RUQ (LIVER/GB)  Result Date: 08/29/2021 CLINICAL DATA:  Pancreatitis EXAM: ULTRASOUND ABDOMEN LIMITED RIGHT UPPER QUADRANT COMPARISON:  CT 08/28/2021 FINDINGS: Gallbladder: No gallstones or wall thickening visualized. No sonographic Murphy sign noted by sonographer. Common bile duct: Diameter: Obscured by bowel gas. No gross intrahepatic duct dilatation identified. No choledocholithiasis seen on recent CT. Liver: No focal lesion identified. Within normal limits in parenchymal echogenicity. Portal vein is patent on color Doppler imaging with normal direction of blood flow towards the liver. Other: None. IMPRESSION: No cholelithiasis. Obscured common duct, without biliary duct dilatation or choledocholithiasis on recent CT. Electronically Signed   By: Abigail Miyamoto M.D.   On: 08/29/2021 11:59    Microbiology: Recent Results (from the past 240 hour(s))  Blood culture (routine x 2)     Status: None   Collection Time: 08/28/21  7:30 PM   Specimen: BLOOD  Result Value Ref Range Status   Specimen Description   Final    BLOOD RIGHT ANTECUBITAL Performed at [facility] [address], [place], [place] [address]    Special Requests   Final    BOTTLES DRAWN AEROBIC AND ANAEROBIC Blood Culture adequate volume Performed at [facility] [address] [place], [place] [address]    Culture   Final    NO GROWTH 5 DAYS Performed at [facility], [place] [address] [place], [place] [address]    Report Status 09/02/2021 FINAL  Final  Resp Panel by RT-PCR (Flu Tirso Laws&B, Covid)     Status: Abnormal   Collection Time: 08/28/21  7:33 PM   Specimen: Nasopharyngeal(NP) swabs in vial transport medium  Result Value Ref Range Status   SARS Coronavirus 2 by RT PCR POSITIVE (Hend Mccarrell) NEGATIVE Final    Comment: (NOTE) SARS-CoV-2 target nucleic acids are DETECTED.  The  SARS-CoV-2 RNA is generally detectable in upper respiratory specimens during the acute phase of infection. Positive results are indicative of the presence of the identified virus, but do not rule out bacterial infection or co-infection with other pathogens not detected by the test. Clinical correlation with patient history and other diagnostic information is necessary to determine patient infection status. The expected result is Negative.  Fact Sheet for Patients: EntrepreneurPulse.com.au  Fact Sheet for Healthcare Providers: IncredibleEmployment.be  This test is not yet approved or cleared by the [place] FDA and  has been authorized for detection and/or diagnosis of SARS-CoV-2 by FDA under an Emergency Use Authorization (EUA).  This EUA will remain in effect (meaning this test can be used) for the duration of  the COVID-19 declaration under Section 564(b)(1) of the [address], 21 U.S.C. section 360bbb-3(b)(1), unless the authorization is terminated or revoked sooner.     Influenza Raziel Koenigs by PCR NEGATIVE NEGATIVE Final   Influenza B by PCR NEGATIVE NEGATIVE Final    Comment: (NOTE) The Xpert Xpress SARS-CoV-2/FLU/RSV plus assay is intended as an aid in the diagnosis of influenza from Nasopharyngeal swab specimens and should not be used as Bera Pinela sole basis for treatment. Nasal washings and aspirates are unacceptable for Xpert Xpress SARS-CoV-2/FLU/RSV testing.  Fact Sheet for Patients: EntrepreneurPulse.com.au  Fact Sheet for Healthcare Providers: IncredibleEmployment.be  This test is not yet approved or cleared by the [place] FDA and has been authorized for detection and/or diagnosis of SARS-CoV-2 by FDA under an Emergency  Use Authorization (EUA). This EUA will remain in effect (meaning this test can be used) for the duration of the COVID-19 declaration under Section 564(b)(1) of the Act, 21 U.S.C. section  360bbb-3(b)(1), unless the authorization is terminated or revoked.  Performed at [facility], [place] [address] [place], [place] [address]   Blood culture (routine x 2)     Status: None   Collection Time: 08/28/21  7:45 PM   Specimen: BLOOD  Result Value Ref Range Status   Specimen Description   Final    BLOOD LEFT ANTECUBITAL Performed at [facility] [address], [place], [place] [address]    Special Requests   Final    BOTTLES DRAWN AEROBIC AND ANAEROBIC Blood Culture adequate volume Performed at [facility] [address], [place], [place] [address]    Culture   Final    NO GROWTH 5 DAYS Performed at [facility], [place] [address] [place], [place] [address]    Report Status 09/02/2021 FINAL  Final     Labs: Basic Metabolic Panel: Recent Labs  Lab 08/29/21 0442 08/30/21 0433 08/31/21 0439 09/01/21 0412 09/02/21 0442  NA 133* 130* 135 131* 133*  K 3.6 3.6 4.0 3.8 3.7  CL 105 103 104 101 103  CO2 22 19* 25 21* 22  GLUCOSE 83 70 86 78 79  BUN 6 6 <5* <5* <5*  CREATININE 0.72 0.68 0.66 0.51* 0.62  CALCIUM 8.0* 7.8* 8.7* 8.8* 9.2  MG  --  1.6* 2.1 1.7 1.6*  PHOS  --  2.2* 2.8 3.0 2.8   Liver Function Tests: Recent Labs  Lab 08/29/21 0442 08/30/21 0433 08/31/21 0439 09/01/21 0412 09/02/21 0442  AST 13* 16 15 14* 12*  ALT 11 11 11 9 9   ALKPHOS 62 51 53 48 48  BILITOT 0.8 0.9 0.8 0.6 0.9  PROT 6.5 6.6 6.9 6.6 6.5  ALBUMIN 3.2* 3.1* 3.4* 3.2* 3.1*   Recent Labs  Lab 08/28/21 1930 08/29/21 0442 09/01/21 0412  LIPASE 200* 119* 55*   No results for input(s): AMMONIA in the last 168 hours. CBC: Recent Labs  Lab 08/29/21 0442 08/30/21 0433 08/31/21 0439 09/01/21 0412 09/02/21 0442  WBC 2.6* 2.4* 1.7* 2.2* 2.3*  NEUTROABS 1.2* 1.1* 0.6* 1.0* 1.0*  HGB 11.7* 11.5* 11.4* 11.3* 11.0*  HCT 37.7* 36.6* 36.4* 36.5* 34.3*  MCV 79.4* 79.2* 79.0* 82.0 79.6*  PLT 212 209 197 193 199    Cardiac Enzymes: No results for input(s): CKTOTAL, CKMB, CKMBINDEX, TROPONINI in the last 168 hours. BNP: BNP (last 3 results) No results for input(s): BNP in the last 8760 hours.  ProBNP (last 3 results) No results for input(s): PROBNP in the last 8760 hours.  CBG: No results for input(s): GLUCAP in the last 168 hours.     Signed:  Fayrene Helper MD.  [facility] 09/02/2021, 9:52 AM

## 2021-09-02 NOTE — Plan of Care (Signed)
°  Problem: Education: Goal: Knowledge of General Education information will improve Description: Including pain rating scale, medication(s)/side effects and non-pharmacologic comfort measures Outcome: Adequate for Discharge   Problem: Health Behavior/Discharge Planning: Goal: Ability to manage health-related needs will improve Outcome: Adequate for Discharge   Problem: Clinical Measurements: Goal: Ability to maintain clinical measurements within normal limits will improve Outcome: Adequate for Discharge Goal: Will remain free from infection Outcome: Adequate for Discharge Goal: Diagnostic test results will improve Outcome: Adequate for Discharge Goal: Respiratory complications will improve Outcome: Adequate for Discharge Goal: Cardiovascular complication will be avoided Outcome: Adequate for Discharge   Problem: Activity: Goal: Risk for activity intolerance will decrease Outcome: Adequate for Discharge   Problem: Nutrition: Goal: Adequate nutrition will be maintained Outcome: Adequate for Discharge   Problem: Pain Managment: Goal: General experience of comfort will improve Outcome: Adequate for Discharge

## 2021-09-03 ENCOUNTER — Other Ambulatory Visit (HOSPITAL_COMMUNITY): Payer: Self-pay

## 2021-09-11 ENCOUNTER — Other Ambulatory Visit: Payer: Medicaid Other

## 2021-09-11 ENCOUNTER — Ambulatory Visit: Payer: Medicaid Other | Admitting: Oncology

## 2021-09-12 ENCOUNTER — Inpatient Hospital Stay (HOSPITAL_COMMUNITY)
Admission: EM | Admit: 2021-09-12 | Discharge: 2021-09-16 | DRG: 641 | Disposition: A | Discharge status: Home / Self Care | Payer: Medicaid Other | Attending: Internal Medicine | Admitting: Internal Medicine

## 2021-09-12 ENCOUNTER — Emergency Department (HOSPITAL_COMMUNITY): Payer: Medicaid Other

## 2021-09-12 DIAGNOSIS — C7989 Secondary malignant neoplasm of other specified sites: Secondary | ICD-10-CM | POA: Diagnosis present

## 2021-09-12 DIAGNOSIS — Z8616 Personal history of COVID-19: Secondary | ICD-10-CM

## 2021-09-12 DIAGNOSIS — C7802 Secondary malignant neoplasm of left lung: Secondary | ICD-10-CM | POA: Diagnosis present

## 2021-09-12 DIAGNOSIS — R0602 Shortness of breath: Secondary | ICD-10-CM

## 2021-09-12 DIAGNOSIS — J90 Pleural effusion, not elsewhere classified: Secondary | ICD-10-CM | POA: Diagnosis present

## 2021-09-12 DIAGNOSIS — C7800 Secondary malignant neoplasm of unspecified lung: Secondary | ICD-10-CM | POA: Diagnosis present

## 2021-09-12 DIAGNOSIS — C7801 Secondary malignant neoplasm of right lung: Secondary | ICD-10-CM | POA: Diagnosis present

## 2021-09-12 DIAGNOSIS — C641 Malignant neoplasm of right kidney, except renal pelvis: Secondary | ICD-10-CM

## 2021-09-12 DIAGNOSIS — F17210 Nicotine dependence, cigarettes, uncomplicated: Secondary | ICD-10-CM | POA: Diagnosis present

## 2021-09-12 DIAGNOSIS — Z79899 Other long term (current) drug therapy: Secondary | ICD-10-CM

## 2021-09-12 DIAGNOSIS — C7931 Secondary malignant neoplasm of brain: Secondary | ICD-10-CM | POA: Diagnosis present

## 2021-09-12 DIAGNOSIS — Z9049 Acquired absence of other specified parts of digestive tract: Secondary | ICD-10-CM

## 2021-09-12 DIAGNOSIS — R000 Tachycardia, unspecified: Secondary | ICD-10-CM | POA: Diagnosis present

## 2021-09-12 DIAGNOSIS — U071 COVID-19: Secondary | ICD-10-CM | POA: Diagnosis present

## 2021-09-12 DIAGNOSIS — R062 Wheezing: Secondary | ICD-10-CM

## 2021-09-12 DIAGNOSIS — E860 Dehydration: Secondary | ICD-10-CM | POA: Diagnosis present

## 2021-09-12 DIAGNOSIS — I10 Essential (primary) hypertension: Secondary | ICD-10-CM | POA: Diagnosis present

## 2021-09-12 DIAGNOSIS — C7951 Secondary malignant neoplasm of bone: Secondary | ICD-10-CM | POA: Diagnosis present

## 2021-09-12 DIAGNOSIS — C649 Malignant neoplasm of unspecified kidney, except renal pelvis: Secondary | ICD-10-CM | POA: Diagnosis present

## 2021-09-12 LAB — TROPONIN I (HIGH SENSITIVITY)
Troponin I (High Sensitivity): <2 ng/L (ref <18)
Troponin I (High Sensitivity): <2 ng/L (ref <18)

## 2021-09-12 LAB — COMPREHENSIVE METABOLIC PANEL
ALT: 14 U/L (ref 0–44)
AST: 17 U/L (ref 15–41)
Albumin: 3.8 g/dL (ref 3.5–5.0)
Alkaline Phosphatase: 54 U/L (ref 38–126)
Anion gap: 5 (ref 5–15)
BUN: 13 mg/dL (ref 6–20)
CO2: 27 mmol/L (ref 22–32)
Calcium: 13.6 mg/dL (ref 8.9–10.3)
Chloride: 102 mmol/L (ref 98–111)
Creatinine, Ser: 0.67 mg/dL (ref 0.61–1.24)
GFR, Estimated: >60 mL/min (ref >60)
Glucose, Bld: 144 mg/dL — ABNORMAL HIGH (ref 70–99)
Potassium: 4 mmol/L (ref 3.5–5.1)
Sodium: 134 mmol/L — ABNORMAL LOW (ref 135–145)
Total Bilirubin: 0.8 mg/dL (ref 0.3–1.2)
Total Protein: 8.1 g/dL (ref 6.5–8.1)

## 2021-09-12 LAB — LACTIC ACID, PLASMA
Lactic Acid, Venous: 2.2 mmol/L (ref 0.5–1.9)
Lactic Acid, Venous: 2.5 mmol/L (ref 0.5–1.9)

## 2021-09-12 LAB — URINALYSIS, ROUTINE W REFLEX MICROSCOPIC
Bacteria, UA: NONE SEEN
Bilirubin Urine: NEGATIVE
Glucose, UA: NEGATIVE mg/dL
Ketones, ur: NEGATIVE mg/dL
Nitrite: NEGATIVE
Protein, ur: 30 mg/dL — AB
Specific Gravity, Urine: 1.014 (ref 1.005–1.030)
pH: 6 (ref 5.0–8.0)

## 2021-09-12 LAB — CBC WITH DIFFERENTIAL/PLATELET
Abs Immature Granulocytes: 0.01 10*3/uL (ref 0.00–0.07)
Basophils Absolute: 0 10*3/uL (ref 0.0–0.1)
Basophils Relative: 1 %
Eosinophils Absolute: 0.1 10*3/uL (ref 0.0–0.5)
Eosinophils Relative: 2 %
HCT: 37.4 % — ABNORMAL LOW (ref 39.0–52.0)
Hemoglobin: 11.7 g/dL — ABNORMAL LOW (ref 13.0–17.0)
Immature Granulocytes: 0 %
Lymphocytes Relative: 27 %
Lymphs Abs: 1.1 10*3/uL (ref 0.7–4.0)
MCH: 25.1 pg — ABNORMAL LOW (ref 26.0–34.0)
MCHC: 31.3 g/dL (ref 30.0–36.0)
MCV: 80.3 fL (ref 80.0–100.0)
Monocytes Absolute: 0.6 10*3/uL (ref 0.1–1.0)
Monocytes Relative: 15 %
Neutro Abs: 2.3 10*3/uL (ref 1.7–7.7)
Neutrophils Relative %: 55 %
Platelets: 313 10*3/uL (ref 150–400)
RBC: 4.66 MIL/uL (ref 4.22–5.81)
RDW: 14.7 % (ref 11.5–15.5)
WBC: 4.1 10*3/uL (ref 4.0–10.5)
nRBC: 0 % (ref 0.0–0.2)

## 2021-09-12 LAB — PROTIME-INR
INR: 1.2 (ref 0.8–1.2)
Prothrombin Time: 15.1 seconds (ref 11.4–15.2)

## 2021-09-12 LAB — CALCIUM: Calcium: 13.4 mg/dL (ref 8.9–10.3)

## 2021-09-12 LAB — APTT: aPTT: 30 seconds (ref 24–36)

## 2021-09-12 MED ORDER — POLYETHYLENE GLYCOL 3350 17 G PO PACK: 17.0000 g | PACK | Freq: Every day | ORAL | Status: DC | PRN

## 2021-09-12 MED ORDER — ONDANSETRON HCL 4 MG/2ML IJ SOLN
4.0000 mg | Freq: Four times a day (QID) | INTRAMUSCULAR | Status: DC | PRN
  Administered 2021-09-13 – 2021-09-15 (×4): 4 mg via INTRAVENOUS
  Filled 2021-09-12 (×4): qty 2

## 2021-09-12 MED ORDER — ARFORMOTEROL TARTRATE 15 MCG/2ML IN NEBU
15.0000 ug | INHALATION_SOLUTION | Freq: Two times a day (BID) | RESPIRATORY_TRACT | Status: DC
  Administered 2021-09-12 – 2021-09-15 (×6): 15 ug via RESPIRATORY_TRACT
  Filled 2021-09-12 (×8): qty 2

## 2021-09-12 MED ORDER — SODIUM CHLORIDE 0.9 % IV SOLN: INTRAVENOUS | Status: DC

## 2021-09-12 MED ORDER — BUDESONIDE 0.25 MG/2ML IN SUSP
0.2500 mg | Freq: Two times a day (BID) | RESPIRATORY_TRACT | Status: DC
  Administered 2021-09-12 – 2021-09-16 (×8): 0.25 mg via RESPIRATORY_TRACT
  Filled 2021-09-12 (×9): qty 2

## 2021-09-12 MED ORDER — IPRATROPIUM-ALBUTEROL 0.5-2.5 (3) MG/3ML IN SOLN
RESPIRATORY_TRACT | Status: AC
  Administered 2021-09-12: 3 mL via RESPIRATORY_TRACT
  Filled 2021-09-12: qty 3

## 2021-09-12 MED ORDER — ENOXAPARIN SODIUM 40 MG/0.4ML IJ SOSY
40.0000 mg | PREFILLED_SYRINGE | INTRAMUSCULAR | Status: DC
  Administered 2021-09-12 – 2021-09-15 (×4): 40 mg via SUBCUTANEOUS
  Filled 2021-09-12 (×4): qty 0.4

## 2021-09-12 MED ORDER — IPRATROPIUM-ALBUTEROL 0.5-2.5 (3) MG/3ML IN SOLN
3.0000 mL | Freq: Once | RESPIRATORY_TRACT | Status: AC
Start: 2021-09-12 — End: 2021-09-12
  Administered 2021-09-12: 3 mL via RESPIRATORY_TRACT
  Filled 2021-09-12: qty 3

## 2021-09-12 MED ORDER — DEXAMETHASONE SODIUM PHOSPHATE 10 MG/ML IJ SOLN
10.0000 mg | Freq: Once | INTRAMUSCULAR | Status: AC
  Administered 2021-09-12: 10 mg via INTRAVENOUS
  Filled 2021-09-12: qty 1

## 2021-09-12 MED ORDER — ONDANSETRON HCL 4 MG PO TABS: 4.0000 mg | ORAL_TABLET | Freq: Four times a day (QID) | ORAL | Status: DC | PRN

## 2021-09-12 MED ORDER — ACETAMINOPHEN 325 MG PO TABS
650.0000 mg | ORAL_TABLET | Freq: Four times a day (QID) | ORAL | Status: DC | PRN
  Administered 2021-09-13: 19:00:00 650 mg via ORAL
  Filled 2021-09-12: qty 2

## 2021-09-12 MED ORDER — LACTATED RINGERS IV BOLUS
1000.0000 mL | Freq: Once | INTRAVENOUS | Status: AC
  Administered 2021-09-12: 1000 mL via INTRAVENOUS

## 2021-09-12 MED ORDER — ZOLEDRONIC ACID 4 MG/5ML IV CONC
4.0000 mg | Freq: Once | INTRAVENOUS | Status: AC
  Administered 2021-09-12: 4 mg via INTRAVENOUS
  Filled 2021-09-12: qty 5

## 2021-09-12 MED ORDER — BENZONATATE 100 MG PO CAPS
100.0000 mg | ORAL_CAPSULE | Freq: Three times a day (TID) | ORAL | Status: DC
  Administered 2021-09-12 – 2021-09-15 (×9): 100 mg via ORAL
  Filled 2021-09-12 (×9): qty 1

## 2021-09-12 MED ORDER — DM-GUAIFENESIN ER 30-600 MG PO TB12
1.0000 | ORAL_TABLET | Freq: Two times a day (BID) | ORAL | Status: DC
  Administered 2021-09-12 – 2021-09-16 (×8): 1 via ORAL
  Filled 2021-09-12 (×8): qty 1

## 2021-09-12 MED ORDER — ACETAMINOPHEN 650 MG RE SUPP
650.0000 mg | Freq: Four times a day (QID) | RECTAL | Status: DC | PRN
Start: 2021-09-12 — End: 2021-09-16

## 2021-09-12 MED ORDER — CEFDINIR 300 MG PO CAPS
300.0000 mg | ORAL_CAPSULE | Freq: Two times a day (BID) | ORAL | Status: AC
  Administered 2021-09-12 – 2021-09-15 (×6): 300 mg via ORAL
  Filled 2021-09-12 (×7): qty 1

## 2021-09-12 MED ORDER — IPRATROPIUM-ALBUTEROL 0.5-2.5 (3) MG/3ML IN SOLN
3.0000 mL | Freq: Once | RESPIRATORY_TRACT | Status: AC
  Filled 2021-09-12: qty 3

## 2021-09-12 MED ORDER — PANTOPRAZOLE SODIUM 40 MG PO TBEC
40.0000 mg | DELAYED_RELEASE_TABLET | Freq: Every day | ORAL | Status: DC
  Administered 2021-09-13 – 2021-09-16 (×4): 40 mg via ORAL
  Filled 2021-09-12 (×4): qty 1

## 2021-09-12 NOTE — Assessment & Plan Note (Addendum)
With brain and lung metastasis. ?Patient was on Cabometyx.  This was held for 1 week end of January due to concern with presentation with pancreatitis. ?Unfortunately patient had not seen oncologist since then. ?Oncology recommends to resume cabometyx on discharge. Informed Oncology that the pt has ran out of this med and will need a new script.  ?

## 2021-09-12 NOTE — ED Provider Notes (Signed)
?  Physical Exam  ?BP 139/75   Pulse (!) 107   Temp 98.3 ?F (36.8 ?C) (Oral)   Resp 19   Ht 5\' 9"  (1.753 m)   Wt 68 kg   SpO2 97%   BMI 22.15 kg/m?  ? ?Physical Exam ?Vitals and nursing note reviewed.  ?Constitutional:   ?   General: He is not in acute distress. ?   Appearance: He is not ill-appearing or toxic-appearing.  ?HENT:  ?   Mouth/Throat:  ?   Mouth: Mucous membranes are moist.  ?Cardiovascular:  ?   Rate and Rhythm: Tachycardia present.  ?Pulmonary:  ?   Effort: No respiratory distress.  ?   Breath sounds: Wheezing and rhonchi present.  ?   Comments: Patient has significant inspiratory and expiratory wheezing with rhonchi.  No respiratory distress, accessory muscle use, tripoding, nasal flaring, or cyanosis present.  Patient is still able to speak in full sentences with ease and is satting well on room air. ?Abdominal:  ?   General: Bowel sounds are normal.  ?   Palpations: Abdomen is soft.  ?   Tenderness: There is no abdominal tenderness. There is no guarding or rebound.  ?Neurological:  ?   Mental Status: He is alert and oriented to person, place, and time.  ? ? ?Procedures  ?Procedures ? ?ED Course / MDM  ?  ?Medical Decision Making ?Amount and/or Complexity of Data Reviewed ?Labs: ordered. ?Radiology: ordered. ?ECG/medicine tests: ordered. ? ?Risk ?Prescription drug management. ? ? ?Accepted handoff at shift change from [facility] [place]. Please see prior provider note for more detail.  ? ?Briefly: Patient is 51 y.o. 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 ? ?DDX: concern for hypercalcemia ? ?Plan: Follow up on labs and consult oncology ? ?The patient is well appearing, watching television on my evaluation. His lungs have significant inspiratory and expiratory wheezing along with rhonchi. He has received 1 duoneb, 10mg  of Decadron, and  1L of fluids while here. The patient is tachycardic, but from looking at previous notes, this is the patient's baseline.  ? ?I have ordered an additional duoneb.  ? ?Spoke Dr. Burr Medico with oncology. Given the patient's hypercalcemia, she wants the patient admitted with NS infusion and Zometa 4mg  dose. Explained admission to patient who is amenable to admission. Will admit to medicine for oncology to follow up with tomorrow. Dr. Posey Pronto to admit.  ? ?I discussed this case with my attending physician who cosigned this note including patient's presenting symptoms, physical exam, and planned diagnostics and interventions. Attending physician stated agreement with plan or made changes to plan which were implemented.  ? ?Attending physician assessed patient at bedside. ? ? ?  ?Sherrell Puller, PA-C ?09/12/21 1730 ? ?  ?Daleen Bo, MD ?09/12/21 1741 ? ?

## 2021-09-12 NOTE — ED Provider Notes (Signed)
?  Face-to-face evaluation ? ? ?History: He is presenting for evaluation of cough, currently being treated with antibiotic.  He has renal cancer, metastatic to chest wall, with destructive bony lesions there.  He is currently not taking oral chemotherapy agent because of recurrent pancreatitis. ? ?Physical exam: Alert, appears older than stated age.  Occasional cough without sputum production.  Left chest wall tender.  Abdomen nontender.  He is tachypneic but there is no increased work of breathing. ? ?MDM: Evaluation for  ?Chief Complaint  ?Patient presents with  ? Cough  ?  ? ?Patient with symptoms consistent with bronchitis.  He has significant bony metastases to the chest, from renal cancer.  He has hypercalcemia secondary to destructive bony lesion.  Lactate is elevated likely secondary to cancer and metastatic disease.  Doubt severe sepsis, metabolic instability or impending vascular collapse.  Patient probably needs a dose of Xeloda, and follow-up with oncology for further care and treatment ? ?Medical screening examination/treatment/procedure(s) were conducted as a shared visit with non-physician practitioner(s) and myself.  I personally evaluated the patient during the encounter  ?  ?Daleen Bo, MD ?09/13/21 1525 ? ?

## 2021-09-12 NOTE — H&P (Addendum)
?History and Physical  ? ? ?Patient: Dylan Good LKG:[id number] DOB: 16-Aug-1970 ?DOA: 09/12/2021 ?DOS: the patient was seen and examined on 09/12/2021 ?PCP: System, Provider Not In  ?Patient coming from: Home ? ?Chief Complaint:  ?Chief Complaint  ?Patient presents with  ? Cough  ? ?HPI: Dylan Good is a 51 y.o. 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 ?Patient presented with complaints of progressively worsening cough and shortness of breath almost 1 week. ?No fever no chills reported by the patient. ?No sick contacts. ?No alcohol or drug abuse. ?No chest pain right now at the time of my evaluation. ?Patient denies any nausea or vomiting.  No abdominal pain.  No diarrhea no constipation.  No choking episode reported by the patient. ?Patient was started by PCP on cefdinir and so far has taken 3 capsules. ? ?Patient stopped taking his chemotherapy in end of January as instructed due to concerns with pancreatitis, but unfortunately has not resumed this medicine as of now, nor has a chance to see his primary oncologist in the clinic. ?Denies any other change in his medication. ? ? ?Review of Systems: As mentioned in the history of present illness. All other systems reviewed and are negative. ?Past Medical History:  ?Diagnosis Date  ? Cancer, metastatic to lung [facility])   ? Hypertension 07/01/2021  ? Renal cell adenocarcinoma, left ([place])   ? ?Past Surgical History:  ?Procedure Laterality Date  ? APPENDECTOMY    ? BIOPSY  07/03/2021  ? Procedure: BIOPSY;  Surgeon: Ronnette Juniper, MD;  Location: [facility];  Service: Gastroenterology;;  ? CHEST TUBE INSERTION Left 02/06/2021  ? Procedure: INSERTION PLEURAL DRAINAGE CATHETER;  Surgeon: Garner Nash, DO;  Location: [facility]COPY;  Service: Pulmonary;  Laterality: Left;  ? CHEST TUBE INSERTION N/A 04/04/2021  ? Procedure: removal of pleurex cath;  Surgeon: Garner Nash, DO;  Location: [facility] ENDOSCOPY;  Service: Pulmonary;  Laterality: N/A;  ? ESOPHAGOGASTRODUODENOSCOPY (EGD) WITH PROPOFOL N/A 07/03/2021  ? Procedure: ESOPHAGOGASTRODUODENOSCOPY (EGD) WITH PROPOFOL;  Surgeon: Ronnette Juniper, MD;  Location: [facility] ENDOSCOPY;  Service: Gastroenterology;  Laterality: N/A;  ? ?Social History:  reports that he has been smoking cigarettes. He has never used smokeless tobacco. He reports that he does not drink alcohol and does not use drugs. ? ?No Known Allergies ? ?Family History  ?Problem Relation Age of Onset  ? Lupus Mother   ? Cancer Other   ? ? ?Prior to Admission medications   ?Medication Sig Start Date End Date Taking? Authorizing Provider  ?albuterol (ACCUNEB) 0.63 MG/3ML nebulizer solution Take 1 ampule by nebulization 3 (three) times daily as needed for shortness of breath or wheezing. 09/11/21  Yes [provider]  ?amLODipine (NORVASC) 5 MG tablet TAKE 1 TABLET (5 MG TOTAL) BY MOUTH DAILY. 07/17/21  Yes [name], MD  ?D3-50 1.25 MG (50000 UT) capsule Take 50,000 Units by mouth once a week. Sundays 08/22/21  Yes [provider]  ?pantoprazole (PROTONIX) 40 MG tablet Take 1 tablet (40 mg total) by mouth daily. 08/14/21  Yes Eugenie Filler, MD  ?VENTOLIN HFA 108 (90 Base) MCG/ACT inhaler Inhale 2 puffs into the lungs every 4 (four) hours as needed for shortness of breath or wheezing. 09/11/21  Yes [provider]  ?nicotine (NICODERM CQ - DOSED IN MG/24 HOURS) 14 mg/24hr patch Place 1 patch (14 mg total) onto the skin daily. ?Patient not taking:  Reported on 09/12/2021 08/15/21   Eugenie Filler, MD  ?[facility] 80-4.5 MCG/ACT inhaler Inhale into the lungs. ?Patient not taking: Reported on 09/12/2021 09/11/21   [provider]  ? ? ?Physical Exam: ?Vitals:  ? 09/12/21 1630 09/12/21 1700 09/12/21 1730 09/12/21 1830  ?BP: (!) 142/88 138/83 (!) 144/96 113/72  ?Pulse: (!) 107 (!) 121 99 (!) 102  ?Resp: 19 (!) 21 19 16   ?Temp:    97.8 ?F (36.6 ?C)   ?TempSrc:    Oral  ?SpO2: 99% 96% 99% 99%  ?Weight:      ?Height:      ? ?General: Appear in mild distress, no Rash; Oral Mucosa Clear, moist. no Abnormal Neck Mass Or lumps, Conjunctiva normal  ?Cardiovascular: S1 and S2 Present, no Murmur, ?Respiratory: good respiratory effort, Bilateral Air entry present and CTA, no Crackles, Occasional wheezes ?Abdomen: Bowel Sound present, Soft and no tenderness ?Extremities: no Pedal edema ?Neurology: alert and oriented to time, place, and person ?affect appropriate. no new focal deficit ?Gait not checked due to patient safety concerns  ?Data Reviewed: ?\I have Reviewed nursing notes, Vitals, and Lab results since pt's last encounter. Pertinent lab results CBC and CMP ?I have ordered test including CBC and CMP ?I have independently visualized and interpreted imaging chest x-ray which showed no pneumonia. ?I have independently visualized and interpreted EKG which showed EKG: sinus tachycardia. ?I have reviewed the last note from oncology,  ?I have discussed pt's care plan and test results with EDP.  ? ?Assessment and Plan: ?* Hypercalcemia of malignancy ?Patient has received a dose of IV Zometa in the ER. ?Also currently receiving IV fluid. ?EDP has discussed with oncology who will evaluate the patient in the morning. ?Patient has stopped taking his cancer medication. ?Monitor on telemetry. ? ?Lung metastases ([place]) ?Suspect patient's current presentation with cough and shortness of breath is most likely associated with progression of lung metastasis. ? ?Kidney cancer, primary, with metastasis from kidney to other site [facility]) ?With brain and lung metastasis. ?Patient was on Cabometyx.  This was held for 1 week end of January due to concern with presentation with pancreatitis. ?Unfortunately patient had not seen oncologist since then. ?Will await recommendation from oncology with regards to further therapy. ? ?Hypertension ?Patient is on Norvasc. ?Blood pressure relatively  stable.  Monitor. ? ?Recent COVID-19 virus infection, possible bacterial pneumonia ?Recently diagnosed with COVID-19 in end of February. ?Treated briefly with Paxlovid which was stopped because of nausea.  ?Chest x-ray currently clear.  I do not see any evidence of pneumonia.  Examination shows patient does have bilateral expiratory wheezing.  I do not think that repeat COVID examination is necessary.  Most likely will be falsely positive. ?Continue cefdinir started by PCP outpatient for 3 more days. ?Monitor. ? ?Sinus tachycardia ?Chronic. ?Monitor. ?Continue IV hydration. ? ? ?Advance Care Planning: Full code.  Discussed with patient verified. ? ?Consults: Oncology ? ?Family Communication: Family was available on the phone at the time of my interview.  All questions answered. ? ?Severity of Illness: ?The appropriate patient status for this patient is OBSERVATION. Observation status is judged to be reasonable and necessary in order to provide the required intensity of service to ensure the patient's safety. The patient's presenting symptoms, physical exam findings, and initial radiographic and laboratory data in the context of their medical condition is felt to place them at decreased risk for further clinical deterioration. Furthermore, it is anticipated that the patient will be medically stable for discharge from  the hospital within 2 midnights of admission.  ? ?Author: ?Berle Mull, MD ?09/12/2021 6:42 PM ? ?For on call review [email or web address].  ?

## 2021-09-12 NOTE — ED Triage Notes (Signed)
Pt states he was diagnosed with pneumonia yesterday. Pt reports cough and shortness of breath today. ?

## 2021-09-12 NOTE — Assessment & Plan Note (Signed)
Patient is on Norvasc. ?Blood pressure relatively stable.  Monitor. ?

## 2021-09-12 NOTE — ED Provider Notes (Signed)
[facility] DEPT Provider Note   CSN: [id number] Arrival date & time: 09/12/21  1224     History  Chief Complaint  Patient presents with   Cough    KARSEN NAKANISHI is a 51 y.o. 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  Patient initially saw his primary care doctor yesterday and was prescribed an albuterol inhaler, nebulizer and a course of cefdinir p.o.  He is taken 3 capsules so far of his antibiotic, however he continues to decline feeling increasingly short of breath and wheezy.  We also endorses productive cough, fever that resolved with administration of Tylenol and posttussive vomiting.  No chest x-ray was done yesterday.  He was treated empirically.  He denies abdominal pain, nausea, diarrhea.  [facility] and information is limited on care everywhere.   Cough     Home Medications Prior to Admission medications   Medication Sig Start Date End Date Taking? Authorizing Provider  amLODipine (NORVASC) 5 MG tablet TAKE 1 TABLET (5 MG TOTAL) BY MOUTH DAILY. 07/17/21   [name], MD  diclofenac Sodium (VOLTAREN) 1 % GEL Apply 2 g topically 4 (four) times daily. Patient not taking: Reported on 08/28/2021 06/21/21   Raiford Noble Latif, DO  docusate sodium (COLACE) 100 MG capsule Take 1 capsule (100 mg total) by mouth 2 (two) times daily. Patient not taking: Reported on 08/12/2021 06/21/21   Raiford Noble Latif, DO  nicotine (NICODERM CQ - DOSED IN MG/24 HOURS) 14 mg/24hr patch Place 1 patch (14 mg total) onto the skin daily. 08/15/21   Eugenie Filler, MD  ondansetron (ZOFRAN) 4 MG tablet Take 1 tablet (4 mg total) by mouth every 6 (six) hours as needed for nausea. 08/14/21   Eugenie Filler, MD  oxyCODONE-acetaminophen (PERCOCET/ROXICET) 5-325 MG tablet  Take 1 tablet by mouth every 4 (four) hours as needed for moderate pain or severe pain. Patient not taking: Reported on 08/28/2021 07/06/21   Georgette Shell, MD  pantoprazole (PROTONIX) 40 MG tablet Take 1 tablet (40 mg total) by mouth daily. 08/14/21   Eugenie Filler, MD  polyethylene glycol (MIRALAX / GLYCOLAX) 17 g packet Take 17 g by mouth 2 (two) times daily. Patient not taking: Reported on 08/12/2021 07/06/21   Georgette Shell, MD      Allergies    Patient has no known allergies.    Review of Systems   Review of Systems  Respiratory:  Positive for cough.    Physical Exam Updated Vital Signs BP (!) 154/92    Pulse (!) 116    Temp 98.1 F (36.7 C)    Resp 20    Ht 5\' 9"  (1.753 m)    Wt 68 kg    SpO2 100%    BMI 22.15 kg/m  Physical Exam Vitals and nursing note reviewed.  Constitutional:      General: He is not in acute distress.    Appearance: He is ill-appearing.     Comments: Thin/cachectic  HENT:     Head: Atraumatic.  Eyes:     Conjunctiva/sclera: Conjunctivae normal.  Cardiovascular:     Rate and Rhythm: Regular rhythm. Tachycardia present.     Pulses: Normal pulses.     Heart sounds: No murmur heard. Pulmonary:  Effort: No respiratory distress.     Comments: Diffuse moderate wheezing in all lung fields with coarse lung sounds on the left side, increased respiratory effort. Abdominal:     General: Abdomen is flat. There is no distension.     Palpations: Abdomen is soft.     Tenderness: There is no abdominal tenderness.  Musculoskeletal:        General: Normal range of motion.     Cervical back: Normal range of motion.  Skin:    General: Skin is warm and dry.     Capillary Refill: Capillary refill takes less than 2 seconds.  Neurological:     General: No focal deficit present.     Mental Status: He is alert.  Psychiatric:        Mood and Affect: Mood normal.    ED Results / Procedures / Treatments   Labs (all labs ordered are listed, but  only abnormal results are displayed) Labs Reviewed  CULTURE, BLOOD (ROUTINE X 2)  CULTURE, BLOOD (ROUTINE X 2)  LACTIC ACID, PLASMA  LACTIC ACID, PLASMA  COMPREHENSIVE METABOLIC PANEL  CBC WITH DIFFERENTIAL/PLATELET  PROTIME-INR  APTT  URINALYSIS, ROUTINE W REFLEX MICROSCOPIC  TROPONIN I (HIGH SENSITIVITY)    EKG None  Radiology No results found.  Procedures .Critical Care Performed by: Tonye Pearson, PA-C Authorized by: Tonye Pearson, PA-C   Critical care provider statement:    Critical care time (minutes):  30   Critical care start time:  09/12/2021 1:30 PM   Critical care end time:  09/12/2021 2:00 PM   Critical care was necessary to treat or prevent imminent or life-threatening deterioration of the following conditions:  Endocrine crisis, metabolic crisis and respiratory failure   Critical care was time spent personally by me on the following activities:  Development of treatment plan with patient or surrogate, discussions with consultants, evaluation of patient's response to treatment, examination of patient, ordering and review of laboratory studies, ordering and review of radiographic studies, ordering and performing treatments and interventions, pulse oximetry, re-evaluation of patient's condition and review of old charts   I assumed direction of critical care for this patient from another provider in my specialty: no      Medications Ordered in ED Medications  ipratropium-albuterol (DUONEB) 0.5-2.5 (3) MG/3ML nebulizer solution 3 mL (has no administration in time range)  dexamethasone (DECADRON) injection 10 mg (has no administration in time range)  lactated ringers bolus 1,000 mL (has no administration in time range)    ED Course/ Medical Decision Making/ A&P                           Medical Decision Making Amount and/or Complexity of Data Reviewed Labs: ordered. Radiology: ordered. ECG/medicine tests: ordered.  Risk Prescription drug  management.   History:  Per HPI  Initial impression:  This patient presents to the ED for concern of cough and SOB, this involves an extensive number of treatment options, and is a complaint that carries with it a high risk of complications and morbidity.    This is a thin, chronically ill-appearing 51 year old male with history of metastatic renal cell carcinoma to the lung who presents for productive cough and shortness of breath for 4 to 5 days.  He is already been started on a course of cefdinir per his PCP.  On physical exam he has diffuse wheezing, coarse lung sounds in the left lung.  He is tachycardic to the  120s.  Oxygen is 100% on room air and he is afebrile.  Will obtain chest x-ray, septic labs and provide DuoNeb treatment along with Decadron 10 mg IM.   Lab Tests and EKG:  I Ordered, reviewed, and interpreted labs and EKG.  The pertinent results include:  CMP with critical calcium of 13.6 CBC without acute abnormalities Critical lactic acid of 2.2 Negative troponin Pending blood cultures and UA   Imaging Studies ordered:  I ordered imaging studies including  Chest x-ray which shows previously noted destructive left lung lesion with mild pleural effusions, no acute abnormalities I independently visualized and interpreted imaging and I agree with the radiologist interpretation.    Cardiac Monitoring:  The patient was maintained on a cardiac monitor.  I personally viewed and interpreted the cardiac monitored which showed an underlying rhythm of: Sinus tachycardia   Medicines ordered and prescription drug management:  I ordered medication including: DuoNeb for wheezing Decadron 10 mg for wheezing 1 L lactated ringer bolus Reevaluation of the patient after these medicines showed that the patient stayed the same I have reviewed the patients home medicines and have made adjustments as needed   Critical Interventions:  Endocrine and metabolic  crisis    Disposition:  Patient care handed off to [facility].  At time of shift change, patient's labs had just resulted with critical lactic acid and hypercalcemia.  Plan is to correct patient's hypercalcemia with likely admission.   Final Clinical Impression(s) / ED Diagnoses Final diagnoses:  None    Rx / DC Orders ED Discharge Orders     None         Rodena Piety 09/12/21 1537    Milton Ferguson, MD 09/12/21 1552

## 2021-09-12 NOTE — ED Provider Triage Note (Signed)
Emergency Medicine Provider Triage Evaluation Note ? ?Dylan Good , a 51 y.o. male  was evaluated in triage.  Pt complains of productive cough and chest pain "for the past few days". The patient is currently being treating for kidney cancer. He was recently seen and diagnosed with penumonia. ? ?Review of Systems  ?Positive: SOB, chest pain ?Negative: Abdominal pain, nausea.  ? ?Physical Exam  ?BP (!) 154/92   Pulse (!) 116   Temp 98.1 ?F (36.7 ?C)   Resp 20   Ht 5\' 9"  (1.753 m)   Wt 68 kg   SpO2 100%   BMI 22.15 kg/m?  ?Gen:   Awake, no distress   ?Resp:  Normal effort, rhonchi and wheezing. Speaking in full sentences.  ?MSK:   Moves extremities without difficulty  ?Other:   ? ?Medical Decision Making  ?Medically screening exam initiated at 12:37 PM.  Appropriate orders placed.  Dylan Good was informed that the remainder of the evaluation will be completed by another provider, this initial triage assessment does not replace that evaluation, and the importance of remaining in the ED until their evaluation is complete. ? ?Tachycardic on his discharge papers from yesterday. Concern for sepsis given tachycardia in the setting of cancer treatment ?  ?Sherrell Puller, PA-C ?09/12/21 1242 ? ?

## 2021-09-12 NOTE — Assessment & Plan Note (Addendum)
Recently diagnosed with COVID-19 in end of February. ?Treated briefly with Paxlovid which was stopped because of nausea.  ?Chest x-ray currently clear.  I do not see any evidence of pneumonia.  Examination shows patient does have bilateral expiratory wheezing. ?Completed cefdinir started by PCP outpatient ?Monitor. ?

## 2021-09-12 NOTE — Assessment & Plan Note (Addendum)
Ongoing severe cough ?Suspect patient's current presentation with cough and shortness of breath is most likely associated with progression of lung metastasis. ?Repeat X ray shows stability without any new acute cardiopulmonary issues.  ?Continue supportive care ?

## 2021-09-12 NOTE — Assessment & Plan Note (Addendum)
Patient has received a dose of IV Zometa in the ER. ?Also currently receiving IV fluid. ?Given a dose of calcitonin. ?Patient has stopped taking his cancer medication. ?Calcium level 10.6 right now.  We will continue with IV hydration for another day. ?

## 2021-09-12 NOTE — Assessment & Plan Note (Signed)
Chronic. ?Monitor. ?Continue IV hydration. ?

## 2021-09-13 ENCOUNTER — Encounter (HOSPITAL_COMMUNITY): Payer: Self-pay | Admitting: Internal Medicine

## 2021-09-13 ENCOUNTER — Other Ambulatory Visit: Payer: Self-pay

## 2021-09-13 DIAGNOSIS — I10 Essential (primary) hypertension: Secondary | ICD-10-CM | POA: Diagnosis present

## 2021-09-13 DIAGNOSIS — E860 Dehydration: Secondary | ICD-10-CM | POA: Diagnosis present

## 2021-09-13 DIAGNOSIS — Z79899 Other long term (current) drug therapy: Secondary | ICD-10-CM | POA: Diagnosis not present

## 2021-09-13 DIAGNOSIS — C7931 Secondary malignant neoplasm of brain: Secondary | ICD-10-CM | POA: Diagnosis present

## 2021-09-13 DIAGNOSIS — C7801 Secondary malignant neoplasm of right lung: Secondary | ICD-10-CM | POA: Diagnosis present

## 2021-09-13 DIAGNOSIS — J90 Pleural effusion, not elsewhere classified: Secondary | ICD-10-CM | POA: Diagnosis present

## 2021-09-13 DIAGNOSIS — F17210 Nicotine dependence, cigarettes, uncomplicated: Secondary | ICD-10-CM | POA: Diagnosis present

## 2021-09-13 DIAGNOSIS — C641 Malignant neoplasm of right kidney, except renal pelvis: Secondary | ICD-10-CM | POA: Diagnosis present

## 2021-09-13 DIAGNOSIS — C7802 Secondary malignant neoplasm of left lung: Secondary | ICD-10-CM | POA: Diagnosis present

## 2021-09-13 DIAGNOSIS — C7989 Secondary malignant neoplasm of other specified sites: Secondary | ICD-10-CM | POA: Diagnosis present

## 2021-09-13 DIAGNOSIS — R062 Wheezing: Secondary | ICD-10-CM | POA: Diagnosis not present

## 2021-09-13 DIAGNOSIS — Z8616 Personal history of COVID-19: Secondary | ICD-10-CM | POA: Diagnosis not present

## 2021-09-13 DIAGNOSIS — C7951 Secondary malignant neoplasm of bone: Secondary | ICD-10-CM | POA: Diagnosis present

## 2021-09-13 DIAGNOSIS — Z9049 Acquired absence of other specified parts of digestive tract: Secondary | ICD-10-CM | POA: Diagnosis not present

## 2021-09-13 LAB — COMPREHENSIVE METABOLIC PANEL
ALT: 14 U/L (ref 0–44)
AST: 14 U/L — ABNORMAL LOW (ref 15–41)
Albumin: 3.6 g/dL (ref 3.5–5.0)
Alkaline Phosphatase: 46 U/L (ref 38–126)
Anion gap: 6 (ref 5–15)
BUN: 10 mg/dL (ref 6–20)
CO2: 21 mmol/L — ABNORMAL LOW (ref 22–32)
Calcium: 12.8 mg/dL — ABNORMAL HIGH (ref 8.9–10.3)
Chloride: 107 mmol/L (ref 98–111)
Creatinine, Ser: 0.43 mg/dL — ABNORMAL LOW (ref 0.61–1.24)
GFR, Estimated: >60 mL/min (ref >60)
Glucose, Bld: 128 mg/dL — ABNORMAL HIGH (ref 70–99)
Potassium: 4.4 mmol/L (ref 3.5–5.1)
Sodium: 134 mmol/L — ABNORMAL LOW (ref 135–145)
Total Bilirubin: 0.5 mg/dL (ref 0.3–1.2)
Total Protein: 7.8 g/dL (ref 6.5–8.1)

## 2021-09-13 LAB — CBC
HCT: 32.9 % — ABNORMAL LOW (ref 39.0–52.0)
Hemoglobin: 10.6 g/dL — ABNORMAL LOW (ref 13.0–17.0)
MCH: 25.7 pg — ABNORMAL LOW (ref 26.0–34.0)
MCHC: 32.2 g/dL (ref 30.0–36.0)
MCV: 79.9 fL — ABNORMAL LOW (ref 80.0–100.0)
Platelets: 351 10*3/uL (ref 150–400)
RBC: 4.12 MIL/uL — ABNORMAL LOW (ref 4.22–5.81)
RDW: 14.6 % (ref 11.5–15.5)
WBC: 3 10*3/uL — ABNORMAL LOW (ref 4.0–10.5)
nRBC: 0 % (ref 0.0–0.2)

## 2021-09-13 LAB — BASIC METABOLIC PANEL
Anion gap: 10 (ref 5–15)
BUN: 10 mg/dL (ref 6–20)
CO2: 17 mmol/L — ABNORMAL LOW (ref 22–32)
Calcium: 12.4 mg/dL — ABNORMAL HIGH (ref 8.9–10.3)
Chloride: 110 mmol/L (ref 98–111)
Creatinine, Ser: 0.82 mg/dL (ref 0.61–1.24)
GFR, Estimated: >60 mL/min (ref >60)
Glucose, Bld: 139 mg/dL — ABNORMAL HIGH (ref 70–99)
Potassium: 4.5 mmol/L (ref 3.5–5.1)
Sodium: 137 mmol/L (ref 135–145)

## 2021-09-13 LAB — PARATHYROID HORMONE, INTACT (NO CA): PTH: 4 pg/mL — ABNORMAL LOW (ref 15–65)

## 2021-09-13 MED ORDER — CALCITONIN (SALMON) 200 UNIT/ML IJ SOLN
4.0000 [IU]/kg | Freq: Once | INTRAMUSCULAR | Status: AC
  Administered 2021-09-13: 16:00:00 272 [IU] via SUBCUTANEOUS
  Filled 2021-09-13: qty 1.36

## 2021-09-13 MED ORDER — HYDROCODONE BIT-HOMATROP MBR 5-1.5 MG/5ML PO SOLN
5.0000 mL | ORAL | Status: DC | PRN
  Administered 2021-09-13 – 2021-09-15 (×7): 5 mL via ORAL
  Filled 2021-09-13 (×7): qty 5

## 2021-09-13 NOTE — Progress Notes (Signed)
RN received message from Jiles Garter, RN with ID to place patient on COVID precautions due to COVID+ test on 08-28-21. ? ?Required precautions in place. ?

## 2021-09-13 NOTE — Progress Notes (Signed)
?  Progress Note ? ? ?Patient: Dylan Good UGQ:[id number] DOB: 06-Jul-1971 DOA: 09/12/2021     Hospitalization day: 0 ?DOS: the patient was seen and examined on 09/13/2021 ?  ?Brief hospital course: ?SY SAINTJEAN is a 51 y.o. 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 ?Presented with numbness or shortness of breath found to have hypercalcemia. ? ?Assessment and Plan: ?* Hypercalcemia of malignancy ?Patient has received a dose of IV Zometa in the ER. ?Also currently receiving IV fluid. ?We will give a dose of calcitonin. ?Patient has stopped taking his cancer medication. ?Monitor on telemetry. ? ?Lung metastases ([place]) ?Suspect patient's current presentation with cough and shortness of breath is most likely associated with progression of lung metastasis. ? ?Kidney cancer, primary, with metastasis from kidney to other site [facility]) ?With brain and lung metastasis. ?Patient was on Cabometyx.  This was held for 1 week end of January due to concern with presentation with pancreatitis. ?Unfortunately patient had not seen oncologist since then. ?Will await recommendation from oncology with regards to further therapy. ? ?Hypertension ?Patient is on Norvasc. ?Blood pressure relatively stable.  Monitor. ? ?Recent COVID-19 virus infection, possible bacterial pneumonia ?Recently diagnosed with COVID-19 in end of February. ?Treated briefly with Paxlovid which was stopped because of nausea.  ?Chest x-ray currently clear.  I do not see any evidence of pneumonia.  Examination shows patient does have bilateral expiratory wheezing.  I do not think that repeat COVID examination is necessary.  Most likely will be falsely positive. ?Continue cefdinir started by PCP outpatient for 3 more days. ?Monitor. ? ?Sinus tachycardia ?Chronic. ?Monitor. ?Continue IV hydration. ? ?Subjective: No nausea no vomiting.  Continues to have cough.  No  fever no chills.  No chest pain. ? ?Physical Exam: ?Vitals:  ? 09/13/21 0850 09/13/21 0851 09/13/21 1209 09/13/21 2033  ?BP:   118/69   ?Pulse:   100   ?Resp:   20   ?Temp:   98.4 ?F (36.9 ?C)   ?TempSrc:   Oral   ?SpO2: 97% 97% 100% 100%  ?Weight:      ?Height:      ? ?General: Appear in mild distress; no visible Abnormal Neck Mass Or lumps, Conjunctiva normal ?Cardiovascular: S1 and S2 Present, no Murmur, ?Respiratory: good respiratory effort, Bilateral Air entry present and  bilateral Crackles, bilateral wheezes ?Abdomen: Bowel Sound present ?Extremities: no Pedal edema ?Neurology: alert and oriented to time, place, and person ?Gait not checked due to patient safety concerns  ? ?Data Reviewed: ? I have Reviewed nursing notes, Vitals, and Lab results since pt's last encounter. Pertinent lab results CBC and BMP ?I have ordered test including CBC and BMP ?I have discussed pt's care plan and test results with oncology.  ? ?Family Communication: No family at bedside ? ?Disposition: ?Status is: Inpatient ?Remains inpatient appropriate because: Ongoing issues with hypercalcemia ?Author: ?Berle Mull, MD ?09/13/2021 9:24 PM ? ?For on call review [email or web address] ?

## 2021-09-13 NOTE — Hospital Course (Addendum)
Dylan Good is a 51 y.o. 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 ?Presented with numbness or shortness of breath found to have hypercalcemia. ?3/8 treated with Zometa and started on IV fluid. ?3/9 treated with IV calcitonin. Oncology consulted. ?3/10 continue IV fluids.  Calcium improving but not back to baseline. ?

## 2021-09-13 NOTE — Progress Notes (Signed)
IP PROGRESS NOTE ? ?Subjective:  ? ?Dylan Good is known to me with history of advanced renal cell carcinoma currently off treatment because of recurrent hospitalizations.  He was hospitalized recently on between February 21 and February 26 with acute pancreatitis and COVID-19 infections.  During his hospitalizations he underwent CT scan of the chest abdomen and pelvis which showed overall response and decrease in his cancer metastasis in the lung.  He was hospitalized again with symptoms of cough on September 12, 2021 and found to have elevated calcium.  He received Zometa and currently receiving intravenous hydration. ? ?Clinically, he denies any nausea, vomiting or abdominal pain.  He denies any diarrhea or bone pain.  He denies any falls or syncope. ? ?Objective: ? ?Vital signs in last 24 hours: ?Temp:  [97.8 ?F (36.6 ?C)-98.9 ?F (37.2 ?C)] 98.4 ?F (36.9 ?C) (03/09 1209) ?Pulse Rate:  [86-121] 100 (03/09 1209) ?Resp:  [11-26] 20 (03/09 1209) ?BP: (113-154)/(69-96) 118/69 (03/09 1209) ?SpO2:  [95 %-100 %] 100 % (03/09 1209) ?Weight:  [150 lb (68 kg)] 150 lb (68 kg) (03/08 1231) ?Weight change:  ?Last BM Date : 09/11/21 ? ?Intake/Output from previous day: ?03/08 0701 - 03/09 0700 ?In: 1240 [P.O.:240; IV Piggyback:1000] ?Out: -  ?General: Alert, awake without distress. ?Head: Normocephalic atraumatic. ?Mouth: mucous membranes moist, pharynx normal without lesions ?Eyes: No scleral icterus.  Pupils are equal and round reactive to light. ?Resp: clear to auscultation bilaterally without rhonchi or wheezes or dullness to percussion. ?Cardio: regular rate and rhythm, S1, S2 normal, no murmur, click, rub or gallop ?GI: soft, non-tender; bowel sounds normal; no masses,  no organomegaly ?Musculoskeletal: No joint deformity or effusion. ?Neurological: No motor, sensory deficits.  Intact deep tendon reflexes. ?Skin: No rashes or lesions. ? ? ?Lab Results: ?Recent Labs  ?  09/12/21 ?1243 09/13/21 ?0431  ?WBC 4.1 3.0*  ?HGB 11.7*  10.6*  ?HCT 37.4* 32.9*  ?PLT 313 351  ? ? ?BMET ?Recent Labs  ?  09/12/21 ?1243 09/12/21 ?2028 09/13/21 ?0431  ?NA 134*  --  134*  ?K 4.0  --  4.4  ?CL 102  --  107  ?CO2 27  --  21*  ?GLUCOSE 144*  --  128*  ?BUN 13  --  10  ?CREATININE 0.67  --  0.43*  ?CALCIUM 13.6* 13.4* 12.8*  ? ? ? ?IMPRESSION: ?1. No pulmonary embolus. ?2. Query slightly hazy contour of the pancreatic head. Correlate ?with lipase levels for acute pancreatitis. ?3. Grossly stable 12 cm right renal malignancy. ?4. Interval decrease in size of multiple known metastases including ?bilateral pulmonary metastases, left pleural metastases, hilar and ?mediastinal lymphadenopathy, as well as a left chest wall metastasis ?involving the left fifth rib. ?5. Grossly stable left adrenal gland metastasis. ?6. Grossly stable to possibly slightly decreased in size 2.1 cm left ?renal malignancy. ?  ?  ? ? ? ?Studies/Results: ?DG Chest 2 View ? ?Result Date: 09/12/2021 ?CLINICAL DATA:  Shortness of breath, cough, recent COVID positivity. Possible sepsis. EXAM: CHEST - 2 VIEW COMPARISON:  08/28/2021 and CT chest 08/28/2021. FINDINGS: Trachea is midline. Heart size normal. Mediastinal adenopathy and destructive left chest wall mass, better evaluated on CT chest 08/28/2021. Small left pleural effusion with pleural nodularity. Nodular areas of airspace consolidation bilaterally, worst in the left lower lobe. IMPRESSION: Mediastinal adenopathy, destructive left chest wall mass, small left pleural effusion/pleural nodularity and scattered areas of nodular airspace consolidation, all considered metastatic on CT chest 08/28/2021. No definite intervening acute findings.  Electronically Signed   By: Lorin Picket M.D.   On: 09/12/2021 13:15   ? ?Medications: I have reviewed the patient's current medications. ? ?Assessment/Plan: ? ?51 year old with: ? ?1.  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  His disease has been relatively stable and the  plan is to resume Cabometyx upon discharge from this hospitalization.  CT scan obtained on August 28, 2021 were personally reviewed and continues to show overall disease stability and response.  I do not see any evidence of cancer progression to explain his hypercalcemia. ? ?2.  Hypercalcemia: Likely related to malignancy and dehydration.  I agree with the current treatment approach.   ? ?3.  Wheezing and cough: Appears to be reactive airway disease related to his recent COVID-19 infection and unrelated to malignancy. ? ?4.  Disposition and follow-up we will arrange follow-up upon his discharge to address further cancer management. ? ? ?35  minutes were dedicated to this visit.  50% of time was spent face-to-face with the the time was spent on reviewing laboratory data, imaging studies, discussing treatment options, discussing differential diagnosis and answering questions regarding future plan. ? ? ? LOS: 0 days  ? ?Zola Button 09/13/2021, 12:17 PM ? ?

## 2021-09-13 NOTE — TOC Initial Note (Signed)
Transition of Care (TOC) - Initial/Assessment Note  ? ? ?Patient Details  ?Name: Dylan Good ?MRN: [id number] ?Date of Birth: 1970-08-01 ? ?Transition of Care (TOC) CM/SW Contact:    ?Tawanna Cooler, RN ?Phone Number: ?09/13/2021, 10:20 AM ? ?Clinical Narrative:                 ? ? ?Transition of Care (TOC) Screening Note ? ? ?Patient Details  ?Name: Dylan Good ?Date of Birth: 09/09/1970 ? ? ?Transition of Care (TOC) CM/SW Contact:    ?Tawanna Cooler, RN ?Phone Number: ?09/13/2021, 10:20 AM ? ? ? ?Transition of Care Department [facility]) has reviewed patient and no TOC needs have been identified at this time. We will continue to monitor patient advancement through interdisciplinary progression rounds. If new patient transition needs arise, please place a TOC consult. ?  ? ? ?Expected Discharge Plan: Home/Self Care ?Barriers to Discharge: Continued Medical Work up ? ? ? ?Expected Discharge Plan and Services ?Expected Discharge Plan: Home/Self Care ?  ?  ?  ?Living arrangements for the past 2 months: Apartment ?                ?  ?  ?Prior Living Arrangements/Services ?Living arrangements for the past 2 months: Apartment ?Lives with:: Significant Other ?Patient language and need for interpreter reviewed:: Yes ?Do you feel safe going back to the place where you live?: Yes      ?Need for Family Participation in Patient Care: Yes (Comment) ?Care giver support system in place?: Yes (comment) ?  ?Criminal Activity/Legal Involvement Pertinent to Current Situation/Hospitalization: No - Comment as needed ? ?Activities of Daily Living ?Home Assistive Devices/Equipment: None ?ADL Screening (condition at time of admission) ?Patient's cognitive ability adequate to safely complete daily activities?: Yes ?Is the patient deaf or have difficulty hearing?: No ?Does the patient have difficulty seeing, even when wearing glasses/contacts?: No ?Does the patient have difficulty concentrating, remembering, or making decisions?: No ?Patient  able to express need for assistance with ADLs?: Yes ?Does the patient have difficulty dressing or bathing?: No ?Independently performs ADLs?: Yes (appropriate for developmental age) ?Does the patient have difficulty walking or climbing stairs?: Yes ?Weakness of Legs: None ?Weakness of Arms/Hands: None ?  ?  ?Orientation: : Oriented to Self, Oriented to Place, Oriented to  Time, Oriented to Situation ?  ?Psych Involvement: No (comment) ? ?Admission diagnosis:  Wheezing [R06.2] ?Hypercalcemia of malignancy [E83.52] ?Patient Active Problem List  ? Diagnosis Date Noted  ? Hypercalcemia of malignancy 09/12/2021  ? Sinus tachycardia 09/12/2021  ? Neutropenia ([place]) 08/31/2021  ? Recent COVID-19 virus infection, possible bacterial pneumonia 08/29/2021  ? Pancreatitis 08/29/2021  ? Lung metastases ([place]) 08/13/2021  ? Intractable pain 07/01/2021  ? Hypertension 07/01/2021  ? Tobacco use 07/01/2021  ? Acute pancreatitis 07/01/2021  ? Pleural effusion on left 01/31/2021  ? Brain metastases ([place]) 01/24/2021  ? Hypercalcemia 01/15/2021  ? Kidney cancer, primary, with metastasis from kidney to other site [facility]) 12/29/2020  ? ?PCP:  System, Provider Not In ?Pharmacy:   ?[id number] - [place], [place] ?[address] ?[place] [address] ?Phone: [phone] Fax: [phone] ? ?[facility] ?[address] ?[place] [address] ?Phone: [phone] Fax: [phone] ? ? ?Readmission Risk Interventions ?No flowsheet data found. ? ? ?

## 2021-09-14 ENCOUNTER — Other Ambulatory Visit: Payer: Self-pay | Admitting: Oncology

## 2021-09-14 ENCOUNTER — Other Ambulatory Visit (HOSPITAL_COMMUNITY): Payer: Self-pay

## 2021-09-14 ENCOUNTER — Inpatient Hospital Stay: Payer: Medicaid Other

## 2021-09-14 ENCOUNTER — Inpatient Hospital Stay (HOSPITAL_COMMUNITY): Payer: Medicaid Other

## 2021-09-14 ENCOUNTER — Inpatient Hospital Stay: Payer: Medicaid Other | Admitting: Oncology

## 2021-09-14 LAB — CBC
HCT: 33.7 % — ABNORMAL LOW (ref 39.0–52.0)
Hemoglobin: 10.5 g/dL — ABNORMAL LOW (ref 13.0–17.0)
MCH: 25.5 pg — ABNORMAL LOW (ref 26.0–34.0)
MCHC: 31.2 g/dL (ref 30.0–36.0)
MCV: 82 fL (ref 80.0–100.0)
Platelets: 278 10*3/uL (ref 150–400)
RBC: 4.11 MIL/uL — ABNORMAL LOW (ref 4.22–5.81)
RDW: 14.9 % (ref 11.5–15.5)
WBC: 4.9 10*3/uL (ref 4.0–10.5)
nRBC: 0 % (ref 0.0–0.2)

## 2021-09-14 LAB — BASIC METABOLIC PANEL
Anion gap: 6 (ref 5–15)
BUN: 6 mg/dL (ref 6–20)
CO2: 20 mmol/L — ABNORMAL LOW (ref 22–32)
Calcium: 10.7 mg/dL — ABNORMAL HIGH (ref 8.9–10.3)
Chloride: 110 mmol/L (ref 98–111)
Creatinine, Ser: 0.42 mg/dL — ABNORMAL LOW (ref 0.61–1.24)
GFR, Estimated: >60 mL/min (ref >60)
Glucose, Bld: 105 mg/dL — ABNORMAL HIGH (ref 70–99)
Potassium: 3.4 mmol/L — ABNORMAL LOW (ref 3.5–5.1)
Sodium: 136 mmol/L (ref 135–145)

## 2021-09-14 LAB — MAGNESIUM: Magnesium: 1.6 mg/dL — ABNORMAL LOW (ref 1.7–2.4)

## 2021-09-14 IMAGING — DX DG CHEST 1V PORT
2 series · 2 of 2 positions shown · non-contrast
Comparison: [DATE] [DATE], [DATE].  [DATE] [DATE], [DATE].

CLINICAL DATA: Cough, shortness of breath.

EXAM:
PORTABLE CHEST 1 VIEW

[chest ap (1 of 2)]
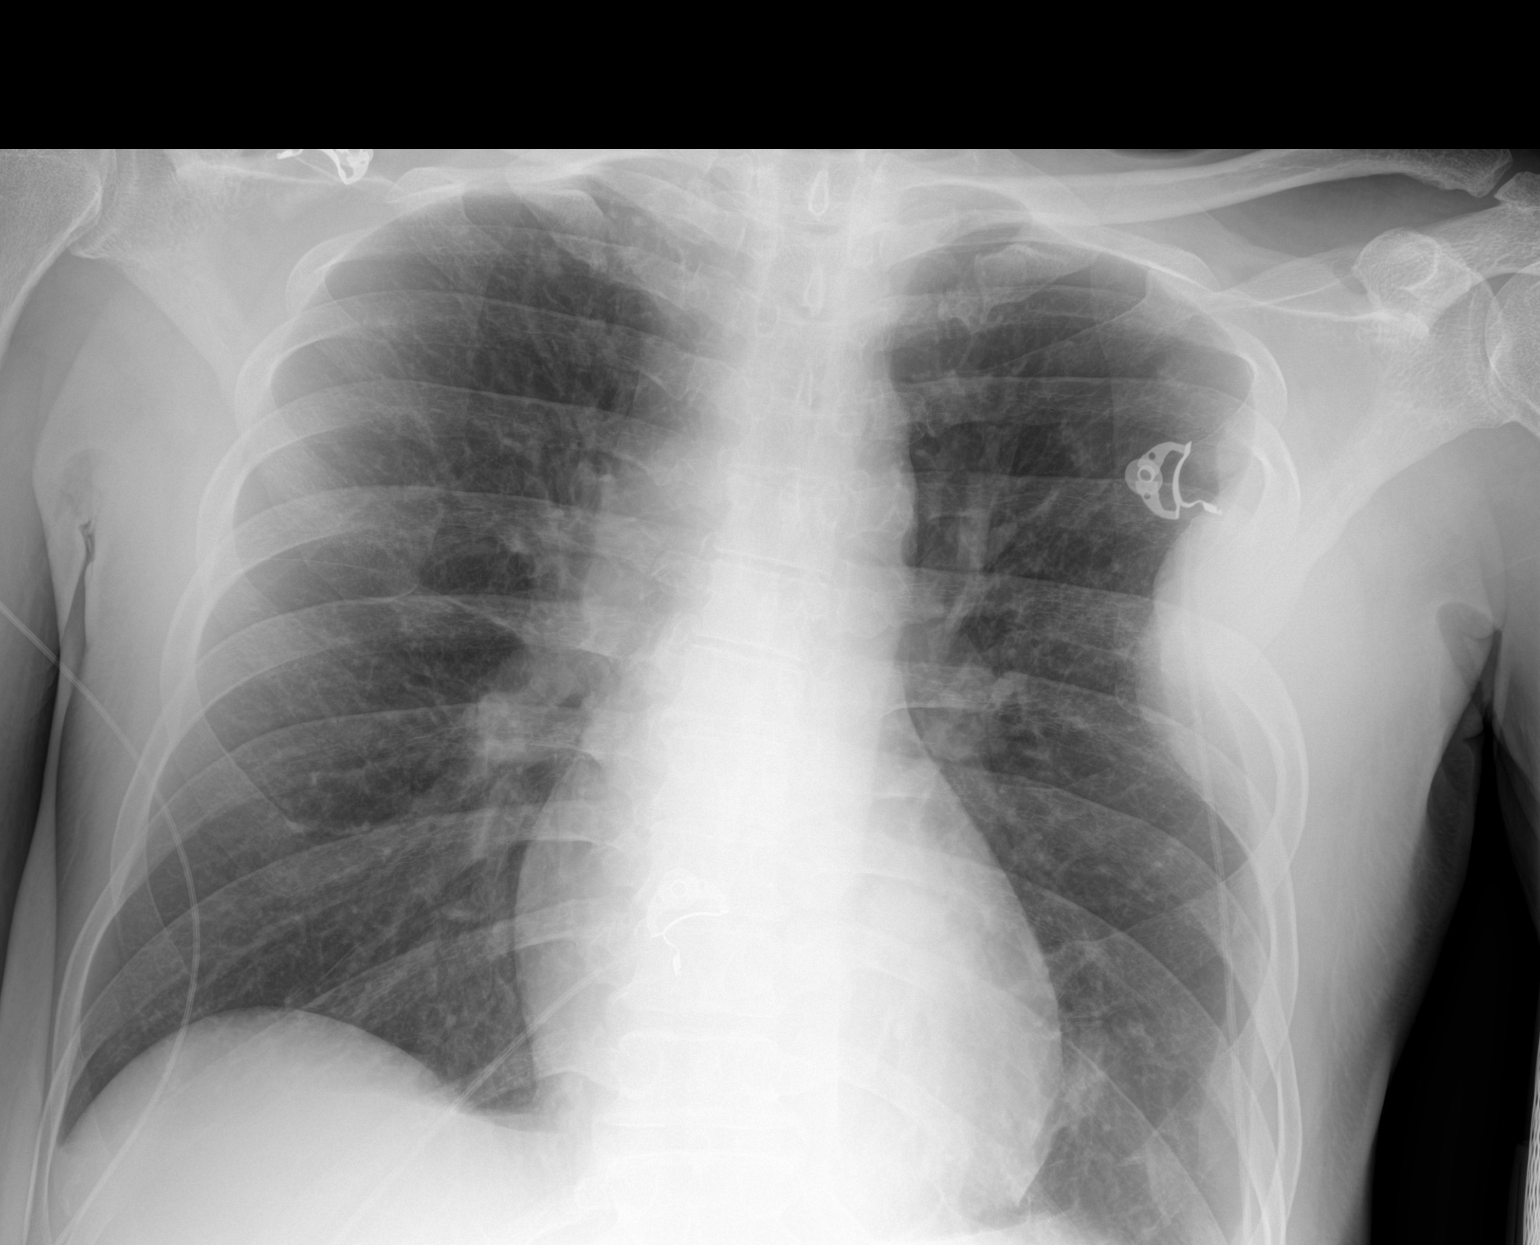

[chest ap (2 of 2)]
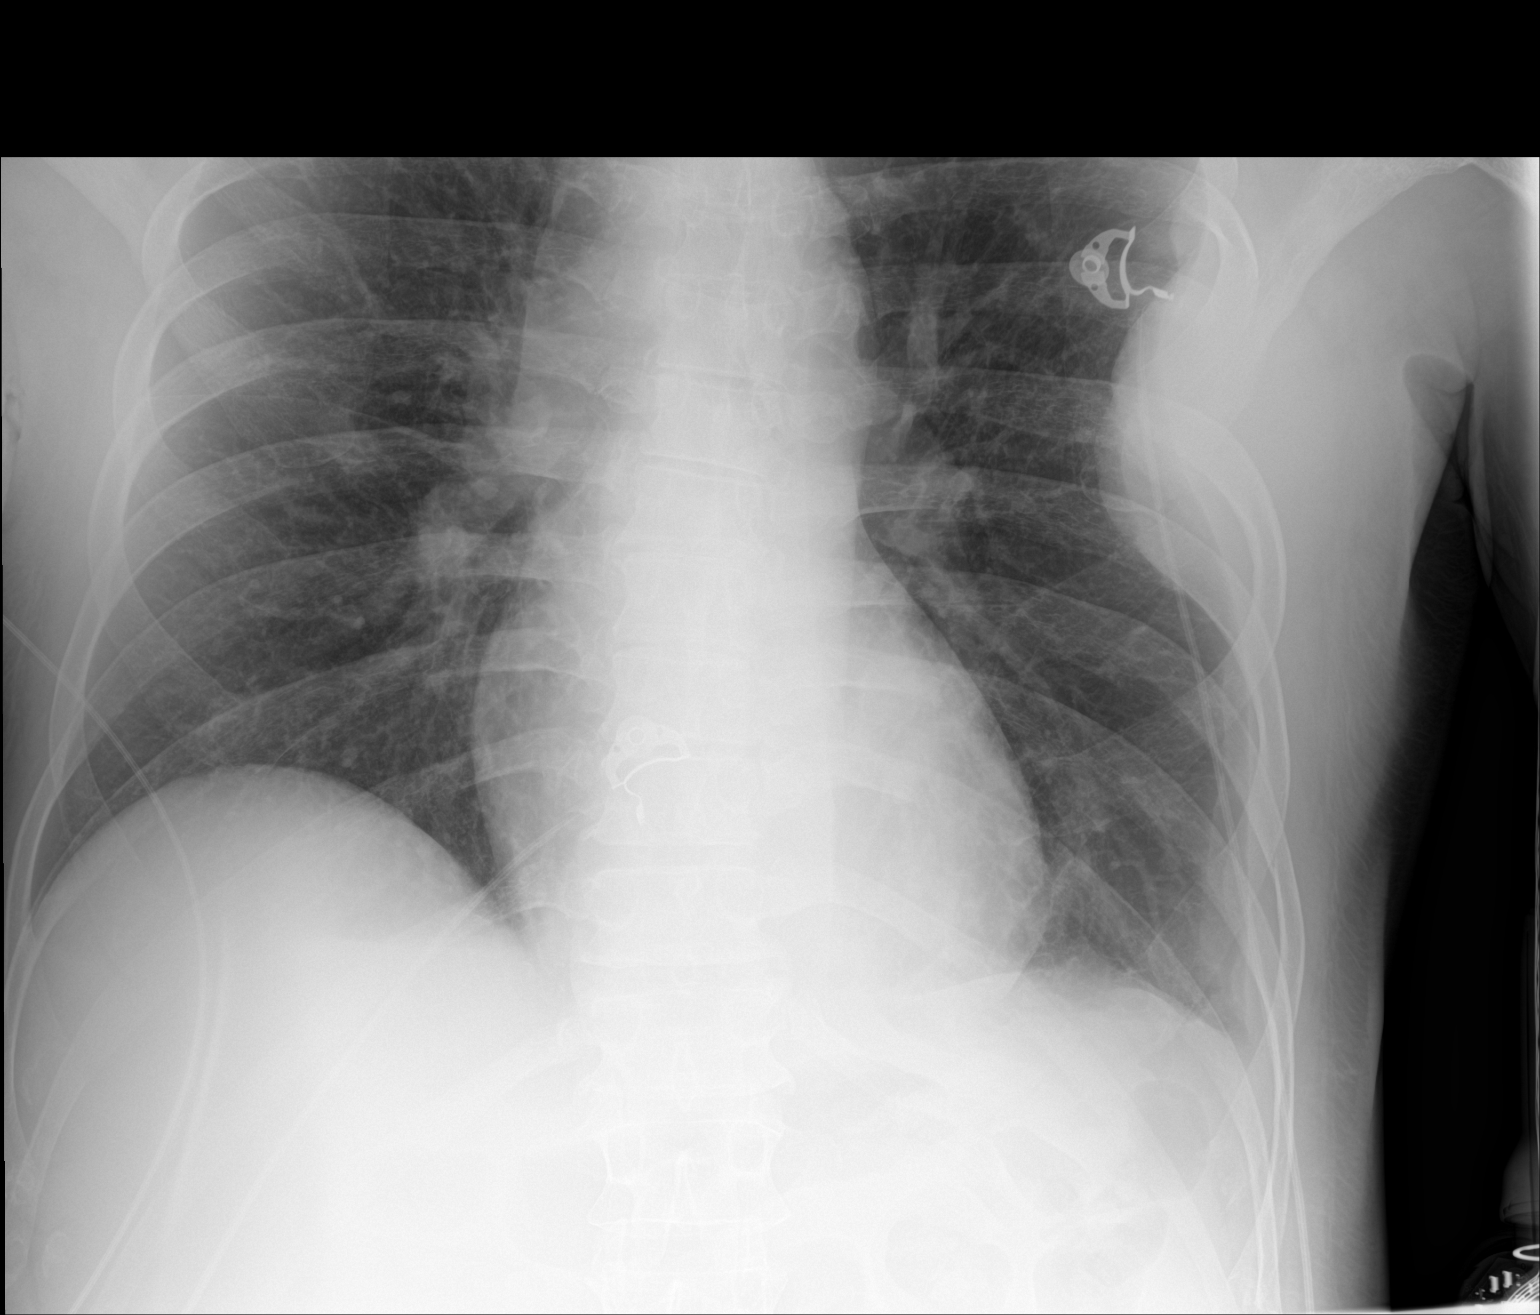

[2 of 2 positions shown; findings below may reference images not displayed]

FINDINGS: Stable right paratracheal adenopathy is noted. Normal cardiac size.
Stable left large pleural based mass with destruction of rib
consistent with metastatic disease as noted on prior CT. Right lung
is clear. Minimal left pleural effusion or pleural thickening is
noted.
IMPRESSION: Grossly stable findings compared to prior exam.

## 2021-09-14 MED ORDER — CABOMETYX 40 MG PO TABS
40.0000 mg | ORAL_TABLET | Freq: Every day | ORAL | 1 refills | Status: DC
  Filled 2021-09-14: qty 30, 30d supply, fill #0

## 2021-09-14 MED ORDER — POTASSIUM CHLORIDE CRYS ER 20 MEQ PO TBCR
40.0000 meq | EXTENDED_RELEASE_TABLET | ORAL | Status: AC
  Administered 2021-09-14 (×2): 40 meq via ORAL
  Filled 2021-09-14 (×2): qty 2

## 2021-09-14 MED ORDER — MAGNESIUM SULFATE 2 GM/50ML IV SOLN
2.0000 g | Freq: Once | INTRAVENOUS | Status: AC
  Administered 2021-09-14: 2 g via INTRAVENOUS
  Filled 2021-09-14: qty 50

## 2021-09-14 NOTE — Progress Notes (Signed)
?  Progress Note ? ? ?Patient: Dylan Good [id number]:[id number] DOB: [birth year] DOA: 09/12/2021     Hospitalization day: 1 ?DOS: the patient was seen and examined on 09/14/2021 ? ?Brief hospital course: ?TALMADGE GANAS is a 51 y.o. 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 ?Presented with numbness or shortness of breath found to have hypercalcemia. ?3/8 treated with Zometa and started on IV fluid. ?3/9 treated with IV calcitonin. Oncology consulted. ?3/10 continue IV fluids.  Calcium improving but not back to baseline. ? ?Assessment and Plan: ?* Hypercalcemia of malignancy ?Patient has received a dose of IV Zometa in the ER. ?Also currently receiving IV fluid. ?Given a dose of calcitonin. ?Patient has stopped taking his cancer medication. ?Calcium level improving but not close to his baseline ? ?Lung metastases ([place]) ?Ongoing severe cough ?Suspect patient's current presentation with cough and shortness of breath is most likely associated with progression of lung metastasis. ?Repeat X ray shows stability without any new acute cardiopulmonary issues.  ?Continue supportive care ? ?Kidney cancer, primary, with metastasis from kidney to other site [facility]) ?With brain and lung metastasis. ?Patient was on Cabometyx.  This was held for 1 week end of January due to concern with presentation with pancreatitis. ?Unfortunately patient had not seen oncologist since then. ?Oncology recommends to resume cabometyx on discharge. Informed Oncology that the pt has ran out of this med and will need a new script.  ? ?Hypertension ?Patient is on Norvasc. ?Blood pressure relatively stable.  Monitor. ? ?Recent COVID-19 virus infection, possible bacterial pneumonia ?Recently diagnosed with COVID-19 in end of February. ?Treated briefly with Paxlovid which was stopped because of nausea.  ?Chest x-ray currently clear.  I do not see any evidence  of pneumonia.  Examination shows patient does have bilateral expiratory wheezing. ?Completed cefdinir started by PCP outpatient ?Monitor. ? ?Sinus tachycardia ?Chronic. ?Monitor. ?Continue IV hydration. ? ? ? ? ? ? ? ?Subjective: Continues to have cough.  No chest pain.  No nausea no vomiting.  No diarrhea no constipation. ? ?Physical Exam: ?Vitals:  ? 09/14/21 0549 09/14/21 0834 09/14/21 0837 09/14/21 1356  ?BP: 115/68   111/74  ?Pulse: 83   93  ?Resp: 20  15 20   ?Temp: 97.9 ?F (36.6 ?C)   97.9 ?F (36.6 ?C)  ?TempSrc: Oral   Oral  ?SpO2: 96% 96% 95% 100%  ?Weight:      ?Height:      ? ?General: Appear in mild distress; no visible Abnormal Neck Mass Or lumps, Conjunctiva normal ?Cardiovascular: S1 and S2 Present, no Murmur, ?Respiratory: increased respiratory effort, Bilateral Air entry present and  bilateral Crackles, bilateral expiratory wheezes ?Abdomen: Bowel Sound present, non tender ?Extremities: no Pedal edema ?Neurology: alert and oriented to time, place, and person ?Gait not checked due to patient safety concerns  ? ?Data Reviewed: ?I have Reviewed nursing notes, Vitals, and Lab results since pt's last encounter. Pertinent lab results CBC CMP ?I have ordered test including CBC CMP ?I have reviewed the last note from Oncology,  ?I have discussed pt's care plan and test results with Oncology.  ? ?Family Communication: none at bedside ? ?Disposition: ?Status is: Inpatient ?Remains inpatient appropriate because: Still suffering from hypercalcemia. ? ?Author: ?Berle Mull, MD ?09/14/2021 5:18 PM ? ?For on call review [email or web address] ?

## 2021-09-15 LAB — BASIC METABOLIC PANEL
Anion gap: 6 (ref 5–15)
BUN: <5 mg/dL — ABNORMAL LOW (ref 6–20)
CO2: 20 mmol/L — ABNORMAL LOW (ref 22–32)
Calcium: 10.6 mg/dL — ABNORMAL HIGH (ref 8.9–10.3)
Chloride: 110 mmol/L (ref 98–111)
Creatinine, Ser: 0.51 mg/dL — ABNORMAL LOW (ref 0.61–1.24)
GFR, Estimated: >60 mL/min (ref >60)
Glucose, Bld: 97 mg/dL (ref 70–99)
Potassium: 3.8 mmol/L (ref 3.5–5.1)
Sodium: 136 mmol/L (ref 135–145)

## 2021-09-15 LAB — CBC
HCT: 34.9 % — ABNORMAL LOW (ref 39.0–52.0)
Hemoglobin: 10.8 g/dL — ABNORMAL LOW (ref 13.0–17.0)
MCH: 25.5 pg — ABNORMAL LOW (ref 26.0–34.0)
MCHC: 30.9 g/dL (ref 30.0–36.0)
MCV: 82.3 fL (ref 80.0–100.0)
Platelets: 305 10*3/uL (ref 150–400)
RBC: 4.24 MIL/uL (ref 4.22–5.81)
RDW: 15.2 % (ref 11.5–15.5)
WBC: 5.1 10*3/uL (ref 4.0–10.5)
nRBC: 0 % (ref 0.0–0.2)

## 2021-09-15 LAB — MAGNESIUM: Magnesium: 1.5 mg/dL — ABNORMAL LOW (ref 1.7–2.4)

## 2021-09-15 MED ORDER — MAGNESIUM SULFATE 4 GM/100ML IV SOLN
4.0000 g | Freq: Once | INTRAVENOUS | Status: AC
  Administered 2021-09-15: 4 g via INTRAVENOUS
  Filled 2021-09-15: qty 100

## 2021-09-15 MED ORDER — GUAIFENESIN 100 MG/5ML PO LIQD
10.0000 mL | ORAL | Status: DC | PRN
  Administered 2021-09-16: 10 mL via ORAL
  Filled 2021-09-15: qty 10

## 2021-09-15 MED ORDER — IPRATROPIUM-ALBUTEROL 0.5-2.5 (3) MG/3ML IN SOLN
3.0000 mL | Freq: Four times a day (QID) | RESPIRATORY_TRACT | Status: DC
  Administered 2021-09-15 – 2021-09-16 (×3): 3 mL via RESPIRATORY_TRACT
  Filled 2021-09-15 (×3): qty 3

## 2021-09-15 MED ORDER — MAGNESIUM OXIDE -MG SUPPLEMENT 400 (240 MG) MG PO TABS
400.0000 mg | ORAL_TABLET | Freq: Two times a day (BID) | ORAL | Status: DC
  Administered 2021-09-16: 400 mg via ORAL
  Filled 2021-09-15: qty 1

## 2021-09-15 MED ORDER — BENZONATATE 100 MG PO CAPS
200.0000 mg | ORAL_CAPSULE | Freq: Three times a day (TID) | ORAL | Status: DC
  Administered 2021-09-15 – 2021-09-16 (×2): 200 mg via ORAL
  Filled 2021-09-15 (×2): qty 2

## 2021-09-15 MED ORDER — HYDROCOD POLI-CHLORPHE POLI ER 10-8 MG/5ML PO SUER
5.0000 mL | Freq: Two times a day (BID) | ORAL | Status: DC
  Administered 2021-09-15 – 2021-09-16 (×2): 5 mL via ORAL
  Filled 2021-09-15 (×2): qty 5

## 2021-09-15 MED ORDER — ALBUTEROL SULFATE (2.5 MG/3ML) 0.083% IN NEBU: 2.5000 mg | INHALATION_SOLUTION | RESPIRATORY_TRACT | Status: DC | PRN

## 2021-09-15 NOTE — Progress Notes (Signed)
?  Progress Note ? ? ?Patient: Dylan Good JEH:[id number] DOB: [birth year] DOA: 09/12/2021     Hospitalization day: 2 ?DOS: the patient was seen and examined on 09/15/2021 ? ?Brief hospital course: ?Dylan Good is a 51 y.o. 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 ?Presented with numbness or shortness of breath found to have hypercalcemia. ?3/8 treated with Zometa and started on IV fluid. ?3/9 treated with IV calcitonin. Oncology consulted. ?3/10 continue IV fluids.  Calcium improving but not back to baseline. ? ?Assessment and Plan: ?* Hypercalcemia of malignancy ?Patient has received a dose of IV Zometa in the ER. ?Also currently receiving IV fluid. ?Given a dose of calcitonin. ?Patient has stopped taking his cancer medication. ?Calcium level 10.6 right now.  We will continue with IV hydration for another day. ? ?Lung metastases ([place]) ?Ongoing severe cough ?Suspect patient's current presentation with cough and shortness of breath is most likely associated with progression of lung metastasis. ?Repeat X ray shows stability without any new acute cardiopulmonary issues.  ?Continue supportive care ? ?Kidney cancer, primary, with metastasis from kidney to other site [facility]) ?With brain and lung metastasis. ?Patient was on Cabometyx.  This was held for 1 week end of January due to concern with presentation with pancreatitis. ?Unfortunately patient had not seen oncologist since then. ?Oncology recommends to resume cabometyx on discharge. Informed Oncology that the pt has ran out of this med and will need a new script.  ? ?Hypertension ?Patient is on Norvasc. ?Blood pressure relatively stable.  Monitor. ? ?Recent COVID-19 virus infection, possible bacterial pneumonia ?Recently diagnosed with COVID-19 in end of February. ?Treated briefly with Paxlovid which was stopped because of nausea.  ?Chest x-ray currently clear.   I do not see any evidence of pneumonia.  Examination shows patient does have bilateral expiratory wheezing. ?Completed cefdinir started by PCP outpatient ?Monitor. ? ?Sinus tachycardia ?Chronic. ?Monitor. ?Continue IV hydration. ? ?Subjective: No nausea no vomiting no fever no chills.  Continues to have cough. ? ?Physical Exam: ?Vitals:  ? 09/15/21 0100 09/15/21 0523 09/15/21 0834 09/15/21 1416  ?BP: 114/72 120/66  115/68  ?Pulse: 84 84  87  ?Resp: 18 18  16   ?Temp: 98 ?F (36.7 ?C) 98.2 ?F (36.8 ?C)  98.4 ?F (36.9 ?C)  ?TempSrc: Oral Oral  Oral  ?SpO2: 98% 99% 97% 98%  ?Weight:      ?Height:      ? ?General: Appear in mild distress; no visible Abnormal Neck Mass Or lumps, Conjunctiva normal ?Cardiovascular: S1 and S2 Present, no Murmur, ?Respiratory: increased respiratory effort, Bilateral Air entry present and  no Crackles, bilateral expiratory wheezes ?Abdomen: Bowel Sound present, nontender ?Extremities: no Pedal edema ?Neurology: alert and oriented to time, place, and person ?Gait not checked due to patient safety concerns  ? ?Data Reviewed: ?I have Reviewed nursing notes, Vitals, and Lab results since pt's last encounter. Pertinent lab results CBC and BMP ?I have ordered test including CBC and BMP   ? ?Family Communication: Family at bedside ? ?Disposition: ?Status is: Inpatient ?Remains inpatient appropriate because: Ongoing hypercalcemia requiring IV therapy. ? ?Author: ?Berle Mull, MD ?09/15/2021 6:48 PM ? ?For on call review [email or web address] ?

## 2021-09-16 LAB — BASIC METABOLIC PANEL
Anion gap: 6 (ref 5–15)
BUN: <5 mg/dL — ABNORMAL LOW (ref 6–20)
CO2: 21 mmol/L — ABNORMAL LOW (ref 22–32)
Calcium: 10.4 mg/dL — ABNORMAL HIGH (ref 8.9–10.3)
Chloride: 110 mmol/L (ref 98–111)
Creatinine, Ser: 0.45 mg/dL — ABNORMAL LOW (ref 0.61–1.24)
GFR, Estimated: >60 mL/min (ref >60)
Glucose, Bld: 111 mg/dL — ABNORMAL HIGH (ref 70–99)
Potassium: 3.4 mmol/L — ABNORMAL LOW (ref 3.5–5.1)
Sodium: 137 mmol/L (ref 135–145)

## 2021-09-16 LAB — CBC
HCT: 33.9 % — ABNORMAL LOW (ref 39.0–52.0)
Hemoglobin: 10.6 g/dL — ABNORMAL LOW (ref 13.0–17.0)
MCH: 25.4 pg — ABNORMAL LOW (ref 26.0–34.0)
MCHC: 31.3 g/dL (ref 30.0–36.0)
MCV: 81.3 fL (ref 80.0–100.0)
Platelets: 278 10*3/uL (ref 150–400)
RBC: 4.17 MIL/uL — ABNORMAL LOW (ref 4.22–5.81)
RDW: 14.8 % (ref 11.5–15.5)
WBC: 4.9 10*3/uL (ref 4.0–10.5)
nRBC: 0 % (ref 0.0–0.2)

## 2021-09-16 LAB — MAGNESIUM: Magnesium: 1.8 mg/dL (ref 1.7–2.4)

## 2021-09-16 MED ORDER — POLYETHYLENE GLYCOL 3350 17 G PO PACK
17.0000 g | PACK | Freq: Every day | ORAL | 0 refills | Status: DC | PRN
Start: 2021-09-16 — End: 2021-11-22

## 2021-09-16 MED ORDER — DM-GUAIFENESIN ER 30-600 MG PO TB12: 1.0000 | ORAL_TABLET | Freq: Two times a day (BID) | ORAL | 0 refills | Status: DC

## 2021-09-16 MED ORDER — BENZONATATE 200 MG PO CAPS: 200.0000 mg | ORAL_CAPSULE | Freq: Three times a day (TID) | ORAL | 0 refills | Status: DC

## 2021-09-16 MED ORDER — ONDANSETRON HCL 4 MG PO TABS: 4.0000 mg | ORAL_TABLET | Freq: Four times a day (QID) | ORAL | 0 refills | Status: DC | PRN

## 2021-09-16 MED ORDER — HYDROCOD POLI-CHLORPHE POLI ER 10-8 MG/5ML PO SUER: 5.0000 mL | Freq: Two times a day (BID) | ORAL | 0 refills | Status: DC

## 2021-09-16 MED ORDER — MAGNESIUM OXIDE -MG SUPPLEMENT 400 (240 MG) MG PO TABS: 400.0000 mg | ORAL_TABLET | Freq: Every day | ORAL | 0 refills | Status: DC

## 2021-09-16 NOTE — Plan of Care (Signed)
POC adequate for dc ?

## 2021-09-16 NOTE — Discharge Summary (Signed)
Physician Discharge Summary   Patient: Dylan Good MRN: [id number] DOB: 1971-05-06  Admit date:     09/12/2021  Discharge date: 09/16/2021  Discharge Physician: Lynden Oxford  PCP: System, Provider Not In  Recommendations at discharge: Follow-up with oncology as recommended.  Discharge Diagnoses: Principal Problem:   Hypercalcemia of malignancy Active Problems:   Kidney cancer, primary, with metastasis from kidney to other site [facility])   Brain metastases (HCC)   Pleural effusion on left   Lung metastases (HCC)   Hypertension   Recent COVID-19 virus infection, possible bacterial pneumonia   Sinus tachycardia   Hospital Course: Dylan Good is a 51 y.o. 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 Presented with numbness or shortness of breath found to have hypercalcemia. 3/8 treated with Zometa and started on IV fluid. 3/9 treated with IV calcitonin. Oncology consulted. 3/10 continue IV fluids.  Calcium improving but not back to baseline.  Assessment and Plan: * Hypercalcemia of malignancy Patient has received a dose of IV Zometa in the ER. Also currently receiving IV fluid. Given a dose of calcitonin. Patient has stopped taking his cancer medication. Calcium level 10.6 right now.  Recommend to continue to hydrate at home.  Lung metastases (HCC) Ongoing severe cough Suspect patient's current presentation with cough and shortness of breath is most likely associated with progression of lung metastasis. Repeat X ray shows stability without any new acute cardiopulmonary issues.  Continue supportive care  Kidney cancer, primary, with metastasis from kidney to other site [facility]) With brain and lung metastasis. Patient was on Cabometyx.  This was held for 1 week end of January due to concern with presentation with pancreatitis. Unfortunately patient had not seen oncologist  since then. Oncology recommends to resume cabometyx on discharge.  Oncology prescribed a new prescription.  Hypertension Patient is on Norvasc. Blood pressure relatively stable.  Monitor.  Recent COVID-19 virus infection, possible bacterial pneumonia Recently diagnosed with COVID-19 in end of February. Treated briefly with Paxlovid which was stopped because of nausea.  Chest x-ray currently clear.  I do not see any evidence of pneumonia.  Examination shows patient does have bilateral expiratory wheezing. Completed cefdinir started by PCP outpatient Monitor.  Sinus tachycardia Chronic.  Pain control - [employer] Controlled Substance Reporting System database was reviewed. and patient was instructed, not to drive, operate heavy machinery, perform activities at heights, swimming or participation in water activities or provide baby-sitting services while on Pain, Sleep and Anxiety Medications; until their outpatient Physician has advised to do so again. Also recommended to not to take more than prescribed Pain, Sleep and Anxiety Medications.   Consultants: Oncology Procedures performed:  none DISCHARGE MEDICATION: Allergies as of 09/16/2021   No Known Allergies      Medication List     STOP taking these medications    Symbicort 80-4.5 MCG/ACT inhaler Generic drug: budesonide-formoterol       TAKE these medications    albuterol 0.63 MG/3ML nebulizer solution Commonly known as: ACCUNEB Take 1 ampule by nebulization 3 (three) times daily as needed for shortness of breath or wheezing.   Ventolin HFA 108 (90 Base) MCG/ACT inhaler Generic drug: albuterol Inhale 2 puffs into the lungs every 4 (four) hours as needed for shortness of breath or wheezing.   amLODipine 5 MG tablet Commonly known as: NORVASC TAKE 1 TABLET (5 MG TOTAL) BY MOUTH DAILY.   benzonatate 200 MG capsule  Commonly known as: TESSALON Take 1 capsule (200 mg total) by mouth 3 (three) times daily.    Cabometyx 40 MG tablet Generic drug: cabozantinib Take 1 tablet (40 mg total) by mouth daily. Take on an empty stomach, 1 hour before or 2 hours after meals.   chlorpheniramine-HYDROcodone 10-8 MG/5ML Take 5 mLs by mouth every 12 (twelve) hours.   D3-50 1.25 MG (50000 UT) capsule Generic drug: Cholecalciferol Take 50,000 Units by mouth once a week. Sundays   dextromethorphan-guaiFENesin 30-600 MG 12hr tablet Commonly known as: MUCINEX DM Take 1 tablet by mouth 2 (two) times daily.   magnesium oxide 400 (240 Mg) MG tablet Commonly known as: MAG-OX Take 1 tablet (400 mg total) by mouth daily.   nicotine 14 mg/24hr patch Commonly known as: NICODERM CQ - dosed in mg/24 hours Place 1 patch (14 mg total) onto the skin daily.   ondansetron 4 MG tablet Commonly known as: ZOFRAN Take 1 tablet (4 mg total) by mouth every 6 (six) hours as needed for nausea.   pantoprazole 40 MG tablet Commonly known as: Protonix Take 1 tablet (40 mg total) by mouth daily.   polyethylene glycol 17 g packet Commonly known as: MIRALAX / GLYCOLAX Take 17 g by mouth daily as needed for mild constipation.        Follow-up Information     Shadad, Firas N, MD. Schedule an appointment as soon as possible for a visit in 1 week(s).   Specialty: Oncology Contact information: [address] [place] [address] [phone]                Disposition: Home Diet recommendation:  Discharge Diet Orders (From admission, onward)     Start     Ordered   09/16/21 0000  Diet - low sodium heart healthy        03 /12/23 0907           Discharge Exam: Ceasar Mons Weights   09/12/21 1231  Weight: 68 kg   General: Appear in mild distress; no visible Abnormal Neck Mass Or lumps, Conjunctiva normal Cardiovascular: S1 and S2 Present, no Murmur, Respiratory: good respiratory effort, Bilateral Air entry present and  no Crackles, bilateral expiratory wheezes Abdomen: Bowel Sound present  nontender Extremities: no Pedal edema Neurology: alert and oriented to time, place, and person Gait not checked due to patient safety concerns   Condition at discharge: good  The results of significant diagnostics from this hospitalization (including imaging, microbiology, ancillary and laboratory) are listed below for reference.   Imaging Studies: DG Chest 2 View  Result Date: 09/12/2021 CLINICAL DATA:  Shortness of breath, cough, recent COVID positivity. Possible sepsis. EXAM: CHEST - 2 VIEW COMPARISON:  08/28/2021 and CT chest 08/28/2021. FINDINGS: Trachea is midline. Heart size normal. Mediastinal adenopathy and destructive left chest wall mass, better evaluated on CT chest 08/28/2021. Small left pleural effusion with pleural nodularity. Nodular areas of airspace consolidation bilaterally, worst in the left lower lobe. IMPRESSION: Mediastinal adenopathy, destructive left chest wall mass, small left pleural effusion/pleural nodularity and scattered areas of nodular airspace consolidation, all considered metastatic on CT chest 08/28/2021. No definite intervening acute findings. Electronically Signed   By: Leanna Battles M.D.   On: 09/12/2021 13:15   DG Chest 2 View  Result Date: 08/28/2021 CLINICAL DATA:  Cough and fever.  Metastatic renal carcinoma. EXAM: CHEST - 2 VIEW COMPARISON:  04/04/2021 FINDINGS: Multiple bilateral pulmonary nodules are best seen on prior CT. Lung nodules the right appear mildly  improved from the prior study. Decreased nodularity and infiltrate in the left lower lobe. Large extrapleural mass and rib destruction involving the left fifth rib laterally. This is similar to the prior study. Small left pleural effusion with mild interval improvement. Right paratracheal adenopathy similar to the prior study. IMPRESSION: Negative for acute pneumonia Metastatic disease in the chest. Several lung nodules appear smaller. Improved aeration left lower lobe.  Decreased left effusion.  Electronically Signed   By: Marlan Palau M.D.   On: 08/28/2021 19:08   CT Angio Chest PE W and/or Wo Contrast  Result Date: 08/28/2021 CLINICAL DATA:  Pulmonary embolism (PE) suspected, high prob; Sepsis. Patient with left-sided chest, rib, flank pain. Patient has history of metastatic cancer. He did not know he had a fever until he arrived today. Has cough which is chronic. Has had multiple episodes of emesis. Recently discharged for pancreatitis. Metastatic urologic cancer. EXAM: CT ANGIOGRAPHY CHEST CT ABDOMEN AND PELVIS WITH CONTRAST TECHNIQUE: Multidetector CT imaging of the chest was performed using the standard protocol during bolus administration of intravenous contrast. Multiplanar CT image reconstructions and MIPs were obtained to evaluate the vascular anatomy. Multidetector CT imaging of the abdomen and pelvis was performed using the standard protocol during bolus administration of intravenous contrast. RADIATION DOSE REDUCTION: This exam was performed according to the departmental dose-optimization program which includes automated exposure control, adjustment of the mA and/or kV according to patient size and/or use of iterative reconstruction technique. CONTRAST:  OMNIPAQUE IOHEXOL 350 MG/ML SOLN COMPARISON:  CT abdomen pelvis 07/01/2021, CT chest abdomen pelvis 04/16/2021. FINDINGS: CTA CHEST FINDINGS Cardiovascular: Satisfactory opacification of the pulmonary arteries to the segmental level. No evidence of pulmonary embolism. Normal heart size. No significant pericardial effusion. The thoracic aorta is normal in caliber. No atherosclerotic plaque of the thoracic aorta. No coronary artery calcifications. Mediastinum/Nodes: Interval decrease in size of conglomerative mediastinal lymphadenopathy with as an example a subcarinal 2 x 5.5 cm lesion (from 5.9 x 3.1 cm). Interval decrease in hilar lymphadenopathy with as an example a 1.8 cm (from 2.6 cm) right hilar lymph node (6:38). No axillary  lymph nodes. Thyroid gland, trachea, and esophagus demonstrate no significant findings. Lungs/Pleura: Interval decrease in size of innumerable pulmonary metastases with as an example a 3.3 x 2.6 cm (from 3.7 x 3.6 cm) left lower lobe (12:109) status as well as a left upper lobe 1.8 x 2.1 cm (12:71) right upper lobe (from 2.2 x 2.1 cm). No pulmonary mass. Stable left pleural effusion. Redemonstration of interval decrease in size of associated left pleural nodularity/mass (6:81) now measuring 8.2 x 1.3 cm (from 8.7 x 2.3 cm). No pneumothorax. Musculoskeletal: No chest wall abnormality. No suspicious lytic or blastic osseous lesions. No acute displaced fracture. Interval decrease in size of a left lateral fifth rib soft tissue metastasis now measuring 6.3 x 4.1 cm (from 6.8 x 4.9 cm) with associated persistent resorption/destruction of the lateral fifth rib. Review of the MIP images confirms the above findings. CT ABDOMEN and PELVIS FINDINGS Hepatobiliary: No focal liver abnormality. No gallstones, gallbladder wall thickening, or pericholecystic fluid. No biliary dilatation. Pancreas: No focal lesion. Query slightly hazy contour of the pancreatic head. Otherwise no surrounding inflammatory changes. No main pancreatic ductal dilatation. No pancreatic pseudocyst formation. Spleen: Normal in size without focal abnormality. Adrenals/Urinary Tract: Grossly stable 6 x 2.4 cm (from 6 x 2.6 cm) left adrenal gland metastatic lesion (2:27). The right adrenal gland is normal with no nodularity. Bilateral kidneys enhance symmetrically. Redemonstration of  a grossly stable in size heterogeneous 12 x 10 cm right renal lesion. Redemonstration of several other subcentimeter hypodensities within bilateral kidneys. Interval decrease in size of a heterogeneous 2.1 x 2.1 (from 2.2 x 2.3 cm) left renal lesion (2:37). No hydronephrosis. No hydroureter. The urinary bladder is unremarkable. Stomach/Bowel: Stomach is within normal limits. No  evidence of bowel wall thickening or dilatation. The appendix not definitely identified. Vascular/Lymphatic: The inferior vena cava is patent. The hepatic, portal, splenic, superior mesenteric veins are patent. The right renal vein appears to be patent. The left renal vein appears to be patent. No abdominal aorta or iliac aneurysm. Mild atherosclerotic plaque of the aorta and its branches. No abdominal, pelvic, or inguinal lymphadenopathy. Reproductive: Prominent prostate. Other: No intraperitoneal free fluid. No intraperitoneal free gas. No organized fluid collection. Musculoskeletal: No abdominal wall hernia or abnormality. No suspicious lytic or blastic osseous lesions. No acute displaced fracture. Multilevel degenerative changes of the spine. Bilateral sacroiliac joint degenerative changes. Review of the MIP images confirms the above findings. IMPRESSION: 1. No pulmonary embolus. 2. Query slightly hazy contour of the pancreatic head. Correlate with lipase levels for acute pancreatitis. 3. Grossly stable 12 cm right renal malignancy. 4. Interval decrease in size of multiple known metastases including bilateral pulmonary metastases, left pleural metastases, hilar and mediastinal lymphadenopathy, as well as a left chest wall metastasis involving the left fifth rib. 5. Grossly stable left adrenal gland metastasis. 6. Grossly stable to possibly slightly decreased in size 2.1 cm left renal malignancy. Electronically Signed   By: Tish Frederickson M.D.   On: 08/28/2021 20:53   CT ABDOMEN PELVIS W CONTRAST  Result Date: 08/28/2021 CLINICAL DATA:  Pulmonary embolism (PE) suspected, high prob; Sepsis. Patient with left-sided chest, rib, flank pain. Patient has history of metastatic cancer. He did not know he had a fever until he arrived today. Has cough which is chronic. Has had multiple episodes of emesis. Recently discharged for pancreatitis. Metastatic urologic cancer. EXAM: CT ANGIOGRAPHY CHEST CT ABDOMEN AND PELVIS  WITH CONTRAST TECHNIQUE: Multidetector CT imaging of the chest was performed using the standard protocol during bolus administration of intravenous contrast. Multiplanar CT image reconstructions and MIPs were obtained to evaluate the vascular anatomy. Multidetector CT imaging of the abdomen and pelvis was performed using the standard protocol during bolus administration of intravenous contrast. RADIATION DOSE REDUCTION: This exam was performed according to the departmental dose-optimization program which includes automated exposure control, adjustment of the mA and/or kV according to patient size and/or use of iterative reconstruction technique. CONTRAST:  OMNIPAQUE IOHEXOL 350 MG/ML SOLN COMPARISON:  CT abdomen pelvis 07/01/2021, CT chest abdomen pelvis 04/16/2021. FINDINGS: CTA CHEST FINDINGS Cardiovascular: Satisfactory opacification of the pulmonary arteries to the segmental level. No evidence of pulmonary embolism. Normal heart size. No significant pericardial effusion. The thoracic aorta is normal in caliber. No atherosclerotic plaque of the thoracic aorta. No coronary artery calcifications. Mediastinum/Nodes: Interval decrease in size of conglomerative mediastinal lymphadenopathy with as an example a subcarinal 2 x 5.5 cm lesion (from 5.9 x 3.1 cm). Interval decrease in hilar lymphadenopathy with as an example a 1.8 cm (from 2.6 cm) right hilar lymph node (6:38). No axillary lymph nodes. Thyroid gland, trachea, and esophagus demonstrate no significant findings. Lungs/Pleura: Interval decrease in size of innumerable pulmonary metastases with as an example a 3.3 x 2.6 cm (from 3.7 x 3.6 cm) left lower lobe (12:109) status as well as a left upper lobe 1.8 x 2.1 cm (12:71) right upper lobe (  from 2.2 x 2.1 cm). No pulmonary mass. Stable left pleural effusion. Redemonstration of interval decrease in size of associated left pleural nodularity/mass (6:81) now measuring 8.2 x 1.3 cm (from 8.7 x 2.3 cm). No  pneumothorax. Musculoskeletal: No chest wall abnormality. No suspicious lytic or blastic osseous lesions. No acute displaced fracture. Interval decrease in size of a left lateral fifth rib soft tissue metastasis now measuring 6.3 x 4.1 cm (from 6.8 x 4.9 cm) with associated persistent resorption/destruction of the lateral fifth rib. Review of the MIP images confirms the above findings. CT ABDOMEN and PELVIS FINDINGS Hepatobiliary: No focal liver abnormality. No gallstones, gallbladder wall thickening, or pericholecystic fluid. No biliary dilatation. Pancreas: No focal lesion. Query slightly hazy contour of the pancreatic head. Otherwise no surrounding inflammatory changes. No main pancreatic ductal dilatation. No pancreatic pseudocyst formation. Spleen: Normal in size without focal abnormality. Adrenals/Urinary Tract: Grossly stable 6 x 2.4 cm (from 6 x 2.6 cm) left adrenal gland metastatic lesion (2:27). The right adrenal gland is normal with no nodularity. Bilateral kidneys enhance symmetrically. Redemonstration of a grossly stable in size heterogeneous 12 x 10 cm right renal lesion. Redemonstration of several other subcentimeter hypodensities within bilateral kidneys. Interval decrease in size of a heterogeneous 2.1 x 2.1 (from 2.2 x 2.3 cm) left renal lesion (2:37). No hydronephrosis. No hydroureter. The urinary bladder is unremarkable. Stomach/Bowel: Stomach is within normal limits. No evidence of bowel wall thickening or dilatation. The appendix not definitely identified. Vascular/Lymphatic: The inferior vena cava is patent. The hepatic, portal, splenic, superior mesenteric veins are patent. The right renal vein appears to be patent. The left renal vein appears to be patent. No abdominal aorta or iliac aneurysm. Mild atherosclerotic plaque of the aorta and its branches. No abdominal, pelvic, or inguinal lymphadenopathy. Reproductive: Prominent prostate. Other: No intraperitoneal free fluid. No intraperitoneal  free gas. No organized fluid collection. Musculoskeletal: No abdominal wall hernia or abnormality. No suspicious lytic or blastic osseous lesions. No acute displaced fracture. Multilevel degenerative changes of the spine. Bilateral sacroiliac joint degenerative changes. Review of the MIP images confirms the above findings. IMPRESSION: 1. No pulmonary embolus. 2. Query slightly hazy contour of the pancreatic head. Correlate with lipase levels for acute pancreatitis. 3. Grossly stable 12 cm right renal malignancy. 4. Interval decrease in size of multiple known metastases including bilateral pulmonary metastases, left pleural metastases, hilar and mediastinal lymphadenopathy, as well as a left chest wall metastasis involving the left fifth rib. 5. Grossly stable left adrenal gland metastasis. 6. Grossly stable to possibly slightly decreased in size 2.1 cm left renal malignancy. Electronically Signed   By: Tish Frederickson M.D.   On: 08/28/2021 20:53   DG CHEST PORT 1 VIEW  Result Date: 09/14/2021 CLINICAL DATA:  Cough, shortness of breath. EXAM: PORTABLE CHEST 1 VIEW COMPARISON:  September 12, 2021.  August 28, 2021. FINDINGS: Stable right paratracheal adenopathy is noted. Normal cardiac size. Stable left large pleural based mass with destruction of rib consistent with metastatic disease as noted on prior CT. Right lung is clear. Minimal left pleural effusion or pleural thickening is noted. IMPRESSION: Grossly stable findings compared to prior exam. Electronically Signed   By: Lupita Raider M.D.   On: 09/14/2021 10:16   US Abdomen Limited RUQ (LIVER/GB)  Result Date: 08/29/2021 CLINICAL DATA:  Pancreatitis EXAM: ULTRASOUND ABDOMEN LIMITED RIGHT UPPER QUADRANT COMPARISON:  CT 08/28/2021 FINDINGS: Gallbladder: No gallstones or wall thickening visualized. No sonographic Murphy sign noted by sonographer. Common bile duct: Diameter:  Obscured by bowel gas. No gross intrahepatic duct dilatation identified. No  choledocholithiasis seen on recent CT. Liver: No focal lesion identified. Within normal limits in parenchymal echogenicity. Portal vein is patent on color Doppler imaging with normal direction of blood flow towards the liver. Other: None. IMPRESSION: No cholelithiasis. Obscured common duct, without biliary duct dilatation or choledocholithiasis on recent CT. Electronically Signed   By: Jeronimo Greaves M.D.   On: 08/29/2021 11:59    Microbiology: Results for orders placed or performed during the hospital encounter of 09/12/21  Blood Culture (routine x 2)     Status: None (Preliminary result)   Collection Time: 09/12/21  1:33 PM   Specimen: BLOOD  Result Value Ref Range Status   Specimen Description   Final    BLOOD RIGHT ANTECUBITAL Performed at [facility], [address], [place], [place] [address]    Special Requests   Final    BOTTLES DRAWN AEROBIC AND ANAEROBIC Blood Culture results may not be optimal due to an excessive volume of blood received in culture bottles Performed at [facility], [address] [place], [place] [address]    Culture   Final    NO GROWTH 4 DAYS Performed at [facility], [address] [place], [place] [address]    Report Status PENDING  Incomplete  Blood Culture (routine x 2)     Status: None (Preliminary result)   Collection Time: 09/12/21  1:43 PM   Specimen: BLOOD  Result Value Ref Range Status   Specimen Description   Final    BLOOD LEFT ANTECUBITAL Performed at [facility], [address], [place], [place] [address]    Special Requests   Final    BOTTLES DRAWN AEROBIC AND ANAEROBIC Blood Culture results may not be optimal due to an excessive volume of blood received in culture bottles Performed at [facility], [address], [place], [place] [address]    Culture   Final    NO GROWTH 4 DAYS Performed at [facility], [address] [place], [place]  [address]    Report Status PENDING  Incomplete    Labs: CBC: Recent Labs  Lab 09/12/21 1243 09/13/21 0431 09/14/21 0440 09/15/21 0544 09/16/21 0510  WBC 4.1 3.0* 4.9 5.1 4.9  NEUTROABS 2.3  --   --   --   --   HGB 11.7* 10.6* 10.5* 10.8* 10.6*  HCT 37.4* 32.9* 33.7* 34.9* 33.9*  MCV 80.3 79.9* 82.0 82.3 81.3  PLT 313 351 278 305 278   Basic Metabolic Panel: Recent Labs  Lab 09/13/21 0431 09/13/21 1312 09/14/21 0440 09/15/21 0544 09/16/21 0510  NA 134* 137 136 136 137  K 4.4 4.5 3.4* 3.8 3.4*  CL 107 110 110 110 110  CO2 21* 17* 20* 20* 21*  GLUCOSE 128* 139* 105* 97 111*  BUN 10 10 6  <5* <5*  CREATININE 0.43* 0.82 0.42* 0.51* 0.45*  CALCIUM 12.8* 12.4* 10.7* 10.6* 10.4*  MG  --   --  1.6* 1.5* 1.8   Liver Function Tests: Recent Labs  Lab 09/12/21 1243 09/13/21 0431  AST 17 14*  ALT 14 14  ALKPHOS 54 46  BILITOT 0.8 0.5  PROT 8.1 7.8  ALBUMIN 3.8 3.6   CBG: No results for input(s): GLUCAP in the last 168 hours.  Discharge time spent: greater than 30 minutes.  Signed: Lynden Oxford, MD [facility] 09/16/2021

## 2021-09-16 NOTE — Progress Notes (Signed)
Patient has been provided his AVS, all questions regarding medication and appointments have been answered to patient satisfaction. There are no questions from the patient. IV's have been discontinued with no complications.  ?

## 2021-09-17 ENCOUNTER — Other Ambulatory Visit (HOSPITAL_COMMUNITY): Payer: Self-pay

## 2021-09-17 ENCOUNTER — Telehealth: Payer: Self-pay | Admitting: Oncology

## 2021-09-17 LAB — CULTURE, BLOOD (ROUTINE X 2)
Culture: NO GROWTH
Culture: NO GROWTH

## 2021-09-17 NOTE — Telephone Encounter (Signed)
Sch per 3/10 inbasket, unable to leave msg. Calendar mailed ?

## 2021-09-28 ENCOUNTER — Inpatient Hospital Stay (HOSPITAL_BASED_OUTPATIENT_CLINIC_OR_DEPARTMENT_OTHER): Payer: Medicaid Other | Admitting: Oncology

## 2021-09-28 ENCOUNTER — Other Ambulatory Visit: Payer: Self-pay

## 2021-09-28 ENCOUNTER — Inpatient Hospital Stay: Payer: Medicaid Other | Attending: Oncology

## 2021-09-28 VITALS — BP 117/76 | HR 63 | Temp 97.9°F | Resp 15 | Ht 69.0 in | Wt 153.2 lb

## 2021-09-28 DIAGNOSIS — J90 Pleural effusion, not elsewhere classified: Secondary | ICD-10-CM

## 2021-09-28 DIAGNOSIS — C641 Malignant neoplasm of right kidney, except renal pelvis: Secondary | ICD-10-CM | POA: Diagnosis present

## 2021-09-28 DIAGNOSIS — C7800 Secondary malignant neoplasm of unspecified lung: Secondary | ICD-10-CM | POA: Diagnosis not present

## 2021-09-28 DIAGNOSIS — R918 Other nonspecific abnormal finding of lung field: Secondary | ICD-10-CM

## 2021-09-28 DIAGNOSIS — Z923 Personal history of irradiation: Secondary | ICD-10-CM | POA: Diagnosis not present

## 2021-09-28 DIAGNOSIS — C7931 Secondary malignant neoplasm of brain: Secondary | ICD-10-CM | POA: Diagnosis not present

## 2021-09-28 DIAGNOSIS — F17210 Nicotine dependence, cigarettes, uncomplicated: Secondary | ICD-10-CM | POA: Insufficient documentation

## 2021-09-28 DIAGNOSIS — N2889 Other specified disorders of kidney and ureter: Secondary | ICD-10-CM

## 2021-09-28 LAB — CMP (CANCER CENTER ONLY)
ALT: 14 U/L (ref 0–44)
AST: 16 U/L (ref 15–41)
Albumin: 3.6 g/dL (ref 3.5–5.0)
Alkaline Phosphatase: 74 U/L (ref 38–126)
Anion gap: 6 (ref 5–15)
BUN: 11 mg/dL (ref 6–20)
CO2: 27 mmol/L (ref 22–32)
Calcium: 9.7 mg/dL (ref 8.9–10.3)
Chloride: 102 mmol/L (ref 98–111)
Creatinine: 0.78 mg/dL (ref 0.61–1.24)
GFR, Estimated: >60 mL/min (ref >60)
Glucose, Bld: 89 mg/dL (ref 70–99)
Potassium: 3.8 mmol/L (ref 3.5–5.1)
Sodium: 135 mmol/L (ref 135–145)
Total Bilirubin: 0.3 mg/dL (ref 0.3–1.2)
Total Protein: 7.1 g/dL (ref 6.5–8.1)

## 2021-09-28 LAB — CBC WITH DIFFERENTIAL (CANCER CENTER ONLY)
Abs Immature Granulocytes: 0.01 10*3/uL (ref 0.00–0.07)
Basophils Absolute: 0 10*3/uL (ref 0.0–0.1)
Basophils Relative: 1 %
Eosinophils Absolute: 0.2 10*3/uL (ref 0.0–0.5)
Eosinophils Relative: 4 %
HCT: 39 % (ref 39.0–52.0)
Hemoglobin: 12.6 g/dL — ABNORMAL LOW (ref 13.0–17.0)
Immature Granulocytes: 0 %
Lymphocytes Relative: 32 %
Lymphs Abs: 1.3 10*3/uL (ref 0.7–4.0)
MCH: 25.2 pg — ABNORMAL LOW (ref 26.0–34.0)
MCHC: 32.3 g/dL (ref 30.0–36.0)
MCV: 78 fL — ABNORMAL LOW (ref 80.0–100.0)
Monocytes Absolute: 0.4 10*3/uL (ref 0.1–1.0)
Monocytes Relative: 9 %
Neutro Abs: 2.3 10*3/uL (ref 1.7–7.7)
Neutrophils Relative %: 54 %
Platelet Count: 278 10*3/uL (ref 150–400)
RBC: 5 MIL/uL (ref 4.22–5.81)
RDW: 14.4 % (ref 11.5–15.5)
WBC Count: 4.2 10*3/uL (ref 4.0–10.5)
nRBC: 0 % (ref 0.0–0.2)

## 2021-09-28 NOTE — Progress Notes (Signed)
Hematology and Oncology Follow Up Visit ? ?Dylan Good ?[id number] ?06/25/1971 51 y.o. ?09/28/2021 12:31 PM ?System, Provider Not Jarold Song, MD  ? 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 ? ? ?Prior Therapy:  ? ?He is status post thoracentesis completed in June 2022 and repeated on January 02, 2021. ? ?He is s/p Pleurx catheter insertion in August 2022. ? ?He is status post stereotactic radiosurgery to the brain completed on January 29, 2021.  He received 20 Gray in 1 fraction. ? ?He is status post kidney biopsy completed on March 06, 2021.  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 ? ?Ipilimumab 1 mg/kg with nivolumab 3 mg/kg started on January 15, 2021.  He completed 4 cycles of therapy. ? ?Nivolumab 480 mg every 4 weeks to start on April 10, 2021.  Therapy discontinued due to progression of disease. ? ?Current therapy: Cabometyx 40 mg started on May 11, 2021.  Therapy interrupted during frequent hospitalization between December and March 2023. ? ?Interim History: Dylan Good is here for repeat evaluation.  Since last visit, he reports feeling well without any major complaints.  He was hospitalized in March 2023 for hypercalcemia and dyspnea after COVID-19 infection.  Since his discharge, he reports his respiratory symptoms is improved and he is eating better.  He denies any nausea, vomiting or abdominal pain.  He received Cabometyx without any major complications at this time.  He denies any worsening diarrhea or anorexia. ? ? ?Medications: Reviewed without changes. ? ? ? ? ?Allergies: No Known Allergies ? ? ? ?Physical Exam: ? ?Blood pressure 117/76, pulse 63, temperature 97.9 ?F (36.6 ?C), temperature source Temporal, resp. rate 15, height 5\' 9"  (1.753 m), weight 153 lb 3.2 oz (69.5 kg), SpO2 98 %. ? ? ? ? ?ECOG: 1 ? ? ?General appearance: Comfortable appearing without any discomfort ?Head:  Normocephalic without any trauma ?Oropharynx: Mucous membranes are moist and pink without any thrush or ulcers. ?Eyes: Pupils are equal and round reactive to light. ?Lymph nodes: No cervical, supraclavicular, inguinal or axillary lymphadenopathy.   ?Heart:regular rate and rhythm.  S1 and S2 without leg edema. ?Lung: Clear without any rhonchi or wheezes.  No dullness to percussion. ?Abdomin: Soft, nontender, nondistended with good bowel sounds.  No hepatosplenomegaly. ?Musculoskeletal: No joint deformity or effusion.  Full range of motion noted. ?Neurological: No deficits noted on motor, sensory and deep tendon reflex exam. ?Skin: No petechial rash or dryness.  Appeared moist.  ? ? ? ? ? ? ?Lab Results: ?Lab Results  ?Component Value Date  ? WBC 4.9 09/16/2021  ? HGB 10.6 (L) 09/16/2021  ? HCT 33.9 (L) 09/16/2021  ? MCV 81.3 09/16/2021  ? PLT 278 09/16/2021  ? ?  Chemistry   ?   ?Component Value Date/Time  ? NA 137 09/16/2021 0510  ? K 3.4 (L) 09/16/2021 0510  ? CL 110 09/16/2021 0510  ? CO2 21 (L) 09/16/2021 0510  ? BUN <5 (L) 09/16/2021 0510  ? CREATININE 0.45 (L) 09/16/2021 0510  ? CREATININE 0.65 07/19/2021 1516  ?    ?Component Value Date/Time  ? CALCIUM 10.4 (H) 09/16/2021 0510  ? ALKPHOS 46 09/13/2021 0431  ? AST 14 (L) 09/13/2021 0431  ? AST 12 (L) 07/19/2021 1516  ? ALT 14 09/13/2021 0431  ? ALT 12 07/19/2021 1516  ? BILITOT 0.5 09/13/2021 0431  ? BILITOT 1.0 07/19/2021 1516  ?  ? ?IMPRESSION: ?1.  No pulmonary embolus. ?2. Query slightly hazy contour of the pancreatic head. Correlate ?with lipase levels for acute pancreatitis. ?3. Grossly stable 12 cm right renal malignancy. ?4. Interval decrease in size of multiple known metastases including ?bilateral pulmonary metastases, left pleural metastases, hilar and ?mediastinal lymphadenopathy, as well as a left chest wall metastasis ?involving the left fifth rib. ?5. Grossly stable left adrenal gland metastasis. ?6. Grossly stable to possibly slightly decreased  in size 2.1 cm left ?renal malignancy. ?  ? ?Impression and Plan: ? ? ?51 year old man with: ? ?1.  Stage IV clear-cell renal cell carcinoma with adrenal and pulmonary involvement diagnosed in 2022. ? ?He continues to be on Cabometyx although not taking regularly given his frequent hospitalizations.  CT scan obtained on August 28, 2021 was personally reviewed and showed overall response to treatment.  I have recommended that continue Cabometyx at this time with the same dose and schedule.  Alternative treatment options including Inlyta or other oral targeted therapy.  He is agreeable to continue at this time. ? ?  ?2.  Respiratory failure and pulmonary symptoms: Related to COVID-19 infection and recent hospitalizations. ?  ?3.  Hypercalcemia: He is status post recent hospitalization and Zometa infusion.  We will continue to monitor and repeat infusion regularly.  His calcium level has normalized today and will repeat in 4 weeks. ? ? ?4.  CNS metastasis: Last MRI of the brain done in February 2023 which showed no evidence of relapsed disease. ? ?5.  Pancreatitis: Resolved at this time. ? ?6.  Goals of care and prognosis: His disease is incurable although aggressive measures are warranted at this time given his reasonable performance status and young age. ? ? 7.  Follow-up: In 4 weeks for repeat follow-up. ?  ?30  minutes were spent on this visit.  The time was dedicated to reviewing laboratory data, disease status update and outlining future plan of care review. ? ? ?Zola Button, MD ?3/24/202312:31 PM ? ?

## 2021-10-04 ENCOUNTER — Emergency Department (HOSPITAL_COMMUNITY): Payer: Medicaid Other

## 2021-10-04 ENCOUNTER — Encounter (HOSPITAL_COMMUNITY): Payer: Self-pay | Admitting: Emergency Medicine

## 2021-10-04 ENCOUNTER — Inpatient Hospital Stay (HOSPITAL_COMMUNITY)
Admission: EM | Admit: 2021-10-04 | Discharge: 2021-10-07 | DRG: 439 | Disposition: A | Discharge status: Home / Self Care | Payer: Medicaid Other | Attending: Internal Medicine | Admitting: Internal Medicine

## 2021-10-04 DIAGNOSIS — K859 Acute pancreatitis without necrosis or infection, unspecified: Secondary | ICD-10-CM | POA: Diagnosis not present

## 2021-10-04 DIAGNOSIS — C641 Malignant neoplasm of right kidney, except renal pelvis: Secondary | ICD-10-CM | POA: Diagnosis not present

## 2021-10-04 DIAGNOSIS — Z79899 Other long term (current) drug therapy: Secondary | ICD-10-CM

## 2021-10-04 DIAGNOSIS — Z8616 Personal history of COVID-19: Secondary | ICD-10-CM

## 2021-10-04 DIAGNOSIS — C7971 Secondary malignant neoplasm of right adrenal gland: Secondary | ICD-10-CM | POA: Diagnosis present

## 2021-10-04 DIAGNOSIS — D72818 Other decreased white blood cell count: Secondary | ICD-10-CM | POA: Diagnosis present

## 2021-10-04 DIAGNOSIS — N200 Calculus of kidney: Secondary | ICD-10-CM | POA: Diagnosis not present

## 2021-10-04 DIAGNOSIS — E871 Hypo-osmolality and hyponatremia: Secondary | ICD-10-CM | POA: Diagnosis present

## 2021-10-04 DIAGNOSIS — N132 Hydronephrosis with renal and ureteral calculous obstruction: Secondary | ICD-10-CM | POA: Diagnosis present

## 2021-10-04 DIAGNOSIS — C7972 Secondary malignant neoplasm of left adrenal gland: Secondary | ICD-10-CM | POA: Diagnosis present

## 2021-10-04 DIAGNOSIS — T451X5A Adverse effect of antineoplastic and immunosuppressive drugs, initial encounter: Secondary | ICD-10-CM | POA: Diagnosis present

## 2021-10-04 DIAGNOSIS — J90 Pleural effusion, not elsewhere classified: Secondary | ICD-10-CM | POA: Diagnosis present

## 2021-10-04 DIAGNOSIS — C7801 Secondary malignant neoplasm of right lung: Secondary | ICD-10-CM | POA: Diagnosis present

## 2021-10-04 DIAGNOSIS — D630 Anemia in neoplastic disease: Secondary | ICD-10-CM | POA: Diagnosis present

## 2021-10-04 DIAGNOSIS — R062 Wheezing: Secondary | ICD-10-CM

## 2021-10-04 DIAGNOSIS — C649 Malignant neoplasm of unspecified kidney, except renal pelvis: Secondary | ICD-10-CM | POA: Diagnosis present

## 2021-10-04 DIAGNOSIS — F17210 Nicotine dependence, cigarettes, uncomplicated: Secondary | ICD-10-CM | POA: Diagnosis present

## 2021-10-04 DIAGNOSIS — C642 Malignant neoplasm of left kidney, except renal pelvis: Secondary | ICD-10-CM | POA: Diagnosis present

## 2021-10-04 DIAGNOSIS — I10 Essential (primary) hypertension: Secondary | ICD-10-CM | POA: Diagnosis present

## 2021-10-04 DIAGNOSIS — C7802 Secondary malignant neoplasm of left lung: Secondary | ICD-10-CM | POA: Diagnosis present

## 2021-10-04 DIAGNOSIS — K853 Drug induced acute pancreatitis without necrosis or infection: Principal | ICD-10-CM | POA: Diagnosis present

## 2021-10-04 LAB — URINALYSIS, ROUTINE W REFLEX MICROSCOPIC
Bilirubin Urine: NEGATIVE
Glucose, UA: NEGATIVE mg/dL
Ketones, ur: NEGATIVE mg/dL
Nitrite: NEGATIVE
Protein, ur: NEGATIVE mg/dL
RBC / HPF: >50 RBC/hpf — ABNORMAL HIGH (ref 0–5)
Specific Gravity, Urine: 1.019 (ref 1.005–1.030)
pH: 5 (ref 5.0–8.0)

## 2021-10-04 LAB — COMPREHENSIVE METABOLIC PANEL
ALT: 21 U/L (ref 0–44)
AST: 23 U/L (ref 15–41)
Albumin: 3.4 g/dL — ABNORMAL LOW (ref 3.5–5.0)
Alkaline Phosphatase: 74 U/L (ref 38–126)
Anion gap: 7 (ref 5–15)
BUN: 14 mg/dL (ref 6–20)
CO2: 23 mmol/L (ref 22–32)
Calcium: 8.9 mg/dL (ref 8.9–10.3)
Chloride: 104 mmol/L (ref 98–111)
Creatinine, Ser: 0.75 mg/dL (ref 0.61–1.24)
GFR, Estimated: >60 mL/min (ref >60)
Glucose, Bld: 81 mg/dL (ref 70–99)
Potassium: 4.4 mmol/L (ref 3.5–5.1)
Sodium: 134 mmol/L — ABNORMAL LOW (ref 135–145)
Total Bilirubin: 0.2 mg/dL — ABNORMAL LOW (ref 0.3–1.2)
Total Protein: 7.2 g/dL (ref 6.5–8.1)

## 2021-10-04 LAB — CBC
HCT: 43.2 % (ref 39.0–52.0)
Hemoglobin: 13.7 g/dL (ref 13.0–17.0)
MCH: 24.9 pg — ABNORMAL LOW (ref 26.0–34.0)
MCHC: 31.7 g/dL (ref 30.0–36.0)
MCV: 78.5 fL — ABNORMAL LOW (ref 80.0–100.0)
Platelets: 347 10*3/uL (ref 150–400)
RBC: 5.5 MIL/uL (ref 4.22–5.81)
RDW: 14.3 % (ref 11.5–15.5)
WBC: 4 10*3/uL (ref 4.0–10.5)
nRBC: 0 % (ref 0.0–0.2)

## 2021-10-04 LAB — LIPASE, BLOOD: Lipase: 472 U/L — ABNORMAL HIGH (ref 11–51)

## 2021-10-04 IMAGING — CT CT ABD-PELV W/ CM
2 of 5 series · 15 of 46 positions shown, 17 images · IV contrast (agent unspecified)
Comparison: [DATE].

CLINICAL DATA: Epigastric pain.

EXAM:
CT ABDOMEN AND PELVIS WITH CONTRAST
TECHNIQUE: Multidetector CT imaging of the abdomen and pelvis was performed
using the standard protocol following bolus administration of
intravenous contrast.

[Series 2: axial st · axial · 0.98mm/px · z∈[+1213,+1653]mm · 12 of 102 slices shown, 14 images]
[im 7/102  soft-tissue]
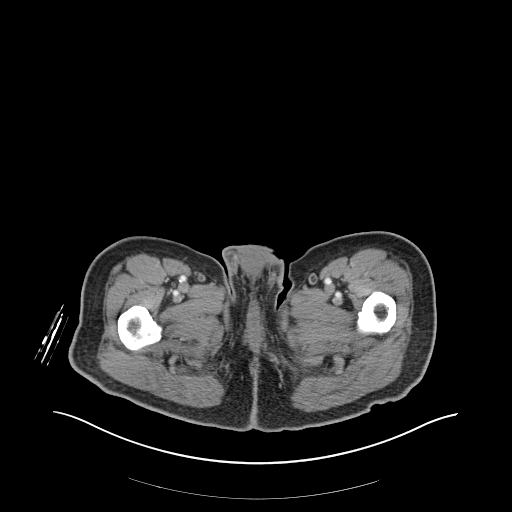
[im 7/102  bone]
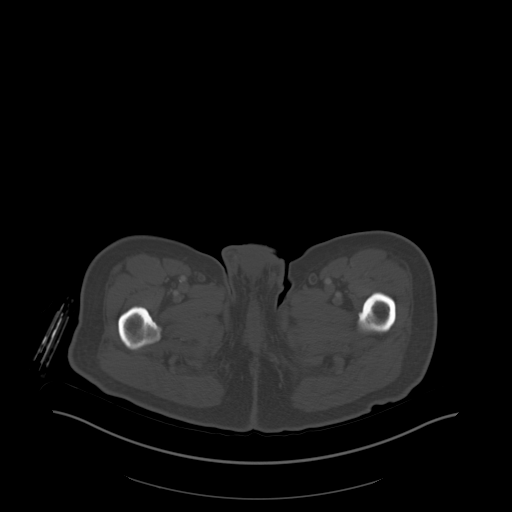
[im 14/102  soft-tissue]
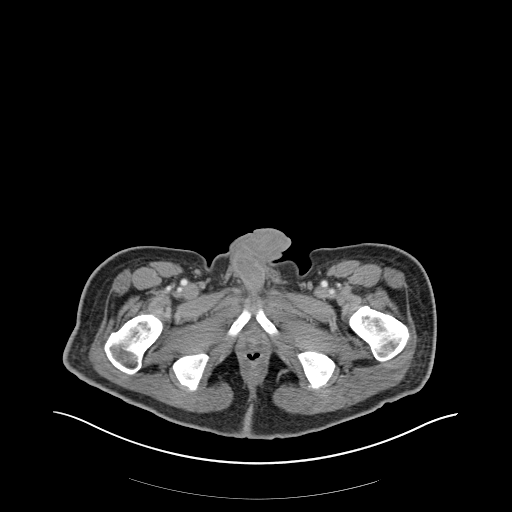
[im 21/102  soft-tissue]
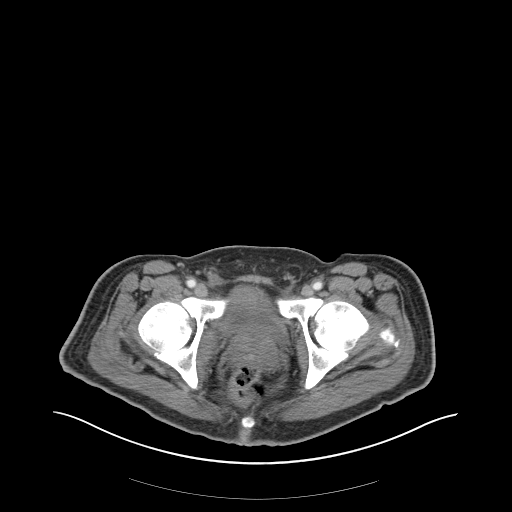
[im 34/102  soft-tissue]
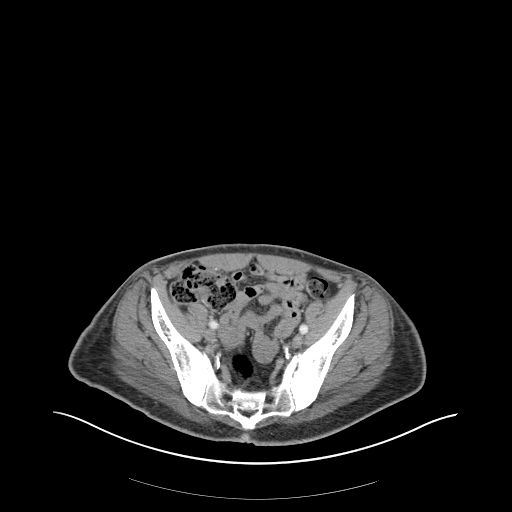
[im 41/102  soft-tissue]
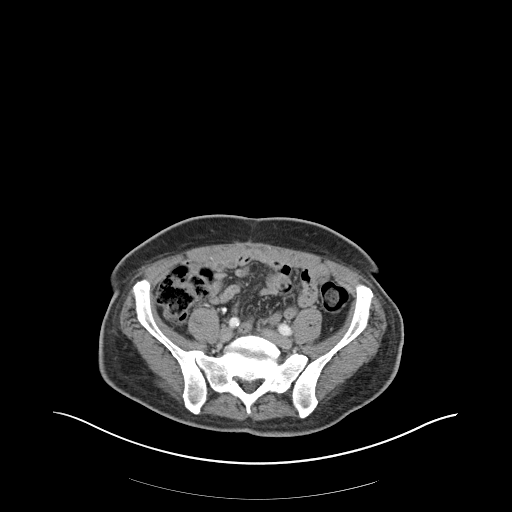
[im 48/102  soft-tissue]
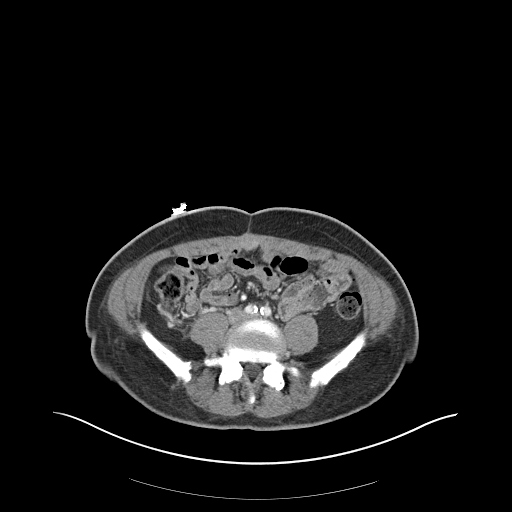
[im 54/102  soft-tissue]
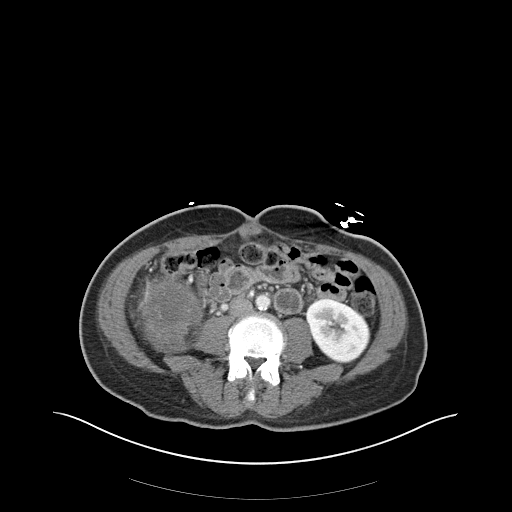
[im 61/102  soft-tissue]
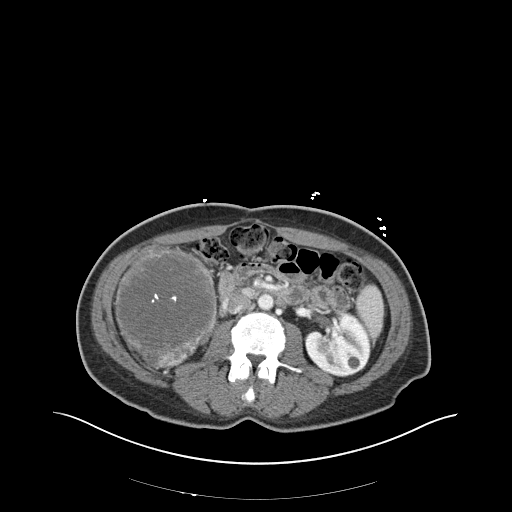
[im 68/102  soft-tissue]
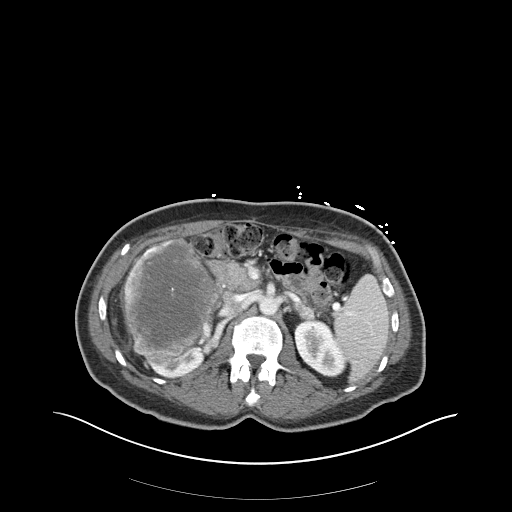
[im 68/102  bone]
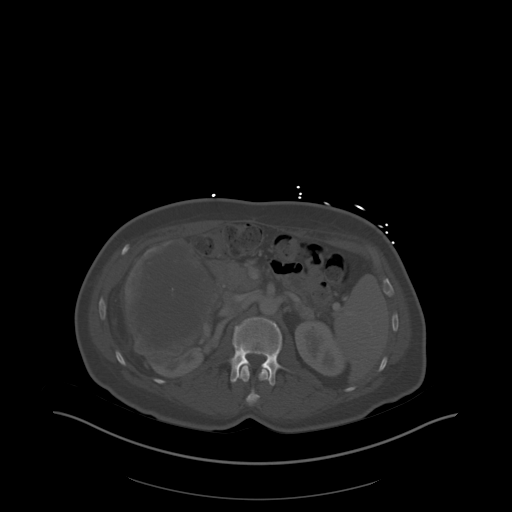
[im 81/102  soft-tissue]
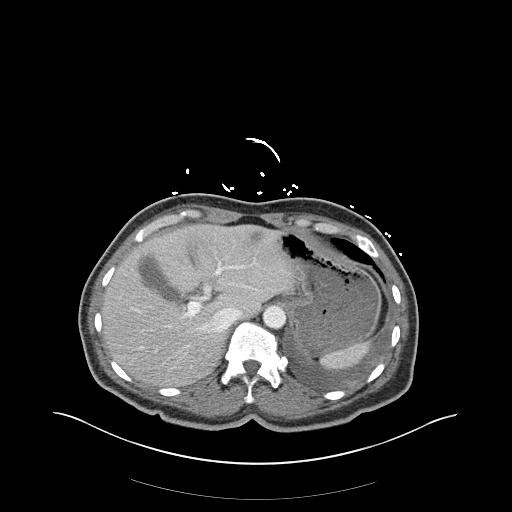
[im 88/102  soft-tissue]
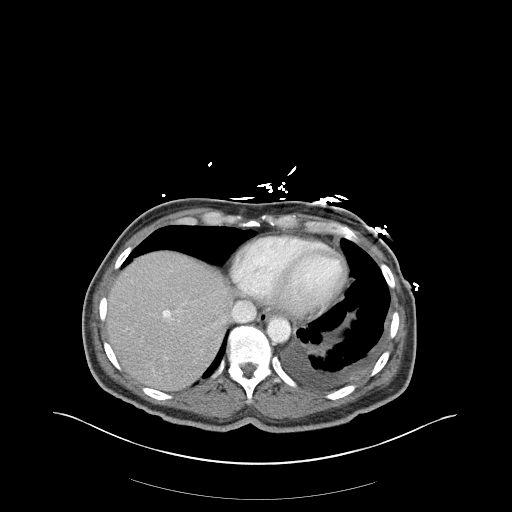
[im 95/102  soft-tissue]
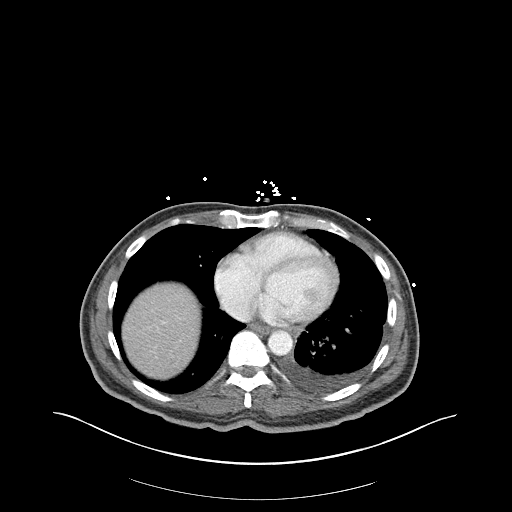

[Series 4: coronal st · coronal · 0.86mm/px · 3 of 129 slices shown]
[im 43/129  soft-tissue]
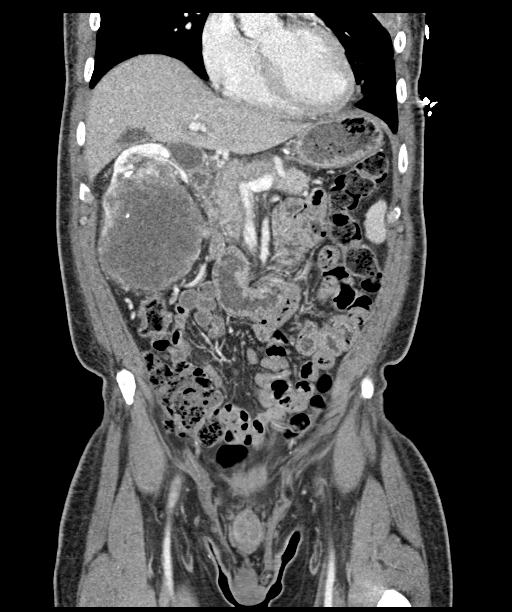
[im 57/129  soft-tissue]
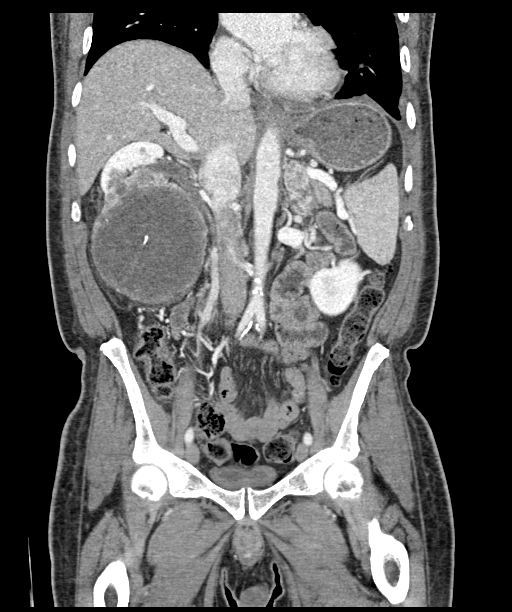
[im 72/129  soft-tissue]
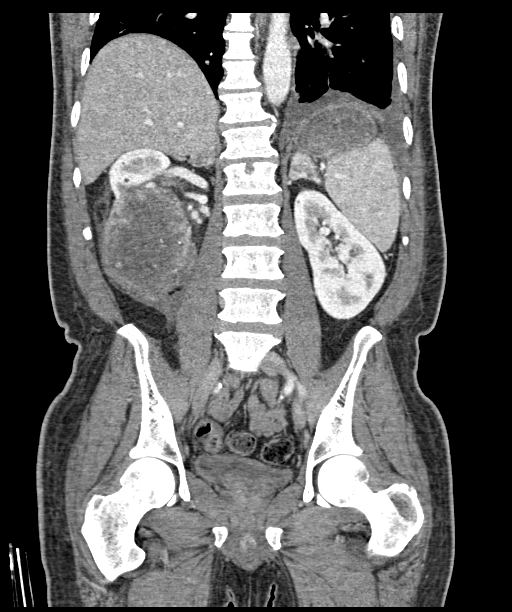

[15 of 46 positions shown; findings below may reference images not displayed]

RADIATION DOSE REDUCTION: This exam was performed according to the
departmental dose-optimization program which includes automated
exposure control, adjustment of the mA and/or kV according to
patient size and/or use of iterative reconstruction technique.

CONTRAST:  100mL OMNIPAQUE IOHEXOL 300 MG/ML  SOLN
FINDINGS: Lower chest: There is a small left pleural effusion with multifocal
nodular lesions along the pleura at the left chest measuring up to
1.9 cm. Scattered pulmonary nodules are noted bilaterally, the
largest in the left lower lobe measuring 1 cm, axial image 21. Mild
atelectasis is present at the lung bases.

Hepatobiliary: No focal liver abnormality is seen. No gallstones,
gallbladder wall thickening, or biliary dilatation.

Pancreas: No biliary ductal dilatation is identified. There is mild
haziness about the pancreatic head, similar in appearance to the
prior exam.

Spleen: Normal in size without focal abnormality.

Adrenals/Urinary Tract: Stable nodular metastasis to the adrenal
glands is noted bilaterally, measuring up to 5.9 cm on the left.
There is redemonstration of a heterogeneous low-attenuation mass
with calcifications in the right kidney measuring 12.1 x 9.5 cm, not
significantly changed from the prior exam. A complex lesion is
present in the left kidney measuring 1.6 cm. Additional scattered
hypodensities are noted in the kidneys bilaterally which are
unchanged. There is mild hydronephrosis on the right with a 6 mm
calculus in the proximal right ureter, coronal image 55. Diffuse
bladder wall thickening is noted.

Stomach/Bowel: The stomach is within normal limits. No bowel
obstruction, free air, or pneumatosis. The appendix is not
visualized on exam. No focal bowel wall thickening.

Vascular/Lymphatic: Aortic atherosclerosis. No enlarged abdominal or
pelvic lymph nodes.

Reproductive: The prostate gland is enlarged.

Other: No ascites.

Musculoskeletal: There is a low-attenuation mass in the chest wall
on the left measuring 4.0 x 2.0 cm, axial image 1 and only partly
visualized on exam, likely resulting in erosion of the adjacent rib.
There is sclerosis at the sacroiliac joints bilaterally, compatible
with sacroiliitis. Degenerative changes are present in the
thoracolumbar spine.
IMPRESSION: 1. Mild haziness at the pancreatic head which is similar in
appearance to the prior exam. Correlation with amylase and lipase is
suggested to exclude pancreatitis.
2. Essentially stable right renal mass, compatible with known
malignancy. Mild hydronephrosis on the right with a 6 mm calculus in
the proximal right ureter, best seen on coronal image 55.
3. Complex left renal lesion measures 1.6 cm, slightly smaller
compared with the prior exam.
4. Stable metastatic disease including bilateral pulmonary nodules,
left pleural effusion containing enhancing nodules, left chest wall
mass, and adrenal glands.
5. Aortic atherosclerosis.

## 2021-10-04 MED ORDER — ALBUTEROL SULFATE HFA 108 (90 BASE) MCG/ACT IN AERS: 2.0000 | INHALATION_SPRAY | RESPIRATORY_TRACT | Status: DC | PRN

## 2021-10-04 MED ORDER — MAGNESIUM OXIDE -MG SUPPLEMENT 400 (240 MG) MG PO TABS
400.0000 mg | ORAL_TABLET | Freq: Every day | ORAL | Status: DC
  Administered 2021-10-05 – 2021-10-07 (×3): 400 mg via ORAL
  Filled 2021-10-04 (×3): qty 1

## 2021-10-04 MED ORDER — ACETAMINOPHEN 650 MG RE SUPP
650.0000 mg | Freq: Four times a day (QID) | RECTAL | Status: DC | PRN
Start: 2021-10-04 — End: 2021-10-07

## 2021-10-04 MED ORDER — DM-GUAIFENESIN ER 30-600 MG PO TB12: 1.0000 | ORAL_TABLET | Freq: Two times a day (BID) | ORAL | Status: DC | PRN

## 2021-10-04 MED ORDER — ONDANSETRON HCL 4 MG PO TABS: 4.0000 mg | ORAL_TABLET | Freq: Four times a day (QID) | ORAL | Status: DC | PRN

## 2021-10-04 MED ORDER — ACETAMINOPHEN 325 MG PO TABS
650.0000 mg | ORAL_TABLET | Freq: Four times a day (QID) | ORAL | Status: DC | PRN
  Administered 2021-10-05: 650 mg via ORAL
  Filled 2021-10-04: qty 2

## 2021-10-04 MED ORDER — FLUTICASONE FUROATE-VILANTEROL 200-25 MCG/ACT IN AEPB
1.0000 | INHALATION_SPRAY | Freq: Every day | RESPIRATORY_TRACT | Status: DC
  Administered 2021-10-06 – 2021-10-07 (×2): 1 via RESPIRATORY_TRACT
  Filled 2021-10-04: qty 28

## 2021-10-04 MED ORDER — ONDANSETRON HCL 4 MG/2ML IJ SOLN: 4.0000 mg | Freq: Four times a day (QID) | INTRAMUSCULAR | Status: DC | PRN

## 2021-10-04 MED ORDER — TAMSULOSIN HCL 0.4 MG PO CAPS
0.4000 mg | ORAL_CAPSULE | Freq: Every day | ORAL | Status: DC
  Administered 2021-10-05 – 2021-10-07 (×3): 0.4 mg via ORAL
  Filled 2021-10-04 (×3): qty 1

## 2021-10-04 MED ORDER — MORPHINE SULFATE (PF) 4 MG/ML IV SOLN
4.0000 mg | Freq: Once | INTRAVENOUS | Status: AC
  Administered 2021-10-04: 4 mg via INTRAVENOUS
  Filled 2021-10-04: qty 1

## 2021-10-04 MED ORDER — ENOXAPARIN SODIUM 40 MG/0.4ML IJ SOSY
40.0000 mg | PREFILLED_SYRINGE | INTRAMUSCULAR | Status: DC
  Administered 2021-10-05 – 2021-10-07 (×3): 40 mg via SUBCUTANEOUS
  Filled 2021-10-04 (×3): qty 0.4

## 2021-10-04 MED ORDER — AMLODIPINE BESYLATE 5 MG PO TABS
5.0000 mg | ORAL_TABLET | Freq: Every day | ORAL | Status: DC
  Administered 2021-10-05 – 2021-10-07 (×3): 5 mg via ORAL
  Filled 2021-10-04 (×3): qty 1

## 2021-10-04 MED ORDER — IOHEXOL 300 MG/ML  SOLN
100.0000 mL | Freq: Once | INTRAMUSCULAR | Status: AC | PRN
  Administered 2021-10-04: 100 mL via INTRAVENOUS

## 2021-10-04 MED ORDER — ONDANSETRON HCL 4 MG/2ML IJ SOLN
4.0000 mg | Freq: Once | INTRAMUSCULAR | Status: AC
  Administered 2021-10-04: 4 mg via INTRAVENOUS
  Filled 2021-10-04: qty 2

## 2021-10-04 MED ORDER — ALBUTEROL SULFATE (2.5 MG/3ML) 0.083% IN NEBU: 2.5000 mg | INHALATION_SOLUTION | Freq: Three times a day (TID) | RESPIRATORY_TRACT | Status: DC | PRN

## 2021-10-04 MED ORDER — PANTOPRAZOLE SODIUM 40 MG PO TBEC
40.0000 mg | DELAYED_RELEASE_TABLET | Freq: Every day | ORAL | Status: DC
  Administered 2021-10-05 – 2021-10-07 (×3): 40 mg via ORAL
  Filled 2021-10-04 (×3): qty 1

## 2021-10-04 MED ORDER — METOPROLOL SUCCINATE ER 25 MG PO TB24
25.0000 mg | ORAL_TABLET | Freq: Every day | ORAL | Status: DC
  Administered 2021-10-05 – 2021-10-07 (×3): 25 mg via ORAL
  Filled 2021-10-04 (×3): qty 1

## 2021-10-04 MED ORDER — BENZONATATE 100 MG PO CAPS
200.0000 mg | ORAL_CAPSULE | Freq: Three times a day (TID) | ORAL | Status: DC
  Administered 2021-10-04 – 2021-10-07 (×8): 200 mg via ORAL
  Filled 2021-10-04 (×8): qty 2

## 2021-10-04 MED ORDER — POLYETHYLENE GLYCOL 3350 17 G PO PACK: 17.0000 g | PACK | Freq: Every day | ORAL | Status: DC | PRN

## 2021-10-04 NOTE — ED Triage Notes (Addendum)
Per pt, states abdominal pain radiating to back-history of pancreatitis-no N/V/D ?

## 2021-10-04 NOTE — Assessment & Plan Note (Addendum)
His back pain improved, patient with no hematuria or fevers. ?I spoke with Dr Tresa Moore from urology, and plan to continue conservative care and follow up as outpatient.  ? ?

## 2021-10-04 NOTE — ED Provider Notes (Signed)
?[facility] DEPT ?Provider Note ? ? ?CSN: [id number] ?Arrival date & time: 10/04/21  1837 ? ?  ? ?History ? ?Chief Complaint  ?Patient presents with  ? Abdominal Pain  ? ? ?Dylan Good is a 51 y.o. male with pertinent history of primary malignant neoplasm of right kidney with metastasis from kidney to other sites (patient is currently on Cabometyx p.o. daily medication), pancreatitis, hypercalcemia, hypertension.  Presents to the emergency department with a chief complaint of epigastric pain.  Patient reports that pain started 2 days ago and has been constant since then.  Pain radiates through to his back.  Patient rates pain 6/10 on the pain scale.  Denies any worsening or alleviating factors. ? ?Patient denies any fever, chills, nausea, vomiting, abdominal distention, constipation, diarrhea, melena, blood in stool, dysuria, hematuria, urinary urgency, urinary frequency, swelling or tenderness to genitals, genital sores or lesions, lightheadedness, syncope, chest pain, shortness of breath. ? ? ?Abdominal Pain ?Associated symptoms: no chest pain, no chills, no constipation, no diarrhea, no dysuria, no fever, no hematuria, no nausea, no shortness of breath and no vomiting   ? ?  ? ?Home Medications ?Prior to Admission medications   ?Medication Sig Start Date End Date Taking? Authorizing Provider  ?albuterol (ACCUNEB) 0.63 MG/3ML nebulizer solution Take 1 ampule by nebulization 3 (three) times daily as needed for shortness of breath or wheezing. 09/11/21   [provider]  ?amLODipine (NORVASC) 5 MG tablet TAKE 1 TABLET (5 MG TOTAL) BY MOUTH DAILY. 07/17/21   [name], MD  ?benzonatate (TESSALON) 200 MG capsule Take 1 capsule (200 mg total) by mouth 3 (three) times daily. 09/16/21   [name], MD  ?cabozantinib (CABOMETYX) 40 MG tablet Take 1 tablet (40 mg total) by mouth daily. Take on an empty stomach, 1 hour before or 2 hours after meals. 09/14/21   [name],  MD  ?chlorpheniramine-HYDROcodone 10-8 MG/5ML Take 5 mLs by mouth every 12 (twelve) hours. 09/16/21   [name], MD  ?D3-50 1.25 MG (50000 UT) capsule Take 50,000 Units by mouth once a week. Sundays 08/22/21   [provider]  ?dextromethorphan-guaiFENesin (MUCINEX DM) 30-600 MG 12hr tablet Take 1 tablet by mouth 2 (two) times daily. 09/16/21   [name], MD  ?magnesium oxide (MAG-OX) 400 (240 Mg) MG tablet Take 1 tablet (400 mg total) by mouth daily. 09/16/21   [name], MD  ?nicotine (NICODERM CQ - DOSED IN MG/24 HOURS) 14 mg/24hr patch Place 1 patch (14 mg total) onto the skin daily. ?Patient not taking: Reported on 09/12/2021 08/15/21   Eugenie Filler, MD  ?ondansetron (ZOFRAN) 4 MG tablet Take 1 tablet (4 mg total) by mouth every 6 (six) hours as needed for nausea. 09/16/21   [name], MD  ?pantoprazole (PROTONIX) 40 MG tablet Take 1 tablet (40 mg total) by mouth daily. 08/14/21   Eugenie Filler, MD  ?polyethylene glycol (MIRALAX / GLYCOLAX) 17 g packet Take 17 g by mouth daily as needed for mild constipation. 09/16/21   [name], MD  ?VENTOLIN HFA 108 (90 Base) MCG/ACT inhaler Inhale 2 puffs into the lungs every 4 (four) hours as needed for shortness of breath or wheezing. 09/11/21   [provider]  ?   ? ?Allergies    ?Patient has no known allergies.   ? ?Review of Systems   ?Review of Systems  ?Constitutional:  Negative for chills and fever.  ?Eyes:  Negative for visual disturbance.  ?Respiratory:  Negative for shortness of breath.   ?Cardiovascular:  Negative for chest pain.  ?Gastrointestinal:  Positive for abdominal pain. Negative for abdominal distention, anal bleeding, blood in stool, constipation, diarrhea, nausea, rectal pain and vomiting.  ?Genitourinary:  Negative for decreased urine volume, difficulty urinating, dysuria, flank pain, frequency, genital sores, hematuria, penile discharge, penile pain, penile swelling, scrotal swelling, testicular pain and  urgency.  ?Musculoskeletal:  Negative for back pain and neck pain.  ?Skin:  Negative for color change and rash.  ?Neurological:  Negative for dizziness, syncope, light-headedness and headaches.  ?Psychiatric/Behavioral:  Negative for confusion.   ? ?Physical Exam ?Updated Vital Signs ?BP (!) 150/93 (BP Location: Left Arm)   Pulse 77   Temp 98.2 ?F (36.8 ?C) (Oral)   Resp 18   SpO2 99%  ?Physical Exam ?Vitals and nursing note reviewed.  ?Constitutional:   ?   General: He is not in acute distress. ?   Appearance: He is not ill-appearing, toxic-appearing or diaphoretic.  ?HENT:  ?   Head: Normocephalic.  ?Eyes:  ?   General: No scleral icterus.    ?   Right eye: No discharge.     ?   Left eye: No discharge.  ?Cardiovascular:  ?   Rate and Rhythm: Normal rate.  ?   Pulses:     ?     Radial pulses are 2+ on the right side and 2+ on the left side.  ?Pulmonary:  ?   Effort: Pulmonary effort is normal. No tachypnea, bradypnea or respiratory distress.  ?   Breath sounds: Normal breath sounds. No stridor.  ?Abdominal:  ?   General: Abdomen is flat. Bowel sounds are normal. There is no distension. There are no signs of injury.  ?   Palpations: Abdomen is soft. There is no mass or pulsatile mass.  ?   Tenderness: There is abdominal tenderness in the right upper quadrant, epigastric area and left upper quadrant. There is no right CVA tenderness, left CVA tenderness, guarding or rebound.  ?   Hernia: There is no hernia in the umbilical area or ventral area.  ?Skin: ?   General: Skin is warm and dry.  ?Neurological:  ?   General: No focal deficit present.  ?   Mental Status: He is alert.  ?Psychiatric:     ?   Behavior: Behavior is cooperative.  ? ? ?ED Results / Procedures / Treatments   ?Labs ?(all labs ordered are listed, but only abnormal results are displayed) ?Labs Reviewed  ?LIPASE, BLOOD - Abnormal; Notable for the following components:  ?    Result Value  ? Lipase 472 (*)   ? All other components within normal limits   ?COMPREHENSIVE METABOLIC PANEL - Abnormal; Notable for the following components:  ? Sodium 134 (*)   ? Albumin 3.4 (*)   ? Total Bilirubin 0.2 (*)   ? All other components within normal limits  ?CBC - Abnormal; Notable for the following components:  ? MCV 78.5 (*)   ? MCH 24.9 (*)   ? All other components within normal limits  ?URINALYSIS, ROUTINE W REFLEX MICROSCOPIC - Abnormal; Notable for the following components:  ? Hgb urine dipstick MODERATE (*)   ? Leukocytes,Ua SMALL (*)   ? RBC / HPF >50 (*)   ? Bacteria, UA RARE (*)   ? All other components within normal limits  ? ? ?EKG ?None ? ?Radiology ?CT ABDOMEN PELVIS W CONTRAST ? ?  Result Date: 10/04/2021 ?CLINICAL DATA:  Epigastric pain. EXAM: CT ABDOMEN AND PELVIS WITH CONTRAST TECHNIQUE: Multidetector CT imaging of the abdomen and pelvis was performed using the standard protocol following bolus administration of intravenous contrast. RADIATION DOSE REDUCTION: This exam was performed according to the departmental dose-optimization program which includes automated exposure control, adjustment of the mA and/or kV according to patient size and/or use of iterative reconstruction technique. CONTRAST:  118mL OMNIPAQUE IOHEXOL 300 MG/ML  SOLN COMPARISON:  08/28/2021. FINDINGS: Lower chest: There is a small left pleural effusion with multifocal nodular lesions along the pleura at the left chest measuring up to 1.9 cm. Scattered pulmonary nodules are noted bilaterally, the largest in the left lower lobe measuring 1 cm, axial image 21. Mild atelectasis is present at the lung bases. Hepatobiliary: No focal liver abnormality is seen. No gallstones, gallbladder wall thickening, or biliary dilatation. Pancreas: No biliary ductal dilatation is identified. There is mild haziness about the pancreatic head, similar in appearance to the prior exam. Spleen: Normal in size without focal abnormality. Adrenals/Urinary Tract: Stable nodular metastasis to the adrenal glands is noted  bilaterally, measuring up to 5.9 cm on the left. There is redemonstration of a heterogeneous low-attenuation mass with calcifications in the right kidney measuring 12.1 x 9.5 cm, not significantly changed fro

## 2021-10-04 NOTE — Assessment & Plan Note (Addendum)
Had been taken off of carbometyx due to concern that this was causing recurrent acute pancreatitis,  ? ?At the time of discharge his cell count showed wbc 3,1 hgb 11,7 hct 36,3 and plr 278. 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  ?

## 2021-10-04 NOTE — ED Notes (Signed)
Attempted IV x2 without success  

## 2021-10-04 NOTE — H&P (Signed)
?History and Physical  ? ? ?Patient: Dylan Good TZG:[id number] DOB: [birth year] ?DOA: 10/04/2021 ?DOS: the patient was seen and examined on 10/04/2021 ?PCP: Pcp, No  ?Patient coming from: Home ? ?Chief Complaint:  ?Chief Complaint  ?Patient presents with  ? Abdominal Pain  ? ?HPI: Dylan Good is a 51 y.o. 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 ? ?Had been off of carbometyx since Feb admission for pancreatitis until a couple of days ago when onc recommended he restart it again on 3/24. ? ?Unfortunately shortly thereafter on or around 3/28 he developed epigastric abd pain, radiating to back, symptoms similar to prior pancreatitis. ? ?Also has R flank pain that seems to have onset around the same time. ? ?No fevers, chills, N/V. ? ?  ?Review of Systems: As mentioned in the history of present illness. All other systems reviewed and are negative. ?Past Medical History:  ?Diagnosis Date  ? Cancer, metastatic to lung [facility])   ? Hypertension 07/01/2021  ? Renal cell adenocarcinoma, left ([place])   ? ?Past Surgical History:  ?Procedure Laterality Date  ? APPENDECTOMY    ? BIOPSY  07/03/2021  ? Procedure: BIOPSY;  Surgeon: Ronnette Juniper, MD;  Location: [facility];  Service: Gastroenterology;;  ? CHEST TUBE INSERTION Left 02/06/2021  ? Procedure: INSERTION PLEURAL DRAINAGE CATHETER;  Surgeon: Garner Nash, DO;  Location: [facility]OSCOPY;  Service: Pulmonary;  Laterality: Left;  ? CHEST TUBE INSERTION N/A 04/04/2021  ? Procedure: removal of pleurex cath;  Surgeon: Garner Nash, DO;  Location: [facility] ENDOSCOPY;  Service: Pulmonary;  Laterality: N/A;  ? ESOPHAGOGASTRODUODENOSCOPY (EGD) WITH PROPOFOL N/A 07/03/2021  ? Procedure: ESOPHAGOGASTRODUODENOSCOPY (EGD) WITH PROPOFOL;  Surgeon: Ronnette Juniper, MD;  Location: [facility] ENDOSCOPY;   Service: Gastroenterology;  Laterality: N/A;  ? ?Social History:  reports that he has been smoking cigarettes. He has never used smokeless tobacco. He reports that he does not drink alcohol and does not use drugs. ? ?No Known Allergies ? ?Family History  ?Problem Relation Age of Onset  ? Lupus Mother   ? Cancer Other   ? ? ?Prior to Admission medications   ?Medication Sig Start Date End Date Taking? Authorizing Provider  ?albuterol (ACCUNEB) 0.63 MG/3ML nebulizer solution Take 1 ampule by nebulization 3 (three) times daily as needed for shortness of breath or wheezing. 09/11/21  Yes [provider]  ?amLODipine (NORVASC) 5 MG tablet TAKE 1 TABLET (5 MG TOTAL) BY MOUTH DAILY. 07/17/21  Yes [name], MD  ?benzonatate (TESSALON) 200 MG capsule Take 1 capsule (200 mg total) by mouth 3 (three) times daily. 09/16/21  Yes [name], MD  ?cabozantinib (CABOMETYX) 40 MG tablet Take 1 tablet (40 mg total) by mouth daily. Take on an empty stomach, 1 hour before or 2 hours after meals. 09/14/21  Yes [name], MD  ?chlorpheniramine-HYDROcodone 10-8 MG/5ML Take 5 mLs by mouth every 12 (twelve) hours. ?Patient taking differently: Take 5 mLs by mouth every 12 (twelve) hours as needed for cough. 09/16/21  Yes [name], MD  ?dextromethorphan-guaiFENesin [facility] DM) 30-600 MG 12hr tablet Take 1 tablet by mouth 2 (two) times daily. ?Patient taking differently: Take 1 tablet by mouth 2 (two) times daily as needed for cough. 09/16/21  Yes [name], MD  ?magnesium oxide (  MAG-OX) 400 (240 Mg) MG tablet Take 1 tablet (400 mg total) by mouth daily. 09/16/21  Yes [name], MD  ?metoprolol succinate (TOPROL-XL) 25 MG 24 hr tablet Take 25 mg by mouth daily. 09/20/21  Yes [provider]  ?ondansetron (ZOFRAN) 4 MG tablet Take 1 tablet (4 mg total) by mouth every 6 (six) hours as needed for nausea. 09/16/21  Yes [name], MD  ?pantoprazole (PROTONIX) 40 MG tablet Take 1 tablet (40 mg  total) by mouth daily. 08/14/21  Yes Eugenie Filler, MD  ?polyethylene glycol (MIRALAX / GLYCOLAX) 17 g packet Take 17 g by mouth daily as needed for mild constipation. 09/16/21  Yes [name], MD  ?VENTOLIN HFA 108 (90 Base) MCG/ACT inhaler Inhale 2 puffs into the lungs every 4 (four) hours as needed for shortness of breath or wheezing. 09/11/21  Yes [provider]  ?D3-50 1.25 MG (50000 UT) capsule Take 50,000 Units by mouth once a week. Sundays ?Patient not taking: Reported on 10/04/2021 08/22/21   [provider]  ?nicotine (NICODERM CQ - DOSED IN MG/24 HOURS) 14 mg/24hr patch Place 1 patch (14 mg total) onto the skin daily. ?Patient not taking: Reported on 09/12/2021 08/15/21   Eugenie Filler, MD  ? ? ?Physical Exam: ?Vitals:  ? 10/04/21 1843 10/04/21 2000 10/04/21 2100 10/04/21 2200  ?BP: (!) 150/93 (!) 145/89 (!) 136/94 (!) 141/92  ?Pulse: 77 64 (!) 58 62  ?Resp: 18 12 13 12   ?Temp: 98.2 ?F (36.8 ?C)     ?TempSrc: Oral     ?SpO2: 99% 100% 100% 100%  ? ?Constitutional: NAD, calm, comfortable ?Eyes: PERRL, lids and conjunctivae normal ?ENMT: Mucous membranes are moist. Posterior pharynx clear of any exudate or lesions.Normal dentition.  ?Neck: normal, supple, no masses, no thyromegaly ?Respiratory: clear to auscultation bilaterally, no wheezing, no crackles. Normal respiratory effort. No accessory muscle use.  ?Cardiovascular: Regular rate and rhythm, no murmurs / rubs / gallops. No extremity edema. 2+ pedal pulses. No carotid bruits.  ?Abdomen: Epigastric TTP, also has R CVA TTP, no CVA TTP on left ?Musculoskeletal: no clubbing / cyanosis. No joint deformity upper and lower extremities. Good ROM, no contractures. Normal muscle tone.  ?Skin: no rashes, lesions, ulcers. No induration ?Neurologic: CN 2-12 grossly intact. Sensation intact, DTR normal. Strength 5/5 in all 4.  ?Psychiatric: Normal judgment and insight. Alert and oriented x 3. Normal mood.  ? ?Data Reviewed: ? ?Lipase 472 ?CT  AP: ?1) unchanged hazy appearance of pancreatic head, ? Pancreatitis ?2) unchanged R renal mass and stable metastatic dz ?3) new mild R hydro from a 38mm R ureteral stone ? ?UA with >50 WBC, mod HGB, small LE, 21-50 WBC. ? ?WBC neg on CBC ? ?Assessment and Plan: ?* Acute pancreatitis ?Recurrent acute pancreatitis. Admitted multiple times in last several months for same. ?DDx includes pain from kidney stone, but symptomatically seems more likely pancreatitis. ?? Carbometyx causing, HPI seems rather suspect here. ?NPO ?IVF ?Morphine PRN pain ?Consider GI consult in AM but ill hold off for now ?Holding carbometyx for the moment. ? ?Right nephrolithiasis ?R ureteral stone with mild R hydro. ?Possible second explanation for pain.  Suspect this causing the back pain and R CVA TTP. ?Dont really have any reason to suspect infection / UTI at this time. ?No SIRS, fevers, WBC on CBC, to suggest infection. ?Only small LE. ?Has WBC on UA but seems to always have this despite multiple negative cultures in past. ?Consider urology  consult in AM, but unless he develops fever, WBC, etc tonight, doubt that he will need emergent intervention. ?Will start medical expulsive therapy with flomax ? 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 ?Had been taken off of carbometyx due to concern that this was causing recurrent acute pancreatitis, but looks like this was recently resumed. ?Holding carbometyx ?Putting in IP consult to oncology into epic so pt shows up on their list. ? ? ? ? ? Advance Care Planning:   Code Status: Full Code  ? ?Consults: IP consult put into Epic for oncology ? ?Family Communication: No family in room ? ?Severity of Illness: ?The appropriate patient status for this patient is OBSERVATION. Observation status is judged to be reasonable and necessary in order to provide the required intensity of service to ensure the patient's safety. The patient's presenting symptoms,  physical exam findings, and initial radiographic and laboratory data in the context of their medical condition is felt to place them at decreased risk for further clinical deterioration. Furthermore, it is anticipa

## 2021-10-04 NOTE — Assessment & Plan Note (Addendum)
Recurrent acute pancreatitis. Admitted multiple times in last several months for same. ? ?Patient was admitted to the medical ward and was placed on supportive medical therapy with good toleration. ?He received IV fluids and IV antiacids, as needed antiemetics and analgesics. ?His symptoms improved and his diet was advanced with good toleration. ?Plan to follow up with gastroenterology as outpatient. ?

## 2021-10-05 ENCOUNTER — Other Ambulatory Visit: Payer: Self-pay

## 2021-10-05 ENCOUNTER — Encounter (HOSPITAL_COMMUNITY): Payer: Self-pay | Admitting: Internal Medicine

## 2021-10-05 DIAGNOSIS — C642 Malignant neoplasm of left kidney, except renal pelvis: Secondary | ICD-10-CM | POA: Diagnosis present

## 2021-10-05 DIAGNOSIS — Z79899 Other long term (current) drug therapy: Secondary | ICD-10-CM | POA: Diagnosis not present

## 2021-10-05 DIAGNOSIS — E871 Hypo-osmolality and hyponatremia: Secondary | ICD-10-CM | POA: Diagnosis not present

## 2021-10-05 DIAGNOSIS — F17210 Nicotine dependence, cigarettes, uncomplicated: Secondary | ICD-10-CM | POA: Diagnosis present

## 2021-10-05 DIAGNOSIS — R109 Unspecified abdominal pain: Secondary | ICD-10-CM | POA: Diagnosis present

## 2021-10-05 DIAGNOSIS — C7802 Secondary malignant neoplasm of left lung: Secondary | ICD-10-CM | POA: Diagnosis present

## 2021-10-05 DIAGNOSIS — C7972 Secondary malignant neoplasm of left adrenal gland: Secondary | ICD-10-CM | POA: Diagnosis present

## 2021-10-05 DIAGNOSIS — I10 Essential (primary) hypertension: Secondary | ICD-10-CM | POA: Diagnosis present

## 2021-10-05 DIAGNOSIS — J90 Pleural effusion, not elsewhere classified: Secondary | ICD-10-CM | POA: Diagnosis present

## 2021-10-05 DIAGNOSIS — C7971 Secondary malignant neoplasm of right adrenal gland: Secondary | ICD-10-CM | POA: Diagnosis present

## 2021-10-05 DIAGNOSIS — K859 Acute pancreatitis without necrosis or infection, unspecified: Secondary | ICD-10-CM | POA: Diagnosis not present

## 2021-10-05 DIAGNOSIS — Z8616 Personal history of COVID-19: Secondary | ICD-10-CM | POA: Diagnosis not present

## 2021-10-05 DIAGNOSIS — T451X5A Adverse effect of antineoplastic and immunosuppressive drugs, initial encounter: Secondary | ICD-10-CM | POA: Diagnosis present

## 2021-10-05 DIAGNOSIS — N200 Calculus of kidney: Secondary | ICD-10-CM | POA: Diagnosis not present

## 2021-10-05 DIAGNOSIS — D72818 Other decreased white blood cell count: Secondary | ICD-10-CM | POA: Diagnosis present

## 2021-10-05 DIAGNOSIS — D630 Anemia in neoplastic disease: Secondary | ICD-10-CM | POA: Diagnosis present

## 2021-10-05 DIAGNOSIS — C641 Malignant neoplasm of right kidney, except renal pelvis: Secondary | ICD-10-CM | POA: Diagnosis not present

## 2021-10-05 DIAGNOSIS — N132 Hydronephrosis with renal and ureteral calculous obstruction: Secondary | ICD-10-CM | POA: Diagnosis present

## 2021-10-05 DIAGNOSIS — C7801 Secondary malignant neoplasm of right lung: Secondary | ICD-10-CM | POA: Diagnosis present

## 2021-10-05 DIAGNOSIS — K853 Drug induced acute pancreatitis without necrosis or infection: Secondary | ICD-10-CM | POA: Diagnosis present

## 2021-10-05 LAB — CBC
HCT: 39.8 % (ref 39.0–52.0)
Hemoglobin: 12.8 g/dL — ABNORMAL LOW (ref 13.0–17.0)
MCH: 25.1 pg — ABNORMAL LOW (ref 26.0–34.0)
MCHC: 32.2 g/dL (ref 30.0–36.0)
MCV: 78.2 fL — ABNORMAL LOW (ref 80.0–100.0)
Platelets: 308 10*3/uL (ref 150–400)
RBC: 5.09 MIL/uL (ref 4.22–5.81)
RDW: 14.3 % (ref 11.5–15.5)
WBC: 3.4 10*3/uL — ABNORMAL LOW (ref 4.0–10.5)
nRBC: 0 % (ref 0.0–0.2)

## 2021-10-05 LAB — COMPREHENSIVE METABOLIC PANEL
ALT: 18 U/L (ref 0–44)
AST: 21 U/L (ref 15–41)
Albumin: 3 g/dL — ABNORMAL LOW (ref 3.5–5.0)
Alkaline Phosphatase: 64 U/L (ref 38–126)
Anion gap: 6 (ref 5–15)
BUN: 11 mg/dL (ref 6–20)
CO2: 21 mmol/L — ABNORMAL LOW (ref 22–32)
Calcium: 8.5 mg/dL — ABNORMAL LOW (ref 8.9–10.3)
Chloride: 106 mmol/L (ref 98–111)
Creatinine, Ser: 0.57 mg/dL — ABNORMAL LOW (ref 0.61–1.24)
GFR, Estimated: >60 mL/min (ref >60)
Glucose, Bld: 79 mg/dL (ref 70–99)
Potassium: 4 mmol/L (ref 3.5–5.1)
Sodium: 133 mmol/L — ABNORMAL LOW (ref 135–145)
Total Bilirubin: 0.2 mg/dL — ABNORMAL LOW (ref 0.3–1.2)
Total Protein: 6.6 g/dL (ref 6.5–8.1)

## 2021-10-05 MED ORDER — MORPHINE SULFATE (PF) 2 MG/ML IV SOLN
1.0000 mg | INTRAVENOUS | Status: DC | PRN
Start: 2021-10-05 — End: 2021-10-07
  Administered 2021-10-05 – 2021-10-07 (×11): 1 mg via INTRAVENOUS
  Filled 2021-10-05 (×11): qty 1

## 2021-10-05 MED ORDER — MORPHINE SULFATE (PF) 2 MG/ML IV SOLN
1.0000 mg | INTRAVENOUS | Status: AC | PRN
  Administered 2021-10-05 (×2): 1 mg via INTRAVENOUS
  Filled 2021-10-05 (×2): qty 1

## 2021-10-05 MED ORDER — LACTATED RINGERS IV SOLN: INTRAVENOUS | Status: DC

## 2021-10-05 NOTE — Assessment & Plan Note (Addendum)
Blood pressure remained stable during his hospitalization. ?Plan to continue with metoprolol and amlodipine.  ?

## 2021-10-05 NOTE — Assessment & Plan Note (Addendum)
Patient received IV fluids with good toleration, at the time of his discharge he is tolerating po well.  ?His serum cr is 0,65, K 3.6 and bicarbonate 22 with Na 134. ?Plan to follow up electrolytes as outpatient.  ?

## 2021-10-05 NOTE — Progress Notes (Addendum)
?  Progress Note ? ? ?Patient: Dylan Good CZY:[id number] DOB: 31-Mar-1971 DOA: 10/04/2021     0 ?DOS: the patient was seen and examined on 10/05/2021 ?  ?Brief hospital course: ?Mr. Dylan Good was admitted to the hospital with the working diagnosis of chemotherapy induced pancreatitis.  ? 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 He had recurrent abdominal pain after 4 days resumption of this medications on 03/24. On his initial physical examination his blood pressure was 150/93, HR 77, RR 18 and 02 saturation 99%, lungs clear to auscultation, heart with S1 and S2 present and rhythmic, abdomen tender to palpation at the epigastrium, with no rebound or guarding, right costovertebral angle tenderness, no lower extremity edema. ? ?Na 134, K 4,4 cl 104, bicarbonate 23, glucose 81, bun 14 cr 0,75  ?Wbc 4.0, hgb 13,7 hct 43,2 plt 347  ?Urine analysis with sg 1,019, >50 rbc, 21-50 wbc  ? ?Ct abdomen and pelvis with mild haziness at the pancreatic head, stable right renal mass. Mild right hydronephrosis with 6 mm calculus in the proximal ureter. Complex left renal lesion 1.6 cm. Stable metastatic disease pulmonary nodules, left pleural effusion, left chest wall mass and adrenal glands.  ? ?Assessment and Plan: ?* Acute pancreatitis ?Recurrent acute pancreatitis. Admitted multiple times in last several months for same. ? ?Plan to continue supportive medical therapy with bowel rest, and as needed analgesics. ?Continue IV fluids, as needed antiemetics. ?Scheduled antiacid therapy.  ? ?Right nephrolithiasis ?Right sided pain has improved. ?Continue with flomax. ?Considering mild hydronephrosis, will consult urology for recommendations. ?Follow up renal function in am.  ? 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 ?Holding carbometyx ? ?Follow as outpatient,  ? ?Essential hypertension ?Continue blood pressure control with amlodipine and metoprolol.  ? ?Hyponatremia ?Renal function with serum cr at 0,57, K is 4,0 and serum bicarbonate at 21 with Na at 133. ?Continue volume correction with isotonic saline. ?Follow up renal function and electrolytes in am.  ? ? ? ? ?  ? ?Subjective: patient continue with abdominal pain, mild improvement but not back to baseline, no nausea or vomiting, right flank pain has improved.  ? ?Physical Exam: ?Vitals:  ? 10/05/21 0041 10/05/21 0458 10/05/21 1006 10/05/21 1318  ?BP: (!) 146/92 106/85 124/88 120/72  ?Pulse: (!) 57 66 (!) 58 (!) 52  ?Resp: 18 18 16    ?Temp: 97.6 ?F (36.4 ?C) 98 ?F (36.7 ?C) (!) 97.5 ?F (36.4 ?C) 97.8 ?F (36.6 ?C)  ?TempSrc: Oral Oral Oral Oral  ?SpO2: 100% 98% 100% 98%  ?Weight: 69.7 kg     ? ?Neurology awake and alert ?ENT with mild pallor ?Cardiovascular with S1 and S2 present and rhythmic with no gallops or murmurs ?Respiratory with no rales or wheezing ?Abdomen not distended but tender to palpation at the epigastrium, no rebound. ?No lower extremity edema  ?Data Reviewed: ? ? ? ?Family Communication: no family at the bedside  ? ?Disposition: ?Status is: Inpatient ?Remains inpatient appropriate because: pancreatitis,. Right kidney stone  ? Planned Discharge Destination: Home ? ? ? ?Author: ?Tawni Millers, MD ?10/05/2021 3:26 PM ? ?For on call review [email or web address].  ?

## 2021-10-05 NOTE — TOC Initial Note (Signed)
Transition of Care (TOC) - Initial/Assessment Note  ? ? ?Patient Details  ?Name: Dylan Good ?MRN: [id number] ?Date of Birth: October 18, 1970 ? ?Transition of Care (TOC) CM/SW Contact:    ?Tawanna Cooler, RN ?Phone Number: ?10/05/2021, 12:11 PM ? ?Clinical Narrative:                 ? ?Transition of Care Department [facility]) has reviewed patient and no TOC needs have been identified at this time. We will continue to monitor patient advancement through interdisciplinary progression rounds. If new patient transition needs arise, please place a TOC consult. ? ?Expected Discharge Plan: Home/Self Care ?Barriers to Discharge: Continued Medical Work up ? ? ?Expected Discharge Plan and Services ?Expected Discharge Plan: Home/Self Care ?  ?  ?Living arrangements for the past 2 months: Apartment ?                ?  ?Prior Living Arrangements/Services ?Living arrangements for the past 2 months: Apartment ?Lives with:: Significant Other ?Patient language and need for interpreter reviewed:: Yes ?Do you feel safe going back to the place where you live?: Yes      ?Need for Family Participation in Patient Care: Yes (Comment) ?Care giver support system in place?: Yes (comment) ?  ?Criminal Activity/Legal Involvement Pertinent to Current Situation/Hospitalization: No - Comment as needed ? ?Activities of Daily Living ?Home Assistive Devices/Equipment: None ?ADL Screening (condition at time of admission) ?Patient's cognitive ability adequate to safely complete daily activities?: Yes ?Is the patient deaf or have difficulty hearing?: No ?Does the patient have difficulty seeing, even when wearing glasses/contacts?: No ?Does the patient have difficulty concentrating, remembering, or making decisions?: No ?Patient able to express need for assistance with ADLs?: Yes ?Does the patient have difficulty dressing or bathing?: No ?Independently performs ADLs?: Yes (appropriate for developmental age) ?Does the patient have difficulty walking or climbing  stairs?: No ?Weakness of Legs: None ?Weakness of Arms/Hands: None ? ?  ?Orientation: : Oriented to Self, Oriented to Place, Oriented to  Time, Oriented to Situation ?Alcohol / Substance Use: Tobacco Use ?Psych Involvement: No (comment) ? ?Admission diagnosis:  Acute pancreatitis [K85.90] ?Wheezing [R06.2] ?Acute pancreatitis without infection or necrosis, unspecified pancreatitis type [K85.90] ?Pancreatitis [K85.90] ?Patient Active Problem List  ? Diagnosis Date Noted  ? Right nephrolithiasis 10/04/2021  ? Hypercalcemia of malignancy 09/12/2021  ? Sinus tachycardia 09/12/2021  ? Neutropenia ([place]) 08/31/2021  ? Recent COVID-19 virus infection, possible bacterial pneumonia 08/29/2021  ? Pancreatitis 08/29/2021  ? Lung metastases ([place]) 08/13/2021  ? Intractable pain 07/01/2021  ? Hypertension 07/01/2021  ? Tobacco use 07/01/2021  ? Acute pancreatitis 07/01/2021  ? Pleural effusion on left 01/31/2021  ? Brain metastases ([place]) 01/24/2021  ? Hypercalcemia 01/15/2021  ? Kidney cancer, primary, with metastasis from kidney to other site [facility]) 12/29/2020  ? ?PCP:  Pcp, No ?Pharmacy:   ?[facility] [place], [address] ?[place] ?[place] [address] ?Phone: [phone] Fax: [phone] ? ? ?Readmission Risk Interventions ? ?  10/05/2021  ? 12:10 PM 09/14/2021  ? 10:34 AM  ?Readmission Risk Prevention Plan  ?Transportation Screening Complete Complete  ?PCP or Specialist Appt within 3-5 Days  Complete  ?[place] or Home Care Consult  Complete  ?Social Work Consult for [place] Planning/Counseling  Complete  ?Palliative Care Screening  Complete  ?Medication Review [employer]) Complete Complete  ?PCP or Specialist appointment within 3-5 days of discharge Complete   ?  [place] or Home Care Consult Complete   ?SW Recovery Care/Counseling Consult Complete   ?Palliative Care Screening Complete   ?[place] Not Applicable   ? ? ? ?

## 2021-10-05 NOTE — Hospital Course (Addendum)
Mr. Sanker was admitted to the hospital with the working diagnosis of chemotherapy induced pancreatitis.  ? 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 He had recurrent abdominal pain after 4 days resumption of this medications on 03/24. On his initial physical examination his blood pressure was 150/93, HR 77, RR 18 and 02 saturation 99%, lungs clear to auscultation, heart with S1 and S2 present and rhythmic, abdomen tender to palpation at the epigastrium, with no rebound or guarding, right costovertebral angle tenderness, no lower extremity edema. ? ?Na 134, K 4,4 cl 104, bicarbonate 23, glucose 81, bun 14 cr 0,75  ?Wbc 4.0, hgb 13,7 hct 43,2 plt 347  ?Urine analysis with sg 1,019, >50 rbc, 21-50 wbc  ? ?Ct abdomen and pelvis with mild haziness at the pancreatic head, stable right renal mass. Mild right hydronephrosis with 6 mm calculus in the proximal ureter. Complex left renal lesion 1.6 cm. Stable metastatic disease pulmonary nodules, left pleural effusion, left chest wall mass and adrenal glands.  ? ?Patient placed on supportive medical therapy with improvement of his symptoms. Slowly advancing his diet with good toleration. ? ?Patient will follow up as outpatient with gastroenterology and urology. ?

## 2021-10-06 DIAGNOSIS — I10 Essential (primary) hypertension: Secondary | ICD-10-CM | POA: Diagnosis not present

## 2021-10-06 DIAGNOSIS — E871 Hypo-osmolality and hyponatremia: Secondary | ICD-10-CM | POA: Diagnosis not present

## 2021-10-06 DIAGNOSIS — C641 Malignant neoplasm of right kidney, except renal pelvis: Secondary | ICD-10-CM | POA: Diagnosis not present

## 2021-10-06 DIAGNOSIS — K859 Acute pancreatitis without necrosis or infection, unspecified: Secondary | ICD-10-CM | POA: Diagnosis not present

## 2021-10-06 LAB — BASIC METABOLIC PANEL
Anion gap: 6 (ref 5–15)
BUN: 7 mg/dL (ref 6–20)
CO2: 23 mmol/L (ref 22–32)
Calcium: 8.6 mg/dL — ABNORMAL LOW (ref 8.9–10.3)
Chloride: 105 mmol/L (ref 98–111)
Creatinine, Ser: 0.7 mg/dL (ref 0.61–1.24)
GFR, Estimated: >60 mL/min (ref >60)
Glucose, Bld: 76 mg/dL (ref 70–99)
Potassium: 4.1 mmol/L (ref 3.5–5.1)
Sodium: 134 mmol/L — ABNORMAL LOW (ref 135–145)

## 2021-10-06 LAB — CBC
HCT: 42.8 % (ref 39.0–52.0)
Hemoglobin: 13.4 g/dL (ref 13.0–17.0)
MCH: 24.7 pg — ABNORMAL LOW (ref 26.0–34.0)
MCHC: 31.3 g/dL (ref 30.0–36.0)
MCV: 78.8 fL — ABNORMAL LOW (ref 80.0–100.0)
Platelets: 290 10*3/uL (ref 150–400)
RBC: 5.43 MIL/uL (ref 4.22–5.81)
RDW: 14.4 % (ref 11.5–15.5)
WBC: 2.6 10*3/uL — ABNORMAL LOW (ref 4.0–10.5)
nRBC: 0 % (ref 0.0–0.2)

## 2021-10-06 NOTE — Progress Notes (Signed)
?Progress Note ? ? ?Patient: Dylan Good KVQ:[id number] DOB: 1971-02-02 DOA: 10/04/2021     1 ?DOS: the patient was seen and examined on 10/06/2021 ?  ?Brief hospital course: ?[name] was admitted to the hospital with the working diagnosis of chemotherapy induced pancreatitis.  ? 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 He had recurrent abdominal pain after 4 days resumption of this medications on 03/24. On his initial physical examination his blood pressure was 150/93, HR 77, RR 18 and 02 saturation 99%, lungs clear to auscultation, heart with S1 and S2 present and rhythmic, abdomen tender to palpation at the epigastrium, with no rebound or guarding, right costovertebral angle tenderness, no lower extremity edema. ? ?Na 134, K 4,4 cl 104, bicarbonate 23, glucose 81, bun 14 cr 0,75  ?Wbc 4.0, hgb 13,7 hct 43,2 plt 347  ?Urine analysis with sg 1,019, >50 rbc, 21-50 wbc  ? ?Ct abdomen and pelvis with mild haziness at the pancreatic head, stable right renal mass. Mild right hydronephrosis with 6 mm calculus in the proximal ureter. Complex left renal lesion 1.6 cm. Stable metastatic disease pulmonary nodules, left pleural effusion, left chest wall mass and adrenal glands.  ? ?Patient placed on supportive medical therapy with improvement of his symptoms. Slowly advancing his diet.  ? ?Assessment and Plan: ?* Acute pancreatitis ?Recurrent acute pancreatitis. Admitted multiple times in last several months for same. ? ?Abdominal pain is improving, no nausea or vomiting, no abdominal distention. ?Plan to advance diet to soft and continue with supportive medical therapy. ?Decrease IV fluids to 50 ml per hr. ?Continue pain control with as needed morphine and nausea control with as needed ondansetron. ?Continue with antiacid therapy.  ? ?Right nephrolithiasis ?Continue with right sided flank  pain. ?Continue with flomax. ?Will consult urology for recommendations.  ? 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 ?Had been taken off of carbometyx due to concern that this was causing recurrent acute pancreatitis,  ?Holding carbometyx ? ?Follow as outpatient.   ? ?Leukopenia, wbc today is 2,6. ?Continue close follow up, noted no anemia or thrombocytopenia.  ? ?Essential hypertension ?Continue blood pressure control with amlodipine and metoprolol.  ? ?Hyponatremia ?Patient with improvement in abdominal pain.  ?Plan to decrease IV fluids to 50 ml per hr of lactate ringers and continue close follow up of electrolytes.  ? ?Na 134, K 4,1 and serum bicarbonate at 23. Cr is 0,70.  ?Advance diet to soft.  ? ? ? ? ?  ? ?Subjective: patient with improvement in abdominal pain, no nausea or vomiting ,no dyspnea or chest pain. Continue to have right flank pain. No hematuria or dysuria  ? ?Physical Exam: ?Vitals:  ? 10/05/21 1318 10/05/21 2001 10/06/21 0501 10/06/21 0748  ?BP: 120/72 132/81 (!) 129/96   ?Pulse: (!) 52 60 69   ?Resp:  18 18   ?Temp: 97.8 ?F (36.6 ?C) 97.7 ?F (36.5 ?C) 97.9 ?F (36.6 ?C)   ?TempSrc: Oral Oral Oral   ?SpO2: 98% 100% 100% 98%  ?Weight:      ? ?Neurology awake and alert ?ENT with mild pallor ?Cardiovascular with S1 and S2 present and rhythmic ?Respiratory with no rales or wheezing ?Abdomen not distended and not tender to superficial palpation ?No lower extremity edema  ?Data Reviewed: ? ? ? ?Family Communication: I spoke with patient's family at the bedside,  we talked in detail about patient's condition, plan of care and prognosis and all questions were addressed. ? ? ?Disposition: ?Status is: Inpatient ?Remains inpatient appropriate because: acute pancreatitis  ? Planned Discharge Destination: Home ? ? ? ?Author: ?Tawni Millers, MD ?10/06/2021 10:46 AM ? ?For on call review [email or web address]  ?

## 2021-10-07 DIAGNOSIS — K859 Acute pancreatitis without necrosis or infection, unspecified: Secondary | ICD-10-CM | POA: Diagnosis not present

## 2021-10-07 DIAGNOSIS — C641 Malignant neoplasm of right kidney, except renal pelvis: Secondary | ICD-10-CM | POA: Diagnosis not present

## 2021-10-07 DIAGNOSIS — I10 Essential (primary) hypertension: Secondary | ICD-10-CM | POA: Diagnosis not present

## 2021-10-07 DIAGNOSIS — E871 Hypo-osmolality and hyponatremia: Secondary | ICD-10-CM | POA: Diagnosis not present

## 2021-10-07 LAB — CBC
HCT: 36.3 % — ABNORMAL LOW (ref 39.0–52.0)
Hemoglobin: 11.7 g/dL — ABNORMAL LOW (ref 13.0–17.0)
MCH: 24.9 pg — ABNORMAL LOW (ref 26.0–34.0)
MCHC: 32.2 g/dL (ref 30.0–36.0)
MCV: 77.4 fL — ABNORMAL LOW (ref 80.0–100.0)
Platelets: 278 10*3/uL (ref 150–400)
RBC: 4.69 MIL/uL (ref 4.22–5.81)
RDW: 14.3 % (ref 11.5–15.5)
WBC: 3.1 10*3/uL — ABNORMAL LOW (ref 4.0–10.5)
nRBC: 0 % (ref 0.0–0.2)

## 2021-10-07 LAB — BASIC METABOLIC PANEL
Anion gap: 6 (ref 5–15)
BUN: 8 mg/dL (ref 6–20)
CO2: 22 mmol/L (ref 22–32)
Calcium: 8.6 mg/dL — ABNORMAL LOW (ref 8.9–10.3)
Chloride: 106 mmol/L (ref 98–111)
Creatinine, Ser: 0.65 mg/dL (ref 0.61–1.24)
GFR, Estimated: >60 mL/min (ref >60)
Glucose, Bld: 160 mg/dL — ABNORMAL HIGH (ref 70–99)
Potassium: 3.6 mmol/L (ref 3.5–5.1)
Sodium: 134 mmol/L — ABNORMAL LOW (ref 135–145)

## 2021-10-07 MED ORDER — TAMSULOSIN HCL 0.4 MG PO CAPS
0.4000 mg | ORAL_CAPSULE | Freq: Every day | ORAL | 0 refills | Status: AC
Start: 2021-10-08 — End: 2021-11-07

## 2021-10-07 MED ORDER — TRAMADOL HCL 50 MG PO TABS: 50.0000 mg | ORAL_TABLET | Freq: Four times a day (QID) | ORAL | 0 refills | Status: DC | PRN

## 2021-10-07 MED ORDER — TRAMADOL HCL 50 MG PO TABS: 50.0000 mg | ORAL_TABLET | Freq: Four times a day (QID) | ORAL | Status: DC | PRN

## 2021-10-07 MED ORDER — ACETAMINOPHEN 325 MG PO TABS: 650.0000 mg | ORAL_TABLET | Freq: Four times a day (QID) | ORAL | Status: DC | PRN

## 2021-10-07 NOTE — Discharge Summary (Signed)
?Physician Discharge Summary ?  ?Patient: Dylan Good MRN: [id number] DOB: [birth year]  ?Admit date:     10/04/2021  ?Discharge date: 10/07/21  ?Discharge Physician: [name]  ? ?PCP: Pcp, No  ? ?Recommendations at discharge:  ? ? Continue pain control with as needed tramadol ?Follow up with gastroenterology as scheduled ?Follow up with Urology as outpatient, continue with flomax.  ? ?Discharge Diagnoses: ?Principal Problem: ?  Acute pancreatitis ?Active Problems: ?  Right nephrolithiasis ?  Kidney cancer, primary, with metastasis from kidney to other site [facility]) ?  Hyponatremia ?  Essential hypertension ? ?Resolved Problems: ?  * No resolved hospital problems. * ? ?Hospital Course: ?Dylan Good was admitted to the hospital with the working diagnosis of chemotherapy induced pancreatitis.  ? 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 He had recurrent abdominal pain after 4 days resumption of this medications on 03/24. On his initial physical examination his blood pressure was 150/93, HR 77, RR 18 and 02 saturation 99%, lungs clear to auscultation, heart with S1 and S2 present and rhythmic, abdomen tender to palpation at the epigastrium, with no rebound or guarding, right costovertebral angle tenderness, no lower extremity edema. ? ?Na 134, K 4,4 cl 104, bicarbonate 23, glucose 81, bun 14 cr 0,75  ?Wbc 4.0, hgb 13,7 hct 43,2 plt 347  ?Urine analysis with sg 1,019, >50 rbc, 21-50 wbc  ? ?Ct abdomen and pelvis with mild haziness at the pancreatic head, stable right renal mass. Mild right hydronephrosis with 6 mm calculus in the proximal ureter. Complex left renal lesion 1.6 cm. Stable metastatic disease pulmonary nodules, left pleural effusion, left chest wall mass and adrenal glands.  ? ?Patient placed on supportive medical therapy with improvement of his symptoms. Slowly advancing his  diet with good toleration. ? ?Patient will follow up as outpatient with gastroenterology and urology. ? ?Assessment and Plan: ?* Acute pancreatitis ?Recurrent acute pancreatitis. Admitted multiple times in last several months for same. ? ?Patient was admitted to the medical ward and was placed on supportive medical therapy with good toleration. ?He received IV fluids and IV antiacids, as needed antiemetics and analgesics. ?His symptoms improved and his diet was advanced with good toleration. ?Plan to follow up with gastroenterology as outpatient. ? ?Right nephrolithiasis ?His back pain improved, patient with no hematuria or fevers. ?I spoke with Dr Tresa Moore from urology, and plan to continue conservative care and follow up as outpatient.  ? ? ?Kidney cancer, primary, with metastasis from kidney to other site [facility]) ?Had been taken off of carbometyx due to concern that this was causing recurrent acute pancreatitis,  ? ?At the time of discharge his cell count showed wbc 3,1 hgb 11,7 hct 36,3 and plr 278. 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  ? ?Essential hypertension ?Blood pressure remained stable during his hospitalization. ?Plan to continue with metoprolol and amlodipine.  ? ?Hyponatremia ?Patient received IV fluids with good toleration, at the time of his discharge he is tolerating po well.  ?His serum cr is 0,65, K 3.6 and bicarbonate 22 with Na 134. ?Plan to follow up electrolytes as outpatient.  ? ? ? ? ?  ? ? ?Consultants: gastroenterology and urology over the phone  ?Procedures performed: none  ?Disposition: Home ?Diet recommendation:  ?Discharge Diet Orders (From admission, onward)  ? ?  Start     Ordered  ?  10/07/21 0000  Diet - low sodium heart healthy       ? 10/07/21 1225  ? ?  ?  ? ?  ? ?Regular diet/ soft and advance as tolerated.  ?DISCHARGE MEDICATION: ?Allergies as of 10/07/2021   ?No Known Allergies ?  ? ?  ?Medication List  ?  ? ?STOP taking these medications    ? ?Cabometyx 40 MG tablet ?Generic drug: cabozantinib ?  ?chlorpheniramine-HYDROcodone 10-8 MG/5ML ?  ?D3-50 1.25 MG (50000 UT) capsule ?Generic drug: Cholecalciferol ?  ?nicotine 14 mg/24hr patch ?Commonly known as: NICODERM CQ - dosed in mg/24 hours ?  ? ?  ? ?TAKE these medications   ? ?acetaminophen 325 MG tablet ?Commonly known as: TYLENOL ?Take 2 tablets (650 mg total) by mouth every 6 (six) hours as needed for mild pain (or Fever >/= 101). ?  ?albuterol 0.63 MG/3ML nebulizer solution ?Commonly known as: ACCUNEB ?Take 1 ampule by nebulization 3 (three) times daily as needed for shortness of breath or wheezing. ?  ?Ventolin HFA 108 (90 Base) MCG/ACT inhaler ?Generic drug: albuterol ?Inhale 2 puffs into the lungs every 4 (four) hours as needed for shortness of breath or wheezing. ?  ?amLODipine 5 MG tablet ?Commonly known as: NORVASC ?TAKE 1 TABLET (5 MG TOTAL) BY MOUTH DAILY. ?  ?benzonatate 200 MG capsule ?Commonly known as: TESSALON ?Take 1 capsule (200 mg total) by mouth 3 (three) times daily. ?  ?dextromethorphan-guaiFENesin 30-600 MG 12hr tablet ?Commonly known as: Caliente DM ?Take 1 tablet by mouth 2 (two) times daily. ?What changed:  ?when to take this ?reasons to take this ?  ?magnesium oxide 400 (240 Mg) MG tablet ?Commonly known as: MAG-OX ?Take 1 tablet (400 mg total) by mouth daily. ?  ?metoprolol succinate 25 MG 24 hr tablet ?Commonly known as: TOPROL-XL ?Take 25 mg by mouth daily. ?  ?ondansetron 4 MG tablet ?Commonly known as: ZOFRAN ?Take 1 tablet (4 mg total) by mouth every 6 (six) hours as needed for nausea. ?  ?pantoprazole 40 MG tablet ?Commonly known as: Protonix ?Take 1 tablet (40 mg total) by mouth daily. ?  ?polyethylene glycol 17 g packet ?Commonly known as: MIRALAX / GLYCOLAX ?Take 17 g by mouth daily as needed for mild constipation. ?  ?tamsulosin 0.4 MG Caps capsule ?Commonly known as: FLOMAX ?Take 1 capsule (0.4 mg total) by mouth daily. ?Start taking on: October 08, 2021 ?   ?traMADol 50 MG tablet ?Commonly known as: ULTRAM ?Take 1 tablet (50 mg total) by mouth every 6 (six) hours as needed for severe pain. ?  ? ?  ? ? ?Discharge Exam: ?Danley Danker Weights  ? 10/05/21 0041  ?Weight: 69.7 kg  ? ?BP 118/82   Pulse 74   Temp 98.1 ?F (36.7 ?C) (Oral)   Resp 14   Wt 69.7 kg   SpO2 97%   BMI 22.69 kg/m?  ? ?Patient feeling better, mild back pain and improved abdominal pain, no nausea or vomiting, tolerating po well.  ? ?Neurology awake and alert ?ENT with mild pallor ?Cardiovascular with S1 and S2 present and rhythmic with no gallops or murmurs ?Respiratory with no rales ?Abdomen not distended and not tender ? ? ?Condition at discharge: stable ? ?The results of significant diagnostics from this hospitalization (including imaging, microbiology, ancillary and laboratory) are listed below for reference.  ? ?Imaging Studies: ?DG Chest 2 View ? ?Result Date: 09/12/2021 ?CLINICAL DATA:  Shortness of breath, cough, recent COVID positivity. Possible sepsis. EXAM: CHEST - 2  VIEW COMPARISON:  08/28/2021 and CT chest 08/28/2021. FINDINGS: Trachea is midline. Heart size normal. Mediastinal adenopathy and destructive left chest wall mass, better evaluated on CT chest 08/28/2021. Small left pleural effusion with pleural nodularity. Nodular areas of airspace consolidation bilaterally, worst in the left lower lobe. IMPRESSION: Mediastinal adenopathy, destructive left chest wall mass, small left pleural effusion/pleural nodularity and scattered areas of nodular airspace consolidation, all considered metastatic on CT chest 08/28/2021. No definite intervening acute findings. Electronically Signed   By: Lorin Picket M.D.   On: 09/12/2021 13:15  ? ?CT ABDOMEN PELVIS W CONTRAST ? ?Result Date: 10/04/2021 ?CLINICAL DATA:  Epigastric pain. EXAM: CT ABDOMEN AND PELVIS WITH CONTRAST TECHNIQUE: Multidetector CT imaging of the abdomen and pelvis was performed using the standard protocol following bolus administration  of intravenous contrast. RADIATION DOSE REDUCTION: This exam was performed according to the departmental dose-optimization program which includes automated exposure control, adjustment of the mA and/or kV according to

## 2021-10-07 NOTE — Progress Notes (Signed)
Discharge instructions explained to patient. He states understanding. Next medications due were written on his discharge papers. He knows there are prescriptions to pick up. Discharged via wheelchair to wife. ?

## 2021-10-09 ENCOUNTER — Other Ambulatory Visit (HOSPITAL_COMMUNITY): Payer: Self-pay

## 2021-10-11 ENCOUNTER — Other Ambulatory Visit (HOSPITAL_COMMUNITY): Payer: Self-pay

## 2021-10-12 ENCOUNTER — Other Ambulatory Visit (HOSPITAL_COMMUNITY): Payer: Self-pay

## 2021-11-01 ENCOUNTER — Telehealth: Payer: Self-pay | Admitting: Oncology

## 2021-11-01 NOTE — Telephone Encounter (Signed)
Called patient regarding upcoming appointments, left a voicemail. 

## 2021-11-02 ENCOUNTER — Inpatient Hospital Stay: Payer: Medicaid Other

## 2021-11-02 ENCOUNTER — Inpatient Hospital Stay (HOSPITAL_BASED_OUTPATIENT_CLINIC_OR_DEPARTMENT_OTHER): Payer: Medicaid Other | Admitting: Oncology

## 2021-11-02 ENCOUNTER — Other Ambulatory Visit: Payer: Self-pay

## 2021-11-02 ENCOUNTER — Inpatient Hospital Stay: Payer: Medicaid Other | Attending: Oncology

## 2021-11-02 VITALS — BP 118/83 | HR 130 | Temp 98.4°F | Resp 16 | Wt 149.3 lb

## 2021-11-02 DIAGNOSIS — C7931 Secondary malignant neoplasm of brain: Secondary | ICD-10-CM | POA: Diagnosis not present

## 2021-11-02 DIAGNOSIS — Z923 Personal history of irradiation: Secondary | ICD-10-CM | POA: Diagnosis not present

## 2021-11-02 DIAGNOSIS — N2889 Other specified disorders of kidney and ureter: Secondary | ICD-10-CM

## 2021-11-02 DIAGNOSIS — C641 Malignant neoplasm of right kidney, except renal pelvis: Secondary | ICD-10-CM | POA: Insufficient documentation

## 2021-11-02 DIAGNOSIS — K859 Acute pancreatitis without necrosis or infection, unspecified: Secondary | ICD-10-CM | POA: Insufficient documentation

## 2021-11-02 DIAGNOSIS — C7800 Secondary malignant neoplasm of unspecified lung: Secondary | ICD-10-CM | POA: Insufficient documentation

## 2021-11-02 DIAGNOSIS — F17210 Nicotine dependence, cigarettes, uncomplicated: Secondary | ICD-10-CM | POA: Diagnosis not present

## 2021-11-02 DIAGNOSIS — J90 Pleural effusion, not elsewhere classified: Secondary | ICD-10-CM

## 2021-11-02 DIAGNOSIS — R918 Other nonspecific abnormal finding of lung field: Secondary | ICD-10-CM

## 2021-11-02 LAB — CBC WITH DIFFERENTIAL (CANCER CENTER ONLY)
Abs Immature Granulocytes: 0.01 10*3/uL (ref 0.00–0.07)
Basophils Absolute: 0 10*3/uL (ref 0.0–0.1)
Basophils Relative: 0 %
Eosinophils Absolute: 0.1 10*3/uL (ref 0.0–0.5)
Eosinophils Relative: 3 %
HCT: 36.9 % — ABNORMAL LOW (ref 39.0–52.0)
Hemoglobin: 12.1 g/dL — ABNORMAL LOW (ref 13.0–17.0)
Immature Granulocytes: 0 %
Lymphocytes Relative: 31 %
Lymphs Abs: 1.6 10*3/uL (ref 0.7–4.0)
MCH: 25.3 pg — ABNORMAL LOW (ref 26.0–34.0)
MCHC: 32.8 g/dL (ref 30.0–36.0)
MCV: 77 fL — ABNORMAL LOW (ref 80.0–100.0)
Monocytes Absolute: 0.8 10*3/uL (ref 0.1–1.0)
Monocytes Relative: 16 %
Neutro Abs: 2.5 10*3/uL (ref 1.7–7.7)
Neutrophils Relative %: 50 %
Platelet Count: 311 10*3/uL (ref 150–400)
RBC: 4.79 MIL/uL (ref 4.22–5.81)
RDW: 16.7 % — ABNORMAL HIGH (ref 11.5–15.5)
WBC Count: 5 10*3/uL (ref 4.0–10.5)
nRBC: 0 % (ref 0.0–0.2)

## 2021-11-02 LAB — CMP (CANCER CENTER ONLY)
ALT: 5 U/L (ref 0–44)
AST: 8 U/L — ABNORMAL LOW (ref 15–41)
Albumin: 4 g/dL (ref 3.5–5.0)
Alkaline Phosphatase: 65 U/L (ref 38–126)
Anion gap: 8 (ref 5–15)
BUN: 10 mg/dL (ref 6–20)
CO2: 19 mmol/L — ABNORMAL LOW (ref 22–32)
Calcium: 12.1 mg/dL — ABNORMAL HIGH (ref 8.9–10.3)
Chloride: 106 mmol/L (ref 98–111)
Creatinine: 0.61 mg/dL (ref 0.61–1.24)
GFR, Estimated: >60 mL/min (ref >60)
Glucose, Bld: 87 mg/dL (ref 70–99)
Potassium: 3.9 mmol/L (ref 3.5–5.1)
Sodium: 133 mmol/L — ABNORMAL LOW (ref 135–145)
Total Bilirubin: 1.1 mg/dL (ref 0.3–1.2)
Total Protein: 7.7 g/dL (ref 6.5–8.1)

## 2021-11-02 MED ORDER — SODIUM CHLORIDE 0.9 % IV SOLN: Freq: Once | INTRAVENOUS | Status: DC

## 2021-11-02 MED ORDER — SODIUM CHLORIDE 0.9 % IV SOLN: Freq: Once | INTRAVENOUS | Status: AC

## 2021-11-02 MED ORDER — ZOLEDRONIC ACID 4 MG/100ML IV SOLN
4.0000 mg | Freq: Once | INTRAVENOUS | Status: AC
  Administered 2021-11-02: 4 mg via INTRAVENOUS
  Filled 2021-11-02: qty 100

## 2021-11-02 NOTE — Patient Instructions (Signed)

## 2021-11-02 NOTE — Progress Notes (Signed)
Hematology and Oncology Follow Up Visit ? ?[name] ?[id number] ?1970/07/19 51 y.o. ?11/02/2021 9:32 AM ?Pcp, Hoyle Sauer, MD  ? ?Principle Diagnosis: 51 year old man with kidney cancer diagnosed in June 2022.  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 ? ? ?Prior Therapy:  ? ?He is status post thoracentesis completed in June 2022 and repeated on January 02, 2021. ? ?He is s/p Pleurx catheter insertion in August 2022. ? ?He is status post stereotactic radiosurgery to the brain completed on January 29, 2021.  He received 20 Gray in 1 fraction. ? ?He is status post kidney biopsy completed on March 06, 2021.  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 ? ?Ipilimumab 1 mg/kg with nivolumab 3 mg/kg started on January 15, 2021.  He completed 4 cycles of therapy. ? ?Nivolumab 480 mg every 4 weeks to start on April 10, 2021.  Therapy discontinued due to progression of disease. ? ?Current therapy: Cabometyx 40 mg started on May 11, 2021.  Therapy interrupted during frequent hospitalization between December and March 2023.  Therapy discontinued in April 2023. ? ?Interim History: Mr. Nicotra returns today for a follow-up visit.  Since last visit, he was hospitalized again during end of March for pancreatitis which was attributed again to Cabometyx.  He has been off of it since his discharge.  He had denies any nausea, vomiting or abdominal pain.  His appetite is improving.  He continues to have pulmonary complaints including occasional cough and dyspnea.  He denies any mental confusion or lethargy. ? ?Medications: Updated on review. ? ? ? ? ?Allergies: No Known Allergies ? ? ? ?Physical Exam: ? ? ? ?Blood pressure 118/83, pulse (!) 130, temperature 98.4 ?F (36.9 ?C), temperature source Temporal, resp. rate 16, weight 149 lb 4.8 oz (67.7 kg), SpO2 98 %. ? ? ? ?ECOG: 1 ? ? ? ?General appearance: Alert, awake without any distress. ?Head: Atraumatic without  abnormalities ?Oropharynx: Without any thrush or ulcers. ?Eyes: No scleral icterus. ?Lymph nodes: No lymphadenopathy noted in the cervical, supraclavicular, or axillary nodes ?Heart:regular rate and rhythm, without any murmurs or gallops.   ?Lung: Clear to auscultation without any rhonchi, wheezes or dullness to percussion. ?Abdomin: Soft, nontender without any shifting dullness or ascites. ?Musculoskeletal: No clubbing or cyanosis. ?Neurological: No motor or sensory deficits. ?Skin: No rashes or lesions. ? ? ? ? ? ? ?Lab Results: ?Lab Results  ?Component Value Date  ? WBC 3.1 (L) 10/07/2021  ? HGB 11.7 (L) 10/07/2021  ? HCT 36.3 (L) 10/07/2021  ? MCV 77.4 (L) 10/07/2021  ? PLT 278 10/07/2021  ? ?  Chemistry   ?   ?Component Value Date/Time  ? NA 134 (L) 10/07/2021 0603  ? K 3.6 10/07/2021 0603  ? CL 106 10/07/2021 0603  ? CO2 22 10/07/2021 0603  ? BUN 8 10/07/2021 0603  ? CREATININE 0.65 10/07/2021 0603  ? CREATININE 0.78 09/28/2021 1228  ?    ?Component Value Date/Time  ? CALCIUM 8.6 (L) 10/07/2021 0603  ? ALKPHOS 64 10/05/2021 0545  ? AST 21 10/05/2021 0545  ? AST 16 09/28/2021 1228  ? ALT 18 10/05/2021 0545  ? ALT 14 09/28/2021 1228  ? BILITOT 0.2 (L) 10/05/2021 0545  ? BILITOT 0.3 09/28/2021 1228  ?  ? ?IMPRESSION: ?1. Mild haziness at the pancreatic head which is similar in ?appearance to the prior exam. Correlation with amylase and lipase is ?suggested to exclude pancreatitis. ?2. Essentially stable  right renal mass, compatible with known ?malignancy. Mild hydronephrosis on the right with a 6 mm calculus in ?the proximal right ureter, best seen on coronal image 55. ?3. Complex left renal lesion measures 1.6 cm, slightly smaller ?compared with the prior exam. ?4. Stable metastatic disease including bilateral pulmonary nodules, ?left pleural effusion containing enhancing nodules, left chest wall ?mass, and adrenal glands. ?5. Aortic atherosclerosis. ?  ? ?Impression and Plan: ? ? ?51 year old man with: ? ?1.   Kidney cancer diagnosed in 2022.  He was found to have stage IV clear-cell subtype with adrenal and pulmonary involvement. ? ?The natural course of his disease was reviewed at this time and treatment choices were reiterated.  He continues to have issues with recurrent pancreatitis related to Cabometyx.  CT scan obtained on October 04, 2021 showed overall stable metastatic disease.  After discussion today, we opted to continue with hold treatments and we will update his staging scans in 2 months and a repeat evaluation at that time. ? ?  ? ?2.  Hypercalcemia: Related to malignancy and will recheck calcium level today.  His calcium today is elevated to 12.1 and will receive repeat Zometa infusion.  Complication associated with this treatment including hypocalcemia and osteonecrosis of the jaw were reviewed.  He is agreeable to proceed. ? ?3.  CNS metastasis: He is up-to-date on his staging scans.  Last MRI of the brain done in February 2023 without any evidence of relapsed disease. ? ?4.  Pancreatitis: Continues to be had recurrent issue with the possibility of Cabometyx as the offending agent at this time. ? ? ? ?5.  Goals of care and prognosis: Therapy remains palliative though aggressive measures are warranted at this time. ? ? 6.  Follow-up: Will be in 2 months for repeat follow-up and updating his staging scans. ?  ?30  minutes were dedicated to this encounter.  The time was spent on reviewing imaging studies, disease status update and outlining future plan of care discussion. ? ? ?Zola Button, MD ?4/28/20239:32 AM ? ?

## 2021-11-02 NOTE — Progress Notes (Signed)
Rechecked Pulse. HR still 130. [name] notified. 500cc Normal Saline over 1 hour ordered. ?

## 2021-11-05 ENCOUNTER — Emergency Department (HOSPITAL_COMMUNITY): Payer: Medicaid Other

## 2021-11-05 ENCOUNTER — Encounter (HOSPITAL_COMMUNITY): Payer: Self-pay | Admitting: Emergency Medicine

## 2021-11-05 ENCOUNTER — Emergency Department (HOSPITAL_COMMUNITY)
Admission: EM | Admit: 2021-11-05 | Discharge: 2021-11-05 | Disposition: A | Discharge status: Home / Self Care | Payer: Medicaid Other | Attending: Emergency Medicine | Admitting: Emergency Medicine

## 2021-11-05 DIAGNOSIS — I10 Essential (primary) hypertension: Secondary | ICD-10-CM | POA: Diagnosis not present

## 2021-11-05 DIAGNOSIS — R509 Fever, unspecified: Secondary | ICD-10-CM | POA: Diagnosis not present

## 2021-11-05 DIAGNOSIS — Z79899 Other long term (current) drug therapy: Secondary | ICD-10-CM | POA: Diagnosis not present

## 2021-11-05 DIAGNOSIS — R051 Acute cough: Secondary | ICD-10-CM | POA: Insufficient documentation

## 2021-11-05 DIAGNOSIS — R0602 Shortness of breath: Secondary | ICD-10-CM | POA: Diagnosis not present

## 2021-11-05 DIAGNOSIS — Z20822 Contact with and (suspected) exposure to covid-19: Secondary | ICD-10-CM | POA: Diagnosis not present

## 2021-11-05 DIAGNOSIS — Z85118 Personal history of other malignant neoplasm of bronchus and lung: Secondary | ICD-10-CM | POA: Insufficient documentation

## 2021-11-05 DIAGNOSIS — F17210 Nicotine dependence, cigarettes, uncomplicated: Secondary | ICD-10-CM | POA: Insufficient documentation

## 2021-11-05 DIAGNOSIS — R059 Cough, unspecified: Secondary | ICD-10-CM | POA: Diagnosis present

## 2021-11-05 LAB — RESP PANEL BY RT-PCR (FLU A&B, COVID) ARPGX2
Influenza A by PCR: NEGATIVE
Influenza B by PCR: NEGATIVE
SARS Coronavirus 2 by RT PCR: NEGATIVE

## 2021-11-05 IMAGING — CR DG CHEST 2V
2 series · 2 of 2 positions shown · non-contrast
Comparison: [DATE], [DATE]

CLINICAL DATA: Cough for 2 days, history of metastatic disease

EXAM:
CHEST - 2 VIEW

[w chest pa]
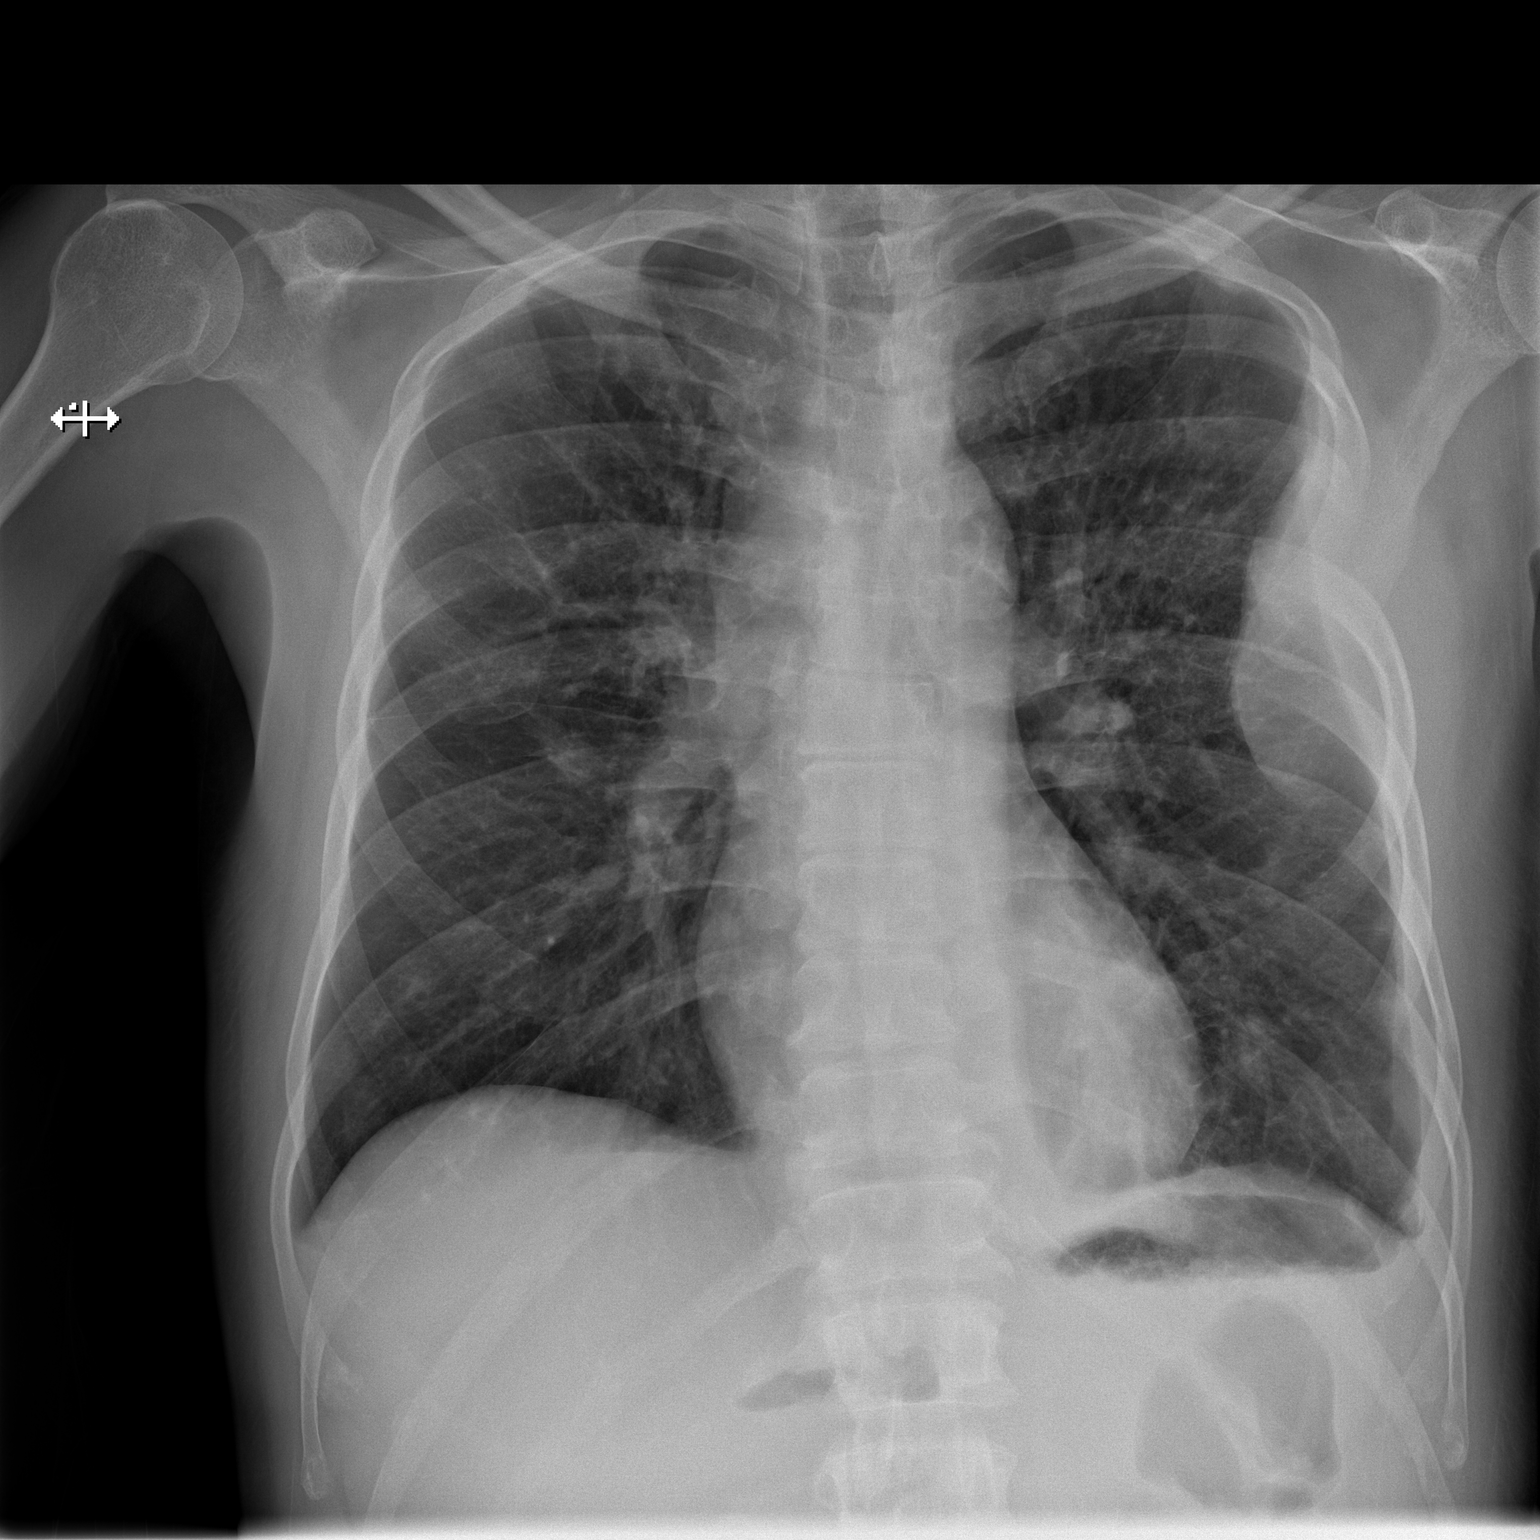

[w chest lat]
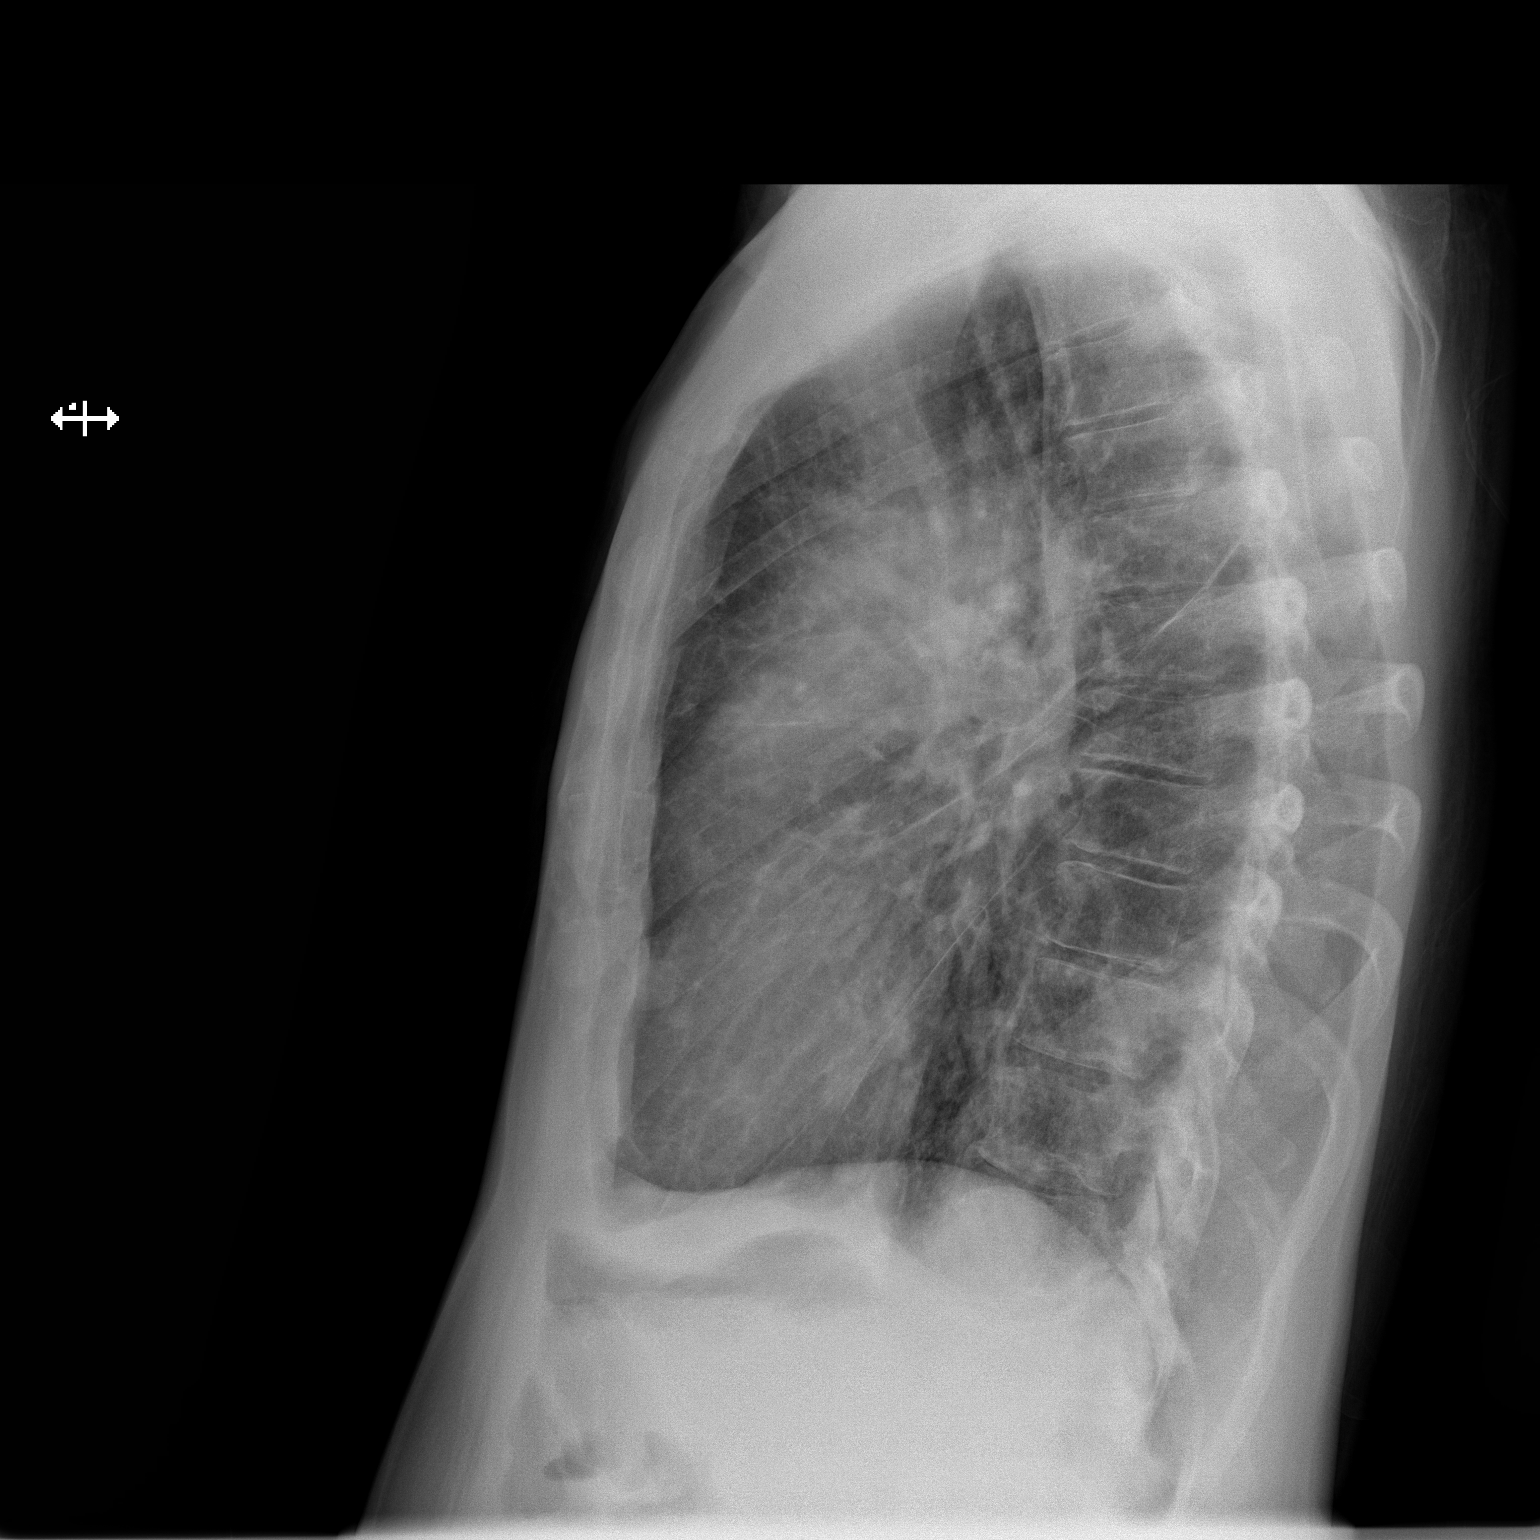

[2 of 2 positions shown; findings below may reference images not displayed]

FINDINGS: Cardiac shadow is within normal limits. The lungs are well aerated
bilaterally. Stable nodular density is noted left lower lobe similar
to that seen on prior CT examination. Left-sided pleural based mass
lesion is noted with erosion of the fifth rib laterally and
anteriorly similar to that seen on prior CT examination consistent
with metastatic disease. Minimal left-sided effusion is seen. No
other bony abnormality is noted.
IMPRESSION: Pleural base mass with erosive changes of the fifth rib consistent
with the known history of metastatic disease stable in appearance
from prior CT examination and plain film.

Small left pleural effusion is seen.

Persistent left basilar nodular changes similar to that seen on
prior CT examination consistent with metastatic disease.

## 2021-11-05 MED ORDER — HYDROCODONE BIT-HOMATROP MBR 5-1.5 MG/5ML PO SOLN
5.0000 mL | Freq: Once | ORAL | Status: AC
Start: 2021-11-05 — End: 2021-11-05
  Administered 2021-11-05: 5 mL via ORAL
  Filled 2021-11-05: qty 5

## 2021-11-05 MED ORDER — HYDROCODONE BIT-HOMATROP MBR 5-1.5 MG/5ML PO SOLN: 5.0000 mL | Freq: Four times a day (QID) | ORAL | 0 refills | Status: DC | PRN

## 2021-11-05 NOTE — ED Provider Notes (Signed)
? ?[facility] DEPT ?Provider Note: Georgena Spurling, MD, [place] ? ?CSN: [id number] ?MRN: [id number] ?ARRIVAL: 11/05/21 at 0256 ?ROOM: [id number]/[id number] ? ? ?CHIEF COMPLAINT  ?Cough ? ? ?HISTORY OF PRESENT ILLNESS  ?11/05/21 3:27 AM ?Dylan Good is a 51 y.o. male with a history of renal cell carcinoma metastatic to the lungs.  He is not currently undergoing chemotherapy.  He is here with a cough for 2 days productive of clear sputum.  The cough is paroxysmal and severe enough at times to cause posttussive emesis.  He has had shortness of breath with this.  He has been using albuterol without relief of the cough.  He describes his symptoms as severe.  He has had a subjective fever but no associated pain.  He does still smoke cigarettes occasionally.  He states he had a negative home COVID test. ? ? ?Past Medical History:  ?Diagnosis Date  ? Cancer, metastatic to lung [facility])   ? Hypertension 07/01/2021  ? Renal cell adenocarcinoma, left ([place])   ? ? ?Past Surgical History:  ?Procedure Laterality Date  ? APPENDECTOMY    ? BIOPSY  07/03/2021  ? Procedure: BIOPSY;  Surgeon: Ronnette Juniper, MD;  Location: [facility];  Service: Gastroenterology;;  ? CHEST TUBE INSERTION Left 02/06/2021  ? Procedure: INSERTION PLEURAL DRAINAGE CATHETER;  Surgeon: Garner Nash, DO;  Location: [facility] ENDOSCOPY;  Service: Pulmonary;  Laterality: Left;  ? CHEST TUBE INSERTION N/A 04/04/2021  ? Procedure: removal of pleurex cath;  Surgeon: Garner Nash, DO;  Location: [facility] ENDOSCOPY;  Service: Pulmonary;  Laterality: N/A;  ? ESOPHAGOGASTRODUODENOSCOPY (EGD) WITH PROPOFOL N/A 07/03/2021  ? Procedure: ESOPHAGOGASTRODUODENOSCOPY (EGD) WITH PROPOFOL;  Surgeon: Ronnette Juniper, MD;  Location: [facility] ENDOSCOPY;  Service: Gastroenterology;  Laterality: N/A;  ? ? ?Family History  ?Problem Relation Age of Onset  ? Lupus Mother   ? Cancer Other   ? ? ?Social History  ? ?Tobacco Use  ? Smoking status: Some Days  ?  Packs/day: 0.20  ?  Types: Cigarettes  ? Smokeless tobacco:  Never  ?Vaping Use  ? Vaping Use: Never used  ?Substance Use Topics  ? Alcohol use: Never  ? Drug use: Never  ? ? ?Prior to Admission medications   ?Medication Sig Start Date End Date Taking? Authorizing Provider  ?acetaminophen (TYLENOL) 325 MG tablet Take 2 tablets (650 mg total) by mouth every 6 (six) hours as needed for mild pain (or Fever >/= 101). 10/07/21  Yes Arrien, [name], MD  ?albuterol (ACCUNEB) 0.63 MG/3ML nebulizer solution Take 1 ampule by nebulization 3 (three) times daily as needed for shortness of breath or wheezing. 09/11/21  Yes [provider]  ?amLODipine (NORVASC) 5 MG tablet TAKE 1 TABLET (5 MG TOTAL) BY MOUTH DAILY. 07/17/21  Yes [name], MD  ?HYDROcodone bit-homatropine (HYCODAN) 5-1.5 MG/5ML syrup Take 5 mLs by mouth every 6 (six) hours as needed for cough. 11/05/21  Yes Addam Goeller, MD  ?magnesium oxide (MAG-OX) 400 (240 Mg) MG tablet Take 1 tablet (400 mg total) by mouth daily. 09/16/21  Yes [name], MD  ?metoprolol succinate (TOPROL-XL) 25 MG 24 hr tablet Take 25 mg by mouth daily. 09/20/21  Yes [provider]  ?ondansetron (ZOFRAN) 4 MG tablet Take 1 tablet (4 mg total) by mouth every 6 (six) hours as needed for nausea. 09/16/21  Yes [name], MD  ?polyethylene glycol (MIRALAX / GLYCOLAX) 17 g packet Take 17 g by mouth daily as needed for mild constipation.  09/16/21  Yes [name], MD  ?tamsulosin (FLOMAX) 0.4 MG CAPS capsule Take 1 capsule (0.4 mg total) by mouth daily. 10/08/21 11/07/21 Yes Arrien, [name], MD  ?VENTOLIN HFA 108 [phone] Base) MCG/ACT inhaler Inhale 2 puffs into the lungs every 4 (four) hours as needed for shortness of breath or wheezing. 09/11/21  Yes [provider]  ?pantoprazole (PROTONIX) 40 MG tablet Take 1 tablet (40 mg total) by mouth daily. 08/14/21   Eugenie Filler, MD  ? ? ?Allergies ?Patient has no known allergies. ? ? ?REVIEW OF SYSTEMS  ?Negative except as noted here or in the History of Present  Illness. ? ? ?PHYSICAL EXAMINATION  ?Initial Vital Signs ?Blood pressure 127/74, pulse 96, temperature 98.2 ?F (36.8 ?C), temperature source Oral, resp. rate 18, SpO2 95 %. ? ?Examination ?General: Well-developed, thin male in no acute distress; appearance consistent with age of record ?HENT: normocephalic; atraumatic ?Eyes: Normal appearance ?Neck: supple ?Heart: regular rate and rhythm ?Lungs: Faint expiratory wheezes left base; mildly decreased breath sounds on the right ?Abdomen: soft; nondistended; nontender; bowel sounds present ?Extremities: No deformity; full range of motion; pulses normal ?Neurologic: Awake, alert and oriented; motor function intact in all extremities and symmetric; no facial droop ?Skin: Warm and dry ?Psychiatric: Normal mood and affect ? ? ?RESULTS  ?Summary of this visit's results, reviewed and interpreted by myself: ? ? EKG Interpretation ? ?Date/Time:    ?Ventricular Rate:    ?PR Interval:    ?QRS Duration:   ?QT Interval:    ?QTC Calculation:   ?R Axis:     ?Text Interpretation:   ?  ? ?  ? ?Laboratory Studies: ?Results for orders placed or performed during the hospital encounter of 11/05/21 (from the past 24 hour(s))  ?Resp Panel by RT-PCR (Flu A&B, Covid) Nasopharyngeal Swab     Status: None  ? Collection Time: 11/05/21  3:45 AM  ? Specimen: Nasopharyngeal Swab; Nasopharyngeal(NP) swabs in vial transport medium  ?Result Value Ref Range  ? SARS Coronavirus 2 by RT PCR NEGATIVE NEGATIVE  ? Influenza A by PCR NEGATIVE NEGATIVE  ? Influenza B by PCR NEGATIVE NEGATIVE  ? ?Imaging Studies: ?DG Chest 2 View ? ?Result Date: 11/05/2021 ?CLINICAL DATA:  Cough for 2 days, history of metastatic disease EXAM: CHEST - 2 VIEW COMPARISON:  09/14/2021, 08/28/2021 FINDINGS: Cardiac shadow is within normal limits. The lungs are well aerated bilaterally. Stable nodular density is noted left lower lobe similar to that seen on prior CT examination. Left-sided pleural based mass lesion is noted with erosion  of the fifth rib laterally and anteriorly similar to that seen on prior CT examination consistent with metastatic disease. Minimal left-sided effusion is seen. No other bony abnormality is noted. IMPRESSION: Pleural base mass with erosive changes of the fifth rib consistent with the known history of metastatic disease stable in appearance from prior CT examination and plain film. Small left pleural effusion is seen. Persistent left basilar nodular changes similar to that seen on prior CT examination consistent with metastatic disease. Electronically Signed   By: Inez Catalina M.D.   On: 11/05/2021 03:53   ? ?ED COURSE and MDM  ?Nursing notes, initial and subsequent vitals signs, including pulse oximetry, reviewed and interpreted by myself. ? ?Vitals:  ? 11/05/21 0303  ?BP: 127/74  ?Pulse: 96  ?Resp: 18  ?Temp: 98.2 ?F (36.8 ?C)  ?TempSrc: Oral  ?SpO2: 95%  ? ?Medications  ?HYDROcodone bit-homatropine (HYCODAN) 5-1.5 MG/5ML syrup 5 mL (5 mLs  Oral Given 11/05/21 0349)  ? ?Patient negative for COVID and influenza.  Chest x-ray shows no infiltrate and I do not believe antibiotics are indicated at this time.  He would like a prescription for Hycodan but does not need refills of his albuterol. ? ? ?PROCEDURES  ?Procedures ? ? ?ED DIAGNOSES  ? ?  ICD-10-CM   ?1. Acute cough  R05.1   ?  ? ? ? ?  ?Annelise Mccoy, MD ?11/05/21 0501 ? ?

## 2021-11-05 NOTE — ED Triage Notes (Addendum)
Pt reports cough for 2 days. Clear phlegm. Reports a hx of kidney cancer but states that they took him off of his treatment. Denies pain, fever, or wheezing. Smokes 2-3 cigarettes daily.  ?

## 2021-11-08 DIAGNOSIS — F17200 Nicotine dependence, unspecified, uncomplicated: Secondary | ICD-10-CM | POA: Insufficient documentation

## 2021-11-15 ENCOUNTER — Inpatient Hospital Stay (HOSPITAL_COMMUNITY)
Admission: EM | Admit: 2021-11-15 | Discharge: 2021-11-22 | DRG: 841 | Disposition: A | Discharge status: Home / Self Care | Payer: Medicaid Other | Attending: Internal Medicine | Admitting: Internal Medicine

## 2021-11-15 ENCOUNTER — Emergency Department (HOSPITAL_COMMUNITY): Payer: Medicaid Other

## 2021-11-15 ENCOUNTER — Other Ambulatory Visit: Payer: Medicaid Other

## 2021-11-15 ENCOUNTER — Other Ambulatory Visit: Payer: Self-pay

## 2021-11-15 ENCOUNTER — Encounter (HOSPITAL_COMMUNITY): Payer: Self-pay

## 2021-11-15 DIAGNOSIS — F17210 Nicotine dependence, cigarettes, uncomplicated: Secondary | ICD-10-CM | POA: Diagnosis present

## 2021-11-15 DIAGNOSIS — K219 Gastro-esophageal reflux disease without esophagitis: Secondary | ICD-10-CM

## 2021-11-15 DIAGNOSIS — C7802 Secondary malignant neoplasm of left lung: Secondary | ICD-10-CM | POA: Diagnosis present

## 2021-11-15 DIAGNOSIS — K5903 Drug induced constipation: Secondary | ICD-10-CM | POA: Diagnosis present

## 2021-11-15 DIAGNOSIS — Z923 Personal history of irradiation: Secondary | ICD-10-CM

## 2021-11-15 DIAGNOSIS — Z7189 Other specified counseling: Secondary | ICD-10-CM

## 2021-11-15 DIAGNOSIS — Z79899 Other long term (current) drug therapy: Secondary | ICD-10-CM

## 2021-11-15 DIAGNOSIS — C649 Malignant neoplasm of unspecified kidney, except renal pelvis: Secondary | ICD-10-CM | POA: Diagnosis present

## 2021-11-15 DIAGNOSIS — C7931 Secondary malignant neoplasm of brain: Secondary | ICD-10-CM | POA: Diagnosis present

## 2021-11-15 DIAGNOSIS — Z6822 Body mass index (BMI) 22.0-22.9, adult: Secondary | ICD-10-CM

## 2021-11-15 DIAGNOSIS — Z515 Encounter for palliative care: Secondary | ICD-10-CM

## 2021-11-15 DIAGNOSIS — C7800 Secondary malignant neoplasm of unspecified lung: Secondary | ICD-10-CM

## 2021-11-15 DIAGNOSIS — Z832 Family history of diseases of the blood and blood-forming organs and certain disorders involving the immune mechanism: Secondary | ICD-10-CM

## 2021-11-15 DIAGNOSIS — R0600 Dyspnea, unspecified: Secondary | ICD-10-CM | POA: Diagnosis present

## 2021-11-15 DIAGNOSIS — R062 Wheezing: Secondary | ICD-10-CM

## 2021-11-15 DIAGNOSIS — Z7951 Long term (current) use of inhaled steroids: Secondary | ICD-10-CM

## 2021-11-15 DIAGNOSIS — C642 Malignant neoplasm of left kidney, except renal pelvis: Secondary | ICD-10-CM | POA: Diagnosis present

## 2021-11-15 DIAGNOSIS — C781 Secondary malignant neoplasm of mediastinum: Secondary | ICD-10-CM | POA: Diagnosis present

## 2021-11-15 DIAGNOSIS — E440 Moderate protein-calorie malnutrition: Secondary | ICD-10-CM | POA: Diagnosis present

## 2021-11-15 DIAGNOSIS — R079 Chest pain, unspecified: Secondary | ICD-10-CM | POA: Diagnosis not present

## 2021-11-15 DIAGNOSIS — Z8616 Personal history of COVID-19: Secondary | ICD-10-CM

## 2021-11-15 DIAGNOSIS — T402X5A Adverse effect of other opioids, initial encounter: Secondary | ICD-10-CM | POA: Diagnosis present

## 2021-11-15 DIAGNOSIS — R0789 Other chest pain: Secondary | ICD-10-CM

## 2021-11-15 DIAGNOSIS — R0602 Shortness of breath: Secondary | ICD-10-CM

## 2021-11-15 DIAGNOSIS — C771 Secondary and unspecified malignant neoplasm of intrathoracic lymph nodes: Principal | ICD-10-CM | POA: Diagnosis present

## 2021-11-15 DIAGNOSIS — I10 Essential (primary) hypertension: Secondary | ICD-10-CM | POA: Diagnosis present

## 2021-11-15 DIAGNOSIS — G893 Neoplasm related pain (acute) (chronic): Secondary | ICD-10-CM | POA: Diagnosis present

## 2021-11-15 DIAGNOSIS — C7801 Secondary malignant neoplasm of right lung: Secondary | ICD-10-CM | POA: Diagnosis present

## 2021-11-15 HISTORY — DX: Gastro-esophageal reflux disease without esophagitis: K21.9

## 2021-11-15 LAB — CBC WITH DIFFERENTIAL/PLATELET
Abs Immature Granulocytes: 0.06 10*3/uL (ref 0.00–0.07)
Basophils Absolute: 0 10*3/uL (ref 0.0–0.1)
Basophils Relative: 1 %
Eosinophils Absolute: 0.2 10*3/uL (ref 0.0–0.5)
Eosinophils Relative: 3 %
HCT: 37.1 % — ABNORMAL LOW (ref 39.0–52.0)
Hemoglobin: 12.1 g/dL — ABNORMAL LOW (ref 13.0–17.0)
Immature Granulocytes: 1 %
Lymphocytes Relative: 22 %
Lymphs Abs: 1.4 10*3/uL (ref 0.7–4.0)
MCH: 26 pg (ref 26.0–34.0)
MCHC: 32.6 g/dL (ref 30.0–36.0)
MCV: 79.8 fL — ABNORMAL LOW (ref 80.0–100.0)
Monocytes Absolute: 1 10*3/uL (ref 0.1–1.0)
Monocytes Relative: 15 %
Neutro Abs: 3.9 10*3/uL (ref 1.7–7.7)
Neutrophils Relative %: 58 %
Platelets: 307 10*3/uL (ref 150–400)
RBC: 4.65 MIL/uL (ref 4.22–5.81)
RDW: 16.6 % — ABNORMAL HIGH (ref 11.5–15.5)
WBC: 6.6 10*3/uL (ref 4.0–10.5)
nRBC: 0 % (ref 0.0–0.2)

## 2021-11-15 LAB — COMPREHENSIVE METABOLIC PANEL
ALT: 13 U/L (ref 0–44)
AST: 14 U/L — ABNORMAL LOW (ref 15–41)
Albumin: 3.5 g/dL (ref 3.5–5.0)
Alkaline Phosphatase: 57 U/L (ref 38–126)
Anion gap: 7 (ref 5–15)
BUN: 10 mg/dL (ref 6–20)
CO2: 22 mmol/L (ref 22–32)
Calcium: 12.7 mg/dL — ABNORMAL HIGH (ref 8.9–10.3)
Chloride: 107 mmol/L (ref 98–111)
Creatinine, Ser: 0.71 mg/dL (ref 0.61–1.24)
GFR, Estimated: >60 mL/min (ref >60)
Glucose, Bld: 123 mg/dL — ABNORMAL HIGH (ref 70–99)
Potassium: 3.7 mmol/L (ref 3.5–5.1)
Sodium: 136 mmol/L (ref 135–145)
Total Bilirubin: 0.6 mg/dL (ref 0.3–1.2)
Total Protein: 7.2 g/dL (ref 6.5–8.1)

## 2021-11-15 LAB — RESP PANEL BY RT-PCR (FLU A&B, COVID) ARPGX2
Influenza A by PCR: NEGATIVE
Influenza B by PCR: NEGATIVE
SARS Coronavirus 2 by RT PCR: NEGATIVE

## 2021-11-15 LAB — TROPONIN I (HIGH SENSITIVITY)
Troponin I (High Sensitivity): 3 ng/L (ref <18)
Troponin I (High Sensitivity): 3 ng/L (ref <18)

## 2021-11-15 IMAGING — DX DG CHEST 1V PORT
1 series · 1 of 1 positions shown · non-contrast
Comparison: Ten days ago

CLINICAL DATA: Cough

EXAM:
PORTABLE CHEST 1 VIEW

[chest ap]
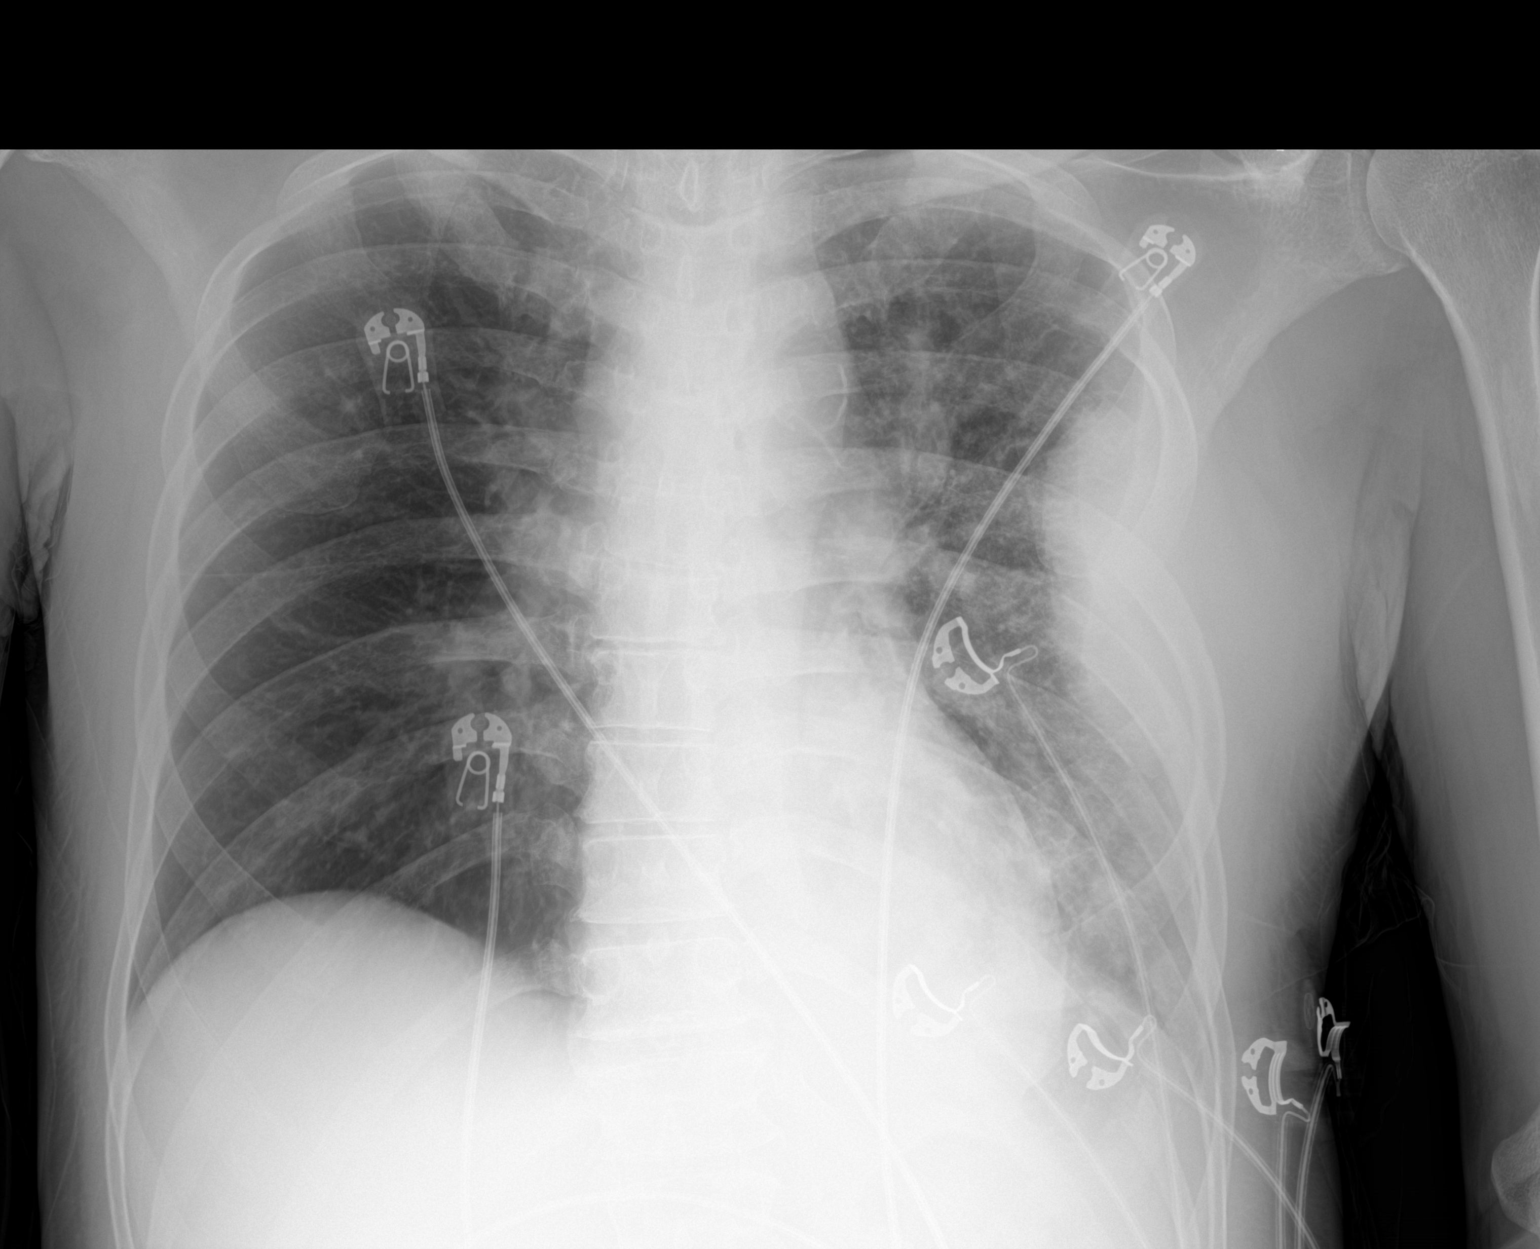

[1 of 1 positions shown; findings below may reference images not displayed]

FINDINGS: Worsening aeration on the left where there is pulmonary nodularity
and pleural fluid by recent CT. Mass of the left chest wall with
lateral fifth rib destruction. Upper mediastinal adenopathy. Normal
heart size.
IMPRESSION: Thoracic metastatic disease with worsening aeration at the left base
when compared to radiograph 10 days ago, possible increase of left
pleural effusion

## 2021-11-15 IMAGING — CT CT ANGIO CHEST
2 of 6 series · 18 of 36 positions shown · IV contrast (OMNIPAQUE 350)
Comparison: [DATE]

CLINICAL DATA: Cough and shortness of breath with left chest pain.
Pulmonary embolism suspected.

EXAM:
CT ANGIOGRAPHY CHEST WITH CONTRAST
TECHNIQUE: Multidetector CT imaging of the chest was performed using the
standard protocol during bolus administration of intravenous
contrast. Multiplanar CT image reconstructions and MIPs were
obtained to evaluate the vascular anatomy.

[Series 5: thins · axial · 0.72mm/px · z∈[-84,+184]mm · 17 of 302 slices shown]
[im 17/302  lung]
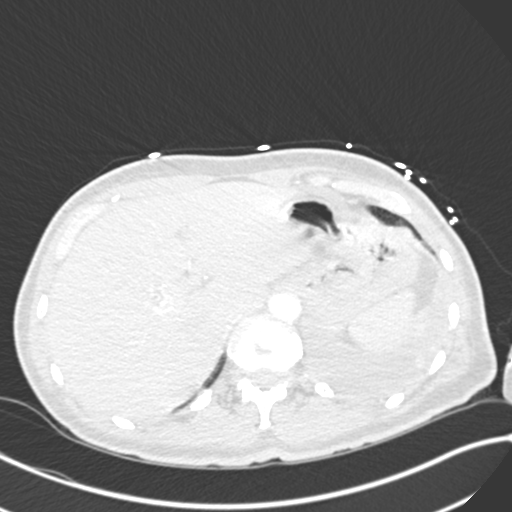
[im 34/302  mediastinal]
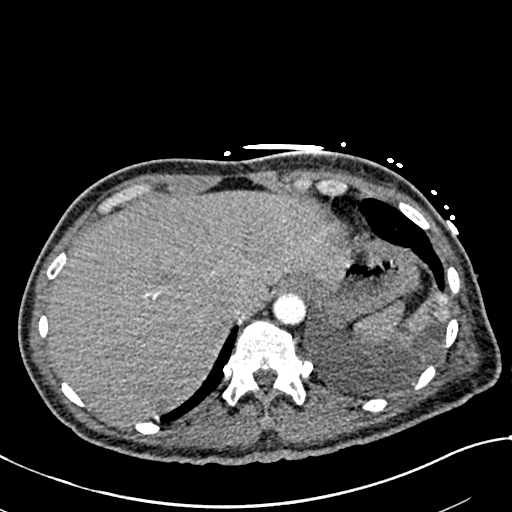
[im 51/302  lung]
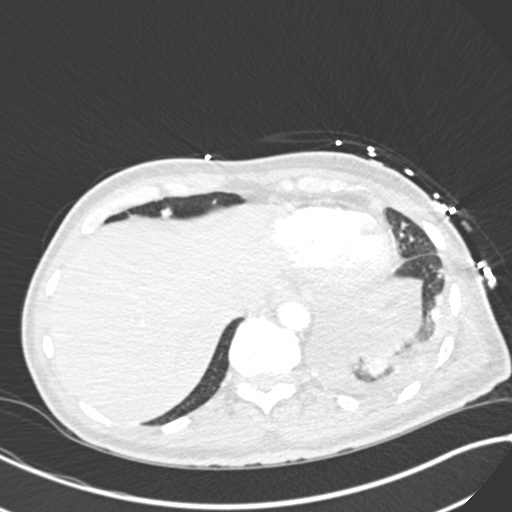
[im 67/302  mediastinal]
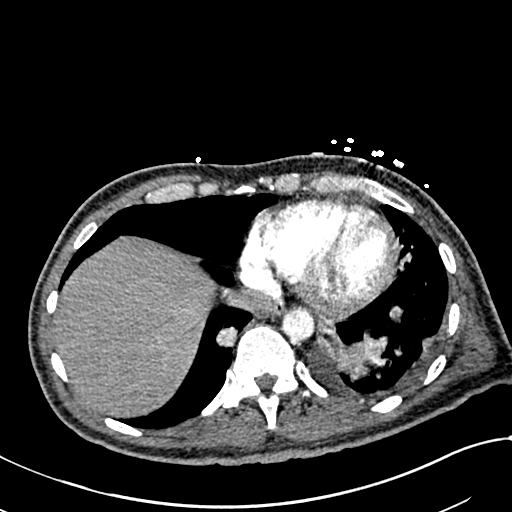
[im 84/302  lung]
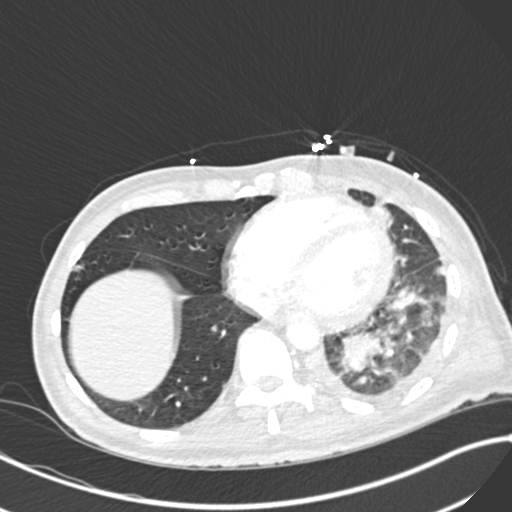
[im 101/302  mediastinal]
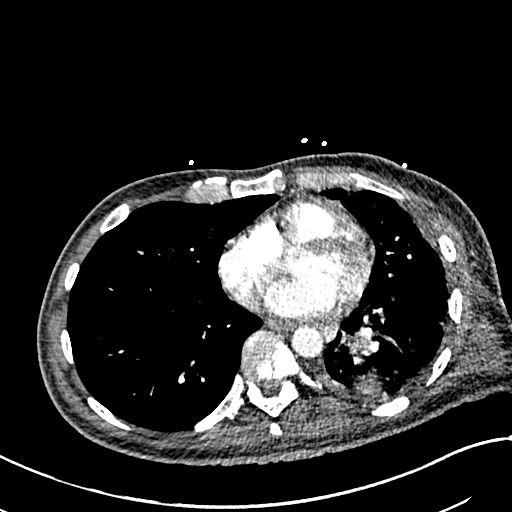
[im 118/302  lung]
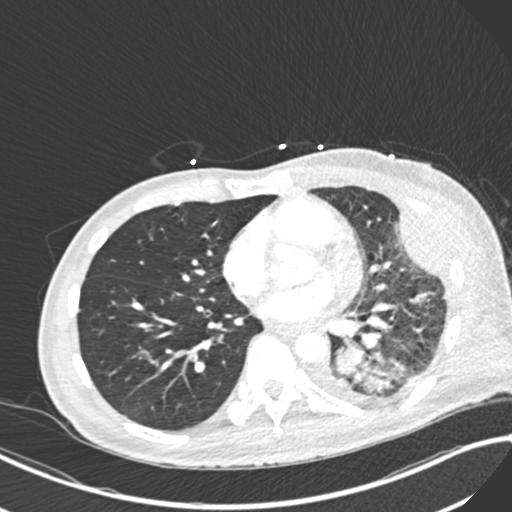
[im 134/302  mediastinal]
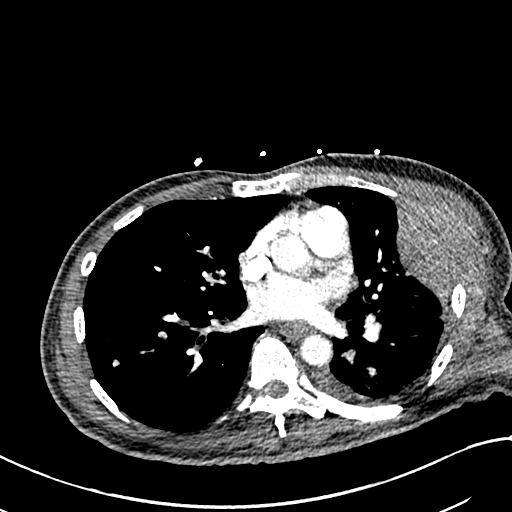
[im 151/302  lung]
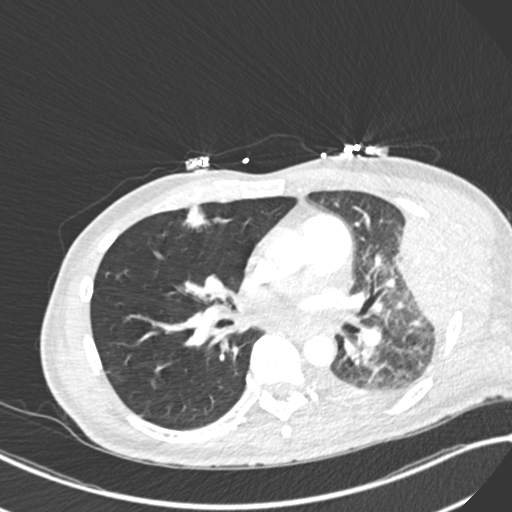
[im 168/302  mediastinal]
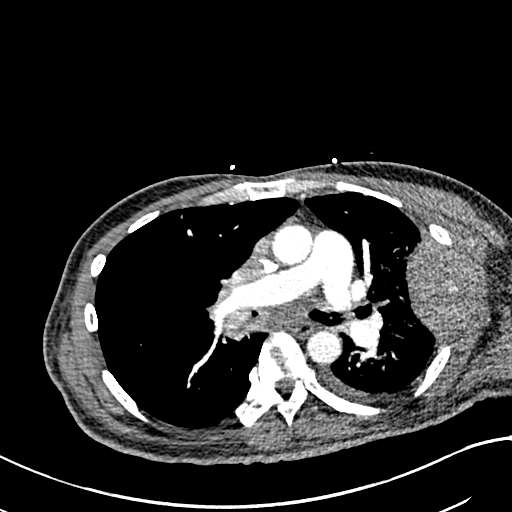
[im 184/302  lung]
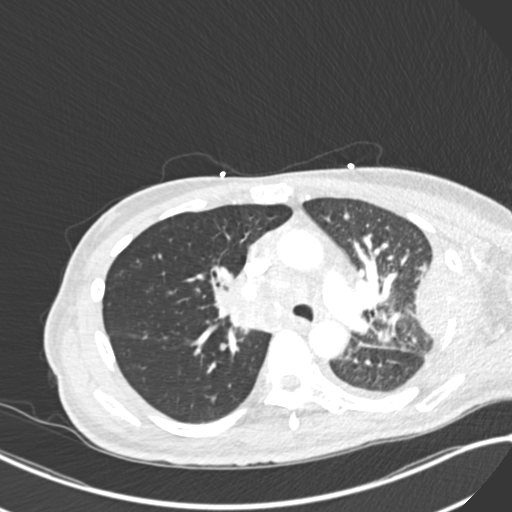
[im 201/302  mediastinal]
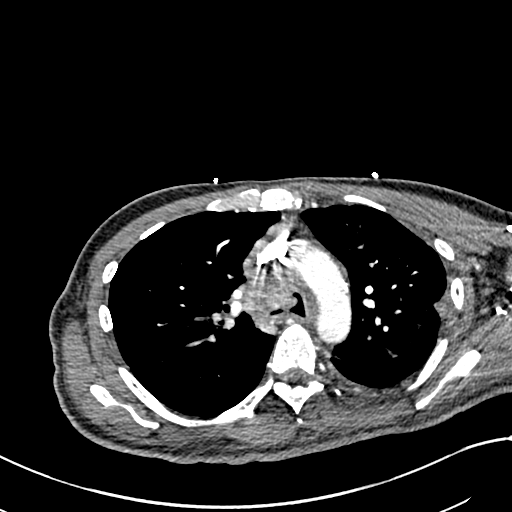
[im 218/302  lung]
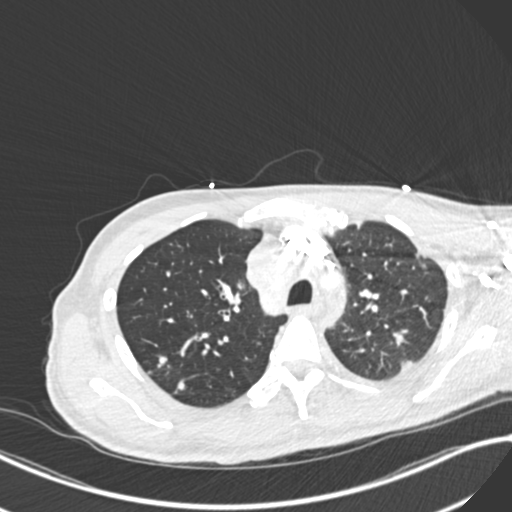
[im 235/302  mediastinal]
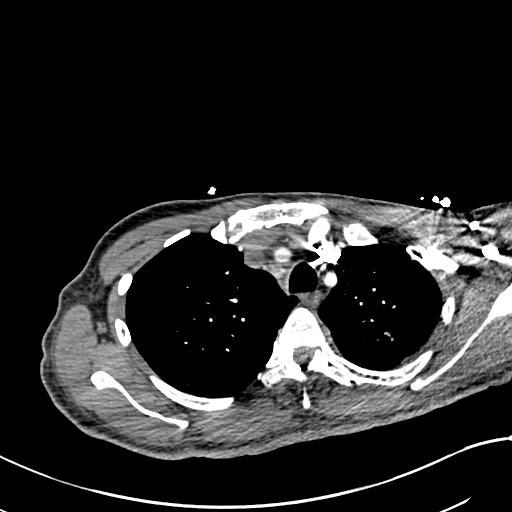
[im 251/302  lung]
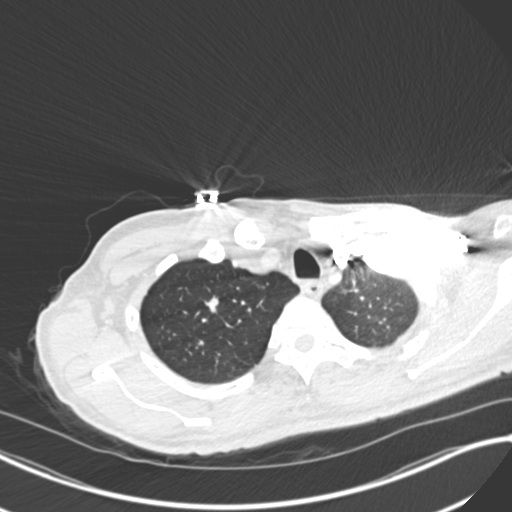
[im 268/302  mediastinal]
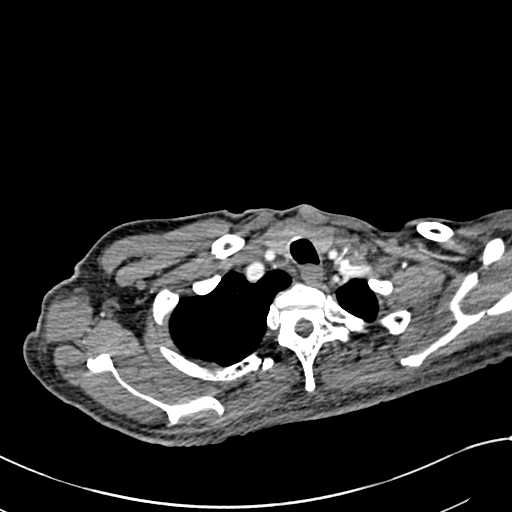
[im 285/302  lung]
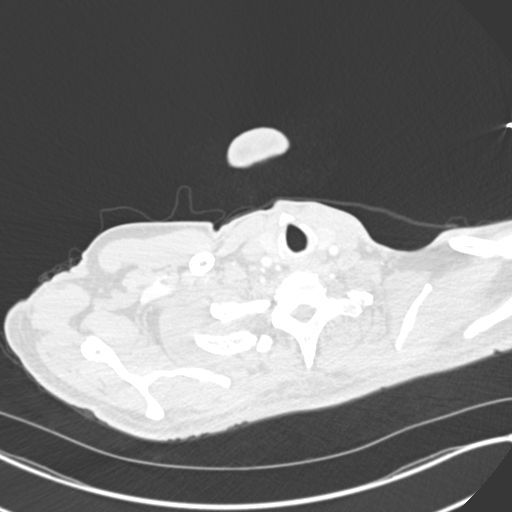

[Series 7: coronal mpr · coronal · 0.61mm/px · 1 of 110 slices shown]
[im 55/110  mediastinal]
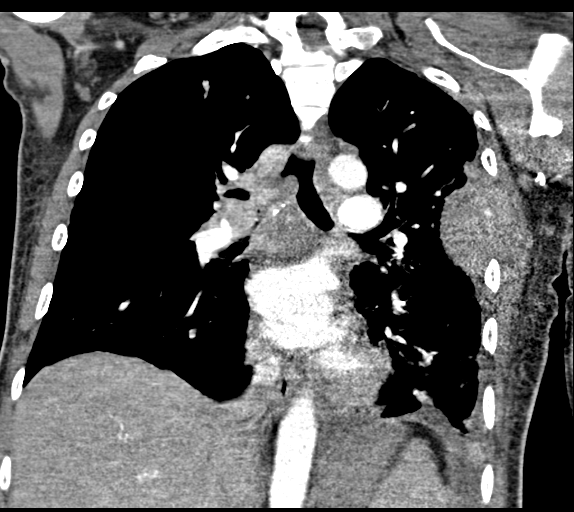

[18 of 36 positions shown; findings below may reference images not displayed]

RADIATION DOSE REDUCTION: This exam was performed according to the
departmental dose-optimization program which includes automated
exposure control, adjustment of the mA and/or kV according to
patient size and/or use of iterative reconstruction technique.

CONTRAST:  100mL OMNIPAQUE IOHEXOL 350 MG/ML SOLN
FINDINGS: Cardiovascular: Normal heart size. No pericardial effusion.
Satisfactory opacification of the pulmonary arteries without filling
defect. No acute aortic finding.

Mediastinum/Nodes: Mediastinal adenopathy, especially in the pre and
right paratracheal region, progressed with lower tracheal and right
mainstem bronchus invasion causing right mainstem bronchus
obstruction. Narrowing of the SVC which remains patent.

Lungs/Pleura: Widespread pulmonary metastatic disease with
generalized increase size of the numerous pulmonary nodules.
Dominant nodule on the right is along the anterior aspect of the
minor fissure and measures 2.2 cm, a few mm larger than before. Mass
in the left lower lobe measuring 4.5 cm, definitely increased from
before. A left pleural effusion remains small volume with nodularity
along the pleura especially at the posterior costophrenic sulcus.

Upper Abdomen: No acute finding

Musculoskeletal: Left fifth rib metastasis laterally with increased
size. The mass measures up to 6.3 cm in thickness on coronal
reformats, previously 4.4 cm. Increased size of L1 upper body
lucency, presumed metastasis.

Review of the MIP images confirms the above findings.
IMPRESSION: 1. Extensive thoracic metastatic disease which has progressed from
[DATE]. Mediastinal adenopathy has invaded the right mainstem
bronchus with obstruction.
2. Negative for pulmonary embolism.

## 2021-11-15 MED ORDER — HYDROCODONE BIT-HOMATROP MBR 5-1.5 MG/5ML PO SOLN
5.0000 mL | Freq: Four times a day (QID) | ORAL | Status: DC | PRN
  Administered 2021-11-15 – 2021-11-17 (×6): 5 mL via ORAL
  Filled 2021-11-15 (×7): qty 5

## 2021-11-15 MED ORDER — ALBUTEROL SULFATE (2.5 MG/3ML) 0.083% IN NEBU: 2.5000 mg | INHALATION_SOLUTION | RESPIRATORY_TRACT | Status: DC | PRN

## 2021-11-15 MED ORDER — BENZONATATE 100 MG PO CAPS
200.0000 mg | ORAL_CAPSULE | Freq: Once | ORAL | Status: AC
  Administered 2021-11-15: 200 mg via ORAL
  Filled 2021-11-15: qty 2

## 2021-11-15 MED ORDER — PREDNISONE 20 MG PO TABS
40.0000 mg | ORAL_TABLET | Freq: Every day | ORAL | Status: AC
  Administered 2021-11-16 – 2021-11-19 (×4): 40 mg via ORAL
  Filled 2021-11-15 (×4): qty 2

## 2021-11-15 MED ORDER — ONDANSETRON HCL 4 MG PO TABS
4.0000 mg | ORAL_TABLET | Freq: Four times a day (QID) | ORAL | Status: DC | PRN
Start: 2021-11-15 — End: 2021-11-22

## 2021-11-15 MED ORDER — METOPROLOL SUCCINATE ER 25 MG PO TB24
25.0000 mg | ORAL_TABLET | Freq: Every day | ORAL | Status: DC
  Administered 2021-11-16 – 2021-11-22 (×7): 25 mg via ORAL
  Filled 2021-11-15 (×7): qty 1

## 2021-11-15 MED ORDER — ONDANSETRON HCL 4 MG/2ML IJ SOLN: 4.0000 mg | Freq: Four times a day (QID) | INTRAMUSCULAR | Status: DC | PRN

## 2021-11-15 MED ORDER — SODIUM CHLORIDE 0.9 % IV SOLN: Freq: Once | INTRAVENOUS | Status: AC

## 2021-11-15 MED ORDER — IPRATROPIUM-ALBUTEROL 0.5-2.5 (3) MG/3ML IN SOLN
3.0000 mL | Freq: Once | RESPIRATORY_TRACT | Status: AC
  Administered 2021-11-15: 3 mL via RESPIRATORY_TRACT
  Filled 2021-11-15: qty 3

## 2021-11-15 MED ORDER — HYDROCODONE-ACETAMINOPHEN 5-325 MG PO TABS
1.0000 | ORAL_TABLET | ORAL | Status: DC | PRN
  Administered 2021-11-16: 2 via ORAL
  Filled 2021-11-15: qty 2

## 2021-11-15 MED ORDER — FENTANYL CITRATE PF 50 MCG/ML IJ SOSY
50.0000 ug | PREFILLED_SYRINGE | Freq: Once | INTRAMUSCULAR | Status: AC
  Administered 2021-11-15: 50 ug via INTRAVENOUS
  Filled 2021-11-15: qty 1

## 2021-11-15 MED ORDER — ACETAMINOPHEN 325 MG PO TABS: 650.0000 mg | ORAL_TABLET | Freq: Four times a day (QID) | ORAL | Status: DC | PRN

## 2021-11-15 MED ORDER — SODIUM CHLORIDE (PF) 0.9 % IJ SOLN
INTRAMUSCULAR | Status: AC
Start: 2021-11-15 — End: 2021-11-15
  Filled 2021-11-15: qty 50

## 2021-11-15 MED ORDER — MORPHINE SULFATE (PF) 4 MG/ML IV SOLN
4.0000 mg | INTRAVENOUS | Status: DC | PRN
  Administered 2021-11-15 – 2021-11-16 (×5): 4 mg via INTRAVENOUS
  Filled 2021-11-15 (×5): qty 1

## 2021-11-15 MED ORDER — DEXAMETHASONE SODIUM PHOSPHATE 10 MG/ML IJ SOLN
8.0000 mg | Freq: Once | INTRAMUSCULAR | Status: AC
  Administered 2021-11-15: 8 mg via INTRAVENOUS
  Filled 2021-11-15: qty 1

## 2021-11-15 MED ORDER — PANTOPRAZOLE SODIUM 40 MG PO TBEC: 40.0000 mg | DELAYED_RELEASE_TABLET | Freq: Every day | ORAL | Status: DC | PRN

## 2021-11-15 MED ORDER — IPRATROPIUM-ALBUTEROL 0.5-2.5 (3) MG/3ML IN SOLN
3.0000 mL | Freq: Four times a day (QID) | RESPIRATORY_TRACT | Status: DC
  Administered 2021-11-15 (×2): 3 mL via RESPIRATORY_TRACT
  Filled 2021-11-15 (×2): qty 3

## 2021-11-15 MED ORDER — IPRATROPIUM-ALBUTEROL 0.5-2.5 (3) MG/3ML IN SOLN
3.0000 mL | Freq: Three times a day (TID) | RESPIRATORY_TRACT | Status: DC
  Administered 2021-11-16 – 2021-11-22 (×20): 3 mL via RESPIRATORY_TRACT
  Filled 2021-11-15 (×20): qty 3

## 2021-11-15 MED ORDER — MAGNESIUM SULFATE 2 GM/50ML IV SOLN
2.0000 g | Freq: Once | INTRAVENOUS | Status: AC
  Administered 2021-11-15: 2 g via INTRAVENOUS
  Filled 2021-11-15: qty 50

## 2021-11-15 MED ORDER — METHYLPREDNISOLONE SODIUM SUCC 125 MG IJ SOLR
60.0000 mg | Freq: Two times a day (BID) | INTRAMUSCULAR | Status: AC
  Administered 2021-11-15 – 2021-11-16 (×2): 60 mg via INTRAVENOUS
  Filled 2021-11-15 (×2): qty 2

## 2021-11-15 MED ORDER — AMLODIPINE BESYLATE 5 MG PO TABS
5.0000 mg | ORAL_TABLET | Freq: Every day | ORAL | Status: DC
  Administered 2021-11-15 – 2021-11-22 (×8): 5 mg via ORAL
  Filled 2021-11-15 (×8): qty 1

## 2021-11-15 MED ORDER — IOHEXOL 350 MG/ML SOLN
100.0000 mL | Freq: Once | INTRAVENOUS | Status: AC | PRN
  Administered 2021-11-15: 100 mL via INTRAVENOUS

## 2021-11-15 MED ORDER — ACETAMINOPHEN 650 MG RE SUPP: 650.0000 mg | Freq: Four times a day (QID) | RECTAL | Status: DC | PRN

## 2021-11-15 NOTE — H&P (Signed)
?History and Physical  ? ? ?Patient: Dylan Good:[id number] DOB: Jun 22, 1971 ?DOA: 11/15/2021 ?DOS: the patient was seen and examined on 11/15/2021 ?PCP: Pcp, No  ?Patient coming from: Home ? ?Chief Complaint:  ?Chief Complaint  ?Patient presents with  ? Cough  ? Shortness of Breath  ? Chest Pain  ? ?HPI: Dylan Good is a 51 y.o. male with medical history significant of RCC w/ pulm mets, HTN, GERD. Presenting with dyspnea. Started at least 2 weeks ago. He made a visit to the ED at the beginning of the month. He was given nebs and steroids. He felt like he improved, so he went home. Since being home, he feels increasingly short of breath and he has an increased cough. He has used his inhalers and cough meds, but they have not help. Last night he had an acute worsening of symptoms to include dull left chest/axillary pain. He became concerned and came to the ED for evaluation. He denies any other aggravating or alleviating factors.   ? ?Review of Systems: As mentioned in the history of present illness. All other systems reviewed and are negative. ?Past Medical History:  ?Diagnosis Date  ? Cancer, metastatic to lung [facility])   ? Hypertension 07/01/2021  ? Renal cell adenocarcinoma, left ([place])   ? ?Past Surgical History:  ?Procedure Laterality Date  ? APPENDECTOMY    ? BIOPSY  07/03/2021  ? Procedure: BIOPSY;  Surgeon: Ronnette Juniper, MD;  Location: [facility];  Service: Gastroenterology;;  ? CHEST TUBE INSERTION Left 02/06/2021  ? Procedure: INSERTION PLEURAL DRAINAGE CATHETER;  Surgeon: Garner Nash, DO;  Location: [facility];  Service: Pulmonary;  Laterality: Left;  ? CHEST TUBE INSERTION N/A 04/04/2021  ? Procedure: removal of pleurex cath;  Surgeon: Garner Nash, DO;  Location: [facility];  Service: Pulmonary;  Laterality: N/A;  ? ESOPHAGOGASTRODUODENOSCOPY (EGD) WITH PROPOFOL N/A 07/03/2021  ? Procedure: ESOPHAGOGASTRODUODENOSCOPY (EGD) WITH PROPOFOL;  Surgeon: Ronnette Juniper, MD;  Location: [facility] ENDOSCOPY;   Service: Gastroenterology;  Laterality: N/A;  ? ?Social History:  reports that he has been smoking cigarettes. He has been smoking an average of .2 packs per day. He has never used smokeless tobacco. He reports that he does not drink alcohol and does not use drugs. ? ?No Known Allergies ? ?Family History  ?Problem Relation Age of Onset  ? Lupus Mother   ? Cancer Other   ? ? ?Prior to Admission medications   ?Medication Sig Start Date End Date Taking? Authorizing Provider  ?acetaminophen (TYLENOL) 325 MG tablet Take 2 tablets (650 mg total) by mouth every 6 (six) hours as needed for mild pain (or Fever >/= 101). 10/07/21  Yes Arrien, [name], MD  ?albuterol (ACCUNEB) 0.63 MG/3ML nebulizer solution Take 1 ampule by nebulization 3 (three) times daily as needed for shortness of breath or wheezing. 09/11/21  Yes [provider]  ?amLODipine (NORVASC) 5 MG tablet TAKE 1 TABLET (5 MG TOTAL) BY MOUTH DAILY. 07/17/21  Yes [name], MD  ?HYDROcodone bit-homatropine (HYCODAN) 5-1.5 MG/5ML syrup Take 5 mLs by mouth every 6 (six) hours as needed for cough. 11/05/21  Yes Molpus, John, MD  ?metoprolol succinate (TOPROL-XL) 25 MG 24 hr tablet Take 25 mg by mouth daily. 09/20/21  Yes [provider]  ?pantoprazole (PROTONIX) 40 MG tablet Take 1 tablet (40 mg total) by mouth daily. ?Patient taking differently: Take 40 mg by mouth daily as needed (for heartburn). 08/14/21  Yes Eugenie Filler, MD  ?polyethylene  glycol (MIRALAX / GLYCOLAX) 17 g packet Take 17 g by mouth daily as needed for mild constipation. 09/16/21  Yes [name], MD  ?Dellis Anes 80-4.5 MCG/ACT inhaler Inhale 2 puffs into the lungs daily as needed (for shortness of breath). 11/12/21  Yes [provider]  ?VENTOLIN HFA 108 (90 Base) MCG/ACT inhaler Inhale 2 puffs into the lungs every 4 (four) hours as needed for shortness of breath or wheezing. 09/11/21  Yes [provider]  ?magnesium oxide (MAG-OX) 400 (240 Mg) MG tablet  Take 1 tablet (400 mg total) by mouth daily. ?Patient not taking: Reported on 11/15/2021 09/16/21   [name], MD  ?ondansetron (ZOFRAN) 4 MG tablet Take 1 tablet (4 mg total) by mouth every 6 (six) hours as needed for nausea. ?Patient not taking: Reported on 11/15/2021 09/16/21   [name], MD  ? ? ?Physical Exam: ?Vitals:  ? 11/15/21 0600 11/15/21 0630 11/15/21 0700 11/15/21 0731  ?BP: 128/73 134/69 118/60 120/72  ?Pulse: 90 91 86 85  ?Resp: (!) 22 (!) 24 19 (!) 21  ?Temp:      ?TempSrc:      ?SpO2: 95% 95% 92% 95%  ? ?General: 51 y.o. male resting in bed in NAD ?Eyes: PERRL, normal sclera ?ENMT: Nares patent w/o discharge, orophaynx clear, dentition normal, ears w/o discharge/lesions/ulcers ?Neck: Supple, trachea midline ?Cardiovascular: RRR, +S1, S2, no m/g/r, equal pulses throughout ?Respiratory: scattered wheeze, slightly increased WOB on RA ?GI: BS+, NDNT, no masses noted, no organomegaly noted ?MSK: No e/c/c ?Neuro: A&O x 3, no focal deficits ?Psyc: Appropriate interaction and affect, calm/cooperative ? ?Data Reviewed: ? ?Na+  136 ?Ca2+ 12.7 ?Trp  3 -> 3 ?WBC  6.6 ?Hgb  12.1 ? ?CTA chest: ?1. Extensive thoracic metastatic disease which has progressed from February 2023. Mediastinal adenopathy has invaded the right mainstem bronchus with obstruction. ?2. Negative for pulmonary embolism. ? ?Assessment and Plan: ?No notes have been filed under this hospital service. ?Service: Hospitalist ?Dyspnea ?Hx of RCC w/ pulmonary mets ?Right mainstem bronchus occlusion ?    - place in obs, tele ?    - continue nebs, steroids ?    - spoke with PCCM; he's currently on RA and maintaining sats; no need for abx right now, they reviewed the imaging and recommended onco/rad onco involvement; if declining after treatment, rec'd full consult ?    - spoke with onco, they are getting rad-onc onboard ? ?Chest pain ?    - likely secondary to cancer; he has a known left rib mass on CT  ?    - trp 3 -> 3 ?    - EKG: sinus tach,  no st elevation ? ?HTN ?    - continue home regimen ? ?GERD ?    - continue home regimen ? ?Hypercalcemia ?    - secondary to cancer? ?    - check ionized Ca2+ ?    - fluids for now ? ? Advance Care Planning:   Code Status: FULL ? ?Consults: PCCM ? ?Family Communication: None at bedside ? ?Severity of Illness: ?The appropriate patient status for this patient is OBSERVATION. Observation status is judged to be reasonable and necessary in order to provide the required intensity of service to ensure the patient's safety. The patient's presenting symptoms, physical exam findings, and initial radiographic and laboratory data in the context of their medical condition is felt to place them at decreased risk for further clinical deterioration. Furthermore, it is anticipated that the  patient will be medically stable for discharge from the hospital within 2 midnights of admission.  ? ?Author: ?Jonnie Finner, DO ?11/15/2021 8:00 AM ? ?For on call review [email or web address].  ?

## 2021-11-15 NOTE — ED Provider Notes (Signed)
Care assumed from [facility], PA-C, at shift change, please see their notes for full documentation of patient's complaint/HPI. Briefly, pt here with complaint of SOB and wheezing x 1 week, worsening overnight. Results so far show chest xray with worsening thoracic metastatic cancer with worsening aeration at the left base when compared to CXR done 10 days ago. CBC without leukocytosis and stable Hgb of 12.1. CMP with calcium 12.7, unchanged from previous. Troponin of 3. Awaiting repeat troponin testing, CTA for further evaluation of cough/SOB and to rule out PE given hx of cancer. Plan is to reevaluate/dispo accordingly. Consider possibility of admission.  ? ?Physical Exam  ?BP 134/69   Pulse 91   Temp 97.7 ?F (36.5 ?C) (Oral)   Resp (!) 24   SpO2 95%  ? ?Physical Exam ?Vitals and nursing note reviewed.  ?Constitutional:   ?   Appearance: He is not ill-appearing.  ?HENT:  ?   Head: Normocephalic and atraumatic.  ?Eyes:  ?   Conjunctiva/sclera: Conjunctivae normal.  ?Cardiovascular:  ?   Rate and Rhythm: Normal rate and regular rhythm.  ?   Pulses: Normal pulses.  ?Pulmonary:  ?   Effort: Tachypnea present.  ?   Breath sounds: Wheezing present.  ?   Comments: Actively coughing. Speaking in shorter sentences. Lungs with wheezing throughout.  ?Skin: ?   General: Skin is warm and dry.  ?   Coloration: Skin is not jaundiced.  ?Neurological:  ?   Mental Status: He is alert.  ? ? ?Procedures  ?Procedures ? ?ED Course / MDM  ?  ?Medical Decision Making ?CTA: ?IMPRESSION:  ?1. Extensive thoracic metastatic disease which has progressed from  ?February 2023. Mediastinal adenopathy has invaded the right mainstem  ?bronchus with obstruction.  ?2. Negative for pulmonary embolism.  ? ?Repeat troponin pending at this time. On reevaluation pt continues to be tachypneic, actively coughing, wheezing throughout, and speaking in shorter sentences. His O2 sats ranged from 91-95% with rest. He had not had much improvement in his  symptoms despite multiple meds including 2 breathing treatments, decadron, tessalon perles. He did have some relief of pain (left sided chest wall) with fentanyl earlier however states it is returning.  ? ?Given worsening findings on CT scan with R mainstem bronchus obstruction and no improvement in symptoms I do feel pt would benefit from admission at this time. Additional pain meds and mag has been ordered for patient. Will consult hospitalist at this time.  ? ?Amount and/or Complexity of Data Reviewed ?Labs: ordered. ?Radiology: ordered. Decision-making details documented in ED Course. ?Discussion of management or test interpretation with external provider(s): Discussed case with [facility] Dr. Marylyn Ishihara who agrees to evaluate patient for admission. Would appreciate pulmonology consult at this time given new findings on CT scan.  ? ?Discussed case with PCCM Dr. Loanne Drilling who will add patient to list and evaluate patient. May require bronch however is high risk for same.  ? ?Risk ?Prescription drug management. ?Decision regarding hospitalization. ? ? ? ? ? ? ?  ?Eustaquio Maize, PA-C ?11/15/21 [id number] ? ?  ?Hayden Rasmussen, MD ?11/15/21 1820 ? ?

## 2021-11-15 NOTE — ED Provider Notes (Signed)
?[facility] DEPT ?Provider Note ? ? ?CSN: [id number] ?Arrival date & time: 11/15/21  0436 ? ?  ? ?History ? ?Chief Complaint  ?Patient presents with  ? Cough  ? Shortness of Breath  ? Chest Pain  ? ? ?Dylan Good is a 51 y.o. male. ? 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  He presents to the emergency department due to wheezing and shortness of breath.  States his symptoms been ongoing for the past week.  States that his symptoms worsened overnight tonight and woke him from sleep.  Reports associated left-sided chest pain.  Also notes a dry cough and nausea.  Denies any vomiting.  He has been using his inhaler with little relief.  ?  ? ?Home Medications ?Prior to Admission medications   ?Medication Sig Start Date End Date Taking? Authorizing Provider  ?acetaminophen (TYLENOL) 325 MG tablet Take 2 tablets (650 mg total) by mouth every 6 (six) hours as needed for mild pain (or Fever >/= 101). 10/07/21  Yes Arrien, [name], MD  ?albuterol (ACCUNEB) 0.63 MG/3ML nebulizer solution Take 1 ampule by nebulization 3 (three) times daily as needed for shortness of breath or wheezing. 09/11/21  Yes [provider]  ?amLODipine (NORVASC) 5 MG tablet TAKE 1 TABLET (5 MG TOTAL) BY MOUTH DAILY. 07/17/21  Yes [name], MD  ?HYDROcodone bit-homatropine (HYCODAN) 5-1.5 MG/5ML syrup Take 5 mLs by mouth every 6 (six) hours as needed for cough. 11/05/21  Yes Molpus, John, MD  ?metoprolol succinate (TOPROL-XL) 25 MG 24 hr tablet Take 25 mg by mouth daily. 09/20/21  Yes [provider]  ?pantoprazole (PROTONIX) 40 MG tablet Take 1 tablet (40 mg total) by mouth daily. ?Patient taking differently: Take 40 mg by mouth daily as needed (for heartburn). 08/14/21  Yes Eugenie Filler, MD  ?polyethylene glycol (MIRALAX / GLYCOLAX) 17 g packet Take 17 g by mouth daily as needed for mild constipation.  09/16/21  Yes [name], MD  ?Dellis Anes 80-4.5 MCG/ACT inhaler Inhale 2 puffs into the lungs daily as needed (for shortness of breath). 11/12/21  Yes [provider]  ?VENTOLIN HFA 108 (90 Base) MCG/ACT inhaler Inhale 2 puffs into the lungs every 4 (four) hours as needed for shortness of breath or wheezing. 09/11/21  Yes [provider]  ?magnesium oxide (MAG-OX) 400 (240 Mg) MG tablet Take 1 tablet (400 mg total) by mouth daily. ?Patient not taking: Reported on 11/15/2021 09/16/21   [name], MD  ?ondansetron (ZOFRAN) 4 MG tablet Take 1 tablet (4 mg total) by mouth every 6 (six) hours as needed for nausea. ?Patient not taking: Reported on 11/15/2021 09/16/21   [name], MD  ?   ? ?Allergies    ?Patient has no known allergies.   ? ?Review of Systems   ?Review of Systems  ?All other systems reviewed and are negative. ?Ten systems reviewed and are negative for acute change, except as noted in the HPI.   ?Physical Exam ?Updated Vital Signs ?BP 128/73   Pulse 90   Temp 97.7 ?F (36.5 ?C) (Oral)   Resp (!) 22   SpO2 95%  ?Physical Exam ?Vitals and nursing note reviewed.  ?Constitutional:   ?   General: He is not in acute distress. ?   Appearance: Normal appearance. He is well-developed. He is not ill-appearing, toxic-appearing or diaphoretic.  ?   Interventions:  He is not intubated. ?HENT:  ?   Head: Normocephalic and atraumatic.  ?   Right Ear: External ear normal.  ?   Left Ear: External ear normal.  ?   Nose: Nose normal.  ?   Mouth/Throat:  ?   Mouth: Mucous membranes are moist.  ?   Pharynx: Oropharynx is clear. No oropharyngeal exudate or posterior oropharyngeal erythema.  ?Eyes:  ?   Extraocular Movements: Extraocular movements intact.  ?Cardiovascular:  ?   Rate and Rhythm: Regular rhythm. Tachycardia present.  ?   Pulses: Normal pulses.  ?   Heart sounds: Normal heart sounds. No murmur heard. ?  No friction rub. No gallop.  ?Pulmonary:  ?   Effort: Pulmonary effort is normal.  Tachypnea present. No accessory muscle usage or respiratory distress. He is not intubated.  ?   Breath sounds: No stridor. Examination of the right-middle field reveals wheezing. Examination of the left-middle field reveals wheezing. Examination of the right-lower field reveals wheezing. Examination of the left-lower field reveals wheezing. Wheezing present. No decreased breath sounds, rhonchi or rales.  ?   Comments: Wheezing noted in the posterior lung fields. Tachypneic. Oxygen saturations at 95% on RA. ?Chest:  ?   Chest wall: Tenderness present.  ?   Comments: Mild TTP noted to the left anterior chest wall. ?Abdominal:  ?   General: Abdomen is flat.  ?   Tenderness: There is no abdominal tenderness.  ?Musculoskeletal:     ?   General: Normal range of motion.  ?   Cervical back: Normal range of motion and neck supple. No tenderness.  ?   Right lower leg: No edema.  ?   Left lower leg: No edema.  ?   Comments: No pedal edema.  ?Skin: ?   General: Skin is warm and dry.  ?Neurological:  ?   General: No focal deficit present.  ?   Mental Status: He is alert and oriented to person, place, and time.  ?Psychiatric:     ?   Mood and Affect: Mood normal.     ?   Behavior: Behavior normal.  ? ?ED Results / Procedures / Treatments   ?Labs ?(all labs ordered are listed, but only abnormal results are displayed) ?Labs Reviewed  ?CBC WITH DIFFERENTIAL/PLATELET - Abnormal; Notable for the following components:  ?    Result Value  ? Hemoglobin 12.1 (*)   ? HCT 37.1 (*)   ? MCV 79.8 (*)   ? RDW 16.6 (*)   ? All other components within normal limits  ?COMPREHENSIVE METABOLIC PANEL - Abnormal; Notable for the following components:  ? Glucose, Bld 123 (*)   ? Calcium 12.7 (*)   ? AST 14 (*)   ? All other components within normal limits  ?TROPONIN I (HIGH SENSITIVITY)  ?TROPONIN I (HIGH SENSITIVITY)  ? ? ?EKG ?None ? ?Radiology ?DG Chest Portable 1 View ? ?Result Date: 11/15/2021 ?CLINICAL DATA:  Cough EXAM: PORTABLE CHEST 1 VIEW  COMPARISON:  Ten days ago FINDINGS: Worsening aeration on the left where there is pulmonary nodularity and pleural fluid by recent CT. Mass of the left chest wall with lateral fifth rib destruction. Upper mediastinal adenopathy. Normal heart size. IMPRESSION: Thoracic metastatic disease with worsening aeration at the left base when compared to radiograph 10 days ago, possible increase of left pleural effusion Electronically Signed   By: Jorje Guild M.D.   On: 11/15/2021 06:03   ? ?Procedures ?Procedures  ? ?Medications Ordered in  ED ?Medications  ?sodium chloride (PF) 0.9 % injection (has no administration in time range)  ?benzonatate (TESSALON) capsule 200 mg (200 mg Oral Given 11/15/21 0523)  ?ipratropium-albuterol (DUONEB) 0.5-2.5 (3) MG/3ML nebulizer solution 3 mL (3 mLs Nebulization Given 11/15/21 0530)  ?dexamethasone (DECADRON) injection 8 mg (8 mg Intravenous Given 11/15/21 0524)  ?ipratropium-albuterol (DUONEB) 0.5-2.5 (3) MG/3ML nebulizer solution 3 mL (3 mLs Nebulization Given 11/15/21 0602)  ?fentaNYL (SUBLIMAZE) injection 50 mcg (50 mcg Intravenous Given 11/15/21 0611)  ?iohexol (OMNIPAQUE) 350 MG/ML injection 100 mL (100 mLs Intravenous Contrast Given 11/15/21 0240)  ? ? ?ED Course/ Medical Decision Making/ A&P ?  ?                        ?Medical Decision Making ?Amount and/or Complexity of Data Reviewed ?Labs: ordered. ?Radiology: ordered. ? ?Risk ?Prescription drug management. ? ?Pt is a 50 y.o. male with a complex medical history who presents to the emergency department due to chest pain and shortness of breath. ? ?Labs: ?CBC with a hemoglobin of 12.1, hematocrit 37.1, MCV of 79.8, RDW of 16.6. ?CMP with glucose of 123, calcium of 12.7, AST of 14. ?Troponin of 3 with a repeat pending. ? ?Imaging: ?Chest x-ray shows thoracic metastatic disease with worsening aeration at the left lung base when compared to radiograph 10 days ago, possible increase of left pleural effusion. ?CT of the chest is  pending. ? ?I, Rayna Sexton, PA-C, personally reviewed and evaluated these images and lab results as part of my medical decision-making. ? ?Patient initially presented tachypneic with oxygen saturations around 95%.  Wh

## 2021-11-15 NOTE — ED Triage Notes (Signed)
Pt reports with cough, SHOB, and chest pain x 1 week. Pt states that he has been using his inhaler with no relief.  ?

## 2021-11-15 NOTE — Progress Notes (Signed)
IP PROGRESS NOTE ? ?Subjective:  ? ?Dylan Good is known to me.  He is a 51 year old man with kidney cancer diagnosed in June 2022.  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 ?  ?  ?Prior Therapy:  ?  ?He is status post thoracentesis completed in June 2022 and repeated on January 02, 2021. ?  ?He is s/p Pleurx catheter insertion in August 2022. ?  ?He is status post stereotactic radiosurgery to the brain completed on January 29, 2021.  He received 20 Gray in 1 fraction. ?  ?He is status post kidney biopsy completed on March 06, 2021.  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 ?  ?Ipilimumab 1 mg/kg with nivolumab 3 mg/kg started on January 15, 2021.  He completed 4 cycles of therapy. ?  ?Nivolumab 480 mg every 4 weeks to start on April 10, 2021.  Therapy discontinued due to progression of disease. ?  ?Current therapy: Cabometyx 40 mg started on May 11, 2021.  Therapy interrupted during frequent hospitalization between December and March 2023.  Therapy discontinued in April 2023. ? ?He presented acutely with complaints of shortness of breath, cough and dyspnea exertion.  CT scan of the chest obtained and showed progression of disease with pulmonary nodules as well as mediastinal adenopathy.  Mediastinal adenopathy is invading the right mainstem bronchus with obstruction. ? ?Objective: ? ?Vital signs in last 24 hours: ?Temp:  [97.7 ?F (36.5 ?C)] 97.7 ?F (36.5 ?C) (05/11 0525) ?Pulse Rate:  [78-96] 78 (05/11 0900) ?Resp:  [18-27] 23 (05/11 0900) ?BP: (111-134)/(60-73) 111/68 (05/11 0900) ?SpO2:  [92 %-95 %] 92 % (05/11 0900) ?Weight change:  ?  ? ?Intake/Output from previous day: ?No intake/output data recorded. ?General: Alert, awake without distress.  Appears chronically ill but comfortable. ?Head: Normocephalic atraumatic. ?Mouth: mucous membranes moist, pharynx normal without lesions ?Eyes: No scleral icterus.  Pupils are equal and round reactive to  light. ?Resp: clear to auscultation bilaterally without rhonchi or wheezes or dullness to percussion. ?Cardio: regular rate and rhythm, S1, S2 normal, no murmur, click, rub or gallop ?GI: soft, non-tender; bowel sounds normal; no masses,  no organomegaly ?Musculoskeletal: No joint deformity or effusion. ?Neurological: No motor, sensory deficits.  Intact deep tendon reflexes. ?Skin: No rashes or lesions. ? ? ? ?Lab Results: ?Recent Labs  ?  11/15/21 ?1856  ?WBC 6.6  ?HGB 12.1*  ?HCT 37.1*  ?PLT 307  ? ? ?BMET ?Recent Labs  ?  11/15/21 ?3149  ?NA 136  ?K 3.7  ?CL 107  ?CO2 22  ?GLUCOSE 123*  ?BUN 10  ?CREATININE 0.71  ?CALCIUM 12.7*  ? ? ?Studies/Results: ?CT Angio Chest PE W and/or Wo Contrast ? ?Result Date: 11/15/2021 ?CLINICAL DATA:  Cough and shortness of breath with left chest pain. Pulmonary embolism suspected. EXAM: CT ANGIOGRAPHY CHEST WITH CONTRAST TECHNIQUE: Multidetector CT imaging of the chest was performed using the standard protocol during bolus administration of intravenous contrast. Multiplanar CT image reconstructions and MIPs were obtained to evaluate the vascular anatomy. RADIATION DOSE REDUCTION: This exam was performed according to the departmental dose-optimization program which includes automated exposure control, adjustment of the mA and/or kV according to patient size and/or use of iterative reconstruction technique. CONTRAST:  162mL OMNIPAQUE IOHEXOL 350 MG/ML SOLN COMPARISON:  08/28/2021 FINDINGS: Cardiovascular: Normal heart size. No pericardial effusion. Satisfactory opacification of the pulmonary arteries without filling defect. No acute aortic finding. Mediastinum/Nodes: Mediastinal adenopathy, especially in the pre and right  paratracheal region, progressed with lower tracheal and right mainstem bronchus invasion causing right mainstem bronchus obstruction. Narrowing of the SVC which remains patent. Lungs/Pleura: Widespread pulmonary metastatic disease with generalized increase size of  the numerous pulmonary nodules. Dominant nodule on the right is along the anterior aspect of the minor fissure and measures 2.2 cm, a few mm larger than before. Mass in the left lower lobe measuring 4.5 cm, definitely increased from before. A left pleural effusion remains small volume with nodularity along the pleura especially at the posterior costophrenic sulcus. Upper Abdomen: No acute finding Musculoskeletal: Left fifth rib metastasis laterally with increased size. The mass measures up to 6.3 cm in thickness on coronal reformats, previously 4.4 cm. Increased size of L1 upper body lucency, presumed metastasis. Review of the MIP images confirms the above findings. IMPRESSION: 1. Extensive thoracic metastatic disease which has progressed from February 2023. Mediastinal adenopathy has invaded the right mainstem bronchus with obstruction. 2. Negative for pulmonary embolism. Electronically Signed   By: Jorje Guild M.D.   On: 11/15/2021 07:14  ? ?DG Chest Portable 1 View ? ?Result Date: 11/15/2021 ?CLINICAL DATA:  Cough EXAM: PORTABLE CHEST 1 VIEW COMPARISON:  Ten days ago FINDINGS: Worsening aeration on the left where there is pulmonary nodularity and pleural fluid by recent CT. Mass of the left chest wall with lateral fifth rib destruction. Upper mediastinal adenopathy. Normal heart size. IMPRESSION: Thoracic metastatic disease with worsening aeration at the left base when compared to radiograph 10 days ago, possible increase of left pleural effusion Electronically Signed   By: Jorje Guild M.D.   On: 11/15/2021 06:03   ? ?Medications: I have reviewed the patient's current medications. ? ?Assessment/Plan: ? ?51 year old with: ? ?1.  Stage IV clear-cell renal cell carcinoma with progressive disease on cabozantinib.  Risks and benefits of alternative systemic therapy were reviewed.  These would include axitinib, Xopenex, and others.  His performance status remains adequate and is reasonable to try different  systemic therapy.  This will be started as an outpatient after his discharge. ? ?2.  Pulmonary considerations: He has extensive thoracic metastasis with possible invasion into the right mainstem bronchus.  I will ask evaluation by radiation oncology whether palliative radiation can offer some relief to his symptoms are predominantly cough and dyspnea exertion. ? ?3.  Prognosis and goals of care: Any therapy is palliative at this time.  His performance status remains reasonable and aggressive measures remains warranted at this time.  He continues to progress on therapy plan's transitioning to hospice could be considered. ? ?4.  Disposition and follow-up: We will arrange follow-up upon his discharge to discuss different salvage therapy. ? ?35  minutes were dedicated to this visit.  50% of the time was face-to-face.  The time was spent on reviewing imaging studies, discussing treatment options, and addressing complications related to his cancer and cancer therapy. ? ? LOS: 0 days  ? ?Zola Button 11/15/2021, 10:37 AM ? ?

## 2021-11-16 ENCOUNTER — Ambulatory Visit
Admit: 2021-11-16 | Discharge: 2021-11-16 | Disposition: A | Discharge status: Home / Self Care | Payer: Medicaid Other | Attending: Radiation Oncology | Admitting: Radiation Oncology

## 2021-11-16 ENCOUNTER — Institutional Professional Consult (permissible substitution): Payer: Medicaid Other | Admitting: Radiation Oncology

## 2021-11-16 ENCOUNTER — Encounter: Payer: Self-pay | Admitting: Radiation Oncology

## 2021-11-16 ENCOUNTER — Ambulatory Visit: Payer: Medicaid Other

## 2021-11-16 ENCOUNTER — Ambulatory Visit
Admission: RE | Admit: 2021-11-16 | Discharge: 2021-11-16 | Disposition: A | Discharge status: Home / Self Care | Payer: Medicaid Other | Source: Ambulatory Visit | Attending: Radiation Oncology | Admitting: Radiation Oncology

## 2021-11-16 ENCOUNTER — Other Ambulatory Visit: Payer: Self-pay

## 2021-11-16 DIAGNOSIS — R0789 Other chest pain: Secondary | ICD-10-CM | POA: Diagnosis not present

## 2021-11-16 DIAGNOSIS — C649 Malignant neoplasm of unspecified kidney, except renal pelvis: Secondary | ICD-10-CM | POA: Diagnosis not present

## 2021-11-16 DIAGNOSIS — Z7189 Other specified counseling: Secondary | ICD-10-CM

## 2021-11-16 DIAGNOSIS — Z7951 Long term (current) use of inhaled steroids: Secondary | ICD-10-CM | POA: Diagnosis not present

## 2021-11-16 DIAGNOSIS — K219 Gastro-esophageal reflux disease without esophagitis: Secondary | ICD-10-CM | POA: Diagnosis present

## 2021-11-16 DIAGNOSIS — C781 Secondary malignant neoplasm of mediastinum: Secondary | ICD-10-CM | POA: Diagnosis present

## 2021-11-16 DIAGNOSIS — C771 Secondary and unspecified malignant neoplasm of intrathoracic lymph nodes: Secondary | ICD-10-CM | POA: Diagnosis present

## 2021-11-16 DIAGNOSIS — C642 Malignant neoplasm of left kidney, except renal pelvis: Secondary | ICD-10-CM | POA: Diagnosis present

## 2021-11-16 DIAGNOSIS — I10 Essential (primary) hypertension: Secondary | ICD-10-CM | POA: Diagnosis present

## 2021-11-16 DIAGNOSIS — R0600 Dyspnea, unspecified: Secondary | ICD-10-CM | POA: Diagnosis not present

## 2021-11-16 DIAGNOSIS — Z8616 Personal history of COVID-19: Secondary | ICD-10-CM | POA: Diagnosis not present

## 2021-11-16 DIAGNOSIS — C7800 Secondary malignant neoplasm of unspecified lung: Secondary | ICD-10-CM | POA: Diagnosis not present

## 2021-11-16 DIAGNOSIS — Z923 Personal history of irradiation: Secondary | ICD-10-CM | POA: Diagnosis not present

## 2021-11-16 DIAGNOSIS — C7802 Secondary malignant neoplasm of left lung: Secondary | ICD-10-CM | POA: Diagnosis present

## 2021-11-16 DIAGNOSIS — C7931 Secondary malignant neoplasm of brain: Secondary | ICD-10-CM | POA: Diagnosis present

## 2021-11-16 DIAGNOSIS — F17210 Nicotine dependence, cigarettes, uncomplicated: Secondary | ICD-10-CM | POA: Diagnosis present

## 2021-11-16 DIAGNOSIS — C641 Malignant neoplasm of right kidney, except renal pelvis: Secondary | ICD-10-CM

## 2021-11-16 DIAGNOSIS — Z79899 Other long term (current) drug therapy: Secondary | ICD-10-CM | POA: Diagnosis not present

## 2021-11-16 DIAGNOSIS — Z85528 Personal history of other malignant neoplasm of kidney: Secondary | ICD-10-CM | POA: Insufficient documentation

## 2021-11-16 DIAGNOSIS — R0602 Shortness of breath: Secondary | ICD-10-CM | POA: Diagnosis present

## 2021-11-16 DIAGNOSIS — C7801 Secondary malignant neoplasm of right lung: Secondary | ICD-10-CM | POA: Diagnosis present

## 2021-11-16 DIAGNOSIS — E440 Moderate protein-calorie malnutrition: Secondary | ICD-10-CM | POA: Diagnosis present

## 2021-11-16 DIAGNOSIS — K5903 Drug induced constipation: Secondary | ICD-10-CM | POA: Diagnosis present

## 2021-11-16 DIAGNOSIS — Z832 Family history of diseases of the blood and blood-forming organs and certain disorders involving the immune mechanism: Secondary | ICD-10-CM | POA: Diagnosis not present

## 2021-11-16 DIAGNOSIS — R059 Cough, unspecified: Secondary | ICD-10-CM | POA: Diagnosis not present

## 2021-11-16 DIAGNOSIS — Z515 Encounter for palliative care: Secondary | ICD-10-CM | POA: Diagnosis not present

## 2021-11-16 DIAGNOSIS — G893 Neoplasm related pain (acute) (chronic): Secondary | ICD-10-CM | POA: Diagnosis present

## 2021-11-16 DIAGNOSIS — T402X5A Adverse effect of other opioids, initial encounter: Secondary | ICD-10-CM | POA: Diagnosis present

## 2021-11-16 DIAGNOSIS — Z6822 Body mass index (BMI) 22.0-22.9, adult: Secondary | ICD-10-CM | POA: Diagnosis not present

## 2021-11-16 LAB — COMPREHENSIVE METABOLIC PANEL
ALT: 12 U/L (ref 0–44)
AST: 13 U/L — ABNORMAL LOW (ref 15–41)
Albumin: 3.4 g/dL — ABNORMAL LOW (ref 3.5–5.0)
Alkaline Phosphatase: 55 U/L (ref 38–126)
Anion gap: 10 (ref 5–15)
BUN: 15 mg/dL (ref 6–20)
CO2: 21 mmol/L — ABNORMAL LOW (ref 22–32)
Calcium: 12 mg/dL — ABNORMAL HIGH (ref 8.9–10.3)
Chloride: 103 mmol/L (ref 98–111)
Creatinine, Ser: 0.7 mg/dL (ref 0.61–1.24)
GFR, Estimated: >60 mL/min (ref >60)
Glucose, Bld: 144 mg/dL — ABNORMAL HIGH (ref 70–99)
Potassium: 4.2 mmol/L (ref 3.5–5.1)
Sodium: 134 mmol/L — ABNORMAL LOW (ref 135–145)
Total Bilirubin: 0.5 mg/dL (ref 0.3–1.2)
Total Protein: 7.3 g/dL (ref 6.5–8.1)

## 2021-11-16 LAB — RAD ONC ARIA SESSION SUMMARY
Course Elapsed Days: 0
Plan Fractions Treated to Date: 1
Plan Prescribed Dose Per Fraction: 4 Gy
Plan Total Fractions Prescribed: 5
Plan Total Prescribed Dose: 20 Gy
Reference Point Dosage Given to Date: 4 Gy
Reference Point Session Dosage Given: 4 Gy
Session Number: 1

## 2021-11-16 LAB — CBC
HCT: 35 % — ABNORMAL LOW (ref 39.0–52.0)
Hemoglobin: 11.1 g/dL — ABNORMAL LOW (ref 13.0–17.0)
MCH: 25.5 pg — ABNORMAL LOW (ref 26.0–34.0)
MCHC: 31.7 g/dL (ref 30.0–36.0)
MCV: 80.3 fL (ref 80.0–100.0)
Platelets: 352 10*3/uL (ref 150–400)
RBC: 4.36 MIL/uL (ref 4.22–5.81)
RDW: 16.9 % — ABNORMAL HIGH (ref 11.5–15.5)
WBC: 7.6 10*3/uL (ref 4.0–10.5)
nRBC: 0 % (ref 0.0–0.2)

## 2021-11-16 LAB — PTH, INTACT AND CALCIUM

## 2021-11-16 MED ORDER — ENSURE ENLIVE PO LIQD: 237.0000 mL | Freq: Two times a day (BID) | ORAL | Status: DC | PRN

## 2021-11-16 MED ORDER — MORPHINE SULFATE (PF) 4 MG/ML IV SOLN
4.0000 mg | INTRAVENOUS | Status: DC | PRN
  Administered 2021-11-16 – 2021-11-17 (×3): 4 mg via INTRAVENOUS
  Filled 2021-11-16 (×3): qty 1

## 2021-11-16 MED ORDER — OXYCODONE HCL 5 MG PO TABS
5.0000 mg | ORAL_TABLET | ORAL | Status: DC | PRN
  Administered 2021-11-16 – 2021-11-21 (×3): 5 mg via ORAL
  Filled 2021-11-16 (×3): qty 1

## 2021-11-16 MED ORDER — ACETAMINOPHEN 325 MG PO TABS: 650.0000 mg | ORAL_TABLET | ORAL | Status: DC | PRN

## 2021-11-16 NOTE — Hospital Course (Signed)
Dylan Good is a 51 yo male with PMH RCC with pulmonary mets, HTN, GERD who presented with worsening cough, SOB, and CP.  He has had no improvement with nebs and steroids at home.  Due to ongoing symptoms, he presented for further evaluation. ?CTA chest was performed which showed extensive thoracic metastatic disease with progression from February 2023 along with mediastinal adenopathy which has invaded the right mainstem bronchus with obstruction.  No PE. ?Case was discussed with oncology who also consulted radiation oncology. ?

## 2021-11-16 NOTE — Consult Note (Signed)
? ?                                                                                ?Consultation Note ?Date: 11/16/2021  ? ?Patient Name: Dylan Good  ?DOB: 1971/01/18  MRN: [id number]  Age / Sex: 51 y.o., male  ?PCP: Pcp, No ?Referring Physician: Dwyane Dee, MD ? ?Reason for Consultation: Establishing goals of care ? ?HPI/Patient Profile: 51 y.o. male  with past medical history of RCC w/ pulm mets, HTN, GERD admitted on 11/15/2021 with worsening dyspnea.  ? ?Chest CT shows disease progression since Feb 2023 with right mainstem bronchus obstruction, possibly needing palliative radiation. PMT has been consulted to assist with goals of care conversation. ? ?Clinical Assessment and Goals of Care: ? ?I have reviewed medical records including EPIC notes, labs and imaging, received report from RN, assessed the patient and then met at the bedside to discuss diagnosis prognosis, GOC, EOL wishes, disposition and options. ? ?I introduced Palliative Medicine as specialized medical care for people living with serious illness. It focuses on providing relief from the symptoms and stress of a serious illness. The goal is to improve quality of life for both the patient and the family. ? ?We discussed a brief life review of the patient and then focused on their current illness. The natural disease trajectory and expectations at EOL were discussed. ? ?I attempted to elicit values and goals of care important to the patient.   ? ?Medical History Review and Understanding: ?Patient was diagnosed with cancer last year on his birthday. He tells me that he has never been as sick as he is during this admission. ? ?Social History: ?Patient has been separated from his wife for a long time, now lives at home with his current girlfriend. He has two sons and two daughters, all of whom are adults and support him as able. He has 1 brother and friends who are supportive as well. He believes in God. 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   ? ?Functional and Nutritional State: ?Patient shares that he has a walker and just recently acquired a wheelchair with his current episode of dyspnea on exertion.  ? ?Palliative Symptoms: ?Cough, dyspnea ? ?Advance Directives: ?A detailed discussion regarding advanced directives was had. ? ? ?Code Status: ?Concepts specific to code status, artifical feeding and hydration, and rehospitalization were considered and discussed. ? ?Discussion: ?Patient tells me that he is an easy-going and non-confrontational person, although he will speak up when he needs to advocate for himself. He has family and friends who check in on him frequently and he feels they would certainly honor his wishes if he was unable to make decisions for himself. He has told them "try to bring me back if they can, but if they can't then just let me go peacefully." He would want his girlfriend and family to make decisions together. Milton would consider short-term rehab at [facility] but strongly prefers to avoid any long-term placement unless as a last resort. His goal is to "get better" and be able to take care of himself, resume working at least part-time.  He also wants to keep learning about his cancer and treatment options. We discussed that all treatments are strictly palliative to help with symptoms and not curative. He states that he understands this. He is looking forward to discussing another treatment option next month with [name], as his most recent treatment was causing pancreatitis. He would prefer to stay inpatient as he receiving his upcoming radiation if possible rather than ask family for transportation. He would be open to any available interventions, including transfer to [facility] or an academic center. He also notes that he has an upcoming GI appointment on Monday and wonders if he will need to reschedule this. He tells me he is doing well emotionally and does not feel anxious or  worried.  ? ? ?Palliative Care services outpatient were explained and offered.  ? ?Discussed the importance of continued conversation with family and the medical providers regarding overall plan of care and treatment options, ensuring decisions are within the context of the patient?s values and GOCs.  ? ?Questions and concerns were addressed. The family was encouraged to call with questions or concerns.  PMT will continue to support holistically.  ? ?  ? ?SUMMARY OF RECOMMENDATIONS   ?-Full code/full scope treatment ?-Goal is to improve symptoms enough to work part-time and remain independent, continue learning about palliative treatment options ?-Patient will consider completion of HCPOA during hospital stay ?-Agreeable to outpatient palliative care referral, will discuss with my colleague Alda Lea NP ?-PMT will continue to follow and support ? ? ?Prognosis:  ?Poor long-term prognosis with metastatic RCC and worsening symptoms ? ?Discharge Planning: Home with Palliative Services  ? ?  ? ?Primary Diagnoses: ?Present on Admission: ? Dyspnea ? Kidney cancer, primary, with metastasis from kidney to other site [facility]) ? Hypercalcemia of malignancy ? Hypertension ? ? ?I have reviewed the medical record, interviewed the patient and family, and examined the patient. The following aspects are pertinent. ? ?Past Medical History:  ?Diagnosis Date  ? Cancer, metastatic to lung [facility])   ? Hypertension 07/01/2021  ? Renal cell adenocarcinoma, left ([place])   ? ?Social History  ? ?Socioeconomic History  ? Marital status: Single  ?  Spouse name: Not on file  ? Number of children: Not on file  ? Years of education: Not on file  ? Highest education level: Not on file  ?Occupational History  ? Not on file  ?Tobacco Use  ? Smoking status: Some Days  ?  Packs/day: 0.20  ?  Types: Cigarettes  ? Smokeless tobacco: Never  ?Vaping Use  ? Vaping Use: Never used  ?Substance and Sexual Activity  ? Alcohol use: Never  ? Drug use:  Never  ? Sexual activity: Not on file  ?Other Topics Concern  ? Not on file  ?Social History Narrative  ? Not on file  ? ?Social Determinants of Health  ? ?Financial Resource Strain: Not on file  ?Food Insecurity: Not on file  ?Transportation Needs: Not on file  ?Physical Activity: Not on file  ?Stress: Not on file  ?Social Connections: Not on file  ? ?Family History  ?Problem Relation Age of Onset  ? Lupus Mother   ? Cancer Other   ? ?Scheduled Meds: ? amLODipine  5 mg Oral Daily  ? ipratropium-albuterol  3 mL Nebulization TID  ? metoprolol succinate  25 mg Oral Daily  ? predniSONE  40 mg Oral Q breakfast  ? ?Continuous Infusions: ?PRN Meds:.acetaminophen **OR** acetaminophen, albuterol, HYDROcodone bit-homatropine, HYDROcodone-acetaminophen, morphine injection, ondansetron **  OR** ondansetron (ZOFRAN) IV, pantoprazole ?Medications Prior to Admission:  ?Prior to Admission medications   ?Medication Sig Start Date End Date Taking? Authorizing Provider  ?acetaminophen (TYLENOL) 325 MG tablet Take 2 tablets (650 mg total) by mouth every 6 (six) hours as needed for mild pain (or Fever >/= 101). 10/07/21  Yes Arrien, [name], MD  ?albuterol (ACCUNEB) 0.63 MG/3ML nebulizer solution Take 1 ampule by nebulization 3 (three) times daily as needed for shortness of breath or wheezing. 09/11/21  Yes [provider]  ?amLODipine (NORVASC) 5 MG tablet TAKE 1 TABLET (5 MG TOTAL) BY MOUTH DAILY. 07/17/21  Yes [name], MD  ?HYDROcodone bit-homatropine (HYCODAN) 5-1.5 MG/5ML syrup Take 5 mLs by mouth every 6 (six) hours as needed for cough. 11/05/21  Yes Molpus, John, MD  ?metoprolol succinate (TOPROL-XL) 25 MG 24 hr tablet Take 25 mg by mouth daily. 09/20/21  Yes [provider]  ?pantoprazole (PROTONIX) 40 MG tablet Take 1 tablet (40 mg total) by mouth daily. ?Patient taking differently: Take 40 mg by mouth daily as needed (for heartburn). 08/14/21  Yes Eugenie Filler, MD  ?polyethylene glycol (MIRALAX /  GLYCOLAX) 17 g packet Take 17 g by mouth daily as needed for mild constipation. 09/16/21  Yes [name], MD  ?Dellis Anes 80-4.5 MCG/ACT inhaler Inhale 2 puffs into the lungs daily as needed (for shortness

## 2021-11-16 NOTE — Progress Notes (Signed)
?Progress Note ? ? ? ?Dylan Good   ?[facility]:[id number]  ?DOB: 07-20-1970  ?DOA: 11/15/2021     0 ?PCP: Dylan Good ? ?Initial CC: Shortness of breath, coughing ? ?Hospital Course: ?Dylan Good is a 51 yo male with PMH RCC with pulmonary mets, HTN, GERD who presented with worsening cough, SOB, and CP.  He has had Good improvement with nebs and steroids at home.  Due to ongoing symptoms, he presented for further evaluation. ?CTA chest was performed which showed extensive thoracic metastatic disease with progression from February 2023 along with mediastinal adenopathy which has invaded the right mainstem bronchus with obstruction.  Good PE. ?Case was discussed with oncology who also consulted radiation oncology. ? ?Interval History:  ?Seen in his room this morning still with ongoing cough.  He did endorse some improvement with the Hycodan cough syrup.  Tentative plan is for possible radiation to the chest for treatment of symptom burden. ? ?Assessment and Plan: ?* Dyspnea ?- See cancer treatment above ?- Continue breathing treatments ? ?Kidney cancer, primary, with metastasis from kidney to other site [facility]) ?- CTA chest on admission shows right mainstem bronchus obstruction, presumably explaining his symptoms on admission ?- Follow-up further plan for radiation during hospitalization ?- Oncology notes reviewed.  Treatment at this point is strictly palliative.  If patient were to worsen, hospice would not be unwarranted.  He is followed by palliative care at home he says. ? ?Hypercalcemia of malignancy ?- follow up PTH, but suspected in setting of malignancy  ?- corrected Ca 12.5 mg/dL; asymptomatic ?- hold off on further treatment at this time and trend; if continues to rise, will initiate treatment  ? ?Hypertension ?- Continue amlodipine and Toprol ? ?Chest pain, non-cardiac ?- Negative troponin x2 ?- Good signs of ischemia on EKG ?- Known left-sided lung mass likely contributing to his symptoms ?- Good further work-up indicated at  this time ? ?GERD (gastroesophageal reflux disease) ?- Continue PPI ? ? ?Old records reviewed in assessment of this patient ? ?Antimicrobials: ? ? ?DVT prophylaxis:  ?SCDs Start: 11/15/21 1529 ? ? ?Code Status:   Code Status: Full Code ? ?Disposition Plan: Home in 2 to 3 days ?Status is: Observation ? ?Objective: ?Blood pressure 115/68, pulse 94, temperature (!) 97.3 ?F (36.3 ?C), temperature source Oral, resp. rate (!) 22, height 5\' 9"  (1.753 m), weight 67.7 kg, SpO2 96 %.  ?Examination:  ?Physical Exam ?Constitutional:   ?   Comments: Uncomfortable appearing and actively coughing  ?HENT:  ?   Head: Normocephalic and atraumatic.  ?   Mouth/Throat:  ?   Mouth: Mucous membranes are moist.  ?Eyes:  ?   Extraocular Movements: Extraocular movements intact.  ?Cardiovascular:  ?   Rate and Rhythm: Normal rate and regular rhythm.  ?   Heart sounds: Normal heart sounds.  ?Pulmonary:  ?   Comments: Diffuse coarse breath sounds bilaterally ?Abdominal:  ?   General: Bowel sounds are normal. There is Good distension.  ?   Palpations: Abdomen is soft.  ?   Tenderness: There is Good abdominal tenderness.  ?Musculoskeletal:     ?   General: Normal range of motion.  ?   Cervical back: Normal range of motion and neck supple.  ?Skin: ?   General: Skin is warm and dry.  ?Neurological:  ?   General: Good focal deficit present.  ?   Mental Status: He is alert.  ?Psychiatric:     ?   Mood and Affect: Mood normal.     ?  Behavior: Behavior normal.  ?  ? ?Consultants:  ?Oncology ?Radiation oncology ? ?Procedures:  ? ? ?Data Reviewed: ?Results for orders placed or performed during the hospital encounter of 11/15/21 (from the past 24 hour(s))  ?Comprehensive metabolic panel     Status: Abnormal  ? Collection Time: 11/16/21  5:13 AM  ?Result Value Ref Range  ? Sodium 134 (L) 135 - 145 mmol/L  ? Potassium 4.2 3.5 - 5.1 mmol/L  ? Chloride 103 98 - 111 mmol/L  ? CO2 21 (L) 22 - 32 mmol/L  ? Glucose, Bld 144 (H) 70 - 99 mg/dL  ? BUN 15 6 - 20 mg/dL  ?  Creatinine, Ser 0.70 0.61 - 1.24 mg/dL  ? Calcium 12.0 (H) 8.9 - 10.3 mg/dL  ? Total Protein 7.3 6.5 - 8.1 g/dL  ? Albumin 3.4 (L) 3.5 - 5.0 g/dL  ? AST 13 (L) 15 - 41 U/L  ? ALT 12 0 - 44 U/L  ? Alkaline Phosphatase 55 38 - 126 U/L  ? Total Bilirubin 0.5 0.3 - 1.2 mg/dL  ? GFR, Estimated >60 >60 mL/min  ? Anion gap 10 5 - 15  ?CBC     Status: Abnormal  ? Collection Time: 11/16/21  5:13 AM  ?Result Value Ref Range  ? WBC 7.6 4.0 - 10.5 K/uL  ? RBC 4.36 4.22 - 5.81 MIL/uL  ? Hemoglobin 11.1 (L) 13.0 - 17.0 g/dL  ? HCT 35.0 (L) 39.0 - 52.0 %  ? MCV 80.3 80.0 - 100.0 fL  ? MCH 25.5 (L) 26.0 - 34.0 pg  ? MCHC 31.7 30.0 - 36.0 g/dL  ? RDW 16.9 (H) 11.5 - 15.5 %  ? Platelets 352 150 - 400 K/uL  ? nRBC 0.0 0.0 - 0.2 %  ?  ?I have Reviewed nursing notes, Vitals, and Lab results since pt's last encounter. Pertinent lab results : see above ?I have ordered test including BMP, CBC, Mg ?I have reviewed the last note from staff over past 24 hours ?I have discussed pt's care plan and test results with nursing staff, case manager ? ? LOS: 0 days  ? ?Dylan Dee, MD ?[facility] ?11/16/2021, 10:32 AM ?

## 2021-11-16 NOTE — Assessment & Plan Note (Signed)
-   See cancer treatment above ?- Continue breathing treatments ?

## 2021-11-16 NOTE — Progress Notes (Signed)
?Radiation Oncology         ([phone]) [phone] ?________________________________ ? ?Inpatient Re-Consultation ? ?Name: Dylan Good MRN: [id number]  ?Date: 11/16/2021  DOB: 1971-02-06 ? ?CC:Pcp, No  [name], MD  ? ?REFERRING PHYSICIAN: [name], MD ? ?DIAGNOSIS:  ?  ICD-10-CM   ?1. 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  ?2. 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  ? ? ? Cancer Staging  ?Kidney cancer, primary, with metastasis from kidney to other site [facility]) ?Staging form: Kidney, AJCC 8th Edition ?- Clinical: Stage IV (cTX, cNX, pM1) - Signed by [name], MD on 02/26/2021 ? ?STAGE IV metastatic kidney cancer ? ?CHIEF COMPLAINT: Here to discuss management of metastatic renal cancer   ? ?HISTORY OF PRESENT ILLNESS::Dylan Good is a 51 y.o. male who is seen today for inpatient re-evaluation and for consideration of further radiation therapy in management of his recent progression of metastatic RCC. I last met with the patient for follow up on 08/17/21. ? ?The patient presented to the ED yesterday with the cc of dyspnea x 2 weeks. Per record review, the patient presented to the ED in the beginning of this month as well with the same complaint and was given steroids and nebs with improvement of symptoms. However, the patient eventually developed increasing SOB and cough which his inhalers and cough medicine did not aid. The night prior to presentation, the patient also developed dull left chest/axillary pain which prompted him to present to the ED.  ? ?CXR performed yesterday showed findings consistent with thoracic metastatic disease with worsening aeration at the left base when compared to imaging 10 days prior. A possible increasing left sided pleural effusion was also appreciated.  ? ?CTA of the chest also performed yesterday further revealed an overall increase in size of the numerous pulmonary nodules, including: a  dominant nodule on the right along the anterior aspect of the minor fissure measuring 2.2 cm (a few mm larger than before), and a mass in the left lower lobe measuring 4.5 cm. CT also showed an increase in size of a left fifth rib metastasis laterally, measuring 6.3 cm, previously 4.4 cm, and an increase in size of an L1 upper body lucency, suspicious for metastasis. Progression of mediastinal adenopathy was also seen, especially in the pre and right paratracheal region with lower tracheal and right mainstem bronchus invasion causing right mainstem bronchus obstruction.   ? ?Given evidence of disease progression on imaging, attending hospitalist recommended admission and referrals to oncology for discussion of further treatment.  ? ?He has a nagging cough that is hard to stop.  He presents in a wheelchair with his significant other. ? ?PREVIOUS RADIATION THERAPY:  ? ?Radiation Treatment Dates: 01/29/2021 through 01/29/2021 ?Site Technique Total Dose (Gy) Dose per Fx (Gy) Completed Fx Beam Energies  ?Brain: Brain_SRS IMRT 20/20 20 1/1 6XFFF  ? ? ?PAST MEDICAL HISTORY:  has a past medical history of Cancer, metastatic to lung [facility]), Hypertension (07/01/2021), and Renal cell adenocarcinoma, left ([place]).   ? ?PAST SURGICAL HISTORY: ?Past Surgical History:  ?Procedure Laterality Date  ? APPENDECTOMY    ? BIOPSY  07/03/2021  ? Procedure: BIOPSY;  Surgeon: Ronnette Juniper, MD;  Location: [facility];  Service: Gastroenterology;;  ? CHEST TUBE INSERTION Left 02/06/2021  ? Procedure: INSERTION PLEURAL DRAINAGE CATHETER;  Surgeon: Garner Nash, DO;  Location: [facility]COPY;  Service: Pulmonary;  Laterality: Left;  ? CHEST TUBE INSERTION N/A 04/04/2021  ? Procedure: removal of pleurex cath;  Surgeon: Garner Nash, DO;  Location: [facility] ENDOSCOPY;  Service: Pulmonary;  Laterality: N/A;  ? ESOPHAGOGASTRODUODENOSCOPY (EGD) WITH PROPOFOL N/A 07/03/2021  ? Procedure: ESOPHAGOGASTRODUODENOSCOPY (EGD) WITH PROPOFOL;  Surgeon: Ronnette Juniper,  MD;  Location: [facility] ENDOSCOPY;  Service: Gastroenterology;  Laterality: N/A;  ? ? ?FAMILY HISTORY: family history includes Cancer in an other family member; Lupus in his mother. ? ?SOCIAL HISTORY:  reports that he has been smoking cigarettes. He has been smoking an average of .2 packs per day. He has never used smokeless tobacco. He reports that he does not drink alcohol and does not use drugs. ? ?ALLERGIES: Patient has no known allergies. ? ?MEDICATIONS:  ?No current facility-administered medications for this encounter.  ? ?No current outpatient medications on file.  ? ?Facility-Administered Medications Ordered in Other Encounters  ?Medication Dose Route Frequency Provider Last Rate Last Admin  ? acetaminophen (TYLENOL) tablet 650 mg  650 mg Oral Q4H PRN Dwyane Dee, MD      ? albuterol (PROVENTIL) (2.5 MG/3ML) 0.083% nebulizer solution 2.5 mg  2.5 mg Nebulization Q4H PRN Marylyn Ishihara, Tyrone A, DO      ? amLODipine (NORVASC) tablet 5 mg  5 mg Oral Daily Kyle, Tyrone A, DO   5 mg at 11/16/21 0917  ? feeding supplement (ENSURE ENLIVE / ENSURE PLUS) liquid 237 mL  237 mL Oral BID PRN Dwyane Dee, MD      ? HYDROcodone bit-homatropine (HYCODAN) 5-1.5 MG/5ML syrup 5 mL  5 mL Oral Q6H PRN Marylyn Ishihara, Tyrone A, DO   5 mL at 11/16/21 1112  ? ipratropium-albuterol (DUONEB) 0.5-2.5 (3) MG/3ML nebulizer solution 3 mL  3 mL Nebulization TID Marylyn Ishihara, Tyrone A, DO   3 mL at 11/16/21 1416  ? metoprolol succinate (TOPROL-XL) 24 hr tablet 25 mg  25 mg Oral Daily Kyle, Tyrone A, DO   25 mg at 11/16/21 0917  ? morphine (PF) 4 MG/ML injection 4 mg  4 mg Intravenous Q3H PRN Dwyane Dee, MD   4 mg at 11/16/21 1344  ? ondansetron (ZOFRAN) tablet 4 mg  4 mg Oral Q6H PRN Cherylann Ratel A, DO      ? Or  ? ondansetron (ZOFRAN) injection 4 mg  4 mg Intravenous Q6H PRN Marylyn Ishihara, Tyrone A, DO      ? oxyCODONE (Oxy IR/ROXICODONE) immediate release tablet 5 mg  5 mg Oral Q4H PRN Dwyane Dee, MD   5 mg at 11/16/21 1548  ? pantoprazole (PROTONIX) EC tablet 40  mg  40 mg Oral Daily PRN Marylyn Ishihara, Tyrone A, DO      ? predniSONE (DELTASONE) tablet 40 mg  40 mg Oral Q breakfast Kyle, Tyrone A, DO   40 mg at 11/16/21 1548  ? ? ?REVIEW OF SYSTEMS:  Notable for that above. ?  ?PHYSICAL EXAM:  vitals were not taken for this visit.   ?General: Alert and oriented, in no acute distress - he presents in a wheelchair ?HEENT: Head is normocephalic.  ?Heart is tachycardic and regular in rhythm  ?Chest: Clear to auscultation bilaterally, with no rhonchi, wheezes, or rales. ?Psychiatric: Judgment and insight are intact. Affect is appropriate. ? ? ?ECOG = 2 ? ?0 - Asymptomatic (Fully active, able to carry on all predisease activities without restriction) ? ?1 - Symptomatic but completely ambulatory (Restricted in physically strenuous activity but ambulatory and able to carry out work of a light or sedentary nature. For  example, light housework, office work) ? ?2 - Symptomatic, <50% in bed during the day (Ambulatory and capable of all self care but unable to carry out any work activities. Up and about more than 50% of waking hours) ? ?3 - Symptomatic, >50% in bed, but not bedbound (Capable of only limited self-care, confined to bed or chair 50% or more of waking hours) ? ?4 - Bedbound (Completely disabled. Cannot carry on any self-care. Totally confined to bed or chair) ? ?5 - Death ? ? Oken MM, Creech RH, Tormey DC, et al. [phone]). "Toxicity and response criteria of the [facility]". [place] Oncol. 5 (6): 649-55 ? ? ?LABORATORY DATA:  ?Lab Results  ?Component Value Date  ? WBC 7.6 11/16/2021  ? HGB 11.1 (L) 11/16/2021  ? HCT 35.0 (L) 11/16/2021  ? MCV 80.3 11/16/2021  ? PLT 352 11/16/2021  ? ?CMP  ?   ?Component Value Date/Time  ? NA 134 (L) 11/16/2021 0513  ? K 4.2 11/16/2021 0513  ? CL 103 11/16/2021 0513  ? CO2 21 (L) 11/16/2021 0513  ? GLUCOSE 144 (H) 11/16/2021 0513  ? BUN 15 11/16/2021 0513  ? CREATININE 0.70 11/16/2021 0513  ? CREATININE 0.61 11/02/2021 0936  ?  CALCIUM 12.0 (H) 11/16/2021 0513  ? CALCIUM NOT PERFORMED 11/15/2021 1545  ? PROT 7.3 11/16/2021 0513  ? ALBUMIN 3.4 (L) 11/16/2021 0513  ? AST 13 (L) 11/16/2021 0513  ? AST 8 (L) 11/02/2021 0936  ? ALT 12

## 2021-11-16 NOTE — Assessment & Plan Note (Signed)
-   Negative troponin x2 ?- No signs of ischemia on EKG ?- Known left-sided lung mass likely contributing to his symptoms ?- No further work-up indicated at this time ?

## 2021-11-16 NOTE — Assessment & Plan Note (Deleted)
-   CTA chest on admission shows right mainstem bronchus obstruction, presumably explaining his symptoms on admission ?- Follow-up further plan for radiation during hospitalization ?- Oncology notes reviewed.  Treatment at this point is strictly palliative.  If patient were to worsen, hospice would not be unwarranted.  He is followed by palliative care at home he says. ?

## 2021-11-16 NOTE — Assessment & Plan Note (Addendum)
-   Continue amlodipine and Toprol ?

## 2021-11-16 NOTE — Assessment & Plan Note (Addendum)
-   suspected in setting of malignancy  ?- Calcium improved some today.  No indication for treatment ?- Continue monitoring ?

## 2021-11-16 NOTE — Assessment & Plan Note (Signed)
Continue PPI ?

## 2021-11-16 NOTE — Progress Notes (Signed)
Initial Nutrition Assessment ? ?DOCUMENTATION CODES:  ? ?Non-severe (moderate) malnutrition in context of chronic illness ? ?INTERVENTION:  ? ?-Ensure Plus High Protein po BID PRN, each supplement provides 350 kcal and 20 grams of protein. Pt requested they not be scheduled. ? ?-Liberalized diet to regular diet to expand menu options ? ? ?NUTRITION DIAGNOSIS:  ? ?Moderate Malnutrition related to chronic illness, cancer and cancer related treatments as evidenced by mild muscle depletion, mild fat depletion, percent weight loss. ? ?GOAL:  ? ?Patient will meet greater than or equal to 90% of their needs ? ?MONITOR:  ? ?Supplement acceptance, Weight trends, Labs, PO intake, I & O's ? ?REASON FOR ASSESSMENT:  ? ?Malnutrition Screening Tool ?  ? ?ASSESSMENT:  ? ?51 yo male with PMH RCC with pulmonary mets, HTN, GERD who presented with worsening cough, SOB, and CP. Admitted for dyspnea. ? ?Patient in room, pt to have radiation this afternoon. Pt reports good appetite and he has been eating. States he has increased coughing. Drink Boost at home when he thinks he needs to but doesn't drink consistently. Denies any issues with chewing or swallowing. Foods are tasting normal. ? ?Pt to request supplements from unit. Ordered PRN. ? ?Pt reports 5 lbs of weight loss. ?Per weight records, pt has lost 35 lbs since 03/19/21 (19% wt loss x 8 months, significant for time frame).  ? ?Medications reviewed. ? ?Labs reviewed: ?Low Na ? ?NUTRITION - FOCUSED PHYSICAL EXAM: ? ?Flowsheet Row Most Recent Value  ?Orbital Region Mild depletion  ?Upper Arm Region Moderate depletion  ?Thoracic and Lumbar Region No depletion  ?Buccal Region No depletion  ?Temple Region Mild depletion  ?Clavicle Bone Region Mild depletion  ?Clavicle and Acromion Bone Region Mild depletion  ?Scapular Bone Region Mild depletion  ?Dorsal Hand No depletion  ?Patellar Region Mild depletion  ?Anterior Thigh Region Mild depletion  ?Posterior Calf Region Mild depletion   ?Edema (RD Assessment) None  ?Hair Reviewed  ?Eyes Reviewed  ?Mouth Reviewed  [poor dentition]  ?Skin Reviewed  ? ?  ? ? ?Diet Order:   ?Diet Order   ? ?       ?  Diet regular Room service appropriate? Yes; Fluid consistency: Thin  Diet effective now       ?  ? ?  ?  ? ?  ? ? ?EDUCATION NEEDS:  ? ?No education needs have been identified at this time ? ?Skin:  Skin Assessment: Reviewed RN Assessment ? ?Last BM:  5/10 ? ?Height:  ? ?Ht Readings from Last 1 Encounters:  ?11/15/21 5\' 9"  (1.753 m)  ? ? ?Weight:  ? ?Wt Readings from Last 1 Encounters:  ?11/15/21 67.7 kg  ? ? ?BMI:  Body mass index is 22.05 kg/m?. ? ?Estimated Nutritional Needs:  ? ?Kcal:  2100-2300 ? ?Protein:  100-115g ? ?Fluid:  2.1L/day ? ?Clayton Bibles, MS, RD, LDN ?Inpatient Clinical Dietitian ?Contact information available via Amion ? ?

## 2021-11-17 DIAGNOSIS — C7802 Secondary malignant neoplasm of left lung: Secondary | ICD-10-CM

## 2021-11-17 DIAGNOSIS — C649 Malignant neoplasm of unspecified kidney, except renal pelvis: Secondary | ICD-10-CM | POA: Diagnosis not present

## 2021-11-17 DIAGNOSIS — E440 Moderate protein-calorie malnutrition: Secondary | ICD-10-CM | POA: Insufficient documentation

## 2021-11-17 LAB — CBC WITH DIFFERENTIAL/PLATELET
Abs Immature Granulocytes: 0.1 10*3/uL — ABNORMAL HIGH (ref 0.00–0.07)
Basophils Absolute: 0 10*3/uL (ref 0.0–0.1)
Basophils Relative: 0 %
Eosinophils Absolute: 0 10*3/uL (ref 0.0–0.5)
Eosinophils Relative: 0 %
HCT: 34.9 % — ABNORMAL LOW (ref 39.0–52.0)
Hemoglobin: 11.2 g/dL — ABNORMAL LOW (ref 13.0–17.0)
Immature Granulocytes: 1 %
Lymphocytes Relative: 5 %
Lymphs Abs: 0.6 10*3/uL — ABNORMAL LOW (ref 0.7–4.0)
MCH: 26 pg (ref 26.0–34.0)
MCHC: 32.1 g/dL (ref 30.0–36.0)
MCV: 81 fL (ref 80.0–100.0)
Monocytes Absolute: 1 10*3/uL (ref 0.1–1.0)
Monocytes Relative: 7 %
Neutro Abs: 11.4 10*3/uL — ABNORMAL HIGH (ref 1.7–7.7)
Neutrophils Relative %: 87 %
Platelets: 346 10*3/uL (ref 150–400)
RBC: 4.31 MIL/uL (ref 4.22–5.81)
RDW: 17 % — ABNORMAL HIGH (ref 11.5–15.5)
WBC: 13.1 10*3/uL — ABNORMAL HIGH (ref 4.0–10.5)
nRBC: 0 % (ref 0.0–0.2)

## 2021-11-17 LAB — BASIC METABOLIC PANEL
Anion gap: 6 (ref 5–15)
BUN: 21 mg/dL — ABNORMAL HIGH (ref 6–20)
CO2: 23 mmol/L (ref 22–32)
Calcium: 11.8 mg/dL — ABNORMAL HIGH (ref 8.9–10.3)
Chloride: 106 mmol/L (ref 98–111)
Creatinine, Ser: 0.64 mg/dL (ref 0.61–1.24)
GFR, Estimated: >60 mL/min (ref >60)
Glucose, Bld: 152 mg/dL — ABNORMAL HIGH (ref 70–99)
Potassium: 4.1 mmol/L (ref 3.5–5.1)
Sodium: 135 mmol/L (ref 135–145)

## 2021-11-17 LAB — MAGNESIUM: Magnesium: 2.2 mg/dL (ref 1.7–2.4)

## 2021-11-17 MED ORDER — DEXTROMETHORPHAN POLISTIREX ER 30 MG/5ML PO SUER: 60.0000 mg | Freq: Three times a day (TID) | ORAL | Status: DC | PRN

## 2021-11-17 MED ORDER — HYDROCODONE BIT-HOMATROP MBR 5-1.5 MG/5ML PO SOLN
5.0000 mL | ORAL | Status: DC | PRN
  Administered 2021-11-17 – 2021-11-22 (×10): 5 mL via ORAL
  Filled 2021-11-17 (×11): qty 5

## 2021-11-17 MED ORDER — DEXTROMETHORPHAN POLISTIREX ER 30 MG/5ML PO SUER
60.0000 mg | Freq: Two times a day (BID) | ORAL | Status: DC | PRN
  Administered 2021-11-17 – 2021-11-22 (×8): 60 mg via ORAL
  Filled 2021-11-17 (×14): qty 10

## 2021-11-17 MED ORDER — HYDROMORPHONE HCL 1 MG/ML IJ SOLN
1.0000 mg | INTRAMUSCULAR | Status: DC | PRN
  Administered 2021-11-17 – 2021-11-20 (×7): 1 mg via INTRAVENOUS
  Filled 2021-11-17 (×7): qty 1

## 2021-11-17 MED ORDER — HYDROCODONE BIT-HOMATROP MBR 5-1.5 MG/5ML PO SOLN: 10.0000 mL | Freq: Four times a day (QID) | ORAL | Status: DC | PRN

## 2021-11-17 MED ORDER — HYDROMORPHONE HCL 1 MG/ML IJ SOLN
0.5000 mg | INTRAMUSCULAR | Status: DC
  Administered 2021-11-17 – 2021-11-21 (×21): 0.5 mg via INTRAVENOUS
  Filled 2021-11-17 (×21): qty 0.5

## 2021-11-17 NOTE — Progress Notes (Signed)
?Progress Note ? ? ? ?Dylan Good   ?VOH:[id number]  ?DOB: 01-Sep-1970  ?DOA: 11/15/2021     1 ?PCP: Pcp, No ? ?Initial CC: Shortness of breath, coughing ? ?Hospital Course: ?Mr. Dylan Good is a 51 yo male with PMH RCC with pulmonary mets, HTN, GERD who presented with worsening cough, SOB, and CP.  He has had no improvement with nebs and steroids at home.  Due to ongoing symptoms, he presented for further evaluation. ?CTA chest was performed which showed extensive thoracic metastatic disease with progression from February 2023 along with mediastinal adenopathy which has invaded the right mainstem bronchus with obstruction.  No PE. ?Case was discussed with oncology who also consulted radiation oncology. ? ?Interval History:  ?Had first course of radiation yesterday.  His significant other is bedside this morning.  His cough is his bigger complaint at this time.  Not really responding much to treatment being tried and further adjustment being made today. ? ?Assessment and Plan: ?* Metastatic renal cell carcinoma to lung [facility]) ?- CTA chest on admission shows right mainstem bronchus obstruction, presumably explaining his symptoms on admission ?-Evaluated by radiation oncology.  5 fractions planned.  First radiation treatment completed on 11/16/2021 and to be resumed on 11/19/2021 ?- Oncology notes reviewed.  Treatment at this point is strictly palliative.  If patient were to worsen, hospice would not be unwarranted.  He is followed by palliative care at home he says ?-Cough is one of his bigger complaints at this time.  Continue Hycodan and Delsym.  Start on scheduled Dilaudid and also continue PRN Dilaudid.  If does not further improve, will likely consult palliative care for further symptom management ? ?Hypercalcemia of malignancy ?- suspected in setting of malignancy  ?-Calcium improved some today.  No indication for treatment ?- Continue monitoring ? ?Hypertension ?- Continue amlodipine and Toprol ? ?Chest pain,  non-cardiac ?- Negative troponin x2 ?- No signs of ischemia on EKG ?- Known left-sided lung mass likely contributing to his symptoms ?- No further work-up indicated at this time ? ?GERD (gastroesophageal reflux disease) ?- Continue PPI ? ?Dyspnea ?- See cancer treatment above ?- Continue breathing treatments ? ? ?Old records reviewed in assessment of this patient ? ?Antimicrobials: ? ? ?DVT prophylaxis:  ?SCDs Start: 11/15/21 1529 ? ? ?Code Status:   Code Status: Full Code ? ?Disposition Plan: Home possibly Thursday after last course of radiation ?Status is: Inpatient ? ?Objective: ?Blood pressure 126/69, pulse 79, temperature 98.1 ?F (36.7 ?C), temperature source Oral, resp. rate 18, height 5\' 9"  (1.753 m), weight 67.7 kg, SpO2 98 %.  ?Examination:  ?Physical Exam ?Constitutional:   ?   Comments: Uncomfortable appearing and actively coughing  ?HENT:  ?   Head: Normocephalic and atraumatic.  ?   Mouth/Throat:  ?   Mouth: Mucous membranes are moist.  ?Eyes:  ?   Extraocular Movements: Extraocular movements intact.  ?Cardiovascular:  ?   Rate and Rhythm: Normal rate and regular rhythm.  ?   Heart sounds: Normal heart sounds.  ?Pulmonary:  ?   Comments: Diffuse coarse breath sounds bilaterally ?Abdominal:  ?   General: Bowel sounds are normal. There is no distension.  ?   Palpations: Abdomen is soft.  ?   Tenderness: There is no abdominal tenderness.  ?Musculoskeletal:     ?   General: Normal range of motion.  ?   Cervical back: Normal range of motion and neck supple.  ?Skin: ?   General: Skin is warm and  dry.  ?Neurological:  ?   General: No focal deficit present.  ?   Mental Status: He is alert.  ?Psychiatric:     ?   Mood and Affect: Mood normal.     ?   Behavior: Behavior normal.  ?  ? ?Consultants:  ?Oncology ?Radiation oncology ? ?Procedures:  ? ? ?Data Reviewed: ?Results for orders placed or performed during the hospital encounter of 11/15/21 (from the past 24 hour(s))  ?CBC with Differential/Platelet      Status: Abnormal  ? Collection Time: 11/17/21  5:44 AM  ?Result Value Ref Range  ? WBC 13.1 (H) 4.0 - 10.5 K/uL  ? RBC 4.31 4.22 - 5.81 MIL/uL  ? Hemoglobin 11.2 (L) 13.0 - 17.0 g/dL  ? HCT 34.9 (L) 39.0 - 52.0 %  ? MCV 81.0 80.0 - 100.0 fL  ? MCH 26.0 26.0 - 34.0 pg  ? MCHC 32.1 30.0 - 36.0 g/dL  ? RDW 17.0 (H) 11.5 - 15.5 %  ? Platelets 346 150 - 400 K/uL  ? nRBC 0.0 0.0 - 0.2 %  ? Neutrophils Relative % 87 %  ? Neutro Abs 11.4 (H) 1.7 - 7.7 K/uL  ? Lymphocytes Relative 5 %  ? Lymphs Abs 0.6 (L) 0.7 - 4.0 K/uL  ? Monocytes Relative 7 %  ? Monocytes Absolute 1.0 0.1 - 1.0 K/uL  ? Eosinophils Relative 0 %  ? Eosinophils Absolute 0.0 0.0 - 0.5 K/uL  ? Basophils Relative 0 %  ? Basophils Absolute 0.0 0.0 - 0.1 K/uL  ? Immature Granulocytes 1 %  ? Abs Immature Granulocytes 0.10 (H) 0.00 - 0.07 K/uL  ?Basic metabolic panel     Status: Abnormal  ? Collection Time: 11/17/21  5:44 AM  ?Result Value Ref Range  ? Sodium 135 135 - 145 mmol/L  ? Potassium 4.1 3.5 - 5.1 mmol/L  ? Chloride 106 98 - 111 mmol/L  ? CO2 23 22 - 32 mmol/L  ? Glucose, Bld 152 (H) 70 - 99 mg/dL  ? BUN 21 (H) 6 - 20 mg/dL  ? Creatinine, Ser 0.64 0.61 - 1.24 mg/dL  ? Calcium 11.8 (H) 8.9 - 10.3 mg/dL  ? GFR, Estimated >60 >60 mL/min  ? Anion gap 6 5 - 15  ?Magnesium     Status: None  ? Collection Time: 11/17/21  5:44 AM  ?Result Value Ref Range  ? Magnesium 2.2 1.7 - 2.4 mg/dL  ?  ?I have Reviewed nursing notes, Vitals, and Lab results since pt's last encounter. Pertinent lab results : see above ?I have ordered test including BMP, CBC, Mg ?I have reviewed the last note from staff over past 24 hours ?I have discussed pt's care plan and test results with nursing staff, case manager ? ? LOS: 1 day  ? ?Dwyane Dee, MD ?[facility] ?11/17/2021, 6:11 PM ?

## 2021-11-17 NOTE — Assessment & Plan Note (Addendum)
-   CTA chest on admission shows right mainstem bronchus obstruction, presumably explaining his symptoms on admission ?-Evaluated by radiation oncology.  5 fractions planned.  First radiation treatment completed on 11/16/2021 and to be resumed on 11/19/2021 ?- Oncology notes reviewed.  Treatment at this point is strictly palliative.  If patient were to worsen, hospice would not be unwarranted.  He is followed by palliative care at home he says ?-Cough is one of his bigger complaints at this time.  Continue Hycodan and Delsym.  Start on scheduled Dilaudid and also continue PRN Dilaudid.  Palliative care also following, will follow-up any further recommendations or changes; greatly appreciate assistance with symptom management ?

## 2021-11-18 DIAGNOSIS — C7802 Secondary malignant neoplasm of left lung: Secondary | ICD-10-CM | POA: Diagnosis not present

## 2021-11-18 DIAGNOSIS — Z7189 Other specified counseling: Secondary | ICD-10-CM

## 2021-11-18 DIAGNOSIS — C649 Malignant neoplasm of unspecified kidney, except renal pelvis: Secondary | ICD-10-CM | POA: Diagnosis not present

## 2021-11-18 LAB — BASIC METABOLIC PANEL
Anion gap: 8 (ref 5–15)
BUN: 16 mg/dL (ref 6–20)
CO2: 24 mmol/L (ref 22–32)
Calcium: 11.7 mg/dL — ABNORMAL HIGH (ref 8.9–10.3)
Chloride: 108 mmol/L (ref 98–111)
Creatinine, Ser: 0.63 mg/dL (ref 0.61–1.24)
GFR, Estimated: >60 mL/min (ref >60)
Glucose, Bld: 137 mg/dL — ABNORMAL HIGH (ref 70–99)
Potassium: 3.7 mmol/L (ref 3.5–5.1)
Sodium: 140 mmol/L (ref 135–145)

## 2021-11-18 LAB — CBC WITH DIFFERENTIAL/PLATELET
Abs Immature Granulocytes: 0.12 10*3/uL — ABNORMAL HIGH (ref 0.00–0.07)
Basophils Absolute: 0 10*3/uL (ref 0.0–0.1)
Basophils Relative: 0 %
Eosinophils Absolute: 0 10*3/uL (ref 0.0–0.5)
Eosinophils Relative: 0 %
HCT: 34.8 % — ABNORMAL LOW (ref 39.0–52.0)
Hemoglobin: 10.7 g/dL — ABNORMAL LOW (ref 13.0–17.0)
Immature Granulocytes: 1 %
Lymphocytes Relative: 10 %
Lymphs Abs: 1.4 10*3/uL (ref 0.7–4.0)
MCH: 25.1 pg — ABNORMAL LOW (ref 26.0–34.0)
MCHC: 30.7 g/dL (ref 30.0–36.0)
MCV: 81.7 fL (ref 80.0–100.0)
Monocytes Absolute: 1.6 10*3/uL — ABNORMAL HIGH (ref 0.1–1.0)
Monocytes Relative: 12 %
Neutro Abs: 10.4 10*3/uL — ABNORMAL HIGH (ref 1.7–7.7)
Neutrophils Relative %: 77 %
Platelets: 311 10*3/uL (ref 150–400)
RBC: 4.26 MIL/uL (ref 4.22–5.81)
RDW: 17.2 % — ABNORMAL HIGH (ref 11.5–15.5)
WBC: 13.5 10*3/uL — ABNORMAL HIGH (ref 4.0–10.5)
nRBC: 0 % (ref 0.0–0.2)

## 2021-11-18 LAB — MAGNESIUM: Magnesium: 2 mg/dL (ref 1.7–2.4)

## 2021-11-18 NOTE — Progress Notes (Signed)
?Progress Note ? ? ? ?Dylan Good   ?[id number]:[id number]  ?DOB: 09/15/1970  ?DOA: 11/15/2021     2 ?PCP: Pcp, No ? ?Initial CC: Shortness of breath, coughing ? ?Hospital Course: ?Dylan Good is a 51 yo male with PMH RCC with pulmonary mets, HTN, GERD who presented with worsening cough, SOB, and CP.  He has had no improvement with nebs and steroids at home.  Due to ongoing symptoms, he presented for further evaluation. ?CTA chest was performed which showed extensive thoracic metastatic disease with progression from February 2023 along with mediastinal adenopathy which has invaded the right mainstem bronchus with obstruction.  No PE. ?Case was discussed with oncology who also consulted radiation oncology. ? ?Interval History:  ?No events overnight. Some improvement in his cough today but still present.  ? 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  5 fractions planned.  First radiation treatment completed on 11/16/2021 and to be resumed on 11/19/2021 ?- Oncology notes reviewed.  Treatment at this point is strictly palliative.  If patient were to worsen, hospice would not be unwarranted.  He is followed by palliative care at home he says ?-Cough is one of his bigger complaints at this time.  Continue Hycodan and Delsym.  Start on scheduled Dilaudid and also continue PRN Dilaudid.  Palliative care also following, will follow-up any further recommendations or changes; greatly appreciate assistance with symptom management ? ?Hypercalcemia of malignancy ?- suspected in setting of malignancy  ?-Calcium improved some today.  No indication for treatment ?- Continue monitoring ? ?Hypertension ?- Continue amlodipine and Toprol ? ?Chest pain, non-cardiac ?- Negative troponin x2 ?- No signs of ischemia on EKG ?- Known left-sided lung mass likely contributing  to his symptoms ?- No further work-up indicated at this time ? ?GERD (gastroesophageal reflux disease) ?- Continue PPI ? ?Dyspnea ?- See cancer treatment above ?- Continue breathing treatments ? ? ?Old records reviewed in assessment of this patient ? ?Antimicrobials: ? ? ?DVT prophylaxis:  ?SCDs Start: 11/15/21 1529 ? ? ?Code Status:   Code Status: Full Code ? ?Disposition Plan: Home possibly Thursday after last course of radiation ?Status is: Inpatient ? ?Objective: ?Blood pressure 118/62, pulse 80, temperature 98.5 ?F (36.9 ?C), temperature source Oral, resp. rate 18, height 5\' 9"  (1.753 m), weight 67.7 kg, SpO2 95 %.  ?Examination:  ?Physical Exam ?Constitutional:   ?   Comments: A little more comfortable appearing today.  Still coughing but a little better today  ?HENT:  ?   Head: Normocephalic and atraumatic.  ?   Mouth/Throat:  ?   Mouth: Mucous membranes are moist.  ?Eyes:  ?   Extraocular Movements: Extraocular movements intact.  ?Cardiovascular:  ?   Rate and Rhythm: Normal rate and regular rhythm.  ?   Heart sounds: Normal heart sounds.  ?Pulmonary:  ?   Comments: Diffuse coarse breath sounds bilaterally ?Abdominal:  ?   General: Bowel sounds are normal. There is no distension.  ?   Palpations: Abdomen is soft.  ?   Tenderness: There is no abdominal tenderness.  ?Musculoskeletal:     ?   General: Normal range of motion.  ?   Cervical back: Normal range of motion and neck supple.  ?Skin: ?   General: Skin is warm and dry.  ?Neurological:  ?   General: No focal deficit present.  ?  Mental Status: He is alert.  ?Psychiatric:     ?   Mood and Affect: Mood normal.     ?   Behavior: Behavior normal.  ?  ? ?Consultants:  ?Oncology ?Radiation oncology ? ?Procedures:  ? ? ?Data Reviewed: ?Results for orders placed or performed during the hospital encounter of 11/15/21 (from the past 24 hour(s))  ?CBC with Differential/Platelet     Status: Abnormal  ? Collection Time: 11/18/21  5:35 AM  ?Result Value Ref Range  ?  WBC 13.5 (H) 4.0 - 10.5 K/uL  ? RBC 4.26 4.22 - 5.81 MIL/uL  ? Hemoglobin 10.7 (L) 13.0 - 17.0 g/dL  ? HCT 34.8 (L) 39.0 - 52.0 %  ? MCV 81.7 80.0 - 100.0 fL  ? MCH 25.1 (L) 26.0 - 34.0 pg  ? MCHC 30.7 30.0 - 36.0 g/dL  ? RDW 17.2 (H) 11.5 - 15.5 %  ? Platelets 311 150 - 400 K/uL  ? nRBC 0.0 0.0 - 0.2 %  ? Neutrophils Relative % 77 %  ? Neutro Abs 10.4 (H) 1.7 - 7.7 K/uL  ? Lymphocytes Relative 10 %  ? Lymphs Abs 1.4 0.7 - 4.0 K/uL  ? Monocytes Relative 12 %  ? Monocytes Absolute 1.6 (H) 0.1 - 1.0 K/uL  ? Eosinophils Relative 0 %  ? Eosinophils Absolute 0.0 0.0 - 0.5 K/uL  ? Basophils Relative 0 %  ? Basophils Absolute 0.0 0.0 - 0.1 K/uL  ? Immature Granulocytes 1 %  ? Abs Immature Granulocytes 0.12 (H) 0.00 - 0.07 K/uL  ?Basic metabolic panel     Status: Abnormal  ? Collection Time: 11/18/21  5:35 AM  ?Result Value Ref Range  ? Sodium 140 135 - 145 mmol/L  ? Potassium 3.7 3.5 - 5.1 mmol/L  ? Chloride 108 98 - 111 mmol/L  ? CO2 24 22 - 32 mmol/L  ? Glucose, Bld 137 (H) 70 - 99 mg/dL  ? BUN 16 6 - 20 mg/dL  ? Creatinine, Ser 0.63 0.61 - 1.24 mg/dL  ? Calcium 11.7 (H) 8.9 - 10.3 mg/dL  ? GFR, Estimated >60 >60 mL/min  ? Anion gap 8 5 - 15  ?Magnesium     Status: None  ? Collection Time: 11/18/21  5:35 AM  ?Result Value Ref Range  ? Magnesium 2.0 1.7 - 2.4 mg/dL  ?  ?I have Reviewed nursing notes, Vitals, and Lab results since pt's last encounter. Pertinent lab results : see above ?I have ordered test including BMP, CBC, Mg ?I have reviewed the last note from staff over past 24 hours ?I have discussed pt's care plan and test results with nursing staff, case manager ? ? LOS: 2 days  ? ?Dwyane Dee, MD ?[facility] ?11/18/2021, 1:30 PM ?

## 2021-11-18 NOTE — Progress Notes (Signed)
? ?                                                                                                                                                     ?                                                   ?Daily Progress Note  ? ?Patient Name: Dylan Good       Date: 11/18/2021 ?DOB: 07/12/1970  Age: 51 y.o. MRN#: [id number] ?Attending Physician: Dwyane Dee, MD ?Primary Care Physician: Pcp, No ?Admit Date: 11/15/2021 ? ?Reason for Consultation/Follow-up: Non pain symptom management and Pain control ? ?Subjective: ?  Awake alert, resting in bed. States that pain is well controlled today, awaiting resuming radiation from tomorrow.  ? ?Length of Stay: 2 ? ?Current Medications: ?Scheduled Meds:  ? amLODipine  5 mg Oral Daily  ?  HYDROmorphone (DILAUDID) injection  0.5 mg Intravenous Q4H  ? ipratropium-albuterol  3 mL Nebulization TID  ? metoprolol succinate  25 mg Oral Daily  ? predniSONE  40 mg Oral Q breakfast  ? ? ?Continuous Infusions: ? ? ?PRN Meds: ?acetaminophen **OR** [DISCONTINUED] acetaminophen, albuterol, dextromethorphan, feeding supplement, HYDROcodone bit-homatropine, HYDROmorphone (DILAUDID) injection, ondansetron **OR** ondansetron (ZOFRAN) IV, oxyCODONE, pantoprazole ? ?Physical Exam         ?Awake alert ?No distress ?Regular ?Coarse breath sounds ?No abdominal tenderness ? ?Vital Signs: BP 121/64   Pulse 92   Temp 98.5 ?F (36.9 ?C) (Oral)   Resp 18   Ht 5\' 9"  (1.753 m)   Wt 67.7 kg   SpO2 94%   BMI 22.05 kg/m?  ?SpO2: SpO2: 94 % ?O2 Device: O2 Device: Room Air ?O2 Flow Rate:   ? ?Intake/output summary:  ?Intake/Output Summary (Last 24 hours) at 11/18/2021 1256 ?Last data filed at 11/17/2021 1719 ?Gross per 24 hour  ?Intake 220 ml  ?Output --  ?Net 220 ml  ? ?LBM: Last BM Date : 11/15/21 ?Baseline Weight: Weight: 67.7 kg ?Most recent weight: Weight: 67.7 kg ? ?     ?Palliative Assessment/Data: ? ? ? ? ? ?Patient Active Problem List  ? Diagnosis Date Noted  ? Malnutrition of moderate degree  11/17/2021  ? Chest pain, non-cardiac 11/16/2021  ? 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[place]) 11/16/2021  ? Goals of care, counseling/discussion   ? Dyspnea 11/15/2021  ? GERD (gastroesophageal reflux disease) 11/15/2021  ? Hyponatremia 10/05/2021  ? Essential hypertension 10/05/2021  ? Right nephrolithiasis 10/04/2021  ? Hypercalcemia of malignancy 09/12/2021  ? Sinus tachycardia 09/12/2021  ? Neutropenia ([place]) 08/31/2021  ? Recent COVID-19  virus infection, possible bacterial pneumonia 08/29/2021  ? Malignant neoplasm metastatic to lung ([place]) 08/13/2021  ? Intractable pain 07/01/2021  ? Hypertension 07/01/2021  ? Tobacco use 07/01/2021  ? Acute pancreatitis 07/01/2021  ? Pleural effusion on left 01/31/2021  ? Brain metastases 01/24/2021  ? Hypercalcemia 01/15/2021  ? Kidney cancer, primary, with metastasis from kidney to other site [facility]) 12/29/2020  ? ? ?Palliative Care Assessment & Plan  ? ?Patient Profile: ?  ? ?Assessment: ? Dylan Good is a 51 yo male with PMH RCC with pulmonary mets, HTN, GERD who presented with worsening cough, SOB, and CP ?Dyspnea and cough ?Generalized pain ? ?Recommendations/Plan: ?Medication history noted, continue current opioids. To resume radiation from tomorrow.  ?Recommend palliative follow up at the cancer center as outpatient.   ?  ?Code Status: ? ?  ?Code Status Orders  ?(From admission, onward)  ?  ? ? ?  ? ?  Start     Ordered  ? 11/15/21 1529  Full code  Continuous       ? 11/15/21 1528  ? ?  ?  ? ?  ? ?Code Status History   ? ? Date Active Date Inactive Code Status Order ID Comments User Context  ? 10/04/2021 2233 10/07/2021 1854 Full Code [id number]  Etta Quill, DO ED  ? 09/12/2021 1849 09/16/2021 1613 Full Code [id number]  [name], MD ED  ? 08/28/2021 2359 09/02/2021 1625 Full Code [id number]  Kristopher Oppenheim, DO Inpatient  ? 08/28/2021 2134 08/28/2021 2359 Full Code [id number]  Kristopher Oppenheim, DO ED  ? 08/12/2021 1347 08/14/2021 2313 Full Code [id number]  Jonnie Finner, DO ED  ? 07/01/2021 0734 07/06/2021 1539 Full Code [id number]  Reubin Milan, MD ED  ? 06/19/2021 0938 06/21/2021 2313 Full Code [id number]  Jonnie Finner, DO ED  ? ?  ? ? ?Prognosis: ? Unable to determine ? ?Discharge Planning: ?To Be Determined ? ?Care plan was discussed with patient    ? ?Thank you for allowing the Palliative Medicine Team to assist in the care of this patient. ? ? ?Time In: 12 Time Out: 12.25 Total Time 25 Prolonged Time Billed  no   ? ?   ?Greater than 50%  of this time was spent counseling and coordinating care related to the above assessment and plan. ? ?Loistine Chance, MD ? ?Please contact Palliative Medicine Team phone at [phone] for questions and concerns.  ? ? ? ? ? ?

## 2021-11-18 NOTE — Plan of Care (Signed)

## 2021-11-19 ENCOUNTER — Other Ambulatory Visit: Payer: Self-pay

## 2021-11-19 ENCOUNTER — Ambulatory Visit
Admission: RE | Admit: 2021-11-19 | Discharge: 2021-11-19 | Disposition: A | Discharge status: Home / Self Care | Payer: Medicaid Other | Source: Ambulatory Visit | Attending: Radiation Oncology | Admitting: Radiation Oncology

## 2021-11-19 DIAGNOSIS — R0789 Other chest pain: Secondary | ICD-10-CM | POA: Diagnosis not present

## 2021-11-19 DIAGNOSIS — C649 Malignant neoplasm of unspecified kidney, except renal pelvis: Secondary | ICD-10-CM | POA: Diagnosis not present

## 2021-11-19 DIAGNOSIS — R0600 Dyspnea, unspecified: Secondary | ICD-10-CM | POA: Diagnosis not present

## 2021-11-19 DIAGNOSIS — R059 Cough, unspecified: Secondary | ICD-10-CM

## 2021-11-19 DIAGNOSIS — Z7189 Other specified counseling: Secondary | ICD-10-CM | POA: Diagnosis not present

## 2021-11-19 DIAGNOSIS — C7802 Secondary malignant neoplasm of left lung: Secondary | ICD-10-CM | POA: Diagnosis not present

## 2021-11-19 LAB — CBC WITH DIFFERENTIAL/PLATELET
Abs Immature Granulocytes: 0.11 10*3/uL — ABNORMAL HIGH (ref 0.00–0.07)
Basophils Absolute: 0 10*3/uL (ref 0.0–0.1)
Basophils Relative: 0 %
Eosinophils Absolute: 0.1 10*3/uL (ref 0.0–0.5)
Eosinophils Relative: 0 %
HCT: 36 % — ABNORMAL LOW (ref 39.0–52.0)
Hemoglobin: 11.1 g/dL — ABNORMAL LOW (ref 13.0–17.0)
Immature Granulocytes: 1 %
Lymphocytes Relative: 12 %
Lymphs Abs: 1.5 10*3/uL (ref 0.7–4.0)
MCH: 25.2 pg — ABNORMAL LOW (ref 26.0–34.0)
MCHC: 30.8 g/dL (ref 30.0–36.0)
MCV: 81.6 fL (ref 80.0–100.0)
Monocytes Absolute: 1.1 10*3/uL — ABNORMAL HIGH (ref 0.1–1.0)
Monocytes Relative: 9 %
Neutro Abs: 9.7 10*3/uL — ABNORMAL HIGH (ref 1.7–7.7)
Neutrophils Relative %: 78 %
Platelets: 307 10*3/uL (ref 150–400)
RBC: 4.41 MIL/uL (ref 4.22–5.81)
RDW: 17.2 % — ABNORMAL HIGH (ref 11.5–15.5)
WBC: 12.4 10*3/uL — ABNORMAL HIGH (ref 4.0–10.5)
nRBC: 0 % (ref 0.0–0.2)

## 2021-11-19 LAB — RAD ONC ARIA SESSION SUMMARY
Course Elapsed Days: 3
Plan Fractions Treated to Date: 2
Plan Prescribed Dose Per Fraction: 4 Gy
Plan Total Fractions Prescribed: 5
Plan Total Prescribed Dose: 20 Gy
Reference Point Dosage Given to Date: 8 Gy
Reference Point Session Dosage Given: 4 Gy
Session Number: 2

## 2021-11-19 LAB — BASIC METABOLIC PANEL
Anion gap: 6 (ref 5–15)
BUN: 16 mg/dL (ref 6–20)
CO2: 24 mmol/L (ref 22–32)
Calcium: 10.8 mg/dL — ABNORMAL HIGH (ref 8.9–10.3)
Chloride: 103 mmol/L (ref 98–111)
Creatinine, Ser: 0.56 mg/dL — ABNORMAL LOW (ref 0.61–1.24)
GFR, Estimated: >60 mL/min (ref >60)
Glucose, Bld: 118 mg/dL — ABNORMAL HIGH (ref 70–99)
Potassium: 3.5 mmol/L (ref 3.5–5.1)
Sodium: 133 mmol/L — ABNORMAL LOW (ref 135–145)

## 2021-11-19 LAB — MAGNESIUM: Magnesium: 1.9 mg/dL (ref 1.7–2.4)

## 2021-11-19 NOTE — Plan of Care (Signed)

## 2021-11-19 NOTE — Progress Notes (Signed)
?Progress Note ? ? ? ?[name]   ?[id number]  ?DOB: 1971-06-05  ?DOA: 11/15/2021     3 ?PCP: Pcp, No ? ?Initial CC: Shortness of breath, coughing ? ?Hospital Course: ?Mr. Rosselli is a 51 yo male with PMH RCC with pulmonary mets, HTN, GERD who presented with worsening cough, SOB, and CP.  He has had no improvement with nebs and steroids at home.  Due to ongoing symptoms, he presented for further evaluation. ?CTA chest was performed which showed extensive thoracic metastatic disease with progression from February 2023 along with mediastinal adenopathy which has invaded the right mainstem bronchus with obstruction.  No PE. ?Case was discussed with oncology who also consulted radiation oncology. ? ?Interval History:  ?No events overnight. States that the med changes have helped the cough some.  ? 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  5 fractions planned.  First radiation treatment completed on 11/16/2021 and to be resumed on 11/19/2021 ?- Oncology notes reviewed.  Treatment at this point is strictly palliative.  If patient were to worsen, hospice would not be unwarranted.  He is followed by palliative care at home he says ?-Cough is one of his bigger complaints at this time.  Continue Hycodan and Delsym.  Start on scheduled Dilaudid and also continue PRN Dilaudid.  Palliative care also following, will follow-up any further recommendations or changes; greatly appreciate assistance with symptom management ? ?Hypercalcemia of malignancy ?- suspected in setting of malignancy  ?-Calcium improved some today.  No indication for treatment ?- Continue monitoring ? ?Hypertension ?- Continue amlodipine and Toprol ? ?GERD (gastroesophageal reflux disease) ?- Continue PPI ? ?Dyspnea ?- See cancer treatment above ?- Continue breathing  treatments ? ?Chest pain, non-cardiac-resolved as of 11/19/2021 ?- Negative troponin x2 ?- No signs of ischemia on EKG ?- Known left-sided lung mass likely contributing to his symptoms ?- No further work-up indicated at this time ? ? ?Old records reviewed in assessment of this patient ? ?Antimicrobials: ? ? ?DVT prophylaxis:  ?SCDs Start: 11/15/21 1529 ? ? ?Code Status:   Code Status: Full Code ? ?Disposition Plan: Home possibly Thursday after last course of radiation ?Status is: Inpatient ? ?Objective: ?Blood pressure 102/66, pulse 85, temperature 98.2 ?F (36.8 ?C), temperature source Oral, resp. rate 15, height 5\' 9"  (1.753 m), weight 67.7 kg, SpO2 96 %.  ?Examination:  ?Physical Exam ?Constitutional:   ?   Comments: A little more comfortable appearing today.  Still coughing but a little better today  ?HENT:  ?   Head: Normocephalic and atraumatic.  ?   Mouth/Throat:  ?   Mouth: Mucous membranes are moist.  ?Eyes:  ?   Extraocular Movements: Extraocular movements intact.  ?Cardiovascular:  ?   Rate and Rhythm: Normal rate and regular rhythm.  ?   Heart sounds: Normal heart sounds.  ?Pulmonary:  ?   Comments: Diffuse coarse breath sounds bilaterally ?Abdominal:  ?   General: Bowel sounds are normal. There is no distension.  ?   Palpations: Abdomen is soft.  ?   Tenderness: There is no abdominal tenderness.  ?Musculoskeletal:     ?   General: Normal range of motion.  ?   Cervical back: Normal range of motion and neck supple.  ?Skin: ?   General: Skin is warm and dry.  ?Neurological:  ?   General: No focal  deficit present.  ?   Mental Status: He is alert.  ?Psychiatric:     ?   Mood and Affect: Mood normal.     ?   Behavior: Behavior normal.  ?  ? ?Consultants:  ?Oncology ?Radiation oncology ? ?Procedures:  ? ? ?Data Reviewed: ?Results for orders placed or performed during the hospital encounter of 11/15/21 (from the past 24 hour(s))  ?CBC with Differential/Platelet     Status: Abnormal  ? Collection Time: 11/19/21   4:55 AM  ?Result Value Ref Range  ? WBC 12.4 (H) 4.0 - 10.5 K/uL  ? RBC 4.41 4.22 - 5.81 MIL/uL  ? Hemoglobin 11.1 (L) 13.0 - 17.0 g/dL  ? HCT 36.0 (L) 39.0 - 52.0 %  ? MCV 81.6 80.0 - 100.0 fL  ? MCH 25.2 (L) 26.0 - 34.0 pg  ? MCHC 30.8 30.0 - 36.0 g/dL  ? RDW 17.2 (H) 11.5 - 15.5 %  ? Platelets 307 150 - 400 K/uL  ? nRBC 0.0 0.0 - 0.2 %  ? Neutrophils Relative % 78 %  ? Neutro Abs 9.7 (H) 1.7 - 7.7 K/uL  ? Lymphocytes Relative 12 %  ? Lymphs Abs 1.5 0.7 - 4.0 K/uL  ? Monocytes Relative 9 %  ? Monocytes Absolute 1.1 (H) 0.1 - 1.0 K/uL  ? Eosinophils Relative 0 %  ? Eosinophils Absolute 0.1 0.0 - 0.5 K/uL  ? Basophils Relative 0 %  ? Basophils Absolute 0.0 0.0 - 0.1 K/uL  ? Immature Granulocytes 1 %  ? Abs Immature Granulocytes 0.11 (H) 0.00 - 0.07 K/uL  ?Basic metabolic panel     Status: Abnormal  ? Collection Time: 11/19/21  4:55 AM  ?Result Value Ref Range  ? Sodium 133 (L) 135 - 145 mmol/L  ? Potassium 3.5 3.5 - 5.1 mmol/L  ? Chloride 103 98 - 111 mmol/L  ? CO2 24 22 - 32 mmol/L  ? Glucose, Bld 118 (H) 70 - 99 mg/dL  ? BUN 16 6 - 20 mg/dL  ? Creatinine, Ser 0.56 (L) 0.61 - 1.24 mg/dL  ? Calcium 10.8 (H) 8.9 - 10.3 mg/dL  ? GFR, Estimated >60 >60 mL/min  ? Anion gap 6 5 - 15  ?Magnesium     Status: None  ? Collection Time: 11/19/21  4:55 AM  ?Result Value Ref Range  ? Magnesium 1.9 1.7 - 2.4 mg/dL  ?  ?I have Reviewed nursing notes, Vitals, and Lab results since pt's last encounter. Pertinent lab results : see above ?I have ordered test including BMP, CBC, Mg ?I have reviewed the last note from staff over past 24 hours ?I have discussed pt's care plan and test results with nursing staff, case manager ? ? LOS: 3 days  ? ?Dwyane Dee, MD ?[facility] ?11/19/2021, 2:55 PM ?

## 2021-11-19 NOTE — Progress Notes (Signed)
? ?                                                                                                                                                     ?                                                   ?Daily Progress Note  ? ?Patient Name: Dylan Good       Date: 11/19/2021 ?DOB: 12-30-1970  Age: 51 y.o. MRN#: [id number] ?Attending Physician: Dwyane Dee, MD ?Primary Care Physician: Pcp, No ?Admit Date: 11/15/2021 ? ?Reason for Consultation/Follow-up: Non pain symptom management and Pain control ? ?Subjective: ?  Awake alert, resting in bed.  Episodic pain and cough continues, awaiting radiation today. ?Medication history noted.  Patient is on low-dose scheduled IV Dilaudid, also has as needed IV Dilaudid available.  Monitor opioid needs as the patient completes radiation.  Palliative medicine team continues to follow. ?Patient has required total of 5.5 mg of IV Dilaudid in the past 24 hours.  Continue to monitor opioid needs, patient will benefit from addition of a longer acting medication given his as needed use once his radiation is completed. ? ?Length of Stay: 3 ? ?Current Medications: ?Scheduled Meds:  ? amLODipine  5 mg Oral Daily  ?  HYDROmorphone (DILAUDID) injection  0.5 mg Intravenous Q4H  ? ipratropium-albuterol  3 mL Nebulization TID  ? metoprolol succinate  25 mg Oral Daily  ? ? ?Continuous Infusions: ? ? ?PRN Meds: ?acetaminophen **OR** [DISCONTINUED] acetaminophen, albuterol, dextromethorphan, feeding supplement, HYDROcodone bit-homatropine, HYDROmorphone (DILAUDID) injection, ondansetron **OR** ondansetron (ZOFRAN) IV, oxyCODONE, pantoprazole ?+ cough ?Physical Exam         ?Awake alert ?No distress ?Regular ?Coarse breath sounds ?No abdominal tenderness ? ?Vital Signs: BP 114/66 (BP Location: Right Arm)   Pulse 80   Temp 97.6 ?F (36.4 ?C) (Oral)   Resp 18   Ht 5\' 9"  (1.753 m)   Wt 67.7 kg   SpO2 97%   BMI 22.05 kg/m?  ?SpO2: SpO2: 97 % ?O2 Device: O2 Device: Room Air ?O2 Flow Rate:    ? ?Intake/output summary:  ?Intake/Output Summary (Last 24 hours) at 11/19/2021 1230 ?Last data filed at 11/18/2021 1300 ?Gross per 24 hour  ?Intake 120 ml  ?Output --  ?Net 120 ml  ? ? ?LBM: Last BM Date : 11/15/21 ?Baseline Weight: Weight: 67.7 kg ?Most recent weight: Weight: 67.7 kg ? ?     ?Palliative Assessment/Data: ? ? ? ? ? ?Patient Active Problem List  ? Diagnosis Date Noted  ? Malnutrition of moderate degree 11/17/2021  ? Chest pain, non-cardiac 11/16/2021  ? Metastatic renal cell carcinoma to  lung ([place]) 11/16/2021  ? 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 Goals of care, counseling/discussion   ? Dyspnea 11/15/2021  ? GERD (gastroesophageal reflux disease) 11/15/2021  ? Hyponatremia 10/05/2021  ? Essential hypertension 10/05/2021  ? Right nephrolithiasis 10/04/2021  ? Hypercalcemia of malignancy 09/12/2021  ? Sinus tachycardia 09/12/2021  ? Neutropenia ([place]) 08/31/2021  ? Recent COVID-19 virus infection, possible bacterial pneumonia 08/29/2021  ? Malignant neoplasm metastatic to lung ([place]) 08/13/2021  ? Intractable pain 07/01/2021  ? Hypertension 07/01/2021  ? Tobacco use 07/01/2021  ? Acute pancreatitis 07/01/2021  ? Pleural effusion on left 01/31/2021  ? Brain metastases 01/24/2021  ? Hypercalcemia 01/15/2021  ? Kidney cancer, primary, with metastasis from kidney to other site [facility]) 12/29/2020  ? ? ?Palliative Care Assessment & Plan  ? ?Patient Profile: ?  ? ?Assessment: ? Dylan Good is a 51 yo male with PMH RCC with pulmonary mets, HTN, GERD who presented with worsening cough, SOB, and CP ?Dyspnea and cough ?Generalized pain ? ?Recommendations/Plan: ?Medication history noted: Patient is on IV Dilaudid both scheduled and as needed, will likely benefit from addition of a long-acting opioid, monitor effects of radiation on his symptoms of pain and cough. ?Recommend palliative follow up at the cancer center as outpatient.   ?  ?Code Status: ? ?  ?Code  Status Orders  ?(From admission, onward)  ?  ? ? ?  ? ?  Start     Ordered  ? 11/15/21 1529  Full code  Continuous       ? 11/15/21 1528  ? ?  ?  ? ?  ? ?Code Status History   ? ? Date Active Date Inactive Code Status Order ID Comments User Context  ? 10/04/2021 2233 10/07/2021 1854 Full Code [id number]  Etta Quill, DO ED  ? 09/12/2021 1849 09/16/2021 1613 Full Code [id number]  [name], MD ED  ? 08/28/2021 2359 09/02/2021 1625 Full Code [id number]  Kristopher Oppenheim, DO Inpatient  ? 08/28/2021 2134 08/28/2021 2359 Full Code [id number]  Kristopher Oppenheim, DO ED  ? 08/12/2021 1347 08/14/2021 2313 Full Code [id number]  Jonnie Finner, DO ED  ? 07/01/2021 0734 07/06/2021 1539 Full Code [id number]  Reubin Milan, MD ED  ? 06/19/2021 0938 06/21/2021 2313 Full Code [id number]  Jonnie Finner, DO ED  ? ?  ? ? ?Prognosis: ? Unable to determine ? ?Discharge Planning: ?To Be Determined ? ?Care plan was discussed with patient    ? ?Thank you for allowing the Palliative Medicine Team to assist in the care of this patient. ? ? ?Time In: 12 Time Out: 12.25 Total Time 25 Prolonged Time Billed  no   ? ?   ?Greater than 50%  of this time was spent counseling and coordinating care related to the above assessment and plan. ? ?Loistine Chance, MD ? ?Please contact Palliative Medicine Team phone at [phone] for questions and concerns.  ? ? ? ? ? ?

## 2021-11-20 ENCOUNTER — Ambulatory Visit
Admission: RE | Admit: 2021-11-20 | Discharge: 2021-11-20 | Disposition: A | Discharge status: Home / Self Care | Payer: Medicaid Other | Source: Ambulatory Visit | Attending: Radiation Oncology | Admitting: Radiation Oncology

## 2021-11-20 ENCOUNTER — Other Ambulatory Visit: Payer: Self-pay

## 2021-11-20 DIAGNOSIS — Z7189 Other specified counseling: Secondary | ICD-10-CM | POA: Diagnosis not present

## 2021-11-20 DIAGNOSIS — C649 Malignant neoplasm of unspecified kidney, except renal pelvis: Secondary | ICD-10-CM | POA: Diagnosis not present

## 2021-11-20 DIAGNOSIS — G893 Neoplasm related pain (acute) (chronic): Secondary | ICD-10-CM

## 2021-11-20 DIAGNOSIS — K5903 Drug induced constipation: Secondary | ICD-10-CM | POA: Diagnosis not present

## 2021-11-20 DIAGNOSIS — T402X5A Adverse effect of other opioids, initial encounter: Secondary | ICD-10-CM

## 2021-11-20 DIAGNOSIS — C7802 Secondary malignant neoplasm of left lung: Secondary | ICD-10-CM | POA: Diagnosis not present

## 2021-11-20 LAB — BASIC METABOLIC PANEL
Anion gap: 7 (ref 5–15)
BUN: 15 mg/dL (ref 6–20)
CO2: 24 mmol/L (ref 22–32)
Calcium: 10.9 mg/dL — ABNORMAL HIGH (ref 8.9–10.3)
Chloride: 103 mmol/L (ref 98–111)
Creatinine, Ser: 0.53 mg/dL — ABNORMAL LOW (ref 0.61–1.24)
GFR, Estimated: >60 mL/min (ref >60)
Glucose, Bld: 103 mg/dL — ABNORMAL HIGH (ref 70–99)
Potassium: 3.8 mmol/L (ref 3.5–5.1)
Sodium: 134 mmol/L — ABNORMAL LOW (ref 135–145)

## 2021-11-20 LAB — CBC WITH DIFFERENTIAL/PLATELET
Abs Immature Granulocytes: 0.1 10*3/uL — ABNORMAL HIGH (ref 0.00–0.07)
Basophils Absolute: 0 10*3/uL (ref 0.0–0.1)
Basophils Relative: 0 %
Eosinophils Absolute: 0.1 10*3/uL (ref 0.0–0.5)
Eosinophils Relative: 1 %
HCT: 35 % — ABNORMAL LOW (ref 39.0–52.0)
Hemoglobin: 11.3 g/dL — ABNORMAL LOW (ref 13.0–17.0)
Immature Granulocytes: 1 %
Lymphocytes Relative: 11 %
Lymphs Abs: 1.4 10*3/uL (ref 0.7–4.0)
MCH: 26 pg (ref 26.0–34.0)
MCHC: 32.3 g/dL (ref 30.0–36.0)
MCV: 80.6 fL (ref 80.0–100.0)
Monocytes Absolute: 1.1 10*3/uL — ABNORMAL HIGH (ref 0.1–1.0)
Monocytes Relative: 9 %
Neutro Abs: 9.9 10*3/uL — ABNORMAL HIGH (ref 1.7–7.7)
Neutrophils Relative %: 78 %
Platelets: 289 10*3/uL (ref 150–400)
RBC: 4.34 MIL/uL (ref 4.22–5.81)
RDW: 17.1 % — ABNORMAL HIGH (ref 11.5–15.5)
WBC: 12.6 10*3/uL — ABNORMAL HIGH (ref 4.0–10.5)
nRBC: 0 % (ref 0.0–0.2)

## 2021-11-20 LAB — RAD ONC ARIA SESSION SUMMARY
Course Elapsed Days: 4
Plan Fractions Treated to Date: 3
Plan Prescribed Dose Per Fraction: 4 Gy
Plan Total Fractions Prescribed: 5
Plan Total Prescribed Dose: 20 Gy
Reference Point Dosage Given to Date: 12 Gy
Reference Point Session Dosage Given: 4 Gy
Session Number: 3

## 2021-11-20 LAB — MAGNESIUM: Magnesium: 2 mg/dL (ref 1.7–2.4)

## 2021-11-20 MED ORDER — POLYETHYLENE GLYCOL 3350 17 G PO PACK
17.0000 g | PACK | Freq: Every day | ORAL | Status: DC
  Administered 2021-11-20 – 2021-11-22 (×3): 17 g via ORAL
  Filled 2021-11-20 (×3): qty 1

## 2021-11-20 MED ORDER — ALBUTEROL SULFATE HFA 108 (90 BASE) MCG/ACT IN AERS: 1.0000 | INHALATION_SPRAY | RESPIRATORY_TRACT | Status: DC | PRN

## 2021-11-20 NOTE — Progress Notes (Signed)
? ?                                                                                                                                                     ?                                                   ?Daily Progress Note  ? ?Patient Name: Dylan Good       Date: 11/20/2021 ?DOB: April 20, 1971  Age: 51 y.o. MRN#: [id number] ?Attending Physician: Dwyane Dee, MD ?Primary Care Physician: Pcp, No ?Admit Date: 11/15/2021 ? ?Reason for Consultation/Follow-up: Non pain symptom management and Pain control ? ?Subjective: ?I saw and examined Dylan Good today.  At time of my encounter, he was lying on window couch in his room.  Reports he is having "okay" day today.  Pain is currently well controlled. ? ?MAR reviewed and he has had a total of 4.5 mg of Dilaudid in the last 24 hours.  Discussed with him about need to rotate off of IV Dilaudid to something that he can transition home with in the next day or so.  He stated preference to wait till tomorrow to begin this process.  We did discuss that if between long and short acting opioids and plan to initiate long-acting opioid likely tomorrow. ? ?We discussed constipation he reports is been several days since his last bowel movement.  Discussed plan to start MiraLAX. ? ?He continues to cough.  Finds that cough medications and also albuterol have been helpful for this.  He thinks that the inhaler he has at home was more effective than nebulized treatments and I restarted inhaler per his request. ? ?Length of Stay: 4 ? ?Current Medications: ?Scheduled Meds:  ? amLODipine  5 mg Oral Daily  ?  HYDROmorphone (DILAUDID) injection  0.5 mg Intravenous Q4H  ? ipratropium-albuterol  3 mL Nebulization TID  ? metoprolol succinate  25 mg Oral Daily  ? polyethylene glycol  17 g Oral Daily  ? ? ?Continuous Infusions: ? ? ?PRN Meds: ?acetaminophen **OR** [DISCONTINUED] acetaminophen, albuterol, dextromethorphan, feeding supplement, HYDROcodone bit-homatropine, HYDROmorphone (DILAUDID)  injection, ondansetron **OR** ondansetron (ZOFRAN) IV, oxyCODONE, pantoprazole ?+ cough ?Physical Exam         ?Awake alert ?No distress ?Regular ?Coarse breath sounds ?No abdominal tenderness ? ?Vital Signs: BP 111/62 (BP Location: Left Arm)   Pulse 87   Temp (!) 97.4 ?F (36.3 ?C) (Oral)   Resp 14   Ht 5\' 9"  (1.753 m)   Wt 67.7 kg   SpO2 97%   BMI 22.05 kg/m?  ?SpO2: SpO2: 97 % ?O2 Device: O2 Device: Room Air ?O2 Flow Rate:   ? ?  Intake/output summary:  ?Intake/Output Summary (Last 24 hours) at 11/20/2021 1535 ?Last data filed at 11/20/2021 0600 ?Gross per 24 hour  ?Intake 120 ml  ?Output --  ?Net 120 ml  ? ? ?LBM: Last BM Date : 11/15/21 ?Baseline Weight: Weight: 67.7 kg ?Most recent weight: Weight: 67.7 kg ? ?     ?Palliative Assessment/Data: ? ? ? ? ? ?Patient Active Problem List  ? Diagnosis Date Noted  ? Right nephrolithiasis 10/04/2021  ? Acute pancreatitis 07/01/2021  ? 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 Kidney cancer, primary, with metastasis from kidney to other site [facility]) 12/29/2020  ? Malignant neoplasm metastatic to lung ([place]) 08/13/2021  ? Pleural effusion on left 01/31/2021  ? Brain metastases 01/24/2021  ? Hypercalcemia of malignancy 09/12/2021  ? Hypertension 07/01/2021  ? Recent COVID-19 virus infection, possible bacterial pneumonia 08/29/2021  ? Sinus tachycardia 09/12/2021  ? Malnutrition of moderate degree 11/17/2021  ? 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 Goals of care, counseling/discussion   ? Dyspnea 11/15/2021  ? GERD (gastroesophageal reflux disease) 11/15/2021  ? Hyponatremia 10/05/2021  ? Essential hypertension 10/05/2021  ? Neutropenia ([place]) 08/31/2021  ? Intractable pain 07/01/2021  ? Tobacco use 07/01/2021  ? Hypercalcemia 01/15/2021  ? ? ?Palliative Care Assessment & Plan  ? ?Patient Profile: ?  ? ?Assessment: ? Dylan Good is a 51 yo male with PMH RCC with pulmonary mets, HTN, GERD who presented  with worsening cough, SOB, and CP ?Dyspnea and cough ?Generalized pain ? ?Recommendations/Plan: ?Pain, cancer related: Continue with scheduled and as needed Dilaudid today.  Discussed plan to initiate long-acting agent tomorrow.  MAR reveals total usage today of 4.5 mg which will be equivalent of 90 mg of oral morphine.  This is a decrease from the day before which is why we are waiting to start long-acting agent until tomorrow.  Will likely initiate MS Contin vs OxyContin tomorrow.  Discussion today regarding education for opioids and cancer related pain. ?Constipation, opioid related: Begin MiraLAX.  If no result tomorrow we will also add on senna. ?Recommend palliative follow up at the cancer center as outpatient.   ?  ?Code Status: ? ?  ?Code Status Orders  ?(From admission, onward)  ?  ? ? ?  ? ?  Start     Ordered  ? 11/15/21 1529  Full code  Continuous       ? 11/15/21 1528  ? ?  ?  ? ?  ? ?Code Status History   ? ? Date Active Date Inactive Code Status Order ID Comments User Context  ? 10/04/2021 2233 10/07/2021 1854 Full Code [id number]  Etta Quill, DO ED  ? 09/12/2021 1849 09/16/2021 1613 Full Code [id number]  [name], MD ED  ? 08/28/2021 2359 09/02/2021 1625 Full Code [id number]  Kristopher Oppenheim, DO Inpatient  ? 08/28/2021 2134 08/28/2021 2359 Full Code [id number]  Kristopher Oppenheim, DO ED  ? 08/12/2021 1347 08/14/2021 2313 Full Code [id number]  Jonnie Finner, DO ED  ? 07/01/2021 0734 07/06/2021 1539 Full Code [id number]  Reubin Milan, MD ED  ? 06/19/2021 0938 06/21/2021 2313 Full Code [id number]  Jonnie Finner, DO ED  ? ?  ? ? ?Prognosis: ? Unable to determine ? ?Discharge Planning: ?To Be Determined ? ?Care plan was discussed with patient    ? ?Thank you for allowing the Palliative Medicine Team to assist in the care of this patient. ? ?Total  time: 50 minutes ? ?Micheline Rough, MD ? ?Please contact Palliative Medicine Team phone at [phone] for questions and concerns.  ? ? ? ? ? ?

## 2021-11-20 NOTE — Progress Notes (Signed)
?Progress Note ? ? ? ?Dylan Good   ?[id number]  ?DOB: 06-Jan-1971  ?DOA: 11/15/2021     4 ?PCP: Pcp, No ? ?Initial CC: Shortness of breath, coughing ? ?Hospital Course: ?Mr. Dylan Good is a 51 yo male with PMH RCC with pulmonary mets, HTN, GERD who presented with worsening cough, SOB, and CP.  He has had no improvement with nebs and steroids at home.  Due to ongoing symptoms, he presented for further evaluation. ?CTA chest was performed which showed extensive thoracic metastatic disease with progression from February 2023 along with mediastinal adenopathy which has invaded the right mainstem bronchus with obstruction.  No PE. ?Case was discussed with oncology who also consulted radiation oncology. ? ?Interval History:  ?No events overnight. Seems to be tolerating the radiation. He was resting more comfortably today.  ? 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  5 fractions planned.  First radiation treatment completed on 11/16/2021 and to be resumed on 11/19/2021 ?- Oncology notes reviewed.  Treatment at this point is strictly palliative.  If patient were to worsen, hospice would not be unwarranted.  He is followed by palliative care at home he says ?-Cough is one of his bigger complaints at this time.  Continue Hycodan and Delsym.  Start on scheduled Dilaudid and also continue PRN Dilaudid.  Palliative care also following, will follow-up any further recommendations or changes; greatly appreciate assistance with symptom management ? ?Hypercalcemia of malignancy ?- suspected in setting of malignancy  ?- Calcium improved some today.  No indication for treatment ?- Continue monitoring ? ?Hypertension ?- Continue amlodipine and Toprol ? ?GERD (gastroesophageal reflux disease) ?- Continue PPI ? ?Dyspnea ?- See cancer treatment above ?- Continue  breathing treatments ? ?Chest pain, non-cardiac-resolved as of 11/19/2021 ?- Negative troponin x2 ?- No signs of ischemia on EKG ?- Known left-sided lung mass likely contributing to his symptoms ?- No further work-up indicated at this time ? ? ?Old records reviewed in assessment of this patient ? ?Antimicrobials: ? ? ?DVT prophylaxis:  ?SCDs Start: 11/15/21 1529 ? ? ?Code Status:   Code Status: Full Code ? ?Disposition Plan: Home possibly Thursday after last course of radiation ?Status is: Inpatient ? ?Objective: ?Blood pressure 111/62, pulse 87, temperature (!) 97.4 ?F (36.3 ?C), temperature source Oral, resp. rate 14, height 5\' 9"  (1.753 m), weight 67.7 kg, SpO2 97 %.  ?Examination:  ?Physical Exam ?Constitutional:   ?   Comments: A little more comfortable appearing today.  Still coughing but a little better today  ?HENT:  ?   Head: Normocephalic and atraumatic.  ?   Mouth/Throat:  ?   Mouth: Mucous membranes are moist.  ?Eyes:  ?   Extraocular Movements: Extraocular movements intact.  ?Cardiovascular:  ?   Rate and Rhythm: Normal rate and regular rhythm.  ?   Heart sounds: Normal heart sounds.  ?Pulmonary:  ?   Comments: Diffuse coarse breath sounds bilaterally ?Abdominal:  ?   General: Bowel sounds are normal. There is no distension.  ?   Palpations: Abdomen is soft.  ?   Tenderness: There is no abdominal tenderness.  ?Musculoskeletal:     ?   General: Normal range of motion.  ?   Cervical back: Normal range of motion and neck supple.  ?Skin: ?   General: Skin is warm and dry.  ?Neurological:  ?  General: No focal deficit present.  ?   Mental Status: He is alert.  ?Psychiatric:     ?   Mood and Affect: Mood normal.     ?   Behavior: Behavior normal.  ?  ? ?Consultants:  ?Oncology ?Radiation oncology ? ?Procedures:  ? ? ?Data Reviewed: ?Results for orders placed or performed during the hospital encounter of 11/15/21 (from the past 24 hour(s))  ?CBC with Differential/Platelet     Status: Abnormal  ? Collection  Time: 11/20/21  5:33 AM  ?Result Value Ref Range  ? WBC 12.6 (H) 4.0 - 10.5 K/uL  ? RBC 4.34 4.22 - 5.81 MIL/uL  ? Hemoglobin 11.3 (L) 13.0 - 17.0 g/dL  ? HCT 35.0 (L) 39.0 - 52.0 %  ? MCV 80.6 80.0 - 100.0 fL  ? MCH 26.0 26.0 - 34.0 pg  ? MCHC 32.3 30.0 - 36.0 g/dL  ? RDW 17.1 (H) 11.5 - 15.5 %  ? Platelets 289 150 - 400 K/uL  ? nRBC 0.0 0.0 - 0.2 %  ? Neutrophils Relative % 78 %  ? Neutro Abs 9.9 (H) 1.7 - 7.7 K/uL  ? Lymphocytes Relative 11 %  ? Lymphs Abs 1.4 0.7 - 4.0 K/uL  ? Monocytes Relative 9 %  ? Monocytes Absolute 1.1 (H) 0.1 - 1.0 K/uL  ? Eosinophils Relative 1 %  ? Eosinophils Absolute 0.1 0.0 - 0.5 K/uL  ? Basophils Relative 0 %  ? Basophils Absolute 0.0 0.0 - 0.1 K/uL  ? Immature Granulocytes 1 %  ? Abs Immature Granulocytes 0.10 (H) 0.00 - 0.07 K/uL  ?Basic metabolic panel     Status: Abnormal  ? Collection Time: 11/20/21  5:33 AM  ?Result Value Ref Range  ? Sodium 134 (L) 135 - 145 mmol/L  ? Potassium 3.8 3.5 - 5.1 mmol/L  ? Chloride 103 98 - 111 mmol/L  ? CO2 24 22 - 32 mmol/L  ? Glucose, Bld 103 (H) 70 - 99 mg/dL  ? BUN 15 6 - 20 mg/dL  ? Creatinine, Ser 0.53 (L) 0.61 - 1.24 mg/dL  ? Calcium 10.9 (H) 8.9 - 10.3 mg/dL  ? GFR, Estimated >60 >60 mL/min  ? Anion gap 7 5 - 15  ?Magnesium     Status: None  ? Collection Time: 11/20/21  5:33 AM  ?Result Value Ref Range  ? Magnesium 2.0 1.7 - 2.4 mg/dL  ?  ?I have Reviewed nursing notes, Vitals, and Lab results since pt's last encounter. Pertinent lab results : see above ?I have ordered test including BMP, CBC, Mg ?I have reviewed the last note from staff over past 24 hours ?I have discussed pt's care plan and test results with nursing staff, case manager ? ? LOS: 4 days  ? ?Dwyane Dee, MD ?[facility] ?11/20/2021, 3:45 PM ?

## 2021-11-20 NOTE — Plan of Care (Signed)

## 2021-11-21 ENCOUNTER — Telehealth: Payer: Self-pay | Admitting: Oncology

## 2021-11-21 ENCOUNTER — Ambulatory Visit
Admission: RE | Admit: 2021-11-21 | Discharge: 2021-11-21 | Disposition: A | Discharge status: Home / Self Care | Payer: Medicaid Other | Source: Ambulatory Visit | Attending: Radiation Oncology | Admitting: Radiation Oncology

## 2021-11-21 ENCOUNTER — Other Ambulatory Visit: Payer: Self-pay

## 2021-11-21 DIAGNOSIS — Z515 Encounter for palliative care: Secondary | ICD-10-CM

## 2021-11-21 DIAGNOSIS — C7802 Secondary malignant neoplasm of left lung: Secondary | ICD-10-CM | POA: Diagnosis not present

## 2021-11-21 DIAGNOSIS — G893 Neoplasm related pain (acute) (chronic): Secondary | ICD-10-CM | POA: Diagnosis not present

## 2021-11-21 DIAGNOSIS — K5903 Drug induced constipation: Secondary | ICD-10-CM | POA: Diagnosis not present

## 2021-11-21 LAB — RAD ONC ARIA SESSION SUMMARY
Course Elapsed Days: 5
Plan Fractions Treated to Date: 4
Plan Prescribed Dose Per Fraction: 4 Gy
Plan Total Fractions Prescribed: 5
Plan Total Prescribed Dose: 20 Gy
Reference Point Dosage Given to Date: 16 Gy
Reference Point Session Dosage Given: 4 Gy
Session Number: 4

## 2021-11-21 LAB — CBC WITH DIFFERENTIAL/PLATELET
Abs Immature Granulocytes: 0.12 10*3/uL — ABNORMAL HIGH (ref 0.00–0.07)
Basophils Absolute: 0 10*3/uL (ref 0.0–0.1)
Basophils Relative: 0 %
Eosinophils Absolute: 0.3 10*3/uL (ref 0.0–0.5)
Eosinophils Relative: 2 %
HCT: 36.1 % — ABNORMAL LOW (ref 39.0–52.0)
Hemoglobin: 11.5 g/dL — ABNORMAL LOW (ref 13.0–17.0)
Immature Granulocytes: 1 %
Lymphocytes Relative: 8 %
Lymphs Abs: 1 10*3/uL (ref 0.7–4.0)
MCH: 25.7 pg — ABNORMAL LOW (ref 26.0–34.0)
MCHC: 31.9 g/dL (ref 30.0–36.0)
MCV: 80.8 fL (ref 80.0–100.0)
Monocytes Absolute: 1.1 10*3/uL — ABNORMAL HIGH (ref 0.1–1.0)
Monocytes Relative: 9 %
Neutro Abs: 9.4 10*3/uL — ABNORMAL HIGH (ref 1.7–7.7)
Neutrophils Relative %: 80 %
Platelets: 315 10*3/uL (ref 150–400)
RBC: 4.47 MIL/uL (ref 4.22–5.81)
RDW: 16.8 % — ABNORMAL HIGH (ref 11.5–15.5)
WBC: 12 10*3/uL — ABNORMAL HIGH (ref 4.0–10.5)
nRBC: 0 % (ref 0.0–0.2)

## 2021-11-21 LAB — BASIC METABOLIC PANEL
Anion gap: 7 (ref 5–15)
BUN: 15 mg/dL (ref 6–20)
CO2: 23 mmol/L (ref 22–32)
Calcium: 10.8 mg/dL — ABNORMAL HIGH (ref 8.9–10.3)
Chloride: 104 mmol/L (ref 98–111)
Creatinine, Ser: 0.69 mg/dL (ref 0.61–1.24)
GFR, Estimated: >60 mL/min (ref >60)
Glucose, Bld: 93 mg/dL (ref 70–99)
Potassium: 4.3 mmol/L (ref 3.5–5.1)
Sodium: 134 mmol/L — ABNORMAL LOW (ref 135–145)

## 2021-11-21 LAB — MAGNESIUM: Magnesium: 2 mg/dL (ref 1.7–2.4)

## 2021-11-21 MED ORDER — MORPHINE SULFATE ER 15 MG PO TBCR
15.0000 mg | EXTENDED_RELEASE_TABLET | Freq: Two times a day (BID) | ORAL | Status: DC
  Administered 2021-11-21 – 2021-11-22 (×3): 15 mg via ORAL
  Filled 2021-11-21 (×3): qty 1

## 2021-11-21 NOTE — Progress Notes (Signed)
?Progress Note ? ? ? ?Dylan Good   ?[id number]  ?DOB: [birth year]  ?DOA: 11/15/2021     5 ?PCP: Pcp, No ? ?Initial CC: Shortness of breath, coughing in the context of metastatic cancer. ? ?Hospital Course: ?Dylan Good is a 51 yo male with PMH RCC with pulmonary mets, HTN, GERD who presented with worsening cough, SOB, and CP.  He has had no improvement with nebs and steroids at home.  Due to ongoing symptoms, he presented for further evaluation. ?CTA chest was performed which showed extensive thoracic metastatic disease with progression from February 2023 along with mediastinal adenopathy which has invaded the right mainstem bronchus with obstruction.  No PE. ?Case was discussed with oncology who also consulted radiation oncology and currently undergoing radiation therapies. ? ?Interval History:  ?Seen and examined.  No overnight events.  Dry cough persist.  Agreeable to try oral medications today.  Discussed with palliative care. ? 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  5 fractions planned.  First radiation treatment completed on 11/16/2021 and to be resumed on 11/19/2021 ?- Oncology notes reviewed.  Treatment at this point is strictly palliative.  If patient were to worsen, hospice will be appropriate.  Followed by palliative care at home.   ?-We will complete radiation therapy 5/18, optimal pain management with the help of palliative care team today.   ? ?Hypercalcemia of malignancy ?- suspected in setting of malignancy  ?- Calcium improved some today.  No indication for treatment ?- Continue monitoring ? ?Hypertension ?- Continue amlodipine and Toprol ? ?GERD (gastroesophageal reflux disease) ?- Continue PPI ? ?Dyspnea ?- See cancer treatment above ?- Continue breathing treatments ? ?Chest pain, non-cardiac-resolved as of  11/19/2021 ?- Negative troponin x2 ?- No signs of ischemia on EKG ?- Known left-sided lung mass likely contributing to his symptoms ?- No further work-up indicated at this time ? ? ? ? ?Antimicrobials: None. ? ? ?DVT prophylaxis:  ?SCDs Start: 11/15/21 1529 ? ? ?Code Status:   Code Status: Full Code ? ?Disposition Plan: Home possibly tomorrow after last course of radiation therapy. ?Status is: Inpatient ? ?Objective: ?Blood pressure 119/68, pulse (!) 102, temperature 99.6 ?F (37.6 ?C), temperature source Oral, resp. rate 16, height 5\' 9"  (1.753 m), weight 67.7 kg, SpO2 93 %.  ?Examination:  ? ?General: Chronically sick looking.  Ongoing cough with some distress.  Looks comfortable when resting. ?Cardiovascular: S1-S2 normal.  Regular rate rhythm. ?Respiratory: Bilateral coarse conducted airway sounds.  Dry hacking cough. ?Gastrointestinal: Soft.  Nontender.  Bowel sound present.  Distended but nontender. ?Ext: No edema or swelling.  No cyanosis. ?Neuro: Intact. ?Musculoskeletal: No deformities. ?Skin: Intact. ?Psych: Normal mood. ? ? ? ?Consultants:  ?Oncology ?Radiation oncology ? ?Procedures:  ?None. ? ?Data Reviewed: ?Results for orders placed or performed during the hospital encounter of 11/15/21 (from the past 24 hour(s))  ?CBC with Differential/Platelet     Status: Abnormal  ? Collection Time: 11/21/21  5:31 AM  ?Result Value Ref Range  ? WBC 12.0 (H) 4.0 - 10.5 K/uL  ? RBC 4.47 4.22 - 5.81 MIL/uL  ? Hemoglobin 11.5 (L) 13.0 - 17.0 g/dL  ? HCT 36.1 (L) 39.0 - 52.0 %  ? MCV 80.8 80.0 - 100.0 fL  ? MCH 25.7 (L) 26.0 - 34.0 pg  ? MCHC 31.9 30.0 - 36.0 g/dL  ? RDW 16.8 (H)  11.5 - 15.5 %  ? Platelets 315 150 - 400 K/uL  ? nRBC 0.0 0.0 - 0.2 %  ? Neutrophils Relative % 80 %  ? Neutro Abs 9.4 (H) 1.7 - 7.7 K/uL  ? Lymphocytes Relative 8 %  ? Lymphs Abs 1.0 0.7 - 4.0 K/uL  ? Monocytes Relative 9 %  ? Monocytes Absolute 1.1 (H) 0.1 - 1.0 K/uL  ? Eosinophils Relative 2 %  ? Eosinophils Absolute 0.3 0.0 - 0.5 K/uL  ?  Basophils Relative 0 %  ? Basophils Absolute 0.0 0.0 - 0.1 K/uL  ? Immature Granulocytes 1 %  ? Abs Immature Granulocytes 0.12 (H) 0.00 - 0.07 K/uL  ?Basic metabolic panel     Status: Abnormal  ? Collection Time: 11/21/21  5:31 AM  ?Result Value Ref Range  ? Sodium 134 (L) 135 - 145 mmol/L  ? Potassium 4.3 3.5 - 5.1 mmol/L  ? Chloride 104 98 - 111 mmol/L  ? CO2 23 22 - 32 mmol/L  ? Glucose, Bld 93 70 - 99 mg/dL  ? BUN 15 6 - 20 mg/dL  ? Creatinine, Ser 0.69 0.61 - 1.24 mg/dL  ? Calcium 10.8 (H) 8.9 - 10.3 mg/dL  ? GFR, Estimated >60 >60 mL/min  ? Anion gap 7 5 - 15  ?Magnesium     Status: None  ? Collection Time: 11/21/21  5:31 AM  ?Result Value Ref Range  ? Magnesium 2.0 1.7 - 2.4 mg/dL  ?  ? ? ? LOS: 5 days  ? ?Barb Merino, MD ?[facility] ?11/21/2021, 1:03 PM ?

## 2021-11-21 NOTE — Telephone Encounter (Signed)
Called patient regarding upcoming appointment, left a voicemail. 

## 2021-11-21 NOTE — Progress Notes (Addendum)
? ?                                                                                                                                                     ?                                                   ?Daily Progress Note  ? ?Patient Name: Dylan Good       Date: 11/21/2021 ?DOB: 11-30-1970  Age: 51 y.o. MRN#: [id number] ?Attending Physician: Barb Merino, MD ?Primary Care Physician: Pcp, No ?Admit Date: 11/15/2021 ? ?Reason for Consultation/Follow-up: Non pain symptom management and Pain control ? ?Subjective: ?I saw and examined Dylan Good today.  He was lying in bed in no distress.  Reports pain currently controlled. ? ?MAR reviewed and he has had a total of 4 mg of Dilaudid in the last 24 hours.  Discussed plan for initiation of long acting agent and stopping scheduled dilaudid.  He is agreeable and discussed use of MS Contin as this may also help with cough and he has no significant renal or hepatic impairment. ? ?We discussed constipation he reports is been several days since his last bowel movement.  Miralax started yesterday and will add senna today. ? ?Length of Stay: 5 ? ?Current Medications: ?Scheduled Meds:  ? amLODipine  5 mg Oral Daily  ? ipratropium-albuterol  3 mL Nebulization TID  ? metoprolol succinate  25 mg Oral Daily  ? morphine  15 mg Oral Q12H  ? polyethylene glycol  17 g Oral Daily  ? ? ?Continuous Infusions: ? ? ?PRN Meds: ?acetaminophen **OR** [DISCONTINUED] acetaminophen, albuterol, dextromethorphan, feeding supplement, HYDROcodone bit-homatropine, HYDROmorphone (DILAUDID) injection, ondansetron **OR** ondansetron (ZOFRAN) IV, oxyCODONE, pantoprazole ?+ cough ?Physical Exam         ?Awake alert ?No distress ?Regular ?Coarse breath sounds ?No abdominal tenderness ? ?Vital Signs: BP 119/68 (BP Location: Right Arm)   Pulse (!) 102   Temp 99.6 ?F (37.6 ?C) (Oral)   Resp 16   Ht 5\' 9"  (1.753 m)   Wt 67.7 kg   SpO2 93%   BMI 22.05 kg/m?  ?SpO2: SpO2: 93 % ?O2 Device: O2 Device: Room  Air ?O2 Flow Rate:   ? ?Intake/output summary:  ?Intake/Output Summary (Last 24 hours) at 11/21/2021 1010 ?Last data filed at 11/21/2021 0700 ?Gross per 24 hour  ?Intake 240 ml  ?Output --  ?Net 240 ml  ? ? ?LBM: Last BM Date : 11/14/21 ?Baseline Weight: Weight: 67.7 kg ?Most recent weight: Weight: 67.7 kg ? ?     ?Palliative Assessment/Data: ? ? ? ? ? ?Patient Active Problem List  ? Diagnosis Date Noted  ?  Right nephrolithiasis 10/04/2021  ? Acute pancreatitis 07/01/2021  ? 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 Kidney cancer, primary, with metastasis from kidney to other site [facility]) 12/29/2020  ? Malignant neoplasm metastatic to lung ([place]) 08/13/2021  ? Pleural effusion on left 01/31/2021  ? Brain metastases 01/24/2021  ? Hypercalcemia of malignancy 09/12/2021  ? Hypertension 07/01/2021  ? Recent COVID-19 virus infection, possible bacterial pneumonia 08/29/2021  ? Sinus tachycardia 09/12/2021  ? Malnutrition of moderate degree 11/17/2021  ? 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 Goals of care, counseling/discussion   ? Dyspnea 11/15/2021  ? GERD (gastroesophageal reflux disease) 11/15/2021  ? Hyponatremia 10/05/2021  ? Essential hypertension 10/05/2021  ? Neutropenia ([place]) 08/31/2021  ? Intractable pain 07/01/2021  ? Tobacco use 07/01/2021  ? Hypercalcemia 01/15/2021  ? ? ?Palliative Care Assessment & Plan  ? ?Patient Profile: ?  ? ?Assessment: ? Dylan Good is a 51 yo male with PMH RCC with pulmonary mets, HTN, GERD who presented with worsening cough, SOB, and CP ?Dyspnea and cough ?Generalized pain ? ?Recommendations/Plan: ?Pain, cancer related: MAR reviewed and he has had 4mg  of IV dilaudid which is the equivalent of 80mg  of oral morphine.  Will plan to start MS Contin 15mg  twice daily.  Continue oxycodone 5mg  as needed for breakthrough pain with backup IV dilaudid as well for rescue medication.  With his needs, may need to further  uptitrate the MS Contin, but we discussed plan to start low and use more short acting medication and continue to titrate. ?Constipation, opioid related: Continue miralax.  Add senna ?Recommend palliative follow up at the cancer center as outpatient.   ?  ?Code Status: ? ?  ?Code Status Orders  ?(From admission, onward)  ?  ? ? ?  ? ?  Start     Ordered  ? 11/15/21 1529  Full code  Continuous       ? 11/15/21 1528  ? ?  ?  ? ?  ? ?Code Status History   ? ? Date Active Date Inactive Code Status Order ID Comments User Context  ? 10/04/2021 2233 10/07/2021 1854 Full Code [id number]  Etta Quill, DO ED  ? 09/12/2021 1849 09/16/2021 1613 Full Code [id number]  [name], MD ED  ? 08/28/2021 2359 09/02/2021 1625 Full Code [id number]  Kristopher Oppenheim, DO Inpatient  ? 08/28/2021 2134 08/28/2021 2359 Full Code [id number]  Kristopher Oppenheim, DO ED  ? 08/12/2021 1347 08/14/2021 2313 Full Code [id number]  Jonnie Finner, DO ED  ? 07/01/2021 0734 07/06/2021 1539 Full Code [id number]  Reubin Milan, MD ED  ? 06/19/2021 0938 06/21/2021 2313 Full Code [id number]  Jonnie Finner, DO ED  ? ?  ? ? ?Prognosis: ? Unable to determine ? ?Discharge Planning: ?To Be Determined ? ?Care plan was discussed with patient    ? ?Thank you for allowing the Palliative Medicine Team to assist in the care of this patient. ? ?Total time: 40 minutes ? ?Micheline Rough, MD ? ?Please contact Palliative Medicine Team phone at [phone] for questions and concerns.  ? ? ? ? ? ?

## 2021-11-22 ENCOUNTER — Telehealth: Payer: Self-pay | Admitting: *Deleted

## 2021-11-22 ENCOUNTER — Inpatient Hospital Stay: Admit: 2021-11-22 | Payer: Medicaid Other

## 2021-11-22 ENCOUNTER — Other Ambulatory Visit: Payer: Self-pay

## 2021-11-22 ENCOUNTER — Ambulatory Visit
Admission: RE | Admit: 2021-11-22 | Discharge: 2021-11-22 | Disposition: A | Discharge status: Home / Self Care | Payer: Medicaid Other | Source: Ambulatory Visit | Attending: Radiation Oncology | Admitting: Radiation Oncology

## 2021-11-22 DIAGNOSIS — C7802 Secondary malignant neoplasm of left lung: Secondary | ICD-10-CM | POA: Diagnosis not present

## 2021-11-22 DIAGNOSIS — G893 Neoplasm related pain (acute) (chronic): Secondary | ICD-10-CM | POA: Diagnosis not present

## 2021-11-22 DIAGNOSIS — K5903 Drug induced constipation: Secondary | ICD-10-CM | POA: Diagnosis not present

## 2021-11-22 DIAGNOSIS — Z515 Encounter for palliative care: Secondary | ICD-10-CM | POA: Diagnosis not present

## 2021-11-22 DIAGNOSIS — C649 Malignant neoplasm of unspecified kidney, except renal pelvis: Secondary | ICD-10-CM | POA: Diagnosis not present

## 2021-11-22 DIAGNOSIS — C7800 Secondary malignant neoplasm of unspecified lung: Secondary | ICD-10-CM | POA: Diagnosis not present

## 2021-11-22 LAB — RAD ONC ARIA SESSION SUMMARY
Course Elapsed Days: 6
Plan Fractions Treated to Date: 5
Plan Prescribed Dose Per Fraction: 4 Gy
Plan Total Fractions Prescribed: 5
Plan Total Prescribed Dose: 20 Gy
Reference Point Dosage Given to Date: 20 Gy
Reference Point Session Dosage Given: 4 Gy
Session Number: 5

## 2021-11-22 MED ORDER — POLYETHYLENE GLYCOL 3350 17 G PO PACK: 17.0000 g | PACK | Freq: Every day | ORAL | 0 refills | Status: DC

## 2021-11-22 MED ORDER — MORPHINE SULFATE ER 15 MG PO TBCR: 15.0000 mg | EXTENDED_RELEASE_TABLET | Freq: Two times a day (BID) | ORAL | 0 refills | Status: AC

## 2021-11-22 MED ORDER — DEXTROMETHORPHAN POLISTIREX ER 30 MG/5ML PO SUER: 60.0000 mg | Freq: Two times a day (BID) | ORAL | 0 refills | Status: DC | PRN

## 2021-11-22 MED ORDER — OXYCODONE HCL 5 MG PO TABS: 5.0000 mg | ORAL_TABLET | ORAL | 0 refills | Status: AC | PRN

## 2021-11-22 NOTE — Progress Notes (Signed)
Daily Progress Note   Patient Name: Dylan Good       Date: 11/22/2021 DOB: 1971/02/07  Age: 51 y.o. MRN#: [id number] Attending Physician: Barb Merino, MD Primary Care Physician: Pcp, No Admit Date: 11/15/2021  Reason for Consultation/Follow-up: Non pain symptom management and Pain control  Subjective: I saw and examined Dylan Good today.  He was lying in bed in no distress.  Reports pain currently controlled.  Continues to complain of coughing and continues to use as needed medications.  We reviewed he was started on long-acting morphine yesterday and he tells me he feels that his pain is controlled by this and he is not needing breakthrough medication -had 1 dose of oxycodone yesterday.  He tells me he had a bowel movement this morning.  Length of Stay: 6  Current Medications: Scheduled Meds:   amLODipine  5 mg Oral Daily   ipratropium-albuterol  3 mL Nebulization TID   metoprolol succinate  25 mg Oral Daily   morphine  15 mg Oral Q12H   polyethylene glycol  17 g Oral Daily    Continuous Infusions:   PRN Meds: acetaminophen **OR** [DISCONTINUED] acetaminophen, albuterol, dextromethorphan, feeding supplement, HYDROcodone bit-homatropine, HYDROmorphone (DILAUDID) injection, ondansetron **OR** ondansetron (ZOFRAN) IV, oxyCODONE, pantoprazole + cough Physical Exam Constitutional:      General: He is not in acute distress.    Appearance: He is ill-appearing.  Pulmonary:     Effort: Pulmonary effort is normal.  Skin:    General: Skin is warm and dry.  Neurological:     Mental Status: He is alert and oriented to person, place, and time.             Vital Signs: BP 108/63 (BP Location: Right Arm)   Pulse 90   Temp 98.1 F (36.7 C)   Resp 17   Ht 5\' 9"  (1.753 m)   Wt  67.7 kg   SpO2 94%   BMI 22.05 kg/m  SpO2: SpO2: 94 % O2 Device: O2 Device: Room Air O2 Flow Rate:    Intake/output summary:  Intake/Output Summary (Last 24 hours) at 11/22/2021 1329 Last data filed at 11/22/2021 1100 Gross per 24 hour  Intake 960 ml  Output --  Net 960 ml    LBM: Last BM Date : 11/22/21 Baseline Weight: Weight: 67.7 kg Most  recent weight: Weight: 67.7 kg       Palliative Assessment/Data: PPS 50%      Patient Active Problem List   Diagnosis Date Noted   Malnutrition of moderate degree 11/17/2021   Metastatic renal cell carcinoma to lung ([place]) 11/16/2021   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[place]) 11/16/2021   Goals of care, counseling/discussion    Dyspnea 11/15/2021   GERD (gastroesophageal reflux disease) 11/15/2021   Hyponatremia 10/05/2021   Essential hypertension 10/05/2021   Right nephrolithiasis 10/04/2021   Hypercalcemia of malignancy 09/12/2021   Sinus tachycardia 09/12/2021   Neutropenia ([place]) 08/31/2021   Recent COVID-19 virus infection, possible bacterial pneumonia 08/29/2021   Malignant neoplasm metastatic to lung ([place]) 08/13/2021   Intractable pain 07/01/2021   Hypertension 07/01/2021   Tobacco use 07/01/2021   Acute pancreatitis 07/01/2021   Pleural effusion on left 01/31/2021   Brain metastases 01/24/2021   Hypercalcemia 01/15/2021   Kidney cancer, primary, with metastasis from kidney to other site [facility]) 12/29/2020    Palliative Care Assessment & Plan   Patient Profile:    Assessment:  Dylan Good is a 51 yo male with PMH RCC with pulmonary mets, HTN, GERD who presented with worsening cough, SOB, and CP Dyspnea and cough Generalized pain  Recommendations/Plan: Pain, cancer related: Continue MS Contin 15mg  twice daily.  Continue oxycodone 5mg  as needed for breakthrough pain  Constipation, opioid related: Continue miralax and senna Recommend palliative follow up at the cancer center as  outpatient.     Code Status:    Code Status Orders  (From admission, onward)           Start     Ordered   11/15/21 1529  Full code  Continuous        11/15/21 1528           Code Status History     Date Active Date Inactive Code Status Order ID Comments User Context   10/04/2021 2233 10/07/2021 1854 Full Code [id number]  Etta Quill, DO ED   09/12/2021 1849 09/16/2021 1613 Full Code [id number]  [name], MD ED   08/28/2021 2359 09/02/2021 1625 Full Code [id number]  Kristopher Oppenheim, DO Inpatient   08/28/2021 2134 08/28/2021 2359 Full Code [id number]  Kristopher Oppenheim, DO ED   08/12/2021 1347 08/14/2021 2313 Full Code [id number]  Jonnie Finner, DO ED   07/01/2021 0734 07/06/2021 1539 Full Code [id number]  Reubin Milan, MD ED   06/19/2021 0938 06/21/2021 2313 Full Code [id number]  Jonnie Finner, DO ED       Prognosis:  Unable to determine  Discharge Planning: To Be Determined  Care plan was discussed with patient     Thank you for allowing the Palliative Medicine Team to assist in the care of this patient.  Total time: 40 minutes  [place], NP  Please contact Palliative Medicine Team phone at [phone] for questions and concerns.

## 2021-11-22 NOTE — Discharge Summary (Signed)
Physician Discharge Summary  Dylan Good ZOX:[id number] DOB: [birth year] DOA: 11/15/2021  PCP: [name], No  Admit date: 11/15/2021 Discharge date: 11/22/2021  Admitted From: Home Disposition:  home   Recommendations for Outpatient Follow-up:  Oncology office will schedule follow up.  Follow up with palliative care team.  Home Health: N/A Equipment/Devices: N/A  Discharge Condition: Stable CODE STATUS: Full code Diet recommendation: Regular diet.  Laxatives.  Discharge summary: Dylan Good is a 51 yo male with PMH RCC with pulmonary mets, HTN, GERD who presented with worsening cough, SOB, and CP.  He has had no improvement with nebs and steroids at home.  Due to ongoing symptoms, he presented for further evaluation. CTA chest was performed which showed extensive thoracic metastatic disease with progression from February 2023 along with mediastinal adenopathy which has invaded the right mainstem bronchus with obstruction.  No PE. Case was discussed with oncology who also consulted radiation oncology and underwent 5 doses of radiation therapy today.  Pain management is issue.  Metastatic renal cell carcinoma, metastasis to the lung.  Cancer pain.  Hypercalcemia of malignancy.  And chronic dyspnea.  CTA chest on admission with right mainstem bronchus obstruction.  Evaluated by radiation oncology.  Last dose of radiation day 5/5 today. Oncology recommended palliative treatment, ongoing pain medications and management. Started on MS Contin and short-acting oxycodone with optimal pain control, however has ongoing cough and difficulties but stable today. Patient will probably continue to require more pain medications and ultimate hospice level of care.  Will be seen by palliative care at home. Discussed with his oncology, prescribed short course of MS Contin and oxycodone along with hydrocodone cough syrup to go home.  Further prescriptions will be provided by oncology office. He has inhaler  therapy that was prescribed.  Other medical issues including GERD, hypertension that remained stable and continue home medications.   Discharge Diagnoses:  Principal Problem:   Metastatic renal cell carcinoma to lung [facility]) Active Problems:   Hypertension   Hypercalcemia of malignancy   Dyspnea   GERD (gastroesophageal reflux disease)   Goals of care, counseling/discussion   Malnutrition of moderate degree    Discharge Instructions  Discharge Instructions     Amb Referral to Palliative Care   Complete by: As directed    Call MD for:  difficulty breathing, headache or visual disturbances   Complete by: As directed    Call MD for:  extreme fatigue   Complete by: As directed    Call MD for:  severe uncontrolled pain   Complete by: As directed    Diet general   Complete by: As directed    Increase activity slowly   Complete by: As directed       Allergies as of 11/22/2021   No Known Allergies      Medication List     STOP taking these medications    magnesium oxide 400 (240 Mg) MG tablet Commonly known as: MAG-OX       TAKE these medications    acetaminophen 325 MG tablet Commonly known as: TYLENOL Take 2 tablets (650 mg total) by mouth every 6 (six) hours as needed for mild pain (or Fever >/= 101).   amLODipine 5 MG tablet Commonly known as: NORVASC TAKE 1 TABLET (5 MG TOTAL) BY MOUTH DAILY.   dextromethorphan 30 MG/5ML liquid Commonly known as: DELSYM Take 10 mLs (60 mg total) by mouth 2 (two) times daily as needed for cough (if hycodan not effective).   HYDROcodone bit-homatropine  5-1.5 MG/5ML syrup Commonly known as: HYCODAN Take 5 mLs by mouth every 6 (six) hours as needed for cough.   metoprolol succinate 25 MG 24 hr tablet Commonly known as: TOPROL-XL Take 25 mg by mouth daily.   morphine 15 MG 12 hr tablet Commonly known as: MS CONTIN Take 1 tablet (15 mg total) by mouth every 12 (twelve) hours for 7 days.   ondansetron 4 MG  tablet Commonly known as: ZOFRAN Take 1 tablet (4 mg total) by mouth every 6 (six) hours as needed for nausea.   oxyCODONE 5 MG immediate release tablet Commonly known as: Oxy IR/ROXICODONE Take 1 tablet (5 mg total) by mouth every 4 (four) hours as needed for up to 7 days for moderate pain.   pantoprazole 40 MG tablet Commonly known as: Protonix Take 1 tablet (40 mg total) by mouth daily. What changed:  when to take this reasons to take this   polyethylene glycol 17 g packet Commonly known as: MIRALAX / GLYCOLAX Take 17 g by mouth daily. What changed:  when to take this reasons to take this   Symbicort 80-4.5 MCG/ACT inhaler Generic drug: budesonide-formoterol Inhale 2 puffs into the lungs daily as needed (for shortness of breath).   Ventolin HFA 108 (90 Base) MCG/ACT inhaler Generic drug: albuterol Inhale 2 puffs into the lungs every 4 (four) hours as needed for shortness of breath or wheezing. What changed: Another medication with the same name was removed. Continue taking this medication, and follow the directions you see here.        No Known Allergies  Consultations: Oncology Radiation oncology   Procedures/Studies: DG Chest 2 View  Result Date: 11/05/2021 CLINICAL DATA:  Cough for 2 days, history of metastatic disease EXAM: CHEST - 2 VIEW COMPARISON:  09/14/2021, 08/28/2021 FINDINGS: Cardiac shadow is within normal limits. The lungs are well aerated bilaterally. Stable nodular density is noted left lower lobe similar to that seen on prior CT examination. Left-sided pleural based mass lesion is noted with erosion of the fifth rib laterally and anteriorly similar to that seen on prior CT examination consistent with metastatic disease. Minimal left-sided effusion is seen. No other bony abnormality is noted. IMPRESSION: Pleural base mass with erosive changes of the fifth rib consistent with the known history of metastatic disease stable in appearance from prior CT  examination and plain film. Small left pleural effusion is seen. Persistent left basilar nodular changes similar to that seen on prior CT examination consistent with metastatic disease. Electronically Signed   By: [place] Catalina M.D.   On: 11/05/2021 03:53   CT Angio Chest PE W and/or Wo Contrast  Result Date: 11/15/2021 CLINICAL DATA:  Cough and shortness of breath with left chest pain. Pulmonary embolism suspected. EXAM: CT ANGIOGRAPHY CHEST WITH CONTRAST TECHNIQUE: Multidetector CT imaging of the chest was performed using the standard protocol during bolus administration of intravenous contrast. Multiplanar CT image reconstructions and MIPs were obtained to evaluate the vascular anatomy. RADIATION DOSE REDUCTION: This exam was performed according to the departmental dose-optimization program which includes automated exposure control, adjustment of the mA and/or kV according to patient size and/or use of iterative reconstruction technique. CONTRAST:  167mL OMNIPAQUE IOHEXOL 350 MG/ML SOLN COMPARISON:  08/28/2021 FINDINGS: Cardiovascular: Normal heart size. No pericardial effusion. Satisfactory opacification of the pulmonary arteries without filling defect. No acute aortic finding. Mediastinum/Nodes: Mediastinal adenopathy, especially in the pre and right paratracheal region, progressed with lower tracheal and right mainstem bronchus invasion causing right mainstem bronchus  obstruction. Narrowing of the SVC which remains patent. Lungs/Pleura: Widespread pulmonary metastatic disease with generalized increase size of the numerous pulmonary nodules. Dominant nodule on the right is along the anterior aspect of the minor fissure and measures 2.2 cm, a few mm larger than before. Mass in the left lower lobe measuring 4.5 cm, definitely increased from before. A left pleural effusion remains small volume with nodularity along the pleura especially at the posterior costophrenic sulcus. Upper Abdomen: No acute finding  Musculoskeletal: Left fifth rib metastasis laterally with increased size. The mass measures up to 6.3 cm in thickness on coronal reformats, previously 4.4 cm. Increased size of L1 upper body lucency, presumed metastasis. Review of the MIP images confirms the above findings. IMPRESSION: 1. Extensive thoracic metastatic disease which has progressed from February 2023. Mediastinal adenopathy has invaded the right mainstem bronchus with obstruction. 2. Negative for pulmonary embolism. Electronically Signed   By: Jorje Guild M.D.   On: 11/15/2021 07:14   DG Chest Portable 1 View  Result Date: 11/15/2021 CLINICAL DATA:  Cough EXAM: PORTABLE CHEST 1 VIEW COMPARISON:  Ten days ago FINDINGS: Worsening aeration on the left where there is pulmonary nodularity and pleural fluid by recent CT. Mass of the left chest wall with lateral fifth rib destruction. Upper mediastinal adenopathy. Normal heart size. IMPRESSION: Thoracic metastatic disease with worsening aeration at the left base when compared to radiograph 10 days ago, possible increase of left pleural effusion Electronically Signed   By: Jorje Guild M.D.   On: 11/15/2021 06:03   (Echo, Carotid, EGD, Colonoscopy, ERCP)    Subjective: Patient seen and examined.  No overnight events.  Continues to have intermittent cough and muscle pain after coughing.  Symptoms are fairly controlled today.  He only used oral pain medications for last 24 hours and is satisfied with the results.  Comfortable to go home with family.   Discharge Exam: Vitals:   11/22/21 0844 11/22/21 1416  BP:    Pulse:    Resp:    Temp:    SpO2: 94% 95%   Vitals:   11/21/21 2057 11/22/21 0326 11/22/21 0844 11/22/21 1416  BP: 113/72 108/63    Pulse: (!) 102 90    Resp: 16 17    Temp: 98.7 F (37.1 C) 98.1 F (36.7 C)    TempSrc:      SpO2: 95% 94% 94% 95%  Weight:      Height:        General: Pt is alert, awake, not in acute distress Frail and debilitated.  Chronically  sick looking.  Not in any distress.  On room air. Cardiovascular: RRR, S1/S2 +, no rubs, no gallops Respiratory: Mostly conducted upper airway sounds.  Coarse crackles on inspiration and expiration at bases. Abdominal: Soft, NT, ND, bowel sounds + Extremities: no edema, no cyanosis    The results of significant diagnostics from this hospitalization (including imaging, microbiology, ancillary and laboratory) are listed below for reference.     Microbiology: Recent Results (from the past 240 hour(s))  Resp Panel by RT-PCR (Flu A&B, Covid) Nasopharyngeal Swab     Status: None   Collection Time: 11/15/21  7:27 AM   Specimen: Nasopharyngeal Swab; Nasopharyngeal(NP) swabs in vial transport medium  Result Value Ref Range Status   SARS Coronavirus 2 by RT PCR NEGATIVE NEGATIVE Final    Comment: (NOTE) SARS-CoV-2 target nucleic acids are NOT DETECTED.  The SARS-CoV-2 RNA is generally detectable in upper respiratory specimens during the acute phase of  infection. The lowest concentration of SARS-CoV-2 viral copies this assay can detect is 138 copies/mL. A negative result does not preclude SARS-Cov-2 infection and should not be used as the sole basis for treatment or other patient management decisions. A negative result may occur with  improper specimen collection/handling, submission of specimen other than nasopharyngeal swab, presence of viral mutation(s) within the areas targeted by this assay, and inadequate number of viral copies(<138 copies/mL). A negative result must be combined with clinical observations, patient history, and epidemiological information. The expected result is Negative.  Fact Sheet for Patients:  EntrepreneurPulse.com.au  Fact Sheet for Healthcare Providers:  IncredibleEmployment.be  This test is no t yet approved or cleared by the [place] FDA and  has been authorized for detection and/or diagnosis of SARS-CoV-2 by FDA  under an Emergency Use Authorization (EUA). This EUA will remain  in effect (meaning this test can be used) for the duration of the COVID-19 declaration under Section 564(b)(1) of the Act, 21 U.S.C.section 360bbb-3(b)(1), unless the authorization is terminated  or revoked sooner.       Influenza A by PCR NEGATIVE NEGATIVE Final   Influenza B by PCR NEGATIVE NEGATIVE Final    Comment: (NOTE) The Xpert Xpress SARS-CoV-2/FLU/RSV plus assay is intended as an aid in the diagnosis of influenza from Nasopharyngeal swab specimens and should not be used as a sole basis for treatment. Nasal washings and aspirates are unacceptable for Xpert Xpress SARS-CoV-2/FLU/RSV testing.  Fact Sheet for Patients: EntrepreneurPulse.com.au  Fact Sheet for Healthcare Providers: IncredibleEmployment.be  This test is not yet approved or cleared by the [place] FDA and has been authorized for detection and/or diagnosis of SARS-CoV-2 by FDA under an Emergency Use Authorization (EUA). This EUA will remain in effect (meaning this test can be used) for the duration of the COVID-19 declaration under Section 564(b)(1) of the Act, 21 U.S.C. section 360bbb-3(b)(1), unless the authorization is terminated or revoked.  Performed at [facility], [place] [address], [place], [place] [address]      Labs: BNP (last 3 results) No results for input(s): BNP in the last 8760 hours. Basic Metabolic Panel: Recent Labs  Lab 11/17/21 0544 11/18/21 0535 11/19/21 0455 11/20/21 0533 11/21/21 0531  NA 135 140 133* 134* 134*  K 4.1 3.7 3.5 3.8 4.3  CL 106 108 103 103 104  CO2 23 24 24 24 23   GLUCOSE 152* 137* 118* 103* 93  BUN 21* 16 16 15 15   CREATININE 0.64 0.63 0.56* 0.53* 0.69  CALCIUM 11.8* 11.7* 10.8* 10.9* 10.8*  MG 2.2 2.0 1.9 2.0 2.0   Liver Function Tests: Recent Labs  Lab 11/16/21 0513  AST 13*  ALT 12  ALKPHOS 55  BILITOT 0.5  PROT 7.3   ALBUMIN 3.4*   No results for input(s): LIPASE, AMYLASE in the last 168 hours. No results for input(s): AMMONIA in the last 168 hours. CBC: Recent Labs  Lab 11/17/21 0544 11/18/21 0535 11/19/21 0455 11/20/21 0533 11/21/21 0531  WBC 13.1* 13.5* 12.4* 12.6* 12.0*  NEUTROABS 11.4* 10.4* 9.7* 9.9* 9.4*  HGB 11.2* 10.7* 11.1* 11.3* 11.5*  HCT 34.9* 34.8* 36.0* 35.0* 36.1*  MCV 81.0 81.7 81.6 80.6 80.8  PLT 346 311 307 289 315   Cardiac Enzymes: No results for input(s): CKTOTAL, CKMB, CKMBINDEX, TROPONINI in the last 168 hours. BNP: Invalid input(s): POCBNP CBG: No results for input(s): GLUCAP in the last 168 hours. D-Dimer No results for input(s): DDIMER in the last 72 hours. Hgb A1c  No results for input(s): HGBA1C in the last 72 hours. Lipid Profile No results for input(s): CHOL, HDL, LDLCALC, TRIG, CHOLHDL, LDLDIRECT in the last 72 hours. Thyroid function studies No results for input(s): TSH, T4TOTAL, T3FREE, THYROIDAB in the last 72 hours.  Invalid input(s): FREET3 Anemia work up No results for input(s): VITAMINB12, FOLATE, FERRITIN, TIBC, IRON, RETICCTPCT in the last 72 hours. Urinalysis    Component Value Date/Time   COLORURINE YELLOW 10/04/2021 1953   APPEARANCEUR CLEAR 10/04/2021 1953   LABSPEC 1.019 10/04/2021 1953   PHURINE 5.0 10/04/2021 1953   GLUCOSEU NEGATIVE 10/04/2021 1953   HGBUR MODERATE (A) 10/04/2021 1953   BILIRUBINUR NEGATIVE 10/04/2021 1953   KETONESUR NEGATIVE 10/04/2021 1953   PROTEINUR NEGATIVE 10/04/2021 1953   NITRITE NEGATIVE 10/04/2021 1953   LEUKOCYTESUR SMALL (A) 10/04/2021 1953   Sepsis Labs Invalid input(s): PROCALCITONIN,  WBC,  LACTICIDVEN Microbiology Recent Results (from the past 240 hour(s))  Resp Panel by RT-PCR (Flu A&B, Covid) Nasopharyngeal Swab     Status: None   Collection Time: 11/15/21  7:27 AM   Specimen: Nasopharyngeal Swab; Nasopharyngeal(NP) swabs in vial transport medium  Result Value Ref Range Status   SARS  Coronavirus 2 by RT PCR NEGATIVE NEGATIVE Final    Comment: (NOTE) SARS-CoV-2 target nucleic acids are NOT DETECTED.  The SARS-CoV-2 RNA is generally detectable in upper respiratory specimens during the acute phase of infection. The lowest concentration of SARS-CoV-2 viral copies this assay can detect is 138 copies/mL. A negative result does not preclude SARS-Cov-2 infection and should not be used as the sole basis for treatment or other patient management decisions. A negative result may occur with  improper specimen collection/handling, submission of specimen other than nasopharyngeal swab, presence of viral mutation(s) within the areas targeted by this assay, and inadequate number of viral copies(<138 copies/mL). A negative result must be combined with clinical observations, patient history, and epidemiological information. The expected result is Negative.  Fact Sheet for Patients:  EntrepreneurPulse.com.au  Fact Sheet for Healthcare Providers:  IncredibleEmployment.be  This test is no t yet approved or cleared by the [place] FDA and  has been authorized for detection and/or diagnosis of SARS-CoV-2 by FDA under an Emergency Use Authorization (EUA). This EUA will remain  in effect (meaning this test can be used) for the duration of the COVID-19 declaration under Section 564(b)(1) of the Act, 21 U.S.C.section 360bbb-3(b)(1), unless the authorization is terminated  or revoked sooner.       Influenza A by PCR NEGATIVE NEGATIVE Final   Influenza B by PCR NEGATIVE NEGATIVE Final    Comment: (NOTE) The Xpert Xpress SARS-CoV-2/FLU/RSV plus assay is intended as an aid in the diagnosis of influenza from Nasopharyngeal swab specimens and should not be used as a sole basis for treatment. Nasal washings and aspirates are unacceptable for Xpert Xpress SARS-CoV-2/FLU/RSV testing.  Fact Sheet for  Patients: EntrepreneurPulse.com.au  Fact Sheet for Healthcare Providers: IncredibleEmployment.be  This test is not yet approved or cleared by the [place] FDA and has been authorized for detection and/or diagnosis of SARS-CoV-2 by FDA under an Emergency Use Authorization (EUA). This EUA will remain in effect (meaning this test can be used) for the duration of the COVID-19 declaration under Section 564(b)(1) of the Act, 21 U.S.C. section 360bbb-3(b)(1), unless the authorization is terminated or revoked.  Performed at [facility], [place] [address], [place], [place] [address]      Time coordinating discharge: 35 minutes  SIGNED:   Dante Gang  Adryen Cookson, MD  [facility] 11/22/2021, 2:21 PM

## 2021-11-22 NOTE — Plan of Care (Signed)
Patient ID: Dylan Good, male   DOB: 1971/03/03, 51 y.o.   MRN: [id number]  Problem: Education: Goal: Knowledge of disease or condition will improve Outcome: Adequate for Discharge Goal: Knowledge of the prescribed therapeutic regimen will improve Outcome: Adequate for Discharge Goal: Individualized Educational Video(s) Outcome: Adequate for Discharge   Problem: Activity: Goal: Ability to tolerate increased activity will improve Outcome: Adequate for Discharge Goal: Will verbalize the importance of balancing activity with adequate rest periods Outcome: Adequate for Discharge   Problem: Respiratory: Goal: Ability to maintain a clear airway will improve Outcome: Adequate for Discharge Goal: Levels of oxygenation will improve Outcome: Adequate for Discharge Goal: Ability to maintain adequate ventilation will improve Outcome: Adequate for Discharge   Problem: Education: Goal: Knowledge of General Education information will improve Description: Including pain rating scale, medication(s)/side effects and non-pharmacologic comfort measures Outcome: Adequate for Discharge   Problem: Health Behavior/Discharge Planning: Goal: Ability to manage health-related needs will improve Outcome: Adequate for Discharge   Problem: Clinical Measurements: Goal: Ability to maintain clinical measurements within normal limits will improve Outcome: Adequate for Discharge Goal: Will remain free from infection Outcome: Adequate for Discharge Goal: Diagnostic test results will improve Outcome: Adequate for Discharge Goal: Respiratory complications will improve Outcome: Adequate for Discharge Goal: Cardiovascular complication will be avoided Outcome: Adequate for Discharge   Problem: Activity: Goal: Risk for activity intolerance will decrease Outcome: Adequate for Discharge   Problem: Nutrition: Goal: Adequate nutrition will be maintained Outcome: Adequate for Discharge   Problem:  Coping: Goal: Level of anxiety will decrease Outcome: Adequate for Discharge   Problem: Elimination: Goal: Will not experience complications related to bowel motility Outcome: Adequate for Discharge Goal: Will not experience complications related to urinary retention Outcome: Adequate for Discharge   Problem: Pain Managment: Goal: General experience of comfort will improve Outcome: Adequate for Discharge   Problem: Safety: Goal: Ability to remain free from injury will improve Outcome: Adequate for Discharge   Problem: Skin Integrity: Goal: Risk for impaired skin integrity will decrease Outcome: Adequate for Discharge   Problem: Malnutrition  (NI-5.2) Goal: Food and/or nutrient delivery Description: Individualized approach for food/nutrient provision. Outcome: Adequate for Discharge    Haydee Salter, RN

## 2021-11-22 NOTE — Telephone Encounter (Signed)
Called patient to inform of MRI on 12-04-21- arrival time- 12:30 pm @ [facility], no restrictions to test, lvm for a return call

## 2021-11-22 NOTE — TOC Progression Note (Signed)
Transition of Care [facility]) - Progression Note    Patient Details  Name: Dylan Good MRN: [id number] Date of Birth: [birth year]  Transition of Care [facility]) CM/SW [place], [place] Phone Number: 11/22/2021, 1:36 PM  Clinical Narrative:     CSW received notification that Biomedical scientist) with [facility] called requesting update regarding pt's DC. Pt will DC after radiation tx today. Plan to follow up with palliative at cancer center as outpatient. CSW left a message yesterday requesting call back from [place] called again today and left message requesting call back.        Expected Discharge Plan and Services           Expected Discharge Date: 11/22/21                                     Social Determinants of Health (SDOH) Interventions    Readmission Risk Interventions    10/05/2021   12:10 PM 09/14/2021   10:34 AM  Readmission Risk Prevention Plan  Transportation Screening Complete Complete  PCP or Specialist Appt within 3-5 Days  Complete  HRI or [place]  Complete  Social Work Consult for [place] Planning/Counseling  Complete  Palliative Care Screening  Complete  Medication Review Press photographer) Complete Complete  PCP or Specialist appointment within 3-5 days of discharge Complete   HRI or [place] Complete   SW Recovery Care/Counseling Consult Complete   Palliative Care Screening Complete   [place] Not Applicable

## 2021-11-24 ENCOUNTER — Emergency Department (HOSPITAL_COMMUNITY): Payer: Medicaid Other

## 2021-11-24 ENCOUNTER — Inpatient Hospital Stay (HOSPITAL_COMMUNITY)
Admission: EM | Admit: 2021-11-24 | Discharge: 2021-11-28 | DRG: 181 | Payer: Medicaid Other | Attending: Internal Medicine | Admitting: Internal Medicine

## 2021-11-24 ENCOUNTER — Other Ambulatory Visit: Payer: Self-pay

## 2021-11-24 ENCOUNTER — Encounter (HOSPITAL_COMMUNITY): Payer: Self-pay | Admitting: Emergency Medicine

## 2021-11-24 DIAGNOSIS — C7951 Secondary malignant neoplasm of bone: Secondary | ICD-10-CM | POA: Diagnosis present

## 2021-11-24 DIAGNOSIS — K853 Drug induced acute pancreatitis without necrosis or infection: Secondary | ICD-10-CM | POA: Diagnosis present

## 2021-11-24 DIAGNOSIS — E872 Acidosis, unspecified: Secondary | ICD-10-CM | POA: Diagnosis present

## 2021-11-24 DIAGNOSIS — R64 Cachexia: Secondary | ICD-10-CM | POA: Diagnosis present

## 2021-11-24 DIAGNOSIS — C7940 Secondary malignant neoplasm of unspecified part of nervous system: Secondary | ICD-10-CM | POA: Diagnosis present

## 2021-11-24 DIAGNOSIS — E871 Hypo-osmolality and hyponatremia: Secondary | ICD-10-CM | POA: Diagnosis present

## 2021-11-24 DIAGNOSIS — R0603 Acute respiratory distress: Secondary | ICD-10-CM | POA: Diagnosis present

## 2021-11-24 DIAGNOSIS — R509 Fever, unspecified: Secondary | ICD-10-CM | POA: Diagnosis present

## 2021-11-24 DIAGNOSIS — C649 Malignant neoplasm of unspecified kidney, except renal pelvis: Secondary | ICD-10-CM | POA: Diagnosis not present

## 2021-11-24 DIAGNOSIS — Z681 Body mass index (BMI) 19 or less, adult: Secondary | ICD-10-CM | POA: Diagnosis not present

## 2021-11-24 DIAGNOSIS — C782 Secondary malignant neoplasm of pleura: Secondary | ICD-10-CM | POA: Diagnosis present

## 2021-11-24 DIAGNOSIS — C7800 Secondary malignant neoplasm of unspecified lung: Secondary | ICD-10-CM | POA: Diagnosis present

## 2021-11-24 DIAGNOSIS — Z85528 Personal history of other malignant neoplasm of kidney: Secondary | ICD-10-CM

## 2021-11-24 DIAGNOSIS — Z79899 Other long term (current) drug therapy: Secondary | ICD-10-CM

## 2021-11-24 DIAGNOSIS — R590 Localized enlarged lymph nodes: Secondary | ICD-10-CM | POA: Diagnosis present

## 2021-11-24 DIAGNOSIS — Z832 Family history of diseases of the blood and blood-forming organs and certain disorders involving the immune mechanism: Secondary | ICD-10-CM | POA: Diagnosis not present

## 2021-11-24 DIAGNOSIS — C3401 Malignant neoplasm of right main bronchus: Secondary | ICD-10-CM | POA: Diagnosis present

## 2021-11-24 DIAGNOSIS — T50Z15A Adverse effect of immunoglobulin, initial encounter: Secondary | ICD-10-CM | POA: Diagnosis present

## 2021-11-24 DIAGNOSIS — F17210 Nicotine dependence, cigarettes, uncomplicated: Secondary | ICD-10-CM | POA: Diagnosis present

## 2021-11-24 DIAGNOSIS — J700 Acute pulmonary manifestations due to radiation: Secondary | ICD-10-CM | POA: Diagnosis present

## 2021-11-24 DIAGNOSIS — C7971 Secondary malignant neoplasm of right adrenal gland: Secondary | ICD-10-CM | POA: Diagnosis present

## 2021-11-24 DIAGNOSIS — G893 Neoplasm related pain (acute) (chronic): Secondary | ICD-10-CM | POA: Diagnosis present

## 2021-11-24 DIAGNOSIS — C641 Malignant neoplasm of right kidney, except renal pelvis: Secondary | ICD-10-CM | POA: Diagnosis present

## 2021-11-24 DIAGNOSIS — K861 Other chronic pancreatitis: Secondary | ICD-10-CM | POA: Diagnosis present

## 2021-11-24 DIAGNOSIS — I10 Essential (primary) hypertension: Secondary | ICD-10-CM | POA: Diagnosis present

## 2021-11-24 DIAGNOSIS — R0600 Dyspnea, unspecified: Principal | ICD-10-CM

## 2021-11-24 DIAGNOSIS — J9601 Acute respiratory failure with hypoxia: Secondary | ICD-10-CM | POA: Diagnosis not present

## 2021-11-24 DIAGNOSIS — R17 Unspecified jaundice: Secondary | ICD-10-CM

## 2021-11-24 DIAGNOSIS — C7972 Secondary malignant neoplasm of left adrenal gland: Secondary | ICD-10-CM | POA: Diagnosis present

## 2021-11-24 DIAGNOSIS — Z20822 Contact with and (suspected) exposure to covid-19: Secondary | ICD-10-CM | POA: Diagnosis present

## 2021-11-24 DIAGNOSIS — Z809 Family history of malignant neoplasm, unspecified: Secondary | ICD-10-CM | POA: Diagnosis not present

## 2021-11-24 DIAGNOSIS — Z7951 Long term (current) use of inhaled steroids: Secondary | ICD-10-CM

## 2021-11-24 LAB — BLOOD GAS, VENOUS
Acid-base deficit: 3.4 mmol/L — ABNORMAL HIGH (ref 0.0–2.0)
Bicarbonate: 21.4 mmol/L (ref 20.0–28.0)
O2 Saturation: 56.6 %
Patient temperature: 37
pCO2, Ven: 37 mmHg — ABNORMAL LOW (ref 44–60)
pH, Ven: 7.37 (ref 7.25–7.43)
pO2, Ven: <31 mmHg — CL (ref 32–45)

## 2021-11-24 LAB — CBC WITH DIFFERENTIAL/PLATELET
Abs Immature Granulocytes: 0.04 10*3/uL (ref 0.00–0.07)
Basophils Absolute: 0 10*3/uL (ref 0.0–0.1)
Basophils Relative: 1 %
Eosinophils Absolute: 0.1 10*3/uL (ref 0.0–0.5)
Eosinophils Relative: 1 %
HCT: 42.5 % (ref 39.0–52.0)
Hemoglobin: 13.7 g/dL (ref 13.0–17.0)
Immature Granulocytes: 1 %
Lymphocytes Relative: 10 %
Lymphs Abs: 0.6 10*3/uL — ABNORMAL LOW (ref 0.7–4.0)
MCH: 26.4 pg (ref 26.0–34.0)
MCHC: 32.2 g/dL (ref 30.0–36.0)
MCV: 81.9 fL (ref 80.0–100.0)
Monocytes Absolute: 0.7 10*3/uL (ref 0.1–1.0)
Monocytes Relative: 12 %
Neutro Abs: 4.2 10*3/uL (ref 1.7–7.7)
Neutrophils Relative %: 75 %
Platelets: 353 10*3/uL (ref 150–400)
RBC: 5.19 MIL/uL (ref 4.22–5.81)
RDW: 15.9 % — ABNORMAL HIGH (ref 11.5–15.5)
WBC: 5.6 10*3/uL (ref 4.0–10.5)
nRBC: 0 % (ref 0.0–0.2)

## 2021-11-24 LAB — COMPREHENSIVE METABOLIC PANEL
ALT: 17 U/L (ref 0–44)
AST: 18 U/L (ref 15–41)
Albumin: 3.7 g/dL (ref 3.5–5.0)
Alkaline Phosphatase: 76 U/L (ref 38–126)
Anion gap: 13 (ref 5–15)
BUN: 16 mg/dL (ref 6–20)
CO2: 19 mmol/L — ABNORMAL LOW (ref 22–32)
Calcium: 12.2 mg/dL — ABNORMAL HIGH (ref 8.9–10.3)
Chloride: 99 mmol/L (ref 98–111)
Creatinine, Ser: 0.74 mg/dL (ref 0.61–1.24)
GFR, Estimated: >60 mL/min (ref >60)
Glucose, Bld: 100 mg/dL — ABNORMAL HIGH (ref 70–99)
Potassium: 4.8 mmol/L (ref 3.5–5.1)
Sodium: 131 mmol/L — ABNORMAL LOW (ref 135–145)
Total Bilirubin: 3.3 mg/dL — ABNORMAL HIGH (ref 0.3–1.2)
Total Protein: 8.1 g/dL (ref 6.5–8.1)

## 2021-11-24 LAB — TROPONIN I (HIGH SENSITIVITY)
Troponin I (High Sensitivity): 3 ng/L (ref <18)
Troponin I (High Sensitivity): 3 ng/L (ref <18)

## 2021-11-24 LAB — MRSA NEXT GEN BY PCR, NASAL: MRSA by PCR Next Gen: NOT DETECTED

## 2021-11-24 LAB — BRAIN NATRIURETIC PEPTIDE: B Natriuretic Peptide: 16 pg/mL (ref 0.0–100.0)

## 2021-11-24 IMAGING — CT CT ANGIO CHEST
2 of 7 series · 17 of 46 positions shown · IV contrast (ISOVUE 370)
Comparison: [DATE] chest CT and prior studies

CLINICAL DATA: 50-year-old male with acute shortness of breath.
Patient with metastatic renal cell carcinoma.

EXAM:
CT ANGIOGRAPHY CHEST WITH CONTRAST
TECHNIQUE: Multidetector CT imaging of the chest was performed using the
standard protocol during bolus administration of intravenous
contrast. Multiplanar CT image reconstructions and MIPs were
obtained to evaluate the vascular anatomy.

[Series 5: thins · axial · 0.71mm/px · z∈[+1074,+1380]mm · 15 of 350 slices shown]
[im 22/350  lung]
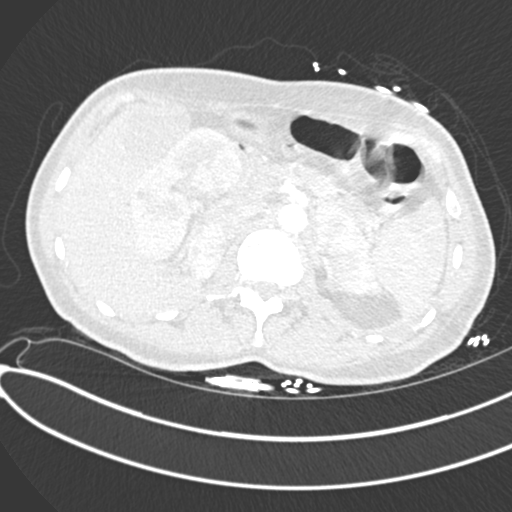
[im 44/350  soft-tissue]
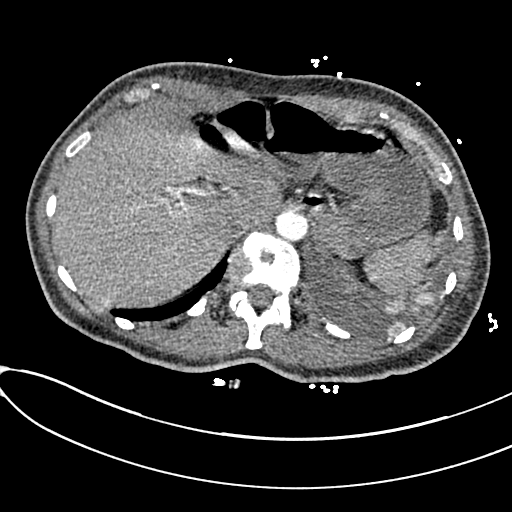
[im 66/350  lung]
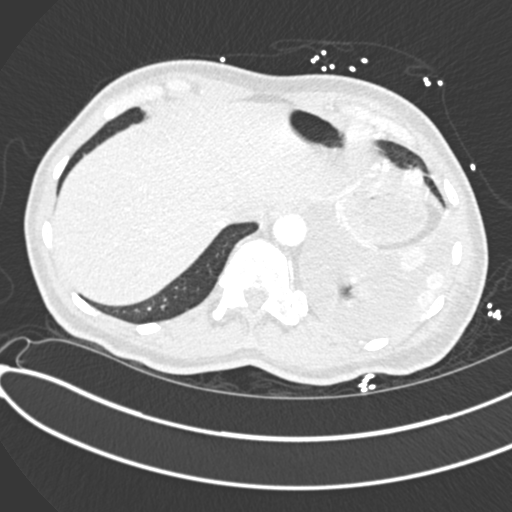
[im 88/350  soft-tissue]
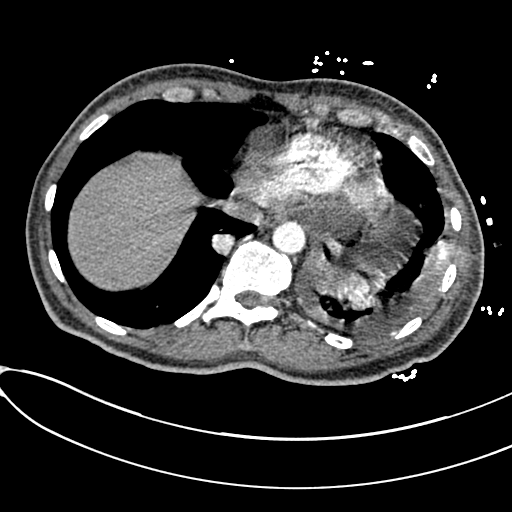
[im 110/350  lung]
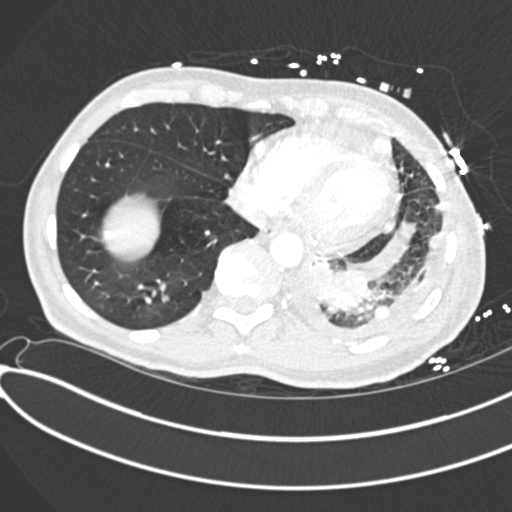
[im 131/350  soft-tissue]
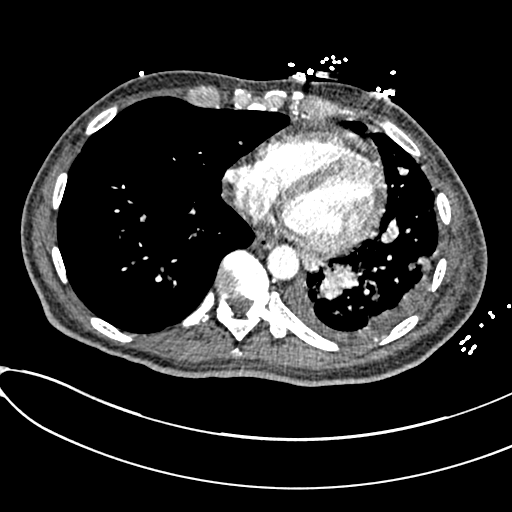
[im 153/350  lung]
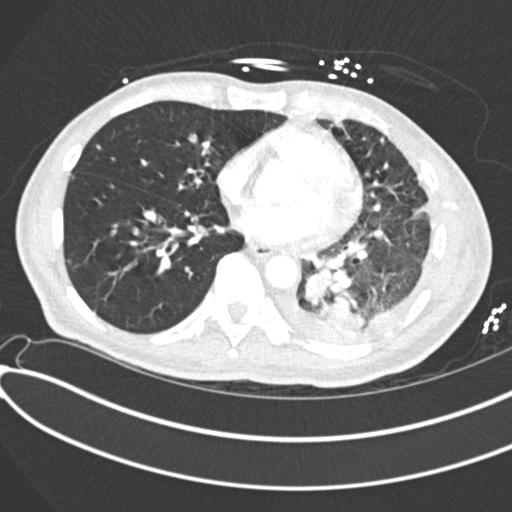
[im 175/350  soft-tissue]
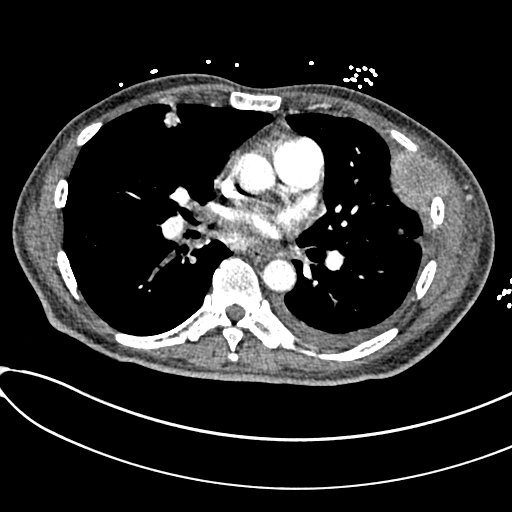
[im 197/350  lung]
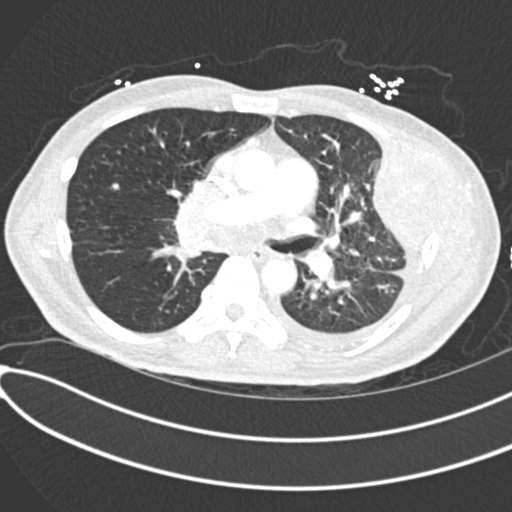
[im 219/350  soft-tissue]
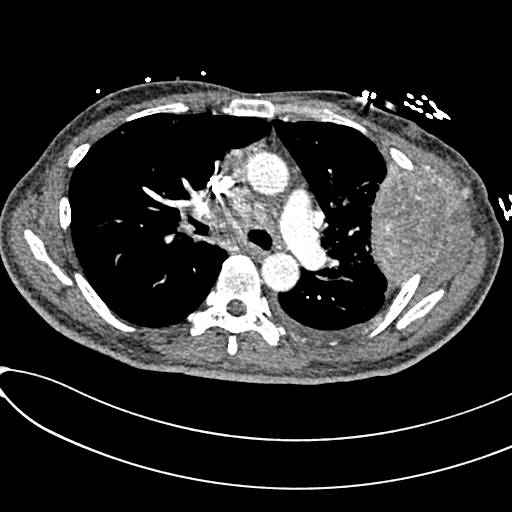
[im 240/350  lung]
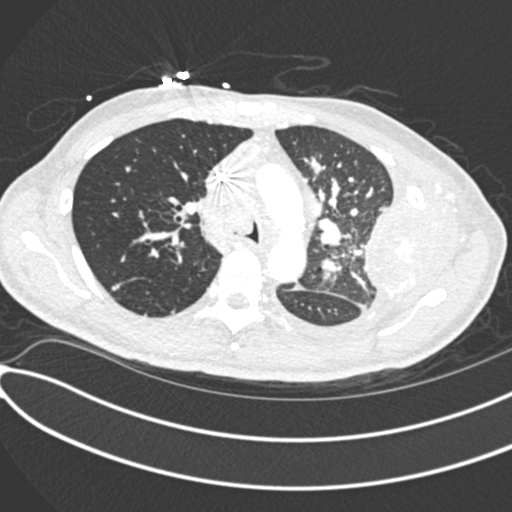
[im 262/350  soft-tissue]
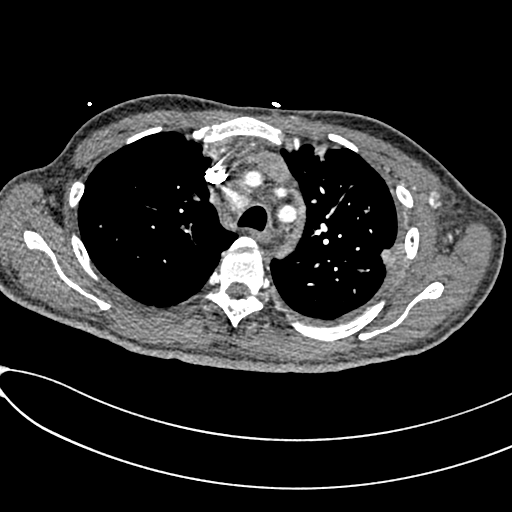
[im 284/350  lung]
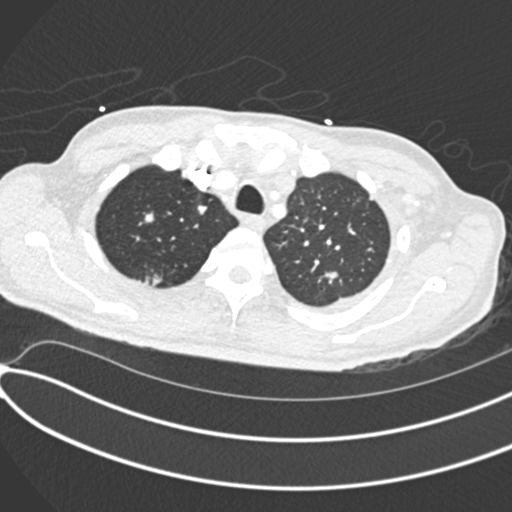
[im 306/350  soft-tissue]
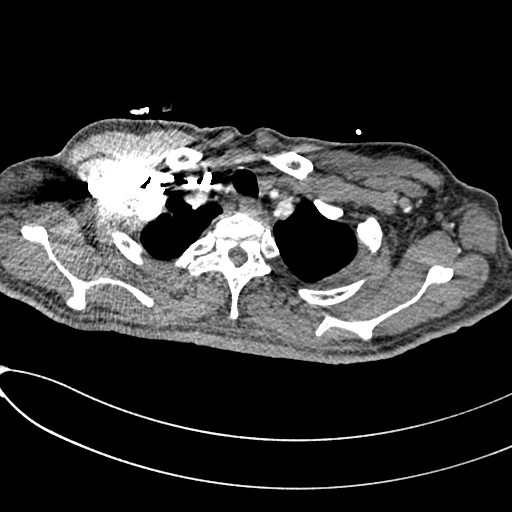
[im 328/350  lung]
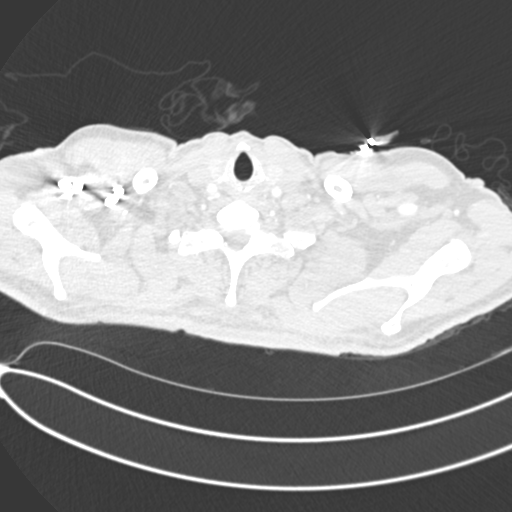

[Series 6: coronal mpr · coronal · 0.72mm/px · 2 of 77 slices shown]
[im 26/77  soft-tissue]
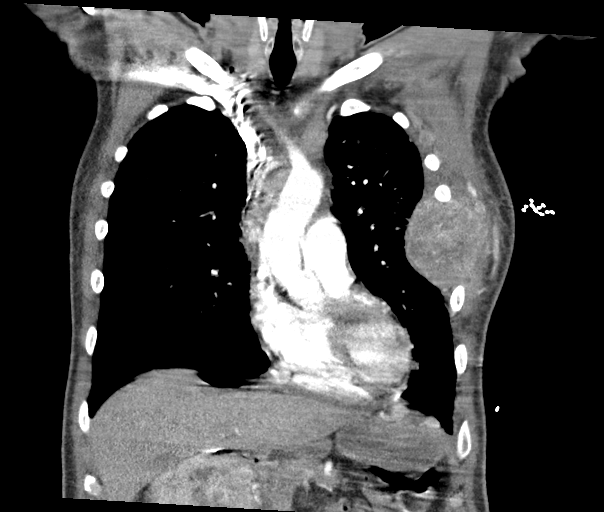
[im 51/77  soft-tissue]
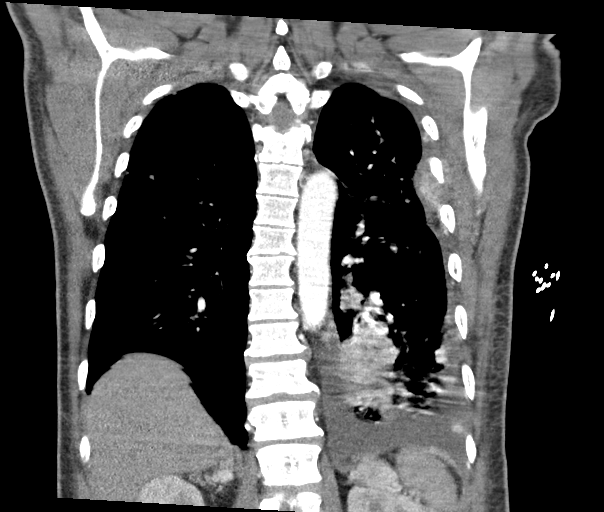

[17 of 46 positions shown; findings below may reference images not displayed]

RADIATION DOSE REDUCTION: This exam was performed according to the
departmental dose-optimization program which includes automated
exposure control, adjustment of the mA and/or kV according to
patient size and/or use of iterative reconstruction technique.

CONTRAST:  100mL OMNIPAQUE IOHEXOL 350 MG/ML SOLN
FINDINGS: Cardiovascular: This is a technically adequate study by respiratory
motion artifact slightly limits sensitivity in the LOWER lungs. No
pulmonary emboli are identified.

Heart size is normal. A small pericardial effusion is noted. There
is no evidence of thoracic aortic aneurysm.

Mediastinum/Nodes: Extensive mediastinal and RIGHT hilar adenopathy
is unchanged. LOWER tracheal and RIGHT mainstem bronchus invasion
again noted with mucous thin the RIGHT mainstem and some distal
RIGHT LOWER lobe bronchi. Visualized thyroid and esophagus are
unremarkable.

Lungs/Pleura: Innumerable pulmonary nodules are unchanged compatible
with metastatic disease. Larger LEFT LOWER lobe mass/metastasis is
unchanged measuring 4.5 cm.

Multiple LEFT pleural metastases are again noted with slightly
increasing small LEFT pleural effusion. No new pulmonary
abnormalities are noted. There is no evidence of pneumothorax or
RIGHT pleural effusion.

Upper Abdomen: Partial visualization of known RIGHT renal cell
carcinoma and bilateral adrenal metastases again identified. No
acute abnormalities are identified.

Musculoskeletal: LEFT 5th rib metastasis with large soft tissue
component measuring up to 8.5 x 7.5 x 6.3 cm again noted. L1
metastasis again noted. No new or acute bony abnormalities are
identified.

Review of the MIP images confirms the above findings.
IMPRESSION: 1. No evidence of pulmonary emboli.
2. Slightly increasing small LEFT pleural effusion without other
significant change since [DATE].
[DATE]. Unchanged widespread bilateral pulmonary and LEFT pleural
metastases, mediastinal and RIGHT hilar adenopathy, LEFT 5th rib and
L1 metastases.
4. RIGHT renal cell carcinoma and bilateral adrenal metastases again
identified.

## 2021-11-24 IMAGING — DX DG CHEST 1V PORT
2 series · 2 of 2 positions shown · non-contrast
Comparison: Chest radiograph and chest CT dated [DATE].

CLINICAL DATA: Shortness of breath, history of lung cancer.

EXAM:
PORTABLE CHEST 1 VIEW

[chest ap (1 of 2)]
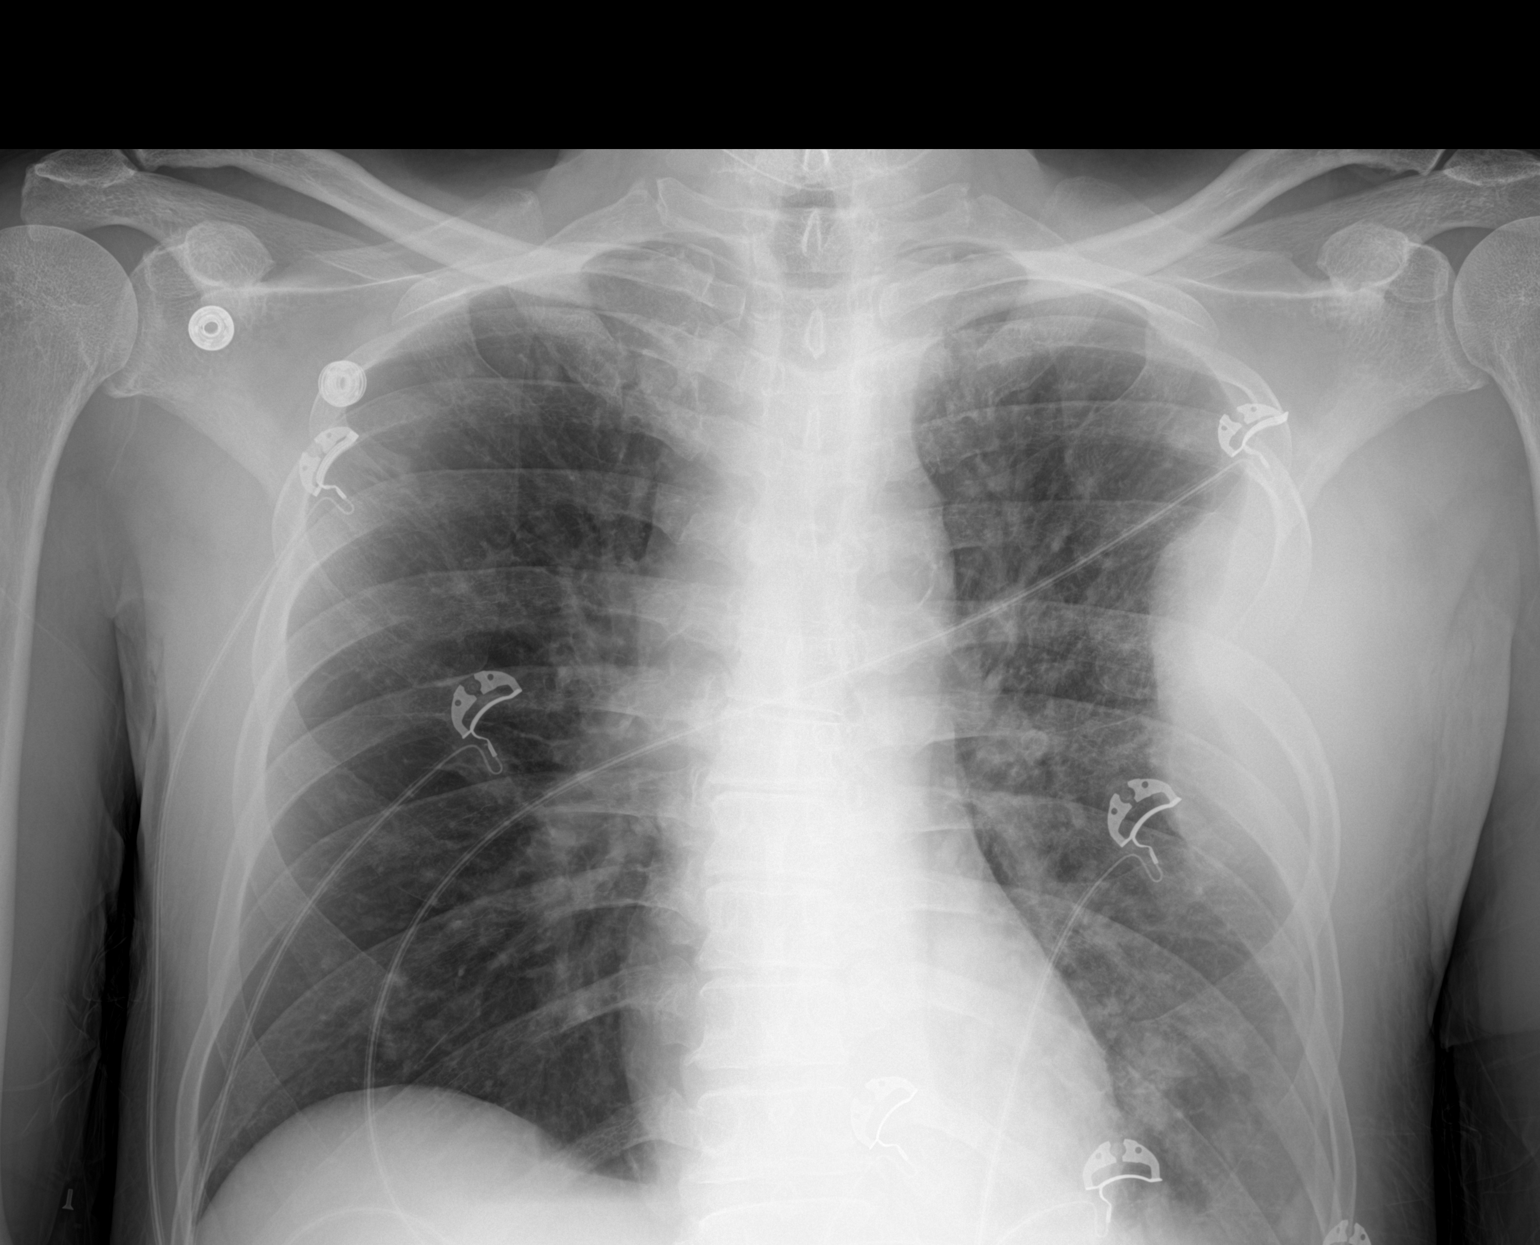

[chest ap (2 of 2)]
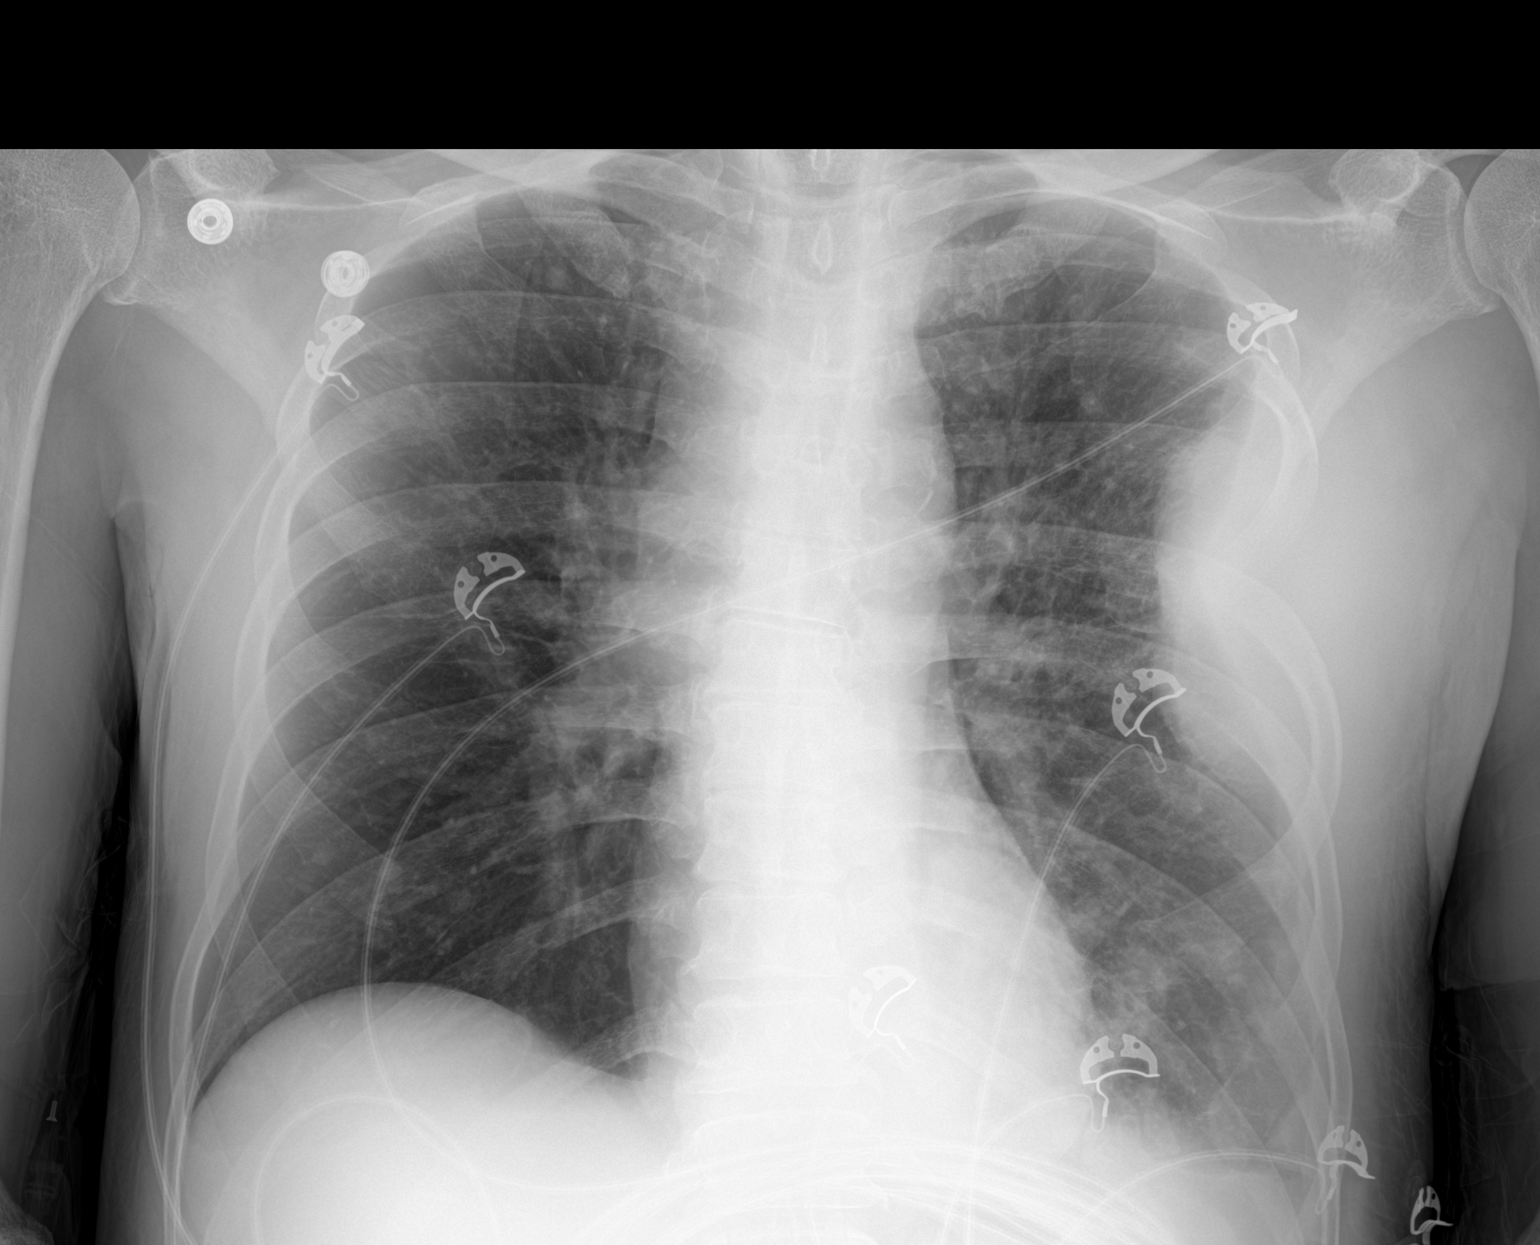

[2 of 2 positions shown; findings below may reference images not displayed]

FINDINGS: The heart size and mediastinal contours are within normal limits.
Vascular calcifications are seen in the aortic arch. A mass in the
lateral left chest wall with associated destruction of the left
fifth rib appears unchanged. Nodularity and airspace opacities in
the left lung appear similar to [DATE]. A small left pleural
effusion is unchanged. Pulmonary nodules in the right lung are
better characterized on prior CT. There is no right pleural
effusion. There is no pneumothorax.
IMPRESSION: Metastatic disease involving the left chest wall and both lungs
appears similar to [DATE].

Aortic Atherosclerosis ([86]-[86]).

## 2021-11-24 MED ORDER — ACETAMINOPHEN 325 MG PO TABS: 650.0000 mg | ORAL_TABLET | Freq: Four times a day (QID) | ORAL | Status: DC | PRN

## 2021-11-24 MED ORDER — ORAL CARE MOUTH RINSE
15.0000 mL | Freq: Two times a day (BID) | OROMUCOSAL | Status: DC
  Administered 2021-11-25: 15 mL via OROMUCOSAL

## 2021-11-24 MED ORDER — LACTATED RINGERS IV BOLUS
1000.0000 mL | Freq: Once | INTRAVENOUS | Status: AC
  Administered 2021-11-24: 1000 mL via INTRAVENOUS

## 2021-11-24 MED ORDER — IPRATROPIUM-ALBUTEROL 0.5-2.5 (3) MG/3ML IN SOLN
6.0000 mL | Freq: Once | RESPIRATORY_TRACT | Status: AC
  Administered 2021-11-24: 6 mL via RESPIRATORY_TRACT
  Filled 2021-11-24: qty 6

## 2021-11-24 MED ORDER — IPRATROPIUM-ALBUTEROL 0.5-2.5 (3) MG/3ML IN SOLN: 3.0000 mL | Freq: Four times a day (QID) | RESPIRATORY_TRACT | Status: DC | PRN

## 2021-11-24 MED ORDER — OXYCODONE HCL 5 MG PO TABS
10.0000 mg | ORAL_TABLET | Freq: Four times a day (QID) | ORAL | Status: DC | PRN
  Administered 2021-11-25 – 2021-11-28 (×4): 10 mg via ORAL
  Filled 2021-11-24 (×4): qty 2

## 2021-11-24 MED ORDER — HYDROMORPHONE HCL 1 MG/ML IJ SOLN
1.0000 mg | INTRAMUSCULAR | Status: DC | PRN
  Administered 2021-11-24 – 2021-11-27 (×6): 1 mg via INTRAVENOUS
  Filled 2021-11-24 (×6): qty 1

## 2021-11-24 MED ORDER — SODIUM CHLORIDE (PF) 0.9 % IJ SOLN
INTRAMUSCULAR | Status: AC
  Filled 2021-11-24: qty 50

## 2021-11-24 MED ORDER — ENOXAPARIN SODIUM 40 MG/0.4ML IJ SOSY
40.0000 mg | PREFILLED_SYRINGE | INTRAMUSCULAR | Status: DC
  Administered 2021-11-24 – 2021-11-27 (×4): 40 mg via SUBCUTANEOUS
  Filled 2021-11-24 (×4): qty 0.4

## 2021-11-24 MED ORDER — CHLORHEXIDINE GLUCONATE 0.12 % MT SOLN
15.0000 mL | Freq: Two times a day (BID) | OROMUCOSAL | Status: DC
  Administered 2021-11-24 – 2021-11-27 (×7): 15 mL via OROMUCOSAL
  Filled 2021-11-24 (×7): qty 15

## 2021-11-24 MED ORDER — IOHEXOL 350 MG/ML SOLN
100.0000 mL | Freq: Once | INTRAVENOUS | Status: AC | PRN
  Administered 2021-11-24: 100 mL via INTRAVENOUS

## 2021-11-24 MED ORDER — METHYLPREDNISOLONE SODIUM SUCC 125 MG IJ SOLR
125.0000 mg | Freq: Once | INTRAMUSCULAR | Status: AC
  Administered 2021-11-24: 125 mg via INTRAVENOUS
  Filled 2021-11-24: qty 2

## 2021-11-24 MED ORDER — MAGNESIUM SULFATE 2 GM/50ML IV SOLN
2.0000 g | Freq: Once | INTRAVENOUS | Status: AC
  Administered 2021-11-24: 2 g via INTRAVENOUS
  Filled 2021-11-24: qty 50

## 2021-11-24 MED ORDER — ONDANSETRON HCL 4 MG/2ML IJ SOLN
4.0000 mg | Freq: Four times a day (QID) | INTRAMUSCULAR | Status: DC | PRN
  Administered 2021-11-24 – 2021-11-27 (×2): 4 mg via INTRAVENOUS
  Filled 2021-11-24 (×2): qty 2

## 2021-11-24 MED ORDER — CHLORHEXIDINE GLUCONATE CLOTH 2 % EX PADS
6.0000 | MEDICATED_PAD | Freq: Every day | CUTANEOUS | Status: DC
  Administered 2021-11-24 – 2021-11-25 (×2): 6 via TOPICAL

## 2021-11-24 MED ORDER — SODIUM CHLORIDE 0.9 % IV SOLN
3.0000 g | Freq: Four times a day (QID) | INTRAVENOUS | Status: DC
  Administered 2021-11-24 – 2021-11-25 (×2): 3 g via INTRAVENOUS
  Filled 2021-11-24 (×2): qty 8

## 2021-11-24 MED ORDER — LACTATED RINGERS IV SOLN
INTRAVENOUS | Status: AC
Start: 2021-11-24 — End: 2021-11-25

## 2021-11-24 NOTE — Assessment & Plan Note (Signed)
Remains elevated at 12.2 but stable -receives Zometa infusions outpatient  -will have oncology team consult tomorrow

## 2021-11-24 NOTE — Assessment & Plan Note (Signed)
Elevated total bilirubin up to 3.3 with normal LFTs.  Benign abdominal exam.  We will follow repeat CMP in the morning and if it continues to be elevated or upward trend.  We will recommend obtaining new CT abdomen and pelvis.

## 2021-11-24 NOTE — Assessment & Plan Note (Signed)
Patient without documented hypoxia but presented with respiratory distress and increased dyspnea due to aggressive progression of lung cancer with near obstruction of right mainstem bronchus and possible radiation pneumonitis from recent radiation treatment last week. -Has been evaluated by pulmonology overnight and they have recommended trying BiPAP for comfort.  They will discuss case with advanced bronchoscopy team and discuss whether tumor debulking can be done here or refer to [facility] or [facility]. -Has received IV Solu-Medrol in the ED, will defer continuation of steroids to pulmonology -Procalcitonin is pending but given he has a rectal temperature of 100.2 F will give initial dose of IV Unasyn for possible postobstructive pneumonia -Continue IV Dilaudid 1 mg PRN and Oxycodone 10mg  PO PRN placed by pulmonary

## 2021-11-24 NOTE — Progress Notes (Signed)
eLink Physician-Brief Progress Note Patient Name: Dylan Good DOB: 14-Jan-1971 MRN: [id number]   Date of Service  11/24/2021  HPI/Events of Note  Patient with advanced, widely metastatic renal cell carcinoma with endobronchial involvement and recurrent shortness of breath and chest pain,  CTA chest was negative for PE but confirmed widely metastatic cancer.  eICU Interventions  New Patient Evaluation. PRN BIPAP for shortness of breath.        Frederik Pear 11/24/2021, 9:48 PM

## 2021-11-24 NOTE — Progress Notes (Signed)
Pt requesting break from BIPAP.  Pt placed on 4 LPM [place] and tolerating well at this time.

## 2021-11-24 NOTE — Assessment & Plan Note (Signed)
Continue amlodipine and metoprolol

## 2021-11-24 NOTE — ED Provider Triage Note (Signed)
Emergency Medicine Provider Triage Evaluation Note  Dylan Good , a 51 y.o. male  was evaluated in triage.  Pt complains of SOB. Hx RCC with metastatic disease to the lungs. Radiation M-Thursday next last week. SOB since radiation treatment. No fevers. Is nauseated with cough and SOB.   Review of Systems  Positive:  Negative:   Physical Exam  There were no vitals taken for this visit. Gen:   Awake, acute distress  Resp:  Tachypnea, rhonchi and rales bilaterally  MSK:   Moves extremities without difficulty  Other:  Ill appearing HR 140s   Medical Decision Making  Medically screening exam initiated at 4:28 PM.  Appropriate orders placed.  Dylan Good was informed that the remainder of the evaluation will be completed by another provider, this initial triage assessment does not replace that evaluation, and the importance of remaining in the ED until their evaluation is complete.  Needs emergent room - triage RN aware    Dylan Hillier, PA-C 11/24/21 1629

## 2021-11-24 NOTE — ED Triage Notes (Addendum)
Patient BIB family, SOB since d/c x2 days ago. Hx of lung cancer. Denies pain. Reports nausea.   Unable to obtain temp in triage due to SOB.

## 2021-11-24 NOTE — Consult Note (Signed)
NAME:  Dylan Good, MRN:  [id number], DOB:  Jun 17, 1971, LOS: 0 ADMISSION DATE:  11/24/2021, CONSULTATION DATE:  11/24/21 REFERRING MD:  Teressa Lower, MD CHIEF COMPLAINT:  Shortness of breath   History of Present Illness:  Dylan Good is a 51 year old male with metastatic renal cell carcinoma the lung, pleura, chest wall and CNS involvement s/p treatment with ipilimumab (4 cycles completed), nivolumab (discontinued due to disease progression) and most recently cabometyx 05/2021 to 10/2021 but stopped due to pancreatitis. He was recently admitted 5/11 to 5/18 for shortness of breath and received 5 treatments of radiation therapy. He returns today with shortness of breath that waxes and wanes along with significant pain. He reports he still had significant dyspnea at time of discharge on 5/18.   CT Chest 11/15/21 shows mediastinal adenopathy with distal trachea narrowing due to endobronchial involvement of tumor invasion along with almost near occlusion of right main stem bronchus due to tumor invasion.   PCCM has been consulted for shortness of breath and metastatic pulmonary disease.   Pertinent  Medical History  Metastatic Renal Cell Carcinoma  Significant Hospital Events: Including procedures, antibiotic start and stop dates in addition to other pertinent events   5/20 admitted  Interim History / Subjective:  As above  Objective   Blood pressure (!) 160/74, pulse (!) 139, resp. rate (!) 28, SpO2 95 %.       No intake or output data in the 24 hours ending 11/24/21 1810 There were no vitals filed for this visit.  Examination: General: young male, mild distress HENT: [place]/AT, sclera anicteric, dry mucous membranes, PERRL Lungs: diminished breath sounds on left, no wheezing Cardiovascular: tachycardic, no murmurs Abdomen: soft, non-tender, non-distended, BS+ Extremities: warm, no edema Neuro: alert, oriented, moving all extremities GU: n/a  Labs & Imaging: Bilirubin 3.3 Na  131 Serum Bicarb 19 CXR: Improved aeration of left lung   Resolved Hospital Problem list     Assessment & Plan:  Metastatic Renal Cell Carcinoma with lung, chest wall and CNS involvement Metabolic Acidosis Mild Hyponatremia Hypercalcemia Hyperbilirubinemia Pain due to metastatic disease  - Near narrowing of the right main stem bronchus based on CT Chest 5/11. He may have component of radiation pneumonitis given recent treatment. He does not appear to have over pneumonia or respiratory infection at this time.  - We will follow up CTA Chest ordered by ER team - ok to try bipap for comfort, otherwise he is oxygenating fine and is not retaining CO2. - check procalcitonin, if elevated add unasyn or zosyn for possible post-obstructive pneumonia - given dose of solumedrol 125mg  in ER, will determine tomorrow after CT scan wheter further treatment with steroids is warranted - start dilaudid 1mg  IV PRN and Oxycodone 10mg  PO PRN for pain control - 1L LR bolus  - Will discuss his case with our advanced bronchoscopy team and whether tumor debulking can be done here or we should refer him to [facility] or [facility].  - Patient to be admitted to the hospitalist team, PCCM to continue to follow  Best Practice (right click and "Reselect all SmartList Selections" daily)   Per primary team  Labs   CBC: Recent Labs  Lab 11/18/21 0535 11/19/21 0455 11/20/21 0533 11/21/21 0531 11/24/21 1642  WBC 13.5* 12.4* 12.6* 12.0* 5.6  NEUTROABS 10.4* 9.7* 9.9* 9.4* 4.2  HGB 10.7* 11.1* 11.3* 11.5* 13.7  HCT 34.8* 36.0* 35.0* 36.1* 42.5  MCV 81.7 81.6 80.6 80.8 81.9  PLT 311 307 289  315 741    Basic Metabolic Panel: Recent Labs  Lab 11/18/21 0535 11/19/21 0455 11/20/21 0533 11/21/21 0531 11/24/21 1737  NA 140 133* 134* 134* 131*  K 3.7 3.5 3.8 4.3 4.8  CL 108 103 103 104 99  CO2 24 24 24 23  19*  GLUCOSE 137* 118* 103* 93 100*  BUN 16 16 15 15 16   CREATININE 0.63 0.56* 0.53* 0.69 0.74  CALCIUM 11.7*  10.8* 10.9* 10.8* 12.2*  MG 2.0 1.9 2.0 2.0  --    GFR: Estimated Creatinine Clearance: 108.1 mL/min (by C-G formula based on SCr of 0.74 mg/dL). Recent Labs  Lab 11/19/21 0455 11/20/21 0533 11/21/21 0531 11/24/21 1642  WBC 12.4* 12.6* 12.0* 5.6    Liver Function Tests: Recent Labs  Lab 11/24/21 1737  AST 18  ALT 17  ALKPHOS 76  BILITOT 3.3*  PROT 8.1  ALBUMIN 3.7   No results for input(s): LIPASE, AMYLASE in the last 168 hours. No results for input(s): AMMONIA in the last 168 hours.  ABG    Component Value Date/Time   HCO3 21.4 11/24/2021 1722   ACIDBASEDEF 3.4 (H) 11/24/2021 1722   O2SAT 56.6 11/24/2021 1722     Coagulation Profile: No results for input(s): INR, PROTIME in the last 168 hours.  Cardiac Enzymes: No results for input(s): CKTOTAL, CKMB, CKMBINDEX, TROPONINI in the last 168 hours.  HbA1C: No results found for: HGBA1C  CBG: No results for input(s): GLUCAP in the last 168 hours.  Review of Systems:   Review of Systems  Constitutional:  Positive for malaise/fatigue. Negative for chills, fever and weight loss.  HENT:  Negative for congestion, sinus pain and sore throat.   Eyes: Negative.   Respiratory:  Positive for cough and shortness of breath. Negative for hemoptysis, sputum production and wheezing.   Cardiovascular:  Positive for chest pain. Negative for palpitations, orthopnea, claudication and leg swelling.  Gastrointestinal:  Negative for abdominal pain, heartburn, nausea and vomiting.  Genitourinary: Negative.   Musculoskeletal:  Negative for joint pain and myalgias.  Skin:  Negative for rash.  Neurological:  Negative for weakness.  Endo/Heme/Allergies: Negative.   Psychiatric/Behavioral: Negative.      Past Medical History:  He,  has a past medical history of Cancer, metastatic to lung [facility]), Hypertension (07/01/2021), and Renal cell adenocarcinoma, left ([place]).   Surgical History:   Past Surgical History:  Procedure Laterality  Date   APPENDECTOMY     BIOPSY  07/03/2021   Procedure: BIOPSY;  Surgeon: Ronnette Juniper, MD;  Location: [facility];  Service: Gastroenterology;;   CHEST TUBE INSERTION Left 02/06/2021   Procedure: INSERTION PLEURAL DRAINAGE CATHETER;  Surgeon: Garner Nash, DO;  Location: [place];  Service: Pulmonary;  Laterality: Left;   CHEST TUBE INSERTION N/A 04/04/2021   Procedure: removal of pleurex cath;  Surgeon: Garner Nash, DO;  Location: [place];  Service: Pulmonary;  Laterality: N/A;   ESOPHAGOGASTRODUODENOSCOPY (EGD) WITH PROPOFOL N/A 07/03/2021   Procedure: ESOPHAGOGASTRODUODENOSCOPY (EGD) WITH PROPOFOL;  Surgeon: Ronnette Juniper, MD;  Location: [facility];  Service: Gastroenterology;  Laterality: N/A;     Social History:   reports that he has been smoking cigarettes. He has been smoking an average of .2 packs per day. He has never used smokeless tobacco. He reports that he does not drink alcohol and does not use drugs.   Family History:  His family history includes Cancer in an other family member; Lupus in his mother.   Allergies No Known Allergies  Home Medications  Prior to Admission medications   Medication Sig Start Date End Date Taking? Authorizing Provider  acetaminophen (TYLENOL) 325 MG tablet Take 2 tablets (650 mg total) by mouth every 6 (six) hours as needed for mild pain (or Fever >/= 101). 10/07/21   Arrien, [name], MD  amLODipine (NORVASC) 5 MG tablet TAKE 1 TABLET (5 MG TOTAL) BY MOUTH DAILY. 07/17/21   [name], MD  dextromethorphan (DELSYM) 30 MG/5ML liquid Take 10 mLs (60 mg total) by mouth 2 (two) times daily as needed for cough (if hycodan not effective). 11/22/21   Barb Merino, MD  HYDROcodone bit-homatropine (HYCODAN) 5-1.5 MG/5ML syrup Take 5 mLs by mouth every 6 (six) hours as needed for cough. 11/05/21   Molpus, John, MD  metoprolol succinate (TOPROL-XL) 25 MG 24 hr tablet Take 25 mg by mouth daily. 09/20/21   [provider]   morphine (MS CONTIN) 15 MG 12 hr tablet Take 1 tablet (15 mg total) by mouth every 12 (twelve) hours for 7 days. 11/22/21 11/29/21  Barb Merino, MD  ondansetron (ZOFRAN) 4 MG tablet Take 1 tablet (4 mg total) by mouth every 6 (six) hours as needed for nausea. Patient not taking: Reported on 11/15/2021 09/16/21   [name], MD  oxyCODONE (OXY IR/ROXICODONE) 5 MG immediate release tablet Take 1 tablet (5 mg total) by mouth every 4 (four) hours as needed for up to 7 days for moderate pain. 11/22/21 11/29/21  Barb Merino, MD  pantoprazole (PROTONIX) 40 MG tablet Take 1 tablet (40 mg total) by mouth daily. Patient taking differently: Take 40 mg by mouth daily as needed (for heartburn). 08/14/21   Eugenie Filler, MD  polyethylene glycol (MIRALAX / GLYCOLAX) 17 g packet Take 17 g by mouth daily. 11/22/21   Barb Merino, MD  SYMBICORT 80-4.5 MCG/ACT inhaler Inhale 2 puffs into the lungs daily as needed (for shortness of breath). 11/12/21   [provider]  VENTOLIN HFA 108 (90 Base) MCG/ACT inhaler Inhale 2 puffs into the lungs every 4 (four) hours as needed for shortness of breath or wheezing. 09/11/21   [provider]     Critical care time: n/a    Freda Jackson, MD [place] Office: [phone]   See Amion for personal pager PCCM on call pager [phone] until 7pm. Please call Elink 7p-7a. [phone]

## 2021-11-24 NOTE — Progress Notes (Signed)
Pt is resting well on 4L [place] at this time. He said he would like to keep bipap off for a while. No resp distress noted. Bipap stand by.

## 2021-11-24 NOTE — H&P (Addendum)
History and Physical    Patient: Dylan Good:[id number] DOB: 1970/09/14 DOA: 11/24/2021 DOS: the patient was seen and examined on 11/24/2021 PCP: Pcp, No  Patient coming from: Home  Chief Complaint:  Chief Complaint  Patient presents with   Shortness of Breath   HPI: Dylan Good is a 51 y.o. 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   He was just discharged 2 days ago from 11/15/21-11/22/21 with shortness of breath. 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 He underwent 5 doses of radiation therapy while inpatient. Oncology recommend palliative treatment with pain control. He was started on MS contin and oxycodone with hydrocodone cough syrup.  Previously was on cabometx until March and discontinued due to pancreatitis.   He presented today with persistent dyspnea both at rest and with exertion.  Has had frequent coughing with related left-sided pleuritic chest pain.  Has low appetite but denies any nausea or vomiting, no diarrhea.  In the ED he had rectal temperature 100.2 F, tachycardic up to 141, mildly hypertensive with SBP of 150 on room air.  No leukocytosis or anemia.  Mild hyponatremia of 131, glucose of 100, calcium remains elevated at 12.2.  LFTs are normal but total bilirubin is elevated at 3.3.  He was evaluated by pulmonology who recommended placing him on BiPAP for comfort.  They will see in consultation and decide whether he can get tumor debulking here versus transfer to [facility] or [facility].  Hospitalist on-call for admission.    Review of Systems: As mentioned in the history of present illness. All  other systems reviewed and are negative. Past Medical History:  Diagnosis Date   Cancer, metastatic to lung [facility])    Hypertension 07/01/2021   Renal cell adenocarcinoma, left [facility])    Past Surgical History:  Procedure Laterality Date   APPENDECTOMY     BIOPSY  07/03/2021   Procedure: BIOPSY;  Surgeon: Ronnette Juniper, MD;  Location: [facility];  Service: Gastroenterology;;   CHEST TUBE INSERTION Left 02/06/2021   Procedure: INSERTION PLEURAL DRAINAGE CATHETER;  Surgeon: Garner Nash, DO;  Location: [place];  Service: Pulmonary;  Laterality: Left;   CHEST TUBE INSERTION N/A 04/04/2021   Procedure: removal of pleurex cath;  Surgeon: Garner Nash, DO;  Location: [place];  Service: Pulmonary;  Laterality: N/A;   ESOPHAGOGASTRODUODENOSCOPY (EGD) WITH PROPOFOL N/A 07/03/2021   Procedure: ESOPHAGOGASTRODUODENOSCOPY (EGD) WITH PROPOFOL;  Surgeon: Ronnette Juniper, MD;  Location: [facility];  Service: Gastroenterology;  Laterality: N/A;   Social History:  reports that he has been smoking cigarettes. He has been smoking an average of .2 packs per day. He has never used smokeless tobacco. He reports that he does not drink alcohol and does not use drugs.  No Known Allergies  Family History  Problem Relation Age of Onset   Lupus Mother    Cancer Other     Prior to Admission medications   Medication Sig Start Date End Date Taking? Authorizing Provider  acetaminophen (TYLENOL) 325 MG tablet Take 2 tablets (650 mg total) by mouth every 6 (six) hours as needed for mild pain (or Fever >/= 101). 10/07/21   Arrien, [name], MD  amLODipine (NORVASC) 5 MG  tablet TAKE 1 TABLET (5 MG TOTAL) BY MOUTH DAILY. 07/17/21   [name], MD  dextromethorphan (DELSYM) 30 MG/5ML liquid Take 10 mLs (60 mg total) by mouth 2 (two) times daily as needed for cough (if hycodan not effective). 11/22/21   Barb Merino, MD  HYDROcodone bit-homatropine (HYCODAN) 5-1.5 MG/5ML syrup Take 5 mLs by mouth every  6 (six) hours as needed for cough. 11/05/21   Molpus, John, MD  metoprolol succinate (TOPROL-XL) 25 MG 24 hr tablet Take 25 mg by mouth daily. 09/20/21   [provider]  morphine (MS CONTIN) 15 MG 12 hr tablet Take 1 tablet (15 mg total) by mouth every 12 (twelve) hours for 7 days. 11/22/21 11/29/21  Barb Merino, MD  ondansetron (ZOFRAN) 4 MG tablet Take 1 tablet (4 mg total) by mouth every 6 (six) hours as needed for nausea. Patient not taking: Reported on 11/15/2021 09/16/21   [name], MD  oxyCODONE (OXY IR/ROXICODONE) 5 MG immediate release tablet Take 1 tablet (5 mg total) by mouth every 4 (four) hours as needed for up to 7 days for moderate pain. 11/22/21 11/29/21  Barb Merino, MD  pantoprazole (PROTONIX) 40 MG tablet Take 1 tablet (40 mg total) by mouth daily. Patient taking differently: Take 40 mg by mouth daily as needed (for heartburn). 08/14/21   Eugenie Filler, MD  polyethylene glycol (MIRALAX / GLYCOLAX) 17 g packet Take 17 g by mouth daily. 11/22/21   Barb Merino, MD  SYMBICORT 80-4.5 MCG/ACT inhaler Inhale 2 puffs into the lungs daily as needed (for shortness of breath). 11/12/21   [provider]  VENTOLIN HFA 108 (90 Base) MCG/ACT inhaler Inhale 2 puffs into the lungs every 4 (four) hours as needed for shortness of breath or wheezing. 09/11/21   [provider]    Physical Exam: Vitals:   11/24/21 1900 11/24/21 2000 11/24/21 2015 11/24/21 2100  BP: (!) 156/74 126/66 (!) 162/74 115/78  Pulse: (!) 125 (!) 115 (!) 126 (!) 120  Resp: (!) 30 (!) 33 (!) 30 20  Temp:    98.3 F (36.8 C)  TempSrc:    Oral  SpO2: 99% 99% 96% 95%   Constitutional: NAD, calm, comfortable, chronically ill appearing patient lying at approximately 20 degree incline in bed Eyes:lids and conjunctivae normal ENMT: Mucous membranes are moist.  Neck: normal, supple, Respiratory: Diminished breath sounds throughout with faint expiratory wheezing and frequent productive cough.   He surprisingly did not appear dyspneic at rest and was able to speak in full sentences while on 4 L via nasal cannula.  No accessory muscle use.  Cardiovascular: Regular rate and rhythm, no murmurs / rubs / gallops. No extremity edema.  Abdomen: no tenderness, no masses palpated.  Bowel sounds positive.  Musculoskeletal: no clubbing / cyanosis. No joint deformity upper and lower extremities. Good ROM, no contractures. Normal muscle tone.  Skin: no rashes, lesions, ulcers.  Neurologic: CN 2-12 grossly intact. Strength 5/5 in all 4.  Psychiatric: Normal judgment and insight. Alert and oriented x 3. Normal mood. Data Reviewed:  See HPI  Assessment and Plan: * Acute respiratory failure with hypoxia ([place]) Patient without documented hypoxia but presented with respiratory distress and increased dyspnea due to aggressive progression of lung cancer with near obstruction of right mainstem bronchus and possible radiation pneumonitis from recent radiation treatment last week. -Has been evaluated by pulmonology overnight and they have recommended trying BiPAP for comfort.  They will discuss case with advanced bronchoscopy team  and discuss whether tumor debulking can be done here or refer to [facility] or [facility]. 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 with oncology -s/p immunotherapy and recently on Cabometyx but discontinued since March due to pancreatitis. -Has been seen by palliative care during last admission.  Had extensive discussion regarding his poor prognosis and likely continue aggressive management of his lung cancer.  He continues to want full resuscitation  measures including mechanical ventilation.  Hypercalcemia of malignancy Remains elevated at 12.2 but stable -receives Zometa infusions outpatient  -will have oncology team consult tomorrow  Serum total bilirubin elevated Elevated total bilirubin up to 3.3 with normal LFTs.  Benign abdominal exam.  We will follow repeat CMP in the morning and if it continues to be elevated or upward trend.  We will recommend obtaining new CT abdomen and pelvis.  Essential hypertension Continue amlodipine and metoprolol      Advance Care Planning:   Code Status: Full Code-had extensive discussion regarding his poor prognosis and likely continual aggressive progression of his lung cancer.  Patient continues to wish to pursue intervention including CPR and mechanical ventilation.  Consults: Pulmonology  Family Communication: Discussed with girlfriend at bedside.  Patient verifies that she is to make medical decisions if he becomes incapacitated but currently is not his legal healthcare power of attorney. Girlfriend was visibly upset and frustrated at bedside.  Plan was discussed extensively and all questions and concerns were addressed.  Severity of Illness: The appropriate patient status for this patient is INPATIENT. Inpatient status is judged to be reasonable and necessary in order to provide the required intensity of service to ensure the patient's safety. The patient's presenting symptoms, physical exam findings, and initial radiographic and laboratory data in the context of their chronic comorbidities is felt to place them at high risk for further clinical deterioration. Furthermore, it is not anticipated that the patient will be medically stable for discharge from the hospital within 2 midnights of admission.   * I certify that at the point of admission it is my clinical judgment that the patient will require inpatient hospital care spanning beyond 2 midnights from the point of admission due to high  intensity of service, high risk for further deterioration and high frequency of surveillance required.*  Author: Orene Desanctis, DO 11/24/2021 9:23 PM  For on call review [email or web address].

## 2021-11-24 NOTE — ED Provider Notes (Signed)
[facility]-ICU/STEPDOWN Provider Note  CSN: [id number] Arrival date & time: 11/24/21 1619  Chief Complaint(s) Shortness of Breath  HPI Dylan Good is a 51 y.o. male with PMH renal cell carcinoma with mets to the lungs and known adenopathy causing bronchus compression who presents emergency department for evaluation of shortness of breath.  Patient was recently discharged 2 days ago after receiving palliative radiation and patient states that his shortness of breath worsened today prompting return to the emergency department.  Patient arrives tachypneic but maintaining his O2 sats on room air.  He denies abdominal pain, nausea, vomiting or other systemic symptoms.   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/16/2021   Goals of care, counseling/discussion    Dyspnea 11/15/2021   GERD (gastroesophageal reflux disease) 11/15/2021   Hyponatremia 10/05/2021   Essential hypertension 10/05/2021   Right nephrolithiasis 10/04/2021   Hypercalcemia of malignancy 09/12/2021   Sinus tachycardia 09/12/2021   Neutropenia ([place]) 08/31/2021   Recent COVID-19 virus infection, possible bacterial pneumonia 08/29/2021   Malignant neoplasm metastatic to lung ([place]) 08/13/2021   Intractable pain 07/01/2021   Hypertension 07/01/2021   Tobacco use 07/01/2021   Acute pancreatitis 07/01/2021   Pleural effusion on left 01/31/2021   Brain metastases 01/24/2021   Hypercalcemia 01/15/2021   Kidney cancer, primary, with  metastasis from kidney to other site [facility]) 12/29/2020   Home Medication(s) Prior to Admission medications   Medication Sig Start Date End Date Taking? Authorizing Provider  acetaminophen (TYLENOL) 325 MG tablet Take 2 tablets (650 mg total) by mouth every 6 (six) hours as needed for mild pain (or Fever >/= 101). 10/07/21   Arrien, [name], MD  amLODipine (NORVASC) 5 MG tablet TAKE 1 TABLET (5 MG TOTAL) BY MOUTH DAILY. 07/17/21   [name], MD  dextromethorphan (DELSYM) 30 MG/5ML liquid Take 10 mLs (60 mg total) by mouth 2 (two) times daily as needed for cough (if hycodan not effective). 11/22/21   Barb Merino, MD  HYDROcodone bit-homatropine (HYCODAN) 5-1.5 MG/5ML syrup Take 5 mLs by mouth every 6 (six) hours as needed for cough. 11/05/21   Molpus, John, MD  metoprolol succinate (TOPROL-XL) 25 MG 24 hr tablet Take 25 mg by mouth daily. 09/20/21   [provider]  morphine (MS CONTIN) 15 MG 12 hr tablet Take 1 tablet (15 mg total) by mouth every 12 (twelve) hours for 7 days. 11/22/21 11/29/21  Barb Merino, MD  ondansetron (ZOFRAN) 4 MG tablet Take 1 tablet (4 mg total) by mouth every 6 (six) hours as needed for nausea. Patient not taking: Reported on 11/15/2021 09/16/21   [name], MD  oxyCODONE (OXY IR/ROXICODONE) 5 MG immediate release tablet Take 1 tablet (5 mg total) by mouth every 4 (four) hours as needed for up to 7 days for moderate pain. 11/22/21 11/29/21  Barb Merino, MD  pantoprazole (PROTONIX) 40 MG tablet Take 1 tablet (40 mg total) by mouth daily. Patient taking differently: Take 40 mg by mouth daily as needed (for heartburn). 08/14/21  Eugenie Filler, MD  polyethylene glycol (MIRALAX / GLYCOLAX) 17 g packet Take 17 g by mouth daily. 11/22/21   Barb Merino, MD  SYMBICORT 80-4.5 MCG/ACT inhaler Inhale 2 puffs into the lungs daily as needed (for shortness of breath). 11/12/21   [provider]  VENTOLIN HFA 108 (90 Base) MCG/ACT inhaler Inhale 2 puffs  into the lungs every 4 (four) hours as needed for shortness of breath or wheezing. 09/11/21   [provider]                                                                                                                                    Past Surgical History Past Surgical History:  Procedure Laterality Date   APPENDECTOMY     BIOPSY  07/03/2021   Procedure: BIOPSY;  Surgeon: Ronnette Juniper, MD;  Location: [facility];  Service: Gastroenterology;;   CHEST TUBE INSERTION Left 02/06/2021   Procedure: INSERTION PLEURAL DRAINAGE CATHETER;  Surgeon: Garner Nash, DO;  Location: [place];  Service: Pulmonary;  Laterality: Left;   CHEST TUBE INSERTION N/A 04/04/2021   Procedure: removal of pleurex cath;  Surgeon: Garner Nash, DO;  Location: [place];  Service: Pulmonary;  Laterality: N/A;   ESOPHAGOGASTRODUODENOSCOPY (EGD) WITH PROPOFOL N/A 07/03/2021   Procedure: ESOPHAGOGASTRODUODENOSCOPY (EGD) WITH PROPOFOL;  Surgeon: Ronnette Juniper, MD;  Location: [facility];  Service: Gastroenterology;  Laterality: N/A;   Family History Family History  Problem Relation Age of Onset   Lupus Mother    Cancer Other     Social History Social History   Tobacco Use   Smoking status: Some Days    Packs/day: 0.20    Types: Cigarettes   Smokeless tobacco: Never  Vaping Use   Vaping Use: Never used  Substance Use Topics   Alcohol use: Never   Drug use: Never   Allergies Patient has no known allergies.  Review of Systems Review of Systems  Respiratory:  Positive for shortness of breath.    Physical Exam Vital Signs  I have reviewed the triage vital signs BP 115/78 (BP Location: Left Arm)   Pulse (!) 120   Temp 98.3 F (36.8 C) (Oral)   Resp 20   Ht 5\' 9"  (1.753 m)   Wt 66.2 kg   SpO2 95%   BMI 21.55 kg/m   Physical Exam Constitutional:      General: He is not in acute distress.    Appearance: Normal appearance. He is ill-appearing.  HENT:     Head: Normocephalic  and atraumatic.     Nose: No congestion or rhinorrhea.  Eyes:     General:        Right eye: No discharge.        Left eye: No discharge.     Extraocular Movements: Extraocular movements intact.     Pupils: Pupils are equal, round, and reactive to light.  Cardiovascular:  Rate and Rhythm: Regular rhythm. Tachycardia present.     Heart sounds: No murmur heard. Pulmonary:     Effort: No respiratory distress.     Breath sounds: Wheezing present. No rales.  Abdominal:     General: There is no distension.     Tenderness: There is no abdominal tenderness.  Musculoskeletal:        General: Normal range of motion.     Cervical back: Normal range of motion.  Skin:    General: Skin is warm and dry.  Neurological:     General: No focal deficit present.     Mental Status: He is alert.    ED Results and Treatments Labs (all labs ordered are listed, but only abnormal results are displayed) Labs Reviewed  CBC WITH DIFFERENTIAL/PLATELET - Abnormal; Notable for the following components:      Result Value   RDW 15.9 (*)    Lymphs Abs 0.6 (*)    All other components within normal limits  BLOOD GAS, VENOUS - Abnormal; Notable for the following components:   pCO2, Ven 37 (*)    pO2, Ven <31 (*)    Acid-base deficit 3.4 (*)    All other components within normal limits  COMPREHENSIVE METABOLIC PANEL - Abnormal; Notable for the following components:   Sodium 131 (*)    CO2 19 (*)    Glucose, Bld 100 (*)    Calcium 12.2 (*)    Total Bilirubin 3.3 (*)    All other components within normal limits  MRSA NEXT GEN BY PCR, NASAL  BRAIN NATRIURETIC PEPTIDE  PROCALCITONIN  COMPREHENSIVE METABOLIC PANEL  CBC  TROPONIN I (HIGH SENSITIVITY)  TROPONIN I (HIGH SENSITIVITY)                                                                                                                          Radiology CT Angio Chest PE W and/or Wo Contrast  Result Date: 11/24/2021 CLINICAL DATA:  51 year old  male with acute shortness of breath. Patient with metastatic renal cell carcinoma. EXAM: CT ANGIOGRAPHY CHEST WITH CONTRAST TECHNIQUE: Multidetector CT imaging of the chest was performed using the standard protocol during bolus administration of intravenous contrast. Multiplanar CT image reconstructions and MIPs were obtained to evaluate the vascular anatomy. RADIATION DOSE REDUCTION: This exam was performed according to the departmental dose-optimization program which includes automated exposure control, adjustment of the mA and/or kV according to patient size and/or use of iterative reconstruction technique. CONTRAST:  176mL OMNIPAQUE IOHEXOL 350 MG/ML SOLN COMPARISON:  11/15/2021 chest CT and prior studies FINDINGS: Cardiovascular: This is a technically adequate study by respiratory motion artifact slightly limits sensitivity in the LOWER lungs. No pulmonary emboli are identified. Heart size is normal. A small pericardial effusion is noted. There is no evidence of thoracic aortic aneurysm. Mediastinum/Nodes: Extensive mediastinal and RIGHT hilar adenopathy is unchanged. LOWER tracheal and RIGHT mainstem bronchus invasion again noted with mucous thin the RIGHT mainstem and some distal RIGHT  LOWER lobe bronchi. Visualized thyroid and esophagus are unremarkable. Lungs/Pleura: Innumerable pulmonary nodules are unchanged compatible with metastatic disease. Larger LEFT LOWER lobe mass/metastasis is unchanged measuring 4.5 cm. Multiple LEFT pleural metastases are again noted with slightly increasing small LEFT pleural effusion. No new pulmonary abnormalities are noted. There is no evidence of pneumothorax or RIGHT pleural effusion. Upper Abdomen: Partial visualization of known RIGHT renal cell carcinoma and bilateral adrenal metastases again identified. No acute abnormalities are identified. Musculoskeletal: LEFT 5th rib metastasis with large soft tissue component measuring up to 8.5 x 7.5 x 6.3 cm again noted. L1  metastasis again noted. No new or acute bony abnormalities are identified. Review of the MIP images confirms the above findings. IMPRESSION: 1. No evidence of pulmonary emboli. 2. Slightly increasing small LEFT pleural effusion without other significant change since 11/15/2021. 3. Unchanged widespread bilateral pulmonary and LEFT pleural metastases, mediastinal and RIGHT hilar adenopathy, LEFT 5th rib and L1 metastases. 4. RIGHT renal cell carcinoma and bilateral adrenal metastases again identified. Electronically Signed   By: Margarette Canada M.D.   On: 11/24/2021 18:50   DG Chest Port 1 View  Result Date: 11/24/2021 CLINICAL DATA:  Shortness of breath, history of lung cancer. EXAM: PORTABLE CHEST 1 VIEW COMPARISON:  Chest radiograph and chest CT dated 11/15/2021. FINDINGS: The heart size and mediastinal contours are within normal limits. Vascular calcifications are seen in the aortic arch. A mass in the lateral left chest wall with associated destruction of the left fifth rib appears unchanged. Nodularity and airspace opacities in the left lung appear similar to 11/15/2021. A small left pleural effusion is unchanged. Pulmonary nodules in the right lung are better characterized on prior CT. There is no right pleural effusion. There is no pneumothorax. IMPRESSION: Metastatic disease involving the left chest wall and both lungs appears similar to 11/15/2021. Aortic Atherosclerosis (ICD10-I70.0). Electronically Signed   By: Zerita Boers M.D.   On: 11/24/2021 18:15    Pertinent labs & imaging results that were available during my care of the patient were reviewed by me and considered in my medical decision making (see MDM for details).  Medications Ordered in ED Medications  HYDROmorphone (DILAUDID) injection 1 mg (1 mg Intravenous Given 11/24/21 1842)  oxyCODONE (Oxy IR/ROXICODONE) immediate release tablet 10 mg (has no administration in time range)  sodium chloride (PF) 0.9 % injection (has no administration  in time range)  ondansetron (ZOFRAN) injection 4 mg (4 mg Intravenous Given 11/24/21 1842)  enoxaparin (LOVENOX) injection 40 mg (40 mg Subcutaneous Given 11/24/21 2119)  ipratropium-albuterol (DUONEB) 0.5-2.5 (3) MG/3ML nebulizer solution 3 mL (has no administration in time range)  lactated ringers infusion ( Intravenous New Bag/Given 11/24/21 2107)  Ampicillin-Sulbactam (UNASYN) 3 g in sodium chloride 0.9 % 100 mL IVPB (3 g Intravenous New Bag/Given 11/24/21 2200)  Chlorhexidine Gluconate Cloth 2 % PADS 6 each (6 each Topical Given 11/24/21 2100)  chlorhexidine (PERIDEX) 0.12 % solution 15 mL (15 mLs Mouth Rinse Given 11/24/21 2119)  MEDLINE mouth rinse (has no administration in time range)  acetaminophen (TYLENOL) tablet 650 mg (has no administration in time range)  ipratropium-albuterol (DUONEB) 0.5-2.5 (3) MG/3ML nebulizer solution 6 mL (6 mLs Nebulization Given 11/24/21 1657)  ipratropium-albuterol (DUONEB) 0.5-2.5 (3) MG/3ML nebulizer solution 6 mL (6 mLs Nebulization Given 11/24/21 1705)  methylPREDNISolone sodium succinate (SOLU-MEDROL) 125 mg/2 mL injection 125 mg (125 mg Intravenous Given 11/24/21 1730)  magnesium sulfate IVPB 2 g 50 mL (0 g Intravenous Stopped 11/24/21 1902)  lactated ringers  bolus 1,000 mL (1,000 mLs Intravenous New Bag/Given 11/24/21 1841)  iohexol (OMNIPAQUE) 350 MG/ML injection 100 mL (100 mLs Intravenous Contrast Given 11/24/21 1817)                                                                                                                                     Procedures .Critical Care Performed by: Teressa Lower, MD Authorized by: Teressa Lower, MD   Critical care provider statement:    Critical care time (minutes):  30   Critical care was necessary to treat or prevent imminent or life-threatening deterioration of the following conditions:  Respiratory failure   Critical care was time spent personally by me on the following activities:  Development of treatment  plan with patient or surrogate, discussions with consultants, evaluation of patient's response to treatment, examination of patient, ordering and review of laboratory studies, ordering and review of radiographic studies, ordering and performing treatments and interventions, pulse oximetry, re-evaluation of patient's condition and review of old charts  (including critical care time)  Medical Decision Making / ED Course   This patient presents to the ED for concern of shortness of breath, this involves an extensive number of treatment options, and is a complaint that carries with it a high risk of complications and morbidity.  The differential diagnosis includes progression of underlying disease, reactive airway disease, pulmonary embolism, pneumonia  MDM: Patient seen emergency room for evaluation of shortness of breath.  Physical exam reveals an ill-appearing cachectic patient with wheezing bilaterally and accessory muscle use.  Laboratory valuation with hyponatremia 131, CO2 19, calcium 12.2 likely hypercalcemia of malignancy, pH 7.37 with no significant hypercarbia.  BNP unremarkable.  Chest x-ray with similar appearing metastatic disease.  CT PE with slightly enlarged pleural effusion but no PE.  Patient given 2 DuoNebs and steroids and on reevaluation his breathing has improved.  I feel the patient likely would benefit from BiPAP for comfort and I consulted pulmonology for assistance who came and evaluated the patient and agrees that BiPAP can be used in the scenario.  They also agree that patient is likely not an appropriate candidate for bronchial stenting.  Pulmonology recommending stepdown admission.  Patient placed on BiPAP for comfort and admitted.   Additional history obtained: -Additional history obtained from significant other -External records from outside source obtained and reviewed including: Chart review including previous notes, labs, imaging, consultation notes   Lab Tests: -I  ordered, reviewed, and interpreted labs.   The pertinent results include:   Labs Reviewed  CBC WITH DIFFERENTIAL/PLATELET - Abnormal; Notable for the following components:      Result Value   RDW 15.9 (*)    Lymphs Abs 0.6 (*)    All other components within normal limits  BLOOD GAS, VENOUS - Abnormal; Notable for the following components:   pCO2, Ven 37 (*)    pO2, Ven <31 (*)    Acid-base deficit 3.4 (*)  All other components within normal limits  COMPREHENSIVE METABOLIC PANEL - Abnormal; Notable for the following components:   Sodium 131 (*)    CO2 19 (*)    Glucose, Bld 100 (*)    Calcium 12.2 (*)    Total Bilirubin 3.3 (*)    All other components within normal limits  MRSA NEXT GEN BY PCR, NASAL  BRAIN NATRIURETIC PEPTIDE  PROCALCITONIN  COMPREHENSIVE METABOLIC PANEL  CBC  TROPONIN I (HIGH SENSITIVITY)  TROPONIN I (HIGH SENSITIVITY)      EKG   EKG Interpretation  Date/Time:  Saturday Nov 24 2021 17:33:35 EDT Ventricular Rate:  134 PR Interval:  158 QRS Duration: 66 QT Interval:  262 QTC Calculation: 392 R Axis:   87 Text Interpretation: Sinus tachycardia Confirmed by Foster ([id number]) on 11/24/2021 11:07:41 PM         Imaging Studies ordered: I ordered imaging studies including chest x-ray, CT PE I independently visualized and interpreted imaging. I agree with the radiologist interpretation   Medicines ordered and prescription drug management: Meds ordered this encounter  Medications   ipratropium-albuterol (DUONEB) 0.5-2.5 (3) MG/3ML nebulizer solution 6 mL   ipratropium-albuterol (DUONEB) 0.5-2.5 (3) MG/3ML nebulizer solution 6 mL   methylPREDNISolone sodium succinate (SOLU-MEDROL) 125 mg/2 mL injection 125 mg    IV methylprednisolone will be converted to either a q12h or q24h frequency with the same total daily dose (TDD).  Ordered Dose: 1 to 125 mg TDD; convert to: TDD q24h.  Ordered Dose: 126 to 250 mg TDD; convert to: TDD div q12h.  Ordered  Dose: >250 mg TDD; DAW.   magnesium sulfate IVPB 2 g 50 mL   lactated ringers bolus 1,000 mL   HYDROmorphone (DILAUDID) injection 1 mg   oxyCODONE (Oxy IR/ROXICODONE) immediate release tablet 10 mg   sodium chloride (PF) 0.9 % injection    [place], [place]: cabinet override   ondansetron (ZOFRAN) injection 4 mg   iohexol (OMNIPAQUE) 350 MG/ML injection 100 mL   enoxaparin (LOVENOX) injection 40 mg   ipratropium-albuterol (DUONEB) 0.5-2.5 (3) MG/3ML nebulizer solution 3 mL   lactated ringers infusion   Ampicillin-Sulbactam (UNASYN) 3 g in sodium chloride 0.9 % 100 mL IVPB    Order Specific Question:   Antibiotic Indication:    Answer:   Aspiration Pneumonia   Chlorhexidine Gluconate Cloth 2 % PADS 6 each   chlorhexidine (PERIDEX) 0.12 % solution 15 mL   MEDLINE mouth rinse   acetaminophen (TYLENOL) tablet 650 mg    -I have reviewed the patients home medicines and have made adjustments as needed  Critical interventions BiPAP, multiple DuoNebs, steroids  Consultations Obtained: I requested consultation with the pulmonologist,  and discussed lab and imaging findings as well as pertinent plan - they recommend: BiPAP for comfort, stepdown admission   Cardiac Monitoring: The patient was maintained on a cardiac monitor.  I personally viewed and interpreted the cardiac monitored which showed an underlying rhythm of: Sinus tachycardia  Social Determinants of Health:  Factors impacting patients care include: none   Reevaluation: After the interventions noted above, I reevaluated the patient and found that they have :improved  Co morbidities that complicate the patient evaluation  Past Medical History:  Diagnosis Date   Cancer, metastatic to lung ([place])    Hypertension 07/01/2021   Renal cell adenocarcinoma, left ([place])       Dispostion: I considered admission for this patient, and due to persistent shortness of breath and need for BiPAP patient will be admitted  Final  Clinical Impression(s) / ED Diagnoses Final diagnoses:  None     @PCDICTATION @    Teressa Lower, MD 11/24/21 2308

## 2021-11-24 NOTE — Assessment & Plan Note (Addendum)
Follows with [name] with oncology -s/p immunotherapy and recently on Cabometyx but discontinued since March due to pancreatitis. -Has been seen by palliative care during last admission.  Had extensive discussion regarding his poor prognosis and likely continue aggressive management of his lung cancer.  He continues to want full resuscitation measures including mechanical ventilation.

## 2021-11-25 ENCOUNTER — Other Ambulatory Visit: Payer: Self-pay

## 2021-11-25 DIAGNOSIS — J9601 Acute respiratory failure with hypoxia: Secondary | ICD-10-CM | POA: Diagnosis not present

## 2021-11-25 LAB — CBC
HCT: 35.3 % — ABNORMAL LOW (ref 39.0–52.0)
Hemoglobin: 11.3 g/dL — ABNORMAL LOW (ref 13.0–17.0)
MCH: 26.3 pg (ref 26.0–34.0)
MCHC: 32 g/dL (ref 30.0–36.0)
MCV: 82.1 fL (ref 80.0–100.0)
Platelets: 284 10*3/uL (ref 150–400)
RBC: 4.3 MIL/uL (ref 4.22–5.81)
RDW: 15.9 % — ABNORMAL HIGH (ref 11.5–15.5)
WBC: 4.2 10*3/uL (ref 4.0–10.5)
nRBC: 0 % (ref 0.0–0.2)

## 2021-11-25 LAB — COMPREHENSIVE METABOLIC PANEL
ALT: 14 U/L (ref 0–44)
AST: 13 U/L — ABNORMAL LOW (ref 15–41)
Albumin: 3.2 g/dL — ABNORMAL LOW (ref 3.5–5.0)
Alkaline Phosphatase: 64 U/L (ref 38–126)
Anion gap: 10 (ref 5–15)
BUN: 16 mg/dL (ref 6–20)
CO2: 20 mmol/L — ABNORMAL LOW (ref 22–32)
Calcium: 11.5 mg/dL — ABNORMAL HIGH (ref 8.9–10.3)
Chloride: 104 mmol/L (ref 98–111)
Creatinine, Ser: 0.8 mg/dL (ref 0.61–1.24)
GFR, Estimated: >60 mL/min (ref >60)
Glucose, Bld: 158 mg/dL — ABNORMAL HIGH (ref 70–99)
Potassium: 5.2 mmol/L — ABNORMAL HIGH (ref 3.5–5.1)
Sodium: 134 mmol/L — ABNORMAL LOW (ref 135–145)
Total Bilirubin: 1.9 mg/dL — ABNORMAL HIGH (ref 0.3–1.2)
Total Protein: 7 g/dL (ref 6.5–8.1)

## 2021-11-25 LAB — PROCALCITONIN: Procalcitonin: <0.1 ng/mL

## 2021-11-25 MED ORDER — SENNOSIDES-DOCUSATE SODIUM 8.6-50 MG PO TABS: 1.0000 | ORAL_TABLET | Freq: Every evening | ORAL | Status: DC | PRN

## 2021-11-25 MED ORDER — TRAZODONE HCL 50 MG PO TABS
50.0000 mg | ORAL_TABLET | Freq: Every evening | ORAL | Status: DC | PRN
  Administered 2021-11-28: 50 mg via ORAL
  Filled 2021-11-25: qty 1

## 2021-11-25 MED ORDER — MORPHINE SULFATE (PF) 2 MG/ML IV SOLN
2.0000 mg | INTRAVENOUS | Status: DC | PRN
  Administered 2021-11-26 – 2021-11-27 (×2): 2 mg via INTRAVENOUS
  Filled 2021-11-25 (×2): qty 1

## 2021-11-25 MED ORDER — METOPROLOL TARTRATE 5 MG/5ML IV SOLN
5.0000 mg | INTRAVENOUS | Status: DC | PRN
Start: 2021-11-25 — End: 2021-11-28

## 2021-11-25 MED ORDER — MORPHINE SULFATE ER 15 MG PO TBCR
15.0000 mg | EXTENDED_RELEASE_TABLET | Freq: Two times a day (BID) | ORAL | Status: DC
  Administered 2021-11-25 – 2021-11-27 (×6): 15 mg via ORAL
  Filled 2021-11-25 (×6): qty 1

## 2021-11-25 MED ORDER — ZOLEDRONIC ACID 4 MG/5ML IV CONC
4.0000 mg | Freq: Once | INTRAVENOUS | Status: AC
  Administered 2021-11-25: 4 mg via INTRAVENOUS
  Filled 2021-11-25: qty 5

## 2021-11-25 MED ORDER — HYDRALAZINE HCL 20 MG/ML IJ SOLN: 10.0000 mg | INTRAMUSCULAR | Status: DC | PRN

## 2021-11-25 MED ORDER — DOCUSATE SODIUM 100 MG PO CAPS
100.0000 mg | ORAL_CAPSULE | Freq: Two times a day (BID) | ORAL | Status: DC
Start: 2021-11-25 — End: 2021-11-28
  Administered 2021-11-25 – 2021-11-27 (×6): 100 mg via ORAL
  Filled 2021-11-25 (×6): qty 1

## 2021-11-25 MED ORDER — GUAIFENESIN 100 MG/5ML PO LIQD
5.0000 mL | ORAL | Status: DC | PRN
Start: 2021-11-25 — End: 2021-11-28
  Administered 2021-11-25 – 2021-11-26 (×5): 5 mL via ORAL
  Filled 2021-11-25 (×5): qty 10

## 2021-11-25 MED ORDER — IPRATROPIUM-ALBUTEROL 0.5-2.5 (3) MG/3ML IN SOLN: 3.0000 mL | RESPIRATORY_TRACT | Status: DC | PRN

## 2021-11-25 MED ORDER — AMLODIPINE BESYLATE 5 MG PO TABS
5.0000 mg | ORAL_TABLET | Freq: Every day | ORAL | Status: DC
Start: 2021-11-25 — End: 2021-11-28
  Administered 2021-11-25 – 2021-11-27 (×3): 5 mg via ORAL
  Filled 2021-11-25 (×3): qty 1

## 2021-11-25 MED ORDER — METOPROLOL SUCCINATE ER 25 MG PO TB24
25.0000 mg | ORAL_TABLET | Freq: Every day | ORAL | Status: DC
  Administered 2021-11-25 – 2021-11-27 (×3): 25 mg via ORAL
  Filled 2021-11-25 (×3): qty 1

## 2021-11-25 MED ORDER — PANTOPRAZOLE SODIUM 40 MG PO TBEC
40.0000 mg | DELAYED_RELEASE_TABLET | Freq: Every day | ORAL | Status: DC
Start: 2021-11-25 — End: 2021-11-28
  Administered 2021-11-25 – 2021-11-27 (×3): 40 mg via ORAL
  Filled 2021-11-25 (×3): qty 1

## 2021-11-25 MED ORDER — MOMETASONE FURO-FORMOTEROL FUM 100-5 MCG/ACT IN AERO
2.0000 | INHALATION_SPRAY | Freq: Two times a day (BID) | RESPIRATORY_TRACT | Status: DC
  Administered 2021-11-25 – 2021-11-28 (×7): 2 via RESPIRATORY_TRACT
  Filled 2021-11-25: qty 8.8

## 2021-11-25 NOTE — Progress Notes (Addendum)
NAME:  Dylan Good, MRN:  [id number], DOB:  Apr 26, 1971, LOS: 1 ADMISSION DATE:  11/24/2021, CONSULTATION DATE:  11/24/21 REFERRING MD:  Teressa Lower, MD CHIEF COMPLAINT:  Shortness of breath   History of Present Illness:  Dylan Good is a 51 year old male with metastatic renal cell carcinoma the lung, pleura, chest wall and CNS involvement s/p treatment with ipilimumab (4 cycles completed), nivolumab (discontinued due to disease progression) and most recently cabometyx 05/2021 to 10/2021 but stopped due to pancreatitis. He was recently admitted 5/11 to 5/18 for shortness of breath and received 5 treatments of radiation therapy. He returns today with shortness of breath that waxes and wanes along with significant pain. He reports he still had significant dyspnea at time of discharge on 5/18.   CT Chest 11/15/21 shows mediastinal adenopathy with distal trachea narrowing due to endobronchial involvement of tumor invasion along with almost near occlusion of right main stem bronchus due to tumor invasion.   PCCM has been consulted for shortness of breath and metastatic pulmonary disease.   Pertinent  Medical History  Metastatic Renal Cell Carcinoma  Significant Hospital Events: Including procedures, antibiotic start and stop dates in addition to other pertinent events   5/20 admitted  Interim History / Subjective:   No acute events overnight.  He reports the bipap helped somewhat with ease of breathing when used His pain is better controlled and he feels better this morning.  Objective   Blood pressure 97/64, pulse 96, temperature (!) 97.1 F (36.2 C), temperature source Axillary, resp. rate (!) 22, height 5\' 9"  (1.753 m), weight 66.2 kg, SpO2 94 %.        Intake/Output Summary (Last 24 hours) at 11/25/2021 0724 Last data filed at 11/25/2021 0400 Gross per 24 hour  Intake 784.35 ml  Output 500 ml  Net 284.35 ml   Filed Weights   11/24/21 2100  Weight: 66.2 kg     Examination: General: young male, no acute distress HENT: [place]/AT, sclera anicteric, moist mucous membranes, PERRL Lungs: rhonchi on right Cardiovascular: rrr, no murmurs Abdomen: soft, non-tender, non-distended, BS+ Extremities: warm, no edema Neuro: alert, oriented, moving all extremities GU: n/a  CTA Chest 11/25/21 1. No evidence of pulmonary emboli. 2. Slightly increasing small LEFT pleural effusion without other significant change since 11/15/2021. 3. Unchanged widespread bilateral pulmonary and LEFT pleural metastases, mediastinal and RIGHT hilar adenopathy, LEFT 5th rib and L1 metastases. 4. RIGHT renal cell carcinoma and bilateral adrenal metastases again identified. 5. Distal trachea and right main stem bronchus invasion of tumor  Resolved Hospital Problem list     Assessment & Plan:  Metastatic Renal Cell Carcinoma with lung, chest wall and CNS involvement Metabolic Acidosis Mild Hyponatremia Hypercalcemia Hyperbilirubinemia Pain due to metastatic disease  - Near complete narrowing of the right main stem bronchus based on CT Chest 5/11 and 5/20.  - ok to continue bipap for comfort, otherwise he is oxygenating fine and is not retaining CO2. - Continue course of unasyn for possible post-obstructive pneumonia - He does not appear to have radiatio pneumonitis, no role for steroids at this time. - continue dilaudid 1mg  IV PRN and Oxycodone 10mg  PO PRN for pain control - Will discuss his case with our advanced bronchoscopy team and whether tumor debulking can be done here or we should refer him to [facility] or [facility].  - safe to transfer out of step down unit - will need oncology consult as patient/family would like more info on next steps in  his cancer care  PCCM to continue to follow  Best Practice (right click and "Reselect all SmartList Selections" daily)   Per primary team  Labs   CBC: Recent Labs  Lab 11/19/21 0455 11/20/21 0533 11/21/21 0531 11/24/21 1642  11/25/21 0249  WBC 12.4* 12.6* 12.0* 5.6 4.2  NEUTROABS 9.7* 9.9* 9.4* 4.2  --   HGB 11.1* 11.3* 11.5* 13.7 11.3*  HCT 36.0* 35.0* 36.1* 42.5 35.3*  MCV 81.6 80.6 80.8 81.9 82.1  PLT 307 289 315 353 284     Basic Metabolic Panel: Recent Labs  Lab 11/19/21 0455 11/20/21 0533 11/21/21 0531 11/24/21 1737 11/25/21 0249  NA 133* 134* 134* 131* 134*  K 3.5 3.8 4.3 4.8 5.2*  CL 103 103 104 99 104  CO2 24 24 23  19* 20*  GLUCOSE 118* 103* 93 100* 158*  BUN 16 15 15 16 16   CREATININE 0.56* 0.53* 0.69 0.74 0.80  CALCIUM 10.8* 10.9* 10.8* 12.2* 11.5*  MG 1.9 2.0 2.0  --   --     GFR: Estimated Creatinine Clearance: 105.7 mL/min (by C-G formula based on SCr of 0.8 mg/dL). Recent Labs  Lab 11/20/21 0533 11/21/21 0531 11/24/21 1642 11/25/21 0249  WBC 12.6* 12.0* 5.6 4.2     Liver Function Tests: Recent Labs  Lab 11/24/21 1737 11/25/21 0249  AST 18 13*  ALT 17 14  ALKPHOS 76 64  BILITOT 3.3* 1.9*  PROT 8.1 7.0  ALBUMIN 3.7 3.2*    No results for input(s): LIPASE, AMYLASE in the last 168 hours. No results for input(s): AMMONIA in the last 168 hours.  ABG    Component Value Date/Time   HCO3 21.4 11/24/2021 1722   ACIDBASEDEF 3.4 (H) 11/24/2021 1722   O2SAT 56.6 11/24/2021 1722      Coagulation Profile: No results for input(s): INR, PROTIME in the last 168 hours.  Cardiac Enzymes: No results for input(s): CKTOTAL, CKMB, CKMBINDEX, TROPONINI in the last 168 hours.  HbA1C: No results found for: HGBA1C  CBG: No results for input(s): GLUCAP in the last 168 hours.    Critical care time: n/a    Freda Jackson, MD [facility] Office: [phone]   See Amion for personal pager PCCM on call pager [phone] until 7pm. Please call Elink 7p-7a. [phone]

## 2021-11-25 NOTE — Plan of Care (Signed)

## 2021-11-25 NOTE — Plan of Care (Signed)
  Problem: Education: Goal: Knowledge of General Education information will improve Description: Including pain rating scale, medication(s)/side effects and non-pharmacologic comfort measures Outcome: Progressing   Problem: Clinical Measurements: Goal: Cardiovascular complication will be avoided Outcome: Progressing   Problem: Nutrition: Goal: Adequate nutrition will be maintained Outcome: Progressing   Problem: Coping: Goal: Level of anxiety will decrease Outcome: Progressing   Problem: Elimination: Goal: Will not experience complications related to urinary retention Outcome: Progressing   Problem: Pain Managment: Goal: General experience of comfort will improve Outcome: Progressing

## 2021-11-25 NOTE — Progress Notes (Signed)
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  Patient was recently discharged on 5/18 due to shortness of breath as well.  His scan has showed worsening metastatic disease in his lungs.  Upon admission his calcium was 12.2, low-grade fever.  Initially had to be placed on BiPAP for comfort   Assessment & Plan:  Principal Problem:   Acute respiratory failure with hypoxia ([place]) Active Problems:   Metastatic renal cell carcinoma to lung (HCC)   Hypercalcemia of malignancy   Essential hypertension   Serum total bilirubin elevated     Assessment and Plan: * Acute respiratory failure with hypoxia ([place]), slowly improving Patient clearly has progression of his metastatic disease, may have small component of radiation pneumonitis.  Trial of BiPAP if necessary.  Pulmonary will discuss case regarding possible debulking of his tumor versus stenting of bronchus.  Overall patient remains to have poor prognosis.  Continue supportive care. Procalcitonin-negative BNP-normal Empiric antibiotic-IV Unasyn; will stop it  Metastatic renal cell carcinoma to lung [facility]) Follows with [name] with oncology, will add him to the treatment team.  -s/p immunotherapy and recently on Cabometyx but discontinued since March due to pancreatitis. Patient did meet with palliative care service during recent admission and wanted full scope of treatment.  Hypercalcemia of malignancy We will give another dose of zoledronic acid today.  Monitor calcium levels.  Improved a little today.  Serum total bilirubin elevated Elevated.  CT showing metastatic disease.  Essential hypertension Continue amlodipine and metoprolol. IV Prn ordered  as well.     DVT prophylaxis: enoxaparin (LOVENOX) injection 40 mg Start: 11/24/21 2200 Code Status: Full Code Family Communication: Significant other at bedside  Status is: Inpatient Remains inpatient appropriate because: Maintain hospital stay to treat for his acute hypoxic respiratory failure requiring supplemental oxygen and as needed BiPAP.     Subjective: Says his shortness of breath is improved, still on 2-3 L nasal cannula.  Does not use any oxygen at home.    Examination:  General exam: Appears calm and comfortable, 2 L nasal cannula Respiratory system: Bilateral rhonchi Cardiovascular system: S1 & S2 heard, RRR. No JVD, murmurs, rubs, gallops or clicks. No pedal edema. Gastrointestinal system: Abdomen is nondistended, soft and nontender. No organomegaly or masses felt. Normal bowel sounds heard. Central nervous system: Alert and oriented. No focal neurological deficits. Extremities: Symmetric 5 x 5 power. Skin: No rashes, lesions or ulcers Psychiatry: Judgement and insight appear normal. Mood & affect appropriate.     Objective: Vitals:   11/25/21 0334 11/25/21 0400 11/25/21 0500 11/25/21 0549  BP: (!) 95/59 130/73 97/64   Pulse: 94 91 96 96  Resp: 16 15 14  (!) 22  Temp:  (!) 97.1 F (36.2 C)    TempSrc:  Axillary    SpO2: 100% 100% 99% 94%  Weight:      Height:        Intake/Output Summary (Last 24 hours) at 11/25/2021 0722 Last data filed at 11/25/2021 0400 Gross per 24 hour  Intake 784.35 ml  Output 500 ml  Net 284.35 ml   Filed Weights   11/24/21 2100  Weight: 66.2 kg     Data Reviewed:   CBC: Recent Labs  Lab 11/19/21 0455 11/20/21 0533  11/21/21 0531 11/24/21 1642 11/25/21 0249  WBC 12.4* 12.6* 12.0* 5.6 4.2  NEUTROABS 9.7* 9.9* 9.4* 4.2  --   HGB 11.1* 11.3* 11.5* 13.7 11.3*  HCT 36.0* 35.0* 36.1* 42.5 35.3*  MCV 81.6 80.6 80.8 81.9 82.1  PLT 307 289 315 353 932   Basic Metabolic Panel: Recent Labs  Lab 11/19/21 0455  11/20/21 0533 11/21/21 0531 11/24/21 1737 11/25/21 0249  NA 133* 134* 134* 131* 134*  K 3.5 3.8 4.3 4.8 5.2*  CL 103 103 104 99 104  CO2 24 24 23  19* 20*  GLUCOSE 118* 103* 93 100* 158*  BUN 16 15 15 16 16   CREATININE 0.56* 0.53* 0.69 0.74 0.80  CALCIUM 10.8* 10.9* 10.8* 12.2* 11.5*  MG 1.9 2.0 2.0  --   --    GFR: Estimated Creatinine Clearance: 105.7 mL/min (by C-G formula based on SCr of 0.8 mg/dL). Liver Function Tests: Recent Labs  Lab 11/24/21 1737 11/25/21 0249  AST 18 13*  ALT 17 14  ALKPHOS 76 64  BILITOT 3.3* 1.9*  PROT 8.1 7.0  ALBUMIN 3.7 3.2*   No results for input(s): LIPASE, AMYLASE in the last 168 hours. No results for input(s): AMMONIA in the last 168 hours. Coagulation Profile: No results for input(s): INR, PROTIME in the last 168 hours. Cardiac Enzymes: No results for input(s): CKTOTAL, CKMB, CKMBINDEX, TROPONINI in the last 168 hours. BNP (last 3 results) No results for input(s): PROBNP in the last 8760 hours. HbA1C: No results for input(s): HGBA1C in the last 72 hours. CBG: No results for input(s): GLUCAP in the last 168 hours. Lipid Profile: No results for input(s): CHOL, HDL, LDLCALC, TRIG, CHOLHDL, LDLDIRECT in the last 72 hours. Thyroid Function Tests: No results for input(s): TSH, T4TOTAL, FREET4, T3FREE, THYROIDAB in the last 72 hours. Anemia Panel: No results for input(s): VITAMINB12, FOLATE, FERRITIN, TIBC, IRON, RETICCTPCT in the last 72 hours. Sepsis Labs: No results for input(s): PROCALCITON, LATICACIDVEN in the last 168 hours.  Recent Results (from the past 240 hour(s))  Resp Panel by RT-PCR (Flu A&B, Covid) Nasopharyngeal Swab     Status: None   Collection Time: 11/15/21  7:27 AM   Specimen: Nasopharyngeal Swab; Nasopharyngeal(NP) swabs in vial transport medium  Result Value Ref Range Status   SARS Coronavirus 2 by RT PCR NEGATIVE NEGATIVE Final    Comment: (NOTE) SARS-CoV-2 target nucleic acids are NOT DETECTED.  The  SARS-CoV-2 RNA is generally detectable in upper respiratory specimens during the acute phase of infection. The lowest concentration of SARS-CoV-2 viral copies this assay can detect is 138 copies/mL. A negative result does not preclude SARS-Cov-2 infection and should not be used as the sole basis for treatment or other patient management decisions. A negative result may occur with  improper specimen collection/handling, submission of specimen other than nasopharyngeal swab, presence of viral mutation(s) within the areas targeted by this assay, and inadequate number of viral copies(<138 copies/mL). A negative result must be combined with clinical observations, patient history, and epidemiological information. The expected result is Negative.  Fact Sheet for Patients:  EntrepreneurPulse.com.au  Fact Sheet for Healthcare Providers:  IncredibleEmployment.be  This test is no t yet approved or cleared by the [place] FDA and  has been authorized for detection and/or diagnosis of SARS-CoV-2 by FDA under an Emergency Use Authorization (EUA). This EUA will remain  in effect (meaning this test can be used) for the duration of the COVID-19 declaration under Section 564(b)(1) of the Act, 21 U.S.C.section 360bbb-3(b)(1),  unless the authorization is terminated  or revoked sooner.       Influenza A by PCR NEGATIVE NEGATIVE Final   Influenza B by PCR NEGATIVE NEGATIVE Final    Comment: (NOTE) The Xpert Xpress SARS-CoV-2/FLU/RSV plus assay is intended as an aid in the diagnosis of influenza from Nasopharyngeal swab specimens and should not be used as a sole basis for treatment. Nasal washings and aspirates are unacceptable for Xpert Xpress SARS-CoV-2/FLU/RSV testing.  Fact Sheet for Patients: EntrepreneurPulse.com.au  Fact Sheet for Healthcare Providers: IncredibleEmployment.be  This test is not yet approved or  cleared by the [place] FDA and has been authorized for detection and/or diagnosis of SARS-CoV-2 by FDA under an Emergency Use Authorization (EUA). This EUA will remain in effect (meaning this test can be used) for the duration of the COVID-19 declaration under Section 564(b)(1) of the Act, 21 U.S.C. section 360bbb-3(b)(1), unless the authorization is terminated or revoked.  Performed at [facility], [place] [address], [place], [place] [address]   MRSA Next Gen by PCR, Nasal     Status: None   Collection Time: 11/24/21  9:23 PM   Specimen: Nasal Mucosa; Nasal Swab  Result Value Ref Range Status   MRSA by PCR Next Gen NOT DETECTED NOT DETECTED Final    Comment: (NOTE) The GeneXpert MRSA Assay (FDA approved for NASAL specimens only), is one component of a comprehensive MRSA colonization surveillance program. It is not intended to diagnose MRSA infection nor to guide or monitor treatment for MRSA infections. Test performance is not FDA approved in patients less than 4 years old. Performed at [facility], [place] [address], [place], [place] [address]          Radiology Studies: CT Angio Chest PE W and/or Wo Contrast  Result Date: 11/24/2021 CLINICAL DATA:  51 year old male with acute shortness of breath. Patient with metastatic renal cell carcinoma. EXAM: CT ANGIOGRAPHY CHEST WITH CONTRAST TECHNIQUE: Multidetector CT imaging of the chest was performed using the standard protocol during bolus administration of intravenous contrast. Multiplanar CT image reconstructions and MIPs were obtained to evaluate the vascular anatomy. RADIATION DOSE REDUCTION: This exam was performed according to the departmental dose-optimization program which includes automated exposure control, adjustment of the mA and/or kV according to patient size and/or use of iterative reconstruction technique. CONTRAST:  158mL OMNIPAQUE IOHEXOL 350 MG/ML SOLN COMPARISON:  11/15/2021  chest CT and prior studies FINDINGS: Cardiovascular: This is a technically adequate study by respiratory motion artifact slightly limits sensitivity in the LOWER lungs. No pulmonary emboli are identified. Heart size is normal. A small pericardial effusion is noted. There is no evidence of thoracic aortic aneurysm. Mediastinum/Nodes: Extensive mediastinal and RIGHT hilar adenopathy is unchanged. LOWER tracheal and RIGHT mainstem bronchus invasion again noted with mucous thin the RIGHT mainstem and some distal RIGHT LOWER lobe bronchi. Visualized thyroid and esophagus are unremarkable. Lungs/Pleura: Innumerable pulmonary nodules are unchanged compatible with metastatic disease. Larger LEFT LOWER lobe mass/metastasis is unchanged measuring 4.5 cm. Multiple LEFT pleural metastases are again noted with slightly increasing small LEFT pleural effusion. No new pulmonary abnormalities are noted. There is no evidence of pneumothorax or RIGHT pleural effusion. Upper Abdomen: Partial visualization of known RIGHT renal cell carcinoma and bilateral adrenal metastases again identified. No acute abnormalities are identified. Musculoskeletal: LEFT 5th rib metastasis with large soft tissue component measuring up to 8.5 x 7.5 x 6.3 cm again noted. L1 metastasis again noted. No new or acute bony abnormalities are identified.  Review of the MIP images confirms the above findings. IMPRESSION: 1. No evidence of pulmonary emboli. 2. Slightly increasing small LEFT pleural effusion without other significant change since 11/15/2021. 3. Unchanged widespread bilateral pulmonary and LEFT pleural metastases, mediastinal and RIGHT hilar adenopathy, LEFT 5th rib and L1 metastases. 4. RIGHT renal cell carcinoma and bilateral adrenal metastases again identified. Electronically Signed   By: Margarette Canada M.D.   On: 11/24/2021 18:50   DG Chest Port 1 View  Result Date: 11/24/2021 CLINICAL DATA:  Shortness of breath, history of lung cancer. EXAM:  PORTABLE CHEST 1 VIEW COMPARISON:  Chest radiograph and chest CT dated 11/15/2021. FINDINGS: The heart size and mediastinal contours are within normal limits. Vascular calcifications are seen in the aortic arch. A mass in the lateral left chest wall with associated destruction of the left fifth rib appears unchanged. Nodularity and airspace opacities in the left lung appear similar to 11/15/2021. A small left pleural effusion is unchanged. Pulmonary nodules in the right lung are better characterized on prior CT. There is no right pleural effusion. There is no pneumothorax. IMPRESSION: Metastatic disease involving the left chest wall and both lungs appears similar to 11/15/2021. Aortic Atherosclerosis (ICD10-I70.0). Electronically Signed   By: Zerita Boers M.D.   On: 11/24/2021 18:15        Scheduled Meds:  chlorhexidine  15 mL Mouth Rinse BID   Chlorhexidine Gluconate Cloth  6 each Topical Daily   enoxaparin (LOVENOX) injection  40 mg Subcutaneous Q24H   mouth rinse  15 mL Mouth Rinse q12n4p   Continuous Infusions:  ampicillin-sulbactam (UNASYN) IV Stopped (11/25/21 0618)   lactated ringers 100 mL/hr at 11/25/21 0547     LOS: 1 day   Time spent= 35 mins    Kazoua Gossen Arsenio Loader, MD [facility]  If 7PM-7AM, please contact night-coverage  11/25/2021, 7:22 AM

## 2021-11-26 ENCOUNTER — Inpatient Hospital Stay: Payer: Medicaid Other

## 2021-11-26 DIAGNOSIS — J9601 Acute respiratory failure with hypoxia: Secondary | ICD-10-CM | POA: Diagnosis not present

## 2021-11-26 LAB — BASIC METABOLIC PANEL
Anion gap: 4 — ABNORMAL LOW (ref 5–15)
BUN: 17 mg/dL (ref 6–20)
CO2: 26 mmol/L (ref 22–32)
Calcium: 10.6 mg/dL — ABNORMAL HIGH (ref 8.9–10.3)
Chloride: 105 mmol/L (ref 98–111)
Creatinine, Ser: 0.73 mg/dL (ref 0.61–1.24)
GFR, Estimated: >60 mL/min (ref >60)
Glucose, Bld: 117 mg/dL — ABNORMAL HIGH (ref 70–99)
Potassium: 3.5 mmol/L (ref 3.5–5.1)
Sodium: 135 mmol/L (ref 135–145)

## 2021-11-26 LAB — LACTIC ACID, PLASMA
Lactic Acid, Venous: 1.2 mmol/L (ref 0.5–1.9)
Lactic Acid, Venous: 0.8 mmol/L (ref 0.5–1.9)

## 2021-11-26 LAB — RESP PANEL BY RT-PCR (FLU A&B, COVID) ARPGX2
Influenza A by PCR: NEGATIVE
Influenza B by PCR: NEGATIVE
SARS Coronavirus 2 by RT PCR: NEGATIVE

## 2021-11-26 LAB — MAGNESIUM: Magnesium: 2.1 mg/dL (ref 1.7–2.4)

## 2021-11-26 MED ORDER — POTASSIUM CHLORIDE CRYS ER 20 MEQ PO TBCR
40.0000 meq | EXTENDED_RELEASE_TABLET | Freq: Once | ORAL | Status: AC
Start: 2021-11-26 — End: 2021-11-26
  Administered 2021-11-26: 40 meq via ORAL
  Filled 2021-11-26: qty 2

## 2021-11-26 NOTE — Progress Notes (Signed)
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  Patient was recently discharged on 5/18 due to shortness of breath as well.  His scan has showed worsening metastatic disease in his lungs.  Upon admission his calcium was 12.2, low-grade fever.  Initially had to be placed on BiPAP for comfort.  Pulmonary to discuss this case regarding stenting versus debulking of his tumor.   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  Trial of BiPAP if necessary.  Pulmonary will discuss case regarding possible debulking of his tumor versus stenting of bronchus.  Overall patient remains to have poor prognosis.  Continue supportive care. Procalcitonin-negative.  Antibiotics stopped. BNP-normal  Metastatic renal cell carcinoma to lung [facility]) Follows with [name] with oncology, added to the treatment team. -s/p immunotherapy and recently on Cabometyx but discontinued since March due to pancreatitis. Patient did meet with palliative care service during recent admission and wanted full scope of treatment.  Hypercalcemia of malignancy Received alendronic acid yesterday, calcium 10.6.  Serum total bilirubin elevated Elevated.  CT showing metastatic disease.  Essential hypertension Continue amlodipine and metoprolol. IV Prn ordered  as well.   Overall poor prognosis. His disease is incurable at this time.   DVT prophylaxis: enoxaparin (LOVENOX) injection 40 mg Start: 11/24/21 2200 Code Status: Full Code Family Communication:   Status is: Inpatient Remains inpatient appropriate because: Not on home oxygen, currently requiring 4 L nasal cannula.  Still awaiting to formulate a plan for ongoing treatment with pulmonary and oncology.     Subjective: Tearful this morning. Tells me he is highly considering hospice option but would like Pulm to rediscuss his options once they speak to a tertiary care center. He understands his dz is incurable.  Examination: Constitutional: Not in acute distress Respiratory: Clear to auscultation bilaterally Cardiovascular: Normal sinus rhythm, no rubs Abdomen: Nontender nondistended good bowel sounds Musculoskeletal: No edema noted Skin: No rashes seen Neurologic: CN 2-12 grossly intact.  And nonfocal Psychiatric: Normal judgment and insight. Alert and oriented x 3. Normal mood.     Objective: Vitals:   11/25/21 2258 11/26/21 0006 11/26/21 0109 11/26/21 0352  BP:  (!) 91/54 110/60 (!) 99/57  Pulse: 99 88 92 88  Resp:  16 18 20   Temp:  98.3 F (36.8 C) 97.7 F (36.5 C) 98.4 F (36.9 C)  TempSrc:  Oral Axillary Oral  SpO2:  99% 100% 99%  Weight:      Height:        Intake/Output Summary (Last 24 hours) at 11/26/2021 0746 Last data filed at 11/25/2021 1400 Gross per 24 hour  Intake 472.8 ml  Output 400 ml  Net 72.8 ml   Filed Weights   11/24/21 2100 11/25/21 1224  Weight: 66.2 kg 67.1 kg     Data Reviewed:   CBC: Recent Labs  Lab 11/20/21 0533 11/21/21 0531 11/24/21 1642 11/25/21 0249  WBC 12.6* 12.0* 5.6 4.2  NEUTROABS 9.9* 9.4* 4.2  --   HGB 11.3* 11.5* 13.7 11.3*  HCT 35.0* 36.1* 42.5 35.3*  MCV 80.6 80.8 81.9 82.1  PLT 289 315 353 259   Basic Metabolic Panel: Recent Labs  Lab 11/20/21 0533 11/21/21 0531 11/24/21 1737 11/25/21 0249  11/26/21 0342  NA 134* 134* 131* 134* 135  K 3.8 4.3 4.8 5.2* 3.5  CL 103 104 99 104 105  CO2 24 23 19* 20* 26  GLUCOSE 103* 93 100* 158* 117*  BUN 15 15 16 16 17   CREATININE 0.53* 0.69 0.74 0.80 0.73  CALCIUM 10.9* 10.8* 12.2* 11.5* 10.6*  MG 2.0 2.0  --   --  2.1   GFR: Estimated Creatinine Clearance: 107.2 mL/min (by C-G formula based on SCr of 0.73 mg/dL). Liver Function Tests: Recent Labs  Lab 11/24/21 1737 11/25/21 0249  AST 18 13*  ALT 17 14  ALKPHOS 76 64  BILITOT 3.3* 1.9*  PROT 8.1 7.0  ALBUMIN 3.7 3.2*   No results for input(s): LIPASE, AMYLASE in the last 168 hours. No results for input(s): AMMONIA in the last 168 hours. Coagulation Profile: No results for input(s): INR, PROTIME in the last 168 hours. Cardiac Enzymes: No results for input(s): CKTOTAL, CKMB, CKMBINDEX, TROPONINI in the last 168 hours. BNP (last 3 results) No results for input(s): PROBNP in the last 8760 hours. HbA1C: No results for input(s): HGBA1C in the last 72 hours. CBG: No results for input(s): GLUCAP in the last 168 hours. Lipid Profile: No results for input(s): CHOL, HDL, LDLCALC, TRIG, CHOLHDL, LDLDIRECT in the last 72 hours. Thyroid Function Tests: No results for input(s): TSH, T4TOTAL, FREET4, T3FREE, THYROIDAB in the last 72 hours. Anemia Panel: No results for input(s): VITAMINB12, FOLATE, FERRITIN, TIBC, IRON, RETICCTPCT in the last 72 hours. Sepsis Labs: Recent Labs  Lab 11/24/21 2120 11/26/21 0120 11/26/21 0342  PROCALCITON <0.10  --   --   LATICACIDVEN  --  1.2 0.8    Recent Results (from the past 240 hour(s))  MRSA Next Gen by PCR, Nasal     Status: None   Collection Time: 11/24/21  9:23 PM   Specimen: Nasal Mucosa; Nasal Swab  Result Value Ref Range Status   MRSA by PCR Next Gen NOT DETECTED NOT DETECTED Final    Comment: (NOTE) The GeneXpert MRSA Assay (FDA approved for NASAL specimens only), is one component of a comprehensive MRSA colonization  surveillance program. It is not intended to diagnose MRSA infection nor to guide or monitor treatment for MRSA infections. Test performance is not FDA approved in patients less than 88 years old. Performed at [facility], [place] [address] [place], [place] [address]          Radiology Studies: CT Angio Chest PE W and/or Wo Contrast  Result Date: 11/24/2021 CLINICAL DATA:  51 year old male with acute shortness of breath. Patient with metastatic renal cell carcinoma. EXAM: CT ANGIOGRAPHY CHEST WITH CONTRAST TECHNIQUE: Multidetector CT imaging of the chest was performed using the standard protocol during bolus administration of intravenous contrast. Multiplanar CT image reconstructions and MIPs were obtained to evaluate the vascular anatomy. RADIATION DOSE REDUCTION: This exam was performed according to the departmental dose-optimization program which includes automated exposure control, adjustment of the mA and/or kV according to patient size and/or use of iterative reconstruction technique. CONTRAST:  160mL OMNIPAQUE IOHEXOL 350 MG/ML SOLN COMPARISON:  11/15/2021 chest CT and prior studies FINDINGS: Cardiovascular: This is a technically  adequate study by respiratory motion artifact slightly limits sensitivity in the LOWER lungs. No pulmonary emboli are identified. Heart size is normal. A small pericardial effusion is noted. There is no evidence of thoracic aortic aneurysm. Mediastinum/Nodes: Extensive mediastinal and RIGHT hilar adenopathy is unchanged. LOWER tracheal and RIGHT mainstem bronchus invasion again noted with mucous thin the RIGHT mainstem and some distal RIGHT LOWER lobe bronchi. Visualized thyroid and esophagus are unremarkable. Lungs/Pleura: Innumerable pulmonary nodules are unchanged compatible with metastatic disease. Larger LEFT LOWER lobe mass/metastasis is unchanged measuring 4.5 cm. Multiple LEFT pleural metastases are again noted with slightly increasing small  LEFT pleural effusion. No new pulmonary abnormalities are noted. There is no evidence of pneumothorax or RIGHT pleural effusion. Upper Abdomen: Partial visualization of known RIGHT renal cell carcinoma and bilateral adrenal metastases again identified. No acute abnormalities are identified. Musculoskeletal: LEFT 5th rib metastasis with large soft tissue component measuring up to 8.5 x 7.5 x 6.3 cm again noted. L1 metastasis again noted. No new or acute bony abnormalities are identified. Review of the MIP images confirms the above findings. IMPRESSION: 1. No evidence of pulmonary emboli. 2. Slightly increasing small LEFT pleural effusion without other significant change since 11/15/2021. 3. Unchanged widespread bilateral pulmonary and LEFT pleural metastases, mediastinal and RIGHT hilar adenopathy, LEFT 5th rib and L1 metastases. 4. RIGHT renal cell carcinoma and bilateral adrenal metastases again identified. Electronically Signed   By: Margarette Canada M.D.   On: 11/24/2021 18:50   DG Chest Port 1 View  Result Date: 11/24/2021 CLINICAL DATA:  Shortness of breath, history of lung cancer. EXAM: PORTABLE CHEST 1 VIEW COMPARISON:  Chest radiograph and chest CT dated 11/15/2021. FINDINGS: The heart size and mediastinal contours are within normal limits. Vascular calcifications are seen in the aortic arch. A mass in the lateral left chest wall with associated destruction of the left fifth rib appears unchanged. Nodularity and airspace opacities in the left lung appear similar to 11/15/2021. A small left pleural effusion is unchanged. Pulmonary nodules in the right lung are better characterized on prior CT. There is no right pleural effusion. There is no pneumothorax. IMPRESSION: Metastatic disease involving the left chest wall and both lungs appears similar to 11/15/2021. Aortic Atherosclerosis (ICD10-I70.0). Electronically Signed   By: Zerita Boers M.D.   On: 11/24/2021 18:15        Scheduled Meds:  amLODipine  5  mg Oral Daily   chlorhexidine  15 mL Mouth Rinse BID   Chlorhexidine Gluconate Cloth  6 each Topical Daily   docusate sodium  100 mg Oral BID   enoxaparin (LOVENOX) injection  40 mg Subcutaneous Q24H   mouth rinse  15 mL Mouth Rinse q12n4p   metoprolol succinate  25 mg Oral Daily   mometasone-formoterol  2 puff Inhalation BID   morphine  15 mg Oral Q12H   pantoprazole  40 mg Oral Daily   potassium chloride  40 mEq Oral Once   Continuous Infusions:     LOS: 2 days   Time spent= 35 mins    Carlin Attridge Arsenio Loader, MD [facility]  If 7PM-7AM, please contact night-coverage  11/26/2021, 7:46 AM

## 2021-11-26 NOTE — TOC Initial Note (Signed)
Transition of Care [facility]) - Initial/Assessment Note    Patient Details  Name: Dylan Good MRN: [id number] Date of Birth: 17-Jul-1970  Transition of Care [facility]) CM/SW Contact:    Leeroy Cha, RN Phone Number: 11/26/2021, 8:29 AM  Clinical Narrative:                  Transition of Care [facility]) Screening Note   Patient Details  Name: Dylan Good Date of Birth: 03/30/1971   Transition of Care [facility]) CM/SW Contact:    Leeroy Cha, RN Phone Number: 11/26/2021, 8:29 AM    Transition of Care Department [facility]) has reviewed patient and no TOC needs have been identified at this time. We will continue to monitor patient advancement through interdisciplinary progression rounds. If new patient transition needs arise, please place a TOC consult.    Expected Discharge Plan: Home/Self Care Barriers to Discharge: No Barriers Identified   Patient Goals and CMS Choice Patient states their goals for this hospitalization and ongoing recovery are:: to go back home CMS Medicare.gov Compare Post Acute Care list provided to:: Patient    Expected Discharge Plan and Services Expected Discharge Plan: Home/Self Care   Discharge Planning Services: CM Consult   Living arrangements for the past 2 months: Apartment                                      Prior Living Arrangements/Services Living arrangements for the past 2 months: Apartment Lives with:: Self Patient language and need for interpreter reviewed:: Yes Do you feel safe going back to the place where you live?: Yes            Criminal Activity/Legal Involvement Pertinent to Current Situation/Hospitalization: No - Comment as needed  Activities of Daily Living Home Assistive Devices/Equipment: Cane (specify quad or straight), Eyeglasses, Shower chair with back ADL Screening (condition at time of admission) Patient's cognitive ability adequate to safely complete daily activities?: Yes Is the patient deaf or have  difficulty hearing?: No Does the patient have difficulty seeing, even when wearing glasses/contacts?: No Does the patient have difficulty concentrating, remembering, or making decisions?: No Patient able to express need for assistance with ADLs?: Yes Does the patient have difficulty dressing or bathing?: No Independently performs ADLs?: No Communication: Independent Dressing (OT): Independent Grooming: Independent Feeding: Independent Bathing: Independent Toileting: Needs assistance Is this a change from baseline?: Pre-admission baseline In/Out Bed: Needs assistance Is this a change from baseline?: Pre-admission baseline Walks in Home: Needs assistance Is this a change from baseline?: Pre-admission baseline Does the patient have difficulty walking or climbing stairs?: Yes Weakness of Legs: None Weakness of Arms/Hands: None  Permission Sought/Granted                  Emotional Assessment Appearance:: Appears stated age     Orientation: : Oriented to Place, Oriented to Self, Oriented to  Time, Oriented to Situation Alcohol / Substance Use: Not Applicable Psych Involvement: No (comment)  Admission diagnosis:  Acute respiratory failure with hypoxia ([place]) [J96.01] Patient Active Problem List   Diagnosis Date Noted   Acute respiratory failure with hypoxia ([place]) 11/24/2021   Serum total bilirubin elevated 11/24/2021   Malnutrition of moderate degree 11/17/2021   Metastatic renal cell carcinoma to lung ([place]) 11/16/2021   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/16/2021   Goals of care, counseling/discussion  Dyspnea 11/15/2021   GERD (gastroesophageal reflux disease) 11/15/2021   Hyponatremia 10/05/2021   Essential hypertension 10/05/2021   Right nephrolithiasis 10/04/2021   Hypercalcemia of malignancy 09/12/2021   Sinus tachycardia 09/12/2021   Neutropenia ([place]) 08/31/2021   Recent COVID-19 virus infection, possible bacterial  pneumonia 08/29/2021   Malignant neoplasm metastatic to lung ([place]) 08/13/2021   Intractable pain 07/01/2021   Hypertension 07/01/2021   Tobacco use 07/01/2021   Acute pancreatitis 07/01/2021   Pleural effusion on left 01/31/2021   Brain metastases 01/24/2021   Hypercalcemia 01/15/2021   Kidney cancer, primary, with metastasis from kidney to other site [facility]) 12/29/2020   PCP:  [name], No Pharmacy:   [facility], [place] - [id number] [place] [address] Phone: [phone] Fax: [phone]     Social Determinants of Health (SDOH) Interventions    Readmission Risk Interventions    10/05/2021   12:10 PM 09/14/2021   10:34 AM  Readmission Risk Prevention Plan  Transportation Screening Complete Complete  PCP or Specialist Appt within 3-5 Days  Complete  HRI or [place]  Complete  Social Work Consult for [place] Planning/Counseling  Complete  Palliative Care Screening  Complete  Medication Review [employer]) Complete Complete  PCP or Specialist appointment within 3-5 days of discharge Complete   HRI or [place] Complete   SW Recovery Care/Counseling Consult Complete   Palliative Care Screening Complete   [place] Not Applicable

## 2021-11-26 NOTE — Discharge Summary (Signed)
Physician Discharge Summary  Dylan Good WYO:[id number] DOB: March 11, 1971 DOA: 11/24/2021  PCP: [name], No  Admit date: 11/24/2021 Discharge date: 11/28/2021  Admitted From: Home Disposition:  Transfer to [place], Dr Penne Lash has accepted the ptn.    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  Patient was recently discharged on 5/18 due to shortness of breath as well.  His scan has showed worsening metastatic disease in his lungs.  Upon admission his calcium was 12.2, low-grade fever.  Initially had to be placed on BiPAP for comfort.  Pulmonary to discuss this case regarding stenting versus debulking of his tumor. Calcium Improved with Zolindronic acid. Pulm Dr Erin Fulling spoke with Dr Penne Lash at [facility] who has accepted the patient for transfer.      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  Trial of BiPAP if necessary.  Pulmonary discussed case with Dr Penne Lash from [facility] regarding possible debulking of his tumor versus stenting of bronchus. Transfer to [facility] for further care.  Procalcitonin-negative.  Antibiotics stopped. BNP-normal   Metastatic renal cell carcinoma to lung [facility]) Follows with [name] with oncology, added to the treatment team. -s/p immunotherapy and recently on Cabometyx but discontinued since March due to  pancreatitis. Patient did meet with palliative care service during recent admission and wanted full scope of treatment.   Hypercalcemia of malignancy Received alendronic acid yesterday, calcium 10.6.   Serum total bilirubin elevated Elevated.  CT showing metastatic disease.   Essential hypertension Continue amlodipine and metoprolol.      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 (HCC)   Hypercalcemia of malignancy   Essential hypertension   Serum total bilirubin elevated      Consultations: Pulm Onc  Subjective: Feels ok no complaints today  Discharge Exam: Vitals:   11/27/21 2024 11/28/21 0607  BP: (!) 99/58 110/66  Pulse: 89 88  Resp: 20 17  Temp: 99.1 F (37.3 C) 98.5 F (36.9 C)  SpO2: 99% 95%   Vitals:   11/27/21 0748 11/27/21 1529 11/27/21 2024 11/28/21 0607  BP:  (!) 95/59 (!) 99/58 110/66  Pulse:  92 89 88  Resp:  18 20 17   Temp:  99.5 F (37.5 C) 99.1 F (37.3 C) 98.5 F (36.9 C)  TempSrc:  Oral Oral Oral  SpO2: 98% 100% 99% 95%  Weight:      Height:        General: Pt is alert, awake, not in acute distress Cardiovascular: RRR, S1/S2 +, no rubs, no gallops Respiratory: CTA bilaterally, no wheezing, no rhonchi Abdominal: Soft, NT, ND, bowel sounds + Extremities: no edema, no cyanosis  Discharge Instructions   Allergies as of 11/28/2021   No Known Allergies      Medication List     STOP taking these medications    HYDROcodone bit-homatropine 5-1.5  MG/5ML syrup Commonly known as: HYCODAN       TAKE these medications    acetaminophen 325 MG tablet Commonly known as: TYLENOL Take 2 tablets (650 mg total) by mouth every 6 (six) hours as needed for mild pain (or Fever >/= 101).   amLODipine 5 MG tablet Commonly known as: NORVASC TAKE 1 TABLET (5 MG TOTAL) BY MOUTH DAILY.   dextromethorphan 30 MG/5ML liquid Commonly  known as: DELSYM Take 10 mLs (60 mg total) by mouth 2 (two) times daily as needed for cough (if hycodan not effective).   metoprolol succinate 25 MG 24 hr tablet Commonly known as: TOPROL-XL Take 25 mg by mouth daily.   morphine 15 MG 12 hr tablet Commonly known as: MS CONTIN Take 1 tablet (15 mg total) by mouth every 12 (twelve) hours for 7 days.   ondansetron 4 MG tablet Commonly known as: ZOFRAN Take 1 tablet (4 mg total) by mouth every 6 (six) hours as needed for nausea.   oxyCODONE 5 MG immediate release tablet Commonly known as: Oxy IR/ROXICODONE Take 1 tablet (5 mg total) by mouth every 4 (four) hours as needed for up to 7 days for moderate pain.   pantoprazole 40 MG tablet Commonly known as: Protonix Take 1 tablet (40 mg total) by mouth daily.   polyethylene glycol 17 g packet Commonly known as: MIRALAX / GLYCOLAX Take 17 g by mouth daily.   Symbicort 80-4.5 MCG/ACT inhaler Generic drug: budesonide-formoterol Inhale 2 puffs into the lungs daily as needed (for shortness of breath).   albuterol (2.5 MG/3ML) 0.083% nebulizer solution Commonly known as: PROVENTIL Take 2.5 mg by nebulization every 6 (six) hours as needed for wheezing or shortness of breath.   Ventolin HFA 108 (90 Base) MCG/ACT inhaler Generic drug: albuterol Inhale 2 puffs into the lungs every 4 (four) hours as needed for shortness of breath or wheezing.        No Known Allergies  You were cared for by a hospitalist during your hospital stay. If you have any questions about your discharge medications or the care you received while you were in the hospital after you are discharged, you can call the unit and asked to speak with the hospitalist on call if the hospitalist that took care of you is not available. Once you are discharged, your primary care physician will handle any further medical issues. Please note that no refills for any discharge medications will be authorized once you are discharged, as  it is imperative that you return to your primary care physician (or establish a relationship with a primary care physician if you do not have one) for your aftercare needs so that they can reassess your need for medications and monitor your lab values.   Procedures/Studies: DG Chest 2 View  Result Date: 11/05/2021 CLINICAL DATA:  Cough for 2 days, history of metastatic disease EXAM: CHEST - 2 VIEW COMPARISON:  09/14/2021, 08/28/2021 FINDINGS: Cardiac shadow is within normal limits. The lungs are well aerated bilaterally. Stable nodular density is noted left lower lobe similar to that seen on prior CT examination. Left-sided pleural based mass lesion is noted with erosion of the fifth rib laterally and anteriorly similar to that seen on prior CT examination consistent with metastatic disease. Minimal left-sided effusion is seen. No other bony abnormality is noted. IMPRESSION: Pleural base mass with erosive changes of the fifth rib consistent with the known history of metastatic disease stable in appearance from prior CT examination and plain film. Small  left pleural effusion is seen. Persistent left basilar nodular changes similar to that seen on prior CT examination consistent with metastatic disease. Electronically Signed   By: Inez Catalina M.D.   On: 11/05/2021 03:53   CT Angio Chest PE W and/or Wo Contrast  Result Date: 11/24/2021 CLINICAL DATA:  51 year old male with acute shortness of breath. Patient with metastatic renal cell carcinoma. EXAM: CT ANGIOGRAPHY CHEST WITH CONTRAST TECHNIQUE: Multidetector CT imaging of the chest was performed using the standard protocol during bolus administration of intravenous contrast. Multiplanar CT image reconstructions and MIPs were obtained to evaluate the vascular anatomy. RADIATION DOSE REDUCTION: This exam was performed according to the departmental dose-optimization program which includes automated exposure control, adjustment of the mA and/or kV according to  patient size and/or use of iterative reconstruction technique. CONTRAST:  136mL OMNIPAQUE IOHEXOL 350 MG/ML SOLN COMPARISON:  11/15/2021 chest CT and prior studies FINDINGS: Cardiovascular: This is a technically adequate study by respiratory motion artifact slightly limits sensitivity in the LOWER lungs. No pulmonary emboli are identified. Heart size is normal. A small pericardial effusion is noted. There is no evidence of thoracic aortic aneurysm. Mediastinum/Nodes: Extensive mediastinal and RIGHT hilar adenopathy is unchanged. LOWER tracheal and RIGHT mainstem bronchus invasion again noted with mucous thin the RIGHT mainstem and some distal RIGHT LOWER lobe bronchi. Visualized thyroid and esophagus are unremarkable. Lungs/Pleura: Innumerable pulmonary nodules are unchanged compatible with metastatic disease. Larger LEFT LOWER lobe mass/metastasis is unchanged measuring 4.5 cm. Multiple LEFT pleural metastases are again noted with slightly increasing small LEFT pleural effusion. No new pulmonary abnormalities are noted. There is no evidence of pneumothorax or RIGHT pleural effusion. Upper Abdomen: Partial visualization of known RIGHT renal cell carcinoma and bilateral adrenal metastases again identified. No acute abnormalities are identified. Musculoskeletal: LEFT 5th rib metastasis with large soft tissue component measuring up to 8.5 x 7.5 x 6.3 cm again noted. L1 metastasis again noted. No new or acute bony abnormalities are identified. Review of the MIP images confirms the above findings. IMPRESSION: 1. No evidence of pulmonary emboli. 2. Slightly increasing small LEFT pleural effusion without other significant change since 11/15/2021. 3. Unchanged widespread bilateral pulmonary and LEFT pleural metastases, mediastinal and RIGHT hilar adenopathy, LEFT 5th rib and L1 metastases. 4. RIGHT renal cell carcinoma and bilateral adrenal metastases again identified. Electronically Signed   By: Margarette Canada M.D.   On:  11/24/2021 18:50   CT Angio Chest PE W and/or Wo Contrast  Result Date: 11/15/2021 CLINICAL DATA:  Cough and shortness of breath with left chest pain. Pulmonary embolism suspected. EXAM: CT ANGIOGRAPHY CHEST WITH CONTRAST TECHNIQUE: Multidetector CT imaging of the chest was performed using the standard protocol during bolus administration of intravenous contrast. Multiplanar CT image reconstructions and MIPs were obtained to evaluate the vascular anatomy. RADIATION DOSE REDUCTION: This exam was performed according to the departmental dose-optimization program which includes automated exposure control, adjustment of the mA and/or kV according to patient size and/or use of iterative reconstruction technique. CONTRAST:  169mL OMNIPAQUE IOHEXOL 350 MG/ML SOLN COMPARISON:  08/28/2021 FINDINGS: Cardiovascular: Normal heart size. No pericardial effusion. Satisfactory opacification of the pulmonary arteries without filling defect. No acute aortic finding. Mediastinum/Nodes: Mediastinal adenopathy, especially in the pre and right paratracheal region, progressed with lower tracheal and right mainstem bronchus invasion causing right mainstem bronchus obstruction. Narrowing of the SVC which remains patent. Lungs/Pleura: Widespread pulmonary metastatic disease with generalized increase size of the numerous pulmonary nodules. Dominant nodule on the right is along the  anterior aspect of the minor fissure and measures 2.2 cm, a few mm larger than before. Mass in the left lower lobe measuring 4.5 cm, definitely increased from before. A left pleural effusion remains small volume with nodularity along the pleura especially at the posterior costophrenic sulcus. Upper Abdomen: No acute finding Musculoskeletal: Left fifth rib metastasis laterally with increased size. The mass measures up to 6.3 cm in thickness on coronal reformats, previously 4.4 cm. Increased size of L1 upper body lucency, presumed metastasis. Review of the MIP  images confirms the above findings. IMPRESSION: 1. Extensive thoracic metastatic disease which has progressed from February 2023. Mediastinal adenopathy has invaded the right mainstem bronchus with obstruction. 2. Negative for pulmonary embolism. Electronically Signed   By: Jorje Guild M.D.   On: 11/15/2021 07:14   DG Chest Port 1 View  Result Date: 11/24/2021 CLINICAL DATA:  Shortness of breath, history of lung cancer. EXAM: PORTABLE CHEST 1 VIEW COMPARISON:  Chest radiograph and chest CT dated 11/15/2021. FINDINGS: The heart size and mediastinal contours are within normal limits. Vascular calcifications are seen in the aortic arch. A mass in the lateral left chest wall with associated destruction of the left fifth rib appears unchanged. Nodularity and airspace opacities in the left lung appear similar to 11/15/2021. A small left pleural effusion is unchanged. Pulmonary nodules in the right lung are better characterized on prior CT. There is no right pleural effusion. There is no pneumothorax. IMPRESSION: Metastatic disease involving the left chest wall and both lungs appears similar to 11/15/2021. Aortic Atherosclerosis (ICD10-I70.0). Electronically Signed   By: Zerita Boers M.D.   On: 11/24/2021 18:15   DG Chest Portable 1 View  Result Date: 11/15/2021 CLINICAL DATA:  Cough EXAM: PORTABLE CHEST 1 VIEW COMPARISON:  Ten days ago FINDINGS: Worsening aeration on the left where there is pulmonary nodularity and pleural fluid by recent CT. Mass of the left chest wall with lateral fifth rib destruction. Upper mediastinal adenopathy. Normal heart size. IMPRESSION: Thoracic metastatic disease with worsening aeration at the left base when compared to radiograph 10 days ago, possible increase of left pleural effusion Electronically Signed   By: Jorje Guild M.D.   On: 11/15/2021 06:03     The results of significant diagnostics from this hospitalization (including imaging, microbiology, ancillary and  laboratory) are listed below for reference.     Microbiology: Recent Results (from the past 240 hour(s))  MRSA Next Gen by PCR, Nasal     Status: None   Collection Time: 11/24/21  9:23 PM   Specimen: Nasal Mucosa; Nasal Swab  Result Value Ref Range Status   MRSA by PCR Next Gen NOT DETECTED NOT DETECTED Final    Comment: (NOTE) The GeneXpert MRSA Assay (FDA approved for NASAL specimens only), is one component of a comprehensive MRSA colonization surveillance program. It is not intended to diagnose MRSA infection nor to guide or monitor treatment for MRSA infections. Test performance is not FDA approved in patients less than 32 years old. Performed at [facility], [place] [address], [place], [place] [address]   Resp Panel by RT-PCR (Flu A&B, Covid) Nasopharyngeal Swab     Status: None   Collection Time: 11/26/21  1:54 PM   Specimen: Nasopharyngeal Swab; Nasopharyngeal(NP) swabs in vial transport medium  Result Value Ref Range Status   SARS Coronavirus 2 by RT PCR NEGATIVE NEGATIVE Final    Comment: (NOTE) SARS-CoV-2 target nucleic acids are NOT DETECTED.  The SARS-CoV-2 RNA is generally detectable  in upper respiratory specimens during the acute phase of infection. The lowest concentration of SARS-CoV-2 viral copies this assay can detect is 138 copies/mL. A negative result does not preclude SARS-Cov-2 infection and should not be used as the sole basis for treatment or other patient management decisions. A negative result may occur with  improper specimen collection/handling, submission of specimen other than nasopharyngeal swab, presence of viral mutation(s) within the areas targeted by this assay, and inadequate number of viral copies(<138 copies/mL). A negative result must be combined with clinical observations, patient history, and epidemiological information. The expected result is Negative.  Fact Sheet for Patients:   EntrepreneurPulse.com.au  Fact Sheet for Healthcare Providers:  IncredibleEmployment.be  This test is no t yet approved or cleared by the [place] FDA and  has been authorized for detection and/or diagnosis of SARS-CoV-2 by FDA under an Emergency Use Authorization (EUA). This EUA will remain  in effect (meaning this test can be used) for the duration of the COVID-19 declaration under Section 564(b)(1) of the Act, 21 U.S.C.section 360bbb-3(b)(1), unless the authorization is terminated  or revoked sooner.       Influenza A by PCR NEGATIVE NEGATIVE Final   Influenza B by PCR NEGATIVE NEGATIVE Final    Comment: (NOTE) The Xpert Xpress SARS-CoV-2/FLU/RSV plus assay is intended as an aid in the diagnosis of influenza from Nasopharyngeal swab specimens and should not be used as a sole basis for treatment. Nasal washings and aspirates are unacceptable for Xpert Xpress SARS-CoV-2/FLU/RSV testing.  Fact Sheet for Patients: EntrepreneurPulse.com.au  Fact Sheet for Healthcare Providers: IncredibleEmployment.be  This test is not yet approved or cleared by the [place] FDA and has been authorized for detection and/or diagnosis of SARS-CoV-2 by FDA under an Emergency Use Authorization (EUA). This EUA will remain in effect (meaning this test can be used) for the duration of the COVID-19 declaration under Section 564(b)(1) of the Act, 21 U.S.C. section 360bbb-3(b)(1), unless the authorization is terminated or revoked.  Performed at [facility], [place] [address] [place], [place] [address]      Labs: BNP (last 3 results) Recent Labs    11/24/21 1642  BNP 32.2   Basic Metabolic Panel: Recent Labs  Lab 11/24/21 1737 11/25/21 0249 11/26/21 0342 11/27/21 0331 11/28/21 0342  NA 131* 134* 135 135 136  K 4.8 5.2* 3.5 4.4 3.9  CL 99 104 105 106 107  CO2 19* 20* 26 25 25   GLUCOSE  100* 158* 117* 109* 103*  BUN 16 16 17 12 9   CREATININE 0.74 0.80 0.73 0.76 0.63  CALCIUM 12.2* 11.5* 10.6* 10.7* 10.2  MG  --   --  2.1 2.1 2.0   Liver Function Tests: Recent Labs  Lab 11/24/21 1737 11/25/21 0249  AST 18 13*  ALT 17 14  ALKPHOS 76 64  BILITOT 3.3* 1.9*  PROT 8.1 7.0  ALBUMIN 3.7 3.2*   No results for input(s): LIPASE, AMYLASE in the last 168 hours. No results for input(s): AMMONIA in the last 168 hours. CBC: Recent Labs  Lab 11/24/21 1642 11/25/21 0249  WBC 5.6 4.2  NEUTROABS 4.2  --   HGB 13.7 11.3*  HCT 42.5 35.3*  MCV 81.9 82.1  PLT 353 284   Cardiac Enzymes: No results for input(s): CKTOTAL, CKMB, CKMBINDEX, TROPONINI in the last 168 hours. BNP: Invalid input(s): POCBNP CBG: No results for input(s): GLUCAP in the last 168 hours. D-Dimer No results for input(s): DDIMER in the last 72 hours. Hgb  A1c No results for input(s): HGBA1C in the last 72 hours. Lipid Profile No results for input(s): CHOL, HDL, LDLCALC, TRIG, CHOLHDL, LDLDIRECT in the last 72 hours. Thyroid function studies No results for input(s): TSH, T4TOTAL, T3FREE, THYROIDAB in the last 72 hours.  Invalid input(s): FREET3 Anemia work up No results for input(s): VITAMINB12, FOLATE, FERRITIN, TIBC, IRON, RETICCTPCT in the last 72 hours. Urinalysis    Component Value Date/Time   COLORURINE YELLOW 10/04/2021 1953   APPEARANCEUR CLEAR 10/04/2021 1953   LABSPEC 1.019 10/04/2021 1953   PHURINE 5.0 10/04/2021 1953   GLUCOSEU NEGATIVE 10/04/2021 1953   HGBUR MODERATE (A) 10/04/2021 1953   BILIRUBINUR NEGATIVE 10/04/2021 1953   KETONESUR NEGATIVE 10/04/2021 1953   PROTEINUR NEGATIVE 10/04/2021 1953   NITRITE NEGATIVE 10/04/2021 1953   LEUKOCYTESUR SMALL (A) 10/04/2021 1953   Sepsis Labs Invalid input(s): PROCALCITONIN,  WBC,  LACTICIDVEN Microbiology Recent Results (from the past 240 hour(s))  MRSA Next Gen by PCR, Nasal     Status: None   Collection Time: 11/24/21  9:23 PM    Specimen: Nasal Mucosa; Nasal Swab  Result Value Ref Range Status   MRSA by PCR Next Gen NOT DETECTED NOT DETECTED Final    Comment: (NOTE) The GeneXpert MRSA Assay (FDA approved for NASAL specimens only), is one component of a comprehensive MRSA colonization surveillance program. It is not intended to diagnose MRSA infection nor to guide or monitor treatment for MRSA infections. Test performance is not FDA approved in patients less than 64 years old. Performed at [facility] [place] [address], [place], [place] [address]   Resp Panel by RT-PCR (Flu A&B, Covid) Nasopharyngeal Swab     Status: None   Collection Time: 11/26/21  1:54 PM   Specimen: Nasopharyngeal Swab; Nasopharyngeal(NP) swabs in vial transport medium  Result Value Ref Range Status   SARS Coronavirus 2 by RT PCR NEGATIVE NEGATIVE Final    Comment: (NOTE) SARS-CoV-2 target nucleic acids are NOT DETECTED.  The SARS-CoV-2 RNA is generally detectable in upper respiratory specimens during the acute phase of infection. The lowest concentration of SARS-CoV-2 viral copies this assay can detect is 138 copies/mL. A negative result does not preclude SARS-Cov-2 infection and should not be used as the sole basis for treatment or other patient management decisions. A negative result may occur with  improper specimen collection/handling, submission of specimen other than nasopharyngeal swab, presence of viral mutation(s) within the areas targeted by this assay, and inadequate number of viral copies(<138 copies/mL). A negative result must be combined with clinical observations, patient history, and epidemiological information. The expected result is Negative.  Fact Sheet for Patients:  EntrepreneurPulse.com.au  Fact Sheet for Healthcare Providers:  IncredibleEmployment.be  This test is no t yet approved or cleared by the [place] FDA and  has been authorized for  detection and/or diagnosis of SARS-CoV-2 by FDA under an Emergency Use Authorization (EUA). This EUA will remain  in effect (meaning this test can be used) for the duration of the COVID-19 declaration under Section 564(b)(1) of the Act, 21 U.S.C.section 360bbb-3(b)(1), unless the authorization is terminated  or revoked sooner.       Influenza A by PCR NEGATIVE NEGATIVE Final   Influenza B by PCR NEGATIVE NEGATIVE Final    Comment: (NOTE) The Xpert Xpress SARS-CoV-2/FLU/RSV plus assay is intended as an aid in the diagnosis of influenza from Nasopharyngeal swab specimens and should not be used as a sole basis for treatment. Nasal washings and aspirates are  unacceptable for Xpert Xpress SARS-CoV-2/FLU/RSV testing.  Fact Sheet for Patients: EntrepreneurPulse.com.au  Fact Sheet for Healthcare Providers: IncredibleEmployment.be  This test is not yet approved or cleared by the [place] FDA and has been authorized for detection and/or diagnosis of SARS-CoV-2 by FDA under an Emergency Use Authorization (EUA). This EUA will remain in effect (meaning this test can be used) for the duration of the COVID-19 declaration under Section 564(b)(1) of the Act, 21 U.S.C. section 360bbb-3(b)(1), unless the authorization is terminated or revoked.  Performed at [facility], [place] [address], [place], [place] [address]      Time coordinating discharge:  I have spent 35 minutes face to face with the patient and on the ward discussing the patients care, assessment, plan and disposition with other care givers. >50% of the time was devoted counseling the patient about the risks and benefits of treatment/Discharge disposition and coordinating care.   SIGNED:   Damita Lack, MD  [facility] 11/28/2021, 7:28 AM   If 7PM-7AM, please contact night-coverage

## 2021-11-26 NOTE — Progress Notes (Signed)
NAME:  Dylan Good, MRN:  [id number], DOB:  06-21-1971, LOS: 2 ADMISSION DATE:  11/24/2021, CONSULTATION DATE:  11/24/21 REFERRING MD:  Teressa Lower, MD CHIEF COMPLAINT:  Shortness of breath   History of Present Illness:  Dylan Good is a 51 year old male with metastatic renal cell carcinoma the lung, pleura, chest wall and CNS involvement s/p treatment with ipilimumab (4 cycles completed), nivolumab (discontinued due to disease progression) and most recently cabometyx 05/2021 to 10/2021 but stopped due to pancreatitis. He was recently admitted 5/11 to 5/18 for shortness of breath and received 5 treatments of radiation therapy. He returns today with shortness of breath that waxes and wanes along with significant pain. He reports he still had significant dyspnea at time of discharge on 5/18.   CT Chest 11/15/21 shows mediastinal adenopathy with distal trachea narrowing due to endobronchial involvement of tumor invasion along with almost near occlusion of right main stem bronchus due to tumor invasion.   PCCM has been consulted for shortness of breath and metastatic pulmonary disease.   Pertinent  Medical History  Metastatic Renal Cell Carcinoma  Significant Hospital Events: Including procedures, antibiotic start and stop dates in addition to other pertinent events   5/20 admitted  Interim History / Subjective:   No acute events overnight.  He reports the bipap helped somewhat with ease of breathing when used His pain is better controlled and he feels better this morning.  Objective   Blood pressure (!) 99/57, pulse 88, temperature 98.4 F (36.9 C), temperature source Oral, resp. rate 20, height 5\' 9"  (1.753 m), weight 67.1 kg, SpO2 97 %.        Intake/Output Summary (Last 24 hours) at 11/26/2021 0825 Last data filed at 11/25/2021 1400 Gross per 24 hour  Intake 372.87 ml  Output 400 ml  Net -27.13 ml   Filed Weights   11/24/21 2100 11/25/21 1224  Weight: 66.2 kg 67.1 kg     Examination: General: young male, no acute distress HENT: [place]/AT, sclera anicteric, moist mucous membranes, PERRL Lungs: rhonchi on right Cardiovascular: rrr, no murmurs Abdomen: soft, non-tender, non-distended, BS+ Extremities: warm, no edema Neuro: alert, oriented, moving all extremities GU: n/a  CTA Chest 11/25/21 1. No evidence of pulmonary emboli. 2. Slightly increasing small LEFT pleural effusion without other significant change since 11/15/2021. 3. Unchanged widespread bilateral pulmonary and LEFT pleural metastases, mediastinal and RIGHT hilar adenopathy, LEFT 5th rib and L1 metastases. 4. RIGHT renal cell carcinoma and bilateral adrenal metastases again identified. 5. Distal trachea and right main stem bronchus invasion of tumor  Resolved Hospital Problem list     Assessment & Plan:  Metastatic Renal Cell Carcinoma with lung, chest wall and CNS involvement Metabolic Acidosis Mild Hyponatremia Hypercalcemia Hyperbilirubinemia Pain due to metastatic disease  - Near complete narrowing of the right main stem bronchus based on CT Chest 5/11 and 5/20.  - ok to continue bipap for comfort, otherwise he is oxygenating fine and is not retaining CO2. - Continue course of unasyn for possible post-obstructive pneumonia - He does not appear to have radiation pneumonitis, no role for steroids at this time. - continue dilaudid 1mg  IV PRN and Oxycodone 10mg  PO PRN for pain control - Patient has been accepted in transfer to [facility] for further evaluation and intervention by their Interventional Pulmonary team for consideration of tumor debulking and stent placement.  PCCM to continue to follow  Best Practice (right click and "Reselect all SmartList Selections" daily)   Per primary team  Labs   CBC: Recent Labs  Lab 11/20/21 0533 11/21/21 0531 11/24/21 1642 11/25/21 0249  WBC 12.6* 12.0* 5.6 4.2  NEUTROABS 9.9* 9.4* 4.2  --   HGB 11.3* 11.5* 13.7 11.3*  HCT 35.0* 36.1*  42.5 35.3*  MCV 80.6 80.8 81.9 82.1  PLT 289 315 353 751    Basic Metabolic Panel: Recent Labs  Lab 11/20/21 0533 11/21/21 0531 11/24/21 1737 11/25/21 0249 11/26/21 0342  NA 134* 134* 131* 134* 135  K 3.8 4.3 4.8 5.2* 3.5  CL 103 104 99 104 105  CO2 24 23 19* 20* 26  GLUCOSE 103* 93 100* 158* 117*  BUN 15 15 16 16 17   CREATININE 0.53* 0.69 0.74 0.80 0.73  CALCIUM 10.9* 10.8* 12.2* 11.5* 10.6*  MG 2.0 2.0  --   --  2.1   GFR: Estimated Creatinine Clearance: 107.2 mL/min (by C-G formula based on SCr of 0.73 mg/dL). Recent Labs  Lab 11/20/21 0533 11/21/21 0531 11/24/21 1642 11/24/21 2120 11/25/21 0249 11/26/21 0120 11/26/21 0342  PROCALCITON  --   --   --  <0.10  --   --   --   WBC 12.6* 12.0* 5.6  --  4.2  --   --   LATICACIDVEN  --   --   --   --   --  1.2 0.8    Liver Function Tests: Recent Labs  Lab 11/24/21 1737 11/25/21 0249  AST 18 13*  ALT 17 14  ALKPHOS 76 64  BILITOT 3.3* 1.9*  PROT 8.1 7.0  ALBUMIN 3.7 3.2*   No results for input(s): LIPASE, AMYLASE in the last 168 hours. No results for input(s): AMMONIA in the last 168 hours.  ABG    Component Value Date/Time   HCO3 21.4 11/24/2021 1722   ACIDBASEDEF 3.4 (H) 11/24/2021 1722   O2SAT 56.6 11/24/2021 1722     Coagulation Profile: No results for input(s): INR, PROTIME in the last 168 hours.  Cardiac Enzymes: No results for input(s): CKTOTAL, CKMB, CKMBINDEX, TROPONINI in the last 168 hours.  HbA1C: No results found for: HGBA1C  CBG: No results for input(s): GLUCAP in the last 168 hours.    Critical care time: n/a    Freda Jackson, MD [facility] Office: [phone]   See Amion for personal pager PCCM on call pager [phone] until 7pm. Please call Elink 7p-7a. [phone]

## 2021-11-26 NOTE — Progress Notes (Addendum)
IP PROGRESS NOTE  Subjective:   Events noted.  Dylan Good was hospitalized again after brief period of discharge due to respiratory failure.  He had received radiation therapy during the previous hospitalization without any significant relief.  He was found to be in respiratory distress related to all pulmonary involvement of metastatic renal cell was normal.  Clinically, he reports similar symptoms of dyspnea, cough and wheezing.  He did get some rest overnight on BiPAP.  Objective:  Vital signs in last 24 hours: Temp:  [97.7 F (36.5 C)-98.6 F (37 C)] 98.4 F (36.9 C) (05/22 0352) Pulse Rate:  [88-110] 88 (05/22 0352) Resp:  [16-26] 20 (05/22 0352) BP: (91-119)/(54-76) 99/57 (05/22 0352) SpO2:  [96 %-100 %] 99 % (05/22 0352) Weight:  [148 lb (67.1 kg)] 148 lb (67.1 kg) (05/21 1224) Weight change: 2 lb 0.9 oz (0.932 kg) Last BM Date : 11/24/21  Intake/Output from previous day: 05/21 0701 - 05/22 0700 In: 472.8 [I.V.:374.8; IV Piggyback:98] Out: 400 [Urine:400] General: Alert, awake without distress. Head: Normocephalic atraumatic. Mouth: mucous membranes moist, pharynx normal without lesions Eyes: No scleral icterus.  Pupils are equal and round reactive to light. Resp: Wheezes and rhonchi bilaterally. Cardio: regular rate and rhythm, S1, S2 normal, no murmur, click, rub or gallop GI: soft, non-tender; bowel sounds normal; no masses,  no organomegaly Musculoskeletal: No joint deformity or effusion. Neurological: No motor, sensory deficits.  Intact deep tendon reflexes. Skin: No rashes or lesions.    Lab Results: Recent Labs    11/24/21 1642 11/25/21 0249  WBC 5.6 4.2  HGB 13.7 11.3*  HCT 42.5 35.3*  PLT 353 284    BMET Recent Labs    11/25/21 0249 11/26/21 0342  NA 134* 135  K 5.2* 3.5  CL 104 105  CO2 20* 26  GLUCOSE 158* 117*  BUN 16 17  CREATININE 0.80 0.73  CALCIUM 11.5* 10.6*    Studies/Results: CT Angio Chest PE W and/or Wo Contrast  Result  Date: 11/24/2021 CLINICAL DATA:  51 year old male with acute shortness of breath. Patient with metastatic renal cell carcinoma. EXAM: CT ANGIOGRAPHY CHEST WITH CONTRAST TECHNIQUE: Multidetector CT imaging of the chest was performed using the standard protocol during bolus administration of intravenous contrast. Multiplanar CT image reconstructions and MIPs were obtained to evaluate the vascular anatomy. RADIATION DOSE REDUCTION: This exam was performed according to the departmental dose-optimization program which includes automated exposure control, adjustment of the mA and/or kV according to patient size and/or use of iterative reconstruction technique. CONTRAST:  169mL OMNIPAQUE IOHEXOL 350 MG/ML SOLN COMPARISON:  11/15/2021 chest CT and prior studies FINDINGS: Cardiovascular: This is a technically adequate study by respiratory motion artifact slightly limits sensitivity in the LOWER lungs. No pulmonary emboli are identified. Heart size is normal. A small pericardial effusion is noted. There is no evidence of thoracic aortic aneurysm. Mediastinum/Nodes: Extensive mediastinal and RIGHT hilar adenopathy is unchanged. LOWER tracheal and RIGHT mainstem bronchus invasion again noted with mucous thin the RIGHT mainstem and some distal RIGHT LOWER lobe bronchi. Visualized thyroid and esophagus are unremarkable. Lungs/Pleura: Innumerable pulmonary nodules are unchanged compatible with metastatic disease. Larger LEFT LOWER lobe mass/metastasis is unchanged measuring 4.5 cm. Multiple LEFT pleural metastases are again noted with slightly increasing small LEFT pleural effusion. No new pulmonary abnormalities are noted. There is no evidence of pneumothorax or RIGHT pleural effusion. Upper Abdomen: Partial visualization of known RIGHT renal cell carcinoma and bilateral adrenal metastases again identified. No acute abnormalities are identified. Musculoskeletal: LEFT  5th rib metastasis with large soft tissue component measuring  up to 8.5 x 7.5 x 6.3 cm again noted. L1 metastasis again noted. No new or acute bony abnormalities are identified. Review of the MIP images confirms the above findings. IMPRESSION: 1. No evidence of pulmonary emboli. 2. Slightly increasing small LEFT pleural effusion without other significant change since 11/15/2021. 3. Unchanged widespread bilateral pulmonary and LEFT pleural metastases, mediastinal and RIGHT hilar adenopathy, LEFT 5th rib and L1 metastases. 4. RIGHT renal cell carcinoma and bilateral adrenal metastases again identified. Electronically Signed   By: Margarette Canada M.D.   On: 11/24/2021 18:50   DG Chest Port 1 View  Result Date: 11/24/2021 CLINICAL DATA:  Shortness of breath, history of lung cancer. EXAM: PORTABLE CHEST 1 VIEW COMPARISON:  Chest radiograph and chest CT dated 11/15/2021. FINDINGS: The heart size and mediastinal contours are within normal limits. Vascular calcifications are seen in the aortic arch. A mass in the lateral left chest wall with associated destruction of the left fifth rib appears unchanged. Nodularity and airspace opacities in the left lung appear similar to 11/15/2021. A small left pleural effusion is unchanged. Pulmonary nodules in the right lung are better characterized on prior CT. There is no right pleural effusion. There is no pneumothorax. IMPRESSION: Metastatic disease involving the left chest wall and both lungs appears similar to 11/15/2021. Aortic Atherosclerosis (ICD10-I70.0). Electronically Signed   By: Zerita Boers M.D.   On: 11/24/2021 18:15    Medications: I have reviewed the patient's current medications.  Assessment/Plan:  51 year old with:  1.  Metastatic renal cell carcinoma diagnosed in June 2022.  He was found to have pulmonary and CNS involvement.  He has progressed on immunotherapy, Cabometyx and Inlyta.  His disease status was updated today and treatment choices were reviewed.  He has limited options from a systemic therapy standpoint  which should likely sequencing oral targeted therapy in the form of Lenvima versus others.  His response has been limited and he has tolerated therapy poorly with recurrent pancreatitis with these class of drugs.  Overall, I feel that systemic therapy will have very limited effect on his pulmonary status.  Given his age and reasonable performance status it is reasonable to consider additional systemic therapy if his respiratory status can be improved by a debulking approach.  He understands if this cannot be done transitioning to hospice would be the next step.  His disease is incurable and any treatment at this point would likely delay the disease progression temporarily with limited life expectancy and dissipated.  2.  Respiratory failure: Related to pulmonary progression of his disease.  He is developing refractory disease in the lung despite multiple agents and systemic therapy is unlikely to give him any additional relief.  If local treatment with debulking procedure can be done that would be certainly favorable.  If this cannot be done transitioning to palliative care would be reasonable.  3.  Prognosis and goals of care: This was discussed today in detail and all questions were the patient and his family were addressed.  I reiterated that this is an incurable malignancy and has been since his diagnosis.  He has developed rather rapid progression of his disease which is indicates a worse prognosis.  The fact that he has developed respiratory failure, refractory disease indicating poor prognosis and limited expectancy.  I reiterated that if no debulking procedure can be done to help his pulmonary status and transitioning to hospice would be preferable.  If his respiratory  status does  improve, then we would consider systemic therapy with marginal benefit at best.  We will continue to follow his progress and address any issues as they might.  35  minutes were dedicated to this visit.  More than 50%  of the time was face-to-face.  The time was spent on reviewing imaging studies, discussing treatment options, discussing prognosis and answering questions regarding future plan.    LOS: 2 days   Dylan Good 11/26/2021, 7:59 AM

## 2021-11-26 NOTE — Progress Notes (Signed)
Pt is currently off of bipap. No resp distress noted.

## 2021-11-26 NOTE — Progress Notes (Signed)
Pt refused to wear bipap tonight. He said he is ok with O2. Told him to let RN know if he changes his mind. Machine remained bedside.

## 2021-11-27 LAB — BASIC METABOLIC PANEL
Anion gap: 4 — ABNORMAL LOW (ref 5–15)
BUN: 12 mg/dL (ref 6–20)
CO2: 25 mmol/L (ref 22–32)
Calcium: 10.7 mg/dL — ABNORMAL HIGH (ref 8.9–10.3)
Chloride: 106 mmol/L (ref 98–111)
Creatinine, Ser: 0.76 mg/dL (ref 0.61–1.24)
GFR, Estimated: >60 mL/min (ref >60)
Glucose, Bld: 109 mg/dL — ABNORMAL HIGH (ref 70–99)
Potassium: 4.4 mmol/L (ref 3.5–5.1)
Sodium: 135 mmol/L (ref 135–145)

## 2021-11-27 LAB — MAGNESIUM: Magnesium: 2.1 mg/dL (ref 1.7–2.4)

## 2021-11-27 NOTE — Progress Notes (Signed)
No complaints. No acute events overnight doing well.  Family at bedside. Vitals are stable. Awaiting transfer to [facility]   Dc Summary done on 11/26/21  Gerlean Ren MD [facility]

## 2021-11-27 NOTE — Progress Notes (Signed)
PCCM Update:  Patient awaiting transfer to [facility]. No further changes per pulmonary at this time.  PCCM will sign off.  Freda Jackson, MD [facility] Office: [phone]   See Amion for personal pager PCCM on call pager [phone] until 7pm. Please call Elink 7p-7a. [phone]

## 2021-11-27 NOTE — Progress Notes (Signed)
Pt refused BiPAP qhs.  Pt states he does not wish to use it while he is waiting for transfer to [facility].  Machine remains in room in case the patient changes his mind.

## 2021-11-28 ENCOUNTER — Encounter: Payer: Medicaid Other | Admitting: Nurse Practitioner

## 2021-11-28 LAB — BASIC METABOLIC PANEL
Anion gap: 4 — ABNORMAL LOW (ref 5–15)
BUN: 9 mg/dL (ref 6–20)
CO2: 25 mmol/L (ref 22–32)
Calcium: 10.2 mg/dL (ref 8.9–10.3)
Chloride: 107 mmol/L (ref 98–111)
Creatinine, Ser: 0.63 mg/dL (ref 0.61–1.24)
GFR, Estimated: >60 mL/min (ref >60)
Glucose, Bld: 103 mg/dL — ABNORMAL HIGH (ref 70–99)
Potassium: 3.9 mmol/L (ref 3.5–5.1)
Sodium: 136 mmol/L (ref 135–145)

## 2021-11-28 LAB — MAGNESIUM: Magnesium: 2 mg/dL (ref 1.7–2.4)

## 2021-11-28 NOTE — TOC Transition Note (Signed)
Transition of Care [facility]) - CM/SW Discharge Note   Patient Details  Name: Dylan Good MRN: [id number] Date of Birth: 06-16-1971  Transition of Care [facility]) CM/SW Contact:  Leeroy Cha, RN Phone Number: 11/28/2021, 7:47 AM   Clinical Narrative:     PATIENT BEING TRANSPORTED TO [place] transport.  Hospital to hospital apacket and forms printed and given to floor unit clerk.  Final next level of care: Home/Self Care Barriers to Discharge: No Barriers Identified   Patient Goals and CMS Choice Patient states their goals for this hospitalization and ongoing recovery are:: to go back home CMS Medicare.gov Compare Post Acute Care list provided to:: Patient    Discharge Placement                       Discharge Plan and Services   Discharge Planning Services: CM Consult                                 Social Determinants of Health (SDOH) Interventions     Readmission Risk Interventions    10/05/2021   12:10 PM 09/14/2021   10:34 AM  Readmission Risk Prevention Plan  Transportation Screening Complete Complete  PCP or Specialist Appt within 3-5 Days  Complete  HRI or [place]  Complete  Social Work Consult for [place] Planning/Counseling  Complete  Palliative Care Screening  Complete  Medication Review [employer]) Complete Complete  PCP or Specialist appointment within 3-5 days of discharge Complete   HRI or [place] Complete   SW Recovery Care/Counseling Consult Complete   Palliative Care Screening Complete   [place] Not Applicable

## 2021-11-28 NOTE — Progress Notes (Signed)
Patient transferred to [facility] this morning for further management.  Gerlean Ren MD [facility]

## 2021-11-28 NOTE — Progress Notes (Addendum)
Dylan Good from [facility] center called this morning at 0630 to inform staff at [facility] that [facility] transport has this patient for pick up 1st this am. No ETA was provided. Oncoming [facility] nurse to call report to day shift nurse Betsy  at [phone].

## 2021-11-28 NOTE — Progress Notes (Signed)
Rec'd call from Lovena Le, MD from [place]. Call to inquire about 1429 ETA. Made Lovena Le aware [facility] transport will be picking patient up in am. They are currently behind schedule for pickups. No additional questioned asked.

## 2021-11-28 NOTE — Progress Notes (Signed)
Patient is awaiting to be transported to [place] center by prescheduled transport  ([facility]). CN called to confirmed pick up ETA and was informed by [facility] transportation that they were behind with pickups and will not be there to pick up 1429 until morning. Made oncall APP aware.

## 2021-11-30 ENCOUNTER — Telehealth: Payer: Self-pay | Admitting: *Deleted

## 2021-11-30 NOTE — Telephone Encounter (Signed)
LM for Dylan Good that Dr Alen Blew does agree with Hospice decision.  Notified Mr Wachter that Dr Alen Blew agrees with Hospice decision

## 2021-11-30 NOTE — Telephone Encounter (Signed)
Dylan Good from [facility] called. [facility] referral was placed by [facility]. Dylan Good wants to check with Dr Alen Blew to see if he agrees with Hospice decision.

## 2021-12-04 ENCOUNTER — Other Ambulatory Visit: Payer: Medicaid Other

## 2021-12-05 ENCOUNTER — Telehealth: Payer: Self-pay | Admitting: *Deleted

## 2021-12-05 NOTE — Telephone Encounter (Signed)
PC to patient, no answer, left VM - informed patient we have been notified that he has started hospice care.  His appointments here at [facility] will be canceled.  Instructed patient to call this office with any questions, [phone].

## 2021-12-28 ENCOUNTER — Ambulatory Visit: Payer: Self-pay | Admitting: Radiation Oncology

## 2022-01-02 ENCOUNTER — Other Ambulatory Visit: Payer: Medicaid Other

## 2022-01-02 ENCOUNTER — Ambulatory Visit (HOSPITAL_COMMUNITY)
Admission: RE | Admit: 2022-01-02 | Discharge: 2022-01-02 | Disposition: A | Discharge status: Home / Self Care | Payer: Medicaid Other | Source: Ambulatory Visit | Attending: Oncology | Admitting: Oncology

## 2022-01-02 DIAGNOSIS — C641 Malignant neoplasm of right kidney, except renal pelvis: Secondary | ICD-10-CM

## 2022-01-02 MED ORDER — IOHEXOL 300 MG/ML  SOLN
100.0000 mL | Freq: Once | INTRAMUSCULAR | Status: AC | PRN
  Administered 2022-01-02: 100 mL via INTRAVENOUS

## 2022-01-02 MED ORDER — SODIUM CHLORIDE (PF) 0.9 % IJ SOLN
INTRAMUSCULAR | Status: AC
  Filled 2022-01-02: qty 50

## 2022-01-09 ENCOUNTER — Ambulatory Visit: Payer: Medicaid Other

## 2022-01-09 ENCOUNTER — Ambulatory Visit: Payer: Medicaid Other | Admitting: Oncology

## 2022-01-22 ENCOUNTER — Other Ambulatory Visit: Payer: Self-pay

## 2022-01-22 ENCOUNTER — Encounter (HOSPITAL_COMMUNITY): Payer: Self-pay | Admitting: Emergency Medicine

## 2022-01-22 ENCOUNTER — Encounter: Payer: Self-pay | Admitting: Oncology

## 2022-01-22 ENCOUNTER — Inpatient Hospital Stay (HOSPITAL_COMMUNITY)
Admission: EM | Admit: 2022-01-22 | Discharge: 2022-01-25 | DRG: 392 | Disposition: A | Discharge status: Home / Self Care | Payer: Medicaid Other | Attending: Internal Medicine | Admitting: Internal Medicine

## 2022-01-22 ENCOUNTER — Emergency Department (HOSPITAL_COMMUNITY): Payer: Medicaid Other

## 2022-01-22 ENCOUNTER — Telehealth: Payer: Self-pay

## 2022-01-22 DIAGNOSIS — R1033 Periumbilical pain: Secondary | ICD-10-CM | POA: Diagnosis not present

## 2022-01-22 DIAGNOSIS — R112 Nausea with vomiting, unspecified: Secondary | ICD-10-CM | POA: Diagnosis not present

## 2022-01-22 DIAGNOSIS — C7951 Secondary malignant neoplasm of bone: Secondary | ICD-10-CM | POA: Diagnosis present

## 2022-01-22 DIAGNOSIS — I10 Essential (primary) hypertension: Secondary | ICD-10-CM | POA: Diagnosis present

## 2022-01-22 DIAGNOSIS — C7971 Secondary malignant neoplasm of right adrenal gland: Secondary | ICD-10-CM | POA: Diagnosis present

## 2022-01-22 DIAGNOSIS — E872 Acidosis, unspecified: Secondary | ICD-10-CM

## 2022-01-22 DIAGNOSIS — C642 Malignant neoplasm of left kidney, except renal pelvis: Secondary | ICD-10-CM | POA: Diagnosis present

## 2022-01-22 DIAGNOSIS — I700 Atherosclerosis of aorta: Secondary | ICD-10-CM | POA: Diagnosis present

## 2022-01-22 DIAGNOSIS — F17210 Nicotine dependence, cigarettes, uncomplicated: Secondary | ICD-10-CM | POA: Diagnosis present

## 2022-01-22 DIAGNOSIS — R1013 Epigastric pain: Principal | ICD-10-CM | POA: Diagnosis present

## 2022-01-22 DIAGNOSIS — K859 Acute pancreatitis without necrosis or infection, unspecified: Secondary | ICD-10-CM | POA: Diagnosis present

## 2022-01-22 DIAGNOSIS — R109 Unspecified abdominal pain: Secondary | ICD-10-CM | POA: Diagnosis present

## 2022-01-22 DIAGNOSIS — C7802 Secondary malignant neoplasm of left lung: Secondary | ICD-10-CM | POA: Diagnosis present

## 2022-01-22 DIAGNOSIS — D638 Anemia in other chronic diseases classified elsewhere: Secondary | ICD-10-CM | POA: Diagnosis present

## 2022-01-22 DIAGNOSIS — Z832 Family history of diseases of the blood and blood-forming organs and certain disorders involving the immune mechanism: Secondary | ICD-10-CM

## 2022-01-22 DIAGNOSIS — C7972 Secondary malignant neoplasm of left adrenal gland: Secondary | ICD-10-CM | POA: Diagnosis present

## 2022-01-22 DIAGNOSIS — Z20822 Contact with and (suspected) exposure to covid-19: Secondary | ICD-10-CM | POA: Diagnosis present

## 2022-01-22 DIAGNOSIS — E860 Dehydration: Secondary | ICD-10-CM | POA: Diagnosis present

## 2022-01-22 DIAGNOSIS — Z79899 Other long term (current) drug therapy: Secondary | ICD-10-CM

## 2022-01-22 DIAGNOSIS — C7801 Secondary malignant neoplasm of right lung: Secondary | ICD-10-CM | POA: Diagnosis present

## 2022-01-22 DIAGNOSIS — N400 Enlarged prostate without lower urinary tract symptoms: Secondary | ICD-10-CM | POA: Diagnosis present

## 2022-01-22 DIAGNOSIS — Z7951 Long term (current) use of inhaled steroids: Secondary | ICD-10-CM

## 2022-01-22 DIAGNOSIS — Z9049 Acquired absence of other specified parts of digestive tract: Secondary | ICD-10-CM

## 2022-01-22 DIAGNOSIS — R000 Tachycardia, unspecified: Secondary | ICD-10-CM

## 2022-01-22 DIAGNOSIS — E876 Hypokalemia: Secondary | ICD-10-CM | POA: Diagnosis present

## 2022-01-22 LAB — COMPREHENSIVE METABOLIC PANEL
ALT: 15 U/L (ref 0–44)
AST: 13 U/L — ABNORMAL LOW (ref 15–41)
Albumin: 3.3 g/dL — ABNORMAL LOW (ref 3.5–5.0)
Alkaline Phosphatase: 61 U/L (ref 38–126)
Anion gap: 10 (ref 5–15)
BUN: 8 mg/dL (ref 6–20)
CO2: 23 mmol/L (ref 22–32)
Calcium: 11 mg/dL — ABNORMAL HIGH (ref 8.9–10.3)
Chloride: 102 mmol/L (ref 98–111)
Creatinine, Ser: 0.6 mg/dL — ABNORMAL LOW (ref 0.61–1.24)
GFR, Estimated: >60 mL/min (ref >60)
Glucose, Bld: 134 mg/dL — ABNORMAL HIGH (ref 70–99)
Potassium: 2.9 mmol/L — ABNORMAL LOW (ref 3.5–5.1)
Sodium: 135 mmol/L (ref 135–145)
Total Bilirubin: 0.6 mg/dL (ref 0.3–1.2)
Total Protein: 7.2 g/dL (ref 6.5–8.1)

## 2022-01-22 LAB — CBC
HCT: 38.1 % — ABNORMAL LOW (ref 39.0–52.0)
Hemoglobin: 12.2 g/dL — ABNORMAL LOW (ref 13.0–17.0)
MCH: 24.8 pg — ABNORMAL LOW (ref 26.0–34.0)
MCHC: 32 g/dL (ref 30.0–36.0)
MCV: 77.4 fL — ABNORMAL LOW (ref 80.0–100.0)
Platelets: 337 10*3/uL (ref 150–400)
RBC: 4.92 MIL/uL (ref 4.22–5.81)
RDW: 16.8 % — ABNORMAL HIGH (ref 11.5–15.5)
WBC: 5.9 10*3/uL (ref 4.0–10.5)
nRBC: 0 % (ref 0.0–0.2)

## 2022-01-22 LAB — URINALYSIS, ROUTINE W REFLEX MICROSCOPIC
Bacteria, UA: NONE SEEN
Bilirubin Urine: NEGATIVE
Glucose, UA: NEGATIVE mg/dL
Ketones, ur: NEGATIVE mg/dL
Nitrite: NEGATIVE
Protein, ur: NEGATIVE mg/dL
RBC / HPF: >50 RBC/hpf — ABNORMAL HIGH (ref 0–5)
Specific Gravity, Urine: 1.013 (ref 1.005–1.030)
pH: 6 (ref 5.0–8.0)

## 2022-01-22 LAB — LIPASE, BLOOD: Lipase: 24 U/L (ref 11–51)

## 2022-01-22 LAB — MAGNESIUM: Magnesium: 1.5 mg/dL — ABNORMAL LOW (ref 1.7–2.4)

## 2022-01-22 LAB — LACTIC ACID, PLASMA
Lactic Acid, Venous: 2.7 mmol/L (ref 0.5–1.9)
Lactic Acid, Venous: 2.1 mmol/L (ref 0.5–1.9)

## 2022-01-22 MED ORDER — ALBUTEROL SULFATE (2.5 MG/3ML) 0.083% IN NEBU: 2.5000 mg | INHALATION_SOLUTION | Freq: Four times a day (QID) | RESPIRATORY_TRACT | Status: DC | PRN

## 2022-01-22 MED ORDER — POTASSIUM CHLORIDE CRYS ER 20 MEQ PO TBCR
40.0000 meq | EXTENDED_RELEASE_TABLET | Freq: Once | ORAL | Status: AC
  Administered 2022-01-22: 40 meq via ORAL
  Filled 2022-01-22: qty 2

## 2022-01-22 MED ORDER — SODIUM CHLORIDE 0.9 % IV SOLN: INTRAVENOUS | Status: DC

## 2022-01-22 MED ORDER — HYDROMORPHONE HCL 1 MG/ML IJ SOLN
0.5000 mg | Freq: Once | INTRAMUSCULAR | Status: AC
  Administered 2022-01-22: 0.5 mg via INTRAVENOUS
  Filled 2022-01-22: qty 0.5

## 2022-01-22 MED ORDER — ENOXAPARIN SODIUM 40 MG/0.4ML IJ SOSY
40.0000 mg | PREFILLED_SYRINGE | INTRAMUSCULAR | Status: DC
  Administered 2022-01-23 – 2022-01-25 (×3): 40 mg via SUBCUTANEOUS
  Filled 2022-01-22 (×3): qty 0.4

## 2022-01-22 MED ORDER — SODIUM CHLORIDE 0.9 % IV SOLN
INTRAVENOUS | Status: AC
Start: 2022-01-23 — End: 2022-01-24

## 2022-01-22 MED ORDER — MORPHINE SULFATE (PF) 4 MG/ML IV SOLN
4.0000 mg | Freq: Once | INTRAVENOUS | Status: AC
  Administered 2022-01-22: 4 mg via INTRAVENOUS
  Filled 2022-01-22: qty 1

## 2022-01-22 MED ORDER — MOMETASONE FURO-FORMOTEROL FUM 100-5 MCG/ACT IN AERO
2.0000 | INHALATION_SPRAY | Freq: Two times a day (BID) | RESPIRATORY_TRACT | Status: DC
  Administered 2022-01-23 – 2022-01-25 (×5): 2 via RESPIRATORY_TRACT
  Filled 2022-01-22: qty 8.8

## 2022-01-22 MED ORDER — IOHEXOL 300 MG/ML  SOLN
100.0000 mL | Freq: Once | INTRAMUSCULAR | Status: AC | PRN
  Administered 2022-01-22: 100 mL via INTRAVENOUS

## 2022-01-22 MED ORDER — LACTATED RINGERS IV BOLUS
1000.0000 mL | Freq: Once | INTRAVENOUS | Status: AC
  Administered 2022-01-22: 1000 mL via INTRAVENOUS

## 2022-01-22 MED ORDER — HYDROMORPHONE HCL 1 MG/ML IJ SOLN
0.5000 mg | INTRAMUSCULAR | Status: DC | PRN
  Administered 2022-01-23 – 2022-01-25 (×15): 0.5 mg via INTRAVENOUS
  Filled 2022-01-22 (×15): qty 0.5

## 2022-01-22 MED ORDER — AMLODIPINE BESYLATE 5 MG PO TABS
5.0000 mg | ORAL_TABLET | Freq: Every day | ORAL | Status: DC
  Filled 2022-01-22 (×2): qty 1

## 2022-01-22 MED ORDER — MAGNESIUM SULFATE 2 GM/50ML IV SOLN
2.0000 g | Freq: Once | INTRAVENOUS | Status: AC
Start: 2022-01-22 — End: 2022-01-22
  Administered 2022-01-22: 2 g via INTRAVENOUS
  Filled 2022-01-22: qty 50

## 2022-01-22 NOTE — ED Provider Notes (Signed)
[place] DEPT Provider Note   CSN: [id number] Arrival date & time: 01/22/22  1655     History Chief Complaint  Patient presents with   Abdominal Pain   Nausea   Tachycardia    Dylan Good is a 51 y.o. 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  He denies any melena or any hematochezia.  Denies any dysuria or hematuria.  He denies any chest pain or shortness of breath.  Denies any fevers.   Abdominal Pain Associated symptoms: nausea and vomiting   Associated symptoms: no chest pain, no constipation, no diarrhea, no dysuria, no fever, no hematuria and no shortness of breath        Home Medications Prior to Admission medications   Medication Sig Start Date End Date Taking? Authorizing Provider  acetaminophen (TYLENOL) 325 MG tablet Take 2 tablets (650 mg total) by mouth every 6 (six) hours as needed for mild pain (or Fever >/= 101). Patient not taking: Reported on 11/25/2021 10/07/21   Arrien, [name], MD  albuterol (PROVENTIL) (2.5 MG/3ML) 0.083% nebulizer solution Take 2.5 mg by nebulization every 6 (six) hours as needed for wheezing or shortness of breath.    [provider]  amLODipine (NORVASC) 5 MG tablet TAKE 1 TABLET (5 MG TOTAL) BY MOUTH DAILY. 07/17/21   [name], MD  dextromethorphan (DELSYM) 30 MG/5ML liquid Take 10 mLs (60 mg total) by mouth 2 (two) times daily as needed for cough (if hycodan not effective). Patient not taking: Reported on 11/25/2021 11/22/21   Barb Merino, MD  metoprolol succinate (TOPROL-XL) 25 MG 24 hr tablet Take 25 mg by mouth daily. 09/20/21   [provider]  ondansetron (ZOFRAN) 4 MG tablet Take 1 tablet (4 mg total) by mouth every 6 (six) hours as needed for nausea. Patient not taking: Reported  on 11/15/2021 09/16/21   [name], MD  pantoprazole (PROTONIX) 40 MG tablet Take 1 tablet (40 mg total) by mouth daily. Patient not taking: Reported on 11/25/2021 08/14/21   Eugenie Filler, MD  polyethylene glycol (MIRALAX / GLYCOLAX) 17 g packet Take 17 g by mouth daily. 11/22/21   Barb Merino, MD  SYMBICORT 80-4.5 MCG/ACT inhaler Inhale 2 puffs into the lungs daily as needed (for shortness of breath). 11/12/21   [provider]  VENTOLIN HFA 108 (90 Base) MCG/ACT inhaler Inhale 2 puffs into the lungs every 4 (four) hours as needed for shortness of breath or wheezing. 09/11/21   [provider]      Allergies    Patient has no known allergies.    Review of Systems   Review of Systems  Constitutional:  Negative for fever.  Respiratory:  Negative for shortness of breath.   Cardiovascular:  Negative for chest pain.  Gastrointestinal:  Positive for abdominal pain, nausea and vomiting. Negative for constipation and diarrhea.  Genitourinary:  Negative for dysuria, flank pain, hematuria, penile pain, penile swelling, scrotal swelling and testicular pain.  Musculoskeletal:  Negative for back pain.    Physical Exam Updated Vital Signs BP 126/80   Pulse (!) 110   Temp 98.7 F (37.1 C) (Oral)   Resp 13   SpO2 98%  Physical Exam Vitals and nursing note reviewed.  Constitutional:      General: He is not in acute distress.  Appearance: Normal appearance. He is not toxic-appearing.     Comments: Chronically ill-appearing  HENT:     Head: Normocephalic and atraumatic.     Mouth/Throat:     Mouth: Mucous membranes are dry.  Eyes:     General: No scleral icterus. Cardiovascular:     Rate and Rhythm: Regular rhythm. Tachycardia present.  Pulmonary:     Effort: Pulmonary effort is normal.     Breath sounds: Normal breath sounds.  Abdominal:     General: Abdomen is flat. Bowel sounds are normal. There is no distension.     Palpations: Abdomen is soft.      Tenderness: There is generalized abdominal tenderness and tenderness in the periumbilical area. There is no guarding or rebound.     Comments: Generalized abdominal pain but mainly periumbilical.  Abdomen is soft without any guarding or rebound.  No distention.  Normal active bowel sounds.  Musculoskeletal:        General: No deformity.     Cervical back: Normal range of motion.  Skin:    General: Skin is warm and dry.  Neurological:     General: No focal deficit present.     Mental Status: He is alert. Mental status is at baseline.     ED Results / Procedures / Treatments   Labs (all labs ordered are listed, but only abnormal results are displayed) Labs Reviewed  COMPREHENSIVE METABOLIC PANEL - Abnormal; Notable for the following components:      Result Value   Potassium 2.9 (*)    Glucose, Bld 134 (*)    Creatinine, Ser 0.60 (*)    Calcium 11.0 (*)    Albumin 3.3 (*)    AST 13 (*)    All other components within normal limits  CBC - Abnormal; Notable for the following components:   Hemoglobin 12.2 (*)    HCT 38.1 (*)    MCV 77.4 (*)    MCH 24.8 (*)    RDW 16.8 (*)    All other components within normal limits  URINALYSIS, ROUTINE W REFLEX MICROSCOPIC - Abnormal; Notable for the following components:   Hgb urine dipstick MODERATE (*)    Leukocytes,Ua TRACE (*)    RBC / HPF >50 (*)    All other components within normal limits  LACTIC ACID, PLASMA - Abnormal; Notable for the following components:   Lactic Acid, Venous 2.7 (*)    All other components within normal limits  LIPASE, BLOOD  LACTIC ACID, PLASMA    EKG None  Radiology CT ABDOMEN PELVIS W CONTRAST  Result Date: 01/22/2022 CLINICAL DATA:  Abdominal pain with nausea and vomiting. History of metastatic renal cancer. EXAM: CT ABDOMEN AND PELVIS WITH CONTRAST TECHNIQUE: Multidetector CT imaging of the abdomen and pelvis was performed using the standard protocol following bolus administration of intravenous  contrast. RADIATION DOSE REDUCTION: This exam was performed according to the departmental dose-optimization program which includes automated exposure control, adjustment of the mA and/or kV according to patient size and/or use of iterative reconstruction technique. CONTRAST:  113mL OMNIPAQUE IOHEXOL 300 MG/ML  SOLN COMPARISON:  CT chest, abdomen, and pelvis dated January 02, 2022. FINDINGS: Lower chest: Extensive pulmonary and pleural metastatic disease is not significantly changed. Decreased trace right and unchanged small left pleural effusions. Hepatobiliary: No focal liver abnormality is seen. No gallstones, gallbladder wall thickening, or biliary dilatation. Pancreas: Unremarkable. No pancreatic ductal dilatation or surrounding inflammatory changes. Spleen: Normal in size without focal abnormality. Adrenals/Urinary Tract: Large bilateral  adrenal metastases have increased in size. Large necrotic right renal mass is unchanged. 2.3 cm heterogeneously enhancing lesion in the left kidney is unchanged. The bladder is unremarkable for the degree of distention. Stomach/Bowel: Stomach is within normal limits. History of prior appendectomy. No evidence of bowel wall thickening, distention, or inflammatory changes. Vascular/Lymphatic: Aortic atherosclerosis. No tumor thrombus. Few mildly enlarged upper retroperitoneal lymph nodes are unchanged. Reproductive: Prostate is unremarkable. Other: No abdominal wall hernia or abnormality. No abdominopelvic ascites. No pneumoperitoneum. Musculoskeletal: L1 lytic lesion has mildly increased in size. 1.0 cm round lytic lesion in the left femoral neck has increased in size as well. Mild anasarca. IMPRESSION: 1. No acute intra-abdominal process. 2. Unchanged bilateral renal cell carcinomas with interval increase in size of bilateral adrenal metastases, as well as L1 vertebral body and left femoral neck osseous metastases. 3. Similar extensive intrathoracic metastatic disease. Decreased  trace right and unchanged small left pleural effusions. 4. Aortic Atherosclerosis (ICD10-I70.0). Electronically Signed   By: Titus Dubin M.D.   On: 01/22/2022 19:28    Procedures Procedures   Medications Ordered in ED Medications  lactated ringers bolus 1,000 mL (1,000 mLs Intravenous New Bag/Given 01/22/22 1837)  iohexol (OMNIPAQUE) 300 MG/ML solution 100 mL (100 mLs Intravenous Contrast Given 01/22/22 1852)  morphine (PF) 4 MG/ML injection 4 mg (4 mg Intravenous Given 01/22/22 1942)    ED Course/ Medical Decision Making/ A&P                           Medical Decision Making Amount and/or Complexity of Data Reviewed Labs: ordered. Radiology: ordered.  Risk Prescription drug management. Decision regarding hospitalization.   51 year old male presents the emergency department for evaluation of his abdominal pain.  Differential diagnosis includes was not limited to electrolyte abnormality, dehydration, mesenteric ischemia, colitis, diverticulitis, appendicitis, worsening malignancy.  Vital signs show significant tachycardia in the 130s otherwise normal.  Afebrile.  Physical exam as shown above.  Will order labs and imaging.  Gave patient 1 L of LR given his tachycardia.  Likely dehydration.  I independently reviewed and interpreted the patient's labs.  CMP shows decreased potassium at 2.9.  Magnesium low at 1.5.  Glucose elevated 134.  Creatinine 0.6 which appears to be his baseline.  Calcium elevated at 11 which is falsely low due to his albumin being only 3.3.  AST at 13 otherwise no LFT abnormalities.  Normal lipase.  Urinalysis shows moderate amount of hemoglobin with trace leukocytes but greater than 50 red blood cells.  Likely from his cancer.  CBC shows leukocytosis.  Patient's hemoglobin is 12.2.  Platelet count normal.  CT abdomen shows 1. No acute intra-abdominal process. 2. Unchanged bilateral renal cell carcinomas with interval increase in size of bilateral adrenal  metastases, as well as L1 vertebral body and left femoral neck osseous metastases. 3. Similar extensive intrathoracic metastatic disease. Decreased trace right and unchanged small left pleural effusions. 4. Aortic Atherosclerosis (ICD10-I70.0).  Patient's low electrolytes with his potassium and magnesium, I did order the patient potassium p.o. replacement as well as mag IVPB.  Also given morphine 4 mg for pain.  Unsure why patient is having abdominal pain, however given his cancer status and electrolyte abnormalities, will admit.  Heart rate has improved with fluids.  Now 105.  Patient seems to be in the upper 90s/low 100s at baseline.  Given the patient's hypercalcemia and being in a cancer patient.  I placed a page out to oncology and  spoke with Dr. Alvy Bimler who recommend 125 cc/h normal saline infusion and in to be seen by [name] in the morning.  She advised to hold on Zomelda until the patient is seen by oncology in the morning.  Will admit to medicine. Dr. Hal Hope to admit.    Final Clinical Impression(s) / ED Diagnoses Final diagnoses:  Hypercalcemia  Periumbilical abdominal pain  Tachycardia  Lactic acidosis    Rx / DC Orders ED Discharge Orders     None         Sherrell Puller, PA-C 01/22/22 2228    Fredia Sorrow, MD 01/25/22 [phone]

## 2022-01-22 NOTE — Telephone Encounter (Signed)
Spoke with patient and scheduled a Mychart Palliative Consult for 01/28/22 @ 1 PM.  Consent obtained; updated Netsmart, Team List and Epic.

## 2022-01-22 NOTE — Progress Notes (Signed)
Patient noted to use Trilogy BiPAP at home. Order placed for BiPAP QHS per RT protocol. RN aware.

## 2022-01-22 NOTE — ED Provider Triage Note (Signed)
Emergency Medicine Provider Triage Evaluation Note  Dylan Good , a 51 y.o. male  was evaluated in triage.  Pt complains of abd pain, N/V. No change in BM. Hx of pancreatitis due to CA meds per patient. No CP, SOB. Pain does not radiate to back  Review of Systems  Positive: Abd pain Negative:   Physical Exam  BP (!) 150/89   Pulse (!) 136   Temp 98.7 F (37.1 C) (Oral)   Resp 16   SpO2 100%  Gen:   Awake, no distress   Resp:  Normal effort  MSK:   Moves extremities without difficulty  Other:    Medical Decision Making  Medically screening exam initiated at 5:15 PM.  Appropriate orders placed.  Dylan Good was informed that the remainder of the evaluation will be completed by another provider, this initial triage assessment does not replace that evaluation, and the importance of remaining in the ED until their evaluation is complete.  Abd pain, tachy  Nursing aware patient needs room in back   Dylan Good A, PA-C 01/22/22 1716

## 2022-01-22 NOTE — ED Notes (Signed)
Pt given urinal.

## 2022-01-22 NOTE — ED Triage Notes (Signed)
Pt reports abd pain, N/V x 3 days. Hx pancreatitis.

## 2022-01-22 NOTE — H&P (Signed)
History and Physical    Dylan Good ZOX:[id number] DOB: 1971-06-11 DOA: 01/22/2022  PCP: Pcp, No  Patient coming from: Home.  Chief Complaint: Abdominal pain nausea vomiting.  HPI: Dylan Good is a 51 y.o. male with history of stage IV renal cell carcinoma was until recently on hospice patient got himself discharged off hospice was transferred to [facility] in month of May 2023 2 months ago for stent placement of his right main bronchus due to respiratory failure presents to the ER because of worsening abdominal pain with nausea vomiting poor oral intake over the last 1 week.  Pain is centered around the epigastric area and patient thought he may be having his recurrence of pancreatitis.  Denies any diarrhea.  ED Course: In the ER patient was tachycardic with elevated lactic acid levels hypercalcemia CT abdomen pelvis did not show anything acute.  ER physician discussed with on-call oncologist who advised hydration and counseling patient's oncologist [name].  Patient also was found to have low potassium and magnesium.  Review of Systems: As per HPI, rest all negative.   Past Medical History:  Diagnosis Date   Cancer, metastatic to lung [facility])    Hypertension 07/01/2021   Renal cell adenocarcinoma, left [facility])     Past Surgical History:  Procedure Laterality Date   APPENDECTOMY     BIOPSY  07/03/2021   Procedure: BIOPSY;  Surgeon: Ronnette Juniper, MD;  Location: [facility];  Service: Gastroenterology;;   CHEST TUBE INSERTION Left 02/06/2021   Procedure: INSERTION PLEURAL DRAINAGE CATHETER;  Surgeon: Garner Nash, DO;  Location: [place];  Service: Pulmonary;  Laterality: Left;   CHEST TUBE INSERTION N/A 04/04/2021   Procedure: removal of pleurex cath;  Surgeon: Garner Nash, DO;  Location: [place];  Service: Pulmonary;  Laterality: N/A;   ESOPHAGOGASTRODUODENOSCOPY (EGD) WITH PROPOFOL N/A 07/03/2021   Procedure: ESOPHAGOGASTRODUODENOSCOPY (EGD) WITH PROPOFOL;  Surgeon:  Ronnette Juniper, MD;  Location: [facility];  Service: Gastroenterology;  Laterality: N/A;     reports that he has been smoking cigarettes. He has been smoking an average of .2 packs per day. He has never used smokeless tobacco. He reports that he does not drink alcohol and does not use drugs.  No Known Allergies  Family History  Problem Relation Age of Onset   Lupus Mother    Cancer Other     Prior to Admission medications   Medication Sig Start Date End Date Taking? Authorizing Provider  albuterol (PROVENTIL) (2.5 MG/3ML) 0.083% nebulizer solution Take 2.5 mg by nebulization every 6 (six) hours as needed for wheezing or shortness of breath.   Yes [provider]  amLODipine (NORVASC) 5 MG tablet TAKE 1 TABLET (5 MG TOTAL) BY MOUTH DAILY. 07/17/21  Yes [name], MD  Oxycodone HCl 10 MG TABS Take 10 mg by mouth every 4 (four) hours as needed. 01/07/22  Yes [provider]  prochlorperazine (COMPAZINE) 5 MG tablet Take 5 mg by mouth in the morning, at noon, and at bedtime. 01/15/22  Yes [provider]  promethazine (PHENERGAN) 25 MG tablet Take 25 mg by mouth every 6 (six) hours as needed for nausea or vomiting. 12/27/21  Yes [provider]  SYMBICORT 80-4.5 MCG/ACT inhaler Inhale 2 puffs into the lungs daily as needed (for shortness of breath). 11/12/21  Yes [provider]  VENTOLIN HFA 108 (90 Base) MCG/ACT inhaler Inhale 2 puffs into the lungs every 4 (four) hours as needed for shortness of breath or  wheezing. 09/11/21  Yes [provider]  acetaminophen (TYLENOL) 325 MG tablet Take 2 tablets (650 mg total) by mouth every 6 (six) hours as needed for mild pain (or Fever >/= 101). Patient not taking: Reported on 11/25/2021 10/07/21   Arrien, [name], MD  dextromethorphan (DELSYM) 30 MG/5ML liquid Take 10 mLs (60 mg total) by mouth 2 (two) times daily as needed for cough (if hycodan not effective). Patient not taking: Reported on  11/25/2021 11/22/21   Barb Merino, MD  metoprolol succinate (TOPROL-XL) 25 MG 24 hr tablet Take 25 mg by mouth daily. Patient not taking: Reported on 01/22/2022 09/20/21   [provider]  ondansetron (ZOFRAN) 4 MG tablet Take 1 tablet (4 mg total) by mouth every 6 (six) hours as needed for nausea. Patient not taking: Reported on 11/15/2021 09/16/21   [name], MD  pantoprazole (PROTONIX) 40 MG tablet Take 1 tablet (40 mg total) by mouth daily. Patient not taking: Reported on 11/25/2021 08/14/21   Eugenie Filler, MD  polyethylene glycol (MIRALAX / GLYCOLAX) 17 g packet Take 17 g by mouth daily. Patient not taking: Reported on 01/22/2022 11/22/21   Barb Merino, MD  predniSONE (DELTASONE) 10 MG tablet Take by mouth. Patient not taking: Reported on 01/22/2022 01/15/22   [provider]    Physical Exam: Constitutional: Moderately built and nourished. Vitals:   01/22/22 2100 01/22/22 2130 01/22/22 2200 01/22/22 2229  BP: 102/90 116/73 123/73 133/80  Pulse: (!) 112 (!) 117 (!) 105 (!) 106  Resp: 14 (!) 22 (!) 21 17  Temp: 98.9 F (37.2 C)  99.4 F (37.4 C) 98.4 F (36.9 C)  TempSrc: Oral  Oral Oral  SpO2: 96% 95% 96% 100%   Eyes: Anicteric no pallor. ENMT: No discharge from the ears eyes nose and mouth. Neck:.  No neck rigidity. Respiratory: No rhonchi or crepitations. Cardiovascular: S1-S2 heard. Abdomen: Soft nontender bowel sound present. Musculoskeletal: No edema. Skin: No rash. Neurologic: Alert awake oriented to time place and person.  Moves all extremities. Psychiatric: Appears normal.  Normal affect.   Labs on Admission: I have personally reviewed following labs and imaging studies  CBC: Recent Labs  Lab 01/22/22 1709  WBC 5.9  HGB 12.2*  HCT 38.1*  MCV 77.4*  PLT 154   Basic Metabolic Panel: Recent Labs  Lab 01/22/22 1709 01/22/22 2015  NA 135  --   K 2.9*  --   CL 102  --   CO2 23  --   GLUCOSE 134*  --   BUN 8  --   CREATININE  0.60*  --   CALCIUM 11.0*  --   MG  --  1.5*   GFR: CrCl cannot be calculated (Unknown ideal weight.). Liver Function Tests: Recent Labs  Lab 01/22/22 1709  AST 13*  ALT 15  ALKPHOS 61  BILITOT 0.6  PROT 7.2  ALBUMIN 3.3*   Recent Labs  Lab 01/22/22 1709  LIPASE 24   No results for input(s): "AMMONIA" in the last 168 hours. Coagulation Profile: No results for input(s): "INR", "PROTIME" in the last 168 hours. Cardiac Enzymes: No results for input(s): "CKTOTAL", "CKMB", "CKMBINDEX", "TROPONINI" in the last 168 hours. BNP (last 3 results) No results for input(s): "PROBNP" in the last 8760 hours. HbA1C: No results for input(s): "HGBA1C" in the last 72 hours. CBG: No results for input(s): "GLUCAP" in the last 168 hours. Lipid Profile: No results for input(s): "CHOL", "HDL", "LDLCALC", "TRIG", "CHOLHDL", "LDLDIRECT" in the last  72 hours. Thyroid Function Tests: No results for input(s): "TSH", "T4TOTAL", "FREET4", "T3FREE", "THYROIDAB" in the last 72 hours. Anemia Panel: No results for input(s): "VITAMINB12", "FOLATE", "FERRITIN", "TIBC", "IRON", "RETICCTPCT" in the last 72 hours. Urine analysis:    Component Value Date/Time   COLORURINE YELLOW 01/22/2022 1709   APPEARANCEUR CLEAR 01/22/2022 1709   LABSPEC 1.013 01/22/2022 1709   PHURINE 6.0 01/22/2022 1709   GLUCOSEU NEGATIVE 01/22/2022 1709   HGBUR MODERATE (A) 01/22/2022 1709   BILIRUBINUR NEGATIVE 01/22/2022 1709   KETONESUR NEGATIVE 01/22/2022 1709   PROTEINUR NEGATIVE 01/22/2022 1709   NITRITE NEGATIVE 01/22/2022 1709   LEUKOCYTESUR TRACE (A) 01/22/2022 1709   Sepsis Labs: @LABRCNTIP (procalcitonin:4,lacticidven:4) )No results found for this or any previous visit (from the past 240 hour(s)).   Radiological Exams on Admission: CT ABDOMEN PELVIS W CONTRAST  Result Date: 01/22/2022 CLINICAL DATA:  Abdominal pain with nausea and vomiting. History of metastatic renal cancer. EXAM: CT ABDOMEN AND PELVIS WITH  CONTRAST TECHNIQUE: Multidetector CT imaging of the abdomen and pelvis was performed using the standard protocol following bolus administration of intravenous contrast. RADIATION DOSE REDUCTION: This exam was performed according to the departmental dose-optimization program which includes automated exposure control, adjustment of the mA and/or kV according to patient size and/or use of iterative reconstruction technique. CONTRAST:  156mL OMNIPAQUE IOHEXOL 300 MG/ML  SOLN COMPARISON:  CT chest, abdomen, and pelvis dated January 02, 2022. FINDINGS: Lower chest: Extensive pulmonary and pleural metastatic disease is not significantly changed. Decreased trace right and unchanged small left pleural effusions. Hepatobiliary: No focal liver abnormality is seen. No gallstones, gallbladder wall thickening, or biliary dilatation. Pancreas: Unremarkable. No pancreatic ductal dilatation or surrounding inflammatory changes. Spleen: Normal in size without focal abnormality. Adrenals/Urinary Tract: Large bilateral adrenal metastases have increased in size. Large necrotic right renal mass is unchanged. 2.3 cm heterogeneously enhancing lesion in the left kidney is unchanged. The bladder is unremarkable for the degree of distention. Stomach/Bowel: Stomach is within normal limits. History of prior appendectomy. No evidence of bowel wall thickening, distention, or inflammatory changes. Vascular/Lymphatic: Aortic atherosclerosis. No tumor thrombus. Few mildly enlarged upper retroperitoneal lymph nodes are unchanged. Reproductive: Prostate is unremarkable. Other: No abdominal wall hernia or abnormality. No abdominopelvic ascites. No pneumoperitoneum. Musculoskeletal: L1 lytic lesion has mildly increased in size. 1.0 cm round lytic lesion in the left femoral neck has increased in size as well. Mild anasarca. IMPRESSION: 1. No acute intra-abdominal process. 2. Unchanged bilateral renal cell carcinomas with interval increase in size of  bilateral adrenal metastases, as well as L1 vertebral body and left femoral neck osseous metastases. 3. Similar extensive intrathoracic metastatic disease. Decreased trace right and unchanged small left pleural effusions. 4. Aortic Atherosclerosis (ICD10-I70.0). Electronically Signed   By: Titus Dubin M.D.   On: 01/22/2022 19:28    EKG: Independently reviewed.  Sinus tachycardia.  Assessment/Plan Principal Problem:   Nausea & vomiting Active Problems:   Acute pancreatitis   Hypertension   Hypercalcemia of malignancy   Essential hypertension   Periumbilical abdominal pain    Abdominal pain with nausea vomiting cause not clear.  Will hydrate and advance diet as tolerated.  Pain relief medications. Hypokalemia hypomagnesemia and lactic acidosis likely from dehydration.  Replace recheck hydrate.  Follow metabolic panel and magnesium levels. Stage IV renal cell carcinoma with metastasis patient is planning to follow back with [name]. Anemia follow CBC. Hypertension continue amlodipine.  Patient states he was not taking metoprolol likely.  Be causing his tachycardia.  We  will restart a low-dose.   DVT prophylaxis: Lovenox. Code Status: Full code as confirmed with patient. Family Communication: Discussed with patient. Disposition Plan: Home when stable. Consults called: Palliative care. Admission status: Observation.   Rise Patience MD [facility] Pager [phone].  If 7PM-7AM, please contact night-coverage [email or web address] Password [facility]  01/22/2022, 11:50 PM

## 2022-01-22 NOTE — ED Notes (Signed)
Called lab to add on Mag level - lab advised they will run it.

## 2022-01-23 DIAGNOSIS — Z20822 Contact with and (suspected) exposure to covid-19: Secondary | ICD-10-CM | POA: Diagnosis present

## 2022-01-23 DIAGNOSIS — D638 Anemia in other chronic diseases classified elsewhere: Secondary | ICD-10-CM | POA: Diagnosis present

## 2022-01-23 DIAGNOSIS — C7971 Secondary malignant neoplasm of right adrenal gland: Secondary | ICD-10-CM | POA: Diagnosis present

## 2022-01-23 DIAGNOSIS — R1084 Generalized abdominal pain: Secondary | ICD-10-CM | POA: Diagnosis not present

## 2022-01-23 DIAGNOSIS — C642 Malignant neoplasm of left kidney, except renal pelvis: Secondary | ICD-10-CM | POA: Diagnosis present

## 2022-01-23 DIAGNOSIS — Z832 Family history of diseases of the blood and blood-forming organs and certain disorders involving the immune mechanism: Secondary | ICD-10-CM | POA: Diagnosis not present

## 2022-01-23 DIAGNOSIS — E872 Acidosis, unspecified: Secondary | ICD-10-CM | POA: Diagnosis present

## 2022-01-23 DIAGNOSIS — C7951 Secondary malignant neoplasm of bone: Secondary | ICD-10-CM | POA: Diagnosis present

## 2022-01-23 DIAGNOSIS — R112 Nausea with vomiting, unspecified: Secondary | ICD-10-CM | POA: Diagnosis present

## 2022-01-23 DIAGNOSIS — K859 Acute pancreatitis without necrosis or infection, unspecified: Secondary | ICD-10-CM | POA: Diagnosis not present

## 2022-01-23 DIAGNOSIS — E860 Dehydration: Secondary | ICD-10-CM | POA: Diagnosis present

## 2022-01-23 DIAGNOSIS — R1013 Epigastric pain: Secondary | ICD-10-CM | POA: Diagnosis present

## 2022-01-23 DIAGNOSIS — C7802 Secondary malignant neoplasm of left lung: Secondary | ICD-10-CM | POA: Diagnosis present

## 2022-01-23 DIAGNOSIS — C7801 Secondary malignant neoplasm of right lung: Secondary | ICD-10-CM | POA: Diagnosis present

## 2022-01-23 DIAGNOSIS — Z79899 Other long term (current) drug therapy: Secondary | ICD-10-CM | POA: Diagnosis not present

## 2022-01-23 DIAGNOSIS — C7972 Secondary malignant neoplasm of left adrenal gland: Secondary | ICD-10-CM | POA: Diagnosis present

## 2022-01-23 DIAGNOSIS — E876 Hypokalemia: Secondary | ICD-10-CM | POA: Diagnosis present

## 2022-01-23 DIAGNOSIS — Z9049 Acquired absence of other specified parts of digestive tract: Secondary | ICD-10-CM | POA: Diagnosis not present

## 2022-01-23 DIAGNOSIS — N400 Enlarged prostate without lower urinary tract symptoms: Secondary | ICD-10-CM | POA: Diagnosis present

## 2022-01-23 DIAGNOSIS — I700 Atherosclerosis of aorta: Secondary | ICD-10-CM | POA: Diagnosis present

## 2022-01-23 DIAGNOSIS — I10 Essential (primary) hypertension: Secondary | ICD-10-CM

## 2022-01-23 DIAGNOSIS — R109 Unspecified abdominal pain: Secondary | ICD-10-CM | POA: Diagnosis present

## 2022-01-23 DIAGNOSIS — Z7951 Long term (current) use of inhaled steroids: Secondary | ICD-10-CM | POA: Diagnosis not present

## 2022-01-23 DIAGNOSIS — F17210 Nicotine dependence, cigarettes, uncomplicated: Secondary | ICD-10-CM | POA: Diagnosis present

## 2022-01-23 LAB — COMPREHENSIVE METABOLIC PANEL
ALT: 11 U/L (ref 0–44)
AST: 9 U/L — ABNORMAL LOW (ref 15–41)
Albumin: 2.8 g/dL — ABNORMAL LOW (ref 3.5–5.0)
Alkaline Phosphatase: 51 U/L (ref 38–126)
Anion gap: 7 (ref 5–15)
BUN: 6 mg/dL (ref 6–20)
CO2: 21 mmol/L — ABNORMAL LOW (ref 22–32)
Calcium: 9.8 mg/dL (ref 8.9–10.3)
Chloride: 102 mmol/L (ref 98–111)
Creatinine, Ser: 0.35 mg/dL — ABNORMAL LOW (ref 0.61–1.24)
GFR, Estimated: >60 mL/min (ref >60)
Glucose, Bld: 85 mg/dL (ref 70–99)
Potassium: 4.1 mmol/L (ref 3.5–5.1)
Sodium: 130 mmol/L — ABNORMAL LOW (ref 135–145)
Total Bilirubin: 0.4 mg/dL (ref 0.3–1.2)
Total Protein: 6.1 g/dL — ABNORMAL LOW (ref 6.5–8.1)

## 2022-01-23 LAB — GLUCOSE, CAPILLARY
Glucose-Capillary: 109 mg/dL — ABNORMAL HIGH (ref 70–99)
Glucose-Capillary: 135 mg/dL — ABNORMAL HIGH (ref 70–99)
Glucose-Capillary: 96 mg/dL (ref 70–99)

## 2022-01-23 LAB — CBC WITH DIFFERENTIAL/PLATELET
Abs Immature Granulocytes: 0.08 10*3/uL — ABNORMAL HIGH (ref 0.00–0.07)
Basophils Absolute: 0 10*3/uL (ref 0.0–0.1)
Basophils Relative: 1 %
Eosinophils Absolute: 0.3 10*3/uL (ref 0.0–0.5)
Eosinophils Relative: 6 %
HCT: 32.8 % — ABNORMAL LOW (ref 39.0–52.0)
Hemoglobin: 10.3 g/dL — ABNORMAL LOW (ref 13.0–17.0)
Immature Granulocytes: 2 %
Lymphocytes Relative: 19 %
Lymphs Abs: 0.9 10*3/uL (ref 0.7–4.0)
MCH: 24.5 pg — ABNORMAL LOW (ref 26.0–34.0)
MCHC: 31.4 g/dL (ref 30.0–36.0)
MCV: 78.1 fL — ABNORMAL LOW (ref 80.0–100.0)
Monocytes Absolute: 0.8 10*3/uL (ref 0.1–1.0)
Monocytes Relative: 16 %
Neutro Abs: 2.7 10*3/uL (ref 1.7–7.7)
Neutrophils Relative %: 56 %
Platelets: 332 10*3/uL (ref 150–400)
RBC: 4.2 MIL/uL — ABNORMAL LOW (ref 4.22–5.81)
RDW: 17.2 % — ABNORMAL HIGH (ref 11.5–15.5)
WBC: 4.8 10*3/uL (ref 4.0–10.5)
nRBC: 0 % (ref 0.0–0.2)

## 2022-01-23 LAB — MAGNESIUM: Magnesium: 1.8 mg/dL (ref 1.7–2.4)

## 2022-01-23 MED ORDER — POTASSIUM CHLORIDE CRYS ER 20 MEQ PO TBCR
40.0000 meq | EXTENDED_RELEASE_TABLET | Freq: Once | ORAL | Status: AC
Start: 2022-01-23 — End: 2022-01-23
  Administered 2022-01-23: 40 meq via ORAL
  Filled 2022-01-23: qty 2

## 2022-01-23 MED ORDER — B COMPLEX-C PO TABS
1.0000 | ORAL_TABLET | Freq: Every day | ORAL | Status: DC
  Administered 2022-01-23 – 2022-01-24 (×2): 1 via ORAL
  Filled 2022-01-23 (×3): qty 1

## 2022-01-23 MED ORDER — ONDANSETRON HCL 4 MG/2ML IJ SOLN
4.0000 mg | Freq: Four times a day (QID) | INTRAMUSCULAR | Status: AC | PRN
  Administered 2022-01-23 – 2022-01-24 (×2): 4 mg via INTRAVENOUS
  Filled 2022-01-23 (×2): qty 2

## 2022-01-23 MED ORDER — ADULT MULTIVITAMIN W/MINERALS CH
1.0000 | ORAL_TABLET | Freq: Every day | ORAL | Status: DC
  Administered 2022-01-24 – 2022-01-25 (×2): 1 via ORAL
  Filled 2022-01-23 (×2): qty 1

## 2022-01-23 MED ORDER — METOPROLOL TARTRATE 25 MG PO TABS
12.5000 mg | ORAL_TABLET | Freq: Two times a day (BID) | ORAL | Status: DC
  Administered 2022-01-23 – 2022-01-24 (×5): 12.5 mg via ORAL
  Filled 2022-01-23 (×6): qty 1

## 2022-01-23 MED ORDER — ENSURE ENLIVE PO LIQD
237.0000 mL | Freq: Two times a day (BID) | ORAL | Status: DC
  Administered 2022-01-23 – 2022-01-25 (×4): 237 mL via ORAL

## 2022-01-23 NOTE — Progress Notes (Signed)
PROGRESS NOTE  Dylan Good:[id number] DOB: March 12, 1971 DOA: 01/22/2022 PCP: [name], No  Brief History    Dylan Good is a 51 y.o. male with history of stage IV renal cell carcinoma was until recently on hospice patient got himself discharged off hospice was transferred to [facility] in month of May 2023 2 months ago for stent placement of his right main bronchus due to respiratory failure presents to the ER because of worsening abdominal pain with nausea vomiting poor oral intake over the last 1 week.  Pain is centered around the epigastric area and patient thought he may be having his recurrence of pancreatitis.  Denies any diarrhea.   ED Course: In the ER patient was tachycardic with elevated lactic acid levels hypercalcemia CT abdomen pelvis did not show anything acute.  ER physician discussed with on-call oncologist who advised hydration and counseling patient's oncologist [name].  Patient also was found to have low potassium and magnesium.  The patient was admitted to a telemetry bed. His magnesium and potassium were supplemented and he was given IV fluids. He was restarted on a diet. He states that he is feeling better, although he is having right flank pain.  [name] was contacted about the patient. He stated that he would follow up with the patient as outpatient.  CT of the abdomen and pelvis was performed, but failed to reveal any cause for the patient's pain.   The patient has been started on a diet.  Consultants  None  Procedures  None  Antibiotics  None  Interval History/Subjective  The patient states that he is feeling better, but still have pain. He states that he does not have much of an appetite.  Objective   Vitals:  Vitals:   01/23/22 0939 01/23/22 1456  BP: 117/69 (!) 154/105  Pulse: 87 98  Resp:  18  Temp: 98.4 F (36.9 C) 99.7 F (37.6 C)  SpO2: 99% 99%    Exam:  Constitutional:  The patient is awake, alert, and oriented x 3. No acute  distress. Respiratory:  No increased work of breathing. No wheezes, rales, or rhonchi No tactile fremitus Cardiovascular:  Regular rate and rhythm No murmurs, ectopy, or gallups. No lateral PMI. No thrills. Abdomen:  Abdomen is soft, non-tender, non-distended The patient is complaining of right flank pain No hernias, masses, or organomegaly Normoactive bowel sounds.  Musculoskeletal:  No cyanosis, clubbing, or edema Skin:  No rashes, lesions, ulcers palpation of skin: no induration or nodules Neurologic:  CN 2-12 intact Sensation all 4 extremities intact Psychiatric:  Mental status Mood, affect appropriate Orientation to person, place, time  judgment and insight appear intact  I have personally reviewed the following:   Today's Data  Vitals  Lab Data  CMP CBC  Micro Data    Imaging  CT abdomen and pelvis  Cardiology Data  EKG  Other Data    Scheduled Meds:  amLODipine  5 mg Oral Daily   B-complex with vitamin C  1 tablet Oral Daily   enoxaparin (LOVENOX) injection  40 mg Subcutaneous Q24H   feeding supplement  237 mL Oral BID BM   metoprolol tartrate  12.5 mg Oral BID   mometasone-formoterol  2 puff Inhalation BID   [START ON 01/24/2022] multivitamin with minerals  1 tablet Oral Daily   Continuous Infusions:  sodium chloride 125 mL/hr at 01/23/22 1450    Principal Problem:   Nausea & vomiting Active Problems:   Acute pancreatitis   Hypertension  Hypercalcemia of malignancy   Essential hypertension   Periumbilical abdominal pain   Abdominal pain   LOS: 0 days   A & P  Abdominal pain with nausea/vomiting: Source of pain is unknown. CT abdomen and pelvis fails to demonstrate a source. Patient appears to be tolerating his diet. Will continue IV fluids.  Hypokalemia/hypomagnesemia: supplemented. 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 as  outpatient.  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 Monitor hemoglobin.  Hypertension: The patient is normotensive on amlodipine 5 mg daily.  I have seen and examined this patient myself. I have spent 34 minutes in his evaluation and care.  DVT prophylaxis: Lovenox Code Status: Full Family Communication: None available Disposition Plan: Home    Lauriann Milillo, DO [facility] Direct contact: see [email or web address]  7PM-7AM contact night coverage as above 01/23/2022, 6:39 PM  LOS: 0 days

## 2022-01-23 NOTE — Progress Notes (Signed)
Initial Nutrition Assessment  DOCUMENTATION CODES:   Non-severe (moderate) malnutrition in context of chronic illness  INTERVENTION:   -Ensure Plus High Protein po BID, each supplement provides 350 kcal and 20 grams of protein.   -Multivitamin with minerals daily  -B complex w/ Vitamin C  -Liberalize diet from Heart Healthy to Regular to maximize menu options and encourage PO intakes.   NUTRITION DIAGNOSIS:   Moderate Malnutrition related to chronic illness, cancer and cancer related treatments as evidenced by mild fat depletion, moderate muscle depletion, energy intake < or equal to 75% for > or equal to 1 month.  GOAL:   Patient will meet greater than or equal to 90% of their needs  MONITOR:   PO intake, Supplement acceptance, Labs, Weight trends, I & O's  REASON FOR ASSESSMENT:   Malnutrition Screening Tool    ASSESSMENT:   51 y.o. male with history of stage IV renal cell carcinoma was until recently on hospice patient got himself discharged off hospice was transferred to [facility] in month of May 2023 2 months ago for stent placement of his right main bronchus due to respiratory failure presents to the ER because of worsening abdominal pain with nausea vomiting poor oral intake over the last 1 week.  Patient in room, states he has not been eating well d/t fluctuating appetite and N/V.  Patient states he is not able to determine what causes his symptoms, not able to identify certain food triggers. Pt is agreeable to receiving Ensure supplements. States he tries to drink Boost but can only drink 1/2 bottle. Will add daily MVI and B complex given acute pancreatitis.   Pt unable to give UBW. Not sure if he has had any weight changes.  Current weight: 144 lbs. Per weight records, pt has lost 11 lbs since 2/25 (7% wt loss x 5 months, insignificant for time frame).   Medications: KLOR-CON  Labs reviewed:  CBGs: 96 Low Na  NUTRITION - FOCUSED PHYSICAL EXAM:  Flowsheet Row  Most Recent Value  Orbital Region Mild depletion  Upper Arm Region Moderate depletion  Thoracic and Lumbar Region Mild depletion  Buccal Region Mild depletion  Temple Region Mild depletion  Clavicle Bone Region Mild depletion  Clavicle and Acromion Bone Region Mild depletion  Scapular Bone Region Mild depletion  Dorsal Hand Mild depletion  Patellar Region Moderate depletion  Anterior Thigh Region Moderate depletion  Posterior Calf Region Moderate depletion  Edema (RD Assessment) None  Hair Reviewed  Eyes Reviewed  Mouth Reviewed  Skin Reviewed       Diet Order:   Diet Order             Diet regular Room service appropriate? Yes; Fluid consistency: Thin  Diet effective now                   EDUCATION NEEDS:   No education needs have been identified at this time  Skin:  Skin Assessment: Reviewed RN Assessment  Last BM:  7/17  Height:   Ht Readings from Last 1 Encounters:  11/25/21 5\' 9"  (1.753 m)    Weight:   Wt Readings from Last 1 Encounters:  01/23/22 65.6 kg    BMI:  Body mass index is 21.37 kg/m.  Estimated Nutritional Needs:   Kcal:  2000-2200  Protein:  95-110g  Fluid:  2L/day  Clayton Bibles, MS, RD, LDN Inpatient Clinical Dietitian Contact information available via Amion

## 2022-01-24 DIAGNOSIS — R1084 Generalized abdominal pain: Secondary | ICD-10-CM | POA: Diagnosis not present

## 2022-01-24 DIAGNOSIS — I10 Essential (primary) hypertension: Secondary | ICD-10-CM | POA: Diagnosis not present

## 2022-01-24 DIAGNOSIS — R112 Nausea with vomiting, unspecified: Secondary | ICD-10-CM | POA: Diagnosis not present

## 2022-01-24 LAB — GLUCOSE, CAPILLARY
Glucose-Capillary: 105 mg/dL — ABNORMAL HIGH (ref 70–99)
Glucose-Capillary: 109 mg/dL — ABNORMAL HIGH (ref 70–99)
Glucose-Capillary: 110 mg/dL — ABNORMAL HIGH (ref 70–99)

## 2022-01-24 LAB — BASIC METABOLIC PANEL
Anion gap: 4 — ABNORMAL LOW (ref 5–15)
BUN: <5 mg/dL — ABNORMAL LOW (ref 6–20)
CO2: 23 mmol/L (ref 22–32)
Calcium: 10.3 mg/dL (ref 8.9–10.3)
Chloride: 106 mmol/L (ref 98–111)
Creatinine, Ser: 0.42 mg/dL — ABNORMAL LOW (ref 0.61–1.24)
GFR, Estimated: >60 mL/min (ref >60)
Glucose, Bld: 103 mg/dL — ABNORMAL HIGH (ref 70–99)
Potassium: 4.2 mmol/L (ref 3.5–5.1)
Sodium: 133 mmol/L — ABNORMAL LOW (ref 135–145)

## 2022-01-24 MED ORDER — ONDANSETRON HCL 4 MG/2ML IJ SOLN
4.0000 mg | Freq: Four times a day (QID) | INTRAMUSCULAR | Status: DC | PRN
Start: 2022-01-24 — End: 2022-01-25
  Administered 2022-01-24 – 2022-01-25 (×4): 4 mg via INTRAVENOUS
  Filled 2022-01-24 (×5): qty 2

## 2022-01-24 NOTE — Progress Notes (Signed)
  Transition of Care [facility]) Screening Note   Patient Details  Name: ARMANII PRESSNELL Date of Birth: Sep 17, 1970   Transition of Care [facility]) CM/SW Contact:    Vassie Moselle, LCSW Phone Number: 01/24/2022, 9:24 AM    Transition of Care Department [facility]) has reviewed patient and no TOC needs have been identified at this time. We will continue to monitor patient advancement through interdisciplinary progression rounds. If new patient transition needs arise, please place a TOC consult.

## 2022-01-24 NOTE — Progress Notes (Signed)
Patient refused to wear bi-pap tonight.

## 2022-01-24 NOTE — Plan of Care (Signed)

## 2022-01-25 ENCOUNTER — Other Ambulatory Visit (HOSPITAL_COMMUNITY): Payer: Self-pay

## 2022-01-25 DIAGNOSIS — R1084 Generalized abdominal pain: Secondary | ICD-10-CM | POA: Diagnosis not present

## 2022-01-25 DIAGNOSIS — K859 Acute pancreatitis without necrosis or infection, unspecified: Secondary | ICD-10-CM

## 2022-01-25 DIAGNOSIS — R109 Unspecified abdominal pain: Secondary | ICD-10-CM

## 2022-01-25 DIAGNOSIS — R112 Nausea with vomiting, unspecified: Secondary | ICD-10-CM | POA: Diagnosis not present

## 2022-01-25 LAB — CBC WITH DIFFERENTIAL/PLATELET
Abs Immature Granulocytes: 0.04 10*3/uL (ref 0.00–0.07)
Basophils Absolute: 0 10*3/uL (ref 0.0–0.1)
Basophils Relative: 1 %
Eosinophils Absolute: 0.3 10*3/uL (ref 0.0–0.5)
Eosinophils Relative: 6 %
HCT: 36.2 % — ABNORMAL LOW (ref 39.0–52.0)
Hemoglobin: 11.2 g/dL — ABNORMAL LOW (ref 13.0–17.0)
Immature Granulocytes: 1 %
Lymphocytes Relative: 17 %
Lymphs Abs: 0.7 10*3/uL (ref 0.7–4.0)
MCH: 24.3 pg — ABNORMAL LOW (ref 26.0–34.0)
MCHC: 30.9 g/dL (ref 30.0–36.0)
MCV: 78.7 fL — ABNORMAL LOW (ref 80.0–100.0)
Monocytes Absolute: 0.7 10*3/uL (ref 0.1–1.0)
Monocytes Relative: 15 %
Neutro Abs: 2.7 10*3/uL (ref 1.7–7.7)
Neutrophils Relative %: 60 %
Platelets: 291 10*3/uL (ref 150–400)
RBC: 4.6 MIL/uL (ref 4.22–5.81)
RDW: 16.9 % — ABNORMAL HIGH (ref 11.5–15.5)
WBC: 4.4 10*3/uL (ref 4.0–10.5)
nRBC: 0 % (ref 0.0–0.2)

## 2022-01-25 LAB — COMPREHENSIVE METABOLIC PANEL
ALT: 10 U/L (ref 0–44)
AST: 12 U/L — ABNORMAL LOW (ref 15–41)
Albumin: 3.1 g/dL — ABNORMAL LOW (ref 3.5–5.0)
Alkaline Phosphatase: 57 U/L (ref 38–126)
Anion gap: 6 (ref 5–15)
BUN: 6 mg/dL (ref 6–20)
CO2: 24 mmol/L (ref 22–32)
Calcium: 11.3 mg/dL — ABNORMAL HIGH (ref 8.9–10.3)
Chloride: 103 mmol/L (ref 98–111)
Creatinine, Ser: 0.45 mg/dL — ABNORMAL LOW (ref 0.61–1.24)
GFR, Estimated: >60 mL/min (ref >60)
Glucose, Bld: 113 mg/dL — ABNORMAL HIGH (ref 70–99)
Potassium: 4.2 mmol/L (ref 3.5–5.1)
Sodium: 133 mmol/L — ABNORMAL LOW (ref 135–145)
Total Bilirubin: 0.5 mg/dL (ref 0.3–1.2)
Total Protein: 6.7 g/dL (ref 6.5–8.1)

## 2022-01-25 LAB — GLUCOSE, CAPILLARY
Glucose-Capillary: 109 mg/dL — ABNORMAL HIGH (ref 70–99)
Glucose-Capillary: 95 mg/dL (ref 70–99)

## 2022-01-25 MED ORDER — ENSURE ENLIVE PO LIQD
237.0000 mL | Freq: Two times a day (BID) | ORAL | 12 refills | Status: AC
  Filled 2022-01-25: qty 237, 1d supply, fill #0

## 2022-01-25 MED ORDER — B COMPLEX-C PO TABS
1.0000 | ORAL_TABLET | Freq: Every day | ORAL | 0 refills | Status: DC
  Filled 2022-01-25: qty 30, 30d supply, fill #0

## 2022-01-25 MED ORDER — LIDOCAINE 5 % EX PTCH
2.0000 | MEDICATED_PATCH | CUTANEOUS | Status: DC
  Filled 2022-01-25: qty 2

## 2022-01-25 MED ORDER — METOPROLOL TARTRATE 25 MG PO TABS
12.5000 mg | ORAL_TABLET | Freq: Two times a day (BID) | ORAL | 0 refills | Status: DC
  Filled 2022-01-25: qty 60, 60d supply, fill #0

## 2022-01-25 MED ORDER — ADULT MULTIVITAMIN W/MINERALS CH
1.0000 | ORAL_TABLET | Freq: Every day | ORAL | 0 refills | Status: DC
  Filled 2022-01-25: qty 30, 30d supply, fill #0

## 2022-01-25 MED ORDER — LIDOCAINE 5 % EX PTCH
2.0000 | MEDICATED_PATCH | CUTANEOUS | 0 refills | Status: AC
  Filled 2022-01-25: qty 30, 15d supply, fill #0

## 2022-01-25 NOTE — Plan of Care (Signed)
Instructions were reviewed with patient. All questions were answered. Patient was transported to main entrance by wheelchair. ° °

## 2022-01-25 NOTE — Progress Notes (Addendum)
PROGRESS NOTE  Dylan Good SHF:[id number] DOB: 10-15-1970 DOA: 01/22/2022 PCP: [name], No  Brief History    Dylan Good is a 51 y.o. male with history of stage IV renal cell carcinoma was until recently on hospice patient got himself discharged off hospice was transferred to [facility] in month of May 2023 2 months ago for stent placement of his right main bronchus due to respiratory failure presents to the ER because of worsening abdominal pain with nausea vomiting poor oral intake over the last 1 week.  Pain is centered around the epigastric area and patient thought he may be having his recurrence of pancreatitis.  Denies any diarrhea.   ED Course: In the ER patient was tachycardic with elevated lactic acid levels hypercalcemia CT abdomen pelvis did not show anything acute.  ER physician discussed with on-call oncologist who advised hydration and counseling patient's oncologist [name].  Patient also was found to have low potassium and magnesium.  The patient was admitted to a telemetry bed. His magnesium and potassium were supplemented and he was given IV fluids. He was restarted on a diet. He states that he is feeling better, although he is having right flank pain.  [name] was contacted about the patient. He stated that he would follow up with the patient as outpatient.  CT of the abdomen and pelvis was performed, but failed to reveal any cause for the patient's pain.   The patient has been started on a diet. He is tolerating it well, but states that he still does not have much of an appetite. He is complaining of worsening right flank pain.   Consultants  None  Procedures  None  Antibiotics  None  Interval History/Subjective  The patient is complaining of right flank pain. He is tolerating diet.   Objective   Vitals:  Vitals:   01/25/22 0808 01/25/22 1413  BP:  108/78  Pulse:  (!) 101  Resp: 14 16  Temp:  98.3 F (36.8 C)  SpO2: 96% 100%    Exam:  Constitutional:   The patient is awake, alert, and oriented x 3. No acute distress. Respiratory:  No increased work of breathing. No wheezes, rales, or rhonchi No tactile fremitus Cardiovascular:  Regular rate and rhythm No murmurs, ectopy, or gallups. No lateral PMI. No thrills. Abdomen:  Abdomen is soft, non-tender, non-distended The patient is complaining of right flank pain No hernias, masses, or organomegaly Normoactive bowel sounds.  Musculoskeletal:  No cyanosis, clubbing, or edema Skin:  No rashes, lesions, ulcers palpation of skin: no induration or nodules Neurologic:  CN 2-12 intact Sensation all 4 extremities intact Psychiatric:  Mental status Mood, affect appropriate Orientation to person, place, time  judgment and insight appear intact  I have personally reviewed the following:   Today's Data  Vitals  Lab Data  CMP CBC  Micro Data    Imaging  CT abdomen and pelvis  Cardiology Data  EKG  Other Data    Scheduled Meds:  amLODipine  5 mg Oral Daily   B-complex with vitamin C  1 tablet Oral Daily   enoxaparin (LOVENOX) injection  40 mg Subcutaneous Q24H   feeding supplement  237 mL Oral BID BM   lidocaine  2 patch Transdermal Q24H   metoprolol tartrate  12.5 mg Oral BID   mometasone-formoterol  2 puff Inhalation BID   multivitamin with minerals  1 tablet Oral Daily   Continuous Infusions:    Principal Problem:   Nausea &  vomiting Active Problems:   Acute pancreatitis   Hypertension   Hypercalcemia of malignancy   Essential hypertension   Periumbilical abdominal pain   Abdominal pain   LOS: 2 days   A & P  Abdominal pain with nausea/vomiting: Source of pain is unknown. CT abdomen and pelvis fails to demonstrate a source. Patient appears to be tolerating his diet. He is complaining primarily of right flank pain today. He will be started on lidocaine patch.  Hypokalemia/hypomagnesemia: Resolved. 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 as outpatient.  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 Monitor hemoglobin.  Hypertension: The patient is normotensive on amlodipine 5 mg daily.  I have seen and examined this patient myself. I have spent 34 minutes in his evaluation and care.  DVT prophylaxis: Lovenox Code Status: Full Family Communication: None available Disposition Plan: Home    Sibley Rolison, DO [facility] Direct contact: see [email or web address]  7PM-7AM contact night coverage as above 01/24/2022, 3:24 PM  LOS: 0 days

## 2022-01-25 NOTE — Discharge Summary (Addendum)
Physician Discharge Summary   Patient: Dylan Good MRN: [id number] DOB: 06-24-1971  Admit date:     01/22/2022  Discharge date: 01/25/22  Discharge Physician: Karie Kirks   PCP: Pcp, No   Recommendations at discharge:  {  Discharge to home Follow up with [name] in 1-2 weeks. Follow up with PCP in 7-10 days.  The patient should have CBC and BMP with magnesium drawn on that visit to be reported to PCP.  Discharge Diagnoses: Principal Problem:   Nausea & vomiting Active Problems:   Acute pancreatitis   Hypertension   Hypercalcemia of malignancy   Essential hypertension   Periumbilical abdominal pain   Abdominal pain Dehydration on admission - resolved.  Hospital Course:  ALLENMICHAEL MCPARTLIN is a 51 y.o. male with history of stage IV renal cell carcinoma was until recently on hospice patient got himself discharged off hospice was transferred to [facility] in month of May 2023 2 months ago for stent placement of his right main bronchus due to respiratory failure presents to the ER because of worsening abdominal pain with nausea vomiting poor oral intake over the last 1 week.  Pain is centered around the epigastric area and patient thought he may be having his recurrence of pancreatitis.  Denies any diarrhea.   ED Course: In the ER patient was tachycardic with elevated lactic acid levels hypercalcemia CT abdomen pelvis did not show anything acute.  ER physician discussed with on-call oncologist who advised hydration and counseling patient's oncologist [name].  Patient also was found to have low potassium and magnesium.   The patient was admitted to a telemetry bed. His magnesium and potassium were supplemented and he was given IV fluids. He was restarted on a diet. He states that he is feeling better, although he is having right flank pain.   [name] was contacted about the patient. He stated that he would follow up with the patient as outpatient.   CT of the abdomen and pelvis was  performed, but failed to reveal any cause for the patient's pain.    The patient has been started on a diet. He is tolerating it well, but states that he still does not have much of an appetite. He continues to complain of right flank pain. He is eating.   The patient will be discharged to home with lidocaine patches.  Assessment and Plan: Abdominal pain with nausea/vomiting: Source of pain is unknown. CT abdomen and pelvis fails to demonstrate a source. Patient appears to be tolerating his diet. He is complaining primarily of right flank pain today. He will be started on lidocaine patch.   Hypokalemia/hypomagnesemia: Resolved. 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 as outpatient.   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 Monitor hemoglobin.   Hypertension: The patient is normotensive on amlodipine 5 mg daily.  I have seen and examined this patient myself. I have spent 38 minutes in his evaluation and discharge.  Consultants: None Procedures performed: None  Disposition: Home Diet recommendation:  Discharge Diet Orders (From admission, onward)     Start     Ordered   01/25/22 0000  Diet - low sodium heart healthy        01/25/22 1527           Regular diet DISCHARGE MEDICATION: Allergies as of 01/25/2022   No Known Allergies      Medication List  STOP taking these medications    dextromethorphan 30 MG/5ML liquid Commonly known as: DELSYM   metoprolol succinate 25 MG 24 hr tablet Commonly known as: TOPROL-XL   pantoprazole 40 MG tablet Commonly known as: Protonix   polyethylene glycol 17 g packet Commonly known as: MIRALAX / GLYCOLAX   predniSONE 10 MG tablet Commonly known as: DELTASONE   promethazine 25 MG tablet Commonly known as: PHENERGAN       TAKE these medications    acetaminophen 325 MG tablet Commonly known as: TYLENOL Take 2 tablets (650 mg total) by  mouth every 6 (six) hours as needed for mild pain (or Fever >/= 101).   amLODipine 5 MG tablet Commonly known as: NORVASC TAKE 1 TABLET (5 MG TOTAL) BY MOUTH DAILY.   B-complex with vitamin C tablet Take 1 tablet by mouth daily.   feeding supplement Liqd Take 237 mLs by mouth 2 (two) times daily between meals. Start taking on: January 26, 2022   lidocaine 5 % Commonly known as: LIDODERM Place 2 patches onto the skin daily. Remove & Discard patch within 12 hours or as directed by MD Start taking on: January 26, 2022   metoprolol tartrate 25 MG tablet Commonly known as: LOPRESSOR Take 0.5 tablets (12.5 mg total) by mouth 2 (two) times daily.   multivitamin with minerals Tabs tablet Take 1 tablet by mouth daily. Start taking on: January 26, 2022   ondansetron 4 MG tablet Commonly known as: ZOFRAN Take 1 tablet (4 mg total) by mouth every 6 (six) hours as needed for nausea.   Oxycodone HCl 10 MG Tabs Take 10 mg by mouth every 4 (four) hours as needed.   prochlorperazine 5 MG tablet Commonly known as: COMPAZINE Take 5 mg by mouth in the morning, at noon, and at bedtime.   Symbicort 80-4.5 MCG/ACT inhaler Generic drug: budesonide-formoterol Inhale 2 puffs into the lungs daily as needed (for shortness of breath).   albuterol (2.5 MG/3ML) 0.083% nebulizer solution Commonly known as: PROVENTIL Take 2.5 mg by nebulization every 6 (six) hours as needed for wheezing or shortness of breath.   Ventolin HFA 108 (90 Base) MCG/ACT inhaler Generic drug: albuterol Inhale 2 puffs into the lungs every 4 (four) hours as needed for shortness of breath or wheezing.        Discharge Exam: Filed Weights   01/24/22 0009 01/24/22 0452 01/25/22 0705  Weight: 68 kg 68 kg 68.1 kg   Vitals:   01/25/22 0808 01/25/22 1413  BP:  108/78  Pulse:  (!) 101  Resp: 14 16  Temp:  98.3 F (36.8 C)  SpO2: 96% 100%   Exam:  Constitutional:  The patient is awake, alert, and oriented x 3. No acute  distress. Respiratory:  No increased work of breathing. No wheezes, rales, or rhonchi No tactile fremitus Cardiovascular:  Regular rate and rhythm No murmurs, ectopy, or gallups. No lateral PMI. No thrills. Abdomen:  Abdomen is soft, non-tender, non-distended No hernias, masses, or organomegaly Normoactive bowel sounds.  Musculoskeletal:  No cyanosis, clubbing, or edema Skin:  No rashes, lesions, ulcers palpation of skin: no induration or nodules Neurologic:  CN 2-12 intact Sensation all 4 extremities intact Psychiatric:  Mental status Mood, affect appropriate Orientation to person, place, time  judgment and insight appear intact   Condition at discharge: fair  The results of significant diagnostics from this hospitalization (including imaging, microbiology, ancillary and laboratory) are listed below for reference.   Imaging Studies: CT ABDOMEN PELVIS W CONTRAST  Result Date: 01/22/2022 CLINICAL DATA:  Abdominal pain with nausea and vomiting. History of metastatic renal cancer. EXAM: CT ABDOMEN AND PELVIS WITH CONTRAST TECHNIQUE: Multidetector CT imaging of the abdomen and pelvis was performed using the standard protocol following bolus administration of intravenous contrast. RADIATION DOSE REDUCTION: This exam was performed according to the departmental dose-optimization program which includes automated exposure control, adjustment of the mA and/or kV according to patient size and/or use of iterative reconstruction technique. CONTRAST:  155mL OMNIPAQUE IOHEXOL 300 MG/ML  SOLN COMPARISON:  CT chest, abdomen, and pelvis dated January 02, 2022. FINDINGS: Lower chest: Extensive pulmonary and pleural metastatic disease is not significantly changed. Decreased trace right and unchanged small left pleural effusions. Hepatobiliary: No focal liver abnormality is seen. No gallstones, gallbladder wall thickening, or biliary dilatation. Pancreas: Unremarkable. No pancreatic ductal dilatation or  surrounding inflammatory changes. Spleen: Normal in size without focal abnormality. Adrenals/Urinary Tract: Large bilateral adrenal metastases have increased in size. Large necrotic right renal mass is unchanged. 2.3 cm heterogeneously enhancing lesion in the left kidney is unchanged. The bladder is unremarkable for the degree of distention. Stomach/Bowel: Stomach is within normal limits. History of prior appendectomy. No evidence of bowel wall thickening, distention, or inflammatory changes. Vascular/Lymphatic: Aortic atherosclerosis. No tumor thrombus. Few mildly enlarged upper retroperitoneal lymph nodes are unchanged. Reproductive: Prostate is unremarkable. Other: No abdominal wall hernia or abnormality. No abdominopelvic ascites. No pneumoperitoneum. Musculoskeletal: L1 lytic lesion has mildly increased in size. 1.0 cm round lytic lesion in the left femoral neck has increased in size as well. Mild anasarca. IMPRESSION: 1. No acute intra-abdominal process. 2. Unchanged bilateral renal cell carcinomas with interval increase in size of bilateral adrenal metastases, as well as L1 vertebral body and left femoral neck osseous metastases. 3. Similar extensive intrathoracic metastatic disease. Decreased trace right and unchanged small left pleural effusions. 4. Aortic Atherosclerosis (ICD10-I70.0). Electronically Signed   By: Titus Dubin M.D.   On: 01/22/2022 19:28   CT CHEST ABDOMEN PELVIS W CONTRAST  Result Date: 01/02/2022 CLINICAL DATA:  Kidney cancer; * Tracking Code: BO * EXAM: CT CHEST, ABDOMEN, AND PELVIS WITH CONTRAST TECHNIQUE: Multidetector CT imaging of the chest, abdomen and pelvis was performed following the standard protocol during bolus administration of intravenous contrast. RADIATION DOSE REDUCTION: This exam was performed according to the departmental dose-optimization program which includes automated exposure control, adjustment of the mA and/or kV according to patient size and/or use of  iterative reconstruction technique. CONTRAST:  158mL OMNIPAQUE IOHEXOL 300 MG/ML  SOLN COMPARISON:  Chest CT dated Nov 24, 2021; CT abdomen and pelvis dated Dec 04, 2021 FINDINGS: CT CHEST FINDINGS Cardiovascular: Normal heart size. No pericardial effusion. No evidence of coronary artery calcifications. Mild atherosclerotic disease of the thoracic aorta. Mediastinum/Nodes: Esophagus and thyroid are unremarkable. Extensive mediastinal and right hilar adenopathy is slightly decreased in size when compared with most recent prior CT. Reference conglomerate of prevascular nodes measures 2.9 x 1.5 cm on series 2, image 21, previously 3.3 x 1.7 cm. Lungs/Pleura: Right mainstem bronchus is patent with interval stent placement. Persistent soft tissue nodule is seen in the distal right mainstem bronchus measuring 7 mm. New linear ground-glass opacities of the right upper lobe. Innumerable bilateral solid pulmonary nodules slightly increased in size when compared to prior exam. Reference nodule of the right lower lobe measures 2.0 x 1.6 cm on series 2, image 43, previously 1.7 x 1.3 cm. Reference left lower lobe pulmonary nodule measures 2.3 x 2.3 cm on image 87, previously  2.1 x 2.0 cm. Small bilateral pleural effusions. Conglomerate of pleural tissue measuring 5.7 by 2.9 cm on series 2, image 47 is increased in size when compared with prior exam, previously 4.4 x 1.9 cm. Additional smaller numerous left-sided pleural nodules which are unchanged in size when compared to prior exam. Reference pleural nodule measuring 1.7 cm on series 2, image 51, unchanged. Musculoskeletal: Left 5th rib destructive osseous lesion with large soft tissue component measuring 7.3 x 4.9 cm on series 2, image 29, decreased in size when compared with prior exam, previously measured 8.6 x 5.1 cm. CT ABDOMEN PELVIS FINDINGS Hepatobiliary: No focal liver abnormality is seen. No gallstones, gallbladder wall thickening, or biliary dilatation. Pancreas:  Unremarkable. No pancreatic ductal dilatation or surrounding inflammatory changes. Spleen: Normal in size without focal abnormality. Adrenals/Urinary Tract: Bilateral adrenal masses are increased in size when compared with September 24, 2021 abdominal CT. As reference, left adrenal gland mass measures 7.0 x 2.9 cm on series 2, image 61, previously 5.7 x 2.0 cm. Large heterogeneous right renal mass measures 13 x 10 cm, previously 12 x 9.8 cm. Smaller heterogeneous mass of the left kidney measuring 2.3 x 2.0 cm, previously 1.8 x 1.5 cm. Unchanged mild right hydronephrosis Stomach/Bowel: Stomach is within normal limits. No evidence of bowel wall thickening, distention, or inflammatory changes. Vascular/Lymphatic: Aortic atherosclerosis. Enlarged left periaortic lymph node measuring 1.3 cm in short axis, previously 0.9 cm. Reproductive: Mild prostatomegaly. Other: Trace pelvic ascites. Musculoskeletal: Lytic lesion of the L1 vertebral body, increased in size. IMPRESSION: 1. New paramediastinal linear consolidation of the right upper lobe, likely post radiation change. 2. Slight decreased size of mediastinal and right hilar lymphadenopathy. Patent right mainstem bronchus stent. 3. Interval decreased size of left chest wall soft tissue mass. 4. Slightly increased size of bilateral pulmonary nodules. Extensive left pleural lesions are increased or stable in size when compared to prior exam. 5. Small left-greater-than-right pleural effusions. 6. Compared with October 04, 2021 CT of the abdomen and pelvis, large right renal mass, left renal mass, bilateral adrenal gland masses, L1 vertebral body lytic lesion and a left para-aortic lymph node are increased in size. Electronically Signed   By: Yetta Glassman M.D.   On: 01/02/2022 20:30    Microbiology: Results for orders placed or performed during the hospital encounter of 11/24/21  MRSA Next Gen by PCR, Nasal     Status: None   Collection Time: 11/24/21  9:23 PM   Specimen:  Nasal Mucosa; Nasal Swab  Result Value Ref Range Status   MRSA by PCR Next Gen NOT DETECTED NOT DETECTED Final    Comment: (NOTE) The GeneXpert MRSA Assay (FDA approved for NASAL specimens only), is one component of a comprehensive MRSA colonization surveillance program. It is not intended to diagnose MRSA infection nor to guide or monitor treatment for MRSA infections. Test performance is not FDA approved in patients less than 33 years old. Performed at [facility], [place] [address], [place], [place] [address]   Resp Panel by RT-PCR (Flu A&B, Covid) Nasopharyngeal Swab     Status: None   Collection Time: 11/26/21  1:54 PM   Specimen: Nasopharyngeal Swab; Nasopharyngeal(NP) swabs in vial transport medium  Result Value Ref Range Status   SARS Coronavirus 2 by RT PCR NEGATIVE NEGATIVE Final    Comment: (NOTE) SARS-CoV-2 target nucleic acids are NOT DETECTED.  The SARS-CoV-2 RNA is generally detectable in upper respiratory specimens during the acute phase of infection. The lowest concentration of  SARS-CoV-2 viral copies this assay can detect is 138 copies/mL. A negative result does not preclude SARS-Cov-2 infection and should not be used as the sole basis for treatment or other patient management decisions. A negative result may occur with  improper specimen collection/handling, submission of specimen other than nasopharyngeal swab, presence of viral mutation(s) within the areas targeted by this assay, and inadequate number of viral copies(<138 copies/mL). A negative result must be combined with clinical observations, patient history, and epidemiological information. The expected result is Negative.  Fact Sheet for Patients:  EntrepreneurPulse.com.au  Fact Sheet for Healthcare Providers:  IncredibleEmployment.be  This test is no t yet approved or cleared by the [place] FDA and  has been authorized for detection and/or  diagnosis of SARS-CoV-2 by FDA under an Emergency Use Authorization (EUA). This EUA will remain  in effect (meaning this test can be used) for the duration of the COVID-19 declaration under Section 564(b)(1) of the Act, 21 U.S.C.section 360bbb-3(b)(1), unless the authorization is terminated  or revoked sooner.       Influenza A by PCR NEGATIVE NEGATIVE Final   Influenza B by PCR NEGATIVE NEGATIVE Final    Comment: (NOTE) The Xpert Xpress SARS-CoV-2/FLU/RSV plus assay is intended as an aid in the diagnosis of influenza from Nasopharyngeal swab specimens and should not be used as a sole basis for treatment. Nasal washings and aspirates are unacceptable for Xpert Xpress SARS-CoV-2/FLU/RSV testing.  Fact Sheet for Patients: EntrepreneurPulse.com.au  Fact Sheet for Healthcare Providers: IncredibleEmployment.be  This test is not yet approved or cleared by the [place] FDA and has been authorized for detection and/or diagnosis of SARS-CoV-2 by FDA under an Emergency Use Authorization (EUA). This EUA will remain in effect (meaning this test can be used) for the duration of the COVID-19 declaration under Section 564(b)(1) of the Act, 21 U.S.C. section 360bbb-3(b)(1), unless the authorization is terminated or revoked.  Performed at [facility], [place] [address] [place], [place] [address]     Labs: CBC: Recent Labs  Lab 01/22/22 1709 01/23/22 0641 01/25/22 1012  WBC 5.9 4.8 4.4  NEUTROABS  --  2.7 2.7  HGB 12.2* 10.3* 11.2*  HCT 38.1* 32.8* 36.2*  MCV 77.4* 78.1* 78.7*  PLT 337 332 401   Basic Metabolic Panel: Recent Labs  Lab 01/22/22 1709 01/22/22 2015 01/23/22 0641 01/24/22 0918 01/25/22 1012  NA 135  --  130* 133* 133*  K 2.9*  --  4.1 4.2 4.2  CL 102  --  102 106 103  CO2 23  --  21* 23 24  GLUCOSE 134*  --  85 103* 113*  BUN 8  --  6 <5* 6  CREATININE 0.60*  --  0.35* 0.42* 0.45*  CALCIUM 11.0*  --   9.8 10.3 11.3*  MG  --  1.5* 1.8  --   --    Liver Function Tests: Recent Labs  Lab 01/22/22 1709 01/23/22 0641 01/25/22 1012  AST 13* 9* 12*  ALT 15 11 10   ALKPHOS 61 51 57  BILITOT 0.6 0.4 0.5  PROT 7.2 6.1* 6.7  ALBUMIN 3.3* 2.8* 3.1*   CBG: Recent Labs  Lab 01/24/22 0013 01/24/22 0832 01/24/22 1600 01/25/22 0007 01/25/22 0739  GLUCAP 105* 110* 109* 109* 95    Discharge time spent: greater than 30 minutes.  Signed: Genice Kimberlin, DO [facility] 01/25/2022

## 2022-01-28 ENCOUNTER — Telehealth: Payer: Medicaid Other | Admitting: Internal Medicine

## 2022-01-28 DIAGNOSIS — R1084 Generalized abdominal pain: Secondary | ICD-10-CM

## 2022-01-28 DIAGNOSIS — R112 Nausea with vomiting, unspecified: Secondary | ICD-10-CM

## 2022-01-28 DIAGNOSIS — R058 Other specified cough: Secondary | ICD-10-CM

## 2022-01-28 NOTE — Progress Notes (Signed)
[facility] Consult Note Telephone: [phone]  Fax: [phone]   Date of encounter: 01/28/22 10:27 AM PATIENT NAME: Dylan Good [address] [address]   [phone] (home)  DOB: 12-Feb-1971 MRN: [id number] PRIMARY CARE PROVIDER:    Dorise Hiss, PA-C,  [place] [address] [phone]  REFERRING PROVIDER:   [name], MD [place],  [place] [address] [phone]  RESPONSIBLE PARTY:    Contact Information     Name Relation Home Work Mobile   Dylan Good Significant other   [phone]   Dylan Good, Dylan Good   [phone]        Due to the COVID-19 crisis, this visit was done via telemedicine from my office and it was initiated and consent by this patient and or family.  I connected with  [name] on 01/28/22 by a video enabled telemedicine application and verified that I am speaking with the correct person using two identifiers.   I discussed the limitations of evaluation and management by telemedicine. The patient expressed understanding and agreed to proceed.  Palliative Care was asked to follow this patient by consultation request of  Shadad, [name], MD to address advance care planning and complex medical decision making. This is the initial visit.                                     ASSESSMENT AND PLAN / RECOMMENDATIONS:   Advance Care Planning/Goals of Care: Goals include to maximize quality of life and symptom management. Patient/health care surrogate gave his/her permission to discuss.Our advance care planning conversation included a discussion about:    The value and importance of advance care planning  Experiences with loved ones who have been seriously ill or have died  Exploration of personal, cultural or spiritual beliefs that might influence medical decisions  Exploration of goals of care in the event of  a sudden injury or illness  Identification  of a healthcare agent--he has not yet done these things.   He says his family knows his wishes. Review and updating or creation of an advance directive document . Decision not to resuscitate or to de-escalate disease focused treatments due to poor prognosis. CODE STATUS:  FULL CODE.   He still wants to receive more treatment.  He wants his symptoms managed and not to be in pain.   He has a large family with kids and grandkids.    Symptom Management/Plan:  Pain in abdomen:    It's in his side.  It's not clear of the cause, but his girlfriend says they told him it was from coughing.  There is a mass on his opposite side now from where he had the radiation.  Lidoderm patch is helping.  Has oxycodone 10mg  every 4 hrs as needed and is using it--he does have a laxative and stool softener to keep him going.  Sleeps through the night.  Not using tylenol.  Nausea:  He's been taking zofran daily.  His girlfriend gives him one a day.  Discussed use up to 4 times a day.      Dry cough:  much improved after the surgery and he can't walk without being dizzy or faint.    Palliative care consult:  continue to follow and consider MOST completion in future--elects to pursue further cancer treatments at  this time.  Follow up Palliative Care Visit: Palliative care will continue to follow for complex medical decision making, advance care planning, and clarification of goals. Return 2022/04/13   This visit was coded based on medical decision making (MDM).    PPS: 50%  HOSPICE ELIGIBILITY/DIAGNOSIS: Renal cell ca with mets / eligible, but elects further treatments  Chief Complaint: Initial mychart palliative consult  HISTORY OF PRESENT ILLNESS:  Dylan Good is a 51 y.o. 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  He was recently hospitalized from 7/18-21 with nausea and vomiting and found to have acute pancreatitis and hypercalcemia of malignancy.    Review on oncologic history shows he was diagnosed in June of 2022 via thoracentesis.  He had a pleurx catheter placed in 8/22, had stereotactic radiosurgery to the brain July 2022 with 20 gray in 1 fraction.  He had immunotherapy with ipilimumab and nivolumab x 4 cycles starting July 2022.  He then had nivolumab alone for 4 wks, but it was stopped due to progression of his disease.  In March of this year, he was on cabometyx that had been started 11/22, but was interrupted from 12/22-3/23.  He developed pancreatitis so it was stopped end of march.  Treatments were placed on hold.  He was hospitalized again in May with worsening cough, sob, and chest pain.  He was found to have worsening metastatic disease and hypercalcemia of malignancy.  He was discharged reportedly with palliative care.  Unclear which organization as none of our notes are in the system.  He was treated with palliative radiation to his left rib mass and intrathoracic adenopathy due to nagging cough and pain.  He had some f/u visits at [facility] with Dr. Erin Good and Dr. Penne Good and consideration of tumor debulking was made due to near occlusion of his right mainstem bronchus.  He has had postobstructive pneumonia and required bipap for respiratory failure.  Hospice had been recommended, but he opted to pursue aggressive care--5/25, he had a bronchoscopy at [facility] with tumor excision.  Most recently, he was admitted to [facility] long earlier this month with nausea, vomiting, pancreatitis and hypercalcemia of malignancy.  He's been home since 7/21.    He's able to breathe better.  He's been off and on nauseous since he's been home from the hospital 7/21.  Has good and bad days on account of  eating.  Nausea just comes out of the blue.  Does have appetite.  No constipation.    History obtained from review of EMR, discussion with primary team, and interview with family, facility staff/caregiver and/or Dylan Good.   I reviewed available labs, medications, imaging, studies and related documents from the EMR.  Records reviewed and summarized above.   ROS  Review of Systems see hpi  Physical Exam:  Wt Readings from Last 500 Encounters:  01/25/22 150 lb 2.1 oz (68.1 kg)  11/25/21 148 lb (67.1 kg)  11/15/21 149 lb 4.7 oz (67.7 kg)  11/02/21 149 lb 4.8 oz (67.7 kg)  10/05/21 153 lb 10.6 oz (69.7 kg)  09/28/21 153 lb 3.2 oz (69.5 kg)  09/12/21 150 lb (68 kg)  09/01/21 155 lb 10.3 oz (70.6 kg)  08/17/21 163 lb (73.9 kg)  08/12/21 159 lb 9.8 oz (72.4  kg)  07/20/21 164 lb 8 oz (74.6 kg)  07/19/21 164 lb 11.2 oz (74.7 kg)  07/03/21 173 lb 15.1 oz (78.9 kg)  06/27/21 174 lb (78.9 kg)  06/19/21 174 lb 13.2 oz (79.3 kg)  06/05/21 173 lb 12.8 oz (78.8 kg)  05/16/21 180 lb 3.2 oz (81.7 kg)  05/08/21 176 lb 12.8 oz (80.2 kg)  04/10/21 183 lb 9 oz (83.3 kg)  04/04/21 185 lb 6.5 oz (84.1 kg)  03/28/21 185 lb 6.4 oz (84.1 kg)  03/19/21 184 lb 12.8 oz (83.8 kg)  03/06/21 184 lb (83.5 kg)  03/02/21 185 lb 8 oz (84.1 kg)  02/26/21 184 lb 1.6 oz (83.5 kg)  02/22/21 185 lb 6.4 oz (84.1 kg)  02/05/21 186 lb (84.4 kg)  01/31/21 188 lb 12.8 oz (85.6 kg)  01/29/21 190 lb 6 oz (86.4 kg)  01/25/21 194 lb (88 kg)  01/23/21 194 lb 3.2 oz (88.1 kg)  01/15/21 199 lb 8 oz (90.5 kg)  12/29/20 203 lb 12.8 oz (92.4 kg)  12/01/20 205 lb 0.4 oz (93 kg)  09/29/17 205 lb (93 kg)  04/03/15 215 lb (97.5 kg)   Physical Exam Constitutional:      Appearance: He is ill-appearing.  Pulmonary:     Effort: Pulmonary effort is normal.  Abdominal:     General: There is no distension.  Musculoskeletal:        General: Normal range of motion.  Neurological:     Mental Status: He is oriented to person,  place, and time.     CURRENT PROBLEM LIST:  Patient Active Problem List   Diagnosis Date Noted   Abdominal pain 01/23/2022   Nausea & vomiting 76/54/6503   Periumbilical abdominal pain    Acute respiratory failure with hypoxia ([place]) 11/24/2021   Serum total bilirubin elevated 11/24/2021   Malnutrition of moderate degree 11/17/2021   Metastatic renal cell carcinoma to lung ([place]) 11/16/2021   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[place]) 11/16/2021   Goals of care, counseling/discussion    Dyspnea 11/15/2021   GERD (gastroesophageal reflux disease) 11/15/2021   Hyponatremia 10/05/2021   Essential hypertension 10/05/2021   Right nephrolithiasis 10/04/2021   Hypercalcemia of malignancy 09/12/2021   Sinus tachycardia 09/12/2021   Neutropenia ([place]) 08/31/2021   Recent COVID-19 virus infection, possible bacterial pneumonia 08/29/2021   Malignant neoplasm metastatic to lung ([place]) 08/13/2021   Intractable pain 07/01/2021   Hypertension 07/01/2021   Tobacco use 07/01/2021   Acute pancreatitis 07/01/2021   Pleural effusion on left 01/31/2021   Brain metastases 01/24/2021   Hypercalcemia 01/15/2021   Kidney cancer, primary, with metastasis from kidney to other site [facility]) 12/29/2020   PAST MEDICAL HISTORY:  Active Ambulatory Problems    Diagnosis Date Noted   Kidney cancer, primary, with metastasis from kidney to other site [facility]) 12/29/2020   Hypercalcemia 01/15/2021   Brain metastases 01/24/2021   Pleural effusion on left 01/31/2021   Intractable pain 07/01/2021   Hypertension 07/01/2021   Tobacco use 07/01/2021   Acute pancreatitis 07/01/2021   Malignant neoplasm metastatic to lung ([place]) 08/13/2021   Recent COVID-19 virus infection, possible bacterial pneumonia 08/29/2021   Neutropenia ([place]) 08/31/2021   Hypercalcemia of malignancy 09/12/2021   Sinus tachycardia 09/12/2021   Right nephrolithiasis 10/04/2021   Hyponatremia 10/05/2021    Essential hypertension 10/05/2021   Dyspnea 11/15/2021   GERD (gastroesophageal reflux disease) 11/15/2021   Goals of care, counseling/discussion    Metastatic renal cell carcinoma to lung (  [place]) 11/16/2021   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[place]) 11/16/2021   Malnutrition of moderate degree 11/17/2021   Acute respiratory failure with hypoxia ([facility]) 11/24/2021   Serum total bilirubin elevated 11/24/2021   Nausea & vomiting 02/58/5277   Periumbilical abdominal pain    Abdominal pain 01/23/2022   Resolved Ambulatory Problems    Diagnosis Date Noted   Chest tube in place    Pancreatitis 06/19/2021   Hypomagnesemia 08/13/2021   Chest pain, non-cardiac 11/16/2021   Past Medical History:  Diagnosis Date   Cancer, metastatic to lung [facility])    Renal cell adenocarcinoma, left ([facility])    SOCIAL HX:  Social History   Tobacco Use   Smoking status: Some Days    Packs/day: 0.20    Types: Cigarettes   Smokeless tobacco: Never  Substance Use Topics   Alcohol use: Never   FAMILY HX:  Family History  Problem Relation Age of Onset   Lupus Mother    Cancer Other       ALLERGIES: No Known Allergies    PERTINENT MEDICATIONS:  Outpatient Encounter Medications as of 01/28/2022  Medication Sig   acetaminophen (TYLENOL) 325 MG tablet Take 2 tablets (650 mg total) by mouth every 6 (six) hours as needed for mild pain (or Fever >/= 101). (Patient not taking: Reported on 11/25/2021)   albuterol (PROVENTIL) (2.5 MG/3ML) 0.083% nebulizer solution Take 2.5 mg by nebulization every 6 (six) hours as needed for wheezing or shortness of breath.   amLODipine (NORVASC) 5 MG tablet TAKE 1 TABLET (5 MG TOTAL) BY MOUTH DAILY.   B Complex-C (B-COMPLEX WITH VITAMIN C) tablet Take 1 tablet by mouth daily.   feeding supplement (ENSURE ENLIVE / ENSURE PLUS) LIQD Take 237 mLs by mouth 2 (two) times daily between meals.   lidocaine (LIDODERM) 5 % Place 2 patches onto the skin  daily. Remove & Discard patch within 12 hours or as directed by MD   metoprolol tartrate (LOPRESSOR) 25 MG tablet Take 1/2 tablet (12.5 mg total) by mouth 2 (two) times daily.   Multiple Vitamin (MULTIVITAMIN WITH MINERALS) TABS tablet Take 1 tablet by mouth daily.   ondansetron (ZOFRAN) 4 MG tablet Take 1 tablet (4 mg total) by mouth every 6 (six) hours as needed for nausea. (Patient not taking: Reported on 11/15/2021)   Oxycodone HCl 10 MG TABS Take 10 mg by mouth every 4 (four) hours as needed.   prochlorperazine (COMPAZINE) 5 MG tablet Take 5 mg by mouth in the morning, at noon, and at bedtime.   SYMBICORT 80-4.5 MCG/ACT inhaler Inhale 2 puffs into the lungs daily as needed (for shortness of breath).   VENTOLIN HFA 108 (90 Base) MCG/ACT inhaler Inhale 2 puffs into the lungs every 4 (four) hours as needed for shortness of breath or wheezing.   No facility-administered encounter medications on file as of 01/28/2022.    Thank you for the opportunity to participate in the care of Mr. Chesnut.  The palliative care team will continue to follow. Please call our office at [phone] if we can be of additional assistance.   Hollace Kinnier, DO  COVID-19 PATIENT SCREENING TOOL Asked and negative response unless otherwise noted:  Have you had symptoms of covid, tested positive or been in contact with someone with symptoms/positive test in the past 5-10 days?  NO

## 2022-02-02 ENCOUNTER — Encounter (HOSPITAL_COMMUNITY): Payer: Self-pay

## 2022-02-02 ENCOUNTER — Other Ambulatory Visit: Payer: Self-pay

## 2022-02-02 ENCOUNTER — Emergency Department (HOSPITAL_COMMUNITY)
Admission: EM | Admit: 2022-02-02 | Discharge: 2022-02-02 | Disposition: A | Discharge status: Home / Self Care | Payer: Medicaid Other | Attending: Emergency Medicine | Admitting: Emergency Medicine

## 2022-02-02 DIAGNOSIS — R109 Unspecified abdominal pain: Secondary | ICD-10-CM | POA: Diagnosis present

## 2022-02-02 DIAGNOSIS — R112 Nausea with vomiting, unspecified: Secondary | ICD-10-CM

## 2022-02-02 LAB — URINALYSIS, ROUTINE W REFLEX MICROSCOPIC
Bacteria, UA: NONE SEEN
Bilirubin Urine: NEGATIVE
Glucose, UA: NEGATIVE mg/dL
Ketones, ur: NEGATIVE mg/dL
Nitrite: NEGATIVE
Protein, ur: 30 mg/dL — AB
RBC / HPF: >50 RBC/hpf — ABNORMAL HIGH (ref 0–5)
Specific Gravity, Urine: 1.013 (ref 1.005–1.030)
pH: 7 (ref 5.0–8.0)

## 2022-02-02 LAB — COMPREHENSIVE METABOLIC PANEL
ALT: 9 U/L (ref 0–44)
AST: 13 U/L — ABNORMAL LOW (ref 15–41)
Albumin: 3.5 g/dL (ref 3.5–5.0)
Alkaline Phosphatase: 59 U/L (ref 38–126)
Anion gap: 7 (ref 5–15)
BUN: 10 mg/dL (ref 6–20)
CO2: 21 mmol/L — ABNORMAL LOW (ref 22–32)
Calcium: 12.3 mg/dL — ABNORMAL HIGH (ref 8.9–10.3)
Chloride: 108 mmol/L (ref 98–111)
Creatinine, Ser: 0.7 mg/dL (ref 0.61–1.24)
GFR, Estimated: >60 mL/min (ref >60)
Glucose, Bld: 95 mg/dL (ref 70–99)
Potassium: 3.9 mmol/L (ref 3.5–5.1)
Sodium: 136 mmol/L (ref 135–145)
Total Bilirubin: 0.4 mg/dL (ref 0.3–1.2)
Total Protein: 7.4 g/dL (ref 6.5–8.1)

## 2022-02-02 LAB — LIPASE, BLOOD: Lipase: 30 U/L (ref 11–51)

## 2022-02-02 LAB — CBC
HCT: 34.1 % — ABNORMAL LOW (ref 39.0–52.0)
Hemoglobin: 10.9 g/dL — ABNORMAL LOW (ref 13.0–17.0)
MCH: 24.4 pg — ABNORMAL LOW (ref 26.0–34.0)
MCHC: 32 g/dL (ref 30.0–36.0)
MCV: 76.5 fL — ABNORMAL LOW (ref 80.0–100.0)
Platelets: 312 10*3/uL (ref 150–400)
RBC: 4.46 MIL/uL (ref 4.22–5.81)
RDW: 16.3 % — ABNORMAL HIGH (ref 11.5–15.5)
WBC: 4.8 10*3/uL (ref 4.0–10.5)
nRBC: 0 % (ref 0.0–0.2)

## 2022-02-02 MED ORDER — PROMETHAZINE HCL 25 MG RE SUPP: 25.0000 mg | Freq: Three times a day (TID) | RECTAL | 0 refills | Status: AC | PRN

## 2022-02-02 MED ORDER — SODIUM CHLORIDE 0.9 % IV SOLN
12.5000 mg | Freq: Three times a day (TID) | INTRAVENOUS | Status: DC | PRN
  Administered 2022-02-02: 12.5 mg via INTRAVENOUS
  Filled 2022-02-02: qty 0.5

## 2022-02-02 MED ORDER — SODIUM CHLORIDE 0.9 % IV BOLUS
1000.0000 mL | Freq: Once | INTRAVENOUS | Status: AC
Start: 2022-02-02 — End: 2022-02-02
  Administered 2022-02-02: 1000 mL via INTRAVENOUS

## 2022-02-02 MED ORDER — PROMETHAZINE HCL 25 MG PO TABS: 25.0000 mg | ORAL_TABLET | Freq: Three times a day (TID) | ORAL | 0 refills | Status: AC | PRN

## 2022-02-02 MED ORDER — HYDROMORPHONE HCL 1 MG/ML IJ SOLN
1.0000 mg | Freq: Once | INTRAMUSCULAR | Status: AC
  Administered 2022-02-02: 1 mg via INTRAVENOUS
  Filled 2022-02-02: qty 1

## 2022-02-02 NOTE — ED Provider Triage Note (Signed)
Emergency Medicine Provider Triage Evaluation Note  Dylan Good , a 51 y.o. male  was evaluated in triage.  Pt complains of abdominal pain and nausea predominantly.  Started 2 days ago.  Nausea is the worst.  Does not recall nausea meds at home.  Pain is more under control.  Hx of metastatic cancer, was intermittently on hospice Also admitted earlier this month for abdominal pain, nausea, hypercalcemia  Review of Systems  Positive: Nausea, abdominal pain Negative: Diarrhea, constipation  Physical Exam  BP 126/80 (BP Location: Right Arm)   Pulse 99   Temp 98.2 F (36.8 C) (Oral)   Resp 20   SpO2 100%  Gen:   Awake, no distress   Resp:  Normal effort  MSK:   Moves extremities without difficulty    Medical Decision Making  Medically screening exam initiated at 8:12 AM.  Appropriate orders placed.  Dylan Good was informed that the remainder of the evaluation will be completed by another provider, this initial triage assessment does not replace that evaluation, and the importance of remaining in the ED until their evaluation is complete.  Nausea/abdominal pain labs   Dylan Dusky, MD 02/02/22 [phone]

## 2022-02-02 NOTE — ED Triage Notes (Signed)
Pt reports right sided abdominal pain and N.V x3 days. Denies diarrhea. Hx pancreatitis.

## 2022-02-02 NOTE — ED Provider Notes (Signed)
[facility] DEPT Provider Note   CSN: [id number] Arrival date & time: 02/02/22  0753     History  Chief Complaint  Patient presents with   Abdominal Pain    Dylan Good is a 51 y.o. male presenting emergency department with abdominal pain.  Onset 2 days ago.  Nausea is the bigger problem than the pain.  He is on pain medications at home.  Patient was discharged from the hospital approximately 1 week ago on July 21 after being admitted for nausea vomiting and abdominal pain, found to have acute pancreatitis, hypercalcemia of malignancy, hypertension, symptoms improved in the hospital with IV fluids.  CT imaging at that time showed that he had large bilateral adrenal metastases and a large necrotic right renal mass, pancreas was unremarkable on imaging.  He had a few mildly enlarged upper retroperitoneal lymph nodes.  Also had an L1 lytic lesion and a similar lesion in the neck of the left femur.  HPI     Home Medications Prior to Admission medications   Medication Sig Start Date End Date Taking? Authorizing Provider  promethazine (PHENERGAN) 25 MG suppository Place 1 suppository (25 mg total) rectally every 8 (eight) hours as needed for nausea or vomiting. 02/02/22  Yes Loring Liskey, [name], MD  promethazine (PHENERGAN) 25 MG tablet Take 1 tablet (25 mg total) by mouth every 8 (eight) hours as needed for up to 12 doses for nausea or vomiting. 02/02/22  Yes Allante Whitmire, [name], MD  acetaminophen (TYLENOL) 325 MG tablet Take 2 tablets (650 mg total) by mouth every 6 (six) hours as needed for mild pain (or Fever >/= 101). Patient not taking: Reported on 11/25/2021 10/07/21   Arrien, [name], MD  albuterol (PROVENTIL) (2.5 MG/3ML) 0.083% nebulizer solution Take 2.5 mg by nebulization every 6 (six) hours as needed for wheezing or shortness of breath.    [provider]  amLODipine (NORVASC) 5 MG tablet TAKE 1 TABLET (5 MG TOTAL) BY MOUTH DAILY. 07/17/21    [name], MD  B Complex-C (B-COMPLEX WITH VITAMIN C) tablet Take 1 tablet by mouth daily. 01/25/22   Swayze, Ava, DO  feeding supplement (ENSURE ENLIVE / ENSURE PLUS) LIQD Take 237 mLs by mouth 2 (two) times daily between meals. 01/26/22   Swayze, Ava, DO  lidocaine (LIDODERM) 5 % Place 2 patches onto the skin daily. Remove & Discard patch within 12 hours or as directed by MD 01/26/22   Swayze, Ava, DO  metoprolol tartrate (LOPRESSOR) 25 MG tablet Take 1/2 tablet (12.5 mg total) by mouth 2 (two) times daily. 01/25/22   Swayze, Ava, DO  Multiple Vitamin (MULTIVITAMIN WITH MINERALS) TABS tablet Take 1 tablet by mouth daily. 01/26/22   Swayze, Ava, DO  ondansetron (ZOFRAN) 4 MG tablet Take 1 tablet (4 mg total) by mouth every 6 (six) hours as needed for nausea. Patient not taking: Reported on 11/15/2021 09/16/21   [name], MD  Oxycodone HCl 10 MG TABS Take 10 mg by mouth every 4 (four) hours as needed. 01/07/22   [provider]  prochlorperazine (COMPAZINE) 5 MG tablet Take 5 mg by mouth in the morning, at noon, and at bedtime. 01/15/22   [provider]  SYMBICORT 80-4.5 MCG/ACT inhaler Inhale 2 puffs into the lungs daily as needed (for shortness of breath). 11/12/21   [provider]  VENTOLIN HFA 108 (90 Base) MCG/ACT inhaler Inhale 2 puffs into the lungs every 4 (four) hours as needed for  shortness of breath or wheezing. 09/11/21   [provider]      Allergies    Patient has no known allergies.    Review of Systems   Review of Systems  Physical Exam Updated Vital Signs BP 108/68   Pulse 90   Temp (!) 97.5 F (36.4 C) (Axillary)   Resp 16   SpO2 98%  Physical Exam Constitutional:      General: He is not in acute distress. HENT:     Head: Normocephalic and atraumatic.  Eyes:     Conjunctiva/sclera: Conjunctivae normal.     Pupils: Pupils are equal, round, and reactive to light.  Cardiovascular:     Rate and Rhythm: Normal rate and regular  rhythm.  Pulmonary:     Effort: Pulmonary effort is normal. No respiratory distress.  Abdominal:     General: There is no distension.     Tenderness: There is abdominal tenderness in the epigastric area.  Skin:    General: Skin is warm and dry.  Neurological:     General: No focal deficit present.     Mental Status: He is alert. Mental status is at baseline.  Psychiatric:        Mood and Affect: Mood normal.        Behavior: Behavior normal.     ED Results / Procedures / Treatments   Labs (all labs ordered are listed, but only abnormal results are displayed) Labs Reviewed  COMPREHENSIVE METABOLIC PANEL - Abnormal; Notable for the following components:      Result Value   CO2 21 (*)    Calcium 12.3 (*)    AST 13 (*)    All other components within normal limits  CBC - Abnormal; Notable for the following components:   Hemoglobin 10.9 (*)    HCT 34.1 (*)    MCV 76.5 (*)    MCH 24.4 (*)    RDW 16.3 (*)    All other components within normal limits  URINALYSIS, ROUTINE W REFLEX MICROSCOPIC - Abnormal; Notable for the following components:   APPearance CLOUDY (*)    Hgb urine dipstick SMALL (*)    Protein, ur 30 (*)    Leukocytes,Ua TRACE (*)    RBC / HPF >50 (*)    All other components within normal limits  LIPASE, BLOOD    EKG None  Radiology No results found.  Procedures Procedures    Medications Ordered in ED Medications  promethazine (PHENERGAN) 12.5 mg in sodium chloride 0.9 % 50 mL IVPB (0 mg Intravenous Stopped 02/02/22 1127)  sodium chloride 0.9 % bolus 1,000 mL (0 mLs Intravenous Stopped 02/02/22 1100)  HYDROmorphone (DILAUDID) injection 1 mg (1 mg Intravenous Given 02/02/22 1150)    ED Course/ Medical Decision Making/ A&P Clinical Course as of 02/02/22 1629  Sat Feb 02, 2022  1247 Patient is feeling better and tolerating p.o. fluids.  Will discharge with Phenergan.  He has pain medications at home. [MT]    Clinical Course User Index [MT] Maddalynn Barnard,  [name], MD                           Medical Decision Making Amount and/or Complexity of Data Reviewed Labs: ordered.  Risk Prescription drug management.   This patient presents to the ED with concern for abdominal pain, nausea. This involves an extensive number of treatment options, and is a complaint that carries with it a high risk of complications  and morbidity.  The differential diagnosis includes bowel obstruction versus ileus versus pancreatitis versus pain from malignancy versus other  Co-morbidities that complicate the patient evaluation: History of adrenal masses, metastatic cancer,   External records from outside source obtained and reviewed including hospital course and discharge summary from 1 week ago, CT abdomen pelvis from July 18.  I ordered and personally interpreted labs.  The pertinent results include:  labs near baseline levels, Ca elevated likely 2/2 malignancy  I did not feel he needed emergent repeat CT imaging at this time.  CT from 7/18 reviewed.  Doubt SBO or surgical emergency clinically.   The patient was maintained on a cardiac monitor.  I personally viewed and interpreted the cardiac monitored which showed an underlying rhythm of: NSR  I ordered medication including IV fluids for hypercalcemia, IV Phenergan for nausea and hydration.  After the interventions noted above, I reevaluated the patient and found that they have: improved  Dispostion:  After consideration of the diagnostic results and the patients response to treatment, I feel that the patent would benefit from close outpatient f/u.  Patient has metastatic cancer, multiple possible causes of pain and nausea.  Will prescribe phenergan in two forms if he cannot tolerate oral pills, but he is stable now, taking PO in the ED, for discharge.         Final Clinical Impression(s) / ED Diagnoses Final diagnoses:  Nausea and vomiting, unspecified vomiting type    Rx / DC Orders ED  Discharge Orders          Ordered    promethazine (PHENERGAN) 25 MG suppository  Every 8 hours PRN        02/02/22 1248    promethazine (PHENERGAN) 25 MG tablet  Every 8 hours PRN        02/02/22 1248              Wyvonnia Dusky, MD 02/02/22 1631

## 2022-02-05 ENCOUNTER — Encounter (HOSPITAL_COMMUNITY): Payer: Self-pay

## 2022-02-05 ENCOUNTER — Emergency Department (HOSPITAL_COMMUNITY): Payer: Medicaid Other

## 2022-02-05 ENCOUNTER — Telehealth: Payer: Self-pay | Admitting: Oncology

## 2022-02-05 ENCOUNTER — Emergency Department (HOSPITAL_COMMUNITY)
Admission: EM | Admit: 2022-02-05 | Discharge: 2022-02-05 | Disposition: A | Discharge status: Home / Self Care | Payer: Medicaid Other | Attending: Emergency Medicine | Admitting: Emergency Medicine

## 2022-02-05 DIAGNOSIS — C641 Malignant neoplasm of right kidney, except renal pelvis: Secondary | ICD-10-CM | POA: Insufficient documentation

## 2022-02-05 DIAGNOSIS — C799 Secondary malignant neoplasm of unspecified site: Secondary | ICD-10-CM

## 2022-02-05 DIAGNOSIS — R109 Unspecified abdominal pain: Secondary | ICD-10-CM | POA: Diagnosis present

## 2022-02-05 DIAGNOSIS — E876 Hypokalemia: Secondary | ICD-10-CM | POA: Diagnosis not present

## 2022-02-05 LAB — CBC WITH DIFFERENTIAL/PLATELET
Abs Immature Granulocytes: 0.01 10*3/uL (ref 0.00–0.07)
Basophils Absolute: 0 10*3/uL (ref 0.0–0.1)
Basophils Relative: 1 %
Eosinophils Absolute: 0.1 10*3/uL (ref 0.0–0.5)
Eosinophils Relative: 3 %
HCT: 34.6 % — ABNORMAL LOW (ref 39.0–52.0)
Hemoglobin: 10.9 g/dL — ABNORMAL LOW (ref 13.0–17.0)
Immature Granulocytes: 0 %
Lymphocytes Relative: 18 %
Lymphs Abs: 0.6 10*3/uL — ABNORMAL LOW (ref 0.7–4.0)
MCH: 24.3 pg — ABNORMAL LOW (ref 26.0–34.0)
MCHC: 31.5 g/dL (ref 30.0–36.0)
MCV: 77.1 fL — ABNORMAL LOW (ref 80.0–100.0)
Monocytes Absolute: 0.4 10*3/uL (ref 0.1–1.0)
Monocytes Relative: 10 %
Neutro Abs: 2.4 10*3/uL (ref 1.7–7.7)
Neutrophils Relative %: 68 %
Platelets: 311 10*3/uL (ref 150–400)
RBC: 4.49 MIL/uL (ref 4.22–5.81)
RDW: 16.4 % — ABNORMAL HIGH (ref 11.5–15.5)
WBC: 3.5 10*3/uL — ABNORMAL LOW (ref 4.0–10.5)
nRBC: 0 % (ref 0.0–0.2)

## 2022-02-05 LAB — COMPREHENSIVE METABOLIC PANEL
ALT: 8 U/L (ref 0–44)
AST: 13 U/L — ABNORMAL LOW (ref 15–41)
Albumin: 3.4 g/dL — ABNORMAL LOW (ref 3.5–5.0)
Alkaline Phosphatase: 58 U/L (ref 38–126)
Anion gap: 7 (ref 5–15)
BUN: 8 mg/dL (ref 6–20)
CO2: 23 mmol/L (ref 22–32)
Calcium: 11.6 mg/dL — ABNORMAL HIGH (ref 8.9–10.3)
Chloride: 103 mmol/L (ref 98–111)
Creatinine, Ser: 0.67 mg/dL (ref 0.61–1.24)
GFR, Estimated: >60 mL/min (ref >60)
Glucose, Bld: 81 mg/dL (ref 70–99)
Potassium: 3.6 mmol/L (ref 3.5–5.1)
Sodium: 133 mmol/L — ABNORMAL LOW (ref 135–145)
Total Bilirubin: 0.6 mg/dL (ref 0.3–1.2)
Total Protein: 7.4 g/dL (ref 6.5–8.1)

## 2022-02-05 LAB — URINALYSIS, ROUTINE W REFLEX MICROSCOPIC
Bilirubin Urine: NEGATIVE
Glucose, UA: NEGATIVE mg/dL
Ketones, ur: 5 mg/dL — AB
Nitrite: NEGATIVE
Protein, ur: 30 mg/dL — AB
Specific Gravity, Urine: 1.018 (ref 1.005–1.030)
pH: 5 (ref 5.0–8.0)

## 2022-02-05 LAB — LIPASE, BLOOD: Lipase: 23 U/L (ref 11–51)

## 2022-02-05 MED ORDER — SODIUM CHLORIDE 0.9 % IV SOLN: INTRAVENOUS | Status: DC

## 2022-02-05 MED ORDER — IOHEXOL 300 MG/ML  SOLN
100.0000 mL | Freq: Once | INTRAMUSCULAR | Status: AC | PRN
  Administered 2022-02-05: 100 mL via INTRAVENOUS

## 2022-02-05 MED ORDER — SODIUM CHLORIDE 0.9 % IV BOLUS
1000.0000 mL | Freq: Once | INTRAVENOUS | Status: AC
  Administered 2022-02-05: 1000 mL via INTRAVENOUS

## 2022-02-05 MED ORDER — HYDROMORPHONE HCL 1 MG/ML IJ SOLN
1.0000 mg | Freq: Once | INTRAMUSCULAR | Status: AC
  Administered 2022-02-05: 1 mg via INTRAVENOUS
  Filled 2022-02-05: qty 1

## 2022-02-05 MED ORDER — SODIUM CHLORIDE (PF) 0.9 % IJ SOLN
INTRAMUSCULAR | Status: AC
  Filled 2022-02-05: qty 50

## 2022-02-05 NOTE — ED Triage Notes (Addendum)
Pt c/o generalized abdominal pain and more so on the right.   9/10 pain  Pt reports taking oxycodone at home with no relief.   Requesting CT    A/Ox4 Ambulatory with cane   Hx: Kidney cancer  Last chemo in March per pt

## 2022-02-05 NOTE — ED Notes (Signed)
Was unable to get blood

## 2022-02-05 NOTE — ED Provider Triage Note (Signed)
Emergency Medicine Provider Triage Evaluation Note  Dylan Good , a 51 y.o. male  was evaluated in triage.  Pt complains of abdominal pain, nausea, and vomiting.  Reports symptoms have gotten worse since his recent ER visit.  Has been taking Phenergan with occasional relief.  Last p.o. intake last night.  Denies fever.  Denies difficulty urinating, dysuria.  Is requesting imaging of the abdomen..  Review of Systems  Positive: As above Negative: As above  Physical Exam  BP 134/81 (BP Location: Right Arm)   Pulse (!) 119   Temp 98.6 F (37 C) (Oral)   Resp 18   SpO2 96%  Gen:   Awake, no distress   Resp:  Normal effort  MSK:   Moves extremities without difficulty  Other:  Generalized abdominal tenderness present.  Medical Decision Making  Medically screening exam initiated at 1:20 PM.  Appropriate orders placed.  Dylan Good was informed that the remainder of the evaluation will be completed by another provider, this initial triage assessment does not replace that evaluation, and the importance of remaining in the ED until their evaluation is complete.     Dylan Courier, PA-C 02/05/22 1321

## 2022-02-05 NOTE — ED Provider Notes (Signed)
[facility] DEPT Provider Note   CSN: [id number] Arrival date & time: 02/05/22  1302     History  Chief Complaint  Patient presents with   Abdominal Pain    Dylan Good is a 51 y.o. male.  HPI He presents for evaluation of difficulty eating with frequent vomiting after eating and drinking.  Onset several weeks ago and worsening.  Currently receive cancer and hospice area here.  He had a stent placed in his right mainstem bronchus, 3 months ago, as a palliative procedure.  He denies fever, chills, blood in emesis, diarrhea, focal weakness or paresthesia.    Home Medications Prior to Admission medications   Medication Sig Start Date End Date Taking? Authorizing Provider  acetaminophen (TYLENOL) 325 MG tablet Take 2 tablets (650 mg total) by mouth every 6 (six) hours as needed for mild pain (or Fever >/= 101). Patient not taking: Reported on 11/25/2021 10/07/21   Arrien, [name], MD  albuterol (PROVENTIL) (2.5 MG/3ML) 0.083% nebulizer solution Take 2.5 mg by nebulization every 6 (six) hours as needed for wheezing or shortness of breath.    [provider]  amLODipine (NORVASC) 5 MG tablet TAKE 1 TABLET (5 MG TOTAL) BY MOUTH DAILY. 07/17/21   [name], MD  B Complex-C (B-COMPLEX WITH VITAMIN C) tablet Take 1 tablet by mouth daily. 01/25/22   Swayze, Ava, DO  feeding supplement (ENSURE ENLIVE / ENSURE PLUS) LIQD Take 237 mLs by mouth 2 (two) times daily between meals. 01/26/22   Swayze, Ava, DO  lidocaine (LIDODERM) 5 % Place 2 patches onto the skin daily. Remove & Discard patch within 12 hours or as directed by MD 01/26/22   Swayze, Ava, DO  metoprolol tartrate (LOPRESSOR) 25 MG tablet Take 1/2 tablet (12.5 mg total) by mouth 2 (two) times daily. 01/25/22   Swayze, Ava, DO  Multiple Vitamin (MULTIVITAMIN WITH MINERALS) TABS tablet Take 1 tablet by mouth daily. 01/26/22   Swayze, Ava, DO  ondansetron (ZOFRAN) 4 MG tablet Take 1 tablet (4 mg  total) by mouth every 6 (six) hours as needed for nausea. Patient not taking: Reported on 11/15/2021 09/16/21   [name], MD  Oxycodone HCl 10 MG TABS Take 10 mg by mouth every 4 (four) hours as needed. 01/07/22   [provider]  prochlorperazine (COMPAZINE) 5 MG tablet Take 5 mg by mouth in the morning, at noon, and at bedtime. 01/15/22   [provider]  promethazine (PHENERGAN) 25 MG suppository Place 1 suppository (25 mg total) rectally every 8 (eight) hours as needed for nausea or vomiting. 02/02/22   Trifan, [name], MD  promethazine (PHENERGAN) 25 MG tablet Take 1 tablet (25 mg total) by mouth every 8 (eight) hours as needed for up to 12 doses for nausea or vomiting. 02/02/22   Wyvonnia Dusky, MD  SYMBICORT 80-4.5 MCG/ACT inhaler Inhale 2 puffs into the lungs daily as needed (for shortness of breath). 11/12/21   [provider]  VENTOLIN HFA 108 (90 Base) MCG/ACT inhaler Inhale 2 puffs into the lungs every 4 (four) hours as needed for shortness of breath or wheezing. 09/11/21   [provider]      Allergies    Patient has no known allergies.    Review of Systems   Review of Systems  Physical Exam Updated Vital Signs BP 120/74   Pulse 100   Temp 98.8 F (37.1 C) (Oral)   Resp 18   SpO2 96%  Physical Exam Vitals and nursing note reviewed.  Constitutional:      General: He is not in acute distress.    Appearance: He is well-developed. He is not ill-appearing.  HENT:     Head: Normocephalic and atraumatic.     Right Ear: External ear normal.     Left Ear: External ear normal.     Mouth/Throat:     Mouth: Mucous membranes are dry.     Comments: Lips are dry Eyes:     Conjunctiva/sclera: Conjunctivae normal.     Pupils: Pupils are equal, round, and reactive to light.  Neck:     Trachea: Phonation normal.  Cardiovascular:     Rate and Rhythm: Normal rate and regular rhythm.     Heart sounds: Normal heart sounds.  Pulmonary:      Effort: Pulmonary effort is normal.     Breath sounds: Normal breath sounds.  Abdominal:     General: There is no distension.     Palpations: Abdomen is soft. There is no mass.     Tenderness: There is no abdominal tenderness (Diffuse, mild).     Hernia: No hernia is present.     Comments: Prior operative site scarring noted  Musculoskeletal:        General: Normal range of motion.     Cervical back: Normal range of motion and neck supple.  Skin:    General: Skin is warm and dry.  Neurological:     Mental Status: He is alert and oriented to person, place, and time.     Cranial Nerves: No cranial nerve deficit.     Sensory: No sensory deficit.     Motor: No abnormal muscle tone.     Coordination: Coordination normal.  Psychiatric:        Mood and Affect: Mood normal.        Behavior: Behavior normal.        Thought Content: Thought content normal.        Judgment: Judgment normal.     ED Results / Procedures / Treatments   Labs (all labs ordered are listed, but only abnormal results are displayed) Labs Reviewed  CBC WITH DIFFERENTIAL/PLATELET - Abnormal; Notable for the following components:      Result Value   WBC 3.5 (*)    Hemoglobin 10.9 (*)    HCT 34.6 (*)    MCV 77.1 (*)    MCH 24.3 (*)    RDW 16.4 (*)    Lymphs Abs 0.6 (*)    All other components within normal limits  COMPREHENSIVE METABOLIC PANEL - Abnormal; Notable for the following components:   Sodium 133 (*)    Calcium 11.6 (*)    Albumin 3.4 (*)    AST 13 (*)    All other components within normal limits  URINALYSIS, ROUTINE W REFLEX MICROSCOPIC - Abnormal; Notable for the following components:   Hgb urine dipstick MODERATE (*)    Ketones, ur 5 (*)    Protein, ur 30 (*)    Leukocytes,Ua SMALL (*)    Bacteria, UA RARE (*)    All other components within normal limits  LIPASE, BLOOD    EKG None  Radiology CT ABDOMEN PELVIS W CONTRAST  Result Date: 02/05/2022 CLINICAL DATA:  Acute nonlocalized  abdominal pain. Known metastatic renal cancer. EXAM: CT ABDOMEN AND PELVIS WITH CONTRAST TECHNIQUE: Multidetector CT imaging of the abdomen and pelvis was performed using the standard protocol following bolus administration of intravenous contrast. RADIATION  DOSE REDUCTION: This exam was performed according to the departmental dose-optimization program which includes automated exposure control, adjustment of the mA and/or kV according to patient size and/or use of iterative reconstruction technique. CONTRAST:  128mL OMNIPAQUE IOHEXOL 300 MG/ML  SOLN COMPARISON:  CT abdomen and pelvis 01/22/2022 FINDINGS: Lower chest: There are again extensive metastatic pulmonary nodules. The largest within the medial left lower lobe measures up to approximately 4.4 cm (axial series 6, image 35) unchanged. The largest within the medial right lower lobe measures up to approximately 2.2 cm, unchanged. Mild left-greater-than-right pleural effusions are minimally worsened from prior. Partial visualization of an anterolateral inferior upper lobe pleural metastasis. Hepatobiliary: Smooth liver contours. No focal liver lesion is seen. No intrahepatic or extrahepatic biliary ductal dilatation. The gallbladder is unremarkable. Pancreas: The pancreas again appears atrophic. Mild mass effect on the pancreatic head from the known large renal mass and mild mass effect on the posterior pancreatic tail from the known large left adrenal metastasis. Spleen: Normal in size without focal abnormality. Adrenals/Urinary Tract: Right adrenal metastasis measures up to approximately 4.3 cm an AP dimension, unchanged. Left adrenal metastasis measures up to approximately 3.7 x 7.8 by 7.8 cm (transverse by AP by craniocaudal), unchanged. Large necrotic anterior right lower pole renal mass measures up to 11.2 by 11.1 by 11.8 cm, not significantly changed from recent prior CT. Bilateral left renal midpole heterogeneously enhancing soft tissue mass measuring  approximately 2.3 cm is also unchanged from recent CT. There are multiple bilateral additional renal low-attenuation lesions. Those which are large enough to characterize again demonstrate fluid density suggesting benign cysts. (No follow-up imaging recommended.) No significant change. No hydronephrosis. The urinary bladder is decompressed, limiting evaluation. Stomach/Bowel: No dilated loops of bowel are seen to indicate bowel obstruction. No bowel wall thickening is seen. The appendix is reportedly surgically absent. Vascular/Lymphatic: No abdominal aortic aneurysm. Mild atherosclerotic calcifications. The major intra-abdominal aortic branch vessels appear patent. There are again multiple mildly enlarged upper abdominal mesenteric and retroperitoneal lymph nodes. Reference left periaortic 1.3 cm short axis lymph node (axial series 2, image 44) is unchanged. Reproductive: The prostate and seminal vesicles are grossly unremarkable. Other: No ventral abdominal wall hernia.  No pneumoperitoneum. Musculoskeletal: A rim enhancing 11 mm soft tissue nodule is seen within the left buttock subcutaneous fat soft tissues, nonspecific but unchanged from prior (axial series 2, image 67). This is new from 06/19/2021 and 08/28/2021 and 10/04/2021 CTs. It may represent an injection granuloma. Recommend attention on follow-up. Dominant L1 lytic lesion again involves the majority of the L1 vertebral body. Round lytic lesion within the left femoral neck measuring up to 10 mm is unchanged from prior extensive bilateral sacroiliac joint subchondral sclerosis and small cystic changes are similar to prior. IMPRESSION: 1. No significant change compared to recent 01/22/2022 CT. 2. No acute intra-abdominal process. 3. Right-greater-than-left renal cell carcinomas, left-greater-than-right adrenal metastases, and L1 vertebral body and left femoral neck osseous metastases are similar to prior. 4. Extensive intrathoracic metastatic disease is  again seen. Minimally worsened mild left-greater-than-right pleural effusions. 5. 11 mm rim enhancing soft tissue nodule within the left buttock subcutaneous fat may represent an injection granuloma. This is unchanged from recent 01/22/2022 CT but new from more remote CTs. Recommend attention on follow-up. 6. Aortic Atherosclerosis (ICD10-I70.0). Electronically Signed   By: Yvonne Kendall M.D.   On: 02/05/2022 17:48    Procedures Procedures    Medications Ordered in ED Medications  0.9 %  sodium chloride infusion (0 mLs  Intravenous Stopped 02/05/22 1929)  sodium chloride (PF) 0.9 % injection (has no administration in time range)  sodium chloride 0.9 % bolus 1,000 mL (0 mLs Intravenous Stopped 02/05/22 1743)  iohexol (OMNIPAQUE) 300 MG/ML solution 100 mL (100 mLs Intravenous Contrast Given 02/05/22 1657)  HYDROmorphone (DILAUDID) injection 1 mg (1 mg Intravenous Given 02/05/22 1744)    ED Course/ Medical Decision Making/ A&P Clinical Course as of 02/05/22 2248  Tue Feb 05, 2022  1814 CT ABDOMEN PELVIS W CONTRAST [EW]    Clinical Course User Index [EW] Daleen Bo, MD                           Medical Decision Making Patient with metastatic cancer presenting with abdominal discomfort, vomiting and malaise.  He appears clinically dehydrated.  He has had prior abdominal surgery, will evaluate for bowel obstruction, dehydration, occult infection and metabolic disorders.  Initiate treatment with IV fluid bolus and check orthostatic vital signs.  Problems Addressed: Carcinoma of right kidney [facility]): chronic illness or injury with exacerbation, progression, or side effects of treatment that poses a threat to life or bodily functions Metastatic malignant neoplasm, unspecified site [facility]): chronic illness or injury with exacerbation, progression, or side effects of treatment  Amount and/or Complexity of Data Reviewed Independent Historian:     Details: He is a cogent historian External Data Reviewed:  notes.    Details: Oncology clinic notes and palliative care consultation, in the EMR. Labs: ordered.    Details: CBC, metabolic panel, lipase, urinalysis-normal except white count low, sodium low, calcium high, AST low, urinalysis abdomen Radiology:  Decision-making details documented in ED Course.  Risk Prescription drug management. Decision regarding hospitalization. Risk Details: Patient with stable chronic findings including metastatic cancer with secondary renal carcinoma both likely contributing to pain syndrome.  Persistent elevation of calcium.  Patient currently getting palliative care consultation and treatment.  He is on chronic narcotic with both short and long acting medications.  He does not require hospitalization for management at this time.  Instructed to follow-up with palliative care and oncology as soon as possible.           Final Clinical Impression(s) / ED Diagnoses Final diagnoses:  Metastatic malignant neoplasm, unspecified site [facility])  Carcinoma of right kidney [facility])    Rx / DC Orders ED Discharge Orders     None         Daleen Bo, MD 02/05/22 2248

## 2022-02-05 NOTE — Telephone Encounter (Signed)
Scheduled per 7/31 in basket, pt confirmed appt

## 2022-02-05 NOTE — Discharge Instructions (Signed)
The pain you are having is likely secondary to the metastatic cancer and the renal cell carcinoma.  Continue taking the long-acting morphine, and oxycodone pain medicine which you have been prescribed.  Call your oncologist, and palliative care provider to discuss changing medications, to help support your discomfort better.  Return here, if needed for problems.

## 2022-02-07 ENCOUNTER — Inpatient Hospital Stay: Payer: Medicaid Other | Attending: Oncology | Admitting: Oncology

## 2022-02-07 ENCOUNTER — Other Ambulatory Visit: Payer: Self-pay

## 2022-02-07 VITALS — BP 126/74 | HR 125 | Temp 98.0°F | Resp 18 | Ht 69.0 in | Wt 140.6 lb

## 2022-02-07 DIAGNOSIS — Z923 Personal history of irradiation: Secondary | ICD-10-CM | POA: Insufficient documentation

## 2022-02-07 DIAGNOSIS — K859 Acute pancreatitis without necrosis or infection, unspecified: Secondary | ICD-10-CM | POA: Diagnosis not present

## 2022-02-07 DIAGNOSIS — C641 Malignant neoplasm of right kidney, except renal pelvis: Secondary | ICD-10-CM | POA: Diagnosis present

## 2022-02-07 DIAGNOSIS — C7931 Secondary malignant neoplasm of brain: Secondary | ICD-10-CM | POA: Diagnosis not present

## 2022-02-07 DIAGNOSIS — F17210 Nicotine dependence, cigarettes, uncomplicated: Secondary | ICD-10-CM | POA: Diagnosis not present

## 2022-02-07 NOTE — Progress Notes (Signed)
Hematology and Oncology Follow Up Visit  Dylan Good [id number] 21-Jul-1970 51 y.o. 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   Prior Therapy:   He is status post thoracentesis completed in June 2022 and repeated on January 02, 2021.  He is s/p Pleurx catheter insertion in August 2022.  He is status post stereotactic radiosurgery to the brain completed on January 29, 2021.  He received 20 Gray in 1 fraction.  He is status post kidney biopsy completed on March 06, 2021.  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  Ipilimumab 1 mg/kg with nivolumab 3 mg/kg started on January 15, 2021.  He completed 4 cycles of therapy.  Nivolumab 480 mg every 4 weeks to start on April 10, 2021.  Therapy discontinued due to progression of disease.  Cabometyx 40 mg started on May 11, 2021.  Therapy interrupted during frequent hospitalization between December and March 2023.  Therapy discontinued in April 2023.  Current therapy: Supportive care only.  Interim History: Mr. Dylan Good presents today for return evaluation.  Since last visit, he had recurrent hospitalizations predominantly related to worsening pulmonary disease that required right mainstem bronchus stent that was placed on Nov 29, 2021 at [facility].  He has been on hospice since that time without any additional treatment.  Clinically, he reports overall stable clinical status with predominantly GI complaints of nausea and abdominal discomfort but no shortness of breath or difficulty breathing.  His performance status is limited but has not declined any further.  He is ambulating short distances.  Medications: Updated on review.     Allergies: No Known Allergies    Physical Exam:   Blood pressure 126/74, pulse (!) 125, temperature 98 F (36.7  C), temperature source Oral, resp. rate 18, height 5\' 9"  (1.753 m), weight 140 lb 9.6 oz (63.8 kg), SpO2 98 %.      ECOG: 2   General appearance: Comfortable appearing without any discomfort Head: Normocephalic without any trauma Oropharynx: Mucous membranes are moist and pink without any thrush or ulcers. Eyes: Pupils are equal and round reactive to light. Lymph nodes: No cervical, supraclavicular, inguinal or axillary lymphadenopathy.   Heart:regular rate and rhythm.  S1 and S2 without leg edema. Lung: Clear without any rhonchi or wheezes.  No dullness to percussion. Abdomin: Soft, nontender, nondistended with good bowel sounds.  No hepatosplenomegaly. Musculoskeletal: No joint deformity or effusion.  Full range of motion noted. Neurological: No deficits noted on motor, sensory and deep tendon reflex exam. Skin: No petechial rash or dryness.  Appeared moist.        Lab Results: Lab Results  Component Value Date   WBC 3.5 (L) 02/05/2022   HGB 10.9 (L) 02/05/2022   HCT 34.6 (L) 02/05/2022   MCV 77.1 (L) 02/05/2022   PLT 311 02/05/2022     Chemistry      Component Value Date/Time   NA 133 (L) 02/05/2022 1616   K 3.6 02/05/2022 1616   CL 103 02/05/2022 1616   CO2 23 02/05/2022 1616   BUN 8 02/05/2022 1616   CREATININE 0.67 02/05/2022 1616   CREATININE 0.61 11/02/2021 0936      Component Value Date/Time   CALCIUM 11.6 (H) 02/05/2022 1616   CALCIUM NOT PERFORMED 11/15/2021 1545   ALKPHOS 58 02/05/2022 1616   AST 13 (L) 02/05/2022 1616   AST  8 (L) 11/02/2021 0936   ALT 8 02/05/2022 1616   ALT 5 11/02/2021 0936   BILITOT 0.6 02/05/2022 1616   BILITOT 1.1 11/02/2021 0936     IMPRESSION: 1. New paramediastinal linear consolidation of the right upper lobe, likely post radiation change. 2. Slight decreased size of mediastinal and right hilar lymphadenopathy. Patent right mainstem bronchus stent. 3. Interval decreased size of left chest wall soft tissue mass. 4.  Slightly increased size of bilateral pulmonary nodules. Extensive left pleural lesions are increased or stable in size when compared to prior exam. 5. Small left-greater-than-right pleural effusions. 6. Compared with October 04, 2021 CT of the abdomen and pelvis, large right renal mass, left renal mass, bilateral adrenal gland masses, L1 vertebral body lytic lesion and a left para-aortic lymph node are increased in size.    IMPRESSION: 1. No significant change compared to recent 01/22/2022 CT. 2. No acute intra-abdominal process. 3. Right-greater-than-left renal cell carcinomas, left-greater-than-right adrenal metastases, and L1 vertebral body and left femoral neck osseous metastases are similar to prior. 4. Extensive intrathoracic metastatic disease is again seen. Minimally worsened mild left-greater-than-right pleural effusions. 5. 11 mm rim enhancing soft tissue nodule within the left buttock subcutaneous fat may represent an injection granuloma. This is unchanged from recent 01/22/2022 CT but new from more remote CTs. Recommend attention on follow-up. 6. Aortic Atherosclerosis (ICD10-I70.0).  Impression and Plan:   51 year old man with:  1.  Sage IV clear-cell renal cell carcinoma with pulmonary involvement diagnosed in June 2022.     His disease status was updated at this time and treatment choices were discussed today in detail.  His performance status is marginal and his cancer is progressive.  Different salvage therapy options including axitinib was discussed with the likelihood of meaningful clinical benefit is limited.  I am concerned about the side effects that comes of this medication including nausea, fatigue, hypertension and hand-foot syndrome.  After discussion today, he will consider his options which include starting axitinib versus transitioning to hospice.    2.  Hypercalcemia: His calcium is mildly elevated and asymptomatic at this time. had recurrent issue  with the possibility of Cabometyx as the offending agent at this time.    3.  Goals of care and prognosis: This was discussed in detail.  He understands his disease is progressive and incurable.  His life expectancy is limited.  4.  Follow-up: Will be determined pending his decision.   30  minutes were spent on this encounter.  Time was dedicated to reviewing laboratory data, disease status update and outlining future plan of care discussion.   Zola Button, MD 8/3/20232:52 PM

## 2022-02-08 ENCOUNTER — Telehealth: Payer: Self-pay

## 2022-02-08 ENCOUNTER — Telehealth: Payer: Self-pay | Admitting: Oncology

## 2022-02-08 ENCOUNTER — Other Ambulatory Visit (HOSPITAL_COMMUNITY): Payer: Self-pay

## 2022-02-08 ENCOUNTER — Telehealth: Payer: Self-pay | Admitting: *Deleted

## 2022-02-08 ENCOUNTER — Telehealth: Payer: Self-pay | Admitting: Pharmacy Technician

## 2022-02-08 MED ORDER — AXITINIB 5 MG PO TABS
5.0000 mg | ORAL_TABLET | Freq: Every day | ORAL | 1 refills | Status: DC
  Filled 2022-02-08 – 2022-02-12 (×3): qty 30, 30d supply, fill #0
  Filled 2022-03-12: qty 30, 30d supply, fill #1

## 2022-02-08 NOTE — Telephone Encounter (Signed)
Oral Oncology Patient Advocate Encounter  After completing a benefits investigation, prior authorization for Inlyta is not required at this time through [employer].  Patient's copay is $4.     Lady Deutscher, CPhT-Adv Pharmacy Patient Advocate Specialist [place] [facility] Patient Advocate Team Direct Number: [phone]  Fax: [phone]

## 2022-02-08 NOTE — Telephone Encounter (Signed)
Oral Oncology Pharmacist Encounter  Received new prescription for axitinib (Inlyta) for the treatment of stage IV clear-cell renal cell carcinoma, planned duration until disease progression or unacceptable toxicity.  Labs from 02/05/22 assessed, no interventions needed. Prescription dose and frequency assessed.   Current medication list in Epic reviewed, DDIs with Inlyta identified:  Evaluated chart and no patient barriers to medication adherence noted.   Patient agreement for treatment documented in MD note on 02/08/22.  Prescription has been e-scribed to the [facility] for benefits analysis and approval.  Oral Oncology Clinic will continue to follow for insurance authorization, copayment issues, initial counseling and start date.  Drema Halon, PharmD Hematology/Oncology Clinical Pharmacist [facility] [phone] 02/08/2022 9:49 AM

## 2022-02-08 NOTE — Addendum Note (Signed)
Addended by: [name] on: 02/08/2022 09:28 AM   Modules accepted: Orders

## 2022-02-08 NOTE — Telephone Encounter (Signed)
Dr Alen Blew notified that Mr Brailsford wants to start treatment

## 2022-02-08 NOTE — Telephone Encounter (Signed)
Per 8/3 in basket, pt has been called and confirmed appt

## 2022-02-11 ENCOUNTER — Other Ambulatory Visit (HOSPITAL_COMMUNITY): Payer: Self-pay

## 2022-02-11 NOTE — Telephone Encounter (Signed)
Oral Chemotherapy Pharmacist Encounter  Attempted to reach patient to discuss new medication, Inlyta. Voicemail left. Will attempt to call again at a later date.  Drema Halon, PharmD Hematology/Oncology Clinical Pharmacist [facility] [phone] 02/11/2022   12:25 PM

## 2022-02-12 ENCOUNTER — Emergency Department (HOSPITAL_COMMUNITY)
Admission: EM | Admit: 2022-02-12 | Discharge: 2022-02-12 | Disposition: A | Discharge status: Home / Self Care | Payer: Medicaid Other | Attending: Emergency Medicine | Admitting: Emergency Medicine

## 2022-02-12 ENCOUNTER — Other Ambulatory Visit: Payer: Self-pay

## 2022-02-12 ENCOUNTER — Other Ambulatory Visit (HOSPITAL_COMMUNITY): Payer: Self-pay

## 2022-02-12 ENCOUNTER — Encounter (HOSPITAL_COMMUNITY): Payer: Self-pay

## 2022-02-12 DIAGNOSIS — Z79899 Other long term (current) drug therapy: Secondary | ICD-10-CM | POA: Insufficient documentation

## 2022-02-12 DIAGNOSIS — R112 Nausea with vomiting, unspecified: Secondary | ICD-10-CM | POA: Diagnosis not present

## 2022-02-12 DIAGNOSIS — R109 Unspecified abdominal pain: Secondary | ICD-10-CM | POA: Diagnosis present

## 2022-02-12 DIAGNOSIS — C801 Malignant (primary) neoplasm, unspecified: Secondary | ICD-10-CM | POA: Insufficient documentation

## 2022-02-12 DIAGNOSIS — I10 Essential (primary) hypertension: Secondary | ICD-10-CM | POA: Insufficient documentation

## 2022-02-12 DIAGNOSIS — R1084 Generalized abdominal pain: Secondary | ICD-10-CM | POA: Diagnosis not present

## 2022-02-12 LAB — URINALYSIS, ROUTINE W REFLEX MICROSCOPIC
Bilirubin Urine: NEGATIVE
Glucose, UA: NEGATIVE mg/dL
Ketones, ur: 80 mg/dL — AB
Nitrite: NEGATIVE
Protein, ur: 30 mg/dL — AB
RBC / HPF: >50 RBC/hpf — ABNORMAL HIGH (ref 0–5)
Specific Gravity, Urine: 1.014 (ref 1.005–1.030)
WBC, UA: >50 WBC/hpf — ABNORMAL HIGH (ref 0–5)
pH: 6 (ref 5.0–8.0)

## 2022-02-12 LAB — CBC
HCT: 36.4 % — ABNORMAL LOW (ref 39.0–52.0)
Hemoglobin: 11.2 g/dL — ABNORMAL LOW (ref 13.0–17.0)
MCH: 24.1 pg — ABNORMAL LOW (ref 26.0–34.0)
MCHC: 30.8 g/dL (ref 30.0–36.0)
MCV: 78.4 fL — ABNORMAL LOW (ref 80.0–100.0)
Platelets: 368 10*3/uL (ref 150–400)
RBC: 4.64 MIL/uL (ref 4.22–5.81)
RDW: 16.6 % — ABNORMAL HIGH (ref 11.5–15.5)
WBC: 4.2 10*3/uL (ref 4.0–10.5)
nRBC: 0 % (ref 0.0–0.2)

## 2022-02-12 LAB — COMPREHENSIVE METABOLIC PANEL
ALT: 8 U/L (ref 0–44)
AST: 16 U/L (ref 15–41)
Albumin: 3.6 g/dL (ref 3.5–5.0)
Alkaline Phosphatase: 55 U/L (ref 38–126)
Anion gap: 9 (ref 5–15)
BUN: 10 mg/dL (ref 6–20)
CO2: 23 mmol/L (ref 22–32)
Calcium: 14.9 mg/dL (ref 8.9–10.3)
Chloride: 105 mmol/L (ref 98–111)
Creatinine, Ser: 0.73 mg/dL (ref 0.61–1.24)
GFR, Estimated: >60 mL/min (ref >60)
Glucose, Bld: 84 mg/dL (ref 70–99)
Potassium: 4.3 mmol/L (ref 3.5–5.1)
Sodium: 137 mmol/L (ref 135–145)
Total Bilirubin: 1.1 mg/dL (ref 0.3–1.2)
Total Protein: 7.8 g/dL (ref 6.5–8.1)

## 2022-02-12 LAB — LIPASE, BLOOD: Lipase: 19 U/L (ref 11–51)

## 2022-02-12 MED ORDER — HYDROMORPHONE HCL 1 MG/ML IJ SOLN
1.0000 mg | Freq: Once | INTRAMUSCULAR | Status: AC
  Administered 2022-02-12: 1 mg via INTRAVENOUS
  Filled 2022-02-12: qty 1

## 2022-02-12 MED ORDER — ZOLEDRONIC ACID 4 MG/5ML IV CONC
4.0000 mg | Freq: Once | INTRAVENOUS | Status: AC
  Administered 2022-02-12: 4 mg via INTRAVENOUS
  Filled 2022-02-12: qty 5

## 2022-02-12 MED ORDER — SODIUM CHLORIDE 0.9 % IV BOLUS
1000.0000 mL | Freq: Once | INTRAVENOUS | Status: AC
  Administered 2022-02-12: 1000 mL via INTRAVENOUS

## 2022-02-12 MED ORDER — ONDANSETRON HCL 4 MG/2ML IJ SOLN
4.0000 mg | Freq: Once | INTRAMUSCULAR | Status: AC
  Administered 2022-02-12: 4 mg via INTRAVENOUS
  Filled 2022-02-12: qty 2

## 2022-02-12 NOTE — Discharge Instructions (Signed)
You were seen today for abdominal pain and nausea.  This is likely due to your underlying cancer.  You were given medication here today including pain medication and nausea medication.  You were also given a medication to help reduce your calcium levels.  Please follow-up with your primary care and oncology teams for further pain management as needed.

## 2022-02-12 NOTE — ED Notes (Signed)
Urinal at bedside.  

## 2022-02-12 NOTE — ED Triage Notes (Addendum)
Patient c/o left abdominal pain and emesis x 2 days. Patient's SO states that he is to start chemo in 2 days.   Patient's significant other reports that the patient has been more lethargic and confused.

## 2022-02-12 NOTE — Telephone Encounter (Signed)
Oral Chemotherapy Pharmacist Encounter  I spoke with patient and girlfriend for overview of: Inlyta (axitinib) for the treatment of previously treated, advanced or metastatic renal cell carcinoma, planned duration until disease progression or unacceptable toxicity.   Counseled patient on administration, dosing, side effects, monitoring, drug-food interactions, safe handling, storage, and disposal.  Patient will take Inlyta 5mg  tablets, 1 tablet by mouth daily, without regard to food, with a glass of water.  Patient instructed to avoid grapefruit and grapefruit juice while on therapy with Inlyta.  Inlyta start date: 02/14/2022  Adverse effects include but are not limited to: hypertension, hand-foot syndrome, nausea, vomiting, diarrhea, fatigue, dysphonia (hoarseness), and abnormal laboratory values.   Patient will obtain anti diarrheal and alert the office of 4 or more loose stools above baseline.  Inlyta will be held at least 24 hours prior to surgery and resumed at discretion of treating physician based on wound healing  Reviewed with patient importance of keeping a medication schedule and plan for any missed doses. No barriers to medication adherence identified.  Medication reconciliation performed and medication/allergy list updated.  Insurance authorization for [employer] has been obtained. Test claim at the pharmacy revealed copayment $4 for 1st fill of 30 days. This will ship from the [place] [facility] on 02/12/22 to deliver to patient's home on 02/13/22.  Patient informed the pharmacy will reach out 5-7 days prior to needing next fill of Inlyta to coordinate continued medication acquisition to prevent break in therapy.  All questions answered.  Mr. Schiele voiced understanding and appreciation.   Medication education handout placed in mail for patient. Patient knows to call the office with questions or concerns. [facility] phone number provided to patient.    Drema Halon, PharmD Hematology/Oncology Clinical Pharmacist [facility] [phone]

## 2022-02-12 NOTE — Telephone Encounter (Signed)
Oral Chemotherapy Pharmacist Encounter   Attempted to reach patient to discuss new medication, Inlyta. Second voicemail left. Will attempt to call again tomorrow.    Drema Halon, PharmD Hematology/Oncology Clinical Pharmacist [facility] [phone]

## 2022-02-12 NOTE — ED Provider Notes (Signed)
Dylan Good Provider Note   CSN: [id number] Arrival date & time: 02/12/22  1240     History  Chief Complaint  Patient presents with   Abdominal Pain   Emesis    Dylan Good is a 51 y.o. male.  Patient presents to the hospital complaining of left-sided abdominal pain and emesis for the past 2 days.  He also complains of dehydration.Patient states that the issues worsened 2 days ago. He takes pain medications at home. He has been seen multiple times over the past month for similar conditions. He has a history of a metastatic cancer currently in the kidney, bones, and lungs. He receives cancer care in this area and is due to start a new chemotherapy regimen next week. He is also on hospice care. He denies fevers, chills, diarrhea, weakness, blood in stool, blood in emesis, headache, chest pain, shortness of breath.  Past medical history significant for kidney with metastases, hypercalcemia, brain metastases, history of left-sided pleural effusion, intractable pain, hypertension pancreatitis, metastases and lung, neutropenia, hyponatremia, GERD, malnutrition, street of acute respiratory failure with hypoxia, nausea and vomiting  HPI     Home Medications Prior to Admission medications   Medication Sig Start Date End Date Taking? Authorizing Provider  acetaminophen (TYLENOL) 325 MG tablet Take 2 tablets (650 mg total) by mouth every 6 (six) hours as needed for mild pain (or Fever >/= 101). Patient not taking: Reported on 11/25/2021 10/07/21   Arrien, [name], MD  albuterol (PROVENTIL) (2.5 MG/3ML) 0.083% nebulizer solution Take 2.5 mg by nebulization every 6 (six) hours as needed for wheezing or shortness of breath.    [provider]  amLODipine (NORVASC) 5 MG tablet TAKE 1 TABLET (5 MG TOTAL) BY MOUTH DAILY. 07/17/21   [name], MD  axitinib (INLYTA) 5 MG tablet Take 1 tablet (5 mg total) by mouth daily. 02/08/22   [name], MD   B Complex-C (B-COMPLEX WITH VITAMIN C) tablet Take 1 tablet by mouth daily. 01/25/22   Swayze, Ava, DO  feeding supplement (ENSURE ENLIVE / ENSURE PLUS) LIQD Take 237 mLs by mouth 2 (two) times daily between meals. 01/26/22   Swayze, Ava, DO  lidocaine (LIDODERM) 5 % Place 2 patches onto the skin daily. Remove & Discard patch within 12 hours or as directed by MD 01/26/22   Swayze, Ava, DO  metoprolol tartrate (LOPRESSOR) 25 MG tablet Take 1/2 tablet (12.5 mg total) by mouth 2 (two) times daily. 01/25/22   Swayze, Ava, DO  Multiple Vitamin (MULTIVITAMIN WITH MINERALS) TABS tablet Take 1 tablet by mouth daily. 01/26/22   Swayze, Ava, DO  ondansetron (ZOFRAN) 4 MG tablet Take 1 tablet (4 mg total) by mouth every 6 (six) hours as needed for nausea. Patient not taking: Reported on 11/15/2021 09/16/21   [name], MD  Oxycodone HCl 10 MG TABS Take 10 mg by mouth every 4 (four) hours as needed. 01/07/22   [provider]  prochlorperazine (COMPAZINE) 5 MG tablet Take 5 mg by mouth in the morning, at noon, and at bedtime. 01/15/22   [provider]  promethazine (PHENERGAN) 25 MG suppository Place 1 suppository (25 mg total) rectally every 8 (eight) hours as needed for nausea or vomiting. 02/02/22   Trifan, [name], MD  promethazine (PHENERGAN) 25 MG tablet Take 1 tablet (25 mg total) by mouth every 8 (eight) hours as needed for up to 12 doses for nausea or vomiting. 02/02/22   Trifan,  [name], MD  SYMBICORT 80-4.5 MCG/ACT inhaler Inhale 2 puffs into the lungs daily as needed (for shortness of breath). 11/12/21   [provider]  VENTOLIN HFA 108 (90 Base) MCG/ACT inhaler Inhale 2 puffs into the lungs every 4 (four) hours as needed for shortness of breath or wheezing. 09/11/21   [provider]      Allergies    Patient has no known allergies.    Review of Systems   Review of Systems  Constitutional:  Negative for fever.  Respiratory:  Negative for shortness of breath.    Cardiovascular:  Negative for chest pain.  Gastrointestinal:  Positive for abdominal pain, nausea and vomiting. Negative for constipation and diarrhea.  Genitourinary:  Negative for dysuria.    Physical Exam Updated Vital Signs BP (!) 145/82   Pulse (!) 112   Temp 98.4 F (36.9 C) (Oral)   Resp 19   Ht 5\' 9"  (1.753 m)   Wt 63.5 kg   SpO2 97%   BMI 20.67 kg/m  Physical Exam Vitals and nursing note reviewed.  Constitutional:      General: He is not in acute distress. HENT:     Head: Normocephalic and atraumatic.  Eyes:     Extraocular Movements: Extraocular movements intact.  Cardiovascular:     Rate and Rhythm: Normal rate and regular rhythm.     Heart sounds: Normal heart sounds.  Pulmonary:     Effort: Pulmonary effort is normal.     Breath sounds: Normal breath sounds.  Abdominal:     General: Abdomen is flat.     Palpations: Abdomen is soft.     Tenderness: There is no abdominal tenderness.  Skin:    General: Skin is warm and dry.     Capillary Refill: Capillary refill takes less than 2 seconds.  Neurological:     Mental Status: He is alert and oriented to person, place, and time.     ED Results / Procedures / Treatments   Labs (all labs ordered are listed, but only abnormal results are displayed) Labs Reviewed  COMPREHENSIVE METABOLIC PANEL - Abnormal; Notable for the following components:      Result Value   Calcium 14.9 (*)    All other components within normal limits  CBC - Abnormal; Notable for the following components:   Hemoglobin 11.2 (*)    HCT 36.4 (*)    MCV 78.4 (*)    MCH 24.1 (*)    RDW 16.6 (*)    All other components within normal limits  URINALYSIS, ROUTINE W REFLEX MICROSCOPIC - Abnormal; Notable for the following components:   APPearance HAZY (*)    Hgb urine dipstick MODERATE (*)    Ketones, ur 80 (*)    Protein, ur 30 (*)    Leukocytes,Ua MODERATE (*)    RBC / HPF >50 (*)    WBC, UA >50 (*)    Bacteria, UA RARE (*)    All  other components within normal limits  LIPASE, BLOOD    EKG EKG Interpretation  Date/Time:  Tuesday February 12 2022 14:26:14 EDT Ventricular Rate:  99 PR Interval:  151 QRS Duration: 85 QT Interval:  340 QTC Calculation: 437 R Axis:   78 Text Interpretation: Sinus rhythm Confirmed by Nanda Quinton [phone]) on 02/12/2022 2:32:33 PM  Radiology No results found.  Procedures Procedures    Medications Ordered in ED Medications  HYDROmorphone (DILAUDID) injection 1 mg (1 mg Intravenous Given 02/12/22 1404)  sodium chloride  0.9 % bolus 1,000 mL (0 mLs Intravenous Stopped 02/12/22 1513)  ondansetron (ZOFRAN) injection 4 mg (4 mg Intravenous Given 02/12/22 1403)  zoledronic acid (ZOMETA) 4 mg in sodium chloride 0.9 % 100 mL IVPB (0 mg Intravenous Stopped 02/12/22 1555)    ED Course/ Medical Decision Making/ A&P                           Medical Decision Making Amount and/or Complexity of Data Reviewed Labs: ordered.  Risk Prescription drug management.   The patient presents with a chief complaint of abdominal pain with nausea and vomiting.  Differential includes but is not limited to pain due to his metastatic cancer, appendicitis, cholecystitis, pancreatitis, and others  The patient has no focal pain to suggest cholecystitis or appendicitis.  He has no significant tenderness to palpation there would suggest mesenteric ischemia or other acute abdominal pathology.  Lipase is 19 showing no signs of appendicitis at this time.  This seems to be likely a flare of pain due to his metastatic cancer  I ordered and reviewed lab results.  Results significant for hypercalcemia at 14.9, hemoglobin 11.2, lipase 19  I considered imaging of the patient's abdomen based on his presentation and similarity to recent visits, feel that this will be unnecessary today's visit.  I ordered the patient Dilaudid for pain, Zofran for nausea, and saline for rehydration.  Upon reassessment patient's pain and nausea  had improved.  I ordered an EKG which showed an underlying rhythm of sinus rhythm.  Requested consultation with oncology.  Dr. Burr Medico discussed the case with me.  I had some concerns about discharging the patient home with recurrent hypercalcemia.  Hypercalcemia is likely due to the patient's metastases and bone.  Recommendations were to give the patient an infusion of Zometa.  Appreciate consult  The patient would like to discharge home.  This seems perfectly reasonable.  I do not believe there is an acute cause of the patient's pain today.  He tolerated the Zometa infusion without any adverse effects noted.  Recommended the patient follow-up with oncology and primary care for further pain management.        Final Clinical Impression(s) / ED Diagnoses Final diagnoses:  Generalized abdominal pain  Nausea and vomiting, unspecified vomiting type  Malignancy [facility])    Rx / DC Orders ED Discharge Orders     None         Ronny Bacon 02/12/22 1826    Margette Fast, MD 02/13/22 [phone]

## 2022-02-14 ENCOUNTER — Other Ambulatory Visit: Payer: Self-pay | Admitting: Oncology

## 2022-02-14 ENCOUNTER — Telehealth: Payer: Self-pay

## 2022-02-14 ENCOUNTER — Other Ambulatory Visit: Payer: Self-pay

## 2022-02-14 ENCOUNTER — Inpatient Hospital Stay: Payer: Medicaid Other

## 2022-02-14 ENCOUNTER — Telehealth: Payer: Self-pay | Admitting: Oncology

## 2022-02-14 VITALS — BP 126/82 | HR 84 | Temp 98.4°F | Resp 16

## 2022-02-14 DIAGNOSIS — C641 Malignant neoplasm of right kidney, except renal pelvis: Secondary | ICD-10-CM

## 2022-02-14 MED ORDER — ONDANSETRON HCL 4 MG/2ML IJ SOLN
4.0000 mg | Freq: Once | INTRAMUSCULAR | Status: AC
  Administered 2022-02-14: 4 mg via INTRAVENOUS
  Filled 2022-02-14: qty 2

## 2022-02-14 MED ORDER — MORPHINE SULFATE (PF) 2 MG/ML IV SOLN
2.0000 mg | Freq: Once | INTRAVENOUS | Status: AC
  Administered 2022-02-14: 2 mg via INTRAVENOUS
  Filled 2022-02-14: qty 1

## 2022-02-14 MED ORDER — HYDROMORPHONE HCL 4 MG PO TABS: 4.0000 mg | ORAL_TABLET | ORAL | 0 refills | Status: DC | PRN

## 2022-02-14 MED ORDER — SODIUM CHLORIDE 0.9 % IV SOLN: Freq: Once | INTRAVENOUS | Status: AC

## 2022-02-14 NOTE — Patient Instructions (Signed)
Rehydration, Adult Rehydration is the replacement of body fluids, salts, and minerals (electrolytes) that are lost during dehydration. Dehydration is when there is not enough water or other fluids in the body. This happens when you lose more fluids than you take in. Common causes of dehydration include: Not drinking enough fluids. This can occur when you are ill or doing activities that require a lot of energy, especially in hot weather. Conditions that cause loss of water or other fluids, such as diarrhea, vomiting, sweating, or urinating a lot. Other illnesses, such as fever or infection. Certain medicines, such as those that remove excess fluid from the body (diuretics). Symptoms of mild or moderate dehydration may include thirst, dry lips and mouth, and dizziness. Symptoms of severe dehydration may include increased heart rate, confusion, fainting, and not urinating. For severe dehydration, you may need to get fluids through an IV at the hospital. For mild or moderate dehydration, you can usually rehydrate at home by drinking certain fluids as told by your health care provider. What are the risks? Generally, rehydration is safe. However, taking in too much fluid (overhydration) can be a problem. This is rare. Overhydration can cause an electrolyte imbalance, kidney failure, or a decrease in salt (sodium) levels in the body. Supplies needed You will need an oral rehydration solution (ORS) if your health care provider tells you to use one. This is a drink to treat dehydration. It can be found in pharmacies and retail stores. How to rehydrate Fluids Follow instructions from your health care provider for rehydration. The kind of fluid and the amount you should drink depend on your condition. In general, you should choose drinks that you prefer. If told by your health care provider, drink an ORS. Make an ORS by following instructions on the package. Start by drinking small amounts, about  cup (120  mL) every 5-10 minutes. Slowly increase how much you drink until you have taken the amount recommended by your health care provider. Drink enough clear fluids to keep your urine pale yellow. If you were told to drink an ORS, finish it first, then start slowly drinking other clear fluids. Drink fluids such as: Water. This includes sparkling water and flavored water. Drinking only water can lead to having too little sodium in your body (hyponatremia). Follow the advice of your health care provider. Water from ice chips you suck on. Fruit juice with water you add to it (diluted). Sports drinks. Hot or cold herbal teas. Broth-based soups. Milk or milk products. Food Follow instructions from your health care provider about what to eat while you rehydrate. Your health care provider may recommend that you slowly begin eating regular foods in small amounts. Eat foods that contain a healthy balance of electrolytes, such as bananas, oranges, potatoes, tomatoes, and spinach. Avoid foods that are greasy or contain a lot of sugar. In some cases, you may get nutrition through a feeding tube that is passed through your nose and into your stomach (nasogastric tube, or NG tube). This may be done if you have uncontrolled vomiting or diarrhea. Beverages to avoid  Certain beverages may make dehydration worse. While you rehydrate, avoid drinking alcohol. How to tell if you are recovering from dehydration You may be recovering from dehydration if: You are urinating more often than before you started rehydrating. Your urine is pale yellow. Your energy level improves. You vomit less frequently. You have diarrhea less frequently. Your appetite improves or returns to normal. You feel less dizzy or less light-headed.   Your skin tone and color start to look more normal. Follow these instructions at home: Take over-the-counter and prescription medicines only as told by your health care provider. Do not take sodium  tablets. Doing this can lead to having too much sodium in your body (hypernatremia). Contact a health care provider if: You continue to have symptoms of mild or moderate dehydration, such as: Thirst. Dry lips. Slightly dry mouth. Dizziness. Dark urine or less urine than normal. Muscle cramps. You continue to vomit or have diarrhea. Get help right away if you: Have symptoms of dehydration that get worse. Have a fever. Have a severe headache. Have been vomiting and the following happens: Your vomiting gets worse or does not go away. Your vomit includes blood or green matter (bile). You cannot eat or drink without vomiting. Have problems with urination or bowel movements, such as: Diarrhea that gets worse or does not go away. Blood in your stool (feces). This may cause stool to look black and tarry. Not urinating, or urinating only a small amount of very dark urine, within 6-8 hours. Have trouble breathing. Have symptoms that get worse with treatment. These symptoms may represent a serious problem that is an emergency. Do not wait to see if the symptoms will go away. Get medical help right away. Call your local emergency services (911 in the [place] Do not drive yourself to the hospital. Summary Rehydration is the replacement of body fluids and minerals (electrolytes) that are lost during dehydration. Follow instructions from your health care provider for rehydration. The kind of fluid and amount you should drink depend on your condition. Slowly increase how much you drink until you have taken the amount recommended by your health care provider. Contact your health care provider if you continue to show signs of mild or moderate dehydration. This information is not intended to replace advice given to you by your health care provider. Make sure you discuss any questions you have with your health care provider. Document Revised: 08/25/2019 Document Reviewed: 07/05/2019 Elsevier Patient  Education  2023 Elsevier Inc.  

## 2022-02-14 NOTE — Telephone Encounter (Addendum)
Spoke with pt's SO, [name]. Patient continues to have right sided "abdominal pain" which she relates to his kidney. Patient taking Oxycodone 10 mg tabs q 4 hours as prescribed along with Ondansetron. Oxycodone brings pain down to 8/10.   Patient has started axitinib.  Patient and SO requesting something stronger to manage pain.  Routed to provider.  11:00 - Spoke with patient and SO. They agreed to come in directly to get fluids in [facility] and to address pain issues.

## 2022-02-14 NOTE — Telephone Encounter (Signed)
Scheduled per 8/10 in basket, pt has been called and confirmed

## 2022-02-18 ENCOUNTER — Other Ambulatory Visit: Payer: Self-pay

## 2022-02-18 ENCOUNTER — Inpatient Hospital Stay: Payer: Medicaid Other | Admitting: Oncology

## 2022-02-18 ENCOUNTER — Inpatient Hospital Stay: Payer: Medicaid Other

## 2022-02-18 VITALS — BP 133/85 | HR 111 | Temp 97.5°F | Resp 18 | Wt 131.4 lb

## 2022-02-18 DIAGNOSIS — R918 Other nonspecific abnormal finding of lung field: Secondary | ICD-10-CM

## 2022-02-18 DIAGNOSIS — N2889 Other specified disorders of kidney and ureter: Secondary | ICD-10-CM

## 2022-02-18 DIAGNOSIS — C641 Malignant neoplasm of right kidney, except renal pelvis: Secondary | ICD-10-CM

## 2022-02-18 DIAGNOSIS — J90 Pleural effusion, not elsewhere classified: Secondary | ICD-10-CM

## 2022-02-18 LAB — CMP (CANCER CENTER ONLY)
ALT: <5 U/L (ref 0–44)
AST: 10 U/L — ABNORMAL LOW (ref 15–41)
Albumin: 4.2 g/dL (ref 3.5–5.0)
Alkaline Phosphatase: 69 U/L (ref 38–126)
Anion gap: 10 (ref 5–15)
BUN: 7 mg/dL (ref 6–20)
CO2: 22 mmol/L (ref 22–32)
Calcium: 12.3 mg/dL — ABNORMAL HIGH (ref 8.9–10.3)
Chloride: 98 mmol/L (ref 98–111)
Creatinine: 0.56 mg/dL — ABNORMAL LOW (ref 0.61–1.24)
GFR, Estimated: >60 mL/min (ref >60)
Glucose, Bld: 85 mg/dL (ref 70–99)
Potassium: 3.7 mmol/L (ref 3.5–5.1)
Sodium: 130 mmol/L — ABNORMAL LOW (ref 135–145)
Total Bilirubin: 0.8 mg/dL (ref 0.3–1.2)
Total Protein: 7.9 g/dL (ref 6.5–8.1)

## 2022-02-18 LAB — CBC WITH DIFFERENTIAL (CANCER CENTER ONLY)
Abs Immature Granulocytes: 0.01 10*3/uL (ref 0.00–0.07)
Basophils Absolute: 0 10*3/uL (ref 0.0–0.1)
Basophils Relative: 1 %
Eosinophils Absolute: 0.2 10*3/uL (ref 0.0–0.5)
Eosinophils Relative: 5 %
HCT: 36.1 % — ABNORMAL LOW (ref 39.0–52.0)
Hemoglobin: 11.9 g/dL — ABNORMAL LOW (ref 13.0–17.0)
Immature Granulocytes: 0 %
Lymphocytes Relative: 18 %
Lymphs Abs: 0.7 10*3/uL (ref 0.7–4.0)
MCH: 24.5 pg — ABNORMAL LOW (ref 26.0–34.0)
MCHC: 33 g/dL (ref 30.0–36.0)
MCV: 74.3 fL — ABNORMAL LOW (ref 80.0–100.0)
Monocytes Absolute: 0.7 10*3/uL (ref 0.1–1.0)
Monocytes Relative: 18 %
Neutro Abs: 2.2 10*3/uL (ref 1.7–7.7)
Neutrophils Relative %: 58 %
Platelet Count: 334 10*3/uL (ref 150–400)
RBC: 4.86 MIL/uL (ref 4.22–5.81)
RDW: 16.1 % — ABNORMAL HIGH (ref 11.5–15.5)
WBC Count: 3.8 10*3/uL — ABNORMAL LOW (ref 4.0–10.5)
nRBC: 0 % (ref 0.0–0.2)

## 2022-02-18 NOTE — Progress Notes (Signed)
Hematology and Oncology Follow Up Visit  Dylan Good [id number] 11-Jun-1971 51 y.o. 02/18/2022 8:34 AM McVey, Gelene Mink, PA-CMcVey, Letta Median*   Principle Diagnosis: 51 year old man with kidney cancer diagnosed in June 2022.  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   Prior Therapy:   He is status post thoracentesis completed in June 2022 and repeated on January 02, 2021.  He is s/p Pleurx catheter insertion in August 2022.  He is status post stereotactic radiosurgery to the brain completed on January 29, 2021.  He received 20 Gray in 1 fraction.  He is status post kidney biopsy completed on March 06, 2021.  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  Ipilimumab 1 mg/kg with nivolumab 3 mg/kg started on January 15, 2021.  He completed 4 cycles of therapy.  Nivolumab 480 mg every 4 weeks to start on April 10, 2021.  Therapy discontinued due to progression of disease.  Cabometyx 40 mg started on May 11, 2021.  Therapy interrupted during frequent hospitalization between December and March 2023.  Therapy discontinued in April 2023.  Current therapy: Inlyta 5 mg daily started February 14, 2022.  Interim History: Dylan Good returns today for a follow-up.  Since the last visit he started [place] on February 14, 2022.  He since that time seen in the emergency department on August 8 and received intravenous hydration and Zometa for hypercalcemia.  He was also received intravenous fluids on August 10.  Over the weekend, he reports feeling better without any major complaints.  He denies any nausea, vomiting or abdominal pain.  His pain is manageable with Dilaudid versus oxycodone. His appetite has been overall poor but still able to eat and hydrate.  Medications: Updated on review.     Allergies: No Known Allergies    Physical Exam:     Blood pressure 133/85, pulse (!) 111, temperature (!) 97.5 F (36.4 C),  temperature source Temporal, resp. rate 18, weight 131 lb 7 oz (59.6 kg).     ECOG: 2   General appearance: Alert, awake without any distress. Head: Atraumatic without abnormalities Oropharynx: Without any thrush or ulcers. Eyes: No scleral icterus. Lymph nodes: No lymphadenopathy noted in the cervical, supraclavicular, or axillary nodes Heart:regular rate and rhythm, without any murmurs or gallops.   Lung: Clear to auscultation without any rhonchi, wheezes or dullness to percussion. Abdomin: Soft, nontender without any shifting dullness or ascites. Musculoskeletal: No clubbing or cyanosis. Neurological: No motor or sensory deficits. Skin: No rashes or lesions.       Lab Results: Lab Results  Component Value Date   WBC 4.2 02/12/2022   HGB 11.2 (L) 02/12/2022   HCT 36.4 (L) 02/12/2022   MCV 78.4 (L) 02/12/2022   PLT 368 02/12/2022     Chemistry      Component Value Date/Time   NA 137 02/12/2022 1305   K 4.3 02/12/2022 1305   CL 105 02/12/2022 1305   CO2 23 02/12/2022 1305   BUN 10 02/12/2022 1305   CREATININE 0.73 02/12/2022 1305   CREATININE 0.61 11/02/2021 0936      Component Value Date/Time   CALCIUM 14.9 (HH) 02/12/2022 1305   CALCIUM NOT PERFORMED 11/15/2021 1545   ALKPHOS 55 02/12/2022 1305   AST 16 02/12/2022 1305   AST 8 (L) 11/02/2021 0936   ALT 8 02/12/2022 1305   ALT 5 11/02/2021 0936   BILITOT 1.1 02/12/2022 1305   BILITOT 1.1 11/02/2021  0936       Impression and Plan:   51 year old man with:  1.  Kidney cancer diagnosed in June 2022.  He presented with Sage IV clear-cell tumor including CNS and pulmonary involvement.   He is currently on salvage therapy with axitinib which was recently started.  Risks and benefits of continuing this treatment versus transitioning to hospice continues to be discussed.  Complications associated with this medication including hypertension, GI toxicity among others were reiterated.  He is agreeable to  continue.    2.  Hypercalcemia: He continues to receive intravenous hydration intermittently as well as Zometa.  This is related to malignancy.  His calcium level remains elevated today but he is less symptomatic we will continue to monitor.    3.  Goals of care and prognosis: His prognosis remains overall poor with limited life expectancy.  His performance status is adequate and like to continue with aggressive measures.  He also understands that if this treatment does not work hospice is his best option.  4.  Pain: Manageable with Dilaudid as needed.  Deferred the option for long-acting medication given his occasional somnolence and confusion.  5.  Follow-up: In the next week for IV hydration and in 3 weeks for repeat follow-up.    30  minutes were dedicated to this visit.  Time spent on reviewing laboratory data, disease status update, treatment choices and addressing complication related to cancer and cancer therapy.   Zola Button, MD 8/14/20238:34 AM

## 2022-02-19 ENCOUNTER — Telehealth: Payer: Self-pay | Admitting: Oncology

## 2022-02-19 NOTE — Telephone Encounter (Signed)
Scheduled per 08/14 los, patient has been called and notified.

## 2022-02-22 ENCOUNTER — Telehealth: Payer: Self-pay | Admitting: Oncology

## 2022-02-22 ENCOUNTER — Other Ambulatory Visit (HOSPITAL_COMMUNITY): Payer: Self-pay

## 2022-02-22 NOTE — Telephone Encounter (Signed)
Rescheduled upcoming appointment per patient's request. Patient's significant other is aware of changes.

## 2022-02-25 ENCOUNTER — Other Ambulatory Visit (HOSPITAL_COMMUNITY): Payer: Self-pay

## 2022-02-27 ENCOUNTER — Telehealth: Payer: Self-pay | Admitting: *Deleted

## 2022-02-27 ENCOUNTER — Inpatient Hospital Stay: Payer: Medicaid Other

## 2022-02-27 ENCOUNTER — Encounter (HOSPITAL_COMMUNITY): Payer: Self-pay | Admitting: Emergency Medicine

## 2022-02-27 ENCOUNTER — Other Ambulatory Visit: Payer: Self-pay

## 2022-02-27 ENCOUNTER — Inpatient Hospital Stay (HOSPITAL_COMMUNITY)
Admission: EM | Admit: 2022-02-27 | Discharge: 2022-03-03 | DRG: 640 | Disposition: A | Discharge status: Home / Self Care | Payer: Medicaid Other | Source: Ambulatory Visit | Attending: Internal Medicine | Admitting: Internal Medicine

## 2022-02-27 ENCOUNTER — Other Ambulatory Visit: Payer: Self-pay | Admitting: *Deleted

## 2022-02-27 ENCOUNTER — Other Ambulatory Visit (HOSPITAL_COMMUNITY): Payer: Self-pay

## 2022-02-27 DIAGNOSIS — N2889 Other specified disorders of kidney and ureter: Secondary | ICD-10-CM

## 2022-02-27 DIAGNOSIS — Z79899 Other long term (current) drug therapy: Secondary | ICD-10-CM

## 2022-02-27 DIAGNOSIS — C7800 Secondary malignant neoplasm of unspecified lung: Secondary | ICD-10-CM | POA: Diagnosis present

## 2022-02-27 DIAGNOSIS — C642 Malignant neoplasm of left kidney, except renal pelvis: Secondary | ICD-10-CM | POA: Diagnosis present

## 2022-02-27 DIAGNOSIS — C7931 Secondary malignant neoplasm of brain: Secondary | ICD-10-CM | POA: Diagnosis present

## 2022-02-27 DIAGNOSIS — C641 Malignant neoplasm of right kidney, except renal pelvis: Secondary | ICD-10-CM

## 2022-02-27 DIAGNOSIS — E43 Unspecified severe protein-calorie malnutrition: Secondary | ICD-10-CM | POA: Diagnosis present

## 2022-02-27 DIAGNOSIS — Z681 Body mass index (BMI) 19 or less, adult: Secondary | ICD-10-CM

## 2022-02-27 DIAGNOSIS — C649 Malignant neoplasm of unspecified kidney, except renal pelvis: Secondary | ICD-10-CM | POA: Diagnosis present

## 2022-02-27 DIAGNOSIS — E871 Hypo-osmolality and hyponatremia: Secondary | ICD-10-CM | POA: Diagnosis present

## 2022-02-27 DIAGNOSIS — Z7951 Long term (current) use of inhaled steroids: Secondary | ICD-10-CM

## 2022-02-27 DIAGNOSIS — G8929 Other chronic pain: Secondary | ICD-10-CM | POA: Diagnosis present

## 2022-02-27 DIAGNOSIS — R918 Other nonspecific abnormal finding of lung field: Secondary | ICD-10-CM

## 2022-02-27 DIAGNOSIS — E876 Hypokalemia: Secondary | ICD-10-CM | POA: Diagnosis not present

## 2022-02-27 DIAGNOSIS — J90 Pleural effusion, not elsewhere classified: Secondary | ICD-10-CM

## 2022-02-27 DIAGNOSIS — J9601 Acute respiratory failure with hypoxia: Secondary | ICD-10-CM

## 2022-02-27 DIAGNOSIS — Z87891 Personal history of nicotine dependence: Secondary | ICD-10-CM

## 2022-02-27 DIAGNOSIS — R52 Pain, unspecified: Secondary | ICD-10-CM

## 2022-02-27 DIAGNOSIS — E860 Dehydration: Secondary | ICD-10-CM | POA: Diagnosis present

## 2022-02-27 DIAGNOSIS — L89150 Pressure ulcer of sacral region, unstageable: Secondary | ICD-10-CM | POA: Diagnosis present

## 2022-02-27 DIAGNOSIS — I10 Essential (primary) hypertension: Secondary | ICD-10-CM | POA: Diagnosis present

## 2022-02-27 DIAGNOSIS — Z66 Do not resuscitate: Secondary | ICD-10-CM | POA: Diagnosis present

## 2022-02-27 LAB — CBC WITH DIFFERENTIAL (CANCER CENTER ONLY)
Abs Immature Granulocytes: 0.01 10*3/uL (ref 0.00–0.07)
Basophils Absolute: 0 10*3/uL (ref 0.0–0.1)
Basophils Relative: 1 %
Eosinophils Absolute: 0.2 10*3/uL (ref 0.0–0.5)
Eosinophils Relative: 3 %
HCT: 34.1 % — ABNORMAL LOW (ref 39.0–52.0)
Hemoglobin: 11.1 g/dL — ABNORMAL LOW (ref 13.0–17.0)
Immature Granulocytes: 0 %
Lymphocytes Relative: 20 %
Lymphs Abs: 0.9 10*3/uL (ref 0.7–4.0)
MCH: 24.3 pg — ABNORMAL LOW (ref 26.0–34.0)
MCHC: 32.6 g/dL (ref 30.0–36.0)
MCV: 74.8 fL — ABNORMAL LOW (ref 80.0–100.0)
Monocytes Absolute: 0.8 10*3/uL (ref 0.1–1.0)
Monocytes Relative: 19 %
Neutro Abs: 2.6 10*3/uL (ref 1.7–7.7)
Neutrophils Relative %: 57 %
Platelet Count: 357 10*3/uL (ref 150–400)
RBC: 4.56 MIL/uL (ref 4.22–5.81)
RDW: 16.2 % — ABNORMAL HIGH (ref 11.5–15.5)
WBC Count: 4.5 10*3/uL (ref 4.0–10.5)
nRBC: 0 % (ref 0.0–0.2)

## 2022-02-27 LAB — CMP (CANCER CENTER ONLY)
ALT: 6 U/L (ref 0–44)
AST: 20 U/L (ref 15–41)
Albumin: 4 g/dL (ref 3.5–5.0)
Alkaline Phosphatase: 56 U/L (ref 38–126)
Anion gap: 13 (ref 5–15)
BUN: 13 mg/dL (ref 6–20)
CO2: 22 mmol/L (ref 22–32)
Calcium: >15 mg/dL (ref 8.9–10.3)
Chloride: 96 mmol/L — ABNORMAL LOW (ref 98–111)
Creatinine: 0.75 mg/dL (ref 0.61–1.24)
GFR, Estimated: >60 mL/min (ref >60)
Glucose, Bld: 69 mg/dL — ABNORMAL LOW (ref 70–99)
Potassium: 4.4 mmol/L (ref 3.5–5.1)
Sodium: 131 mmol/L — ABNORMAL LOW (ref 135–145)
Total Bilirubin: 1 mg/dL (ref 0.3–1.2)
Total Protein: 8.2 g/dL — ABNORMAL HIGH (ref 6.5–8.1)

## 2022-02-27 LAB — LACTIC ACID, PLASMA
Lactic Acid, Venous: 2.3 mmol/L (ref 0.5–1.9)
Lactic Acid, Venous: 2.9 mmol/L (ref 0.5–1.9)

## 2022-02-27 MED ORDER — ONDANSETRON HCL 4 MG/2ML IJ SOLN
4.0000 mg | Freq: Four times a day (QID) | INTRAMUSCULAR | Status: DC | PRN
  Administered 2022-03-01 – 2022-03-02 (×2): 4 mg via INTRAVENOUS
  Filled 2022-02-27 (×3): qty 2

## 2022-02-27 MED ORDER — ZOLEDRONIC ACID 4 MG/5ML IV CONC
4.0000 mg | Freq: Once | INTRAVENOUS | Status: AC
  Administered 2022-02-27: 4 mg via INTRAVENOUS
  Filled 2022-02-27: qty 5

## 2022-02-27 MED ORDER — ALBUTEROL SULFATE (2.5 MG/3ML) 0.083% IN NEBU: 2.5000 mg | INHALATION_SOLUTION | Freq: Four times a day (QID) | RESPIRATORY_TRACT | Status: DC | PRN

## 2022-02-27 MED ORDER — HEPARIN SODIUM (PORCINE) 5000 UNIT/ML IJ SOLN
5000.0000 [IU] | Freq: Three times a day (TID) | INTRAMUSCULAR | Status: DC
  Administered 2022-02-27 – 2022-03-03 (×12): 5000 [IU] via SUBCUTANEOUS
  Filled 2022-02-27 (×13): qty 1

## 2022-02-27 MED ORDER — ONDANSETRON HCL 4 MG PO TABS: 4.0000 mg | ORAL_TABLET | Freq: Four times a day (QID) | ORAL | Status: DC | PRN

## 2022-02-27 MED ORDER — CALCITONIN (SALMON) 200 UNIT/ML IJ SOLN
200.0000 [IU] | Freq: Two times a day (BID) | INTRAMUSCULAR | Status: DC
  Administered 2022-02-27 – 2022-02-28 (×3): 200 [IU] via SUBCUTANEOUS
  Filled 2022-02-27 (×6): qty 1

## 2022-02-27 MED ORDER — MOMETASONE FURO-FORMOTEROL FUM 100-5 MCG/ACT IN AERO
2.0000 | INHALATION_SPRAY | Freq: Two times a day (BID) | RESPIRATORY_TRACT | Status: DC
  Administered 2022-02-28 – 2022-03-03 (×6): 2 via RESPIRATORY_TRACT
  Filled 2022-02-27: qty 8.8

## 2022-02-27 MED ORDER — AMLODIPINE BESYLATE 5 MG PO TABS
5.0000 mg | ORAL_TABLET | Freq: Every day | ORAL | Status: DC
Start: 2022-02-28 — End: 2022-02-28

## 2022-02-27 MED ORDER — ALBUTEROL SULFATE HFA 108 (90 BASE) MCG/ACT IN AERS: 2.0000 | INHALATION_SPRAY | RESPIRATORY_TRACT | Status: DC | PRN

## 2022-02-27 MED ORDER — SODIUM CHLORIDE 0.9 % IV SOLN
INTRAVENOUS | Status: DC
Start: 2022-02-27 — End: 2022-03-01

## 2022-02-27 MED ORDER — ONDANSETRON HCL 4 MG/2ML IJ SOLN
4.0000 mg | Freq: Once | INTRAMUSCULAR | Status: AC
  Administered 2022-02-27: 4 mg via INTRAVENOUS
  Filled 2022-02-27: qty 2

## 2022-02-27 MED ORDER — HYDROMORPHONE HCL 4 MG PO TABS
4.0000 mg | ORAL_TABLET | ORAL | Status: DC | PRN
  Administered 2022-03-01 – 2022-03-03 (×3): 4 mg via ORAL
  Filled 2022-02-27 (×4): qty 1

## 2022-02-27 MED ORDER — ACETAMINOPHEN 325 MG PO TABS: 650.0000 mg | ORAL_TABLET | Freq: Four times a day (QID) | ORAL | Status: DC | PRN

## 2022-02-27 MED ORDER — ACETAMINOPHEN 650 MG RE SUPP: 650.0000 mg | Freq: Four times a day (QID) | RECTAL | Status: DC | PRN

## 2022-02-27 MED ORDER — SODIUM CHLORIDE 0.9 % IV SOLN: INTRAVENOUS | Status: DC

## 2022-02-27 MED ORDER — HYDROMORPHONE HCL 2 MG/ML IJ SOLN
1.0000 mg | Freq: Once | INTRAMUSCULAR | Status: AC
  Administered 2022-02-27: 1 mg via INTRAVENOUS
  Filled 2022-02-27: qty 1

## 2022-02-27 MED ORDER — OXYCODONE HCL 5 MG PO TABS
10.0000 mg | ORAL_TABLET | ORAL | Status: DC | PRN
  Administered 2022-02-28 – 2022-03-01 (×4): 10 mg via ORAL
  Filled 2022-02-27 (×4): qty 2

## 2022-02-27 NOTE — Progress Notes (Signed)
Pt arrived c/o chronic abdominal pain 7/10, and occasional n/v throughout the week despite use of antiemetics and pain medications. VSS. Fransisco Hertz RN informed of critical Calcium value. 20G IV secured in R arm by Tammi Sou RN. Pt transferred to ED by Ellouise Newer.   Family informed of transfer to ED and medical condition.

## 2022-02-27 NOTE — Telephone Encounter (Signed)
Patient transported to ED by [facility] per Dr. Worthy Flank recommendation due to hypercalcemia.  IV established, NS infusing.  Bedside report given to Burman Nieves, RN.

## 2022-02-27 NOTE — H&P (Signed)
History and Physical    Dylan Good BLT:[id number] DOB: February 24, 1971 DOA: 02/27/2022  I have briefly reviewed the patient's prior medical records in [place]  PCP: McVey, Gelene Mink, PA-C  Patient coming from: oncology clinic  Chief Complaint: abnormal labs  HPI: Dylan Good is a 51 y.o. 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  He presents to the emergency department for evaluation of weakness and hypercalcemia.  During labs drawn today, calcium found to be greater than 15.0.  Patient presented to the cancer to center today for IV hydration.  Last seen in the emergency department on 02/12/22 for calcium 14.9, was treated with IV fluids and Zometa, discharged home at that time after discussion with oncology.  Patient complains of feeling generally weak.  He has had poor PO intake.   In the ER, patient was given IVF,  calcitonin, and IV zometa.  Patient has chronic pain and feels like currently pain is at baseline.  He is also weak and that me slightly worse than normal.  Does not eat much but does drink 4 bottles of water/day.   Review of Systems: As per HPI otherwise 10 point review of systems negative.   Past Medical History:  Diagnosis Date   Cancer, metastatic to lung [facility])    Hypertension 07/01/2021   Renal cell adenocarcinoma, left [facility])     Past Surgical History:  Procedure Laterality Date   APPENDECTOMY     BIOPSY  07/03/2021   Procedure: BIOPSY;  Surgeon: Ronnette Juniper, MD;  Location: [facility];  Service: Gastroenterology;;   CHEST TUBE INSERTION Left 02/06/2021   Procedure: INSERTION PLEURAL DRAINAGE CATHETER;  Surgeon: Garner Nash, DO;  Location: [place];  Service: Pulmonary;  Laterality: Left;   CHEST TUBE INSERTION N/A 04/04/2021   Procedure: removal of pleurex cath;  Surgeon: Garner Nash, DO;  Location: [place];  Service: Pulmonary;  Laterality: N/A;    ESOPHAGOGASTRODUODENOSCOPY (EGD) WITH PROPOFOL N/A 07/03/2021   Procedure: ESOPHAGOGASTRODUODENOSCOPY (EGD) WITH PROPOFOL;  Surgeon: Ronnette Juniper, MD;  Location: [facility];  Service: Gastroenterology;  Laterality: N/A;     reports that he has quit smoking. His smoking use included cigarettes. He smoked an average of .2 packs per day. He has never used smokeless tobacco. He reports that he does not drink alcohol and does not use drugs.  No Known Allergies  Family History  Problem Relation Age of Onset   Lupus Mother    Cancer Other     Prior to Admission medications   Medication Sig Start Date End Date Taking? Authorizing Provider  acetaminophen (TYLENOL) 325 MG tablet Take 2 tablets (650 mg total) by mouth every 6 (six) hours as needed for mild pain (or Fever >/= 101). 10/07/21  Yes Arrien, [name], MD  albuterol (PROVENTIL) (2.5 MG/3ML) 0.083% nebulizer solution Take 2.5 mg by nebulization every 6 (six) hours as needed for wheezing or shortness of breath.   Yes [provider]  amLODipine (NORVASC) 5 MG tablet TAKE 1 TABLET (5 MG TOTAL) BY MOUTH DAILY. 07/17/21  Yes [name], MD  axitinib (INLYTA) 5 MG tablet Take 1 tablet (5 mg total) by mouth daily. 02/08/22  Yes [name], MD  feeding supplement (ENSURE ENLIVE / ENSURE PLUS) LIQD Take 237 mLs by mouth 2 (two) times daily between meals. Patient taking differently: Take 237 mLs by mouth 2 (two) times daily as needed (nutrition). 01/26/22  Yes Swayze, Ava, DO  HYDROmorphone (DILAUDID) 4 MG tablet Take 1 tablet (4 mg total) by mouth every 4 (four) hours as needed for severe pain. 02/14/22  Yes [name], MD  metoprolol tartrate (LOPRESSOR) 25 MG tablet Take 1/2 tablet (12.5 mg total) by mouth 2 (two) times daily. Patient taking differently: Take 25 mg by mouth daily. 01/25/22  Yes Swayze, Ava, DO  ondansetron (ZOFRAN) 4 MG tablet Take 1 tablet (4 mg total) by mouth every 6 (six) hours as needed for nausea.  09/16/21  Yes [name], MD  Oxycodone HCl 10 MG TABS Take 10 mg by mouth every 4 (four) hours as needed (pain). 01/07/22  Yes [provider]  promethazine (PHENERGAN) 25 MG suppository Place 1 suppository (25 mg total) rectally every 8 (eight) hours as needed for nausea or vomiting. 02/02/22  Yes Trifan, [name], MD  promethazine (PHENERGAN) 25 MG tablet Take 1 tablet (25 mg total) by mouth every 8 (eight) hours as needed for up to 12 doses for nausea or vomiting. 02/02/22  Yes Trifan, [name], MD  SYMBICORT 80-4.5 MCG/ACT inhaler Inhale 2 puffs into the lungs daily as needed (for shortness of breath). 11/12/21  Yes [provider]  VENTOLIN HFA 108 (90 Base) MCG/ACT inhaler Inhale 2 puffs into the lungs every 4 (four) hours as needed for shortness of breath or wheezing. 09/11/21  Yes [provider]  B Complex-C (B-COMPLEX WITH VITAMIN C) tablet Take 1 tablet by mouth daily. Patient not taking: Reported on 02/27/2022 01/25/22   Swayze, Ava, DO  lidocaine (LIDODERM) 5 % Place 2 patches onto the skin daily. Remove & Discard patch within 12 hours or as directed by MD Patient not taking: Reported on 02/27/2022 01/26/22   Swayze, Ava, DO  Multiple Vitamin (MULTIVITAMIN WITH MINERALS) TABS tablet Take 1 tablet by mouth daily. Patient not taking: Reported on 02/27/2022 01/26/22   Karie Kirks, DO    Physical Exam: Vitals:   02/27/22 1526 02/27/22 1530 02/27/22 1600 02/27/22 1630  BP: 130/84 135/88 129/75 118/68  Pulse: 89 89 93 94  Resp: 20 13 20 19   SpO2: 100% 100% 100% 100%  Weight:      Height:          Constitutional: NAD, calm, comfortable, weak appearing Eyes: PERRL, lids and conjunctivae normal ENMT: dry mucous membranes Neck: normal, supple, no masses, no thyromegaly Respiratory: clear to auscultation bilaterally, no wheezing. respiratory effort. No accessory muscle use.  Cardiovascular: Regular rate and rhythm, no murmurs / rubs / gallops. No extremity edema.  2+ pedal pulses.  Abdomen: no tenderness, no masses palpated. Bowel sounds positive.  Musculoskeletal: no clubbing / cyanosis. Normal muscle tone.  Skin: no rashes, lesions, ulcers. No induration   Labs on Admission: I have personally reviewed following labs and imaging studies  CBC: Recent Labs  Lab 02/27/22 1342  WBC 4.5  NEUTROABS 2.6  HGB 11.1*  HCT 34.1*  MCV 74.8*  PLT 742   Basic Metabolic Panel: Recent Labs  Lab 02/27/22 1342  NA 131*  K 4.4  CL 96*  CO2 22  GLUCOSE 69*  BUN 13  CREATININE 0.75  CALCIUM >15.0*   GFR: Estimated Creatinine Clearance: 94.8 mL/min (by C-G formula based on SCr of 0.75 mg/dL). Liver Function Tests: Recent Labs  Lab 02/27/22 1342  AST 20  ALT 6  ALKPHOS 56  BILITOT 1.0  PROT 8.2*  ALBUMIN 4.0   No results for input(s): "LIPASE", "AMYLASE" in the  last 168 hours. No results for input(s): "AMMONIA" in the last 168 hours. Coagulation Profile: No results for input(s): "INR", "PROTIME" in the last 168 hours. Cardiac Enzymes: No results for input(s): "CKTOTAL", "CKMB", "CKMBINDEX", "TROPONINI" in the last 168 hours. BNP (last 3 results) No results for input(s): "PROBNP" in the last 8760 hours. HbA1C: No results for input(s): "HGBA1C" in the last 72 hours. CBG: No results for input(s): "GLUCAP" in the last 168 hours. Lipid Profile: No results for input(s): "CHOL", "HDL", "LDLCALC", "TRIG", "CHOLHDL", "LDLDIRECT" in the last 72 hours. Thyroid Function Tests: No results for input(s): "TSH", "T4TOTAL", "FREET4", "T3FREE", "THYROIDAB" in the last 72 hours. Anemia Panel: No results for input(s): "VITAMINB12", "FOLATE", "FERRITIN", "TIBC", "IRON", "RETICCTPCT" in the last 72 hours. Urine analysis:    Component Value Date/Time   COLORURINE YELLOW 02/12/2022 1744   APPEARANCEUR HAZY (A) 02/12/2022 1744   LABSPEC 1.014 02/12/2022 1744   PHURINE 6.0 02/12/2022 1744   GLUCOSEU NEGATIVE 02/12/2022 1744   HGBUR MODERATE (A)  02/12/2022 1744   BILIRUBINUR NEGATIVE 02/12/2022 1744   KETONESUR 80 (A) 02/12/2022 1744   PROTEINUR 30 (A) 02/12/2022 1744   NITRITE NEGATIVE 02/12/2022 1744   LEUKOCYTESUR MODERATE (A) 02/12/2022 1744     Radiological Exams on Admission: No results found.    Assessment/Plan Principal Problem:   Hypercalcemia Active Problems:   Kidney cancer, primary, with metastasis from kidney to other site [facility])   * Hypercalcemia of malignancy Patient has received a dose of IV Zometa in the ER. Also currently receiving IV fluid. Given a dose of calcitonin. -added [name] to treatment team  -BMP in AM   Kidney cancer, primary, with metastasis from kidney to other site [facility]) With brain and lung metastasis. -added oncology to treatment team -poor overall prognosis   Hyponatremia -prob poor PO intake -IVF -recheck in AM  Hypertension Patient is on Norvasc.  PT/OT eval   DVT prophylaxis: heparin  Code Status: dnr (discussed at length with patient, RN at bedside) Family Communication: none at bedside Disposition Plan: obs Consults called: added hematology    Admission status: obs   At the point of initial evaluation, it is my clinical opinion that admission for OBSERVATION is reasonable and necessary because the patient's presenting complaints in the context of their chronic conditions represent sufficient risk of deterioration or significant morbidity to constitute reasonable grounds for close observation in the hospital setting, but that the patient may be medically stable for discharge from the hospital within 24 to 48 hours.      Geradine Girt [facility]   How to contact the [facility] Attending or Consulting provider [place] or covering provider during after hours [employer], for this patient?  Check the care team in [facility] and look for a) attending/consulting [facility] provider listed and b) the [facility] team listed Log into [email or web address] and use [facility]'s universal  password to access. If you do not have the password, please contact the hospital operator. Locate the [facility] provider you are looking for under [facility] and page to a number that you can be directly reached. If you still have difficulty reaching the provider, please page the [facility] (Director on Call) for the Hospitalists listed on amion for assistance.  02/27/2022, 5:26 PM

## 2022-02-27 NOTE — ED Triage Notes (Signed)
Pt brought over from cancer center with reports of calcium >15. Pt was there for fluids, hx of kidney cancer.

## 2022-02-27 NOTE — Progress Notes (Unsigned)
CRITICAL VALUE  CALCIUM: >15.0  CALLED TO, READ BACK, AND VERIFIED WITH LOREN CHILCOTT, RN @ 5110 ON 08.23.2023 BY Lawana Pai, MLS

## 2022-02-27 NOTE — Progress Notes (Signed)
CRITICAL VALUE STICKER  CRITICAL VALUE: Ca >15  RECEIVER (on-site recipient of call): L. Chilcott RN  DATE & TIME NOTIFIED: 02/27/22 @ 1436  MESSENGER (representative from lab):  MD NOTIFIED: Dr. Julien Nordmann  TIME OF NOTIFICATION:1438  RESPONSE:  Patient to be evaluated in ED

## 2022-02-27 NOTE — Progress Notes (Signed)
CRITICAL VALUE STICKER  CRITICAL VALUE: Calcium >15  RECEIVER (on-site recipient of call): Shearon Balo, RN   DATE & TIME NOTIFIED: 02/27/22 @ 1434  MESSENGER (representative from lab): Lauren   MD NOTIFIED: Notified Fransisco Hertz, RN with Dr Alen Blew.    TIME OF NOTIFICATION:1435  RESPONSE:  Will let provider know.

## 2022-02-27 NOTE — ED Provider Notes (Signed)
[place] DEPT Provider Note   CSN: [id number] Arrival date & time: 02/27/22  1516     History  Chief Complaint  Patient presents with   Abnormal Lab    Dylan Good is a 51 y.o. male.  Patient with history of kidney cancer diagnosed in June 2022, was found to have stage IV clear-cell with pulmonary and CNS involvement --presents to the emergency department for evaluation of weakness and hypercalcemia.  During labs drawn today, calcium found to be greater than 15.0.  Patient presented to the cancer to center today for IV hydration.  Last seen in the emergency department on 02/12/22 for calcium 14.9, was treated with IV fluids and Zometa, discharged home at that time after discussion with oncology.  Patient complains of feeling generally weak.  He has had intermittent vomiting, last episode 2 nights ago. The onset of this condition was acute. The course is constant. Aggravating factors: none. Alleviating factors: none.        Home Medications Prior to Admission medications   Medication Sig Start Date End Date Taking? Authorizing Provider  acetaminophen (TYLENOL) 325 MG tablet Take 2 tablets (650 mg total) by mouth every 6 (six) hours as needed for mild pain (or Fever >/= 101). 10/07/21   Arrien, [name], MD  albuterol (PROVENTIL) (2.5 MG/3ML) 0.083% nebulizer solution Take 2.5 mg by nebulization every 6 (six) hours as needed for wheezing or shortness of breath.    [provider]  amLODipine (NORVASC) 5 MG tablet TAKE 1 TABLET (5 MG TOTAL) BY MOUTH DAILY. 07/17/21   [name], MD  axitinib (INLYTA) 5 MG tablet Take 1 tablet (5 mg total) by mouth daily. 02/08/22   [name], MD  B Complex-C (B-COMPLEX WITH VITAMIN C) tablet Take 1 tablet by mouth daily. 01/25/22   Swayze, Ava, DO  feeding supplement (ENSURE ENLIVE / ENSURE PLUS) LIQD Take 237 mLs by mouth 2 (two) times daily between meals. 01/26/22   Swayze, Ava, DO  HYDROmorphone  (DILAUDID) 4 MG tablet Take 1 tablet (4 mg total) by mouth every 4 (four) hours as needed for severe pain. 02/14/22   [name], MD  lidocaine (LIDODERM) 5 % Place 2 patches onto the skin daily. Remove & Discard patch within 12 hours or as directed by MD 01/26/22   Swayze, Ava, DO  metoprolol tartrate (LOPRESSOR) 25 MG tablet Take 1/2 tablet (12.5 mg total) by mouth 2 (two) times daily. 01/25/22   Swayze, Ava, DO  Multiple Vitamin (MULTIVITAMIN WITH MINERALS) TABS tablet Take 1 tablet by mouth daily. 01/26/22   Swayze, Ava, DO  ondansetron (ZOFRAN) 4 MG tablet Take 1 tablet (4 mg total) by mouth every 6 (six) hours as needed for nausea. 09/16/21   [name], MD  Oxycodone HCl 10 MG TABS Take 10 mg by mouth every 4 (four) hours as needed. 01/07/22   [provider]  prochlorperazine (COMPAZINE) 5 MG tablet Take 5 mg by mouth in the morning, at noon, and at bedtime. 01/15/22   [provider]  promethazine (PHENERGAN) 25 MG suppository Place 1 suppository (25 mg total) rectally every 8 (eight) hours as needed for nausea or vomiting. 02/02/22   Trifan, [name], MD  promethazine (PHENERGAN) 25 MG tablet Take 1 tablet (25 mg total) by mouth every 8 (eight) hours as needed for up to 12 doses for nausea or vomiting. 02/02/22   Trifan, [name], MD  SYMBICORT 80-4.5 MCG/ACT inhaler Inhale  2 puffs into the lungs daily as needed (for shortness of breath). 11/12/21   [provider]  VENTOLIN HFA 108 (90 Base) MCG/ACT inhaler Inhale 2 puffs into the lungs every 4 (four) hours as needed for shortness of breath or wheezing. 09/11/21   [provider]      Allergies    Patient has no known allergies.    Review of Systems   Review of Systems  Physical Exam Updated Vital Signs BP 130/84   Pulse 89   Resp 20   Ht 5\' 9"  (1.753 m)   Wt 60 kg   SpO2 100%   BMI 19.53 kg/m   Physical Exam Vitals and nursing note reviewed.  Constitutional:      General: He is not in  acute distress.    Appearance: He is well-developed.  HENT:     Head: Normocephalic and atraumatic.     Mouth/Throat:     Mouth: Mucous membranes are dry.  Eyes:     General:        Right eye: No discharge.        Left eye: No discharge.     Conjunctiva/sclera: Conjunctivae normal.  Cardiovascular:     Rate and Rhythm: Normal rate and regular rhythm.     Heart sounds: Normal heart sounds.  Pulmonary:     Effort: Pulmonary effort is normal.     Breath sounds: Normal breath sounds.  Abdominal:     Palpations: Abdomen is soft.     Tenderness: There is no abdominal tenderness.  Musculoskeletal:        General: Normal range of motion.     Cervical back: Normal range of motion and neck supple.  Skin:    General: Skin is warm and dry.  Neurological:     General: No focal deficit present.     Mental Status: He is alert and oriented to person, place, and time.     Motor: No weakness.     Comments: No focal upper or lower extremity weakness.     ED Results / Procedures / Treatments   Labs (all labs ordered are listed, but only abnormal results are displayed) Labs Reviewed  LACTIC ACID, PLASMA - Abnormal; Notable for the following components:      Result Value   Lactic Acid, Venous 2.3 (*)    All other components within normal limits  LACTIC ACID, PLASMA  LIPASE, BLOOD    ED ECG REPORT   Date: 02/27/2022  Rate: 93  Rhythm: normal sinus rhythm  QRS Axis: normal  Intervals: normal  ST/T Wave abnormalities: normal  Conduction Disutrbances:none  Narrative Interpretation:   Old EKG Reviewed: unchanged from 02/12/2022  I have personally reviewed the EKG tracing and agree with the computerized printout as noted.   Radiology No results found.  Procedures Procedures    Medications Ordered in ED Medications  calcitonin (MIACALCIN) injection 200 Units (200 Units Subcutaneous Given 02/27/22 1712)  zoledronic acid (ZOMETA) 4 mg in sodium chloride 0.9 % 100 mL IVPB (0 mg  Intravenous Stopped 02/27/22 1624)  HYDROmorphone (DILAUDID) injection 1 mg (1 mg Intravenous Given 02/27/22 1710)  ondansetron (ZOFRAN) injection 4 mg (4 mg Intravenous Given 02/27/22 1710)    ED Course/ Medical Decision Making/ A&P    Patient seen and examined. History obtained directly from patient. Reviewed recent labs, ED work-up on 02/12/22, recent oncology clinic notes.   Labs/EKG: Ordered lactate 2/2 low temp, however low concern for sepsis.  Also EKG, reviewed  and interpreted as above.  History of pancreatitis and recent vomiting, will check lipase.  Imaging: None ordered.  Reviewed recent CT imaging of the abdomen pelvis.  Medications/Fluids: Continue IV fluids, IV Zometa, SQ calcitonin.  Most recent vital signs reviewed and are as follows: BP 130/84   Pulse 89   Resp 20   Ht 5\' 9"  (1.753 m)   Wt 60 kg   SpO2 100%   BMI 19.53 kg/m   Initial impression: Hypercalcemia, greater than 15.  Discussed patient with Dr. Gilford Raid.  Reviewed EKG with Dr. Gilford Raid.  4:51 PM Reassessment performed. Patient appears stable.  Updated on results.  Agrees to admission.  Lactate noted to be 2.2.  We will continue to monitor and trend.  Again, low concern for sepsis at this time.   Reviewed pertinent lab work and imaging with patient at bedside. Questions answered.   Most current vital signs reviewed and are as follows: BP 129/75   Pulse 93   Resp 20   Ht 5\' 9"  (1.753 m)   Wt 60 kg   SpO2 100%   BMI 19.53 kg/m   Plan: Admit to hospital  I have consulted with Dr. Eliseo Squires of [facility] by telephone who will see patient for admission.  CRITICAL CARE Performed by: Carlisle Cater PA-C Total critical care time: 45 minutes Critical care time was exclusive of separately billable procedures and treating other patients. Critical care was necessary to treat or prevent imminent or life-threatening deterioration. Critical care was time spent personally by me on the following activities:  development of treatment plan with patient and/or surrogate as well as nursing, discussions with consultants, evaluation of patient's response to treatment, examination of patient, obtaining history from patient or surrogate, ordering and performing treatments and interventions, ordering and review of laboratory studies, ordering and review of radiographic studies, pulse oximetry and re-evaluation of patient's condition.                            Medical Decision Making Amount and/or Complexity of Data Reviewed Labs: ordered.  Risk Prescription drug management.   Patient with acute on chronic hypercalcemia in setting of advanced renal cell cancer.  Higher today despite outpatient IV fluids.  EKG without acute changes.  Patient weak but not obtunded.  For this patient's complaint of abdominal pain, the following conditions were considered on the differential diagnosis: gastritis/PUD, enteritis/duodenitis, appendicitis, cholelithiasis/cholecystitis, cholangitis, pancreatitis, ruptured viscus, colitis, diverticulitis, small/large bowel obstruction, proctitis, cystitis, pyelonephritis, ureteral colic, aortic dissection, aortic aneurysm. Atypical chest etiologies were also considered including ACS, PE, and pneumonia.   The patient's vital signs, pertinent lab work and imaging were reviewed and interpreted as discussed in the ED course. Hospitalization was considered for further testing, treatments, or serial exams/observation. However as patient is well-appearing, has a stable exam, and reassuring studies today, I do not feel that they warrant admission at this time. This plan was discussed with the patient who verbalizes agreement and comfort with this plan and seems reliable and able to return to the Emergency Department with worsening or changing symptoms.         Final Clinical Impression(s) / ED Diagnoses Final diagnoses:  Hypercalcemia  Chronic abdominal pain    Rx / DC Orders ED  Discharge Orders     None         Carlisle Cater, PA-C 02/27/22 1718    Isla Pence, MD 02/27/22 1732

## 2022-02-28 ENCOUNTER — Encounter (HOSPITAL_COMMUNITY): Payer: Self-pay | Admitting: Internal Medicine

## 2022-02-28 DIAGNOSIS — Z87891 Personal history of nicotine dependence: Secondary | ICD-10-CM | POA: Diagnosis not present

## 2022-02-28 DIAGNOSIS — E43 Unspecified severe protein-calorie malnutrition: Secondary | ICD-10-CM | POA: Diagnosis present

## 2022-02-28 DIAGNOSIS — I10 Essential (primary) hypertension: Secondary | ICD-10-CM | POA: Diagnosis present

## 2022-02-28 DIAGNOSIS — Z681 Body mass index (BMI) 19 or less, adult: Secondary | ICD-10-CM | POA: Diagnosis not present

## 2022-02-28 DIAGNOSIS — C7931 Secondary malignant neoplasm of brain: Secondary | ICD-10-CM | POA: Diagnosis present

## 2022-02-28 DIAGNOSIS — G8929 Other chronic pain: Secondary | ICD-10-CM | POA: Diagnosis present

## 2022-02-28 DIAGNOSIS — E871 Hypo-osmolality and hyponatremia: Secondary | ICD-10-CM | POA: Diagnosis present

## 2022-02-28 DIAGNOSIS — C7800 Secondary malignant neoplasm of unspecified lung: Secondary | ICD-10-CM | POA: Diagnosis present

## 2022-02-28 DIAGNOSIS — L89150 Pressure ulcer of sacral region, unstageable: Secondary | ICD-10-CM | POA: Diagnosis present

## 2022-02-28 DIAGNOSIS — E860 Dehydration: Secondary | ICD-10-CM | POA: Diagnosis present

## 2022-02-28 DIAGNOSIS — Z66 Do not resuscitate: Secondary | ICD-10-CM | POA: Diagnosis present

## 2022-02-28 DIAGNOSIS — C642 Malignant neoplasm of left kidney, except renal pelvis: Secondary | ICD-10-CM | POA: Diagnosis present

## 2022-02-28 DIAGNOSIS — Z7951 Long term (current) use of inhaled steroids: Secondary | ICD-10-CM | POA: Diagnosis not present

## 2022-02-28 DIAGNOSIS — E876 Hypokalemia: Secondary | ICD-10-CM | POA: Diagnosis not present

## 2022-02-28 DIAGNOSIS — Z79899 Other long term (current) drug therapy: Secondary | ICD-10-CM | POA: Diagnosis not present

## 2022-02-28 LAB — BASIC METABOLIC PANEL
Anion gap: 8 (ref 5–15)
BUN: 10 mg/dL (ref 6–20)
CO2: 24 mmol/L (ref 22–32)
Calcium: 13.4 mg/dL (ref 8.9–10.3)
Chloride: 103 mmol/L (ref 98–111)
Creatinine, Ser: 0.73 mg/dL (ref 0.61–1.24)
GFR, Estimated: >60 mL/min (ref >60)
Glucose, Bld: 100 mg/dL — ABNORMAL HIGH (ref 70–99)
Potassium: 3.9 mmol/L (ref 3.5–5.1)
Sodium: 135 mmol/L (ref 135–145)

## 2022-02-28 LAB — COMPREHENSIVE METABOLIC PANEL
ALT: 6 U/L (ref 0–44)
AST: 21 U/L (ref 15–41)
Albumin: 3.1 g/dL — ABNORMAL LOW (ref 3.5–5.0)
Alkaline Phosphatase: 44 U/L (ref 38–126)
Anion gap: 8 (ref 5–15)
BUN: 7 mg/dL (ref 6–20)
CO2: 18 mmol/L — ABNORMAL LOW (ref 22–32)
Calcium: 12.3 mg/dL — ABNORMAL HIGH (ref 8.9–10.3)
Chloride: 107 mmol/L (ref 98–111)
Creatinine, Ser: 0.65 mg/dL (ref 0.61–1.24)
GFR, Estimated: >60 mL/min (ref >60)
Glucose, Bld: 92 mg/dL (ref 70–99)
Potassium: 4.7 mmol/L (ref 3.5–5.1)
Sodium: 133 mmol/L — ABNORMAL LOW (ref 135–145)
Total Bilirubin: 1.2 mg/dL (ref 0.3–1.2)
Total Protein: 6.6 g/dL (ref 6.5–8.1)

## 2022-02-28 LAB — CBC
HCT: 32.5 % — ABNORMAL LOW (ref 39.0–52.0)
Hemoglobin: 10.3 g/dL — ABNORMAL LOW (ref 13.0–17.0)
MCH: 24.4 pg — ABNORMAL LOW (ref 26.0–34.0)
MCHC: 31.7 g/dL (ref 30.0–36.0)
MCV: 77 fL — ABNORMAL LOW (ref 80.0–100.0)
Platelets: 305 10*3/uL (ref 150–400)
RBC: 4.22 MIL/uL (ref 4.22–5.81)
RDW: 16.1 % — ABNORMAL HIGH (ref 11.5–15.5)
WBC: 3.2 10*3/uL — ABNORMAL LOW (ref 4.0–10.5)
nRBC: 0 % (ref 0.0–0.2)

## 2022-02-28 LAB — LIPASE, BLOOD: Lipase: 20 U/L (ref 11–51)

## 2022-02-28 NOTE — Progress Notes (Signed)
PROGRESS NOTE    Dylan Good  YPP:[id number] DOB: 09/23/1970 DOA: 02/27/2022 PCP: Dorise Hiss, PA-C    Brief Narrative:  Dylan Good is a 51 y.o. 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  He presents to the emergency department for evaluation of weakness and hypercalcemia.  During labs drawn today, calcium found to be greater than 15.0.  Patient presented to the cancer to center today for IV hydration.  Last seen in the emergency department on 02/12/22 for calcium 14.9, was treated with IV fluids and Zometa, discharged home at that time after discussion with oncology.  Patient complains of feeling generally weak.  He has had poor PO intake.   Assessment and Plan:  Hypercalcemia of malignancy Patient has received a dose of IV Zometa in the ER. Also currently receiving IV fluid. Calcitonin x 4 doses -oncology to see -daily labs   Kidney cancer, primary, with metastasis from kidney to other site [facility]) With brain and lung metastasis. -poor overall prognosis   Hyponatremia -prob poor PO intake -IVF -daily labs   Hypertension Patient is on Norvasc-- hold for low BP   PT/OT eval   DVT prophylaxis: heparin injection 5,000 Units Start: 02/27/22 2200    Code Status: DNR Family Communication:   Disposition Plan:  Level of care: Med-Surg Status is: Inpatient Remains inpatient appropriate because: needs correction of calcium    Consultants:  oncology   Subjective: Feeling a little bit better  Objective: Vitals:   02/28/22 0900 02/28/22 0930 02/28/22 1000 02/28/22 1023  BP: (!) 99/58 112/67 110/69   Pulse: 95  (!) 106   Resp: (!) 24 16 18    Temp:    98.8 F (37.1 C)  TempSrc:    Oral  SpO2: 100% 91% 100%   Weight:      Height:        Intake/Output Summary (Last 24 hours) at 02/28/2022 1223 Last data filed at 02/28/2022 0149 Gross per 24 hour  Intake 923.33 ml  Output --  Net  923.33 ml   Filed Weights   02/27/22 1525  Weight: 60 kg    Examination:   General: Appearance:    Thin male in no acute distress     Lungs:     respirations unlabored  Heart:    Tachycardic.    MS:   All extremities are intact.    Neurologic:   Awake, alert       Data Reviewed: I have personally reviewed following labs and imaging studies  CBC: Recent Labs  Lab 02/27/22 1342 02/28/22 0341  WBC 4.5 3.2*  NEUTROABS 2.6  --   HGB 11.1* 10.3*  HCT 34.1* 32.5*  MCV 74.8* 77.0*  PLT 357 458   Basic Metabolic Panel: Recent Labs  Lab 02/27/22 1342 02/28/22 0341  NA 131* 135  K 4.4 3.9  CL 96* 103  CO2 22 24  GLUCOSE 69* 100*  BUN 13 10  CREATININE 0.75 0.73  CALCIUM >15.0* 13.4*   GFR: Estimated Creatinine Clearance: 94.8 mL/min (by C-G formula based on SCr of 0.73 mg/dL). Liver Function Tests: Recent Labs  Lab 02/27/22 1342  AST 20  ALT 6  ALKPHOS 56  BILITOT 1.0  PROT 8.2*  ALBUMIN 4.0   Recent Labs  Lab 02/28/22 0341  LIPASE 20   No results for input(s): "AMMONIA" in the last 168 hours. Coagulation Profile: No results for input(s): "INR", "PROTIME" in the last  168 hours. Cardiac Enzymes: No results for input(s): "CKTOTAL", "CKMB", "CKMBINDEX", "TROPONINI" in the last 168 hours. BNP (last 3 results) No results for input(s): "PROBNP" in the last 8760 hours. HbA1C: No results for input(s): "HGBA1C" in the last 72 hours. CBG: No results for input(s): "GLUCAP" in the last 168 hours. Lipid Profile: No results for input(s): "CHOL", "HDL", "LDLCALC", "TRIG", "CHOLHDL", "LDLDIRECT" in the last 72 hours. Thyroid Function Tests: No results for input(s): "TSH", "T4TOTAL", "FREET4", "T3FREE", "THYROIDAB" in the last 72 hours. Anemia Panel: No results for input(s): "VITAMINB12", "FOLATE", "FERRITIN", "TIBC", "IRON", "RETICCTPCT" in the last 72 hours. Sepsis Labs: Recent Labs  Lab 02/27/22 1535 02/27/22 1735  LATICACIDVEN 2.3* 2.9*    No  results found for this or any previous visit (from the past 240 hour(s)).       Radiology Studies: No results found.      Scheduled Meds:  amLODipine  5 mg Oral Daily   calcitonin  200 Units Subcutaneous BID   heparin  5,000 Units Subcutaneous Q8H   mometasone-formoterol  2 puff Inhalation BID   Continuous Infusions:  sodium chloride 100 mL/hr at 02/28/22 0150     LOS: 0 days    Time spent: 45 minutes spent on chart review, discussion with nursing staff, consultants, updating family and interview/physical exam; more than 50% of that time was spent in counseling and/or coordination of care.    Geradine Girt, DO [facility] Available via Epic secure chat 7am-7pm After these hours, please refer to coverage provider listed on [email or web address] 02/28/2022, 12:23 PM

## 2022-02-28 NOTE — Progress Notes (Signed)
IP PROGRESS NOTE  Subjective:   Dylan Good presented to the emergency department with symptoms of hypercalcemia and dehydration.  Despite aggressive IV fluids and Zometa his calcium continues to be high.  He is currently receiving intravenous hydration.  Clinically he feels better with improvement in his pain.  His appetite remains poor. Objective:  Vital signs in last 24 hours: Temp:  [97.6 F (36.4 C)-98.9 F (37.2 C)] 97.7 F (36.5 C) (08/24 1434) Pulse Rate:  [88-114] 107 (08/24 1434) Resp:  [9-28] 16 (08/24 1434) BP: (94-135)/(45-88) 119/68 (08/24 1434) SpO2:  [91 %-100 %] 96 % (08/24 1434) Weight:  [132 lb 4.4 oz (60 kg)] 132 lb 4.4 oz (60 kg) (08/23 1525) Weight change:     Intake/Output from previous day: 08/23 0701 - 08/24 0700 In: 923.3 [I.V.:823.3; IV Piggyback:100] Out: -  General: Alert, awake without distress. Head: Normocephalic atraumatic. Mouth: mucous membranes moist, pharynx normal without lesions Eyes: No scleral icterus.  Pupils are equal and round reactive to light. Resp: clear to auscultation bilaterally without rhonchi or wheezes or dullness to percussion. Cardio: regular rate and rhythm, S1, S2 normal, no murmur, click, rub or gallop GI: soft, non-tender; bowel sounds normal; no masses,  no organomegaly Musculoskeletal: No joint deformity or effusion. Neurological: No motor, sensory deficits.  Intact deep tendon reflexes. Skin: No rashes or lesions.   Lab Results: Recent Labs    02/27/22 1342 02/28/22 0341  WBC 4.5 3.2*  HGB 11.1* 10.3*  HCT 34.1* 32.5*  PLT 357 305    BMET Recent Labs    02/27/22 1342 02/28/22 0341  NA 131* 135  K 4.4 3.9  CL 96* 103  CO2 22 24  GLUCOSE 69* 100*  BUN 13 10  CREATININE 0.75 0.73  CALCIUM >15.0* 13.4*          Studies/Results: No results found.  Medications: I have reviewed the patient's current medications.  Assessment/Plan:  51 year old with:  1.  Stage IV kidney cancer diagnosed  2022.  He has involvement with pulmonary and CNS metastasis.    His disease status was discussed on multiple occasions including review of his prognosis and treatment choices with his family.  He is opted to continue aggressive anticancer treatment and he is currently on axitinib.  2.  Hypercalcemia: Related to malignancy and dehydration.  His calcium has improved overnight to 13.4.  I agree with the current management plan.  3.  Prognosis and goals of care: This has been discussed with the patient on multiple occasions.  He is currently DNR which I certainly agree with.  Plan to continue aggressive measures however.  4.  Disposition: I have no objection to discharge once felt stable in the next 24 hours.   25  minutes were dedicated to this visit.  50% of time was spent face-to-face with the the time was spent on updating his disease status, reviewing laboratory data and future plan of care discussion.   LOS: 0 days   Zola Button 02/28/2022, 2:35 PM

## 2022-03-01 ENCOUNTER — Inpatient Hospital Stay: Payer: Medicaid Other

## 2022-03-01 DIAGNOSIS — E43 Unspecified severe protein-calorie malnutrition: Secondary | ICD-10-CM | POA: Insufficient documentation

## 2022-03-01 LAB — LACTIC ACID, PLASMA: Lactic Acid, Venous: 1.8 mmol/L (ref 0.5–1.9)

## 2022-03-01 LAB — MAGNESIUM: Magnesium: 1 mg/dL — ABNORMAL LOW (ref 1.7–2.4)

## 2022-03-01 LAB — BASIC METABOLIC PANEL
Anion gap: 8 (ref 5–15)
BUN: <5 mg/dL — ABNORMAL LOW (ref 6–20)
CO2: 20 mmol/L — ABNORMAL LOW (ref 22–32)
Calcium: 12.3 mg/dL — ABNORMAL HIGH (ref 8.9–10.3)
Chloride: 107 mmol/L (ref 98–111)
Creatinine, Ser: 0.58 mg/dL — ABNORMAL LOW (ref 0.61–1.24)
GFR, Estimated: >60 mL/min (ref >60)
Glucose, Bld: 82 mg/dL (ref 70–99)
Potassium: 3.3 mmol/L — ABNORMAL LOW (ref 3.5–5.1)
Sodium: 135 mmol/L (ref 135–145)

## 2022-03-01 LAB — CBC
HCT: 34.5 % — ABNORMAL LOW (ref 39.0–52.0)
Hemoglobin: 10.9 g/dL — ABNORMAL LOW (ref 13.0–17.0)
MCH: 24.1 pg — ABNORMAL LOW (ref 26.0–34.0)
MCHC: 31.6 g/dL (ref 30.0–36.0)
MCV: 76.3 fL — ABNORMAL LOW (ref 80.0–100.0)
Platelets: 262 10*3/uL (ref 150–400)
RBC: 4.52 MIL/uL (ref 4.22–5.81)
RDW: 16.4 % — ABNORMAL HIGH (ref 11.5–15.5)
WBC: 3.7 10*3/uL — ABNORMAL LOW (ref 4.0–10.5)
nRBC: 0 % (ref 0.0–0.2)

## 2022-03-01 MED ORDER — B COMPLEX-C PO TABS
1.0000 | ORAL_TABLET | Freq: Every day | ORAL | Status: DC
  Administered 2022-03-01 – 2022-03-03 (×3): 1 via ORAL
  Filled 2022-03-01 (×3): qty 1

## 2022-03-01 MED ORDER — BOOST / RESOURCE BREEZE PO LIQD CUSTOM
1.0000 | Freq: Two times a day (BID) | ORAL | Status: DC
  Administered 2022-03-01 – 2022-03-02 (×4): 1 via ORAL

## 2022-03-01 MED ORDER — POTASSIUM CHLORIDE CRYS ER 20 MEQ PO TBCR
40.0000 meq | EXTENDED_RELEASE_TABLET | Freq: Once | ORAL | Status: AC
  Administered 2022-03-01: 40 meq via ORAL
  Filled 2022-03-01: qty 2

## 2022-03-01 MED ORDER — ENSURE ENLIVE PO LIQD
237.0000 mL | ORAL | Status: DC
  Administered 2022-03-02: 237 mL via ORAL

## 2022-03-01 NOTE — Evaluation (Signed)
Occupational Therapy Evaluation Patient Details Name: DESHONE LYSSY MRN: [id number] DOB: January 02, 1971 Today's Date: 03/01/2022   History of Present Illness Patient is 51 y.o. male presented to the ED with symptoms of hypercalcemia and dehydration.  Despite aggressive IV fluids and Zometa his calcium continues to be high.  He is currently receiving intravenous hydration. 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   Clinical Impression   Mr. Jodey Burbano is a 51 year old man who presents with above medical history who presents with generalized weakness, decreased activity tolerance, and impaired balance resulting in a decline in functional abilities. He reports he is typically able to perform daily tasks while his girlfriend works - like dressing himself and going to the bathroom. Today patient requires min assist for ambulation and use of walker and min guard to min assist for ADLs - with setup and supervision predominantly the assist needed. Patient will benefit from skilled OT services while in hospital to improve deficits and learn compensatory strategies as needed in order to return to PLOF.  Do no expect he will need OT services at discharge. Has girlfriend and friends to assist at discharge.      Recommendations for follow up therapy are one component of a multi-disciplinary discharge planning process, led by the attending physician.  Recommendations may be updated based on patient status, additional functional criteria and insurance authorization.   Follow Up Recommendations  No OT follow up    Assistance Recommended at Discharge    Patient can return home with the following A little help with walking and/or transfers;A little help with bathing/dressing/bathroom;Assistance with cooking/housework;Direct supervision/assist for medications management;Direct supervision/assist for financial management;Help with stairs or ramp for entrance    Functional Status  Assessment  Patient has had a recent decline in their functional status and demonstrates the ability to make significant improvements in function in a reasonable and predictable amount of time.  Equipment Recommendations  None recommended by OT    Recommendations for Other Services       Precautions / Restrictions Precautions Precautions: Fall Restrictions Weight Bearing Restrictions: No      Mobility Bed Mobility Overal bed mobility: Needs Assistance Bed Mobility: Supine to Sit     Supine to sit: Supervision          Transfers Overall transfer level: Needs assistance Equipment used: Rolling walker (2 wheels) Transfers: Sit to/from Stand Sit to Stand: Min assist           General transfer comment: Min assist for walker management and verbal cues for use.      Balance Overall balance assessment: Mild deficits observed, not formally tested                                         ADL either performed or assessed with clinical judgement   ADL Overall ADL's : Needs assistance/impaired Eating/Feeding: Independent   Grooming: Set up;Supervision/safety   Upper Body Bathing: Set up;Supervision/ safety   Lower Body Bathing: Minimal assistance;Supervison/ safety;Sit to/from stand   Upper Body Dressing : Set up;Supervision/safety   Lower Body Dressing: Minimal assistance;Sit to/from stand   Toilet Transfer: Agricultural engineer (2 wheels)   Toileting- Water quality scientist and Hygiene: Sit to/from stand;Minimal assistance Toileting - Clothing Manipulation Details (indicate cue type and reason): Min assist     Functional mobility during ADLs: Rolling walker (2  wheels)       Vision Baseline Vision/History: 1 Wears glasses Patient Visual Report: No change from baseline       Perception     Praxis      Pertinent Vitals/Pain Pain Assessment Pain Assessment: No/denies pain     Hand Dominance Right   Extremity/Trunk Assessment  Upper Extremity Assessment Upper Extremity Assessment: Overall WFL for tasks assessed   Lower Extremity Assessment Lower Extremity Assessment: Defer to PT evaluation   Cervical / Trunk Assessment Cervical / Trunk Assessment: Normal   Communication Communication Communication: No difficulties   Cognition Arousal/Alertness: Awake/alert Behavior During Therapy: WFL for tasks assessed/performed Overall Cognitive Status: No family/caregiver present to determine baseline cognitive functioning                           Safety/Judgement: Decreased awareness of deficits Awareness: Emergent Problem Solving: Slow processing, Requires verbal cues       General Comments       Exercises     Shoulder Instructions      Home Living Family/patient expects to be discharged to:: Private residence Living Arrangements: Spouse/significant other Available Help at Discharge: Family;Available PRN/intermittently Type of Home: Apartment Home Access: Level entry;Stairs to enter Entrance Stairs-Number of Steps: 1   Home Layout: Two level;Bed/bath upstairs;1/2 bath on main level Alternate Level Stairs-Number of Steps: 9 Alternate Level Stairs-Rails: Right;Left Bathroom Shower/Tub: Teacher, early years/pre: Standard Bathroom Accessibility: Yes   Home Equipment: Grab bars - tub/shower;Shower seat;Cane - single point;Wheelchair - manual   Additional Comments: pt's girlfriend will be with him at home and additional friends/family can assist when she is at work. has a friend in room during evaluation.      Prior Functioning/Environment Prior Level of Function : Independent/Modified Independent;Driving             Mobility Comments: uses walker PRN or furniture surfs ADLs Comments: has assistance with showering and LB ADLs as needed        OT Problem List: Decreased activity tolerance;Impaired balance (sitting and/or standing);Decreased knowledge of use of DME or  AE;Decreased strength;Decreased cognition      OT Treatment/Interventions: Self-care/ADL training;Patient/family education;Visual/perceptual remediation/compensation;Therapeutic activities;Balance training    OT Goals(Current goals can be found in the care plan section) Acute Rehab OT Goals Patient Stated Goal: improve balance OT Goal Formulation: With patient Time For Goal Achievement: 03/15/22 Potential to Achieve Goals: Good  OT Frequency: Min 2X/week    Co-evaluation              AM-PAC OT "6 Clicks" Daily Activity     Outcome Measure Help from another person eating meals?: None Help from another person taking care of personal grooming?: A Little Help from another person toileting, which includes using toliet, bedpan, or urinal?: A Little Help from another person bathing (including washing, rinsing, drying)?: A Little Help from another person to put on and taking off regular upper body clothing?: A Little Help from another person to put on and taking off regular lower body clothing?: A Little 6 Click Score: 19   End of Session Equipment Utilized During Treatment: Rolling walker (2 wheels) Nurse Communication: Mobility status  Activity Tolerance: Patient tolerated treatment well Patient left: in chair;with call bell/phone within reach;with family/visitor present  OT Visit Diagnosis: Unsteadiness on feet (R26.81);Other symptoms and signs involving cognitive function                Time: 4081-4481 OT  Time Calculation (min): 12 min Charges:     Derl Barrow, OTR/L [place]  Office [phone] Pager: [place] 03/01/2022, 3:19 PM

## 2022-03-01 NOTE — Progress Notes (Addendum)
PROGRESS NOTE    Dylan Good  MWU:[id number] DOB: 28-Jan-1971 DOA: 02/27/2022 PCP: Dorise Hiss, PA-C    Brief Narrative:  Dylan Good is a 51 y.o. 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  He presents to the emergency department for evaluation of weakness and hypercalcemia.  During labs drawn today, calcium found to be greater than 15.0.  Patient presented to the cancer to center today for IV hydration.  Last seen in the emergency department on 02/12/22 for calcium 14.9, was treated with IV fluids and Zometa, discharged home at that time after discussion with oncology.  Patient complains of feeling generally weak.  He has had poor PO intake.   Assessment and Plan:  Hypercalcemia of malignancy Patient has received a dose of IV Zometa in the ER. Also currently receiving IV fluid. Calcitonin given -oncology VOZ:DGUYQIH to malignancy and dehydration -daily labs   Kidney cancer, primary, with metastasis from kidney to other site [facility]) With brain and lung metastasis. -poor overall prognosis   Hyponatremia -prob poor PO intake -improved   Hypokalemia -replete  Hypertension Patient is on Norvasc-- hold for low BP   Malnutrition Nutrition Status: Nutrition Problem: Severe Malnutrition Etiology: cancer and cancer related treatments, chronic illness Signs/Symptoms: moderate fat depletion, moderate muscle depletion, energy intake < or equal to 75% for > or equal to 1 month, percent weight loss Interventions: Ensure Enlive (each supplement provides 350kcal and 20 grams of protein), Boost Breeze    PT/OT eval pending   DVT prophylaxis: heparin injection 5,000 Units Start: 02/27/22 2200    Code Status: DNR Family Communication:   Disposition Plan:  Level of care: Med-Surg Status is: Inpatient Remains inpatient appropriate because: needs correction of calcium    Consultants:   oncology   Subjective: No SOB, no CP  Objective: Vitals:   02/28/22 1434 02/28/22 2027 03/01/22 0522 03/01/22 0821  BP: 119/68 116/65 (!) 109/52   Pulse: (!) 107 (!) 106 (!) 103   Resp: 16 17 18    Temp: 97.7 F (36.5 C) 97.9 F (36.6 C) 98.2 F (36.8 C)   TempSrc: Oral Oral Oral   SpO2: 96% 96% 97% 97%  Weight:      Height:        Intake/Output Summary (Last 24 hours) at 03/01/2022 1145 Last data filed at 03/01/2022 0959 Gross per 24 hour  Intake 2519.8 ml  Output 1300 ml  Net 1219.8 ml   Filed Weights   02/27/22 1525  Weight: 60 kg    Examination:   General: Appearance:    Thin male in no acute distress     Lungs:     respirations unlabored  Heart:    Tachycardic.    MS:   All extremities are intact.    Neurologic:   Awake, alert       Data Reviewed: I have personally reviewed following labs and imaging studies  CBC: Recent Labs  Lab 02/27/22 1342 02/28/22 0341 03/01/22 0604  WBC 4.5 3.2* 3.7*  NEUTROABS 2.6  --   --   HGB 11.1* 10.3* 10.9*  HCT 34.1* 32.5* 34.5*  MCV 74.8* 77.0* 76.3*  PLT 357 305 474   Basic Metabolic Panel: Recent Labs  Lab 02/27/22 1342 02/28/22 0341 02/28/22 2122 03/01/22 0604  NA 131* 135 133* 135  K 4.4 3.9 4.7 3.3*  CL 96* 103 107 107  CO2 22 24 18* 20*  GLUCOSE 69* 100* 92 82  BUN 13 10 7  <5*  CREATININE 0.75 0.73 0.65 0.58*  CALCIUM >15.0* 13.4* 12.3* 12.3*  MG  --   --   --  1.0*   GFR: Estimated Creatinine Clearance: 94.8 mL/min (A) (by C-G formula based on SCr of 0.58 mg/dL (L)). Liver Function Tests: Recent Labs  Lab 02/27/22 1342 02/28/22 2122  AST 20 21  ALT 6 6  ALKPHOS 56 44  BILITOT 1.0 1.2  PROT 8.2* 6.6  ALBUMIN 4.0 3.1*   Recent Labs  Lab 02/28/22 0341  LIPASE 20   No results for input(s): "AMMONIA" in the last 168 hours. Coagulation Profile: No results for input(s): "INR", "PROTIME" in the last 168 hours. Cardiac Enzymes: No results for input(s): "CKTOTAL", "CKMB",  "CKMBINDEX", "TROPONINI" in the last 168 hours. BNP (last 3 results) No results for input(s): "PROBNP" in the last 8760 hours. HbA1C: No results for input(s): "HGBA1C" in the last 72 hours. CBG: No results for input(s): "GLUCAP" in the last 168 hours. Lipid Profile: No results for input(s): "CHOL", "HDL", "LDLCALC", "TRIG", "CHOLHDL", "LDLDIRECT" in the last 72 hours. Thyroid Function Tests: No results for input(s): "TSH", "T4TOTAL", "FREET4", "T3FREE", "THYROIDAB" in the last 72 hours. Anemia Panel: No results for input(s): "VITAMINB12", "FOLATE", "FERRITIN", "TIBC", "IRON", "RETICCTPCT" in the last 72 hours. Sepsis Labs: Recent Labs  Lab 02/27/22 1535 02/27/22 1735 03/01/22 0604  LATICACIDVEN 2.3* 2.9* 1.8    No results found for this or any previous visit (from the past 240 hour(s)).       Radiology Studies: No results found.      Scheduled Meds:  B-complex with vitamin C  1 tablet Oral Daily   feeding supplement  1 Container Oral BID BM   feeding supplement  237 mL Oral Q24H   heparin  5,000 Units Subcutaneous Q8H   mometasone-formoterol  2 puff Inhalation BID   Continuous Infusions:     LOS: 1 day    Time spent: 45 minutes spent on chart review, discussion with nursing staff, consultants, updating family and interview/physical exam; more than 50% of that time was spent in counseling and/or coordination of care.    Geradine Girt, DO [facility] Available via Epic secure chat 7am-7pm After these hours, please refer to coverage provider listed on [email or web address] 03/01/2022, 11:45 AM

## 2022-03-01 NOTE — Progress Notes (Signed)
Initial Nutrition Assessment  DOCUMENTATION CODES:   Severe malnutrition in context of chronic illness  INTERVENTION:   -Ensure Plus High Protein po daily, each supplement provides 350 kcal and 20 grams of protein.   -Boost Breeze po BID, each supplement provides 250 kcal and 9 grams of protein   -B complex with  Vitamin C daily  NUTRITION DIAGNOSIS:   Severe Malnutrition related to cancer and cancer related treatments, chronic illness as evidenced by moderate fat depletion, moderate muscle depletion, energy intake < or equal to 75% for > or equal to 1 month, percent weight loss.  GOAL:   Patient will meet greater than or equal to 90% of their needs  MONITOR:   PO intake, Supplement acceptance, Labs, Weight trends, I & O's  REASON FOR ASSESSMENT:   Malnutrition Screening Tool    ASSESSMENT:   51 y.o. 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  He presents to the emergency department for evaluation of weakness and hypercalcemia.  Patient in room, shivering and feeling cold. Adjusted thermostat in room. Pt states he ate some grilled cheese this morning for breakfast with 2 [place]. Consumed ~1/4 of the grilled cheese. States his appetite tends to fluctuate but in the past month he has had mostly bad eating days with poor appetite. States someone is bringing him [employer] for lunch.  He is agreeable to receiving Ensure and Boost protein supplements.   Per weight records, pt has lost 21 lbs since 3/31 (13% wt loss x 5 months, significant for time frame).  Medications: KLOR-CON, IV Zofran  Labs reviewed: Low K Low Mg (1.0)   NUTRITION - FOCUSED PHYSICAL EXAM:  Flowsheet Row Most Recent Value  Orbital Region Moderate depletion  Upper Arm Region Moderate depletion  Thoracic and Lumbar Region Unable to assess  Buccal Region Mild depletion  Temple Region Moderate depletion  Clavicle Bone Region  Moderate depletion  Clavicle and Acromion Bone Region Moderate depletion  Scapular Bone Region Moderate depletion  Dorsal Hand Moderate depletion  Patellar Region Moderate depletion  Anterior Thigh Region Moderate depletion  Posterior Calf Region Moderate depletion  Edema (RD Assessment) None  Hair Reviewed  Eyes Reviewed  Mouth Reviewed  Skin Reviewed       Diet Order:   Diet Order             Diet regular Room service appropriate? Yes; Fluid consistency: Thin  Diet effective now                   EDUCATION NEEDS:   No education needs have been identified at this time  Skin:  Skin Assessment: Reviewed RN Assessment  Last BM:  PTA  Height:   Ht Readings from Last 1 Encounters:  02/27/22 5\' 9"  (1.753 m)    Weight:   Wt Readings from Last 1 Encounters:  02/27/22 60 kg    BMI:  Body mass index is 19.53 kg/m.  Estimated Nutritional Needs:   Kcal:  1850-2050  Protein:  90-100g  Fluid:  2L/day  Clayton Bibles, MS, RD, LDN Inpatient Clinical Dietitian Contact information available via Amion

## 2022-03-01 NOTE — Plan of Care (Signed)

## 2022-03-01 NOTE — Evaluation (Signed)
Physical Therapy Evaluation Patient Details Name: Dylan Good MRN: [id number] DOB: [birth year] Today's Date: 03/01/2022  History of Present Illness  Patient is 51 y.o. male presented to the ED with symptoms of hypercalcemia and dehydration.  Despite aggressive IV fluids and Zometa his calcium continues to be high.  He is currently receiving intravenous hydration. 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    Clinical Impression  Dylan Good is 51 y.o. male admitted with above HPI and diagnosis. Patient is currently limited by functional impairments below (see PT problem list). Patient lives with his girlfriend and has required assist with ADL's and mobility more recently. Currently pt requires min assist for transfers and gait with RW. Pt's girlfriend feels they can provide level of assist needed at home and pt will need RW for safety with mobility. Recommend HHPT if pt can receive short bout of services. Patient will benefit from continued skilled PT interventions to address impairments and progress independence with mobility. Acute PT will follow and progress as able.        Recommendations for follow up therapy are one component of a multi-disciplinary discharge planning process, led by the attending physician.  Recommendations may be updated based on patient status, additional functional criteria and insurance authorization.  PT Recommendation   Follow Up Recommendations Home health PT  [Pt previously on hospice but transitioned to palliative care, would benefit from HHPT, unsure what ins. will approve.] Filed 03/01/2022 0843  Assistance recommended at discharge Frequent or constant Supervision/Assistance Filed 03/01/2022 0843  Patient can return home with the following A little help with walking and/or transfers, A little help with bathing/dressing/bathroom, Assistance with cooking/housework, Direct supervision/assist for medications management, Assist for  transportation, Help with stairs or ramp for entrance [facility] 03/01/2022 0843  Functional Status Assessment Patient has had a recent decline in their functional status and demonstrates the ability to make significant improvements in function in a reasonable and predictable amount of time. Filed 03/01/2022 0843  PT equipment Rolling walker (2 wheels) Filed 03/01/2022 0843    Mobility  Bed Mobility Overal bed mobility: Needs Assistance Bed Mobility: Supine to Sit     Supine to sit: Min guard     General bed mobility comments: HOB slightly elevated, guaridng with cues for sequencing to use bed rail and pivot to EOB.    Transfers Overall transfer level: Needs assistance Equipment used: Rolling walker (2 wheels) Transfers: Sit to/from Stand Sit to Stand: Min assist           General transfer comment: Assist to complete power up/rise and steady in standing with RW.    Ambulation/Gait Ambulation/Gait assistance: Min assist Gait Distance (Feet): 120 Feet Assistive device: Rolling walker (2 wheels) Gait Pattern/deviations: Step-through pattern, Decreased stride length, Shuffle, Drifts right/left, Trunk flexed Gait velocity: decr     General Gait Details: Min assist to manage walker position with gait, pt stepping one foot in/one foot out during turns and assist to correct required. min assist to steady for slight drift Rt/Lt, no LOB.  Stairs            Wheelchair Mobility    Modified Rankin (Stroke Patients Only)       Balance Overall balance assessment: Needs assistance Sitting-balance support: Feet supported Sitting balance-Leahy Scale: Good     Standing balance support: Reliant on assistive device for balance, During functional activity, Bilateral upper extremity supported Standing balance-Leahy Scale: Fair  Pertinent Vitals/Pain Pain Assessment Pain Assessment: No/denies pain       03/01/22 0843  Home Living   Family/patient expects to be discharged to: Private residence  Living Arrangements Spouse/significant other  Available Help at Discharge Family;Available PRN/intermittently  Type of Home Apartment  Home Access Level entry;Stairs to enter  Entrance Stairs-Number of Steps 1  Home Layout Two level;Bed/bath upstairs;1/2 bath on main level  Alternate Level Stairs-Number of Steps 9  Alternate Level Stairs-Rails Right;Left  Bathroom Shower/Tub Biochemist, clinical Yes  Home Equipment Grab bars - tub/shower;Shower seat;Cane - single point;Wheelchair - manual  Additional Comments pt's girlfriend will be with him at home and additional friends/family can assist  Prior Function  Prior Level of Function  Independent/Modified Independent;Driving    Hand Dominance        Extremity/Trunk Assessment                Communication      Cognition Arousal/Alertness: Awake/alert Behavior During Therapy: WFL for tasks assessed/performed Overall Cognitive Status: Impaired/Different from baseline Area of Impairment: Orientation, Following commands, Safety/judgement, Awareness, Problem solving                 Orientation Level: Disoriented to, Time     Following Commands: Follows one step commands with increased time, Follows one step commands consistently, Follows multi-step commands inconsistently, Follows multi-step commands with increased time Safety/Judgement: Decreased awareness of safety, Decreased awareness of deficits Awareness: Emergent Problem Solving: Slow processing, Requires verbal cues, Difficulty sequencing General Comments: pt pleasant          03/01/22 0843  PT - End of Session  Equipment Utilized During Treatment Gait belt  Activity Tolerance Patient tolerated treatment well  Patient left in chair;with call bell/phone within reach;with chair alarm set  Nurse Communication Mobility status  PT Assessment  PT  Recommendation/Assessment Patient needs continued PT services  PT Visit Diagnosis Unsteadiness on feet (R26.81);Muscle weakness (generalized) (M62.81);Difficulty in walking, not elsewhere classified (R26.2)  PT Problem List Decreased strength;Decreased range of motion;Decreased activity tolerance;Decreased balance;Decreased mobility;Decreased knowledge of use of DME;Decreased knowledge of precautions  PT Plan  PT Frequency (ACUTE ONLY) Min 3X/week  PT Treatment/Interventions (ACUTE ONLY) DME instruction;Gait training;Stair training;Functional mobility training;Therapeutic activities;Therapeutic exercise;Balance training;Neuromuscular re-education;Patient/family education  AM-PAC PT "6 Clicks" Mobility Outcome Measure (Version 2)  Help needed turning from your back to your side while in a flat bed without using bedrails? 3  Help needed moving from lying on your back to sitting on the side of a flat bed without using bedrails? 3  Help needed moving to and from a bed to a chair (including a wheelchair)? 3  Help needed standing up from a chair using your arms (e.g., wheelchair or bedside chair)? 3  Help needed to walk in hospital room? 3  Help needed climbing 3-5 steps with a railing?  2  6 Click Score 17  Consider Recommendation of Discharge To: Home with [facility]  Progressive Mobility  What is the highest level of mobility based on the progressive mobility assessment? Level 5 (Walks with assist in room/hall) - Balance while stepping forward/back and can walk in room with assist - Complete  Activity Ambulated with assistance in hallway  PT Recommendation  Follow Up Recommendations Home health PT (Pt previously on hospice but transitioned to palliative care, would benefit from [place], unsure what ins. will approve.)  Assistance recommended at discharge Frequent or constant Supervision/Assistance  Patient can return home with the following  A little help with walking and/or transfers;A little help with  bathing/dressing/bathroom;Assistance with cooking/housework;Direct supervision/assist for medications management;Assist for transportation;Help with stairs or ramp for entrance  Functional Status Assessment Patient has had a recent decline in their functional status and demonstrates the ability to make significant improvements in function in a reasonable and predictable amount of time.  PT equipment Rolling walker (2 wheels)  Individuals Consulted  Consulted and Agree with Results and Recommendations Patient;Family member/caregiver  Family Member Consulted spoke with spignificant other on phone  Acute Rehab PT Goals  Patient Stated Goal get home and stronger  PT Goal Formulation With patient  Time For Goal Achievement 03/15/22  Potential to Achieve Goals Good  PT Time Calculation  PT Start Time (ACUTE ONLY) 0840  PT Stop Time (ACUTE ONLY) 0904  PT Time Calculation (min) (ACUTE ONLY) 24 min  PT General Charges  $$ ACUTE PT VISIT 1 Visit  PT Evaluation  $PT Eval Moderate Complexity 1 Mod  PT Treatments  $Gait Training 8-22 mins     Verner Mould, DPT Acute Rehabilitation Services Office [phone] Pager [phone]  03/01/22 2:49 PM

## 2022-03-02 ENCOUNTER — Inpatient Hospital Stay (HOSPITAL_COMMUNITY): Payer: Medicaid Other

## 2022-03-02 LAB — BASIC METABOLIC PANEL
Anion gap: 7 (ref 5–15)
BUN: <5 mg/dL — ABNORMAL LOW (ref 6–20)
CO2: 18 mmol/L — ABNORMAL LOW (ref 22–32)
Calcium: 12.1 mg/dL — ABNORMAL HIGH (ref 8.9–10.3)
Chloride: 106 mmol/L (ref 98–111)
Creatinine, Ser: 0.5 mg/dL — ABNORMAL LOW (ref 0.61–1.24)
GFR, Estimated: >60 mL/min (ref >60)
Glucose, Bld: 91 mg/dL (ref 70–99)
Potassium: 3.2 mmol/L — ABNORMAL LOW (ref 3.5–5.1)
Sodium: 131 mmol/L — ABNORMAL LOW (ref 135–145)

## 2022-03-02 MED ORDER — MAGNESIUM SULFATE 4 GM/100ML IV SOLN
4.0000 g | Freq: Once | INTRAVENOUS | Status: AC
Start: 2022-03-02 — End: 2022-03-02
  Administered 2022-03-02: 4 g via INTRAVENOUS
  Filled 2022-03-02: qty 100

## 2022-03-02 MED ORDER — MEDIHONEY WOUND/BURN DRESSING EX PSTE
1.0000 | PASTE | Freq: Every day | CUTANEOUS | Status: DC
  Administered 2022-03-02 – 2022-03-03 (×2): 1 via TOPICAL
  Filled 2022-03-02 (×2): qty 44

## 2022-03-02 MED ORDER — MAGNESIUM SULFATE 2 GM/50ML IV SOLN: 2.0000 g | Freq: Once | INTRAVENOUS | Status: DC

## 2022-03-02 MED ORDER — PROCHLORPERAZINE EDISYLATE 10 MG/2ML IJ SOLN
10.0000 mg | Freq: Four times a day (QID) | INTRAMUSCULAR | Status: DC | PRN
  Filled 2022-03-02: qty 2

## 2022-03-02 MED ORDER — MAGNESIUM OXIDE -MG SUPPLEMENT 400 (240 MG) MG PO TABS
400.0000 mg | ORAL_TABLET | Freq: Every day | ORAL | Status: DC
  Administered 2022-03-02: 400 mg via ORAL
  Filled 2022-03-02: qty 1

## 2022-03-02 MED ORDER — ONDANSETRON HCL 4 MG PO TABS
4.0000 mg | ORAL_TABLET | Freq: Three times a day (TID) | ORAL | Status: DC
  Administered 2022-03-02 – 2022-03-03 (×3): 4 mg via ORAL
  Filled 2022-03-02 (×3): qty 1

## 2022-03-02 MED ORDER — POTASSIUM CHLORIDE CRYS ER 20 MEQ PO TBCR
40.0000 meq | EXTENDED_RELEASE_TABLET | Freq: Once | ORAL | Status: AC
  Administered 2022-03-02: 40 meq via ORAL
  Filled 2022-03-02: qty 2

## 2022-03-02 NOTE — Progress Notes (Signed)
PROGRESS NOTE    Dylan Good  FBP:[id number] DOB: 1970-09-07 DOA: 02/27/2022 PCP: Dorise Hiss, PA-C    Brief Narrative:  Dylan Good is a 51 y.o. 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  He presents to the emergency department for evaluation of weakness and hypercalcemia.  During labs drawn today, calcium found to be greater than 15.0.  Patient presented to the cancer to center today for IV hydration.  Last seen in the emergency department on 02/12/22 for calcium 14.9, was treated with IV fluids and Zometa, discharged home at that time after discussion with oncology.  Patient complains of feeling generally weak.  He has had poor PO intake.   Assessment and Plan:  Hypercalcemia of malignancy Patient has received a dose of IV Zometa in the ER. Calcitonin given -oncology saw: Related to malignancy and dehydration -daily labs   Kidney cancer, primary, with metastasis from kidney to other site [facility]) With brain and lung metastasis. -poor overall prognosis   Hyponatremia -prob poor PO intake -improved   Hypokalemia/hypomagnesemia -replete  Hypertension Patient is on Norvasc-- held for low BP   Malnutrition Nutrition Status: Nutrition Problem: Severe Malnutrition Etiology: cancer and cancer related treatments, chronic illness Signs/Symptoms: moderate fat depletion, moderate muscle depletion, energy intake < or equal to 75% for > or equal to 1 month, percent weight loss Interventions: Ensure Enlive (each supplement provides 350kcal and 20 grams of protein), Boost Breeze    PT/OT eval- home health  Poor overall prognosis  DVT prophylaxis: heparin injection 5,000 Units Start: 02/27/22 2200    Code Status: DNR Family Communication: updated significant other  Disposition Plan:  Level of care: Med-Surg Status is: Inpatient Remains inpatient appropriate because: needs correction of  calcium/replacement of Mg    Consultants:  oncology   Subjective: No SOB, no CP  Objective: Vitals:   03/01/22 2026 03/01/22 2048 03/02/22 0551 03/02/22 0811  BP:  105/63 106/68   Pulse:  (!) 109 (!) 109   Resp:  18    Temp:  98.5 F (36.9 C) 98.3 F (36.8 C)   TempSrc:  Oral Oral   SpO2: 98% 96% 96% 97%  Weight:      Height:        Intake/Output Summary (Last 24 hours) at 03/02/2022 1329 Last data filed at 03/01/2022 1348 Gross per 24 hour  Intake 0 ml  Output 800 ml  Net -800 ml   Filed Weights   02/27/22 1525  Weight: 60 kg    Examination:    General: Appearance:    Thin male in no acute distress     Lungs:     respirations unlabored  Heart:    Tachycardic.   MS:   All extremities are intact.   Neurologic:   Awake, alert         Data Reviewed: I have personally reviewed following labs and imaging studies  CBC: Recent Labs  Lab 02/27/22 1342 02/28/22 0341 03/01/22 0604  WBC 4.5 3.2* 3.7*  NEUTROABS 2.6  --   --   HGB 11.1* 10.3* 10.9*  HCT 34.1* 32.5* 34.5*  MCV 74.8* 77.0* 76.3*  PLT 357 305 824   Basic Metabolic Panel: Recent Labs  Lab 02/27/22 1342 02/28/22 0341 02/28/22 2122 03/01/22 0604 03/02/22 0732  NA 131* 135 133* 135 131*  K 4.4 3.9 4.7 3.3* 3.2*  CL 96* 103 107 107 106  CO2 22 24 18* 20* 18*  GLUCOSE 69* 100* 92 82 91  BUN 13 10 7  <5* <5*  CREATININE 0.75 0.73 0.65 0.58* 0.50*  CALCIUM >15.0* 13.4* 12.3* 12.3* 12.1*  MG  --   --   --  1.0*  --    GFR: Estimated Creatinine Clearance: 94.8 mL/min (A) (by C-G formula based on SCr of 0.5 mg/dL (L)). Liver Function Tests: Recent Labs  Lab 02/27/22 1342 02/28/22 2122  AST 20 21  ALT 6 6  ALKPHOS 56 44  BILITOT 1.0 1.2  PROT 8.2* 6.6  ALBUMIN 4.0 3.1*   Recent Labs  Lab 02/28/22 0341  LIPASE 20   No results for input(s): "AMMONIA" in the last 168 hours. Coagulation Profile: No results for input(s): "INR", "PROTIME" in the last 168 hours. Cardiac  Enzymes: No results for input(s): "CKTOTAL", "CKMB", "CKMBINDEX", "TROPONINI" in the last 168 hours. BNP (last 3 results) No results for input(s): "PROBNP" in the last 8760 hours. HbA1C: No results for input(s): "HGBA1C" in the last 72 hours. CBG: No results for input(s): "GLUCAP" in the last 168 hours. Lipid Profile: No results for input(s): "CHOL", "HDL", "LDLCALC", "TRIG", "CHOLHDL", "LDLDIRECT" in the last 72 hours. Thyroid Function Tests: No results for input(s): "TSH", "T4TOTAL", "FREET4", "T3FREE", "THYROIDAB" in the last 72 hours. Anemia Panel: No results for input(s): "VITAMINB12", "FOLATE", "FERRITIN", "TIBC", "IRON", "RETICCTPCT" in the last 72 hours. Sepsis Labs: Recent Labs  Lab 02/27/22 1535 02/27/22 1735 03/01/22 0604  LATICACIDVEN 2.3* 2.9* 1.8    No results found for this or any previous visit (from the past 240 hour(s)).       Radiology Studies: DG Abd Portable 1V  Result Date: 03/02/2022 CLINICAL DATA:  Diffuse abdominal pain.  Renal cell carcinoma EXAM: PORTABLE ABDOMEN - 1 VIEW COMPARISON:  CT 02/05/2022 FINDINGS: Nonobstructive bowel gas pattern. Calcifications in the right kidney compatible with intratumoral calcifications related to patient's known renal cell carcinoma. No gross free intraperitoneal air. Known lytic osseous metastatic lesions including the right tenth rib, L1, and left femoral neck are better seen by recent CT. IMPRESSION: Nonobstructive bowel gas pattern. Electronically Signed   By: Davina Poke D.O.   On: 03/02/2022 11:36        Scheduled Meds:  B-complex with vitamin C  1 tablet Oral Daily   feeding supplement  1 Container Oral BID BM   feeding supplement  237 mL Oral Q24H   heparin  5,000 Units Subcutaneous Q8H   leptospermum manuka honey  1 Application Topical Daily   magnesium oxide  400 mg Oral QHS   mometasone-formoterol  2 puff Inhalation BID   ondansetron  4 mg Oral TID AC   Continuous Infusions:     LOS: 2  days    Time spent: 45 minutes spent on chart review, discussion with nursing staff, consultants, updating family and interview/physical exam; more than 50% of that time was spent in counseling and/or coordination of care.    Geradine Girt, DO [facility] Available via Epic secure chat 7am-7pm After these hours, please refer to coverage provider listed on [email or web address] 03/02/2022, 1:29 PM

## 2022-03-02 NOTE — Progress Notes (Signed)
Patient up to chair & pressure redistribution pad placed in chair.

## 2022-03-02 NOTE — Consult Note (Signed)
WOC Nurse Consult Note: Reason for Consult:Unstageable pressure injury to coccyx Wound type:Pressure Pressure Injury POA: Yes Measurement:2.6cm round with depth unable to be determined due to the presence of yellow slough in center Wound bed:As noted above Drainage (amount, consistency, odor) small light yellow consistent with autolysis of center yellow tissue Periwound:intact, mild erythema Dressing procedure/placement/frequency: I have provided Nursing with guidance for the topical care of this wound using a NS cleanse, pat dry followed by application of Medihoney in a thin layer topped with a silicone foam. This is to be performed daily and PRN soiling.Turning and repositioning is in place, but guidance is provided for minimizing time in the supine position.Heels are to be floated. A pressure redistribution chair pad is provided for OOB use.  [place] nursing team will not follow, but will remain available to this patient, the nursing and medical teams.  Please re-consult if needed.  Thank you for inviting [place] to participate in this patient's Plan of Care.  Maudie Flakes, MSN, RN, CNS, [place], Serita Grammes, [employer], [employer] phone:  [phone]

## 2022-03-02 NOTE — Progress Notes (Signed)
Patient stable & in no distress. Yellow MEWS implemented.  03/02/22 1448  Assess: MEWS Score  Temp 99.3 F (37.4 C)  BP 112/64  MAP (mmHg) 78  Pulse Rate (!) 107  Resp 13  Level of Consciousness Alert  SpO2 96 %  O2 Device Room Air  Patient Activity (if Appropriate) In bed  Assess: MEWS Score  MEWS Temp 0  MEWS Systolic 0  MEWS Pulse 1  MEWS RR 1  MEWS LOC 0  MEWS Score 2  MEWS Score Color Yellow  Assess: SIRS CRITERIA  SIRS Temperature  0  SIRS Pulse 1  SIRS Respirations  0  SIRS WBC 1  SIRS Score Sum  2

## 2022-03-02 NOTE — Progress Notes (Signed)
Wound to coccyx cleaned with normal saline, applied Medi-Honey & covered with silicone Mepilex dressing.

## 2022-03-03 LAB — BASIC METABOLIC PANEL
Anion gap: 8 (ref 5–15)
BUN: <5 mg/dL — ABNORMAL LOW (ref 6–20)
CO2: 21 mmol/L — ABNORMAL LOW (ref 22–32)
Calcium: 12.5 mg/dL — ABNORMAL HIGH (ref 8.9–10.3)
Chloride: 103 mmol/L (ref 98–111)
Creatinine, Ser: 0.63 mg/dL (ref 0.61–1.24)
GFR, Estimated: >60 mL/min (ref >60)
Glucose, Bld: 106 mg/dL — ABNORMAL HIGH (ref 70–99)
Potassium: 3.8 mmol/L (ref 3.5–5.1)
Sodium: 132 mmol/L — ABNORMAL LOW (ref 135–145)

## 2022-03-03 LAB — CBC
HCT: 32.5 % — ABNORMAL LOW (ref 39.0–52.0)
Hemoglobin: 10.2 g/dL — ABNORMAL LOW (ref 13.0–17.0)
MCH: 23.8 pg — ABNORMAL LOW (ref 26.0–34.0)
MCHC: 31.4 g/dL (ref 30.0–36.0)
MCV: 75.9 fL — ABNORMAL LOW (ref 80.0–100.0)
Platelets: 234 10*3/uL (ref 150–400)
RBC: 4.28 MIL/uL (ref 4.22–5.81)
RDW: 16.6 % — ABNORMAL HIGH (ref 11.5–15.5)
WBC: 3.9 10*3/uL — ABNORMAL LOW (ref 4.0–10.5)
nRBC: 0 % (ref 0.0–0.2)

## 2022-03-03 LAB — MAGNESIUM: Magnesium: 1.6 mg/dL — ABNORMAL LOW (ref 1.7–2.4)

## 2022-03-03 MED ORDER — MEDIHONEY WOUND/BURN DRESSING EX PSTE: 1.0000 | PASTE | Freq: Every day | CUTANEOUS | 0 refills | Status: AC

## 2022-03-03 MED ORDER — ONDANSETRON HCL 4 MG PO TABS: 4.0000 mg | ORAL_TABLET | Freq: Three times a day (TID) | ORAL | 0 refills | Status: DC

## 2022-03-03 MED ORDER — MAGNESIUM SULFATE IN D5W 1-5 GM/100ML-% IV SOLN
1.0000 g | Freq: Once | INTRAVENOUS | Status: AC
Start: 2022-03-03 — End: 2022-03-03
  Administered 2022-03-03: 1 g via INTRAVENOUS
  Filled 2022-03-03: qty 100

## 2022-03-03 MED ORDER — MAGNESIUM OXIDE -MG SUPPLEMENT 400 (240 MG) MG PO TABS: 400.0000 mg | ORAL_TABLET | Freq: Every day | ORAL | 0 refills | Status: AC

## 2022-03-03 NOTE — Discharge Summary (Addendum)
=     Physician Discharge Summary  Dylan Good WFU:[id number] DOB: 08-25-1970 DOA: 02/27/2022  PCP: Dorise Hiss, PA-C  Admit date: 02/27/2022 Discharge date: 03/03/2022  Admitted From: home Discharge disposition: home   Recommendations for Outpatient Follow-Up:   Family says they are meeting with palliative care next month-- encouraged them to keep the appointment and to consider hospice Keep hydrated Follow calcium levels and magnesium Trial of scheduled reglan High risk to bounce back due to high calcium   Discharge Diagnosis:   Principal Problem:   Hypercalcemia Active Problems:   Kidney cancer, primary, with metastasis from kidney to other site [facility])   Protein-calorie malnutrition, severe    Discharge Condition: Improved.  Diet recommendation:  Regular.  Wound care: None.  Code status: DNR   History of Present Illness:   Dylan Good is a 51 y.o. 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  He presents to the emergency department for evaluation of weakness and hypercalcemia.  During labs drawn today, calcium found to be greater than 15.0.  Patient presented to the cancer to center today for IV hydration.  Last seen in the emergency department on 02/12/22 for calcium 14.9, was treated with IV fluids and Zometa, discharged home at that time after discussion with oncology.  Patient complains of feeling generally weak.  He has had poor PO intake.      Hospital Course by Problem:   Hypercalcemia of malignancy Patient has received a dose of IV Zometa in the ER. Calcitonin given -oncology saw: Related to malignancy and dehydration -encouraged palliative care/hospice   Kidney cancer, primary, with metastasis from kidney to other site [facility]) With brain and lung metastasis. -poor overall prognosis   Hyponatremia -prob poor PO intake -improved    Hypokalemia/hypomagnesemia -replete   Hypertension Patient is on Norvasc-- stopped for low BP   Malnutrition Nutrition Status: Nutrition Problem: Severe Malnutrition Etiology: cancer and cancer related treatments, chronic illness Signs/Symptoms: moderate fat depletion, moderate muscle depletion, energy intake < or equal to 75% for > or equal to 1 month, percent weight loss Interventions: Ensure Enlive (each supplement provides 350kcal and 20 grams of protein), Boost Breeze      Medical Consultants:   oncology   Discharge Exam:   Vitals:   03/03/22 0330 03/03/22 0801  BP: 104/60   Pulse: 100   Resp: 18   Temp: 98.2 F (36.8 C)   SpO2: 93% 97%   Vitals:   03/02/22 2024 03/02/22 2304 03/03/22 0330 03/03/22 0801  BP:  102/66 104/60   Pulse:  (!) 102 100   Resp:  18 18   Temp:  98.4 F (36.9 C) 98.2 F (36.8 C)   TempSrc:  Oral Oral   SpO2: 97% 94% 93% 97%  Weight:      Height:        General exam: Appears calm and comfortable-- wants to go home   The results of significant diagnostics from this hospitalization (including imaging, microbiology, ancillary and laboratory) are listed below for reference.     Procedures and Diagnostic Studies:   No results found.   Labs:   Basic Metabolic Panel: Recent Labs  Lab 02/28/22 0341 02/28/22 2122 03/01/22 0604 03/02/22 0732 03/03/22 1131  NA 135 133* 135 131* 132*  K 3.9 4.7 3.3* 3.2* 3.8  CL 103 107 107 106 103  CO2 24 18* 20* 18* 21*  GLUCOSE 100* 92 82 91 106*  BUN 10 7 <5* <5* <5*  CREATININE 0.73 0.65 0.58* 0.50* 0.63  CALCIUM 13.4* 12.3* 12.3* 12.1* 12.5*  MG  --   --  1.0*  --  1.6*   GFR Estimated Creatinine Clearance: 94.8 mL/min (by C-G formula based on SCr of 0.63 mg/dL). Liver Function Tests: Recent Labs  Lab 02/27/22 1342 02/28/22 2122  AST 20 21  ALT 6 6  ALKPHOS 56 44  BILITOT 1.0 1.2  PROT 8.2* 6.6  ALBUMIN 4.0 3.1*   Recent Labs  Lab 02/28/22 0341  LIPASE 20   No  results for input(s): "AMMONIA" in the last 168 hours. Coagulation profile No results for input(s): "INR", "PROTIME" in the last 168 hours.  CBC: Recent Labs  Lab 02/27/22 1342 02/28/22 0341 03/01/22 0604 03/03/22 1131  WBC 4.5 3.2* 3.7* 3.9*  NEUTROABS 2.6  --   --   --   HGB 11.1* 10.3* 10.9* 10.2*  HCT 34.1* 32.5* 34.5* 32.5*  MCV 74.8* 77.0* 76.3* 75.9*  PLT 357 305 262 234   Cardiac Enzymes: No results for input(s): "CKTOTAL", "CKMB", "CKMBINDEX", "TROPONINI" in the last 168 hours. BNP: Invalid input(s): "POCBNP" CBG: No results for input(s): "GLUCAP" in the last 168 hours. D-Dimer No results for input(s): "DDIMER" in the last 72 hours. Hgb A1c No results for input(s): "HGBA1C" in the last 72 hours. Lipid Profile No results for input(s): "CHOL", "HDL", "LDLCALC", "TRIG", "CHOLHDL", "LDLDIRECT" in the last 72 hours. Thyroid function studies No results for input(s): "TSH", "T4TOTAL", "T3FREE", "THYROIDAB" in the last 72 hours.  Invalid input(s): "FREET3" Anemia work up No results for input(s): "VITAMINB12", "FOLATE", "FERRITIN", "TIBC", "IRON", "RETICCTPCT" in the last 72 hours. Microbiology No results found for this or any previous visit (from the past 240 hour(s)).   Discharge Instructions:   Discharge Instructions     Ambulatory referral to Physical Therapy   Complete by: As directed    Iontophoresis - 4 mg/ml of dexamethasone: No   T.E.N.S. Unit Evaluation and Dispense as Indicated: No   Diet general   Complete by: As directed    Discharge wound care:   Complete by: As directed    pressure injury to the coccyx:  Cleanse with NS, pat dry. Apply Medihoney, top with silicone foam. Change daily and PRN soiling.   Increase activity slowly   Complete by: As directed       Allergies as of 03/03/2022   No Known Allergies      Medication List     STOP taking these medications    amLODipine 5 MG tablet Commonly known as: NORVASC   metoprolol tartrate  25 MG tablet Commonly known as: LOPRESSOR       TAKE these medications    acetaminophen 325 MG tablet Commonly known as: TYLENOL Take 2 tablets (650 mg total) by mouth every 6 (six) hours as needed for mild pain (or Fever >/= 101).   B-complex with vitamin C tablet Take 1 tablet by mouth daily.   feeding supplement Liqd Take 237 mLs by mouth 2 (two) times daily between meals. What changed:  when to take this reasons to take this   HYDROmorphone 4 MG tablet Commonly known as: Dilaudid Take 1 tablet (4 mg total) by mouth every 4 (four) hours as needed for severe pain.   Inlyta 5 MG tablet Generic drug: axitinib Take 1 tablet (5 mg total) by mouth daily.   leptospermum manuka honey Pste paste Apply 1 Application topically daily.   lidocaine 5 %  Commonly known as: LIDODERM Place 2 patches onto the skin daily. Remove & Discard patch within 12 hours or as directed by MD   magnesium oxide 400 (240 Mg) MG tablet Commonly known as: MAG-OX Take 1 tablet (400 mg total) by mouth at bedtime.   multivitamin with minerals Tabs tablet Take 1 tablet by mouth daily.   ondansetron 4 MG tablet Commonly known as: ZOFRAN Take 1 tablet (4 mg total) by mouth 3 (three) times daily before meals. What changed:  when to take this reasons to take this   Oxycodone HCl 10 MG Tabs Take 10 mg by mouth every 4 (four) hours as needed (pain).   promethazine 25 MG suppository Commonly known as: PHENERGAN Place 1 suppository (25 mg total) rectally every 8 (eight) hours as needed for nausea or vomiting.   promethazine 25 MG tablet Commonly known as: PHENERGAN Take 1 tablet (25 mg total) by mouth every 8 (eight) hours as needed for up to 12 doses for nausea or vomiting.   Symbicort 80-4.5 MCG/ACT inhaler Generic drug: budesonide-formoterol Inhale 2 puffs into the lungs daily as needed (for shortness of breath).   albuterol (2.5 MG/3ML) 0.083% nebulizer solution Commonly known as:  PROVENTIL Take 2.5 mg by nebulization every 6 (six) hours as needed for wheezing or shortness of breath.   Ventolin HFA 108 (90 Base) MCG/ACT inhaler Generic drug: albuterol Inhale 2 puffs into the lungs every 4 (four) hours as needed for shortness of breath or wheezing.               Durable Medical Equipment  (From admission, onward)           Start     Ordered   03/01/22 1451  For home use only DME Walker rolling  Once       Question Answer Comment  Walker: With [place] Wheels   Patient needs a walker to treat with the following condition Difficulty walking      03/01/22 1451              Discharge Care Instructions  (From admission, onward)           Start     Ordered   03/03/22 0000  Discharge wound care:       Comments: pressure injury to the coccyx:  Cleanse with NS, pat dry. Apply Medihoney, top with silicone foam. Change daily and PRN soiling.   03/03/22 1223            Follow-up Information     [facility] Follow up.   Specialty: Rehabilitation Why: The office will call you to set up appt. Contact information: [address] [id number] mc [place] [phone]                 Time coordinating discharge: 45 min  Signed:  Geradine Girt DO  [facility] 03/03/2022, 12:23 PM

## 2022-03-03 NOTE — TOC Transition Note (Signed)
Transition of Care [facility]) - CM/SW Discharge Note   Patient Details  Name: Dylan Good MRN: [id number] Date of Birth: [birth year]  Transition of Care [facility]) CM/SW Contact:  Dessa Phi, RN Phone Number: 03/03/2022, 11:48 AM   Clinical Narrative:otpt PT referral set see d/c instructions No further TOC needs.       Final next level of care: OP Rehab Barriers to Discharge: No Barriers Identified   Patient Goals and CMS Choice        Discharge Placement                       Discharge Plan and Services                                     Social Determinants of Health (SDOH) Interventions     Readmission Risk Interventions    01/24/2022    9:23 AM 10/05/2021   12:10 PM 09/14/2021   10:34 AM  Readmission Risk Prevention Plan  Transportation Screening Complete Complete Complete  PCP or Specialist Appt within 3-5 Days   Complete  HRI or [place]   Complete  Social Work Consult for [place] Planning/Counseling   Complete  Palliative Care Screening   Complete  Medication Review [employer] photographer) Complete Complete Complete  PCP or Specialist appointment within 3-5 days of discharge Complete Complete   HRI or Home Care Consult Complete Complete   SW Recovery Care/Counseling Consult  Complete   Palliative Care Screening Complete Complete   [place] Not Applicable Not Applicable

## 2022-03-03 NOTE — TOC Transition Note (Signed)
Transition of Care [facility] - CM/SW Discharge Note   Patient Details  Name: Dylan Good MRN: [id number] Date of Birth: [birth year]  Transition of Care [facility]) CM/SW Contact:  Ross Ludwig, LCSW Phone Number: 03/03/2022, 2:54 PM   Clinical Narrative:     CSW received a phone call that patient needs a rolling walker.  CSW contacted [employer], and ordered the rolling walker.  Patient stated he did not want to wait for it, and would prefer to have it delivered to the home.  CSW informed [employer], they will deliver it to the home.   Final next level of care: Home/Self Care Barriers to Discharge: Barriers Resolved   Patient Goals and CMS Choice Patient states their goals for this hospitalization and ongoing recovery are:: To return back home.      Discharge Placement                       Discharge Plan and Services                DME Arranged: Walker rolling DME Agency: [employer] Date DME Agency Contacted: 03/03/22 Time DME Agency Contacted: [phone] Representative spoke with at DME Agency: [place] Interventions     Readmission Risk Interventions    01/24/2022    9:23 AM 10/05/2021   12:10 PM 09/14/2021   10:34 AM  Readmission Risk Prevention Plan  Transportation Screening Complete Complete Complete  PCP or Specialist Appt within 3-5 Days   Complete  HRI or [place]   Complete  Social Work Consult for [place] Planning/Counseling   Complete  Palliative Care Screening   Complete  Medication Review Press photographer) Complete Complete Complete  PCP or Specialist appointment within 3-5 days of discharge Complete Complete   HRI or Home Care Consult Complete Complete   SW Recovery Care/Counseling Consult  Complete   Palliative Care Screening Complete Complete   [place] Not Applicable Not Applicable

## 2022-03-03 NOTE — Progress Notes (Signed)
Discharge instructions explained to Dylan Good's girlfriend, she lives with him and takes care of him. Next medications due are written on the discharge papers.  Wound care done to sacral area. Cleanse with normal saline, medihoney applied and foam dressing applied. Case manager called to have walker delivered to [facility] house. Patient was discharged via wheel chair to girlfriend.

## 2022-03-05 ENCOUNTER — Other Ambulatory Visit (HOSPITAL_COMMUNITY): Payer: Self-pay

## 2022-03-07 ENCOUNTER — Other Ambulatory Visit (HOSPITAL_COMMUNITY): Payer: Self-pay

## 2022-03-08 ENCOUNTER — Other Ambulatory Visit (HOSPITAL_COMMUNITY): Payer: Self-pay

## 2022-03-12 ENCOUNTER — Other Ambulatory Visit: Payer: Self-pay | Admitting: *Deleted

## 2022-03-12 ENCOUNTER — Ambulatory Visit: Payer: Medicaid Other | Admitting: Physical Therapy

## 2022-03-12 ENCOUNTER — Other Ambulatory Visit (HOSPITAL_COMMUNITY): Payer: Self-pay

## 2022-03-12 DIAGNOSIS — C641 Malignant neoplasm of right kidney, except renal pelvis: Secondary | ICD-10-CM

## 2022-03-12 MED ORDER — HYDROMORPHONE HCL 4 MG PO TABS: 4.0000 mg | ORAL_TABLET | ORAL | 0 refills | Status: DC | PRN

## 2022-03-13 ENCOUNTER — Other Ambulatory Visit: Payer: Self-pay

## 2022-03-13 ENCOUNTER — Encounter (HOSPITAL_COMMUNITY): Payer: Self-pay | Admitting: *Deleted

## 2022-03-13 ENCOUNTER — Emergency Department (HOSPITAL_COMMUNITY): Payer: Medicaid Other

## 2022-03-13 ENCOUNTER — Other Ambulatory Visit (HOSPITAL_COMMUNITY): Payer: Self-pay

## 2022-03-13 ENCOUNTER — Inpatient Hospital Stay (HOSPITAL_COMMUNITY)
Admission: EM | Admit: 2022-03-13 | Discharge: 2022-03-16 | DRG: 193 | Disposition: A | Discharge status: Hospice | Payer: Medicaid Other | Attending: Family Medicine | Admitting: Family Medicine

## 2022-03-13 DIAGNOSIS — D509 Iron deficiency anemia, unspecified: Secondary | ICD-10-CM | POA: Diagnosis present

## 2022-03-13 DIAGNOSIS — E871 Hypo-osmolality and hyponatremia: Secondary | ICD-10-CM | POA: Diagnosis present

## 2022-03-13 DIAGNOSIS — C7801 Secondary malignant neoplasm of right lung: Secondary | ICD-10-CM | POA: Diagnosis present

## 2022-03-13 DIAGNOSIS — C7931 Secondary malignant neoplasm of brain: Secondary | ICD-10-CM | POA: Diagnosis present

## 2022-03-13 DIAGNOSIS — C7802 Secondary malignant neoplasm of left lung: Secondary | ICD-10-CM | POA: Diagnosis present

## 2022-03-13 DIAGNOSIS — Z9981 Dependence on supplemental oxygen: Secondary | ICD-10-CM

## 2022-03-13 DIAGNOSIS — E440 Moderate protein-calorie malnutrition: Secondary | ICD-10-CM | POA: Diagnosis present

## 2022-03-13 DIAGNOSIS — C7951 Secondary malignant neoplasm of bone: Secondary | ICD-10-CM | POA: Diagnosis present

## 2022-03-13 DIAGNOSIS — Z87891 Personal history of nicotine dependence: Secondary | ICD-10-CM

## 2022-03-13 DIAGNOSIS — J9611 Chronic respiratory failure with hypoxia: Secondary | ICD-10-CM | POA: Diagnosis present

## 2022-03-13 DIAGNOSIS — Z79899 Other long term (current) drug therapy: Secondary | ICD-10-CM

## 2022-03-13 DIAGNOSIS — C7972 Secondary malignant neoplasm of left adrenal gland: Secondary | ICD-10-CM | POA: Diagnosis present

## 2022-03-13 DIAGNOSIS — C642 Malignant neoplasm of left kidney, except renal pelvis: Secondary | ICD-10-CM | POA: Diagnosis present

## 2022-03-13 DIAGNOSIS — R64 Cachexia: Secondary | ICD-10-CM | POA: Diagnosis present

## 2022-03-13 DIAGNOSIS — I10 Essential (primary) hypertension: Secondary | ICD-10-CM | POA: Diagnosis present

## 2022-03-13 DIAGNOSIS — Z515 Encounter for palliative care: Secondary | ICD-10-CM

## 2022-03-13 DIAGNOSIS — J90 Pleural effusion, not elsewhere classified: Secondary | ICD-10-CM | POA: Diagnosis present

## 2022-03-13 DIAGNOSIS — Y95 Nosocomial condition: Secondary | ICD-10-CM | POA: Diagnosis present

## 2022-03-13 DIAGNOSIS — C7800 Secondary malignant neoplasm of unspecified lung: Secondary | ICD-10-CM | POA: Diagnosis present

## 2022-03-13 DIAGNOSIS — K219 Gastro-esophageal reflux disease without esophagitis: Secondary | ICD-10-CM | POA: Diagnosis present

## 2022-03-13 DIAGNOSIS — Z66 Do not resuscitate: Secondary | ICD-10-CM | POA: Diagnosis present

## 2022-03-13 DIAGNOSIS — Z978 Presence of other specified devices: Secondary | ICD-10-CM

## 2022-03-13 DIAGNOSIS — J189 Pneumonia, unspecified organism: Secondary | ICD-10-CM | POA: Diagnosis present

## 2022-03-13 DIAGNOSIS — R1310 Dysphagia, unspecified: Secondary | ICD-10-CM | POA: Diagnosis present

## 2022-03-13 DIAGNOSIS — C641 Malignant neoplasm of right kidney, except renal pelvis: Secondary | ICD-10-CM | POA: Diagnosis present

## 2022-03-13 DIAGNOSIS — R4182 Altered mental status, unspecified: Secondary | ICD-10-CM | POA: Diagnosis not present

## 2022-03-13 DIAGNOSIS — D638 Anemia in other chronic diseases classified elsewhere: Secondary | ICD-10-CM | POA: Diagnosis present

## 2022-03-13 DIAGNOSIS — C7971 Secondary malignant neoplasm of right adrenal gland: Secondary | ICD-10-CM | POA: Diagnosis present

## 2022-03-13 DIAGNOSIS — R000 Tachycardia, unspecified: Secondary | ICD-10-CM | POA: Diagnosis present

## 2022-03-13 DIAGNOSIS — Y92230 Patient room in hospital as the place of occurrence of the external cause: Secondary | ICD-10-CM | POA: Diagnosis not present

## 2022-03-13 DIAGNOSIS — Z681 Body mass index (BMI) 19 or less, adult: Secondary | ICD-10-CM

## 2022-03-13 DIAGNOSIS — F419 Anxiety disorder, unspecified: Secondary | ICD-10-CM | POA: Diagnosis present

## 2022-03-13 DIAGNOSIS — R202 Paresthesia of skin: Secondary | ICD-10-CM | POA: Diagnosis present

## 2022-03-13 DIAGNOSIS — R4781 Slurred speech: Secondary | ICD-10-CM | POA: Diagnosis present

## 2022-03-13 DIAGNOSIS — G928 Other toxic encephalopathy: Secondary | ICD-10-CM | POA: Diagnosis not present

## 2022-03-13 DIAGNOSIS — Z809 Family history of malignant neoplasm, unspecified: Secondary | ICD-10-CM

## 2022-03-13 DIAGNOSIS — E876 Hypokalemia: Secondary | ICD-10-CM | POA: Diagnosis present

## 2022-03-13 DIAGNOSIS — G893 Neoplasm related pain (acute) (chronic): Secondary | ICD-10-CM | POA: Diagnosis not present

## 2022-03-13 DIAGNOSIS — Z20822 Contact with and (suspected) exposure to covid-19: Secondary | ICD-10-CM | POA: Diagnosis not present

## 2022-03-13 DIAGNOSIS — C649 Malignant neoplasm of unspecified kidney, except renal pelvis: Secondary | ICD-10-CM | POA: Diagnosis present

## 2022-03-13 DIAGNOSIS — Z7951 Long term (current) use of inhaled steroids: Secondary | ICD-10-CM

## 2022-03-13 DIAGNOSIS — R471 Dysarthria and anarthria: Secondary | ICD-10-CM | POA: Diagnosis present

## 2022-03-13 DIAGNOSIS — Z832 Family history of diseases of the blood and blood-forming organs and certain disorders involving the immune mechanism: Secondary | ICD-10-CM

## 2022-03-13 DIAGNOSIS — T426X5A Adverse effect of other antiepileptic and sedative-hypnotic drugs, initial encounter: Secondary | ICD-10-CM | POA: Diagnosis not present

## 2022-03-13 LAB — CBC WITH DIFFERENTIAL/PLATELET
Abs Immature Granulocytes: 0.01 10*3/uL (ref 0.00–0.07)
Basophils Absolute: 0 10*3/uL (ref 0.0–0.1)
Basophils Relative: 1 %
Eosinophils Absolute: 0.1 10*3/uL (ref 0.0–0.5)
Eosinophils Relative: 3 %
HCT: 35.9 % — ABNORMAL LOW (ref 39.0–52.0)
Hemoglobin: 11.1 g/dL — ABNORMAL LOW (ref 13.0–17.0)
Immature Granulocytes: 0 %
Lymphocytes Relative: 14 %
Lymphs Abs: 0.5 10*3/uL — ABNORMAL LOW (ref 0.7–4.0)
MCH: 24.2 pg — ABNORMAL LOW (ref 26.0–34.0)
MCHC: 30.9 g/dL (ref 30.0–36.0)
MCV: 78.2 fL — ABNORMAL LOW (ref 80.0–100.0)
Monocytes Absolute: 0.6 10*3/uL (ref 0.1–1.0)
Monocytes Relative: 16 %
Neutro Abs: 2.7 10*3/uL (ref 1.7–7.7)
Neutrophils Relative %: 66 %
Platelets: 327 10*3/uL (ref 150–400)
RBC: 4.59 MIL/uL (ref 4.22–5.81)
RDW: 15.9 % — ABNORMAL HIGH (ref 11.5–15.5)
WBC: 4 10*3/uL (ref 4.0–10.5)
nRBC: 0 % (ref 0.0–0.2)

## 2022-03-13 LAB — COMPREHENSIVE METABOLIC PANEL
ALT: 8 U/L (ref 0–44)
AST: 19 U/L (ref 15–41)
Albumin: 3.8 g/dL (ref 3.5–5.0)
Alkaline Phosphatase: 48 U/L (ref 38–126)
Anion gap: 10 (ref 5–15)
BUN: 10 mg/dL (ref 6–20)
CO2: 22 mmol/L (ref 22–32)
Calcium: 14 mg/dL (ref 8.9–10.3)
Chloride: 99 mmol/L (ref 98–111)
Creatinine, Ser: 0.59 mg/dL — ABNORMAL LOW (ref 0.61–1.24)
GFR, Estimated: >60 mL/min (ref >60)
Glucose, Bld: 116 mg/dL — ABNORMAL HIGH (ref 70–99)
Potassium: 4.2 mmol/L (ref 3.5–5.1)
Sodium: 131 mmol/L — ABNORMAL LOW (ref 135–145)
Total Bilirubin: 0.8 mg/dL (ref 0.3–1.2)
Total Protein: 7.8 g/dL (ref 6.5–8.1)

## 2022-03-13 LAB — BASIC METABOLIC PANEL
Anion gap: 8 (ref 5–15)
BUN: 8 mg/dL (ref 6–20)
CO2: 20 mmol/L — ABNORMAL LOW (ref 22–32)
Calcium: 12.5 mg/dL — ABNORMAL HIGH (ref 8.9–10.3)
Chloride: 106 mmol/L (ref 98–111)
Creatinine, Ser: 0.42 mg/dL — ABNORMAL LOW (ref 0.61–1.24)
GFR, Estimated: >60 mL/min (ref >60)
Glucose, Bld: 88 mg/dL (ref 70–99)
Potassium: 4.1 mmol/L (ref 3.5–5.1)
Sodium: 134 mmol/L — ABNORMAL LOW (ref 135–145)

## 2022-03-13 LAB — LACTIC ACID, PLASMA: Lactic Acid, Venous: 1.7 mmol/L (ref 0.5–1.9)

## 2022-03-13 LAB — MAGNESIUM: Magnesium: 1.9 mg/dL (ref 1.7–2.4)

## 2022-03-13 LAB — BRAIN NATRIURETIC PEPTIDE: B Natriuretic Peptide: 17.7 pg/mL (ref 0.0–100.0)

## 2022-03-13 LAB — PHOSPHORUS: Phosphorus: 1.8 mg/dL — ABNORMAL LOW (ref 2.5–4.6)

## 2022-03-13 LAB — SARS CORONAVIRUS 2 BY RT PCR: SARS Coronavirus 2 by RT PCR: NEGATIVE

## 2022-03-13 MED ORDER — ACETAMINOPHEN 650 MG RE SUPP: 650.0000 mg | Freq: Four times a day (QID) | RECTAL | Status: DC | PRN

## 2022-03-13 MED ORDER — MORPHINE SULFATE (PF) 4 MG/ML IV SOLN
4.0000 mg | Freq: Once | INTRAVENOUS | Status: AC
  Administered 2022-03-13: 4 mg via INTRAVENOUS
  Filled 2022-03-13: qty 1

## 2022-03-13 MED ORDER — HYDROMORPHONE HCL 2 MG PO TABS
4.0000 mg | ORAL_TABLET | ORAL | Status: DC | PRN
  Administered 2022-03-14 – 2022-03-15 (×3): 4 mg via ORAL
  Filled 2022-03-13 (×3): qty 2

## 2022-03-13 MED ORDER — ACETAMINOPHEN 325 MG PO TABS
650.0000 mg | ORAL_TABLET | Freq: Four times a day (QID) | ORAL | Status: DC | PRN
  Administered 2022-03-14: 650 mg via ORAL
  Filled 2022-03-13: qty 2

## 2022-03-13 MED ORDER — SODIUM CHLORIDE 0.9 % IV BOLUS
1000.0000 mL | Freq: Once | INTRAVENOUS | Status: AC
  Administered 2022-03-13: 1000 mL via INTRAVENOUS

## 2022-03-13 MED ORDER — MOMETASONE FURO-FORMOTEROL FUM 100-5 MCG/ACT IN AERO
2.0000 | INHALATION_SPRAY | Freq: Two times a day (BID) | RESPIRATORY_TRACT | Status: DC
  Administered 2022-03-14 – 2022-03-16 (×5): 2 via RESPIRATORY_TRACT
  Filled 2022-03-13: qty 8.8

## 2022-03-13 MED ORDER — VANCOMYCIN HCL 1750 MG/350ML IV SOLN
1750.0000 mg | INTRAVENOUS | Status: DC
  Filled 2022-03-13: qty 350

## 2022-03-13 MED ORDER — ONDANSETRON HCL 4 MG PO TABS: 4.0000 mg | ORAL_TABLET | Freq: Four times a day (QID) | ORAL | Status: DC | PRN

## 2022-03-13 MED ORDER — LEVALBUTEROL HCL 1.25 MG/0.5ML IN NEBU
1.2500 mg | INHALATION_SOLUTION | Freq: Three times a day (TID) | RESPIRATORY_TRACT | Status: DC
  Administered 2022-03-14: 1.25 mg via RESPIRATORY_TRACT
  Filled 2022-03-13: qty 0.5

## 2022-03-13 MED ORDER — MAGNESIUM OXIDE -MG SUPPLEMENT 400 (240 MG) MG PO TABS
400.0000 mg | ORAL_TABLET | Freq: Every day | ORAL | Status: DC
  Administered 2022-03-14 – 2022-03-15 (×3): 400 mg via ORAL
  Filled 2022-03-13 (×3): qty 1

## 2022-03-13 MED ORDER — SODIUM CHLORIDE 0.9 % IV SOLN
2.0000 g | Freq: Once | INTRAVENOUS | Status: AC
  Administered 2022-03-13: 2 g via INTRAVENOUS
  Filled 2022-03-13: qty 12.5

## 2022-03-13 MED ORDER — METRONIDAZOLE 500 MG/100ML IV SOLN
500.0000 mg | Freq: Two times a day (BID) | INTRAVENOUS | Status: DC
  Administered 2022-03-13 – 2022-03-14 (×3): 500 mg via INTRAVENOUS
  Filled 2022-03-13 (×3): qty 100

## 2022-03-13 MED ORDER — ENSURE ENLIVE PO LIQD: 237.0000 mL | Freq: Two times a day (BID) | ORAL | Status: DC | PRN

## 2022-03-13 MED ORDER — CALCITONIN (SALMON) 200 UNIT/ML IJ SOLN
4.0000 [IU]/kg | Freq: Two times a day (BID) | INTRAMUSCULAR | Status: AC
  Administered 2022-03-13 – 2022-03-15 (×4): 240 [IU] via SUBCUTANEOUS
  Filled 2022-03-13 (×5): qty 1.2

## 2022-03-13 MED ORDER — ZOLEDRONIC ACID 4 MG/5ML IV CONC
4.0000 mg | Freq: Once | INTRAVENOUS | Status: AC
  Administered 2022-03-13: 4 mg via INTRAVENOUS
  Filled 2022-03-13: qty 5

## 2022-03-13 MED ORDER — K PHOS MONO-SOD PHOS DI & MONO 155-852-130 MG PO TABS
500.0000 mg | ORAL_TABLET | Freq: Four times a day (QID) | ORAL | Status: AC
  Administered 2022-03-13 – 2022-03-14 (×3): 500 mg via ORAL
  Filled 2022-03-13 (×4): qty 2

## 2022-03-13 MED ORDER — VANCOMYCIN HCL 1500 MG/300ML IV SOLN
1500.0000 mg | Freq: Once | INTRAVENOUS | Status: AC
  Administered 2022-03-13: 1500 mg via INTRAVENOUS
  Filled 2022-03-13: qty 300

## 2022-03-13 MED ORDER — LIDOCAINE 5 % EX PTCH
2.0000 | MEDICATED_PATCH | Freq: Every day | CUTANEOUS | Status: DC
  Administered 2022-03-14 – 2022-03-16 (×3): 2 via TRANSDERMAL
  Filled 2022-03-13 (×3): qty 2

## 2022-03-13 MED ORDER — LEVALBUTEROL HCL 1.25 MG/0.5ML IN NEBU
1.2500 mg | INHALATION_SOLUTION | Freq: Four times a day (QID) | RESPIRATORY_TRACT | Status: DC
  Administered 2022-03-13: 1.25 mg via RESPIRATORY_TRACT
  Filled 2022-03-13: qty 0.5

## 2022-03-13 MED ORDER — AXITINIB 5 MG PO TABS: 5.0000 mg | ORAL_TABLET | Freq: Every day | ORAL | Status: DC

## 2022-03-13 MED ORDER — HYDROMORPHONE HCL 1 MG/ML IJ SOLN
1.0000 mg | INTRAMUSCULAR | Status: DC | PRN
  Administered 2022-03-13: 1 mg via INTRAVENOUS
  Filled 2022-03-13: qty 1

## 2022-03-13 MED ORDER — ENOXAPARIN SODIUM 40 MG/0.4ML IJ SOSY: 40.0000 mg | PREFILLED_SYRINGE | INTRAMUSCULAR | Status: DC

## 2022-03-13 MED ORDER — ONDANSETRON HCL 4 MG/2ML IJ SOLN
4.0000 mg | Freq: Four times a day (QID) | INTRAMUSCULAR | Status: DC | PRN
  Administered 2022-03-14 – 2022-03-16 (×3): 4 mg via INTRAVENOUS
  Filled 2022-03-13 (×3): qty 2

## 2022-03-13 MED ORDER — IOHEXOL 350 MG/ML SOLN
100.0000 mL | Freq: Once | INTRAVENOUS | Status: AC | PRN
  Administered 2022-03-13: 60 mL via INTRAVENOUS

## 2022-03-13 MED ORDER — SODIUM CHLORIDE 0.9 % IV SOLN: INTRAVENOUS | Status: DC

## 2022-03-13 MED ORDER — CEFEPIME HCL 2 G IV SOLR
2.0000 g | Freq: Three times a day (TID) | INTRAVENOUS | Status: DC
  Administered 2022-03-14 – 2022-03-16 (×8): 2 g via INTRAVENOUS
  Filled 2022-03-13 (×10): qty 12.5

## 2022-03-13 MED ORDER — DM-GUAIFENESIN ER 30-600 MG PO TB12
1.0000 | ORAL_TABLET | Freq: Two times a day (BID) | ORAL | Status: DC
Start: 2022-03-13 — End: 2022-03-16
  Administered 2022-03-14 – 2022-03-16 (×6): 1 via ORAL
  Filled 2022-03-13 (×6): qty 1

## 2022-03-13 NOTE — Progress Notes (Signed)
Pharmacy Antibiotic Note  Dylan Good is a 51 y.o. male admitted on 03/13/2022 with  HCAP .  Pharmacy has been consulted for vancomycin dosing.  In ED: 9/6 1425 vancomycin 1500 mg IV  In addition to vancomycin, provider has ordered cefepime 2 g IV every 8 hours and metronidazole 500 mg IV every 12 hours.  Plan: Continue vancomycin at 1750 mg IV every 24 hours (Goal AUC 400-550. Expected AUC: 487.6. SCr used: rounded up to 0.8) Monitor clinical progress, renal function, vancomycin levels as indicated F/U MRSA PCR, C&S, abx deescalation / LOT       Temp (24hrs), Avg:98.5 F (36.9 C), Min:98.5 F (36.9 C), Max:98.5 F (36.9 C)  Recent Labs  Lab 03/13/22 0954  WBC 4.0  CREATININE 0.59*  LATICACIDVEN 1.7    CrCl cannot be calculated (Unknown ideal weight.).    No Known Allergies  Antimicrobials this admission: 9/6 cefepime >> 9/6 Flagyl >> 9/6 vancomycin    Microbiology results: 9/6 Sputum: ordered  9/6 MRSA PCR: ordered  Thank you for allowing pharmacy to be a part of this patient's care.  Suzzanne Cloud, PharmD, BCPS 03/13/2022 3:40 PM

## 2022-03-13 NOTE — ED Triage Notes (Addendum)
Presents with [facility], has recently had scope on his lung, procedure. Has kidney cancer. Proceeds to state has abd pain as well. Pt is on 2.3 L O2 all the time

## 2022-03-13 NOTE — Progress Notes (Addendum)
Axitinib (Inlyta) hold criteria: AST or ALT >3x ULN Bili > 1.5x ULN Acute arterial or venous thromboembolic event Acute hemorrhagic event Gastrointestinal perforation or fistula Hand / foot syndrome - Grade 2 or higher Proteinuria - Grade 2 or higher Surgery planned this admission Uncontrolled hypertension Unexplained neuro signs/symptoms: seizure, visual disturbance, lethargy, confusion, headache Active infection   Axitinib being held per protocol due to active infection (patient being treated with antibiotics for healthcare-associated pneumonia).    Lindell Spar, PharmD, BCPS 03/13/2022 9:26 PM

## 2022-03-13 NOTE — ED Provider Notes (Signed)
[facility] DEPT Provider Note   CSN: [id number] Arrival date & time: 03/13/22  4401     History  Chief Complaint  Patient presents with   Shortness of Breath    Dylan Good is a 51 y.o. male.  HPI Patient presents with his girlfriend who assists with the history.  Patient has a history of metastatic renal cancer with lung involvement.  He was discharged about 1 week ago, notes that he was transiently better, but developed increased work of breathing and difficulty with this over the past few days.  Patient is on oxygen 24/7 now, whereas he was not prior to his last admission.  He notes that his history includes prior stenting of his bronchi due to respiratory difficulty secondary to metastatic spread.  No fever, no focal pain, no relief with anything.    Home Medications Prior to Admission medications   Medication Sig Start Date End Date Taking? Authorizing Provider  axitinib (INLYTA) 5 MG tablet Take 1 tablet (5 mg total) by mouth daily. 02/08/22  Yes [name], MD  feeding supplement (ENSURE ENLIVE / ENSURE PLUS) LIQD Take 237 mLs by mouth 2 (two) times daily between meals. Patient taking differently: Take 237 mLs by mouth 2 (two) times daily as needed (nutrition). 01/26/22  Yes Swayze, Ava, DO  HYDROmorphone (DILAUDID) 4 MG tablet Take 1 tablet (4 mg total) by mouth every 4 (four) hours as needed for severe pain. 03/12/22  Yes Shadad, [name], MD  leptospermum manuka honey (MEDIHONEY) PSTE paste Apply 1 Application topically daily. 03/03/22  Yes Vann, Jessica U, DO  lidocaine (LIDODERM) 5 % Place 2 patches onto the skin daily. Remove & Discard patch within 12 hours or as directed by MD 01/26/22  Yes Swayze, Ava, DO  magnesium oxide (MAG-OX) 400 (240 Mg) MG tablet Take 1 tablet (400 mg total) by mouth at bedtime. 03/03/22  Yes Vann, Jessica U, DO  ondansetron (ZOFRAN) 4 MG tablet Take 1 tablet (4 mg total) by mouth 3 (three) times daily before meals.  03/03/22  Yes Vann, Jessica U, DO  SYMBICORT 80-4.5 MCG/ACT inhaler Inhale 2 puffs into the lungs daily as needed (for shortness of breath). 11/12/21  Yes [provider]  VENTOLIN HFA 108 (90 Base) MCG/ACT inhaler Inhale 2 puffs into the lungs every 4 (four) hours as needed for shortness of breath or wheezing. 09/11/21  Yes [provider]  promethazine (PHENERGAN) 25 MG suppository Place 1 suppository (25 mg total) rectally every 8 (eight) hours as needed for nausea or vomiting. Patient not taking: Reported on 03/13/2022 02/02/22   Wyvonnia Dusky, MD  promethazine (PHENERGAN) 25 MG tablet Take 1 tablet (25 mg total) by mouth every 8 (eight) hours as needed for up to 12 doses for nausea or vomiting. Patient not taking: Reported on 03/13/2022 02/02/22   Wyvonnia Dusky, MD      Allergies    Patient has no known allergies.    Review of Systems   Review of Systems  All other systems reviewed and are negative.   Physical Exam Updated Vital Signs BP 127/69   Pulse (!) 118   Temp 98.5 F (36.9 C) (Oral)   Resp (!) 29   SpO2 100%  Physical Exam Vitals and nursing note reviewed.  Constitutional:      Appearance: He is ill-appearing.  HENT:     Head: Normocephalic and atraumatic.  Eyes:     Conjunctiva/sclera: Conjunctivae normal.  Cardiovascular:  Rate and Rhythm: Regular rhythm. Tachycardia present.  Pulmonary:     Effort: Tachypnea and accessory muscle usage present.     Breath sounds: Wheezing present.  Abdominal:     General: There is no distension.  Skin:    General: Skin is warm and dry.  Neurological:     Mental Status: He is alert and oriented to person, place, and time.     ED Results / Procedures / Treatments   Labs (all labs ordered are listed, but only abnormal results are displayed) Labs Reviewed  COMPREHENSIVE METABOLIC PANEL - Abnormal; Notable for the following components:      Result Value   Sodium 131 (*)    Glucose, Bld 116 (*)     Creatinine, Ser 0.59 (*)    Calcium 14.0 (*)    All other components within normal limits  CBC WITH DIFFERENTIAL/PLATELET - Abnormal; Notable for the following components:   Hemoglobin 11.1 (*)    HCT 35.9 (*)    MCV 78.2 (*)    MCH 24.2 (*)    RDW 15.9 (*)    Lymphs Abs 0.5 (*)    All other components within normal limits  LACTIC ACID, PLASMA  BRAIN NATRIURETIC PEPTIDE  LACTIC ACID, PLASMA  CALCIUM, IONIZED      Radiology CT Angio Chest PE W/Cm &/Or Wo Cm  Result Date: 03/13/2022 CLINICAL DATA:  PE suspected, dyspnea, metastatic renal cell carcinoma * Tracking Code: BO * EXAM: CT ANGIOGRAPHY CHEST WITH CONTRAST TECHNIQUE: Multidetector CT imaging of the chest was performed using the standard protocol during bolus administration of intravenous contrast. Multiplanar CT image reconstructions and MIPs were obtained to evaluate the vascular anatomy. RADIATION DOSE REDUCTION: This exam was performed according to the departmental dose-optimization program which includes automated exposure control, adjustment of the mA and/or kV according to patient size and/or use of iterative reconstruction technique. CONTRAST:  27mL OMNIPAQUE IOHEXOL 350 MG/ML SOLN COMPARISON:  01/02/2022 FINDINGS: Cardiovascular: Satisfactory opacification of the pulmonary arteries to the segmental level. No evidence of pulmonary embolism. Normal heart size. No pericardial effusion. Mediastinum/Nodes: Similar matted appearing enlarged lymph nodes and soft tissue throughout the mediastinum and hila. Thyroid gland, trachea, and esophagus demonstrate no significant findings. Lungs/Pleura: Right mainstem bronchus stent unchanged. Multiple segmental bronchi are obstructed by nodules, particularly in the left lower lobe (series 7, image 82). Innumerable small bilateral pulmonary nodules, many of which are increased in size, for example in the inferior right upper lobe measuring 1.6 x 1.5 cm, previously 1.0 x 0.9 cm (series 7, image 54)  and in the inferior lingula measuring 1.3 x 1.2 cm, previously 1.1 x 0.7 cm (series 7, image 101). Extensive new heterogeneous and consolidative airspace opacity in the peripheral left lower lobe (series 7, image 78). Small, left greater than right pleural effusions with numerous enhancing left-sided pleural nodules, similar to prior (series 5, image 134). Upper Abdomen: No acute abnormality. Partially imaged, very large, heterogeneous mass of the right kidney (series 5, image 170) as well as large bilateral adrenal metastases. Musculoskeletal: Unchanged, large, expansile soft tissue mass arising from the lateral left fifth rib is unchanged, measuring 7.2 x 4.8 cm (series 5, image 76). Interval enlargement of a lytic soft tissue nodule of the posterior right tenth rib, measuring 2.7 x 2.0 cm, previously 1.7 x 1.1 cm (series 5, image 146). Interval enlargement of a lytic soft tissue nodule of the posterior right fourth rib measuring 2.2 x 1.4 cm, previously 1.2 x 0.8 cm (series 6,  image 55). Review of the MIP images confirms the above findings. IMPRESSION: 1. Negative examination for pulmonary embolism. 2. Extensive new heterogeneous and consolidative airspace opacity in the peripheral left lower lobe, consistent with infection or aspiration. This may to some extent reflect postobstructive airspace disease given the occlusion of multiple segmental bronchi by pulmonary nodules in the central left lower lobe. 3. Innumerable small bilateral pulmonary nodules, many of which are increased in size, consistent with worsened pulmonary metastatic disease. 4. Small, left greater than right pleural effusions with numerous enhancing left-sided metastatic pleural nodules, similar to prior. 5. Unchanged large, expansile lytic mass arising from the lateral left fifth rib. There is however interval enlargement of lytic soft tissue nodules of the posterior right fourth and tenth ribs, consistent with worsened osseous metastatic  disease. 6. Partially imaged, very large, heterogeneous mass of the right kidney as well as large bilateral adrenal metastases. Electronically Signed   By: Delanna Ahmadi M.D.   On: 03/13/2022 12:13   DG Chest 2 View  Result Date: 03/13/2022 CLINICAL DATA:  Shortness of breath. History of renal cell carcinoma. EXAM: CHEST - 2 VIEW COMPARISON:  CT 01/02/2022, chest radiograph 11/24/2021 FINDINGS: Heart size is normal. Stable small left pleural effusion. No pneumothorax identified. Large left chest wall mass is identified in appears similar to the previous exam from 11/24/2021. Multiple bilateral lung nodules are noted which subjectively appears similar to the exam from 01/02/2022. there is mild superimposed increase interstitial prominence which may reflect mild interstitial edema versus lymphangitic spread of disease. IMPRESSION: 1. Stable small left pleural effusion. 2. Mild increase interstitial prominence as described above. Differential considerations include lymphangitic spread of tumor versus mild interstitial edema 3. Stable large left chest wall mass edema and smaller bilateral pulmonary metastasis. This appears grossly unchanged from CT dated 01/02/2022. Electronically Signed   By: Kerby Moors M.D.   On: 03/13/2022 10:31    Procedures Procedures    Medications Ordered in ED Medications  zoledronic acid (ZOMETA) 4 mg in sodium chloride 0.9 % 100 mL IVPB (has no administration in time range)  ceFEPIme (MAXIPIME) 1 g in sodium chloride 0.9 % 100 mL IVPB (has no administration in time range)  sodium chloride 0.9 % bolus 1,000 mL (1,000 mLs Intravenous New Bag/Given 03/13/22 1157)  morphine (PF) 4 MG/ML injection 4 mg (4 mg Intravenous Given 03/13/22 1158)  iohexol (OMNIPAQUE) 350 MG/ML injection 100 mL (60 mLs Intravenous Contrast Given 03/13/22 1141)    ED Course/ Medical Decision Making/ A&P This patient with a Hx of metastatic renal cancer now on oral chemotherapy with prior stenting of his  bronchi presents to the ED for concern of dyspnea, fatigue, this involves an extensive number of treatment options, and is a complaint that carries with it a high risk of complications and morbidity.    The differential diagnosis includes PE, progression of disease, pneumonia, collapse of prior stent   Social Determinants of Health:  Active cancer therapy  Additional history obtained:  Additional history and/or information obtained from girlfriend at bedside and oncology notes, notable for details included in HPI   After the initial evaluation, orders, including: Labs x-ray continuous oxygen were initiated.   Patient placed on Cardiac and Pulse-Oximetry Monitors. The patient was maintained on a cardiac monitor.  The cardiac monitored showed an rhythm of 120 sinus tach abnormal The patient was also maintained on pulse oximetry. The readings were typically 100% nasal cannula 2 L abnormal   On repeat evaluation of the patient  stayed the same  Lab Tests:  I personally interpreted labs.  The pertinent results include: Calcium 15, and is pending, similar to that of prior admission  Imaging Studies ordered:  I independently visualized and interpreted imaging which showed with consideration of active cancer, dyspnea, angiography was performed.  CT angiography suggests postobstructive pneumonia, patent stent, progression of disease, no pulmonary embolism I agree with the radiologist interpretation  Consultations Obtained:  I requested consultation with the hospitalist,  and discussed lab and imaging findings as well as pertinent plan - they recommend: Admission  Dispostion / Final MDM:  After consideration of the diagnostic results and the patient's response to treatment, adult male with metastatic renal cell malignancy presents with dyspnea, fatigue, was found to have likely postobstructive pneumonia, complicated by hypercalcemia.  Patient has multiple substantial abnormalities, all in  the context of relative immunocompromised state.  Patient started on broad-spectrum antibiotics, initiation of Zometa for hypercalcemia, required admission for further monitoring, management.  Final Clinical Impression(s) / ED Diagnoses Final diagnoses:  HCAP (healthcare-associated pneumonia)  Hypercalcemia  Malignant neoplasm metastatic to bone [facility])    CRITICAL CARE Performed by: Carmin Muskrat Total critical care time: 35 minutes Critical care time was exclusive of separately billable procedures and treating other patients. Critical care was necessary to treat or prevent imminent or life-threatening deterioration. Critical care was time spent personally by me on the following activities: development of treatment plan with patient and/or surrogate as well as nursing, discussions with consultants, evaluation of patient's response to treatment, examination of patient, obtaining history from patient or surrogate, ordering and performing treatments and interventions, ordering and review of laboratory studies, ordering and review of radiographic studies, pulse oximetry and re-evaluation of patient's condition.    Carmin Muskrat, MD 03/13/22 1231

## 2022-03-13 NOTE — H&P (Signed)
History and Physical    Patient: Dylan Good GHW:[id number] DOB: 23-Sep-1970 DOA: 03/13/2022 DOS: the patient was seen and examined on 03/13/2022 PCP: Dorise Hiss, PA-C  Patient coming from: Home  Chief Complaint:  Chief Complaint  Patient presents with   Shortness of Breath   HPI: Dylan Good is a 51 y.o. 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  He has some emesis due to persistent cough.  His appetite is significantly decreased.  He has had trouble sleeping because of cough and the chest wall pain. He denied fever, chills, rhinorrhea, sore throat, wheezing or hemoptysis.  No palpitations, diaphoresis, PND, orthopnea or pitting edema of the lower extremities.  No abdominal pain, diarrhea, melena or hematochezia.  No flank pain, dysuria, frequency or hematuria.  No polyuria, polydipsia, polyphagia or blurred vision.   ED course: Initial vital signs were temperature 98.5 F, pulse 90 and 39, respirations 18, but has been mostly in the mid to high 20s while in the emergency department adding O2 sat 100% on 2 LPM via nasal cannula.  The patient received cefepime, vancomycin, 4 mg of zoledronic acid and 1000 mL of normal saline bolus.  Lab work: CBC*white count of 4.0 with 66% neutrophils, hemoglobin 11.1 g/dL with an MCV of 78.2 fL and platelets 327.  Lactic acid was normal.  BNP 17.7 pg/mL.  Magnesium was 1.9 and phosphorus 1.8 mg/dL.  CMP showed a sodium 131 mmol/L, glucose of 116, creatinine 0.59 and calcium of 14.0 mg/dL.  The rest of the CMP measurements were normal.  Imaging:  Two-view chest radiograph shows stable small left pleural effusion.  There is mild increase in interstitial prominence and a stable large left chest wall mass edema and smaller bilateral pulmonary metastasis.  CTA chest showed no PE.  There is extensive new atherogenesis and consolidative airspace opacity in the peripheral of the left lower lobe consistent with infection or aspiration.  Innumerable small bilateral pulmonary nodules, many of which are increased in size, consistent with worsening pulmonary metastatic disease.  Small, left greater than right, pleural effusions with numerous enhancing left-sided metastatic pleural nodules.  Please see images and full radiology report for further details.   Review of Systems: As mentioned in the history of present illness. All other systems reviewed and are negative.  Past Medical History:  Diagnosis Date   Cancer, metastatic to lung [facility])    Hypertension 07/01/2021   Renal cell adenocarcinoma, left [facility])    Past Surgical History:  Procedure Laterality Date   APPENDECTOMY     BIOPSY  07/03/2021   Procedure: BIOPSY;  Surgeon: Ronnette Juniper, MD;  Location: [facility];  Service: Gastroenterology;;   CHEST TUBE INSERTION Left 02/06/2021   Procedure: INSERTION PLEURAL DRAINAGE CATHETER;  Surgeon: Garner Nash, DO;  Location: [place];  Service: Pulmonary;  Laterality: Left;   CHEST TUBE INSERTION N/A 04/04/2021   Procedure: removal of pleurex cath;  Surgeon: Garner Nash, DO;  Location: [place];  Service: Pulmonary;  Laterality: N/A;   ESOPHAGOGASTRODUODENOSCOPY (EGD) WITH PROPOFOL N/A 07/03/2021   Procedure: ESOPHAGOGASTRODUODENOSCOPY (EGD) WITH PROPOFOL;  Surgeon: Ronnette Juniper, MD;  Location:  [facility];  Service: Gastroenterology;  Laterality: N/A;   Social History:  reports that he has quit smoking. His smoking use included cigarettes. He smoked an average of .2 packs per day. He has never used smokeless tobacco. He reports that he  does not drink alcohol and does not use drugs.  No Known Allergies  Family History  Problem Relation Age of Onset   Lupus Mother    Cancer Other     Prior to Admission medications   Medication Sig Start Date End Date Taking? Authorizing Provider  axitinib (INLYTA) 5 MG tablet Take 1 tablet (5 mg total) by mouth daily. 02/08/22  Yes [name], MD  feeding supplement (ENSURE ENLIVE / ENSURE PLUS) LIQD Take 237 mLs by mouth 2 (two) times daily between meals. Patient taking differently: Take 237 mLs by mouth 2 (two) times daily as needed (nutrition). 01/26/22  Yes Swayze, Ava, DO  HYDROmorphone (DILAUDID) 4 MG tablet Take 1 tablet (4 mg total) by mouth every 4 (four) hours as needed for severe pain. 03/12/22  Yes Shadad, [name], MD  leptospermum manuka honey (MEDIHONEY) PSTE paste Apply 1 Application topically daily. 03/03/22  Yes Vann, Jessica U, DO  lidocaine (LIDODERM) 5 % Place 2 patches onto the skin daily. Remove & Discard patch within 12 hours or as directed by MD 01/26/22  Yes Swayze, Ava, DO  magnesium oxide (MAG-OX) 400 (240 Mg) MG tablet Take 1 tablet (400 mg total) by mouth at bedtime. 03/03/22  Yes Vann, Jessica U, DO  ondansetron (ZOFRAN) 4 MG tablet Take 1 tablet (4 mg total) by mouth 3 (three) times daily before meals. 03/03/22  Yes Vann, Jessica U, DO  SYMBICORT 80-4.5 MCG/ACT inhaler Inhale 2 puffs into the lungs daily as needed (for shortness of breath). 11/12/21  Yes [provider]  VENTOLIN HFA 108 (90 Base) MCG/ACT inhaler Inhale 2 puffs into the lungs every 4 (four) hours as needed for shortness of breath or wheezing. 09/11/21  Yes [provider]  promethazine (PHENERGAN) 25 MG suppository Place 1 suppository (25 mg total) rectally every 8 (eight) hours as needed for nausea or vomiting. Patient not taking: Reported on 03/13/2022 02/02/22   Wyvonnia Dusky, MD  promethazine (PHENERGAN) 25 MG tablet Take 1 tablet (25 mg total) by mouth every 8 (eight) hours as  needed for up to 12 doses for nausea or vomiting. Patient not taking: Reported on 03/13/2022 02/02/22   Wyvonnia Dusky, MD    Physical Exam: Vitals:   03/13/22 1400 03/13/22 1411 03/13/22 1415 03/13/22 1430  BP: 115/71  115/68 130/85  Pulse: (!) 113   (!) 110  Resp: (!) 24  (!) 21 (!) 23  Temp:  98.5 F (36.9 C)    TempSrc:  Oral    SpO2: 98%   100%   Physical Exam Vitals and nursing note reviewed.  Constitutional:      General: He is awake. He is not in acute distress.    Appearance: He is well-developed and normal weight. He is ill-appearing. He is not toxic-appearing.     Interventions: Nasal cannula in place.  HENT:     Head: Normocephalic.     Nose: No rhinorrhea.     Mouth/Throat:     Mouth: Mucous membranes are dry.  Eyes:     General: No scleral icterus.    Pupils: Pupils are equal, round, and reactive to light.  Neck:     Vascular: No JVD.  Cardiovascular:  Rate and Rhythm: Normal rate and regular rhythm.     Heart sounds: S1 normal and S2 normal.  Pulmonary:     Breath sounds: Examination of the right-lower field reveals decreased breath sounds and rales. Examination of the left-lower field reveals decreased breath sounds and rales. Decreased breath sounds, rhonchi and rales present.  Abdominal:     General: Bowel sounds are normal. There is no distension.     Palpations: Abdomen is soft.     Tenderness: There is no abdominal tenderness. There is no guarding.  Musculoskeletal:     Cervical back: Neck supple.     Right lower leg: No edema.     Left lower leg: No edema.  Skin:    General: Skin is warm and dry.  Neurological:     General: No focal deficit present.     Mental Status: He is alert and oriented to person, place, and time.  Psychiatric:        Mood and Affect: Mood normal.        Behavior: Behavior is cooperative.   Data Reviewed:  There are no new results to review at this time.  Assessment and Plan: Principal Problem:   HCAP  (healthcare-associated pneumonia) Admit to PCU/inpatient. Continue IV fluids. Nebulized Xopenex every 6 hours. Continue cefepime 2 g every 8 hours.   Continue metronidazole 500 mg IVPB q 12 hr. Continue vancomycin per pharmacy. Check sputum culture and sensitivity. Follow-up blood culture and sensitivity Follow CBC and CMP in a.m.  Active Problems:   Hypercalcemia of malignancy Received zoledronic acid. Add calcitonin SQ twice daily NS bolus 1000 mL x 2. Continue IV fluids. Furosemide as needed.    Hypophosphatemia In the setting of malignant hypercalcemia. Replacement ordered. Follow-up phosphorus level as needed.    Hyponatremia Has been drinking fluids but overall poor oral intake. Continue normal saline infusion. Follow-up sodium level.    Kidney cancer, primary,  with metastasis from kidney to other site [facility]) Lung and brain metastases. Poor prognosis. Stated he wants to be full code. Consult palliative care.    Essential hypertension Hold antihypertensives. Use as needed.    GERD (gastroesophageal reflux disease) Begin Protonix 40 mg p.o. daily.    Microcytic anemia Secondary to chronic disease. Monitor hematocrit and hemoglobin.    Advance Care Planning:   Code Status: Full Code   Consults: Palliative care consult requested.  Family Communication:   Severity of Illness: The appropriate patient status for this patient is INPATIENT. Inpatient status is judged to be reasonable and necessary in order to provide the required intensity of service to ensure the patient's safety. The patient's presenting symptoms, physical exam findings, and initial radiographic and laboratory data in the context of their chronic comorbidities is felt to place them at high risk for further clinical deterioration. Furthermore, it is not anticipated that the patient will be medically stable for discharge from the hospital within 2 midnights of admission.   * I certify that at the  point of admission it is my clinical judgment that the patient will require inpatient hospital care spanning beyond 2 midnights from the point of admission due to high intensity of service, high risk for further deterioration and high frequency of surveillance required.*  Author: Reubin Milan, MD 03/13/2022 3:08 PM  For on call review [email or web address].   This document was prepared using Dragon voice recognition software and may contain some unintended transcription errors.

## 2022-03-13 NOTE — Progress Notes (Signed)
A consult was received from an ED physician for vancomycin per pharmacy dosing.  The patient's profile has been reviewed for ht/wt/allergies/indication/available labs.    A one time order has been placed for vancomycin 1500 mg IV.    Further antibiotics/pharmacy consults should be ordered by admitting physician if indicated.                       Thank you, Suzzanne Cloud, PharmD, BCPS 03/13/2022  12:45 PM

## 2022-03-14 ENCOUNTER — Inpatient Hospital Stay (HOSPITAL_COMMUNITY): Payer: Medicaid Other

## 2022-03-14 ENCOUNTER — Inpatient Hospital Stay (HOSPITAL_COMMUNITY)
Admit: 2022-03-14 | Discharge: 2022-03-14 | Disposition: A | Discharge status: Home / Self Care | Payer: Medicaid Other | Attending: Family Medicine | Admitting: Family Medicine

## 2022-03-14 DIAGNOSIS — J189 Pneumonia, unspecified organism: Secondary | ICD-10-CM

## 2022-03-14 DIAGNOSIS — E871 Hypo-osmolality and hyponatremia: Secondary | ICD-10-CM

## 2022-03-14 DIAGNOSIS — R4781 Slurred speech: Secondary | ICD-10-CM

## 2022-03-14 DIAGNOSIS — C7951 Secondary malignant neoplasm of bone: Secondary | ICD-10-CM | POA: Diagnosis not present

## 2022-03-14 DIAGNOSIS — I10 Essential (primary) hypertension: Secondary | ICD-10-CM

## 2022-03-14 DIAGNOSIS — R4182 Altered mental status, unspecified: Secondary | ICD-10-CM | POA: Diagnosis not present

## 2022-03-14 DIAGNOSIS — C7801 Secondary malignant neoplasm of right lung: Secondary | ICD-10-CM

## 2022-03-14 DIAGNOSIS — G893 Neoplasm related pain (acute) (chronic): Secondary | ICD-10-CM | POA: Diagnosis not present

## 2022-03-14 LAB — MRSA NEXT GEN BY PCR, NASAL: MRSA by PCR Next Gen: NOT DETECTED

## 2022-03-14 LAB — COMPREHENSIVE METABOLIC PANEL WITH GFR
ALT: 7 U/L (ref 0–44)
AST: 17 U/L (ref 15–41)
Albumin: 2.9 g/dL — ABNORMAL LOW (ref 3.5–5.0)
Alkaline Phosphatase: 43 U/L (ref 38–126)
Anion gap: 7 (ref 5–15)
BUN: 6 mg/dL (ref 6–20)
CO2: 22 mmol/L (ref 22–32)
Calcium: 11.2 mg/dL — ABNORMAL HIGH (ref 8.9–10.3)
Chloride: 106 mmol/L (ref 98–111)
Creatinine, Ser: <0.3 mg/dL — ABNORMAL LOW (ref 0.61–1.24)
Glucose, Bld: 96 mg/dL (ref 70–99)
Potassium: 3.6 mmol/L (ref 3.5–5.1)
Sodium: 135 mmol/L (ref 135–145)
Total Bilirubin: 0.7 mg/dL (ref 0.3–1.2)
Total Protein: 6.4 g/dL — ABNORMAL LOW (ref 6.5–8.1)

## 2022-03-14 LAB — GLUCOSE, CAPILLARY: Glucose-Capillary: 84 mg/dL (ref 70–99)

## 2022-03-14 MED ORDER — SODIUM CHLORIDE 0.9 % IV SOLN
12.5000 mg | Freq: Four times a day (QID) | INTRAVENOUS | Status: DC | PRN
  Administered 2022-03-14: 12.5 mg via INTRAVENOUS
  Filled 2022-03-14: qty 12.5

## 2022-03-14 MED ORDER — LEVALBUTEROL HCL 1.25 MG/0.5ML IN NEBU
1.2500 mg | INHALATION_SOLUTION | Freq: Two times a day (BID) | RESPIRATORY_TRACT | Status: DC
Start: 2022-03-14 — End: 2022-03-16
  Administered 2022-03-14 – 2022-03-16 (×4): 1.25 mg via RESPIRATORY_TRACT
  Filled 2022-03-14 (×4): qty 0.5

## 2022-03-14 MED ORDER — SODIUM CHLORIDE 0.9 % IV SOLN: INTRAVENOUS | Status: DC

## 2022-03-14 MED ORDER — GADOBUTROL 1 MMOL/ML IV SOLN
6.0000 mL | Freq: Once | INTRAVENOUS | Status: AC | PRN
  Administered 2022-03-14: 6 mL via INTRAVENOUS

## 2022-03-14 MED ORDER — SODIUM CHLORIDE (PF) 0.9 % IJ SOLN
INTRAMUSCULAR | Status: AC
  Filled 2022-03-14: qty 50

## 2022-03-14 MED ORDER — IOHEXOL 350 MG/ML SOLN
75.0000 mL | Freq: Once | INTRAVENOUS | Status: AC | PRN
Start: 2022-03-14 — End: 2022-03-14
  Administered 2022-03-14: 75 mL via INTRAVENOUS

## 2022-03-14 NOTE — Progress Notes (Signed)
Dr. Joaquin Music on screen @ 1025 Back from CT @1044 

## 2022-03-14 NOTE — Progress Notes (Signed)
Patient noted to have slurred speech, disorientation, and difficulty swallowing upon medication administration. Girlfriend at bedside also endorses acute change from this morning. Communicated concerns to MD and rapid response and code stroke initiated.

## 2022-03-14 NOTE — Assessment & Plan Note (Addendum)
CT showed progressive lung nodules, new consolidation and effusion in left lower lung in setting of extensive known lung metastasis (and prior R mainstem obstruction) likely a more distal obstruction again.  Has not improved with antibiotics, suspect the overall worsening respiratory status is just advancing lung mets. - Continue vancomycin and cefepime for now as we arrange hospice - Okay to continue bronchodilators for comfort

## 2022-03-14 NOTE — Assessment & Plan Note (Signed)
BP normal not on meds

## 2022-03-14 NOTE — Hospital Course (Addendum)
Dylan Good is a 51 y.o. 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  In the ER, O2 normal.  Ca 14, other electrolytes unremarkable.  CTA chest showed no PE, but did show new left lower lobe infiltrates as well as known progressive innumerable small bilateral pulmonary nodules, bilateral pleural effusions with associated metastatic pleural nodules, and expansile lytic mass in the lateral left 4th, 5th and 10th ribs and very large heterogenous right kidney mass and bilateral adrenal masses.

## 2022-03-14 NOTE — Significant Event (Signed)
Rapid Response Event Note   Reason for Call :  New slurred speech and disorientation  Initial Focused Assessment:  Patient upright in bed, oriented to self. Poor attention/concentration, required multiple prompts to answer orientation questions. Speech slurred. Extraocular movements intact, face symmetrical, motor strength 5/5 bilateral upper and lower extremities w/ no drift.   Per bedside RN pt was treated for brain mets 1 year prior and never had follow-up scan. Pt girlfriend at bedside and reports patient has had similar episodes of confusion twice in the last month and a half.   Interventions:  CBG - 84 Code Stroke CT/CTA  Plan of Care:  Per Dr Rory Percy, MRI and EEG. Q4 neuro checks. Patient to remain on progressive unit.   Event Summary:   MD Notified: Dr. Loleta Books Call Time: [phone] Arrival Time: 1000 End Time: Allen  Kindsey Eblin, RN

## 2022-03-14 NOTE — Assessment & Plan Note (Addendum)
MRI brain and EEG normal.  Suspect this was from Phenergan.  Resolved today.

## 2022-03-14 NOTE — Progress Notes (Signed)
   03/13/22 2129  Assess: MEWS Score  Temp (!) 97.5 F (36.4 C)  BP 114/64  MAP (mmHg) 78  Pulse Rate (!) 118  Resp (!) 22  SpO2 100 %  Assess: MEWS Score  MEWS Temp 0  MEWS Systolic 0  MEWS Pulse 2  MEWS RR 1  MEWS LOC 0  MEWS Score 3  MEWS Score Color Yellow  Assess: if the MEWS score is Yellow or Red  Were vital signs taken at a resting state? Yes  Focused Assessment No change from prior assessment  Does the patient meet 2 or more of the SIRS criteria? Yes  MEWS guidelines implemented *See Row Information* Yes  Treat  MEWS Interventions Administered scheduled meds/treatments  Pain Scale 0-10  Pain Score 0  Take Vital Signs  Increase Vital Sign Frequency  Yellow: Q 2hr X 2 then Q 4hr X 2, if remains yellow, continue Q 4hrs  Notify: Charge Nurse/RN  Name of Charge Nurse/RN Notified Pam RN  Date Charge Nurse/RN Notified 03/13/22  Time Charge Nurse/RN Notified 2200  Notify: Provider  Provider Name/Title Olena Heckle NP  Date Provider Notified 03/13/22  Time Provider Notified 2300  Method of Notification Page  Notification Reason Other (Comment) (yellow mews)  Provider response No new orders  Date of Provider Response 03/13/22  Time of Provider Response 2353  Assess: SIRS CRITERIA  SIRS Temperature  0  SIRS Pulse 1  SIRS Respirations  1  SIRS WBC 1  SIRS Score Sum  3

## 2022-03-14 NOTE — Consult Note (Signed)
[facility] Telemedicine Consult   Requesting Provider: Dr Loleta Books Consult Participants: Dr. Jerelyn Charles, Telespecialist RN Raquel Sarna   Bedside RN Archer Asa Location of the provider:  [facility] Location of the patient: [facility]  This consult was provided via telemedicine with 2-way video and audio communication. The patient/family was informed that care would be provided in this way and agreed to receive care in this manner.    Chief Complaint: sudden onset of slurred speech, disorientation and difficulty swallowing  HPI:  Dylan Good is a 51/M with PMH of renal cell carcinoma medicine to the lung, and brain-hypertension, admitted to the hospital at [facility] long for progressive shortness of breath and pleuritic left chest wall pain.  He is being treated for HCAP as well as hypercalcemia.  Code stroke was activated today because of sudden onset of slurred speech, disorientation and dysphagia with a last known well at 7:30 AM.  Patient was given a dose of Phenergan for nausea and vomiting that he had been complaining of since shift change overnight. I evaluated and saw the patient on the camera. I also was able to speak with his girlfriend at the bedside.  She reports that he has been off-and-on acting altered for the past month or month and a half. Usually has altered consciousness where he does not act like himself and has some difficulty with speech lasts a day or less but this time around she feels that he has been off-and-on incoherent for over a week. She could not really tell me if he had a sudden change this morning but does report talking to him at 6:30 in the morning and he sounded reasonably normal.  Last year his head imaging had revealed a left temporal metastatic brain lesion that had resolved on follow-up imaging.    Past Medical History:  Diagnosis Date   Brain metastases 01/24/2021   Cancer, metastatic to lung [facility])    GERD (gastroesophageal reflux disease) 11/15/2021   Hypertension  07/01/2021   Renal cell adenocarcinoma, left (HCC)      Current Facility-Administered Medications:    0.9 %  sodium chloride infusion, , Intravenous, Continuous, Reubin Milan, MD, Last Rate: 100 mL/hr at 03/14/22 0047, New Bag at 03/14/22 0047   0.9 %  sodium chloride infusion, , Intravenous, Continuous, Danford, Suann Larry, MD   acetaminophen (TYLENOL) tablet 650 mg, 650 mg, Oral, Q6H PRN **OR** acetaminophen (TYLENOL) suppository 650 mg, 650 mg, Rectal, Q6H PRN, Reubin Milan, MD   calcitonin (MIACALCIN) injection 240 Units, 4 Units/kg, Subcutaneous, BID, Reubin Milan, MD, 240 Units at 03/14/22 0135   ceFEPIme (MAXIPIME) 2 g in sodium chloride 0.9 % 100 mL IVPB, 2 g, Intravenous, Q8H, Reubin Milan, MD, Last Rate: 200 mL/hr at 03/14/22 0533, 2 g at 03/14/22 0533   dextromethorphan-guaiFENesin (Gorst DM) 30-600 MG per 12 hr tablet 1 tablet, 1 tablet, Oral, BID, Reubin Milan, MD, 1 tablet at 03/14/22 0943   feeding supplement (ENSURE ENLIVE / ENSURE PLUS) liquid 237 mL, 237 mL, Oral, BID PRN, Reubin Milan, MD   HYDROmorphone (DILAUDID) tablet 4 mg, 4 mg, Oral, Q4H PRN, Reubin Milan, MD   levalbuterol [facility]) nebulizer solution 1.25 mg, 1.25 mg, Nebulization, BID, Danford, Suann Larry, MD   lidocaine (LIDODERM) 5 % 2 patch, 2 patch, Transdermal, Daily, Reubin Milan, MD, 2 patch at 03/14/22 0946   magnesium oxide (MAG-OX) tablet 400 mg, 400 mg, Oral, QHS, Reubin Milan, MD, 400 mg at 03/14/22 [phone]  metroNIDAZOLE (FLAGYL) IVPB 500 mg, 500 mg, Intravenous, BID, Reubin Milan, MD, Last Rate: 100 mL/hr at 03/14/22 0133, 500 mg at 03/14/22 0133   mometasone-formoterol (DULERA) 100-5 MCG/ACT inhaler 2 puff, 2 puff, Inhalation, BID, Reubin Milan, MD, 2 puff at 03/14/22 0820   ondansetron (ZOFRAN) tablet 4 mg, 4 mg, Oral, Q6H PRN **OR** ondansetron (ZOFRAN) injection 4 mg, 4 mg, Intravenous, Q6H PRN, Reubin Milan, MD,  4 mg at 03/14/22 0532   phosphorus (K PHOS NEUTRAL) tablet 500 mg, 500 mg, Oral, QID, Reubin Milan, MD, 500 mg at 03/14/22 0943   promethazine (PHENERGAN) 12.5 mg in sodium chloride 0.9 % 50 mL IVPB, 12.5 mg, Intravenous, Q6H PRN, Edwin Dada, MD, Last Rate: 200 mL/hr at 03/14/22 0901, 12.5 mg at 03/14/22 0901   sodium chloride (PF) 0.9 % injection, , , ,    vancomycin (VANCOREADY) IVPB 1750 mg/350 mL, 1,750 mg, Intravenous, Q24H, Suzzanne Cloud, RPH    LKW: 7:30 AM but the girlfriend reports that his mentation but has been off and on for a while-weeks to months tpa given?: No, nonfocal exam IR Thrombectomy? No, no LVO Modified Rankin Scale: 2-Slight disability-UNABLE to perform all activities but does not need assistance Time of teleneurologist evaluation: 10:26 AM  Exam: Vitals:   03/14/22 0825 03/14/22 1007  BP:  119/72  Pulse:  (!) 112  Resp:  20  Temp:  (!) 97.4 F (36.3 C)  SpO2: 98% 100%    General: Somewhat drowsy but able to participate in exam Neurologic exam Drowsy but participatory in exam.  Able to tell me his name, got his age and month wrong.  Definitely has dysarthria.  No evidence of aphasia.  Poor attention concentration.  Cranial nerves II to XII intact.  Motor examination with no drift in any of the 4 extremities.  Possible asterixis on outstretched arms.  Sensation exam difficult because he said that touch on both sides of his face and arm and leg did not feel the same but could not say which side felt abnormal.    NIHSS 1A: Level of Consciousness - 0 1B: Ask Month and Age - 2 1C: 'Blink Eyes' & 'Squeeze Hands' - 0 2: Test Horizontal Extraocular Movements - 0 3: Test Visual Fields - 0 4: Test Facial Palsy - 0 5A: Test Left Arm Motor Drift - 0 5B: Test Right Arm Motor Drift - 0 6A: Test Left Leg Motor Drift - 0 6B: Test Right Leg Motor Drift - 0 7: Test Limb Ataxia - 0 8: Test Sensation - 1 9: Test Language/Aphasia- 0 10: Test  Dysarthria - 1 11: Test Extinction/Inattention - 0 NIHSS score: 4   Imaging Reviewed: CT head with no acute changes.  CT angio head and neck with no emergent LVO.  Diminished caliber of the patent left M2's in comparison to the right M2 branches but no occlusion.  Pulmonary metastatic disease redemonstrated-see CTA chest for details.  Labs reviewed in epic and pertinent values follow: CBC    Component Value Date/Time   WBC 4.0 03/13/2022 0954   RBC 4.59 03/13/2022 0954   HGB 11.1 (L) 03/13/2022 0954   HGB 11.1 (L) 02/27/2022 1342   HCT 35.9 (L) 03/13/2022 0954   PLT 327 03/13/2022 0954   PLT 357 02/27/2022 1342   MCV 78.2 (L) 03/13/2022 0954   MCH 24.2 (L) 03/13/2022 0954   MCHC 30.9 03/13/2022 0954   RDW 15.9 (H) 03/13/2022 0954   LYMPHSABS 0.5 (L) 03/13/2022  0954   MONOABS 0.6 03/13/2022 0954   EOSABS 0.1 03/13/2022 0954   BASOSABS 0.0 03/13/2022 0954   CMP     Component Value Date/Time   NA 134 (L) 03/13/2022 2125   K 4.1 03/13/2022 2125   CL 106 03/13/2022 2125   CO2 20 (L) 03/13/2022 2125   GLUCOSE 88 03/13/2022 2125   BUN 8 03/13/2022 2125   CREATININE 0.42 (L) 03/13/2022 2125   CREATININE 0.75 02/27/2022 1342   CALCIUM 12.5 (H) 03/13/2022 2125   CALCIUM NOT PERFORMED 11/15/2021 1545   PROT 7.8 03/13/2022 0954   ALBUMIN 3.8 03/13/2022 0954   AST 19 03/13/2022 0954   AST 20 02/27/2022 1342   ALT 8 03/13/2022 0954   ALT 6 02/27/2022 1342   ALKPHOS 48 03/13/2022 0954   BILITOT 0.8 03/13/2022 0954   BILITOT 1.0 02/27/2022 1342   GFRNONAA >60 03/13/2022 2125   GFRNONAA >60 02/27/2022 1342     Assessment:  51 year old past medical history of renal carcinoma metastatic to double sites including brain, admitted for treatment of HCAP and hypercalcemia as well as altered mental status of multiple weeks duration with sudden change in his mentation-slurred speech, disorientation and difficulty swallowing. My exam as documented above is nonfocal Imaging does not  suggest an acute infarct on CT.  No LVO on vessel imaging. Symptoms likely secondary to medication administration-Phenergan was given a little prior to noticing these deficits. Other possible etiologies could be toxic metabolic encephalopathy in the setting of systemic illness as well as possible seizures because of his prior mental status in the left temporal lobe.   Recommendations:  MRI brain when able to EEG Correction of toxic metabolic derangements per primary team as you are Minimize sedating medications I would hold off on AEDs for now.  If the EEG shows any abnormality, might consider AEDs. Inpatient neurology team will follow with you upon completion of all the tests above.   Discussed with inpatient hospitalist Dr. Loleta Books.  -- Amie Portland, MD [facility] Pager: [phone] If 7pm to 7am, please call on call as listed on AMION.

## 2022-03-14 NOTE — Progress Notes (Signed)
[facility] [employer] [facility]) Hospital Liaison note:  This patient is currently enrolled in [facility] outpatient-based Palliative Care. Will continue to follow for disposition.  Please call with any outpatient palliative questions or concerns.  Thank you, Lorelee Market, LPN [facility] Liaison [phone]

## 2022-03-14 NOTE — Assessment & Plan Note (Signed)
Hgb stable, no clinical bleeding

## 2022-03-14 NOTE — Assessment & Plan Note (Signed)
-   Continue home PO dilaudid PRN, lidocaine patch

## 2022-03-14 NOTE — Evaluation (Signed)
SLP Cancellation Note  Patient Details Name: Dylan Good MRN: [id number] DOB: 27-Apr-1971   Cancelled treatment:       Reason Eval/Treat Not Completed: Other (comment) (pt is currently lethargic at this time; requested RN message this SLP if pt awakens adequately for po)  Kathleen Lime, MS [facility] SLP Acute Rehab Services Office [phone] Pager [phone]   [name] 03/14/2022, 11:57 AM

## 2022-03-14 NOTE — Progress Notes (Signed)
Called to bedside at 9:54a for change in patient status, new slurred speech.  Patient girlfriend arrived at bedside at 57:30a, felt he was talking normally.  She was present at bedside since then, states "about an hour later" he became altered. At 9:01a, patient had vomiting, nausea, given Phenergan IV 12.5 mg.  At 9:45a nursing returned to room, found patient with slurred speech, seemed dazed and confused.  No focal weakness.  BP 119/72   Pulse (!) 112   Temp (!) 97.4 F (36.3 C) (Oral)   Resp 20   Ht 5\' 9"  (1.753 m)   Wt 60 kg   SpO2 100%   BMI 19.53 kg/m   Pupils are equal.  Extraocular movements are intact, without nystagmus.  Cranial nerve 7 is symmetrical.  Cranial nerves 9-12 grossly normal.  Motor strength testing is 5/5 in the upper extremities bilaterally, lower extremities 5-/5 but symmetric.  Mucle bulk and tone very weak.   Sensory examination notable for "tingling" in left leg.  Speech is slurred, patient states he feels "a little" confused.   Attention span and concentration seem diminished.   Acute dysarthria and acute metabolic encephalopathy.  May be phenergan related.  Given known mets and ?new left leg paresthesia, need to rule out stroke. -Call [place] for CT head  -Will obtain MRI brain if normal

## 2022-03-14 NOTE — Assessment & Plan Note (Addendum)
Resolved

## 2022-03-14 NOTE — Assessment & Plan Note (Signed)
Supplemented 

## 2022-03-14 NOTE — Progress Notes (Signed)
EEG complete - results pending 

## 2022-03-14 NOTE — Progress Notes (Signed)
Progress Note   Patient: Dylan Good PFX:[id number] DOB: 1970-09-18 DOA: 03/13/2022     1 DOS: the patient was seen and examined on 03/14/2022 at 10:02AM      Brief hospital course: Dylan Good is a 51 y.o. 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  In the ER, O2 normal.  Ca 14, other electrolytes unremarkable.  CTA chest showed no PE, but did show new left lower lobe infiltrates as well as known progressive innumerable small bilateral pulmonary nodules, bilateral pleural effusions with associated metastatic pleural nodules, and expansile lytic mass in the lateral left 4th, 5th and 10th ribs and very large heterogenous right kidney mass and bilateral adrenal masses.     Assessment and Plan: * HCAP (healthcare-associated pneumonia) CT shows new left base infiltrate in setting of extensive known lung metastasis (and prior R mainstem obstruction) likely a more distal obstruction again. - Continue vancomycin and cefepime - Follow blood cultures - Obtain sputum culture  - Continue ICS/LABA and PRN bronchodilators  Malignant neoplasm metastatic to lung (HCC)    Pleural effusion on left    Slurred speech See prior note. - CT head - MRI brain if negative - Neuro checks  Microcytic anemia Hgb stable, no clinical bleeding  Hypophosphatemia Supplemented  Malnutrition of moderate degree    Essential hypertension BP normal not on meds  Hyponatremia Stable overnight - Continue IV fliuds  Hypercalcemia of malignancy CA improved overnight with fluids, Zometa, calcitonin. - Continue fluids, calcitonin  Cancer associated pain - Continue home PO dilaudid PRN, lidocaine patch  Kidney cancer, primary, with metastasis from kidney to other site [facility]) - Metastatic at diagnosis June 2022    - S/p SRS to brain mets - Had PleurX catheter from Aug 2022 to Sep 2022, since removed   - Had progression of disease on ipilimumab/nivolumab  - Had repeated hospitalizations with Cabometyx, so stopped Mar 2023 - Developed respiratory failure May 2023, found to have progressive lung mets, obstructed R bronchus, transferred to [facility], had palliative R mainstem bronchus stent placed - Discharged on Hospice from May to July 2023 - Signed off hospice around July and resumed care at [facility] - Treated for hypercalcemia and dehdyration Aug 8 and 10 - Start Inlyta Aug 10 - Hospitalized for dehydration/hypercalcemia again 8/23-8/27   - Hold Inlyta given active infection - Consult Pallaitive Care - I recommend Hospice          Subjective: The patient feels confused, he has new slurred speech, he has leg tingling.  He has no respiratory distress, fever, pain, vomiting.  He is somewhat confused and unable to articulate his prior left chest wall pain.     Physical Exam: BP 119/72   Pulse (!) 112   Temp (!) 97.4 F (36.3 C) (Oral)   Resp 20   Ht 5\' 9"  (1.753 m)   Wt 60 kg   SpO2 100%   BMI 19.53 kg/m   Cachectic appearing elderly adult male, lying in bed, appears weak and tired Tachycardic, regular, no murmurs, no peripheral edema Respiratory rate normal but shallow, lung sounds diminished Abdomen soft no tenderness palpation or guarding, no ascites or distention See neurology exam from note prior to this.  Data Reviewed: Code stroke called CT chest personally reviewed, shows small new consolidation in the left  lower lobe, numerous pulmonary nodules, left-sided pleural effusion Patient metabolic panel shows improving calcium, stable renal function, slightly improved sodium Hemogram shows hemoglobin 11, stable COVID test negative   Family Communication: Girlfriend at the bedside    Disposition: Status is: Inpatient         Author: Edwin Dada,  MD 03/14/2022 10:19 AM  For on call review [email or web address].

## 2022-03-14 NOTE — Procedures (Signed)
Patient Name: Dylan Good  MRN: [id number]  Epilepsy Attending: Lora Havens  Referring Physician/Provider: Amie Portland, MD  Date: 03/14/2022 Duration: 22.50 mins  Patient history: 51yo M with ams. EEG to evaluate for seizure.   Level of alertness: Awake  AEDs during EEG study: None  Technical aspects: This EEG study was done with scalp electrodes positioned according to the 10-20 International system of electrode placement. Electrical activity was reviewed with band pass filter of 1-70Hz , sensitivity of 7 uV/mm, display speed of 104mm/sec with a 60Hz  notched filter applied as appropriate. EEG data were recorded continuously and digitally stored.  Video monitoring was available and reviewed as appropriate.  Description: The posterior dominant rhythm consists of 7.5 Hz activity of moderate voltage (25-35 uV) seen predominantly in posterior head regions, symmetric and reactive to eye opening and eye closing. EEG showed intermittent generalized 2.5-3hz  delta slowing, at times with triphasic morphology. Hyperventilation and photic stimulation were not performed.     ABNORMALITY - Intermittent slow, generalized  IMPRESSION: This study is suggestive of mild to moderate diffuse encephalopathy, nonspecific etiology. No seizures or epileptiform discharges were seen throughout the recording.  Dylan Good

## 2022-03-14 NOTE — Assessment & Plan Note (Addendum)
Metastatic at diagnosis June 2022   S/p SRS to brain mets Had PleurX catheter from Aug 2022 to Sep 2022, since removed   Had progression of disease on ipilimumab/nivolumab  Had repeated hospitalizations with Cabometyx, so stopped Mar 2023 Developed respiratory failure May 2023, found to have progressive lung mets, obstructed R bronchus, transferred to [facility], had palliative R mainstem bronchus stent placed Discharged on Hospice from May to July 2023 Signed off hospice around July and resumed care at [facility] Treated for hypercalcemia and dehdyration Aug 8 and 10 Start Inlyta Aug 10 Hospitalized for dehydration/hypercalcemia again 8/23-8/27  Family meeting today, with Palliative Care.  Patient has deteriorated despite all aggressive cares.  The patient wishes to focus on comfort and not ceaseless medical tests and procedures. - Consult Hospice - Transition to full comfort focus and focus on quality of life

## 2022-03-14 NOTE — Assessment & Plan Note (Addendum)
CA improved  - Stop fluids, calcitonin

## 2022-03-14 NOTE — Consult Note (Signed)
Consultation Note Date: 03/14/2022   Patient Name: Dylan Good  DOB: Nov 10, 1970  MRN: [id number]  Age / Sex: 51 y.o., male  PCP: McVey, Gelene Mink, PA-C Referring Physician: Edwin Dada, *  Reason for Consultation: Establishing goals of care  HPI/Patient Profile: 51 y.o. 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  Patient is being treated for HCAP and hypercalcemia.  On morning of 9/7, patient had sudden onset of slurred speech with disorientation and dysphagia.  Code stroke was activated.  CT of head is negative.  EEG and MRI are pending.  PMT was consulted to discuss [place].    Clinical Assessment and Goals of Care: I have reviewed medical records including EPIC notes, labs and imaging, assessed the patient and then met with patient and his girlfriend Dylan Good at bedside to discuss diagnosis, prognosis, GOC, EOL wishes, disposition and options.  I introduced Palliative Medicine as specialized medical care for people living with serious illness. It focuses on providing relief from the symptoms and stress of a serious illness. The goal is to improve quality of life for both the patient and the family.  We discussed a brief life review of the patient.  Patient is originally from [place].  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  Deavin has 2 children of his own.  As far as functional and nutritional status Candace endorses patient had significant decline over the past few weeks.  She also shares she noticed changes in his mentation about a month and a half ago.  She shares she has been making him soups and encouraging increased p.o. intake without success.  We discussed patient's current illness  and what it means in the larger context of patient's on-going co-morbidities. I attempted to elicit values and goals of care important to the patient.  Patient said, "I am tired.  I am tired of struggling.  I am tired of hurting in my body.  I do not want to die and leave Dylan Good but I am just tired. " When asked what is most important to him at this time, patient stated, "Spend as much time with Dylan Good as possible."  The difference between aggressive medical intervention and comfort care was considered in light of the patient's goals of care. Patient was formerly patient with hospice ([facility]). Briefly described Hospice philosophy, hospice at home, and hospice inpatient facility.   Advance directives, concepts specific to code status, artificial feeding and hydration, and rehospitalization were considered and discussed.  Reviewed that during previous hospitalizations patient has elected a DNR for CODE STATUS.  When asked in the event of a cadriopulmonary arrest would want CPR/shocks or use of mechanical ventilation, pt said, "No". Confirmed DNR and allow a natural death in the event of arrest with both patient and Dylan Good. Code status changed to DNR.  Education offered regarding concept specific to human mortality and the limitations of medical interventions to prolong life when the body begins to fail to thrive.  Therapeutic silence and active listening provided for patient and Candace to share their thoughts and emotions regarding patient's current medical situation.  Candace asked that if he was able to eat and drink more with that "buy him more time".  Discussed functional, nutritional, and cognitive status as indicators of prognosis.   Discussed with patient/family the importance of continued conversation with family and the medical providers regarding overall plan of care and treatment options, ensuring decisions are within the context of the patient's values and GOCs.    Questions and  concerns were addressed.  Patient and Hal Hope would like to continue to discuss paitnet's plan of care and goals today. They are in agreement to meet with other family tomorrow, 9/8, at 10 AM, at bedside.  Dr. Loleta Books made aware and plans to participate as well.   [place] meeting for 9/8 at [place].   Primary Decision Maker PATIENT  Code Status/Advance Care Planning: DNR  Prognosis:   < 6 months  Discharge Planning: To Be Determined  Primary Diagnoses: Present on Admission:  HCAP (healthcare-associated pneumonia)  Hypophosphatemia  Kidney cancer, primary, with metastasis from kidney to other site [facility])  Hypercalcemia of malignancy  Essential hypertension  Hyponatremia  Microcytic anemia  Malnutrition of moderate degree  Malignant neoplasm metastatic to lung ([place])  Pleural effusion on left   Physical Exam Vitals reviewed.  Constitutional:      General: He is not in acute distress.    Appearance: He is not ill-appearing.  HENT:     Head:     Comments: Temporal wasting Cardiovascular:     Rate and Rhythm: Normal rate.  Pulmonary:     Effort: Pulmonary effort is normal.  Musculoskeletal:     Comments: Generalized weakness  Skin:    General: Skin is warm and dry.  Neurological:     Mental Status: He is alert and oriented to person, place, and time.  Psychiatric:        Mood and Affect: Mood normal. Mood is not anxious.        Behavior: Behavior normal. Behavior is not agitated.     Palliative Assessment/Data: 20%     Thank you for this consult. Palliative medicine will continue to follow and assist holistically.   Time Total: 75 minutes Greater than 50%  of this time was spent counseling and coordinating care related to the above assessment and plan.  Signed by: Jordan Hawks, DNP, FNP-BC Palliative Medicine    Please contact Palliative Medicine Team phone at [phone] for questions and concerns.  For individual provider: See Shea Evans

## 2022-03-14 NOTE — Evaluation (Signed)
Clinical/Bedside Swallow Evaluation Patient Details  Name: Dylan Good MRN: [id number] Date of Birth: [birth year]  Today's Date: 03/14/2022 Time: SLP Start Time (ACUTE ONLY): 0938 SLP Stop Time (ACUTE ONLY): 1829 SLP Time Calculation (min) (ACUTE ONLY): 25 min  Past Medical History:  Past Medical History:  Diagnosis Date   Brain metastases 01/24/2021   Cancer, metastatic to lung [facility])    GERD (gastroesophageal reflux disease) 11/15/2021   Hypertension 07/01/2021   Renal cell adenocarcinoma, left [facility])    Past Surgical History:  Past Surgical History:  Procedure Laterality Date   APPENDECTOMY     BIOPSY  07/03/2021   Procedure: BIOPSY;  Surgeon: Ronnette Juniper, MD;  Location: [facility] ENDOSCOPY;  Service: Gastroenterology;;   CHEST TUBE INSERTION Left 02/06/2021   Procedure: INSERTION PLEURAL DRAINAGE CATHETER;  Surgeon: Garner Nash, DO;  Location: [facility] ENDOSCOPY;  Service: Pulmonary;  Laterality: Left;   CHEST TUBE INSERTION N/A 04/04/2021   Procedure: removal of pleurex cath;  Surgeon: Garner Nash, DO;  Location: [facility];  Service: Pulmonary;  Laterality: N/A;   ESOPHAGOGASTRODUODENOSCOPY (EGD) WITH PROPOFOL N/A 07/03/2021   Procedure: ESOPHAGOGASTRODUODENOSCOPY (EGD) WITH PROPOFOL;  Surgeon: Ronnette Juniper, MD;  Location[facility];  Service: Gastroenterology;  Laterality: N/A;   HPI: Pt admitted with AMS - Per neuro notes, "Dylan Good is a 51/M with PMH of renal cell carcinoma with mets to the lung, and brain-hypertension, admitted to the hospital at [facility] long for progressive shortness of breath and pleuritic left chest wall pain.  He is being treated for HCAP as well as hypercalcemia.  Code stroke was activated today because of sudden onset of slurred speech, disorientation and dysphagia with a last known well at 7:30 AM.  Patient was given a dose of Phenergan for nausea and vomiting that he had been complaining of since shift change overnight." Prior brain MRI 01/11/2021 showed 5 mm  enhancing left temporal lobe lesion with mild edema most  compatible with a solitary metastasis.  05/2005 prior cervical spine testing states "There is a limbus vertebra at C-3, not a pathologic finding." Per neuro note:  Past medical history of renal carcinoma metastatic to double sites including brain, admitted for treatment of HCAP and hypercalcemia as well as altered mental status of multiple weeks duration with sudden change in his mentation-slurred speech, disorientation and difficulty swallowing.  My exam as documented above is nonfocal  Imaging does not suggest an acute infarct on CT.  No LVO on vessel imaging.  Symptoms likely secondary to medication administration-Phenergan was given a little prior to noticing these deficits.  Other possible etiologies could be toxic metabolic encephalopathy in the setting of systemic illness as well as possible seizures because of his prior mental status in the left temporal lobe."  Swallow eval ordered.      Assessment / Plan / Recommendation  Clinical Impression  Pt demonstrates clinical indications of dysphagia that is potentially multifactorial.  Oral suction set up and utilized to clear secretions from right sulci that pt did not swallow to clear and pt is dysarthric with imprecise articulation.  PO Intake included water, applejuice, applesauce and graham cracker bolus.  Baseline congested cough noted that increases in frequency with intake.  Pt describes signficiant chest discomfort on the left with cough and pain will likely inhibit adequate strength.  Provided pillow to maxmize comfort with cough.  No focal CN deficits present however pt with decreased phonatory strength - ? potential Recurrent Laryngeal Nerve involvement.    Pt admits  to dysphagia prior to admission - confirming coughing some with food and liquids and currently he wishes to have liquids.  Recommend pt be allowed clear liquids from floor stock, medications with applesauce and follow up next  date - 03/15/2022 after [place] meeting to determine if MBS is desired for advancement in diet as desired.  Pt endorses gustatory changes - that contribute to poor intake.  RN and pt educated to findings and recommendations - with agreement. SLP Visit Diagnosis: Dysphagia, unspecified (R13.10)    Aspiration Risk  Moderate aspiration risk    Diet Recommendation Thin liquid;Other (Comment) (floor stock)   Liquid Administration via: Cup;No straw Medication Administration: Whole meds with puree Supervision: Staff to assist with self feeding Compensations: Slow rate;Small sips/bites Postural Changes: Seated upright at 90 degrees;Remain upright for at least 30 minutes after po intake    Other  Recommendations Oral Care Recommendations: Oral care BID    Recommendations for follow up therapy are one component of a multi-disciplinary discharge planning process, led by the attending physician.  Recommendations may be updated based on patient status, additional functional criteria and insurance authorization.  Follow up Recommendations Follow physician's recommendations for discharge plan and follow up therapies      Assistance Recommended at Discharge Frequent or constant Supervision/Assistance  Functional Status Assessment Patient has had a recent decline in their functional status and demonstrates the ability to make significant improvements in function in a reasonable and predictable amount of time.  Frequency and Duration min 1 x/week  1 week       Prognosis Prognosis for Safe Diet Advancement: Fair Barriers to Reach Goals: Other (Comment);Time post onset (medical prognosis)      Swallow Study   General      Oral/Motor/Sensory Function Overall Oral Motor/Sensory Function: Within functional limits   Ice Chips Ice chips: Impaired Presentation: Spoon Pharyngeal Phase Impairments: Cough - Delayed   Thin Liquid Thin Liquid: Impaired Presentation: Cup;Straw;Self Fed Pharyngeal  Phase  Impairments: Cough - Immediate    Nectar Thick Nectar Thick Liquid: Not tested   Honey Thick Honey Thick Liquid: Not tested   Puree Puree: Impaired Presentation: Spoon Pharyngeal Phase Impairments: Cough - Delayed   Solid     Solid: Impaired Presentation: Self Fredirick Lathe 03/14/2022,5:16 PM Kathleen Lime, MS [facility] SLP Acute Rehab Services Office [phone] Pager [phone]

## 2022-03-14 NOTE — Progress Notes (Addendum)
TELESTROKE [place] @ 0254 YHCW @ 0830 Inpatient room #[id number] In CT @ 1018 Paged Amion TS Neurologist @ 1023 mRs 2

## 2022-03-15 ENCOUNTER — Ambulatory Visit: Payer: Medicaid Other | Admitting: Oncology

## 2022-03-15 ENCOUNTER — Other Ambulatory Visit: Payer: Medicaid Other

## 2022-03-15 DIAGNOSIS — Z515 Encounter for palliative care: Secondary | ICD-10-CM

## 2022-03-15 DIAGNOSIS — C7951 Secondary malignant neoplasm of bone: Secondary | ICD-10-CM

## 2022-03-15 DIAGNOSIS — J189 Pneumonia, unspecified organism: Secondary | ICD-10-CM | POA: Diagnosis not present

## 2022-03-15 DIAGNOSIS — C641 Malignant neoplasm of right kidney, except renal pelvis: Secondary | ICD-10-CM | POA: Diagnosis not present

## 2022-03-15 DIAGNOSIS — Z66 Do not resuscitate: Secondary | ICD-10-CM

## 2022-03-15 DIAGNOSIS — G893 Neoplasm related pain (acute) (chronic): Secondary | ICD-10-CM | POA: Diagnosis not present

## 2022-03-15 DIAGNOSIS — E876 Hypokalemia: Secondary | ICD-10-CM

## 2022-03-15 LAB — COMPREHENSIVE METABOLIC PANEL
ALT: 7 U/L (ref 0–44)
AST: 21 U/L (ref 15–41)
Albumin: 2.9 g/dL — ABNORMAL LOW (ref 3.5–5.0)
Alkaline Phosphatase: 39 U/L (ref 38–126)
Anion gap: 6 (ref 5–15)
BUN: <5 mg/dL — ABNORMAL LOW (ref 6–20)
CO2: 22 mmol/L (ref 22–32)
Calcium: 11.1 mg/dL — ABNORMAL HIGH (ref 8.9–10.3)
Chloride: 107 mmol/L (ref 98–111)
Creatinine, Ser: 0.41 mg/dL — ABNORMAL LOW (ref 0.61–1.24)
GFR, Estimated: >60 mL/min (ref >60)
Glucose, Bld: 86 mg/dL (ref 70–99)
Potassium: 3.3 mmol/L — ABNORMAL LOW (ref 3.5–5.1)
Sodium: 135 mmol/L (ref 135–145)
Total Bilirubin: 1.1 mg/dL (ref 0.3–1.2)
Total Protein: 6.2 g/dL — ABNORMAL LOW (ref 6.5–8.1)

## 2022-03-15 LAB — CBC
HCT: 29.7 % — ABNORMAL LOW (ref 39.0–52.0)
Hemoglobin: 9.3 g/dL — ABNORMAL LOW (ref 13.0–17.0)
MCH: 24.3 pg — ABNORMAL LOW (ref 26.0–34.0)
MCHC: 31.3 g/dL (ref 30.0–36.0)
MCV: 77.7 fL — ABNORMAL LOW (ref 80.0–100.0)
Platelets: 275 10*3/uL (ref 150–400)
RBC: 3.82 MIL/uL — ABNORMAL LOW (ref 4.22–5.81)
RDW: 15.9 % — ABNORMAL HIGH (ref 11.5–15.5)
WBC: 3.4 10*3/uL — ABNORMAL LOW (ref 4.0–10.5)
nRBC: 0 % (ref 0.0–0.2)

## 2022-03-15 LAB — CALCIUM, IONIZED: Calcium, Ionized, Serum: 6.9 mg/dL — ABNORMAL HIGH (ref 4.5–5.6)

## 2022-03-15 MED ORDER — POTASSIUM CHLORIDE CRYS ER 20 MEQ PO TBCR
40.0000 meq | EXTENDED_RELEASE_TABLET | Freq: Once | ORAL | Status: AC
  Administered 2022-03-15: 40 meq via ORAL
  Filled 2022-03-15: qty 2

## 2022-03-15 MED ORDER — DIAZEPAM 5 MG PO TABS
5.0000 mg | ORAL_TABLET | Freq: Three times a day (TID) | ORAL | Status: DC | PRN
  Administered 2022-03-15: 5 mg via ORAL
  Filled 2022-03-15: qty 1

## 2022-03-15 MED ORDER — MORPHINE SULFATE 10 MG/5ML PO SOLN
2.5000 mg | ORAL | Status: DC | PRN
  Administered 2022-03-15 – 2022-03-16 (×2): 2.5 mg via ORAL
  Filled 2022-03-15 (×2): qty 5

## 2022-03-15 MED ORDER — IPRATROPIUM-ALBUTEROL 0.5-2.5 (3) MG/3ML IN SOLN
3.0000 mL | RESPIRATORY_TRACT | Status: DC | PRN
  Administered 2022-03-15: 3 mL via RESPIRATORY_TRACT
  Filled 2022-03-15: qty 3

## 2022-03-15 NOTE — Progress Notes (Signed)
Mobility Specialist Cancellation/Refusal Note:   Reason for Cancellation/Refusal: Pt declined mobility at this time. Notified by nurse that he is not a good candidate for mobility at this time. Will check back as schedule permits.       [facility]

## 2022-03-15 NOTE — Progress Notes (Signed)
Palliative Care Progress Note, Assessment & Plan   Patient Name: Dylan Good       Date: 03/15/2022 DOB: June 14, 1971  Age: 51 y.o. MRN#: [id number] Attending Physician: Edwin Dada, * Primary Care Physician: Dorise Hiss, PA-C Admit Date: 03/13/2022  Reason for Consultation/Follow-up: Establishing goals of care  Subjective: Patient is lying in bed in no apparent distress.  He acknowledges my presence and is able to make his wishes known.  He denies pain or discomfort at this time.  He endorses he had a few bites of bacon and some broth for breakfast.  His girlfriend Candace and his brother Legrand Como are at bedside.  HPI: 51 y.o. 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   Patient is being treated for HCAP and hypercalcemia.  On morning of 9/7, patient had sudden onset of slurred speech with disorientation and dysphagia.  Code stroke was activated.  CT of head is negative.  EEG and MRI are pending.   PMT was consulted to discuss [place].  Summary of counseling/coordination of care: After reviewing the patient's chart and assessing the patient at bedside, Dr. Loleta Books and I met with patient and family present and spoke with patient's daughter Stanton Kidney via speaker phone to discuss goals of care, diagnosis, and prognosis.  Medical update given.  Reviewed aggressive nature of cancer and poor prognosis with limited expectancy.  Therapeutic silence, active listening, and emotional support provided.  I attempted to elicit goals and values important to the patient.  Patient shared that he would like to spend time with family and be at home.  Difference  between aggressive medical treatment and comfort care discussed.  Use of medications to minimize suffering and relief of pain, anxiety, and agitation discussed.  Patient in agreement to trial as needed's to better manage pain, work of breathing, and anxiety. Morphine 2.5 mg oral every 4 hours added by Dr. Loleta Books.  Patient in agreement to go home with hospice services at discharge.  TOC to offer choice of hospice agencies.  PMT will continue to follow the patient and support him and his family during his hospitalization.  Code Status: DNR  Prognosis: < 6 months  Discharge Planning: Home with Hospice  Physical Exam Constitutional:      General: He is not in acute distress. HENT:     Head:     Comments: Temporal wasting, frail, thin    Mouth/Throat:     Mouth: Mucous membranes are moist.  Cardiovascular:     Rate and Rhythm: Normal rate.     Pulses: Normal pulses.  Pulmonary:     Effort: Pulmonary effort is normal.  Musculoskeletal:     Comments: Generalized weakness  Neurological:     Mental Status: He is alert and oriented to person, place, and time.  Psychiatric:        Mood and Affect: Mood normal.        Behavior: Behavior normal.        Thought Content: Thought content normal.        Judgment: Judgment  normal.             Palliative Assessment/Data: 30%    Total Time 50 minutes  Greater than 50%  of this time was spent counseling and coordinating care related to the above assessment and plan.  Thank you for allowing the Palliative Medicine Team to assist in the care of this patient.  Semmes Ilsa Iha, FNP-BC Palliative Medicine Team Team Phone # [phone]

## 2022-03-15 NOTE — Plan of Care (Signed)
  Problem: Coping: Goal: Level of anxiety will decrease Outcome: Progressing   Problem: Pain Managment: Goal: General experience of comfort will improve Outcome: Progressing   Problem: Safety: Goal: Ability to remain free from injury will improve Outcome: Progressing   

## 2022-03-15 NOTE — Plan of Care (Signed)
EEG and MRI reviewed, negative   Discussed with Dr. Loleta Books who agrees this is a toxic/metabolic process  Will sign off at this time, please don't hesitate to reach out if neurology is needed.   Lesleigh Noe MD-PhD [facility] [phone] Available 7 AM to 7 PM, outside these hours please contact Neurologist on call listed on AMION   No charge plan of care note

## 2022-03-15 NOTE — Progress Notes (Signed)
MBS tentatively scheduled for 12:00 today. If [place] meeting results in decision to eat and drink regardless of risk and pt does not want to complete MBS, please secure chat me and I will cancel the MBS.   Herbie Baltimore, MA CCC-SLP  Acute Rehabilitation Services Secure Chat Preferred Office [phone]

## 2022-03-15 NOTE — Progress Notes (Signed)
[facility] [address] [employer] [facility]) Hospital Liaison note:  This patient is currently enrolled in [facility] outpatient-based Palliative Care. Will continue to follow for disposition.  Please call with any outpatient palliative questions or concerns.  Thank you, Lorelee Market, LPN [facility] Liaison [phone]

## 2022-03-15 NOTE — Progress Notes (Signed)
Progress Note   Patient: Dylan Good EGB:[id number] DOB: 1970/08/15 DOA: 03/13/2022     2 DOS: the patient was seen and examined on 03/15/2022        Brief hospital course: Dylan Good is a 51 y.o. 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  In the ER, O2 normal.  Ca 14, other electrolytes unremarkable.  CTA chest showed no PE, but did show new left lower lobe infiltrates as well as known progressive innumerable small bilateral pulmonary nodules, bilateral pleural effusions with associated metastatic pleural nodules, and expansile lytic mass in the lateral left 4th, 5th and 10th ribs and very large heterogenous right kidney mass and bilateral adrenal masses.     Assessment and Plan: * Malignant neoplasm metastatic to lung (HCC) Pleural effusion on left HCAP (healthcare-associated pneumonia) CT showed progressive lung nodules, new consolidation and effusion in left lower lung in setting of extensive known lung metastasis (and prior R mainstem obstruction) likely a more distal obstruction again.  Has not improved with antibiotics, suspect the overall worsening respiratory status is more due to advancing lung mets not infection.  - Continue vancomycin and cefepime for now as we arrange hospice - Okay to continue bronchodilators for comfort   Kidney cancer, primary, with metastasis from kidney to other site [facility]) Metastatic at diagnosis June 2022   S/p SRS to brain mets Had PleurX catheter from Aug 2022 to Sep 2022, since removed   Had progression of disease on ipilimumab/nivolumab  Had repeated hospitalizations with Cabometyx, so stopped Mar 2023 Developed respiratory failure May 2023, found to have progressive lung mets, obstructed R bronchus, transferred to [facility], had palliative R mainstem  bronchus stent placed Discharged on Hospice from May to July 2023 Signed off hospice around July and resumed care at [facility] Treated for hypercalcemia and dehdyration Aug 8 and 10 Start In[place]ta Aug 10 Hospitalized for dehydration/hypercalcemia again 8/23-8/27  Family meeting today, with Palliative Care.  Patient has deteriorated despite all aggressive cares.  The patient wishes to focus on comfort and not ceaseless medical tests and procedures. - Consult Hospice - Transition to full comfort focus and focus on quality of life    Slurred speech MRI brain and EEG normal.  Suspect this was from Phenergan.  Resolved today.          Hypokalemia - Supplement K  Microcytic anemia Hgb stable, no clinical bleeding  Hypophosphatemia Supplemented  Malnutrition of moderate degree   Essential hypertension BP normal not on meds  Hyponatremia Resolved  Hypercalcemia of malignancy CA improved  - Stop fluids, calcitonin  Cancer associated pain - Continue home PO dilaudid PRN, lidocaine patch            Subjective: Patient is tired, he has cough, his pain is relatively well controlled, no confusion, no fever, no focal weakness or numbness     Physical Exam: BP 112/68 (BP Location: Left Arm)   Pulse (!) 117   Temp 97.8 F (36.6 C) (Oral)   Resp 16   Ht 5\' 9"  (1.753 m)   Wt 60 kg   SpO2 96%   BMI 19.53 kg/m   Cachectic elderly male, lying in bed, no acute distress RRR, no murmurs, no peripheral edema Coarse breath sounds bilaterally, no wheezing Abdominal exam soft without tenderness palpation Attention normal,  affect blunted, judgment and insight appear normal  Data Reviewed: Discussed with palliative care Basic metabolic panel shows normal creatinine, calcium stable at 11, potassium slightly down to 3.3 MRI brain shows no mets, no stroke EEG showed nonspecific mild encephalopathy  Family Communication: Girlfriend, brother, at the bedside, daughter  by phone    Disposition: Status is: Inpatient         Author: Edwin Dada, MD 03/15/2022 4:27 PM  For on call review [email or web address].

## 2022-03-15 NOTE — Progress Notes (Signed)
   03/14/22 2142  Assess: MEWS Score  Temp 97.6 F (36.4 C)  BP 115/66  MAP (mmHg) 80  Pulse Rate (!) 124  ECG Heart Rate (!) 124  Resp (!) 26  SpO2 97 %  O2 Device Nasal Cannula  Assess: MEWS Score  MEWS Temp 0  MEWS Systolic 0  MEWS Pulse 2  MEWS RR 2  MEWS LOC 0  MEWS Score 4  MEWS Score Color Red  Assess: if the MEWS score is Yellow or Red  Were vital signs taken at a resting state? Yes  Focused Assessment No change from prior assessment  Does the patient meet 2 or more of the SIRS criteria? Yes  Does the patient have a confirmed or suspected source of infection? Yes  Provider and Rapid Response Notified? Yes  MEWS guidelines implemented *See Row Information* No, previously yellow, continue vital signs every 4 hours  Treat  Pain Scale 0-10  Pain Score 0  Take Vital Signs  Increase Vital Sign Frequency  Red: Q 1hr X 4 then Q 4hr X 4, if remains red, continue Q 4hrs  Escalate  MEWS: Escalate Red: discuss with charge nurse/RN and provider, consider discussing with RRT  Notify: Charge Nurse/RN  Name of Charge Nurse/RN Notified Hilda RN  Date Charge Nurse/RN Notified 03/14/22  Time Charge Nurse/RN Notified 2146  Notify: Provider  Provider Name/Title [place] Np  Date Provider Notified 03/14/22  Time Provider Notified 2147  Method of Notification Page  Notification Reason Other (Comment) (red mews, HR, RR)  Provider response No new orders  Assess: SIRS CRITERIA  SIRS Temperature  0  SIRS Pulse 1  SIRS Respirations  1  SIRS WBC 1  SIRS Score Sum  3

## 2022-03-15 NOTE — Assessment & Plan Note (Signed)
Resolved with supplementation and starting spironolactone. 

## 2022-03-16 ENCOUNTER — Other Ambulatory Visit: Payer: Self-pay | Admitting: Family Medicine

## 2022-03-16 ENCOUNTER — Inpatient Hospital Stay (HOSPITAL_COMMUNITY): Payer: Medicaid Other

## 2022-03-16 ENCOUNTER — Telehealth: Payer: Self-pay | Admitting: Family Medicine

## 2022-03-16 DIAGNOSIS — G893 Neoplasm related pain (acute) (chronic): Secondary | ICD-10-CM | POA: Diagnosis not present

## 2022-03-16 MED ORDER — HYDROCOD POLI-CHLORPHE POLI ER 10-8 MG/5ML PO SUER: 5.0000 mL | Freq: Two times a day (BID) | ORAL | Status: DC | PRN

## 2022-03-16 MED ORDER — DIAZEPAM 5 MG PO TABS
5.0000 mg | ORAL_TABLET | Freq: Three times a day (TID) | ORAL | 0 refills | Status: AC | PRN
Start: 2022-03-16 — End: ?

## 2022-03-16 MED ORDER — HYDROMORPHONE HCL 4 MG PO TABS: 4.0000 mg | ORAL_TABLET | ORAL | 0 refills | Status: AC | PRN

## 2022-03-16 MED ORDER — ACETAMINOPHEN 325 MG PO TABS: 650.0000 mg | ORAL_TABLET | Freq: Four times a day (QID) | ORAL | Status: AC | PRN

## 2022-03-16 MED ORDER — ONDANSETRON HCL 4 MG PO TABS: 4.0000 mg | ORAL_TABLET | Freq: Three times a day (TID) | ORAL | 0 refills | Status: AC

## 2022-03-16 MED ORDER — MORPHINE SULFATE 10 MG/5ML PO SOLN: 2.5000 mg | ORAL | 0 refills | Status: DC | PRN

## 2022-03-16 MED ORDER — IPRATROPIUM BROMIDE 0.02 % IN SOLN
0.5000 mg | RESPIRATORY_TRACT | Status: DC | PRN
  Administered 2022-03-16: 0.5 mg via RESPIRATORY_TRACT
  Filled 2022-03-16: qty 2.5

## 2022-03-16 MED ORDER — MORPHINE SULFATE 10 MG/5ML PO SOLN: 2.5000 mg | ORAL | 0 refills | Status: AC | PRN

## 2022-03-16 MED ORDER — IPRATROPIUM-ALBUTEROL 0.5-2.5 (3) MG/3ML IN SOLN: 3.0000 mL | RESPIRATORY_TRACT | 0 refills | Status: AC | PRN

## 2022-03-16 MED ORDER — LEVALBUTEROL HCL 0.63 MG/3ML IN NEBU
0.6300 mg | INHALATION_SOLUTION | Freq: Four times a day (QID) | RESPIRATORY_TRACT | Status: DC | PRN
Start: 2022-03-16 — End: 2022-03-16

## 2022-03-16 MED ORDER — HYDROCOD POLI-CHLORPHE POLI ER 10-8 MG/5ML PO SUER: 5.0000 mL | Freq: Two times a day (BID) | ORAL | 0 refills | Status: DC | PRN

## 2022-03-16 NOTE — Progress Notes (Signed)
[facility] Hospital liaison note  Received request from [facility] [place] LC/SW, for hospice services at home after discharge.   Patient is currently with our outpatient palliative team, and is now requesting to go home with hospice services.    Spoke with Mr. Deisher and his significant other, [name] to initiate education related to hospice philosophy, services, and team approach to care. Mr. Ballentine verbalized understanding of information given. Per discussion, the plan is for patient to discharge home today.   DME needs discussed.  Patient requests the following equipment for delivery: Bedside commode and over bed table   Address verified and is correct in the chart. [name] is the family member to contact to arrange time of equipment delivery.    Please send signed and completed DNR home with patient/family.   Please provide prescriptions at discharge as needed to ensure ongoing symptom management.    [facility] information and contact numbers given to family & above information shared with TOC.   Please call with any questions/concerns.    Thank you for the opportunity to participate in this patient's care.  [place]  [facility] Liaison  [phone]

## 2022-03-16 NOTE — Telephone Encounter (Signed)
Erroneous encounter

## 2022-03-16 NOTE — Progress Notes (Signed)
Spoke with Candace, he is home comfortable.  THey were not able to procure morphine 10/5 at [facility] as they were out of stock.  I spoke with [facility], who have 158ml in stock and would be affordable as cash.  Candace will use that, as we know he tolerated it well and it was effective in the hospital.  She won't fill the tussionex.  Hospice will take over tomorrow.

## 2022-03-16 NOTE — TOC Transition Note (Signed)
Transition of Care [facility]) - CM/SW Discharge Note   Patient Details  Name: Dylan Good MRN: [id number] Date of Birth: [birth year]  [facility]) CM/SW Contact:  Kimber Relic, LCSW Phone Number: 03/16/2022, 4:12 PM   Clinical Narrative:    Pt to be d/c home with Hospice. Pt has chosen [facility] as Agency. Pt and family reported to Nicholaus Corolla w/[facility] that they did not need any DME delivered prior to discharge. Dr. Loleta Books made aware.    Final next level of care: Home w Hospice Care Barriers to Discharge: No Barriers Identified   Patient Goals and CMS Choice Patient states their goals for this hospitalization and ongoing recovery are:: Return home w/hospice.   Choice offered to / list presented to : NA  Discharge Placement                       Discharge Plan and Services                DME Arranged: Bedside commode, Hospital bed DME Agency: Other - Comment Water quality scientist) Date DME Agency Contacted: 03/16/22 Time DME Agency Contacted: 1219 Representative spoke with at DME Agency: [place] ([place]) Interventions     Readmission Risk Interventions    01/24/2022    9:23 AM 10/05/2021   12:10 PM 09/14/2021   10:34 AM  Readmission Risk Prevention Plan  Transportation Screening Complete Complete Complete  PCP or Specialist Appt within 3-5 Days   Complete  HRI or [place]   Complete  Social Work Consult for [place] Planning/Counseling   Complete  Palliative Care Screening   Complete  Medication Review Press photographer) Complete Complete Complete  PCP or Specialist appointment within 3-5 days of discharge Complete Complete   HRI or [place] Complete Complete   SW Recovery Care/Counseling Consult  Complete   Palliative Care Screening Complete Complete   [place] Not Applicable Not Applicable

## 2022-03-16 NOTE — TOC Progression Note (Signed)
Transition of Care [facility]) - Progression Note    Patient Details  Name: Dylan Good MRN: [id number] Date of Birth: 06-28-1971  [facility] CM/SW Contact  Ross Ludwig, [place] Phone Number: 03/16/2022, 6:02 PM  Clinical Narrative:     CSW received phone call from attending physician, saying patient may need EMS transport home.  CSW spoke to bedside nurse and she said patient's girlfriend is planning to transport him.  CSW informed nurse that EMS Med Neccesity form is ready in case girlfriend changes her mind.  CSW provided phone number for EMS on secure chat with nurse.  TOC signing off.    Barriers to Discharge: No Barriers Identified  Expected Discharge Plan and Services           Expected Discharge Date: 03/16/22               DME Arranged: Bedside commode, Hospital bed DME Agency: Other - Comment Water quality scientist) Date DME Agency Contacted: 03/16/22 Time DME Agency Contacted: 1219 Representative spoke with at DME Agency: [place] ([place]) Interventions    Readmission Risk Interventions    01/24/2022    9:23 AM 10/05/2021   12:10 PM 09/14/2021   10:34 AM  Readmission Risk Prevention Plan  Transportation Screening Complete Complete Complete  PCP or Specialist Appt within 3-5 Days   Complete  HRI or [place]   Complete  Social Work Consult for [place] Planning/Counseling   Complete  Palliative Care Screening   Complete  Medication Review Press photographer) Complete Complete Complete  PCP or Specialist appointment within 3-5 days of discharge Complete Complete   HRI or Home Care Consult Complete Complete   SW Recovery Care/Counseling Consult  Complete   Palliative Care Screening Complete Complete   [place] Not Applicable Not Applicable

## 2022-03-16 NOTE — Discharge Summary (Signed)
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 Challis is a 51 y.o. 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  In the ER, O2 normal.  Ca 14, other electrolytes unremarkable.  CTA chest showed no PE, but did show new left lower lobe infiltrates as well as known progressive innumerable small bilateral pulmonary nodules, bilateral pleural effusions with associated metastatic pleural nodules, and expansile lytic mass in the lateral left 4th, 5th and 10th ribs and very large heterogenous right kidney mass and bilateral adrenal masses.  Renal cancer Metastatic at diagnosis June 2022   S/p SRS to brain mets Had PleurX catheter from Aug 2022 to Sep 2022, since removed   Had progression of disease on ipilimumab/nivolumab  Had repeated hospitalizations with Cabometyx, so stopped Mar 2023 Developed respiratory failure May 2023, found to have progressive lung mets, obstructed R bronchus, transferred to [facility], had  palliative R mainstem bronchus stent placed Discharged on Hospice from May to July 2023 Signed off hospice around July and resumed care at [facility] Treated for hypercalcemia and dehdyration Aug 8 and 10 Start Inlyta Aug 10 Hospitalized for dehydration/hypercalcemia again 8/23-8/27    * Malignant renal cell carcinoma, metastatic to the lungs Pleural effusion on left Possible healthcare associated pneumonia Kidney cancer, primary, with metastasis from kidney to other site  Kidney cancer, primary, with metastasis from kidney to other site On admission, CT showed progressive lung nodules, new consolidation and effusion in left lower lung in setting of extensive known lung metastasis (and prior R mainstem obstruction).  Started on cefepime, but had progressive worsening in the hospital.  Given that his prior bronchoscopy in May had shown numerous necrotic pulmonary nodules, and his imaging suggested progressive pulmonary metastasis and likely lymphangitic spread of tumor, goals of care were discussed frankly and patient expressed his wish to go home.  He was enrolled in Hospice and maximal medical therapy was provided with bronchodilators, morphine and Valium.  Family requested to discharge and work with [facility] as an outpatient to arrange home equipment.  I shared my concern that the patient's condition could deteriorate rapidly in the coming days.      Slurred speech due to metabolic encephalopathy due to medication Initial concern for stroke or hemorrhage or seizure.  MRI brain and EEG normal.  Suspect this was from Phenergan. Resolved.  Hypercalcemia of malignancy Treated with fluids, calcitonin, calcium normalized.             The [facility] Controlled Substances Registry was reviewed for  this patient prior to discharge.   Consultants: Oncology, Palliative Care   Disposition: Hospice care   DISCHARGE MEDICATION: Allergies as of 03/16/2022   No Known  Allergies      Medication List     STOP taking these medications    Inlyta 5 MG tablet Generic drug: axitinib       TAKE these medications    acetaminophen 325 MG tablet Commonly known as: TYLENOL Take 2 tablets (650 mg total) by mouth every 6 (six) hours as needed for mild pain (or Fever >/= 101).   diazepam 5 MG tablet Commonly known as: VALIUM Take 1 tablet (5 mg total) by mouth every 8 (eight) hours as needed for anxiety.   feeding supplement Liqd Take 237 mLs by mouth 2 (two) times daily between meals. What changed:  when to take this reasons to take this   HYDROmorphone 4 MG tablet Commonly known as: Dilaudid Take 1 tablet (4 mg total) by mouth every 4 (four) hours as needed for severe pain.   ipratropium-albuterol 0.5-2.5 (3) MG/3ML Soln Commonly known as: DUONEB Take 3 mLs by nebulization every 4 (four) hours as needed.   leptospermum manuka honey Pste paste Apply 1 Application topically daily.   lidocaine 5 % Commonly known as: LIDODERM Place 2 patches onto the skin daily. Remove & Discard patch within 12 hours or as directed by MD   magnesium oxide 400 (240 Mg) MG tablet Commonly known as: MAG-OX Take 1 tablet (400 mg total) by mouth at bedtime.   morphine 10 MG/5ML solution Take 1.3 mLs (2.6 mg total) by mouth every 4 (four) hours as needed (for cough or trouble breathing).   ondansetron 4 MG tablet Commonly known as: ZOFRAN Take 1 tablet (4 mg total) by mouth 3 (three) times daily before meals.   promethazine 25 MG suppository Commonly known as: PHENERGAN Place 1 suppository (25 mg total) rectally every 8 (eight) hours as needed for nausea or vomiting.   promethazine 25 MG tablet Commonly known as: PHENERGAN Take 1 tablet (25 mg total) by mouth every 8 (eight) hours as needed for up to 12 doses for nausea or vomiting.   Symbicort 80-4.5 MCG/ACT inhaler Generic drug: budesonide-formoterol Inhale 2 puffs into the lungs daily as needed (for  shortness of breath).   Ventolin HFA 108 (90 Base) MCG/ACT inhaler Generic drug: albuterol Inhale 2 puffs into the lungs every 4 (four) hours as needed for shortness of breath or wheezing.               Discharge Care Instructions  (From admission, onward)           Start     Ordered   03/16/22 0000  Discharge wound care:       Comments: For comfort   03/16/22 1646   03/16/22 0000  Discharge wound care:       Comments: For comfort   03/16/22 1646             Discharge Instructions     Discharge instructions   Complete by: As directed    For pain: Take hydromorphone/Dilaudid 4 mg up to every 6 hours   For cough or trouble breathing: Take morphine solution 2.5 mg (1.25 mL) up to every 2 hours as needed, or more frequently if directed by Hospice Take Duoneb nebulizers up to every 4 hours as needed   For anxiety: Take diazepam/Valium 5 mg up to every 6 hours    For nausea: Take ondansetron/Zofran  4 mg   Discharge wound care:   Complete by: As directed    For comfort   Discharge wound care:   Complete by: As directed    For comfort   Increase activity slowly   Complete by: As directed        Discharge Exam: Filed Weights   03/13/22 1600  Weight: 60 kg   See physical exam from earlier today: Cachectic adult male, lying in bed, appears debilitated and weak, interactive Tachycardic, no murmurs, no peripheral edema Breath sounds coarse and ragged bilaterally, wheezing bilaterally Abdominal exam scaphoid, soft, no tenderness palpation, no rigidity or distention Attention normal and responds appropriately to questions but is extremely weak, may be getting a little bit more disoriented.      Condition at discharge: worsening  The results of significant diagnostics from this hospitalization (including imaging, microbiology, ancillary and laboratory) are listed below for reference.   Imaging Studies: DG CHEST PORT 1 VIEW  Result Date:  03/16/2022 CLINICAL DATA:  Shortness of breath, decreasing O2 sats. EXAM: PORTABLE CHEST 1 VIEW COMPARISON:  Chest x-rays dated 03/13/2022 and 11/24/2021. Chest CT dated 03/13/2022 FINDINGS: Large LEFT chest wall mass appears stable. Small LEFT pleural effusion is not significantly changed. Persistently increased interstitial markings bilaterally, LEFT greater than RIGHT. The additional pulmonary metastases within the LEFT lower lung were better seen on the recent chest CT of 03/13/2022. Similarly, the numerous pulmonary nodules within the RIGHT lung were better demonstrated on chest CT. No new lung findings on today's exam. No pneumothorax is seen. IMPRESSION: 1. Stable chest x-ray.  No new lung findings. 2. Large LEFT chest wall mass appears stable. Small LEFT pleural effusion is stable. The bilateral pulmonary metastases are better seen on recent chest CT of 03/13/2022. 3. Persistently increased interstitial markings bilaterally, LEFT greater than RIGHT, compatible with lymphangitic spread of tumor versus interstitial edema. Electronically Signed   By: Franki Cabot M.D.   On: 03/16/2022 15:10   MR BRAIN W WO CONTRAST  Result Date: 03/14/2022 CLINICAL DATA:  Metastatic disease evaluation EXAM: MRI HEAD WITHOUT AND WITH CONTRAST TECHNIQUE: Multiplanar, multiecho pulse sequences of the brain and surrounding structures were obtained without and with intravenous contrast. CONTRAST:  77mL GADAVIST GADOBUTROL 1 MMOL/ML IV SOLN COMPARISON:  MRI 08/16/2021, correlation is also made with CT head 03/14/2022 FINDINGS: Brain: No abnormal parenchymal or meningeal enhancement. At the site of the previously treated left temporal lesion there is a small focus of hemosiderin deposition (series 10, image 26 and series 9, image 26), but no enhancement, edema, or residual lesion. No new lesions are seen. No restricted diffusion to suggest acute or subacute infarct. No acute hemorrhage, mass, mass effect, or midline shift. No  hydrocephalus or extra-axial collection. Vascular: Normal arterial flow voids. Skull and upper cervical spine: Normal marrow signal. Sinuses/Orbits: No acute finding. Other: The mastoids are well aerated. IMPRESSION: No acute intracranial process. No evidence of metastatic disease in the brain. No acute or subacute infarct. Electronically Signed   By: Merilyn Baba M.D.   On: 03/14/2022 19:25   EEG adult  Result Date: 03/14/2022 Lora Havens, MD     03/14/2022  4:38 PM Patient Name: SREEKAR BROYHILL MRN: [id number] Epilepsy Attending: Lora Havens Referring Physician/Provider: Amie Portland, MD Date: 03/14/2022 Duration: 22.50 mins Patient history: 51yo M with ams. EEG to evaluate for seizure. Level of alertness: Awake AEDs during EEG study: None Technical aspects: This EEG study was done with scalp electrodes positioned according to  the 10-20 International system of electrode placement. Electrical activity was reviewed with band pass filter of 1-70Hz , sensitivity of 7 uV/mm, display speed of 105mm/sec with a 60Hz  notched filter applied as appropriate. EEG data were recorded continuously and digitally stored.  Video monitoring was available and reviewed as appropriate. Description: The posterior dominant rhythm consists of 7.5 Hz activity of moderate voltage (25-35 uV) seen predominantly in posterior head regions, symmetric and reactive to eye opening and eye closing. EEG showed intermittent generalized 2.5-3hz  delta slowing, at times with triphasic morphology. Hyperventilation and photic stimulation were not performed.   ABNORMALITY - Intermittent slow, generalized IMPRESSION: This study is suggestive of mild to moderate diffuse encephalopathy, nonspecific etiology. No seizures or epileptiform discharges were seen throughout the recording. Priyanka Barbra Sarks   CT ANGIO HEAD NECK W WO CM (CODE STROKE)  Result Date: 03/14/2022 CLINICAL DATA:  Code stroke. Slurred speech and altered mental status. History of  metastatic disease to the brain. EXAM: CT ANGIOGRAPHY HEAD AND NECK TECHNIQUE: Multidetector CT imaging of the head and neck was performed using the standard protocol during bolus administration of intravenous contrast. Multiplanar CT image reconstructions and MIPs were obtained to evaluate the vascular anatomy. Carotid stenosis measurements (when applicable) are obtained utilizing NASCET criteria, using the distal internal carotid diameter as the denominator. RADIATION DOSE REDUCTION: This exam was performed according to the departmental dose-optimization program which includes automated exposure control, adjustment of the mA and/or kV according to patient size and/or use of iterative reconstruction technique. CONTRAST:  43mL OMNIPAQUE IOHEXOL 350 MG/ML SOLN COMPARISON:  Brain MRI 08/16/2021 FINDINGS: CT HEAD FINDINGS Brain: There is no acute intracranial hemorrhage, extra-axial fluid collection, or acute infarct. Parenchymal volume is normal. The ventricles are normal in size. Gray-white differentiation is preserved. No solid mass lesion is seen. The small treated left temporal lobe metastatic lesion seen on the prior MRI is not appreciated on the current study. There is no mass effect or midline shift. Vascular: No hyperdense vessel or unexpected calcification. Skull: Normal. Negative for fracture or focal lesion. Sinuses/Orbits: The imaged paranasal sinuses are clear. The globes and orbits are unremarkable. Other: None. ASPECTS [facility] Stroke Program Early CT Score) - Ganglionic level infarction (caudate, lentiform nuclei, internal capsule, insula, M1-M3 cortex): 7 - Supraganglionic infarction (M4-M6 cortex): 3 Total score (0-10 with 10 being normal): 10 CTA NECK FINDINGS Aortic arch: The imaged aortic arch is normal. The origins of the major branch vessels are patent. The subclavian arteries are patent to the level imaged. Right carotid system: The right common, internal, and external carotid arteries are  patent with minimal plaque at the bifurcation but no hemodynamically significant stenosis or occlusion. There is no dissection or aneurysm. Left carotid system: The left common, internal, and external carotid arteries are patent with minimal plaque at the bifurcation but no hemodynamically significant stenosis or occlusion. There is no dissection or aneurysm. Vertebral arteries: The vertebral arteries are patent, without hemodynamically significant stenosis or occlusion. There is no dissection or aneurysm. Skeleton: There is no acute osseous abnormality or suspicious osseous lesion. There is no visible canal hematoma. Other neck: The soft tissues of the neck are unremarkable. There is no pathologic lymphadenopathy in the neck. Upper chest: The lungs are assessed on the separately dictated CTA chest. Review of the MIP images confirms the above findings CTA HEAD FINDINGS Anterior circulation: The intracranial ICAs are patent. The bilateral MCAs are patent. The bilateral ACAs are patent. The anterior communicating artery is normal. There is no aneurysm  or AVM. Posterior circulation: The bilateral V4 segments are patent. The basilar artery is patent. The major cerebellar artery origins are patent. Diminutive posterior communicating arteries are seen bilaterally. The bilateral PCAs are patent. There is no aneurysm or AVM. Venous sinuses: Patent. Anatomic variants: None. Review of the MIP images confirms the above findings IMPRESSION: 1. No acute intracranial pathology. ASPECTS is 10 2. Patent vasculature of the head and neck with no hemodynamically significant stenosis, occlusion, or dissection. 3. Pulmonary metastatic disease assessed on the separately dictated CTA chest. Findings of the initial noncontrast head CT were paged via Amion to Dr. Loleta Books at 10:30 am. Electronically Signed   By: Valetta Mole M.D.   On: 03/14/2022 10:56   CT HEAD CODE STROKE WO CONTRAST  Result Date: 03/14/2022 CLINICAL DATA:  Code stroke.  Slurred speech and altered mental status. History of metastatic disease to the brain. EXAM: CT ANGIOGRAPHY HEAD AND NECK TECHNIQUE: Multidetector CT imaging of the head and neck was performed using the standard protocol during bolus administration of intravenous contrast. Multiplanar CT image reconstructions and MIPs were obtained to evaluate the vascular anatomy. Carotid stenosis measurements (when applicable) are obtained utilizing NASCET criteria, using the distal internal carotid diameter as the denominator. RADIATION DOSE REDUCTION: This exam was performed according to the departmental dose-optimization program which includes automated exposure control, adjustment of the mA and/or kV according to patient size and/or use of iterative reconstruction technique. COMPARISON:  Brain MRI 08/16/2021 FINDINGS: CT HEAD FINDINGS Brain: There is no acute intracranial hemorrhage, extra-axial fluid collection, or acute infarct. Parenchymal volume is normal. The ventricles are normal in size. Gray-white differentiation is preserved. No solid mass lesion is seen. The small treated left temporal lobe metastatic lesion seen on the prior MRI is not appreciated on the current study. There is no mass effect or midline shift. Vascular: No hyperdense vessel or unexpected calcification. Skull: Normal. Negative for fracture or focal lesion. Sinuses/Orbits: The imaged paranasal sinuses are clear. The globes and orbits are unremarkable. Other: None. ASPECTS [facility] Stroke Program Early CT Score) - Ganglionic level infarction (caudate, lentiform nuclei, internal capsule, insula, M1-M3 cortex): 7 - Supraganglionic infarction (M4-M6 cortex): 3 Total score (0-10 with 10 being normal): 10 CTA NECK FINDINGS Aortic arch: The imaged aortic arch is normal. The origins of the major branch vessels are patent. The subclavian arteries are patent to the level imaged. Right carotid system: The right common, internal, and external carotid arteries are  patent with minimal plaque at the bifurcation but no hemodynamically significant stenosis or occlusion. There is no dissection or aneurysm. Left carotid system: The left common, internal, and external carotid arteries are patent with minimal plaque at the bifurcation but no hemodynamically significant stenosis or occlusion. There is no dissection or aneurysm. Vertebral arteries: The vertebral arteries are patent, without hemodynamically significant stenosis or occlusion. There is no dissection or aneurysm. Skeleton: There is no acute osseous abnormality or suspicious osseous lesion. There is no visible canal hematoma. Other neck: The soft tissues of the neck are unremarkable. There is no pathologic lymphadenopathy in the neck. Upper chest: The lungs are assessed on the separately dictated CTA chest. Review of the MIP images confirms the above findings CTA HEAD FINDINGS Anterior circulation: The intracranial ICAs are patent. The bilateral MCAs are patent. The bilateral ACAs are patent. The anterior communicating artery is normal. There is no aneurysm or AVM. Posterior circulation: The bilateral V4 segments are patent. The basilar artery is patent. The major cerebellar artery origins are  patent. Diminutive posterior communicating arteries are seen bilaterally. The bilateral PCAs are patent. There is no aneurysm or AVM. Venous sinuses: Patent. Anatomic variants: None. Review of the MIP images confirms the above findings IMPRESSION: 1. No acute intracranial pathology.  ASPECTS is 10 2. Patent vasculature of the head and neck with no hemodynamically significant stenosis, occlusion, or dissection. 3. Pulmonary metastatic disease assessed on the separately dictated CTA chest. Findings of the initial noncontrast head CT were paged via Amion to Dr. Loleta Books at 10:30 am. Electronically Signed   By: Valetta Mole M.D.   On: 03/14/2022 10:50   CT Angio Chest PE W/Cm &/Or Wo Cm  Result Date: 03/13/2022 CLINICAL DATA:  PE  suspected, dyspnea, metastatic renal cell carcinoma * Tracking Code: BO * EXAM: CT ANGIOGRAPHY CHEST WITH CONTRAST TECHNIQUE: Multidetector CT imaging of the chest was performed using the standard protocol during bolus administration of intravenous contrast. Multiplanar CT image reconstructions and MIPs were obtained to evaluate the vascular anatomy. RADIATION DOSE REDUCTION: This exam was performed according to the departmental dose-optimization program which includes automated exposure control, adjustment of the mA and/or kV according to patient size and/or use of iterative reconstruction technique. CONTRAST:  32mL OMNIPAQUE IOHEXOL 350 MG/ML SOLN COMPARISON:  01/02/2022 FINDINGS: Cardiovascular: Satisfactory opacification of the pulmonary arteries to the segmental level. No evidence of pulmonary embolism. Normal heart size. No pericardial effusion. Mediastinum/Nodes: Similar matted appearing enlarged lymph nodes and soft tissue throughout the mediastinum and hila. Thyroid gland, trachea, and esophagus demonstrate no significant findings. Lungs/Pleura: Right mainstem bronchus stent unchanged. Multiple segmental bronchi are obstructed by nodules, particularly in the left lower lobe (series 7, image 82). Innumerable small bilateral pulmonary nodules, many of which are increased in size, for example in the inferior right upper lobe measuring 1.6 x 1.5 cm, previously 1.0 x 0.9 cm (series 7, image 54) and in the inferior lingula measuring 1.3 x 1.2 cm, previously 1.1 x 0.7 cm (series 7, image 101). Extensive new heterogeneous and consolidative airspace opacity in the peripheral left lower lobe (series 7, image 78). Small, left greater than right pleural effusions with numerous enhancing left-sided pleural nodules, similar to prior (series 5, image 134). Upper Abdomen: No acute abnormality. Partially imaged, very large, heterogeneous mass of the right kidney (series 5, image 170) as well as large bilateral adrenal  metastases. Musculoskeletal: Unchanged, large, expansile soft tissue mass arising from the lateral left fifth rib is unchanged, measuring 7.2 x 4.8 cm (series 5, image 76). Interval enlargement of a lytic soft tissue nodule of the posterior right tenth rib, measuring 2.7 x 2.0 cm, previously 1.7 x 1.1 cm (series 5, image 146). Interval enlargement of a lytic soft tissue nodule of the posterior right fourth rib measuring 2.2 x 1.4 cm, previously 1.2 x 0.8 cm (series 6, image 55). Review of the MIP images confirms the above findings. IMPRESSION: 1. Negative examination for pulmonary embolism. 2. Extensive new heterogeneous and consolidative airspace opacity in the peripheral left lower lobe, consistent with infection or aspiration. This may to some extent reflect postobstructive airspace disease given the occlusion of multiple segmental bronchi by pulmonary nodules in the central left lower lobe. 3. Innumerable small bilateral pulmonary nodules, many of which are increased in size, consistent with worsened pulmonary metastatic disease. 4. Small, left greater than right pleural effusions with numerous enhancing left-sided metastatic pleural nodules, similar to prior. 5. Unchanged large, expansile lytic mass arising from the lateral left fifth rib. There is however interval enlargement of lytic  soft tissue nodules of the posterior right fourth and tenth ribs, consistent with worsened osseous metastatic disease. 6. Partially imaged, very large, heterogeneous mass of the right kidney as well as large bilateral adrenal metastases. Electronically Signed   By: Delanna Ahmadi M.D.   On: 03/13/2022 12:13   DG Chest 2 View  Result Date: 03/13/2022 CLINICAL DATA:  Shortness of breath. History of renal cell carcinoma. EXAM: CHEST - 2 VIEW COMPARISON:  CT 01/02/2022, chest radiograph 11/24/2021 FINDINGS: Heart size is normal. Stable small left pleural effusion. No pneumothorax identified. Large left chest wall mass is identified  in appears similar to the previous exam from 11/24/2021. Multiple bilateral lung nodules are noted which subjectively appears similar to the exam from 01/02/2022. there is mild superimposed increase interstitial prominence which may reflect mild interstitial edema versus lymphangitic spread of disease. IMPRESSION: 1. Stable small left pleural effusion. 2. Mild increase interstitial prominence as described above. Differential considerations include lymphangitic spread of tumor versus mild interstitial edema 3. Stable large left chest wall mass edema and smaller bilateral pulmonary metastasis. This appears grossly unchanged from CT dated 01/02/2022. Electronically Signed   By: Kerby Moors M.D.   On: 03/13/2022 10:31   DG Abd Portable 1V  Result Date: 03/02/2022 CLINICAL DATA:  Diffuse abdominal pain.  Renal cell carcinoma EXAM: PORTABLE ABDOMEN - 1 VIEW COMPARISON:  CT 02/05/2022 FINDINGS: Nonobstructive bowel gas pattern. Calcifications in the right kidney compatible with intratumoral calcifications related to patient's known renal cell carcinoma. No gross free intraperitoneal air. Known lytic osseous metastatic lesions including the right tenth rib, L1, and left femoral neck are better seen by recent CT. IMPRESSION: Nonobstructive bowel gas pattern. Electronically Signed   By: Davina Poke D.O.   On: 03/02/2022 11:36    Microbiology: Results for orders placed or performed during the hospital encounter of 03/13/22  SARS Coronavirus 2 by RT PCR (hospital order, performed in [facility] hospital lab) *cepheid single result test* Anterior Nasal Swab     Status: None   Collection Time: 03/13/22  9:57 PM   Specimen: Anterior Nasal Swab  Result Value Ref Range Status   SARS Coronavirus 2 by RT PCR NEGATIVE NEGATIVE Final    Comment: (NOTE) SARS-CoV-2 target nucleic acids are NOT DETECTED.  The SARS-CoV-2 RNA is generally detectable in upper and lower respiratory specimens during the acute phase of  infection. The lowest concentration of SARS-CoV-2 viral copies this assay can detect is 250 copies / mL. A negative result does not preclude SARS-CoV-2 infection and should not be used as the sole basis for treatment or other patient management decisions.  A negative result may occur with improper specimen collection / handling, submission of specimen other than nasopharyngeal swab, presence of viral mutation(s) within the areas targeted by this assay, and inadequate number of viral copies (<250 copies / mL). A negative result must be combined with clinical observations, patient history, and epidemiological information.  Fact Sheet for Patients:   https://www.patel.info/  Fact Sheet for Healthcare Providers: https://hall.com/  This test is not yet approved or  cleared by the [place] FDA and has been authorized for detection and/or diagnosis of SARS-CoV-2 by FDA under an Emergency Use Authorization (EUA).  This EUA will remain in effect (meaning this test can be used) for the duration of the COVID-19 declaration under Section 564(b)(1) of the Act, 21 U.S.C. section 360bbb-3(b)(1), unless the authorization is terminated or revoked sooner.  Performed at [facility], [address]  [place], [place] [address]   MRSA Next Gen by PCR, Nasal     Status: None   Collection Time: 03/13/22 10:11 PM   Specimen: Nasal Mucosa; Nasal Swab  Result Value Ref Range Status   MRSA by PCR Next Gen NOT DETECTED NOT DETECTED Final    Comment: (NOTE) The GeneXpert MRSA Assay (FDA approved for NASAL specimens only), is one component of a comprehensive MRSA colonization surveillance program. It is not intended to diagnose MRSA infection nor to guide or monitor treatment for MRSA infections. Test performance is not FDA approved in patients less than 66 years old. Performed at [facility], [address], [place], [place] [address]     Labs: CBC: Recent Labs  Lab 03/13/22 0954 03/15/22 0917  WBC 4.0 3.4*  NEUTROABS 2.7  --   HGB 11.1* 9.3*  HCT 35.9* 29.7*  MCV 78.2* 77.7*  PLT 327 518   Basic Metabolic Panel: Recent Labs  Lab 03/13/22 0954 03/13/22 2125 03/14/22 1233 03/15/22 0536  NA 131* 134* 135 135  K 4.2 4.1 3.6 3.3*  CL 99 106 106 107  CO2 22 20* 22 22  GLUCOSE 116* 88 96 86  BUN 10 8 6  <5*  CREATININE 0.59* 0.42* <0.30* 0.41*  CALCIUM 14.0* 12.5* 11.2* 11.1*  MG 1.9  --   --   --   PHOS 1.8*  --   --   --    Liver Function Tests: Recent Labs  Lab 03/13/22 0954 03/14/22 1233 03/15/22 0536  AST 19 17 21   ALT 8 7 7   ALKPHOS 48 43 39  BILITOT 0.8 0.7 1.1  PROT 7.8 6.4* 6.2*  ALBUMIN 3.8 2.9* 2.9*   CBG: Recent Labs  Lab 03/14/22 0959  GLUCAP 84    Discharge time spent: approximately 45 minutes spent on discharge counseling, evaluation of patient on day of discharge, and coordination of discharge planning with nursing, social work, pharmacy and case management  Signed: Edwin Dada, MD [facility] 03/16/2022

## 2022-03-16 NOTE — Progress Notes (Signed)
  Progress Note   Patient: Dylan Good:[id number] DOB: 12/15/1970 DOA: 03/13/2022     3 DOS: the patient was seen and examined on 03/16/2022        Brief hospital course: Dylan Good is a 51 y.o. 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  In the ER, the patient was hypocalcemic, weak, chest imaging showed worsening lung involvement by his metastatic cancer.     Assessment and Plan: *Malignant renal cell carcinoma, metastatic to the lungs Pleural effusion on left Possible healthcare associated pneumonia Kidney cancer, primary, with metastasis from kidney to other site  See prior summary of his cancer  He has gotten steadily worse despite 4 days of cefepime.  Chest x-ray repeated today, shows no worsening of his pleural effusion, but does show his known bilateral pulmonary nodules and interstitial changes, which are probably lymphangitic spread of his tumor.  With his tachycardia, I do not think that diuresis is safe. - Okay to continue cefepime - Given that we are pursuing hospice, we are trying to maximize bronchodilators, morphine/Tussionex, Xanax for dyspnea    -Consult hospice   Sinus tachycardia and weakness Patient has marked weakness today, heart rate is rising, he is very weak.  Weaker, I am starting to be concerned that he may begin to deteriorate faster than he is able to get home.        Cancer associated pain - Continue home oral Dilaudid, lidocaine patch            Subjective: Patient has fairly severe cough, much weaker today than yesterday, no confusion, no fever, no hemoptysis.     Physical Exam: BP 111/66 (BP Location: Left Arm)   Pulse (!) 127   Temp 98.7 F (37.1 C) (Oral)   Resp 18   Ht 5\' 9"  (1.753 m)   Wt 60 kg   SpO2 95%   BMI 19.53  kg/m   Cachectic adult male, lying in bed, appears debilitated and weak, interactive Tachycardic, no murmurs, no peripheral edema Breath sounds coarse and ragged bilaterally, wheezing bilaterally Abdominal exam scaphoid, soft, no tenderness palpation, no rigidity or distention Attention normal and responds appropriately to questions but is extremely weak, may be getting a little bit more disoriented.     Data Reviewed: No new labs   Family Communication: Girlfriend by phone at the bedside    Disposition: Status is: Inpatient         Author: Edwin Dada, MD 03/16/2022 3:58 PM  For on call review [email or web address].

## 2022-03-16 NOTE — Progress Notes (Addendum)
This CSW contacted the pt's significant other, [name]. [name] reported they are in need of a bedside commode and hospital bed.   This CSW contacted Nicholaus Corolla, with [facility] to request that the pt is reviewed due to them wanting to d/c home with hospital. This CSW also informed Judson Roch of the family's medical equipment request: bedside commode, hospital bed. TOC following.   Addend @ 03/16/22 Sarah with [facility] informed that the family reported that they did not need any equipment before being discharged. This CSW will informed Dr. Loleta Books. TOC following.

## 2022-03-19 ENCOUNTER — Other Ambulatory Visit: Payer: Medicaid Other | Admitting: Internal Medicine

## 2022-04-05 ENCOUNTER — Other Ambulatory Visit (HOSPITAL_COMMUNITY): Payer: Self-pay

## 2022-04-07 DEATH — deceased

## 2022-07-19 ENCOUNTER — Other Ambulatory Visit (HOSPITAL_COMMUNITY): Payer: Self-pay
